# Patient Record
Sex: Female | Born: 1945
Health system: Southern US, Community
[De-identification: ages and names within clinical notes are randomized; demographics above are authoritative.]

## PROBLEM LIST (undated history)

## (undated) DIAGNOSIS — N3010 Interstitial cystitis (chronic) without hematuria: Secondary | ICD-10-CM

## (undated) DIAGNOSIS — I499 Cardiac arrhythmia, unspecified: Secondary | ICD-10-CM

## (undated) DIAGNOSIS — K5790 Diverticulosis of intestine, part unspecified, without perforation or abscess without bleeding: Secondary | ICD-10-CM

## (undated) DIAGNOSIS — I639 Cerebral infarction, unspecified: Secondary | ICD-10-CM

## (undated) DIAGNOSIS — T7840XA Allergy, unspecified, initial encounter: Secondary | ICD-10-CM

## (undated) DIAGNOSIS — R233 Spontaneous ecchymoses: Secondary | ICD-10-CM

## (undated) DIAGNOSIS — F909 Attention-deficit hyperactivity disorder, unspecified type: Secondary | ICD-10-CM

## (undated) DIAGNOSIS — IMO0001 Reserved for inherently not codable concepts without codable children: Secondary | ICD-10-CM

## (undated) DIAGNOSIS — J069 Acute upper respiratory infection, unspecified: Secondary | ICD-10-CM

## (undated) DIAGNOSIS — I313 Pericardial effusion (noninflammatory): Secondary | ICD-10-CM

## (undated) DIAGNOSIS — Z8719 Personal history of other diseases of the digestive system: Secondary | ICD-10-CM

## (undated) DIAGNOSIS — R112 Nausea with vomiting, unspecified: Secondary | ICD-10-CM

## (undated) DIAGNOSIS — Z9889 Other specified postprocedural states: Secondary | ICD-10-CM

## (undated) DIAGNOSIS — L253 Unspecified contact dermatitis due to other chemical products: Secondary | ICD-10-CM

## (undated) DIAGNOSIS — R0602 Shortness of breath: Secondary | ICD-10-CM

## (undated) DIAGNOSIS — G709 Myoneural disorder, unspecified: Secondary | ICD-10-CM

## (undated) DIAGNOSIS — T8859XA Other complications of anesthesia, initial encounter: Secondary | ICD-10-CM

## (undated) DIAGNOSIS — K222 Esophageal obstruction: Secondary | ICD-10-CM

## (undated) DIAGNOSIS — Z950 Presence of cardiac pacemaker: Secondary | ICD-10-CM

## (undated) DIAGNOSIS — M1990 Unspecified osteoarthritis, unspecified site: Secondary | ICD-10-CM

## (undated) DIAGNOSIS — T4145XA Adverse effect of unspecified anesthetic, initial encounter: Secondary | ICD-10-CM

## (undated) DIAGNOSIS — G909 Disorder of the autonomic nervous system, unspecified: Secondary | ICD-10-CM

## (undated) DIAGNOSIS — C50919 Malignant neoplasm of unspecified site of unspecified female breast: Secondary | ICD-10-CM

## (undated) DIAGNOSIS — R238 Other skin changes: Secondary | ICD-10-CM

## (undated) DIAGNOSIS — F329 Major depressive disorder, single episode, unspecified: Secondary | ICD-10-CM

## (undated) DIAGNOSIS — R079 Chest pain, unspecified: Secondary | ICD-10-CM

## (undated) DIAGNOSIS — K439 Ventral hernia without obstruction or gangrene: Secondary | ICD-10-CM

## (undated) DIAGNOSIS — Z5189 Encounter for other specified aftercare: Secondary | ICD-10-CM

## (undated) DIAGNOSIS — K219 Gastro-esophageal reflux disease without esophagitis: Secondary | ICD-10-CM

## (undated) DIAGNOSIS — F32A Depression, unspecified: Secondary | ICD-10-CM

## (undated) DIAGNOSIS — K2970 Gastritis, unspecified, without bleeding: Secondary | ICD-10-CM

## (undated) DIAGNOSIS — K226 Gastro-esophageal laceration-hemorrhage syndrome: Secondary | ICD-10-CM

## (undated) DIAGNOSIS — I3139 Other pericardial effusion (noninflammatory): Secondary | ICD-10-CM

## (undated) DIAGNOSIS — E039 Hypothyroidism, unspecified: Secondary | ICD-10-CM

## (undated) DIAGNOSIS — R001 Bradycardia, unspecified: Secondary | ICD-10-CM

## (undated) DIAGNOSIS — E079 Disorder of thyroid, unspecified: Secondary | ICD-10-CM

## (undated) HISTORY — PX: BLADDER SUSPENSION: SHX72

## (undated) HISTORY — DX: Malignant neoplasm of unspecified site of unspecified female breast: C50.919

## (undated) HISTORY — PX: BACK SURGERY: SHX140

## (undated) HISTORY — DX: Diverticulosis of intestine, part unspecified, without perforation or abscess without bleeding: K57.90

## (undated) HISTORY — DX: Depression, unspecified: F32.A

## (undated) HISTORY — DX: Reserved for inherently not codable concepts without codable children: IMO0001

## (undated) HISTORY — DX: Encounter for other specified aftercare: Z51.89

## (undated) HISTORY — DX: Allergy, unspecified, initial encounter: T78.40XA

## (undated) HISTORY — PX: PACEMAKER INSERTION: SHX728

## (undated) HISTORY — PX: NECK SURGERY: SHX720

## (undated) HISTORY — PX: TOTAL HIP ARTHROPLASTY: SHX124

## (undated) HISTORY — DX: Unspecified contact dermatitis due to other chemical products: L25.3

## (undated) HISTORY — DX: Gastritis, unspecified, without bleeding: K29.70

## (undated) HISTORY — DX: Disorder of thyroid, unspecified: E07.9

## (undated) HISTORY — DX: Gastro-esophageal reflux disease without esophagitis: K21.9

## (undated) HISTORY — DX: Interstitial cystitis (chronic) without hematuria: N30.10

## (undated) HISTORY — PX: ULNAR NERVE TRANSPOSITION: SHX2595

## (undated) HISTORY — PX: TONSILLECTOMY: SUR1361

## (undated) HISTORY — DX: Esophageal obstruction: K22.2

## (undated) HISTORY — PX: CYSTOSCOPY: SUR368

## (undated) HISTORY — DX: Unspecified osteoarthritis, unspecified site: M19.90

## (undated) HISTORY — PX: MASTECTOMY MODIFIED RADICAL: SUR848

## (undated) HISTORY — DX: Cerebral infarction, unspecified: I63.9

## (undated) HISTORY — PX: RECTOCELE REPAIR: SHX761

## (undated) HISTORY — DX: Gastro-esophageal laceration-hemorrhage syndrome: K22.6

## (undated) HISTORY — PX: EYE SURGERY: SHX253

## (undated) HISTORY — DX: Attention-deficit hyperactivity disorder, unspecified type: F90.9

## (undated) HISTORY — DX: Major depressive disorder, single episode, unspecified: F32.9

## (undated) HISTORY — DX: Ventral hernia without obstruction or gangrene: K43.9

---
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
## 1971-05-15 HISTORY — PX: LAPAROSCOPY: SHX197

## 1971-05-15 HISTORY — PX: TYMPANOPLASTY: SHX33

## 1972-05-14 HISTORY — PX: ABDOMINAL HYSTERECTOMY: SHX81

## 1980-05-14 HISTORY — PX: OOPHORECTOMY: SHX86

## 1999-07-06 ENCOUNTER — Other Ambulatory Visit: Admission: RE | Admit: 1999-07-06 | Discharge: 1999-07-06 | Payer: Self-pay | Admitting: *Deleted

## 2000-05-14 HISTORY — PX: BREAST BIOPSY: SHX20

## 2000-06-13 ENCOUNTER — Other Ambulatory Visit: Admission: RE | Admit: 2000-06-13 | Discharge: 2000-06-13 | Payer: Self-pay | Admitting: Internal Medicine

## 2000-06-13 ENCOUNTER — Encounter (INDEPENDENT_AMBULATORY_CARE_PROVIDER_SITE_OTHER): Payer: Self-pay | Admitting: Specialist

## 2000-07-10 ENCOUNTER — Other Ambulatory Visit: Admission: RE | Admit: 2000-07-10 | Discharge: 2000-07-10 | Payer: Self-pay | Admitting: *Deleted

## 2001-05-28 ENCOUNTER — Encounter: Payer: Self-pay | Admitting: Internal Medicine

## 2001-05-28 ENCOUNTER — Encounter: Admission: RE | Admit: 2001-05-28 | Discharge: 2001-05-28 | Payer: Self-pay | Admitting: Internal Medicine

## 2001-06-24 ENCOUNTER — Ambulatory Visit (HOSPITAL_COMMUNITY): Admission: RE | Admit: 2001-06-24 | Discharge: 2001-06-24 | Payer: Self-pay | Admitting: Internal Medicine

## 2001-08-18 ENCOUNTER — Other Ambulatory Visit: Admission: RE | Admit: 2001-08-18 | Discharge: 2001-08-18 | Payer: Self-pay | Admitting: *Deleted

## 2003-08-12 ENCOUNTER — Encounter: Admission: RE | Admit: 2003-08-12 | Discharge: 2003-11-10 | Payer: Self-pay | Admitting: Neurology

## 2003-11-11 ENCOUNTER — Encounter: Admission: RE | Admit: 2003-11-11 | Discharge: 2003-12-12 | Payer: Self-pay | Admitting: Neurology

## 2003-12-13 ENCOUNTER — Encounter: Admission: RE | Admit: 2003-12-13 | Discharge: 2004-03-12 | Payer: Self-pay | Admitting: Neurology

## 2005-02-12 ENCOUNTER — Ambulatory Visit: Payer: Self-pay | Admitting: Internal Medicine

## 2005-02-23 ENCOUNTER — Ambulatory Visit: Payer: Self-pay | Admitting: Internal Medicine

## 2005-05-28 ENCOUNTER — Ambulatory Visit (HOSPITAL_BASED_OUTPATIENT_CLINIC_OR_DEPARTMENT_OTHER): Admission: RE | Admit: 2005-05-28 | Discharge: 2005-05-28 | Payer: Self-pay | Admitting: Urology

## 2006-01-09 ENCOUNTER — Encounter: Admission: RE | Admit: 2006-01-09 | Discharge: 2006-01-09 | Payer: Self-pay | Admitting: Neurology

## 2006-02-11 ENCOUNTER — Encounter: Admission: RE | Admit: 2006-02-11 | Discharge: 2006-05-02 | Payer: Self-pay | Admitting: Neurology

## 2006-02-20 ENCOUNTER — Encounter: Admission: RE | Admit: 2006-02-20 | Discharge: 2006-02-20 | Payer: Self-pay | Admitting: Neurology

## 2006-03-29 ENCOUNTER — Encounter: Admission: RE | Admit: 2006-03-29 | Discharge: 2006-03-29 | Payer: Self-pay | Admitting: Neurology

## 2006-04-10 ENCOUNTER — Ambulatory Visit: Payer: Self-pay | Admitting: Internal Medicine

## 2006-05-17 ENCOUNTER — Encounter: Admission: RE | Admit: 2006-05-17 | Discharge: 2006-08-15 | Payer: Self-pay | Admitting: Neurology

## 2006-06-03 ENCOUNTER — Encounter: Admission: RE | Admit: 2006-06-03 | Discharge: 2006-06-03 | Payer: Self-pay | Admitting: Neurology

## 2006-07-18 ENCOUNTER — Inpatient Hospital Stay (HOSPITAL_COMMUNITY): Admission: RE | Admit: 2006-07-18 | Discharge: 2006-07-19 | Payer: Self-pay | Admitting: Neurological Surgery

## 2006-10-28 ENCOUNTER — Ambulatory Visit (HOSPITAL_COMMUNITY): Admission: RE | Admit: 2006-10-28 | Discharge: 2006-10-28 | Payer: Self-pay | Admitting: Neurological Surgery

## 2007-07-30 ENCOUNTER — Ambulatory Visit: Payer: Self-pay | Admitting: Internal Medicine

## 2007-08-19 ENCOUNTER — Ambulatory Visit: Payer: Self-pay | Admitting: Internal Medicine

## 2007-08-19 ENCOUNTER — Ambulatory Visit (HOSPITAL_COMMUNITY): Admission: RE | Admit: 2007-08-19 | Discharge: 2007-08-19 | Payer: Self-pay | Admitting: Internal Medicine

## 2007-08-19 ENCOUNTER — Encounter: Payer: Self-pay | Admitting: Internal Medicine

## 2007-08-21 ENCOUNTER — Ambulatory Visit: Payer: Self-pay | Admitting: Internal Medicine

## 2007-08-27 ENCOUNTER — Ambulatory Visit: Payer: Self-pay | Admitting: Cardiology

## 2007-12-05 ENCOUNTER — Encounter: Payer: Self-pay | Admitting: Internal Medicine

## 2009-02-09 ENCOUNTER — Emergency Department (HOSPITAL_COMMUNITY): Admission: EM | Admit: 2009-02-09 | Discharge: 2009-02-09 | Payer: Self-pay | Admitting: Emergency Medicine

## 2009-02-21 ENCOUNTER — Encounter: Payer: Self-pay | Admitting: Pulmonary Disease

## 2009-02-22 ENCOUNTER — Encounter: Payer: Self-pay | Admitting: Cardiology

## 2009-03-02 ENCOUNTER — Encounter: Payer: Self-pay | Admitting: Pulmonary Disease

## 2010-01-18 ENCOUNTER — Encounter (INDEPENDENT_AMBULATORY_CARE_PROVIDER_SITE_OTHER): Payer: Self-pay | Admitting: *Deleted

## 2010-03-03 ENCOUNTER — Encounter: Payer: Self-pay | Admitting: Pulmonary Disease

## 2010-04-25 ENCOUNTER — Encounter: Payer: Self-pay | Admitting: Pulmonary Disease

## 2010-05-05 DIAGNOSIS — M1990 Unspecified osteoarthritis, unspecified site: Secondary | ICD-10-CM | POA: Insufficient documentation

## 2010-05-05 DIAGNOSIS — M949 Disorder of cartilage, unspecified: Secondary | ICD-10-CM

## 2010-05-05 DIAGNOSIS — K449 Diaphragmatic hernia without obstruction or gangrene: Secondary | ICD-10-CM | POA: Insufficient documentation

## 2010-05-05 DIAGNOSIS — M899 Disorder of bone, unspecified: Secondary | ICD-10-CM | POA: Insufficient documentation

## 2010-05-05 DIAGNOSIS — C50919 Malignant neoplasm of unspecified site of unspecified female breast: Secondary | ICD-10-CM | POA: Insufficient documentation

## 2010-05-05 DIAGNOSIS — J45909 Unspecified asthma, uncomplicated: Secondary | ICD-10-CM | POA: Insufficient documentation

## 2010-05-09 ENCOUNTER — Ambulatory Visit: Admit: 2010-05-09 | Payer: Self-pay | Admitting: Pulmonary Disease

## 2010-05-09 ENCOUNTER — Encounter: Payer: Self-pay | Admitting: Pulmonary Disease

## 2010-05-09 ENCOUNTER — Ambulatory Visit: Payer: Self-pay | Admitting: Pulmonary Disease

## 2010-05-09 DIAGNOSIS — R0602 Shortness of breath: Secondary | ICD-10-CM | POA: Insufficient documentation

## 2010-05-10 ENCOUNTER — Ambulatory Visit: Payer: Self-pay | Admitting: Cardiology

## 2010-06-13 NOTE — Letter (Signed)
Summary: Colonoscopy Letter  [facility]  [address] [place], [place] [address]   Phone: [phone]  Fax: [phone]      January 18, 2010 MRN: [id number]   [facility] [address] [place], [place]  [address]   Dear Ms. Fisher,   According to your medical record, it is time for you to schedule a Colonoscopy. The American Cancer Society recommends this procedure as a method to detect early colon cancer. Patients with a family history of colon cancer, or a personal history of colon polyps or inflammatory bowel disease are at increased risk.  This letter has beeen generated based on the recommendations made at the time of your procedure. If you feel that in your particular situation this may no longer apply, please contact our office.  Please call our office at [phone] to schedule this appointment or to update your records at your earliest convenience.  Thank you for cooperating with [place] to provide you with the very best care possible.   Sincerely,  Hedwig Morton. Juanda Chance, M.D.  [facility] [phone]

## 2010-06-15 NOTE — Assessment & Plan Note (Signed)
Summary: asthma, sob/jd   Visit Type:  Initial Consult Copy to:  pcp Primary Provider/Referring Provider:  Dr. Geoffry Paradise  CC:  Pulmonary consult and SOB.  History of Present Illness: 65/F, never smoker for evaluation of dyspnea & asthma. She was working out with her trainer at [employer] when her  hr jumped to 160/m - nmlly low bp - seen by cards/ [place] - Evaluation incl nml holter, echo & stress nuclear study. She reached a HR of 135 - 80% max - but she feels that she was not exerted enough - she reports palpitations when she goes up a flight of stairs. She had an extensive WU for MS She received allergy shots  - kozlow x 5 y, nasonex, deviated septum, enlarged turbinates 'Asthma' was diagnosed as a young adult, appears mild 'intermittent ' nature- started as hives, never used epi pen, 'closing up' , relieved by maxair MDI , wheezing  on exposure to pine needles On  adderall 10 for ADHD. Spirometry did not show any airway obstruction. She did desaturate to 86% transiently on 3 rd lap around the office , Hr did not go up. I did not make her walk stairs due to her hip problem - ortho evaln ongoing.  Preventive Screening-Counseling & Management  Alcohol-Tobacco     Alcohol drinks/day: 2     Alcohol type: wine     Smoking Status: never  Current Medications (verified): 1)  Maxair Autohaler 200 Mcg/inh Aerb (Pirbuterol Acetate) .... As Directed As Needed 2)  Nasonex 50 Mcg/act Susp (Mometasone Furoate) .... 2 Sprays Each Nostril Once Daily 3)  Wellbutrin Xl 150 Mg Xr24h-Tab (Bupropion Hcl) .Marland Kitchen.. 1 Three Times A Day 4)  Maxzide-25 37.5-25 Mg Tabs (Triamterene-Hctz) .Marland Kitchen.. 1 Once Daily 5)  Adderall 20 Mg Tabs (Amphetamine-Dextroamphetamine) .... Take 1 Tablet By Mouth Once A Day 6)  Klonopin 1 Mg Tabs (Clonazepam) .... Take 1 Tablet By Mouth Three Times A Day 7)  Allegra Allergy 180 Mg Tabs (Fexofenadine Hcl) .... Take 1 Tablet By Mouth Once A Day 8)  Celexa 40 Mg Tabs (Citalopram  Hydrobromide) .... Take 1 Tablet By Mouth Once A Day 9)  Vivelle-Dot 0.025 Mg/24hr Pttw (Estradiol) .... Twice Weekly 10)  Estring 2 Mg Ring (Estradiol) .... Quarterly 11)  Nexium 40 Mg Cpdr (Esomeprazole Magnesium) .... Take 1 Capsule By Mouth Once A Day 12)  Synthroid 112 Mcg Tabs (Levothyroxine Sodium) .... Take 1 Tablet By Mouth Once A Day  Allergies (verified): 1)  ! Sulfa 2)  ! Cipro 3)  ! Macrodantin (Nitrofurantoin Macrocrystal) 4)  ! Codeine 5)  ! Demerol 6)  ! Pcn 7)  ! Morphine 8)  ! * Anesthesia  Past History:  Past Surgical History: Rectocele repair  Cystocele repair 1992 Hyterectomy  Oophorectomy 1982 Appendectomy Tubal ligation  Social History: Smoking Status:  never Alcohol drinks/day:  2  Review of Systems       The patient complains of shortness of breath with activity, acid heartburn, difficulty swallowing, nasal congestion/difficulty breathing through nose, and joint stiffness or pain.  The patient denies shortness of breath at rest, productive cough, non-productive cough, coughing up blood, chest pain, irregular heartbeats, indigestion, loss of appetite, weight change, abdominal pain, sore throat, tooth/dental problems, headaches, sneezing, itching, ear ache, anxiety, depression, hand/feet swelling, rash, change in color of mucus, and fever.    Vital Signs:  Patient profile:   65 year old female Height:      67 inches Weight:  140.8 pounds BMI:     22.13 O2 Sat:      100 % on Room air Temp:     97.7 degrees F oral Pulse rate:   74 / minute BP sitting:   98 / 52  (left arm) Cuff size:   regular  Vitals Entered By: Zackery Barefoot CMA (May 09, 2010 9:39 AM)  O2 Flow:  Room air  Serial Vital Signs/Assessments:  Comments: Ambulatory Pulse Oximetry  Resting; HR__75___    02 Sat__100%RA___  Lap1 (185 feet)   HR__92___   02 Sat__100%RA___ Lap2 (185 feet)   HR__94___   02 Sat__100%RA___    Lap3 (185 feet)   HR___95__   02  Sat__86%RA___  _x__Test Completed without Difficulty (Pt did desat to 86% RA at end of walk and recovered quickly to 100% RA once seated) Zackery Barefoot CMA  May 09, 2010 10:44 AM  ___Test Stopped due to:   By: Zackery Barefoot CMA   CC: Pulmonary consult, SOB Comments Medications reviewed with patient Verified contact number and pharmacy with patient Zackery Barefoot CMA  May 09, 2010 9:41 AM    Physical Exam  Additional Exam:  Gen. Pleasant, thin, in no distress, normal affect ENT - no lesions, no post nasal drip Neck: No JVD, no thyromegaly, no carotid bruits Lungs: no use of accessory muscles, no dullness to percussion, clear without rales or rhonchi  Cardiovascular: Rhythm regular, heart sounds  normal, no murmurs or gallops, no peripheral edema Abdomen: soft and non-tender, no hepatosplenomegaly, BS normal. Musculoskeletal: No deformities, no cyanosis or clubbing Neuro:  alert, non focal     Impression & Recommendations:  Problem # 1:  DYSPNEA (ICD-786.05) No obvious cause Cards evaln has been nml, spirometry nml Desaturation unexplained - very transient Due to her recent hip issues, will obtain CT angio to r/o PE although pre test probability is small, no other reason found. Orders: Consultation Level IV ([address]) Radiology Referral (Radiology)  Problem # 2:  ASTHMA (ICD-493.90) Mild intermittent No airway obstruction on spiroemtry today No reason to add inhaled steroids.  Medications Added to Medication List This Visit: 1)  Adderall 20 Mg Tabs (Amphetamine-dextroamphetamine) .... Take 1 tablet by mouth once a day 2)  Klonopin 1 Mg Tabs (Clonazepam) .... Take 1 tablet by mouth three times a day 3)  Allegra Allergy 180 Mg Tabs (Fexofenadine hcl) .... Take 1 tablet by mouth once a day 4)  Celexa 40 Mg Tabs (Citalopram hydrobromide) .... Take 1 tablet by mouth once a day 5)  Vivelle-dot 0.025 Mg/24hr Pttw (Estradiol) .... Twice weekly 6)  Estring 2 Mg  Ring (Estradiol) .... Quarterly 7)  Nexium 40 Mg Cpdr (Esomeprazole magnesium) .... Take 1 capsule by mouth once a day 8)  Synthroid 112 Mcg Tabs (Levothyroxine sodium) .... Take 1 tablet by mouth once a day  Other Orders: T-2 View CXR ([id number])  Patient Instructions: 1)  Copy sent to: dr aronson, [place] 2)  Please schedule a follow-up appointment as needed. 3)  Breathing test ok - no reason to increase asthma meds 4)  A Spiral CT of the Chest has been recommended.  Your imaging study may require preauthorization.    Immunization History:  Influenza Immunization History:    Influenza:  historical (03/20/2010)  Zostavax History:    Zostavax # 1:  zostavax (04/19/2008)     Appended Document: Orders Update     Clinical Lists Changes  Orders: Added new Service order of Spirometry w/Graph (94010) - Signed

## 2010-06-15 NOTE — Procedures (Signed)
Summary: CardioNet  CardioNet   Imported By: Sherian Rein 05/18/2010 13:57:21  _____________________________________________________________________  External Attachment:    Type:   Image     Comment:   External Document

## 2010-07-05 ENCOUNTER — Ambulatory Visit: Payer: Self-pay | Admitting: Pulmonary Disease

## 2010-07-27 ENCOUNTER — Ambulatory Visit (INDEPENDENT_AMBULATORY_CARE_PROVIDER_SITE_OTHER): Payer: Medicare Other | Admitting: Pulmonary Disease

## 2010-07-27 ENCOUNTER — Encounter: Payer: Self-pay | Admitting: Pulmonary Disease

## 2010-07-27 DIAGNOSIS — R0602 Shortness of breath: Secondary | ICD-10-CM

## 2010-07-27 DIAGNOSIS — J45909 Unspecified asthma, uncomplicated: Secondary | ICD-10-CM

## 2010-08-01 NOTE — Assessment & Plan Note (Signed)
Summary: 1 month f/u appt/mg   Visit Type:  Follow-up Copy to:  pcp Primary Provider/Referring Provider:  Dr. Geoffry Paradise  CC:  Pt is here for follow-up...still having SOB when exerting...she denies any cough or wheezing...c/o sinus drainage that is clear.  History of Present Illness: 65/F, never smoker for FU of dyspnea & asthma. She was working out with her trainer at [employer] when her  hr jumped to 160/m - nmlly low bp - seen by cards/ [place] - Evaluation incl nml holter, echo & stress nuclear study. She reached a HR of 135 - 80% max - but she feels that she was not exerted enough - she reports palpitations when she goes up a flight of stairs. She had an extensive WU for MS She received allergy shots  - kozlow x 5 y, nasonex, deviated septum, enlarged turbinates 'Asthma' was diagnosed as a young adult, appears mild 'intermittent ' nature- started as hives, never used epi pen, 'closing up' , relieved by maxair MDI , wheezing  on exposure to pine needles On  adderall 10 for ADHD. Spirometry did not show any airway obstruction. She did desaturate to 86% transiently on 3 rd lap around the office , Hr did not go up. I did not make her walk stairs due to her hip problem - ortho evaln ongoing.  July 27, 2010 4:03 PM  Hip is hurting again -time for cortisone shot Sister passed of pancreatic CA. Had episodes of dyspnea while climbing stairs. Not needed rescue inhaler too often. Hr did not change with walking around the office but went from 94 to 120 on lcimbing 1 flight of stairs & O2 satn did not change, she was symptomatic  Preventive Screening-Counseling & Management  Alcohol-Tobacco     Smoking Status: never  Current Medications (verified): 1)  Maxair Autohaler 200 Mcg/inh Aerb (Pirbuterol Acetate) .... As Directed As Needed 2)  Nasonex 50 Mcg/act Susp (Mometasone Furoate) .... 2 Sprays Each Nostril Once Daily 3)  Wellbutrin Xl 150 Mg Xr24h-Tab (Bupropion Hcl) .Marland Kitchen.. 1 Three Times A  Day 4)  Maxzide-25 37.5-25 Mg Tabs (Triamterene-Hctz) .Marland Kitchen.. 1 Once Daily 5)  Adderall 20 Mg Tabs (Amphetamine-Dextroamphetamine) .... Take 1 Tablet By Mouth Once A Day 6)  Klonopin 1 Mg Tabs (Clonazepam) .Marland Kitchen.. 1 By Mouth Daily 7)  Allegra Allergy 180 Mg Tabs (Fexofenadine Hcl) .... Take 1 Tablet By Mouth Once A Day 8)  Celexa 40 Mg Tabs (Citalopram Hydrobromide) .... Take 1 Tablet By Mouth Once A Day 9)  Vivelle-Dot 0.025 Mg/24hr Pttw (Estradiol) .... Twice Weekly 10)  Estring 2 Mg Ring (Estradiol) .... Quarterly 11)  Nexium 40 Mg Cpdr (Esomeprazole Magnesium) .... Take 1 Capsule By Mouth Once A Day 12)  Synthroid 112 Mcg Tabs (Levothyroxine Sodium) .... Take 1 Tablet By Mouth Once A Day  Allergies (verified): 1)  ! Sulfa 2)  ! Cipro 3)  ! Macrodantin (Nitrofurantoin Macrocrystal) 4)  ! Codeine 5)  ! Demerol 6)  ! Pcn 7)  ! Morphine 8)  ! 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 Michel Bickers CMA  July 27, 2010 3:42 PM   Physical Exam  Additional Exam:   Gen. Pleasant, thin, in no distress, normal affect ENT - no lesions, no post nasal drip Neck: No JVD, no thyromegaly, no carotid bruits Lungs: no use of accessory muscles, no dullness to percussion, clear without rales or rhonchi  Cardiovascular: Rhythm regular, heart sounds  normal, no murmurs or gallops, no peripheral edema Musculoskeletal: No deformities, no cyanosis or clubbing     CT of Chest  Procedure date:  05/10/2010  Findings:      IMPRESSION:  1. No evidence of pulmonary embolism. 2.  Probable atelectasis in the dependent right upper lobe. Minimal infection is felt less likely.  Correlate  with infectious symptoms.  Impression & Recommendations:  Problem # 1:  DYSPNEA (ICD-786.05)  Likely related to deconditioning with nml cardiopulmonary work -up Asthma does not appear to be worse, no evidence of PE Have encouraged to improve her conditioning  Orders: Est. Patient Level III (56213)  Problem # 2:  ASTHMA (ICD-493.90) rescue inhaler as needed only  Medications Added to Medication List This Visit: 1)  Klonopin 1 Mg Tabs (Clonazepam) .Marland Kitchen.. 1 by mouth daily  Patient Instructions: 1)  Copy sent to: Dr [name], Dr [place] 2)  Please schedule a follow-up appointment as needed. 3)  Your oxygen level did not drop while walking today & your heart rate went up while walking stairs - may indicate deconditioning 4)  No further tests or medications needed at thsi time  Appended Document: 1 month f/u appt/mg Ambulatory Pulse Oximetry  Resting; HR__76___    02 Sat__100%RA___  Lap1 (185 feet)   HR__92___   02 Sat__92%RA___ Lap2 (185 feet)   HR__97___   02 Sat__94%RA___    Lap3 (185 feet)   HR__94___   02 Sat__95%RA___  _x__Test Completed without Difficulty ___Test Stopped due to:  Resting O2-100%RA, P-86. Then walked pt up 2 flights of stairs, pt c/o extreme SOB and chest tightness, O2-99% P-120.

## 2010-08-18 LAB — CBC
Hemoglobin: 14.5 g/dL (ref 12.0–15.0)
MCHC: 34.7 g/dL (ref 30.0–36.0)
RBC: 4.45 MIL/uL (ref 3.87–5.11)
RDW: 13.3 % (ref 11.5–15.5)

## 2010-08-18 LAB — POCT CARDIAC MARKERS
CKMB, poc: 1.2 ng/mL (ref 1.0–8.0)
CKMB, poc: <1 ng/mL — ABNORMAL LOW (ref 1.0–8.0)
Myoglobin, poc: 175 ng/mL (ref 12–200)
Troponin i, poc: <0.05 ng/mL (ref 0.00–0.09)
Troponin i, poc: <0.05 ng/mL (ref 0.00–0.09)

## 2010-08-18 LAB — POCT I-STAT, CHEM 8
BUN: 15 mg/dL (ref 6–23)
Calcium, Ion: 1.05 mmol/L — ABNORMAL LOW (ref 1.12–1.32)
Chloride: 102 mEq/L (ref 96–112)
Glucose, Bld: 85 mg/dL (ref 70–99)
HCT: 45 % (ref 36.0–46.0)
Potassium: 3.7 mEq/L (ref 3.5–5.1)

## 2010-08-18 LAB — D-DIMER, QUANTITATIVE: D-Dimer, Quant: <0.22 ug/mL-FEU (ref 0.00–0.48)

## 2010-08-18 LAB — DIFFERENTIAL
Basophils Absolute: 0 10*3/uL (ref 0.0–0.1)
Basophils Relative: 0 % (ref 0–1)
Lymphocytes Relative: 29 % (ref 12–46)
Monocytes Absolute: 0.5 10*3/uL (ref 0.1–1.0)
Neutro Abs: 3.4 10*3/uL (ref 1.7–7.7)
Neutrophils Relative %: 62 % (ref 43–77)

## 2010-09-26 NOTE — Assessment & Plan Note (Signed)
[facility]                         GASTROENTEROLOGY OFFICE NOTE   NAME:STERNBERGJules, Vidovich                 MRN:          [id number]  DATE:07/30/2007                            DOB:          14-Dec-1945    Ms. Podgurski is a very nice 64 year old white female, whom we have  followed in the past for colorectal screening.  She has a positive  family history of colon cancer in her mother and has a personal history  of diverticulosis of the left colon.  Last colonoscopy was done in  October of 2006.  She has a history of breast cancer and  gastroesophageal reflux disease, as well.  Since last year, she has had  series of orthopedic problems, cervical laminectomy by Dr. Danielle Dess, and  most recently a spine injury, where she fractured the sacrum and was  diagnosed with degenerative joint disease of the lumbosacral spine.  She  has been in a lot of pain and has complained of increased pain in the  left upper quadrant and epigastrium from taking ibuprofen 800 mg twice a  day.  She denies any vomiting, but has had some solid-food dysphagia.  She has pain with eating and early satiety.  Last upper endoscopy was  done in October 2006, prior endoscopies in February 2001 and February  2003, which showed mild esophageal stricture.  She had to be dilated  with [facility] dilator to #48 [place].   MEDICATIONS:  1. Nexium 40 mg p.o. b.i.d.  2. Librax p.r.n.  This seemed to relieve some of her discomfort.  3. Restasis.  4. Allergy shots.  5. Vivelle patch.  6. Elmiron.  7. Fexofenadine 180 mg daily.  8. Levothyroxine 88 mcg daily.  9. Adderall XR 10 mg daily.  10.Celexa 40 mg daily.  11.Clonazepam 0.5 mg a half tablet t.i.d.  12.She is on Budeprion XL 150 mg one or two a day.  13.Triamterene 37.5/25 daily.  14.Gabapentin 300 mg q.h.s.  15.Estrin.  16.Vitamin D.   PHYSICAL EXAM:  Blood pressure 110/68, pulse 70 and weight 141 pounds.  Prior was 149 pounds.  She  is alert and oriented, in no distress.  LUNGS:  With normal breath sounds.  COR:  With quiet precordium, normal S1, normal S2.  ABDOMEN:  Soft, but very tender in epigastrium and to the left of the  epigastrium, along the left costal margin.  Bowel sounds were  normoactive.  There was no succussion splash.  Lower abdomen was normal.  She had tenderness in right sacral area.   IMPRESSION:  A 65 year old white female with exacerbation of  gastroesophageal reflux and abdominal pain, as a result of having series  of orthopedic injuries and surgeries, necessitating use of NSAIDs, as  well as patient's having to lie down and aggravating her reflux.  We  need to rule out possibility of gastric outlet obstruction, causing her  early satiety, or possibly NSAID-related ulceration.  Her dysphagia may  be due to progressive benign distal esophageal stricture.   PLAN:  1. Add Carafate slurry 10 mL q.i.d.  2. Cut back on the ibuprofen.  3. Upper endoscopy scheduled.  4.  Continue Nexium 40 mg p.o. b.i.d.  Samples given.   Prescriptions sent electronically to [facility].     Hedwig Morton. Juanda Chance, MD  Electronically Signed    DMB/MedQ  DD: 07/30/2007  DT: 07/30/2007  Job #: [id number]   cc:   Stefani Dama, M.D.  Geoffry Paradise, M.D.

## 2010-09-29 NOTE — Op Note (Signed)
NAMESECILY, WALTHOUR          ACCOUNT NO.:  [id number]   MEDICAL RECORD NO.:  [id number]          PATIENT TYPE:  INP   LOCATION:  3172                         FACILITY:  [facility]   PHYSICIAN:  Stefani Dama, M.D.  DATE OF BIRTH:  May 03, 1946   DATE OF PROCEDURE:  07/18/2006  DATE OF DISCHARGE:                               OPERATIVE REPORT   PREOPERATIVE DIAGNOSIS:  Cervical spondylosis C5-C6, with herniated  nucleus pulposus, spondylosis C6-C7 with left cervical radiculopathy, C6-  C7 distributions.   POSTOPERATIVE DIAGNOSIS:  Cervical spondylosis C5-C6, with herniated  nucleus pulposus, spondylosis C6-C7 with left cervical radiculopathy, C6-  C7 distributions.   PROCEDURES:  Anterior cervical decompression C5-6, C6-7; arthrodesis  with structural allograft; anterior plate fixation with Alphatec plate,  Z6-X0.   SURGEON:  Stefani Dama, M.D.   FIRST ASSISTANT:  Dr. Delma Officer.   ANESTHESIA:  General endotracheal.   INDICATIONS:  [name] a 65 year old individual who has had  significant neck and right shoulder and arm pain with weakness in the  biceps and triceps groups.  She also has weakness in her grip strength  on the right side.  She has profound spondylitic changes at C6-C7.  She  also has a spondylitic change with herniated nucleus pulposus noted at  C5-C6.  She had been advised regarding surgical decompression.   PROCEDURE:  The patient was brought to the operating room, placed on  table in supine position.  After smooth induction of general  endotracheal anesthesia, she was placed in 5 pounds of halter traction,  neck was prepped with DuraPrep and draped in sterile fashion.  A  transverse incision was made in the left side of neck and carried down  through the platysma.  The plane between the sternocleidomastoid and  strap muscles dissected bluntly until the prevertebral space was  reached.  The first identifiable disk space was noted to be C5-C6  on  localizing radiograph.  Then by stripping the longus coli muscle off the  ventral aspect the vertebral bodies across line of levels of C5-6 and C6-  7, the Caspar retractor was placed into the wound to maintain exposure.  The anterior longitudinal ligament at C5-6 was opened with a #15 blade  and a combination of curettes and rongeurs was used to evacuate a  significant quantity of degenerated disk material at both C5-6 and C6-7,  ventral osteophytes were came encountered at C5-6 and these were taken  down with a Behr rongeur and also with a Leksell rongeur.  A high-speed  drill was then used to evacuate the bony overgrowth and in the  interspace.  A self-retaining spreader was then placed in the wound to  allow better exposure of the posterior structures.  Significant  osteophyte from the inferior margin of body of C5 was encountered.  This  was drilled down with high-speed drill and 2.3-mm dissecting tool.  The  uncinate processes were hypertrophied particularly on the right side.  At C5-6 there was a combination of bony osteophytosis and disk  protrusion in the subligamentous space that was causing significant  foraminal stenosis.  This was decompressed to  allow easy visualization  and outflow of the C6 nerve root.  The left side was similarly  decompressed; however, here significant foraminotomy was not created.  The endplates were then drilled down smooth.  A similar procedure was  carried out at C6-C7, however osteophytosis was the main finding at the  right side.  Then bone grafts were prepared these were 8 mm TranZgrafts  that were shaved down to appropriate size and shape.  They were hollowed  out slightly and filled with demineralized bone matrix.  Allograft was  then placed into the interspace at C5-6, countersunk slightly. C6-7 had  a similar allograft placed.  This was also countersunk.  Then a 34 mm  standard size Alphatec plate was fitted to the ventral aspect of the   vertebral bodies with fixed angle locking screws measuring 4 x 14 mm in  C6, variable angle screws were put in C5 and C7 a final localizing  radiograph identified good fixation.  Ventral aspect of the wound was  irrigated copiously with antibiotic irrigating solution.  Hemostasis was  established well and then the platysma was closed 3-0 Vicryl interrupted  fashion, 2-0 Vicryl using subcutaneous, 3-0 Vicryl using subcuticular  tissues.  A dry sterile dressing was then placed on the wound.  The  patient tolerated procedure well.  Final localizing radiograph  identified good position of the surgical construct.  The patient was  then returned to recovery room in stable condition.      Stefani Dama, M.D.  Electronically Signed     HJE/MEDQ  D:  07/18/2006  T:  07/18/2006  Job:  [id number]

## 2010-09-29 NOTE — Op Note (Signed)
NAME:  [name], [name]          ACCOUNT NO.:  [id number]   MEDICAL RECORD NO.:  [id number]          PATIENT TYPE:  AMB   LOCATION:  [facility]                         FACILITY[facility]  [facility]   PHYSICIAN:  Sigmund I. Patsi Sears, M.D.DATE OF BIRTH:  1946-04-21   DATE OF PROCEDURE:  05/28/2005  DATE OF DISCHARGE:                                 OPERATIVE REPORT   PREOPERATIVE DIAGNOSIS:  Interstitial cystitis.   POSTOPERATIVE DIAGNOSIS:  Interstitial cystitis.   OPERATION:  Cystourethroscopy, hydrodistention of the bladder, insertion of  Clorpactin x3 minutes.  Insertion of Marcaine and Pyridium in the bladder,  injection of Marcaine and Kenalog in the subtrigonal space.   SURGEON:  Sigmund I. Patsi Sears, M.D.   ANESTHESIA:  LMA.   PREPARATION:  After the appropriate preanesthesia, the patient was brought  to the operating room and placed on the operating room table in the dorsal  supine position, where a general LMA anesthesia was introduced.  She was  then replaced in a dorsal lithotomy position, where her pubis was prepped  with Betadine solution and draped in the usual fashion.   REVIEW OF HISTORY:  This 65 year old female has a history of multiple  medical problems with the pelvic pain, history of IC, thought to have an IC  flare.  She has a negative GYN examination and an interstitial cystitis puff  score of 18.   PROCEDURE:  With the patient in the lithotomy position, the pubis was  prepped with Betadine solution and draped in the usual fashion.  Note the  patient is allergic to LATEX.   Cystourethroscopy was accomplished, and the bladder was noted to hold 775  cc.  It was hydrodistended to 825 cc.  The bladder wall appeared normal with  minimal ulcerations.   Clorpactin was placed in the bladder for three minutes, then irrigated free  from the bladder.  Following this, Pyridium and Marcaine solution was  inserted into the bladder, and Marcaine/Kenalog solution was then  injected  in the base of the bladder.  The patient was then awakened and taken to the  recovery room in good condition.      Sigmund I. Patsi Sears, M.D.  Electronically Signed     SIT/MEDQ  D:  05/28/2005  T:  05/28/2005  Job:  [id number]

## 2010-09-29 NOTE — Assessment & Plan Note (Signed)
[facility]                         GASTROENTEROLOGY OFFICE NOTE   NAME:Reynolds, Courtney ROSSI                 MRN:          [id number]  DATE:04/10/2006                            DOB:          04/08/1946    [name] is a delightful 65 year old female patient of Dr. [name],  whom he followed for gastroesophageal reflux disease, benign distal  esophageal stricture last time dilated in October 2006.  She also has  risk factors for colon cancer with a family history of colon cancer in  her mother and known diverticulosis of the left colon.  Her last  colonoscopy October 2006.  Next colonoscopy planned for October 2011.  She comes for refill of her medications, as well as for evaluation of  rectal bleeding.  She has seen bright red blood per rectum yesterday and  on several previous occasions without associated rectal pain.   MEDICATIONS:  1. Allergy shots.  2. Nexium 40 mg p.o. b.i.d.   1. Testosterone cream 2% daily.  2. Elmiron 100 mg p.o. t.i.d.  3. Fexofenadine 180 mg p.o. daily.  4. Levothyroxine 88 mcg daily.  5. Adderall XR 10 mg daily.  6. Celexa 40 mg daily.  7. Clonazepam 0.5 mg half tablet t.i.d.   PHYSICAL EXAM:  Blood pressure 118/60, pulse 64, weight 141 pounds.  She was alert, oriented, and in no acute distress.  LUNGS:  Clear to auscultation.  COR:  Normal S1, normal S2.  ABDOMEN:  Soft and nontender with normoactive bowel sounds.  No  distension.  No tenderness.  RECTAL AND ANOSCOPIC:  Normal perianal area, normal rectal tone.  First  grade internal hemorrhoids with visible bleeding of hemorrhoid within  the anal canal at 9 o'clock.  Stool itself was Hemoccult negative.   IMPRESSION:  1. Symptomatic hemorrhoids, first grade, exacerbation.  2. Gastroesophageal reflux disease, stable on Nexium 40 mg b.i.d.  No      evidence of recurrent esophageal stricture.   PLAN:  1. Continue Nexium 40 mg p.o. b.i.d.  2. Repeat  colonoscopy in October 2011.  3. Anusol HC suppositories at bedtime.  I will see her on a p.r.n.      basis.     [name]. Juanda Chance, MD  Electronically Signed    DMB/MedQ  DD: 04/10/2006  DT: 04/10/2006  Job #: [id number]   cc:   Geoffry Paradise, M.D.

## 2010-09-29 NOTE — Procedures (Signed)
[facility]  Patient:    [name], [name] Visit Number: [id number] MRN: [id number]          Service Type: END Location: ENDO Attending Physician:  Mervin Hack Dictated by:   Hedwig Morton. Juanda Chance, M.D. [facility] Proc. Date: 06/24/01 Admit Date:  06/24/2001   CC:         Richard A. [name], M.D.                           Procedure Report  PROCEDURE:  Upper endoscopy.  INDICATIONS FOR PROCEDURE:  This 65 year old white female has developed epigastric pain.  She was on a series of antibiotics for bronchitis, and also on Vioxx.  CT scan of the abdomen done one month ago was negative.  She does not smoke.  She has been under a great deal of stress.  She also has early satiety and pain after meals.  She also hurts at night.  Upper endoscopy on May 15, 2000, showed gastritis which was CLO negative.  Her ultrasound of the gallbladder was negative, although she does have a family history of gallbladder disease.  We have put her on Nexium b.i.d., as well as Librax q.a.m. with some improvement of her symptoms.  She is undergoing upper endoscopy to further evaluate her symptoms.  ENDOSCOPE:  Olympus single-channel video endoscope.  SEDATION: 1. Versed 10 mg IV. 2. Demerol 100 mg IV.  FINDINGS:  The Olympus single-channel video endoscope was passed into esophagus.  The patient was monitored by pulse oximeter, oxygen saturations were normal.  The patient had a ______ upper esophageal sphincter and could not be initially intubated.  A 42 French Maloney dilator passed easily through the upper esophageal sphincter and dilated it.  At that point, we were able to advance the endoscope through the upper esophageal sphincter into the esophagus which was essentially normal.  There was a fibrous ring 36 cm from the incisors which was suggestive of mild esophageal stricture.  The lumen was angulated, and extended into a hiatal hernia measuring 4 cm, extending  from 36 to 40 cm from the incisors.  STOMACH:  The stomach was insufflated with air, and showed normal appearing gastric mucosa, rugous folds, and gastric antrum with minimal erythema in the gastric antrum.  Pyloric outlet was normal.  Retroflexion of endoscope confirmed presence of hiatal hernia.  DUODENUM:  Duodenal bulb and descending duodenum were normal.  The endoscope was brought back into the stomach.  Biopsies were taken for CLO test. Following endoscopy, a Maloney dilator 50 French was passed through the entire esophagus through the esophageal stricture.  There was a small amount of blood on the dilator.  The patient tolerated the procedure well.  IMPRESSION: 1. Mild esophageal stricture with hiatal hernia, status post passage of 42    French Maloney dilator. 2. Status post CLO test.  PLAN: 1. The patient is to continue on Nexium 40 mg b.i.d. for another 2 weeks, then    q.d. 2. Increase Librax to one p.o. t.i.d. a.c.  This seems to be having a    beneficial symptomatic relief. Dictated by:   Hedwig Morton. Juanda Chance, M.D. [facility] Attending Physician:  Mervin Hack DD:  06/24/01 TD:  06/24/01 Job: [id number]

## 2010-11-13 ENCOUNTER — Other Ambulatory Visit: Payer: Self-pay | Admitting: Orthopaedic Surgery

## 2010-11-13 ENCOUNTER — Ambulatory Visit (HOSPITAL_COMMUNITY)
Admission: RE | Admit: 2010-11-13 | Discharge: 2010-11-13 | Disposition: A | Discharge status: Home / Self Care | Payer: Medicare Other | Source: Ambulatory Visit | Attending: Orthopaedic Surgery | Admitting: Orthopaedic Surgery

## 2010-11-13 ENCOUNTER — Encounter (HOSPITAL_COMMUNITY): Payer: Medicare Other

## 2010-11-13 ENCOUNTER — Other Ambulatory Visit (HOSPITAL_COMMUNITY): Payer: Self-pay | Admitting: Orthopaedic Surgery

## 2010-11-13 DIAGNOSIS — M169 Osteoarthritis of hip, unspecified: Secondary | ICD-10-CM | POA: Insufficient documentation

## 2010-11-13 DIAGNOSIS — Z01818 Encounter for other preprocedural examination: Secondary | ICD-10-CM

## 2010-11-13 DIAGNOSIS — Z01810 Encounter for preprocedural cardiovascular examination: Secondary | ICD-10-CM | POA: Insufficient documentation

## 2010-11-13 DIAGNOSIS — R9431 Abnormal electrocardiogram [ECG] [EKG]: Secondary | ICD-10-CM | POA: Insufficient documentation

## 2010-11-13 DIAGNOSIS — M1610 Unilateral primary osteoarthritis, unspecified hip: Secondary | ICD-10-CM | POA: Insufficient documentation

## 2010-11-13 DIAGNOSIS — Z01812 Encounter for preprocedural laboratory examination: Secondary | ICD-10-CM | POA: Insufficient documentation

## 2010-11-13 DIAGNOSIS — R0602 Shortness of breath: Secondary | ICD-10-CM | POA: Insufficient documentation

## 2010-11-13 LAB — CBC
MCH: 31.3 pg (ref 26.0–34.0)
MCHC: 33.5 g/dL (ref 30.0–36.0)
MCV: 93.3 fL (ref 78.0–100.0)
Platelets: 227 10*3/uL (ref 150–400)
RDW: 12.6 % (ref 11.5–15.5)

## 2010-11-13 LAB — BASIC METABOLIC PANEL
CO2: 29 mEq/L (ref 19–32)
Calcium: 10 mg/dL (ref 8.4–10.5)
Creatinine, Ser: 0.6 mg/dL (ref 0.50–1.10)
GFR calc Af Amer: >60 mL/min (ref >60)
GFR calc non Af Amer: >60 mL/min (ref >60)
Sodium: 136 mEq/L (ref 135–145)

## 2010-11-13 LAB — URINALYSIS, ROUTINE W REFLEX MICROSCOPIC
Bilirubin Urine: NEGATIVE
Hgb urine dipstick: NEGATIVE
Ketones, ur: NEGATIVE mg/dL
Nitrite: NEGATIVE
Protein, ur: NEGATIVE mg/dL
Urobilinogen, UA: 0.2 mg/dL (ref 0.0–1.0)

## 2010-11-13 LAB — PROTIME-INR: Prothrombin Time: 13 seconds (ref 11.6–15.2)

## 2010-11-13 LAB — SURGICAL PCR SCREEN: Staphylococcus aureus: NEGATIVE

## 2010-11-17 ENCOUNTER — Inpatient Hospital Stay (HOSPITAL_COMMUNITY): Payer: Medicare Other

## 2010-11-17 ENCOUNTER — Inpatient Hospital Stay (HOSPITAL_COMMUNITY)
Admission: RE | Admit: 2010-11-17 | Discharge: 2010-11-21 | DRG: 470 | Disposition: A | Discharge status: Home Health | Payer: Medicare Other | Source: Ambulatory Visit | Attending: Orthopaedic Surgery | Admitting: Orthopaedic Surgery

## 2010-11-17 DIAGNOSIS — M169 Osteoarthritis of hip, unspecified: Principal | ICD-10-CM | POA: Diagnosis present

## 2010-11-17 DIAGNOSIS — D62 Acute posthemorrhagic anemia: Secondary | ICD-10-CM | POA: Diagnosis not present

## 2010-11-17 DIAGNOSIS — Z01812 Encounter for preprocedural laboratory examination: Secondary | ICD-10-CM

## 2010-11-17 DIAGNOSIS — Z853 Personal history of malignant neoplasm of breast: Secondary | ICD-10-CM

## 2010-11-17 DIAGNOSIS — M1610 Unilateral primary osteoarthritis, unspecified hip: Principal | ICD-10-CM | POA: Diagnosis present

## 2010-11-17 DIAGNOSIS — E876 Hypokalemia: Secondary | ICD-10-CM | POA: Diagnosis not present

## 2010-11-17 LAB — TYPE AND SCREEN

## 2010-11-17 NOTE — H&P (Signed)
  [name], [name]          ACCOUNT NO.:  [id number]  MEDICAL RECORD NO.:  [id number]  LOCATION:  0005                         FACILITY:  [facility]  PHYSICIAN:  Vanita Panda. Magnus Ivan, M.D.DATE OF BIRTH:  03-04-1946  DATE OF ADMISSION:  11/17/2010 DATE OF DISCHARGE:                             HISTORY & PHYSICAL   PRIMARY CARE PHYSICIAN:  Richard A. [name], MD, of [facility]  CHIEF COMPLAINT:  Severe right hip pain.  HISTORY OF PRESENT ILLNESS:  [name] is a pleasant 65 year old female with complaints of pain with movement of the right hip.  This pain has been going on for over 6 months' now and has greatly affected her activities of daily living.  She has had previous steroid injections in the right hip which first helped her greatly but now it has not helped at all.  She had an MRI of this hip which showed moderate arthritis in the hip as well as a labral tear.  She saw a surgeon for consideration of arthroscopic right hip surgery but he felt she would benefit more from a hip replacement surgery.  She has multiple pain and drug medication allergies and she has had multiple surgeries in the past.  She understands the risks and benefits of total hip arthroplasty including the risk of acute blood loss anemia, bleeding, __________. She does wish to proceed with surgery.  PAST MEDICAL HISTORY: 1. Arthritis. 2. Asthma. 3. Multiple bladder surgeries. 4. Mastectomy secondary to breast cancer.  MEDICATIONS:  See medication reconciliation orders.  ALLERGIES:  See long list which includes CIPRO, PENICILLIN, CODEINE, MORPHINE, OXYCODONE, SULFA, DEMEROL, and SEVERAL ANTIBIOTICS.  REVIEW OF SYSTEMS:  Negative for chest pain, shortness of breath, fevers, chills, nausea or vomiting.  FAMILY HISTORY:  Heart disease and high blood pressure.  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  She drinks wine daily but does not  smoke.  PHYSICAL EXAMINATION:  VITAL SIGNS:  She is afebrile, stable vital signs. GENERAL:  She is alert and oriented x3, in no acute distress, has discomfort. HEENT:  Normocephalic, atraumatic.  Pupils equal, round, reactive to light.  Extraocular muscles intact. NECK:  Supple. LUNGS:  Clear to auscultation bilaterally. HEART:  Regular rate and rhythm. ABDOMEN:  Benign. EXTREMITIES:  Right hip:  She has severe pain with internal and external rotation.  Her leg movements are not equal.  X-rays confirmed moderate arthritis to the right hip.  ASSESSMENT:  This is a 65 year old female with painful arthritis of the right hip.  PLAN:  We will proceed with a right total hip arthroplasty today.  She understands, again, the risks and benefits of this as well.     Vanita Panda. Magnus Ivan, M.D.     CYB/MEDQ  D:  11/17/2010  T:  11/17/2010  Job:  [id number]  Electronically Signed by Doneen Poisson M.D. on 11/17/2010 04:54:09 PM

## 2010-11-18 LAB — BASIC METABOLIC PANEL
Chloride: 104 mEq/L (ref 96–112)
GFR calc Af Amer: >60 mL/min (ref >60)
GFR calc non Af Amer: >60 mL/min (ref >60)
Glucose, Bld: 140 mg/dL — ABNORMAL HIGH (ref 70–99)
Potassium: 4 mEq/L (ref 3.5–5.1)
Sodium: 136 mEq/L (ref 135–145)

## 2010-11-18 LAB — CBC
Hemoglobin: 10.1 g/dL — ABNORMAL LOW (ref 12.0–15.0)
RBC: 3.15 MIL/uL — ABNORMAL LOW (ref 3.87–5.11)

## 2010-11-19 LAB — CBC
HCT: 24.9 % — ABNORMAL LOW (ref 36.0–46.0)
MCV: 92.2 fL (ref 78.0–100.0)
RBC: 2.7 MIL/uL — ABNORMAL LOW (ref 3.87–5.11)
WBC: 8.2 10*3/uL (ref 4.0–10.5)

## 2010-11-19 LAB — BASIC METABOLIC PANEL
BUN: 13 mg/dL (ref 6–23)
CO2: 24 mEq/L (ref 19–32)
Chloride: 98 mEq/L (ref 96–112)
Creatinine, Ser: <0.47 mg/dL — ABNORMAL LOW (ref 0.50–1.10)
Glucose, Bld: 110 mg/dL — ABNORMAL HIGH (ref 70–99)

## 2010-11-20 LAB — CROSSMATCH: Unit division: 0

## 2010-11-20 LAB — BASIC METABOLIC PANEL
Calcium: 7.9 mg/dL — ABNORMAL LOW (ref 8.4–10.5)
Glucose, Bld: 91 mg/dL (ref 70–99)
Sodium: 127 mEq/L — ABNORMAL LOW (ref 135–145)

## 2010-11-20 LAB — CBC
Hemoglobin: 9.6 g/dL — ABNORMAL LOW (ref 12.0–15.0)
MCH: 31.1 pg (ref 26.0–34.0)
MCHC: 34.3 g/dL (ref 30.0–36.0)

## 2010-11-21 NOTE — Op Note (Signed)
[name], [name]          ACCOUNT NO.:  [id number]  MEDICAL RECORD NO.:  [id number]  LOCATION:  1620                         FACILITY:  [facility]  PHYSICIAN:  Vanita Panda. Magnus Ivan, M.D.DATE OF BIRTH:  03/03/1946  DATE OF PROCEDURE:  11/17/2010 DATE OF DISCHARGE:                              OPERATIVE REPORT   PREOPERATIVE DIAGNOSIS:  Moderate-to-severe osteoarthritis and pain, right hip.  POSTOPERATIVE DIAGNOSIS:  Moderate-to-severe osteoarthritis and pain, right hip.  PROCEDURE:  Right total hip arthroplasty through direct anterior approach.  IMPLANTS:  [employer] acetabular component with Gription size 50, neutral 32 +4 polyethylene liner, Corail HA-coated femoral component with collar size 12 with standard offset, and size 32 +1 ceramic hip ball.  SURGEON:  Vanita Panda. Magnus Ivan, M.D.  ANESTHESIA:  Spinal.  BLOOD LOSS:  400 cc.  ANTIBIOTICS:  Ancef 1 g IV.  COMPLICATIONS:  None.  INDICATIONS:  [name] is a 65 year old female with debilitating pain involving her right hip.  An MRI showed moderate-to-severe arthritis and a labral tear.  Injections were used to help, but this has gotten to where that was not helping and she was wishing for total hip arthroplasty due to the effects on her daily living and the pain she is having.  The risks and benefits of surgery explained to her in detail and she did wish to proceed with surgery.  PROCEDURE DESCRIPTION:  After informed consent was obtained, appropriate right hip was marked.  She was brought to the operating room and spinal anesthesia was then obtained.  She was then placed on the [facility] operating table with perineal post in place in both feet and in line skeletal traction with no traction applied and they were placed in boots. Fluoroscopic unit was brought in to the room to assess the hip.  I was able to examine the right hip under direct fluoroscopy and again the center of the pelvis to  view the lesser trochanter and the ischium, we could judge the leg length as well.  Her left hip was then prepped and draped with Hibiclens and sterile drapes were applied as well.  A time- out was called and she was identified as the correct patient and correct right hip.  I then made an incision 1 cm distal and 3 cm posterior to the anterior superior iliac spine and carried this obliquely down the leg.  I dissected down to the soft tissue to the tensor fascia lata. The tensor fascia lata was then divided obliquely and I proceeded with a direct anterior approach to the hip.  A Cobra retractor was placed on the lateral neck and medial neck and then I was able to divide the hip capsule and found significant effusion.  Under direct fluoroscopic, I assessed where my neck cut would be and this was also under direct visualization.  I then made my neck cut with an oscillating saw and completed this with an osteotome.  I placed a corkscrew in the femoral head and removed it in its entirety.  I found severe disease of the femoral head.  I then placed a Hohmann retractor anteriorly and one posterior.  I cleaned the acetabulum and began reaming from size 44 up to a 49  reamer. With the last few reamers were placed under direct fluoroscopy.  I then chose the [employer] acetabular component with 3 screw holes.  I placed this into the acetabulum and knocked this into place.  Under direct fluoroscopic guidance, again I was able to judge my inclination and version.  Once this was placed, it was secured and I did not need to fill any screw holes.  I placed a hole eliminator and then a real polyethylene liner 32 neutral +4.  Attention was then turned to the femur.  There was no traction applied to the leg. A temporary hook was placed underneath the greater trochanter and the proximal femur.  The femur was externally rotated to 90 degrees, extended, and adducted.  I then placed a Mueller retractor  medially and a Hohmann retractor under the tip of the greater trochanter.  I released the lateral capsule and like of the piriformis, although we gained a good exposure direction down to the femoral canal.  I then began broaching with a size 8 broach up to a size of 12.  The 12 was felt to be stable and tight.  I trialed a 32 +1 hip ball, I felt she was just slightly long, but only slight.  I then removed all trial instruments because she was stable with internal and external rotations and minimal shuck.  I placed a real HA-coated stem and a real 32 +1 hip ball.  We reduced this into acetabulum as well and again I recognized it was just slightly long, but stable.  I then removed all other instrumentation.  I copiously irrigated the hip socket and closed the joint capsule with interrupted #1 Ethibond suture followed by reapproximating the tensor fascia lata with a running #1 Vicryl, 2-0 Vicryl, and the subcutaneous tissue with 4-0 Monocryl and subcuticular stitch.  Steri-Strips were applied as well as well-padded sterile dressing.  The patient was taken to the recovery room in stable condition.  All final counts were correct and there were no complications noted.     Vanita Panda. Magnus Ivan, M.D.     CYB/MEDQ  D:  11/17/2010  T:  11/18/2010  Job:  [id number]  Electronically Signed by Doneen Poisson M.D. on 11/21/2010 12:27:10 PM

## 2010-11-26 NOTE — Discharge Summary (Signed)
  [name], [name]          ACCOUNT NO.:  [id number]  MEDICAL RECORD NO.:  [id number]  LOCATION:  1620                         FACILITY:  [facility]  PHYSICIAN:  Vanita Panda. Magnus Ivan, M.D.DATE OF BIRTH:  May 19, 1945  DATE OF ADMISSION:  11/17/2010 DATE OF DISCHARGE:  11/21/2010                              DISCHARGE SUMMARY   ADMITTING DIAGNOSIS:  Severe osteoarthritis and degenerative joint disease right hip.  DISCHARGE DIAGNOSIS:  Severe osteoarthritis and degenerative joint disease right hip.  PROCEDURE:  Right total hip arthroplasty through direct anterior approach on November 17, 2010.  HOSPITAL COURSE:  [name] is a 65 year old female with debilitating arthritis involving her right hip.  She was taken to the operating room on the day of admission for an elective total hip arthroplasty.  This was performed without complications.  She was admitted as an inpatient to the orthopedic floor, and she did have acute blood loss anemia during her hospital stay with low urine output and this did require transfusion.  Otherwise, she did well with an uneventful hospital stay.  It was felt she could be discharged safely home on the day of discharge.  DISPOSITION:  Discharged to home.  DISCHARGE INSTRUCTIONS:  While she is at home, she can get her incision wet in shower.  She will work on Investment banker, operational and balance training with physical therapy with no hip precautions.  A followup appointment will be established in the office in 2 weeks.  DISCHARGE MEDICATIONS: 1. Percocet. 2. Robaxin. 3. Xarelto. 4. Ambien. 5. Continue all same home medications as before on her discharge home     medication list.     Vanita Panda. Magnus Ivan, M.D.     CYB/MEDQ  D:  11/21/2010  T:  11/21/2010  Job:  [id number]  Electronically Signed by Doneen Poisson M.D. on 11/26/2010 08:27:58 PM

## 2011-04-06 ENCOUNTER — Encounter: Payer: Self-pay | Admitting: Internal Medicine

## 2011-04-11 ENCOUNTER — Encounter: Payer: Self-pay | Admitting: Internal Medicine

## 2011-04-11 ENCOUNTER — Ambulatory Visit (INDEPENDENT_AMBULATORY_CARE_PROVIDER_SITE_OTHER): Payer: Medicare Other | Admitting: Internal Medicine

## 2011-04-11 ENCOUNTER — Other Ambulatory Visit (INDEPENDENT_AMBULATORY_CARE_PROVIDER_SITE_OTHER): Payer: Medicare Other

## 2011-04-11 DIAGNOSIS — K5900 Constipation, unspecified: Secondary | ICD-10-CM

## 2011-04-11 DIAGNOSIS — K222 Esophageal obstruction: Secondary | ICD-10-CM

## 2011-04-11 DIAGNOSIS — K439 Ventral hernia without obstruction or gangrene: Secondary | ICD-10-CM

## 2011-04-11 LAB — CREATININE, SERUM: Creatinine, Ser: 0.8 mg/dL (ref 0.4–1.2)

## 2011-04-11 MED ORDER — ALIGN PO CAPS: 1.0000 | ORAL_CAPSULE | Freq: Every day | ORAL | Status: DC

## 2011-04-11 NOTE — Progress Notes (Signed)
[name] 03-May-1946 MRN [id number]        History of Present Illness:  This is a 65 year old white female with chronic constipation. Last appointment was in March 2009. She lost her sister to aggressive pancreatic cancer in the spring of 2012. She also had a very sick daughter with E.coli sepsis and most recently developed a right hip arthritis and underwent right hip arthroplasty in July 2012. She is  still in rehabilitation. She is complaining of difficulty to evacuate stool. She has interstitial cystitis treated by Dr. Patsi Reynolds. She has a personal history of breast cancer and family history of colon cancer in her mother. Last colonoscopy in October 2006 showed hemorrhoids and diverticulosis. There is also a history of esophageal stricture which was dilated in April 2009. She is having some recurrent symptoms of reflux and dysphagia. Her orthopedics specialist  Dr [name] suggests that she postponed her recall colonoscopy until January of 2013. She has noticed a bulge in the abdominal wall in the right middle quadrant of the abdomen. She has lost about 7 pounds since last year but she does not remember having the nodule and there before   Past Medical History  Diagnosis Date  . Diverticulosis   . GERD (gastroesophageal reflux disease)   . Breast cancer   . Esophageal stricture   . Hemorrhoids   . Mallory - Weiss tear   . Interstitial cystitis   . ADHD (attention deficit hyperactivity disorder)    Past Surgical History  Procedure Date  . Mastectomy modified radical     right; with immediate reconstruction  . Laparoscopy 1973  . Abdominal hysterectomy 1974  . Cystoscopy 1975, 2007  . Myringoplasty 1962  . Tympanoplasty 1973    right  . Oophorectomy 1982  . Bladder suspension   . Rectocele repair     with cystocele repair  . Breast biopsy 2002  . Total hip arthroplasty     reports that she has never smoked. She has never used smokeless tobacco. She reports that  she drinks alcohol. She reports that she does not use illicit drugs. family history includes Colon cancer in her mother; Depression in her mother; Diabetes in an unspecified family member; Heart disease in her father; Pancreatic cancer in her sister; and Uterine cancer in an unspecified family member. Allergies  Allergen Reactions  . Anesthetics, Amide     Patient unsure of names, however multiples cause swelling of airway  . Ciprofloxacin     REACTION: joint swelling  . Codeine   . Meperidine Hcl   . Morphine   . Nitrofurantoin     REACTION: neuropathy in legs  . Penicillins   . Sulfonamide Derivatives     REACTION: swelling, anaphylaxis        Review of Systems: Occasional dysphagia. Heartburn. Denies abdominal pain or rectal bleeding  The remainder of the 10 point ROS is negative except as outlined in H&P   Physical Exam: General appearance  Well developed, in no distress. Eyes- non icteric. HEENT nontraumatic, normocephalic. Mouth no lesions, tongue papillated, no cheilosis. Neck supple without adenopathy, thyroid not enlarged, no carotid bruits, no JVD. Lungs Clear to auscultation bilaterally. Cor normal S1, normal S2, regular rhythm, no murmur,  quiet precordium. Abdomen: In standing position is a palpable soft irregularity of the abdominal wall possibly related to hernia. It disappears when she lays down. Sitting up and laying down as well as straight leg raising is negative. The area is about 3 cm in diameter and  it's rather tender. There is no redness or fluctuation Rectal: Soft Hemoccult negative stool Extremities no pedal edema. Skin no lesions. Neurological alert and oriented x 3. Psychological normal mood and affect.  Assessment and Plan:  Functional constipation. Relieved with MiraLax. Maybe related to recent hip surgery and stress. She is on the probiotic which seemed to help.  Family history of colon cancer. She is due for recall colonoscopy. We will  schedule for January 2013  Nodule in the abdominal wall possibly related to ventral hernia. Schedule CT scan of the abdomen and pelvis to assess for abdominal wall Gastroesophageal reflux disease duefor repeat upper endoscopy and possible dilatation, this will be scheduled at the time of colonoscopy    04/11/2011 [name]

## 2011-04-11 NOTE — Patient Instructions (Addendum)
You have been scheduled for a CT scan of the abdomen and pelvis at [facility] ([address]---this is in the same building as Architectural technologist).   You are scheduled on 04/12/11 at 9:30 am. You should arrive 15 minutes prior to your appointment time for registration. Please follow the written instructions below on the day of your exam:  WARNING: IF YOU ARE ALLERGIC TO IODINE/X-RAY DYE, PLEASE NOTIFY RADIOLOGY IMMEDIATELY AT [phone]! YOU WILL BE GIVEN A 13 HOUR PREMEDICATION PREP.  1) Do not eat or drink anything after 5:30 am (4 hours prior to your test) 2) You have been given 2 bottles of oral contrast to drink. The solution may taste better if refrigerated, but do NOT add ice or any other liquid to this solution. Shake  well before drinking.    Drink 1 bottle of contrast @ 7:30 am (2 hours prior to your exam)  Drink 1 bottle of contrast @ 6:30 am (1 hour prior to your exam)  You may take any medications as prescribed with a small amount of water except for the following: Metformin, Glucophage, Glucovance, Avandamet, Riomet, Fortamet, Actoplus Met, Janumet, Glumetza or Metaglip. The above medications must be held the day of the exam AND 48 hours after the exam.  The purpose of you drinking the oral contrast is to aid in the visualization of your intestinal tract. The contrast solution may cause some diarrhea. Before your exam is started, you will be given a small amount of fluid to drink. Depending on your individual set of symptoms, you may also receive an intravenous injection of x-ray contrast/dye. Plan on being at [facility] for 30 minutes or long, depending on the type of exam you are having performed.  If you have any questions regarding your exam or if you need to reschedule, you may call the CT department at [phone] between the hours of 8:00 am and 5:00 pm, Monday-Friday.  ________________________________________________________________________  We  have given you samples of Align. This puts good bacteria back into your intestines. You should take 1 capsule by mouth once daily. If this works well for you, it can be purchased over the counter. Please call back to schedule your endoscopy/colonoscopy with dilation at a date that is convenient for you in January 2013. CC: Dr [name], Dr Patsi Sears

## 2011-04-12 ENCOUNTER — Ambulatory Visit (INDEPENDENT_AMBULATORY_CARE_PROVIDER_SITE_OTHER)
Admission: RE | Admit: 2011-04-12 | Discharge: 2011-04-12 | Disposition: A | Discharge status: Home / Self Care | Payer: Medicare Other | Source: Ambulatory Visit | Attending: Internal Medicine | Admitting: Internal Medicine

## 2011-04-12 DIAGNOSIS — K5900 Constipation, unspecified: Secondary | ICD-10-CM

## 2011-04-12 MED ORDER — IOHEXOL 300 MG/ML  SOLN
100.0000 mL | Freq: Once | INTRAMUSCULAR | Status: AC | PRN
  Administered 2011-04-12: 100 mL via INTRAVENOUS

## 2011-04-19 ENCOUNTER — Telehealth (INDEPENDENT_AMBULATORY_CARE_PROVIDER_SITE_OTHER): Payer: Self-pay | Admitting: General Surgery

## 2011-04-19 NOTE — Telephone Encounter (Signed)
Called patient, scheduled for new patient appt on 05/04/11. This time okay for patient.

## 2011-04-19 NOTE — Telephone Encounter (Signed)
Message copied by Liliana Cline on Thu Apr 19, 2011  9:13 AM ------      Message from: Currie Paris      Created: Wed Apr 18, 2011  6:28 PM       Please call her and set up an appointment, She has a symptomatic hernia

## 2011-04-20 DIAGNOSIS — K439 Ventral hernia without obstruction or gangrene: Secondary | ICD-10-CM | POA: Insufficient documentation

## 2011-05-04 ENCOUNTER — Encounter (INDEPENDENT_AMBULATORY_CARE_PROVIDER_SITE_OTHER): Payer: Self-pay | Admitting: Surgery

## 2011-05-04 ENCOUNTER — Ambulatory Visit (INDEPENDENT_AMBULATORY_CARE_PROVIDER_SITE_OTHER): Payer: Medicare Other | Admitting: Surgery

## 2011-05-04 VITALS — BP 116/70 | HR 72 | Temp 98.6°F | Resp 12 | Ht 67.0 in | Wt 139.0 lb

## 2011-05-04 DIAGNOSIS — K439 Ventral hernia without obstruction or gangrene: Secondary | ICD-10-CM

## 2011-05-04 NOTE — Progress Notes (Signed)
Patient ID: [name], female   DOB: 1945/08/01, 65 y.o.   MRN: [id number]  Chief Complaint  Patient presents with  . Other    new pt- eval ventral hernia    HPI [name] is a 65 y.o. female.  She has recently noticed a bulge that is in the right lower quadrant of her abdomen. She has been doing a lot of exercises as part of her rehabilitation both for her recent hip replacement as well as her back tissues. The area has become more tender. A CAT scan was done showing what appears to be a small hernia in the area. She comes in to discuss possible repair.  She's not really have any GI symptoms from this. It is painful she stands up or strains. It's been tender. It does seem to go away and become a flat when she lies down.  She is scheduled for a colonoscopy in about 10 days. HPI  Past Medical History  Diagnosis Date  . Diverticulosis   . GERD (gastroesophageal reflux disease)   . Esophageal stricture   . Hemorrhoids   . Mallory - Weiss tear   . Interstitial cystitis   . ADHD (attention deficit hyperactivity disorder)   . Breast cancer     right side  . Asthma   . Arthritis   . Blood transfusion   . Thyroid disease     Past Surgical History  Procedure Date  . Mastectomy modified radical     right; with immediate reconstruction  . Laparoscopy 1973  . Abdominal hysterectomy 1974  . Cystoscopy 1975, 2007  . Myringoplasty 1962  . Tympanoplasty 1973    right  . Oophorectomy 1982  . Bladder suspension   . Rectocele repair     with cystocele repair  . Breast biopsy 2002  . Total hip arthroplasty     Family History  Problem Relation Age of Onset  . Heart disease Father   . Diabetes      aunt  . Uterine cancer      grandmother  . Colon cancer Mother   . Depression Mother   . Cancer Mother     colon  . Pancreatic cancer Sister   . Cancer Sister     pancreatic  . Cancer Maternal Grandmother     ovarian    Social History History  Substance  Use Topics  . Smoking status: Never Smoker   . Smokeless tobacco: Never Used  . Alcohol Use: Yes     nightly    Allergies  Allergen Reactions  . Anesthetics, Amide     Patient unsure of names, however multiples cause swelling of airway  . Ciprofloxacin     REACTION: joint swelling  . Codeine   . Meperidine Hcl   . Morphine   . Nitrofurantoin     REACTION: neuropathy in legs  . Penicillins   . Sulfonamide Derivatives     REACTION: swelling, anaphylaxis  . Latex Rash    Current Outpatient Prescriptions  Medication Sig Dispense Refill  . amphetamine-dextroamphetamine (ADDERALL) 10 MG tablet Take 10 mg by mouth daily.        . bifidobacterium infantis (ALIGN) capsule Take 1 capsule by mouth daily.  1 capsule  0  . buPROPion (WELLBUTRIN SR) 150 MG 12 hr tablet Take 150 mg by mouth 3 (three) times daily.        . citalopram (CELEXA) 40 MG tablet Take 40 mg by mouth daily.        Marland Kitchen  clonazePAM (KLONOPIN) 1 MG tablet Take 1 mg by mouth 3 (three) times daily as needed.        Marland Kitchen esomeprazole (NEXIUM) 40 MG capsule Take 40 mg by mouth every other day.        . estradiol (ESTRING) 2 MG vaginal ring Place 2 mg vaginally every 3 (three) months. follow package directions       . estradiol (VIVELLE-DOT) 0.025 MG/24HR Place 1 patch onto the skin 2 (two) times a week.        . fexofenadine (ALLEGRA) 180 MG tablet Take 180 mg by mouth daily.        Marland Kitchen levothyroxine (SYNTHROID, LEVOTHROID) 112 MCG tablet Take 112 mcg by mouth daily.        . pirbuterol (MAXAIR) 200 MCG/INH inhaler Inhale 2 puffs into the lungs 4 (four) times daily as needed.        . triamterene-hydrochlorothiazide (MAXZIDE) 75-50 MG per tablet Take 1 tablet by mouth daily.          Review of Systems Review of Systems  Constitutional: Negative for fever, chills and unexpected weight change.  HENT: Positive for hearing loss and congestion. Negative for sore throat, trouble swallowing and voice change.   Eyes: Negative for visual  disturbance.  Respiratory: Negative for cough and wheezing.   Cardiovascular: Negative for chest pain, palpitations and leg swelling.  Gastrointestinal: Positive for abdominal pain and constipation. Negative for nausea, vomiting, diarrhea, blood in stool, abdominal distention and anal bleeding.  Genitourinary: Negative for hematuria, vaginal bleeding and difficulty urinating.  Musculoskeletal: Positive for arthralgias.  Skin: Negative for rash and wound.  Neurological: Negative for seizures, syncope and headaches.  Hematological: Negative for adenopathy. Bruises/bleeds easily.  Psychiatric/Behavioral: Negative for confusion.    Blood pressure 116/70, pulse 72, temperature 98.6 F (37 C), temperature source Temporal, resp. rate 12, height 5\' 7"  (1.702 m), weight 139 lb (63.05 kg).  Physical Exam Physical Exam  Vitals reviewed. Constitutional: She is oriented to person, place, and time. She appears well-developed and well-nourished. No distress.  HENT:  Head: Normocephalic and atraumatic.  Mouth/Throat: Oropharynx is clear and moist.  Eyes: Conjunctivae and EOM are normal. Pupils are equal, round, and reactive to light. No scleral icterus.  Neck: Normal range of motion. Neck supple. No tracheal deviation present. No thyromegaly present.  Cardiovascular: Normal rate, regular rhythm, normal heart sounds and intact distal pulses.  Exam reveals no gallop and no friction rub.   No murmur heard. Pulmonary/Chest: Effort normal and breath sounds normal. No respiratory distress. She has no wheezes. She has no rales.  Abdominal: Soft. Bowel sounds are normal. She exhibits mass. She exhibits no distension. There is no tenderness. There is no rebound and no guarding.       The is a bulge in the RLQ consistent with a Spigelian hernia. It is tender when protruding, but reduces as soon as she is supine  Musculoskeletal: Normal range of motion. She exhibits no edema and no tenderness.  Neurological: She  is alert and oriented to person, place, and time.  Skin: Skin is warm and dry. No rash noted. She is not diaphoretic. No erythema.  Psychiatric: She has a normal mood and affect. Her behavior is normal. Judgment and thought content normal.    Data Reviewed Reviewed her Ct and Epic chart  Assessment    Spigelian hernia RLQ    Plan    Repair as OP with mesh. Discussed and explained surgery risks etc with patient and  her husband and all questions answered.Will do sometime after she has her colonoscopy.        Jaimey Franchini J 05/04/2011, 10:06 AM

## 2011-05-04 NOTE — Patient Instructions (Signed)
We will schedule outpatient surgery to repair your hernia

## 2011-05-10 ENCOUNTER — Telehealth: Payer: Self-pay | Admitting: *Deleted

## 2011-05-10 NOTE — Telephone Encounter (Signed)
Spoke with husband and they had forgotten appt.  [facility] previsit for 05/11/11 at 10:30am.  [name]

## 2011-05-11 ENCOUNTER — Ambulatory Visit (AMBULATORY_SURGERY_CENTER): Payer: Medicare Other | Admitting: *Deleted

## 2011-05-11 VITALS — Ht 67.0 in | Wt 136.0 lb

## 2011-05-11 DIAGNOSIS — Z1211 Encounter for screening for malignant neoplasm of colon: Secondary | ICD-10-CM

## 2011-05-11 DIAGNOSIS — R1310 Dysphagia, unspecified: Secondary | ICD-10-CM

## 2011-05-11 DIAGNOSIS — K5900 Constipation, unspecified: Secondary | ICD-10-CM

## 2011-05-11 MED ORDER — PEG-KCL-NACL-NASULF-NA ASC-C 100 G PO SOLR: ORAL | Status: DC

## 2011-05-17 ENCOUNTER — Ambulatory Visit (AMBULATORY_SURGERY_CENTER): Payer: Medicare Other | Admitting: Internal Medicine

## 2011-05-17 ENCOUNTER — Encounter: Payer: Self-pay | Admitting: Internal Medicine

## 2011-05-17 VITALS — BP 131/66 | HR 66 | Temp 96.9°F | Resp 17 | Ht 67.0 in | Wt 136.0 lb

## 2011-05-17 DIAGNOSIS — K5900 Constipation, unspecified: Secondary | ICD-10-CM

## 2011-05-17 DIAGNOSIS — R1310 Dysphagia, unspecified: Secondary | ICD-10-CM | POA: Diagnosis not present

## 2011-05-17 DIAGNOSIS — R1319 Other dysphagia: Secondary | ICD-10-CM

## 2011-05-17 DIAGNOSIS — K219 Gastro-esophageal reflux disease without esophagitis: Secondary | ICD-10-CM | POA: Diagnosis not present

## 2011-05-17 DIAGNOSIS — Z800 Family history of malignant neoplasm of digestive organs: Secondary | ICD-10-CM | POA: Diagnosis not present

## 2011-05-17 DIAGNOSIS — Z1211 Encounter for screening for malignant neoplasm of colon: Secondary | ICD-10-CM | POA: Diagnosis not present

## 2011-05-17 MED ORDER — SODIUM CHLORIDE 0.9 % IV SOLN: 500.0000 mL | INTRAVENOUS | Status: DC

## 2011-05-17 NOTE — Op Note (Signed)
[facility] [address] [place], [place]  [address]  ENDOSCOPY PROCEDURE REPORT  PATIENT:  Courtney, Reynolds  MR#:  [id number] BIRTHDATE:  14-Aug-1945, 65 yrs. old  GENDER:  female  ENDOSCOPIST:  Hedwig Morton. Juanda Chance, MD Referred by:  Geoffry Paradise, M.D.  PROCEDURE DATE:  05/17/2011 PROCEDURE:  EGD, diagnostic 43235, Maloney Dilation of Esophagus ASA CLASS:  Class II INDICATIONS:  dysphagia, GERD hx of GERD,/stricture dilated in 2003 and 2006,  cervical laminectomy several years ago  MEDICATIONS:   These medications were titrated to patient response per physician's verbal order, Versed 10 mg, Fentanyl 100 mcg TOPICAL ANESTHETIC:  Cetacaine Spray  DESCRIPTION OF PROCEDURE:   After the risks benefits and alternatives of the procedure were thoroughly explained, informed consent was obtained.  The LB GIF-H180 [id number] endoscope was introduced through the mouth and advanced to the second portion of the duodenum, without limitations.  The instrument was slowly withdrawn as the mucosa was fully examined. <<PROCEDUREIMAGES>>  Esophageal spasm (see image1, image6, image5, and image7). spasm UES, no stricture, maloney dilator 27F dilator passed without difficulty  Otherwise the examination was normal (see image4, image3, and image2).    Retroflexed views revealed no abnormalities.    The scope was then withdrawn from the patient and the procedure completed.  COMPLICATIONS:  None  ENDOSCOPIC IMPRESSION: 1) Esophageal spasm 2) Otherwise normal examination s/p passage of Maloney dilator 50 F, no evidence of a stricture, suspect esophageal dismotility RECOMMENDATIONS: wait and see if the dilation helped like it did last time, if not, obtain Barium esophagram to assess the motility  REPEAT EXAM:  In 0 year(s) for.  ______________________________ Hedwig Morton. Juanda Chance, MD  CC:  n. eSIGNED:   Hedwig Morton. Lilian Fuhs at 05/17/2011 08:56 AM  [name], [phone]

## 2011-05-17 NOTE — Progress Notes (Signed)
Patient did not experience any of the following events: a burn prior to discharge; a fall within the facility; wrong site/side/patient/procedure/implant event; or a hospital transfer or hospital admission upon discharge from the facility. (G8907) Patient did not have preoperative order for IV antibiotic SSI prophylaxis. (G8918)  

## 2011-05-17 NOTE — Op Note (Signed)
[facility] [address] [place], [place]  [address]  COLONOSCOPY PROCEDURE REPORT  PATIENT:  Courtney, Reynolds  MR#:  [id number] BIRTHDATE:  07/31/1945, 65 yrs. old  GENDER:  female ENDOSCOPIST:  Hedwig Morton. Juanda Chance, MD REF. BY:  Geoffry Paradise, M.D. PROCEDURE DATE:  05/17/2011 PROCEDURE:  Colonoscopy 19147 ASA CLASS:  Class II INDICATIONS:  family history of colon cancer mother with colon cancer, prior colon 1987, 2001,2006, no polyps RLQ and LLQ abd. pain MEDICATIONS:   These medications were titrated to patient response per physician's verbal order, Versed 3 mg, Fentanyl 25 mcg  DESCRIPTION OF PROCEDURE:   After the risks and benefits and of the procedure were explained, informed consent was obtained. Digital rectal exam was performed and revealed no rectal masses. The LB [id number]-H180AL [id number] endoscope was introduced through the anus and advanced to the cecum, which was identified by both the appendix and ileocecal valve.  The quality of the prep was good, using MoviPrep.  The instrument was then slowly withdrawn as the colon was fully examined. <<PROCEDUREIMAGES>>  FINDINGS:  Mild diverticulosis was found in the sigmoid colon (see image4).  This was otherwise a normal examination of the colon (see image5, image3, image2, and image1).   Retroflexed views in the rectum revealed no abnormalities.    The scope was then withdrawn from the patient and the procedure completed.  COMPLICATIONS:  None ENDOSCOPIC IMPRESSION: 1) Mild diverticulosis in the sigmoid colon 2) Otherwise normal examination RECOMMENDATIONS: 1) High fiber diet. start stool softener or a mild laxative OTC prn  REPEAT EXAM:  In 5 year(s) for.  ______________________________ Hedwig Morton. Juanda Chance, MD  CC:  n. eSIGNED:   Hedwig Morton. Yanissa Michalsky at 05/17/2011 09:01 AM  [name], [phone]

## 2011-05-17 NOTE — Progress Notes (Signed)
PT STATES SHE HAS BEEN DX'D WITH SPAGALION HERNIA ON THE RT SIDE AND HAS SURGERY SCHEDULED FOR 05-30-11 BUT PT STATES SHE IS HAVING A BURNING/STINGING ON THE LEFT SIDE AND ? IF HAS SAME ON THE LEFT. INSTRUCTED TO DISCUSS WITH HER SURGEON AS WELL BY A WILLIS RN. PT STATES SHE WILL MENTION TO DR BRODIE AS WELL. EWM

## 2011-05-17 NOTE — Patient Instructions (Signed)
Discharge instructions given with verbal understanding. Handouts on diverticulosis, high fiber diet, and a dilatation diet given. Resume previous medications.

## 2011-05-18 ENCOUNTER — Telehealth: Payer: Self-pay | Admitting: *Deleted

## 2011-05-18 NOTE — Telephone Encounter (Signed)
No answer, identifier on machine, message left to call if concerns or questions [phone].

## 2011-05-29 ENCOUNTER — Encounter (HOSPITAL_BASED_OUTPATIENT_CLINIC_OR_DEPARTMENT_OTHER): Payer: Self-pay | Admitting: *Deleted

## 2011-05-29 ENCOUNTER — Other Ambulatory Visit (INDEPENDENT_AMBULATORY_CARE_PROVIDER_SITE_OTHER): Payer: Self-pay | Admitting: Surgery

## 2011-05-29 NOTE — Progress Notes (Signed)
Pt has had bp drop post op Has severe n/v post op-wants scop patch and iv meds Has had reactions to some anesthetic,will get records. Had recent colonoscopy-did well. Asthma controlled-will bring inhaler. Needs istat

## 2011-05-30 ENCOUNTER — Ambulatory Visit (HOSPITAL_BASED_OUTPATIENT_CLINIC_OR_DEPARTMENT_OTHER): Payer: Medicare Other | Admitting: *Deleted

## 2011-05-30 ENCOUNTER — Ambulatory Visit (HOSPITAL_BASED_OUTPATIENT_CLINIC_OR_DEPARTMENT_OTHER)
Admission: RE | Admit: 2011-05-30 | Discharge: 2011-05-30 | Disposition: A | Discharge status: Home / Self Care | Payer: Medicare Other | Source: Ambulatory Visit | Attending: Surgery | Admitting: Surgery

## 2011-05-30 ENCOUNTER — Encounter (HOSPITAL_BASED_OUTPATIENT_CLINIC_OR_DEPARTMENT_OTHER): Payer: Self-pay | Admitting: *Deleted

## 2011-05-30 ENCOUNTER — Encounter (HOSPITAL_BASED_OUTPATIENT_CLINIC_OR_DEPARTMENT_OTHER)
Admission: RE | Disposition: A | Discharge status: Home / Self Care | Payer: Self-pay | Source: Ambulatory Visit | Attending: Surgery

## 2011-05-30 DIAGNOSIS — K219 Gastro-esophageal reflux disease without esophagitis: Secondary | ICD-10-CM | POA: Insufficient documentation

## 2011-05-30 DIAGNOSIS — J45909 Unspecified asthma, uncomplicated: Secondary | ICD-10-CM | POA: Insufficient documentation

## 2011-05-30 DIAGNOSIS — K439 Ventral hernia without obstruction or gangrene: Secondary | ICD-10-CM

## 2011-05-30 HISTORY — DX: Nausea with vomiting, unspecified: Z98.890

## 2011-05-30 HISTORY — DX: Nausea with vomiting, unspecified: R11.2

## 2011-05-30 HISTORY — DX: Adverse effect of unspecified anesthetic, initial encounter: T41.45XA

## 2011-05-30 HISTORY — PX: VENTRAL HERNIA REPAIR: SHX424

## 2011-05-30 HISTORY — DX: Other complications of anesthesia, initial encounter: T88.59XA

## 2011-05-30 LAB — POCT I-STAT, CHEM 8
Chloride: 102 mEq/L (ref 96–112)
Glucose, Bld: 74 mg/dL (ref 70–99)
HCT: 43 % (ref 36.0–46.0)
Hemoglobin: 14.6 g/dL (ref 12.0–15.0)
Potassium: 3.7 mEq/L (ref 3.5–5.1)
Sodium: 138 mEq/L (ref 135–145)

## 2011-05-30 SURGERY — REPAIR, HERNIA, VENTRAL
Anesthesia: General | Site: Abdomen | Laterality: Right | Wound class: Clean

## 2011-05-30 MED ORDER — ACETAMINOPHEN 10 MG/ML IV SOLN
1000.0000 mg | Freq: Once | INTRAVENOUS | Status: AC
  Administered 2011-05-30: 1000 mg via INTRAVENOUS

## 2011-05-30 MED ORDER — CHLORHEXIDINE GLUCONATE 4 % EX LIQD: 1.0000 "application " | Freq: Once | CUTANEOUS | Status: DC

## 2011-05-30 MED ORDER — EPHEDRINE SULFATE 50 MG/ML IJ SOLN
INTRAMUSCULAR | Status: DC | PRN
  Administered 2011-05-30 (×2): 5 mg via INTRAVENOUS

## 2011-05-30 MED ORDER — FENTANYL CITRATE 0.05 MG/ML IJ SOLN
INTRAMUSCULAR | Status: DC | PRN
  Administered 2011-05-30: 100 ug via INTRAVENOUS

## 2011-05-30 MED ORDER — ONDANSETRON HCL 4 MG/2ML IJ SOLN
INTRAMUSCULAR | Status: DC | PRN
  Administered 2011-05-30: 4 mg via INTRAVENOUS

## 2011-05-30 MED ORDER — PROPOFOL 10 MG/ML IV EMUL
INTRAVENOUS | Status: DC | PRN
  Administered 2011-05-30: 150 mg via INTRAVENOUS

## 2011-05-30 MED ORDER — OXYCODONE HCL 5 MG PO TABS
5.0000 mg | ORAL_TABLET | ORAL | Status: AC | PRN
  Administered 2011-05-30: 5 mg via ORAL

## 2011-05-30 MED ORDER — VANCOMYCIN HCL IN DEXTROSE 1-5 GM/200ML-% IV SOLN
1000.0000 mg | INTRAVENOUS | Status: AC
  Administered 2011-05-30: 1000 mg via INTRAVENOUS

## 2011-05-30 MED ORDER — PROMETHAZINE HCL 25 MG/ML IJ SOLN: 6.2500 mg | INTRAMUSCULAR | Status: DC | PRN

## 2011-05-30 MED ORDER — BUPIVACAINE HCL (PF) 0.25 % IJ SOLN
INTRAMUSCULAR | Status: DC | PRN
  Administered 2011-05-30: 30 mL

## 2011-05-30 MED ORDER — MIDAZOLAM HCL 5 MG/5ML IJ SOLN
INTRAMUSCULAR | Status: DC | PRN
  Administered 2011-05-30: 0.5 mg via INTRAVENOUS
  Administered 2011-05-30: 1 mg via INTRAVENOUS

## 2011-05-30 MED ORDER — OXYCODONE HCL 5 MG PO TABS: 5.0000 mg | ORAL_TABLET | ORAL | Status: AC | PRN

## 2011-05-30 MED ORDER — FENTANYL CITRATE 0.05 MG/ML IJ SOLN
25.0000 ug | INTRAMUSCULAR | Status: DC | PRN
  Administered 2011-05-30: 50 ug via INTRAVENOUS

## 2011-05-30 MED ORDER — LACTATED RINGERS IV SOLN
INTRAVENOUS | Status: DC
  Administered 2011-05-30 (×2): via INTRAVENOUS

## 2011-05-30 SURGICAL SUPPLY — 59 items
BLADE HEX COATED 2.75 (ELECTRODE) ×2 IMPLANT
BLADE SURG 15 STRL LF DISP TIS (BLADE) ×1 IMPLANT
BLADE SURG 15 STRL SS (BLADE) ×1
BLADE SURG ROTATE 9660 (MISCELLANEOUS) IMPLANT
CANISTER SUCTION 1200CC (MISCELLANEOUS) IMPLANT
CHLORAPREP W/TINT 26ML (MISCELLANEOUS) ×2 IMPLANT
CLOTH BEACON ORANGE TIMEOUT ST (SAFETY) ×2 IMPLANT
COTTONBALL LRG STERILE PKG (GAUZE/BANDAGES/DRESSINGS) IMPLANT
COVER MAYO STAND STRL (DRAPES) ×2 IMPLANT
COVER TABLE BACK 60X90 (DRAPES) ×2 IMPLANT
DECANTER SPIKE VIAL GLASS SM (MISCELLANEOUS) ×2 IMPLANT
DERMABOND ADVANCED (GAUZE/BANDAGES/DRESSINGS) ×1
DERMABOND ADVANCED .7 DNX12 (GAUZE/BANDAGES/DRESSINGS) ×1 IMPLANT
DRAPE LAPAROTOMY TRNSV 102X78 (DRAPE) ×2 IMPLANT
DRAPE PED LAPAROTOMY (DRAPES) IMPLANT
DRAPE UTILITY XL STRL (DRAPES) ×2 IMPLANT
ELECT REM PT RETURN 9FT ADLT (ELECTROSURGICAL) ×2
ELECTRODE REM PT RTRN 9FT ADLT (ELECTROSURGICAL) ×1 IMPLANT
GAUZE SPONGE 4X4 12PLY STRL LF (GAUZE/BANDAGES/DRESSINGS) ×4 IMPLANT
GLOVE BIO SURGEON STRL SZ 6.5 (GLOVE) IMPLANT
GLOVE BIOGEL PI IND STRL 6.5 (GLOVE) IMPLANT
GLOVE BIOGEL PI INDICATOR 6.5 (GLOVE)
GLOVE EUDERMIC 7 POWDERFREE (GLOVE) ×2 IMPLANT
GLOVE SKINSENSE NS SZ6.5 (GLOVE) ×1
GLOVE SKINSENSE NS SZ7.0 (GLOVE) ×3
GLOVE SKINSENSE STRL SZ6.5 (GLOVE) ×1 IMPLANT
GLOVE SKINSENSE STRL SZ7.0 (GLOVE) ×3 IMPLANT
GLOVE SURG SS PI 6.5 STRL IVOR (GLOVE) ×2 IMPLANT
GOWN PREVENTION PLUS XLARGE (GOWN DISPOSABLE) ×2 IMPLANT
GOWN PREVENTION PLUS XXLARGE (GOWN DISPOSABLE) ×2 IMPLANT
MESH HERNIA 3X6 (Mesh General) ×2 IMPLANT
NEEDLE HYPO 25X1 1.5 SAFETY (NEEDLE) ×2 IMPLANT
NS IRRIG 1000ML POUR BTL (IV SOLUTION) ×2 IMPLANT
PACK BASIN DAY SURGERY FS (CUSTOM PROCEDURE TRAY) ×2 IMPLANT
PENCIL BUTTON HOLSTER BLD 10FT (ELECTRODE) ×2 IMPLANT
SLEEVE SCD COMPRESS KNEE MED (MISCELLANEOUS) ×2 IMPLANT
SPONGE INTESTINAL PEANUT (DISPOSABLE) ×2 IMPLANT
SPONGE LAP 4X18 X RAY DECT (DISPOSABLE) ×4 IMPLANT
SUT CHROMIC 2 0 SH (SUTURE) IMPLANT
SUT MNCRL AB 4-0 PS2 18 (SUTURE) ×2 IMPLANT
SUT PROLENE 0 CT 1 CR/8 (SUTURE) ×4 IMPLANT
SUT PROLENE 0 CT 2 (SUTURE) IMPLANT
SUT PROLENE 2 0 CT2 30 (SUTURE) IMPLANT
SUT SILK 2 0 TIES 17X18 (SUTURE)
SUT SILK 2-0 18XBRD TIE BLK (SUTURE) IMPLANT
SUT SILK 4 0 TIES 17X18 (SUTURE) IMPLANT
SUT VIC AB 2-0 CT1 27 (SUTURE)
SUT VIC AB 2-0 CT1 TAPERPNT 27 (SUTURE) IMPLANT
SUT VIC AB 3-0 54X BRD REEL (SUTURE) IMPLANT
SUT VIC AB 3-0 BRD 54 (SUTURE)
SUT VIC AB 3-0 CT1 27 (SUTURE) ×1
SUT VIC AB 3-0 CT1 27XBRD (SUTURE) ×1 IMPLANT
SUT VICRYL 3-0 CR8 SH (SUTURE) ×2 IMPLANT
SYR CONTROL 10ML LL (SYRINGE) ×2 IMPLANT
TOWEL OR 17X24 6PK STRL BLUE (TOWEL DISPOSABLE) ×2 IMPLANT
TOWEL OR NON WOVEN STRL DISP B (DISPOSABLE) ×2 IMPLANT
TUBE CONNECTING 20X1/4 (TUBING) IMPLANT
WATER STERILE IRR 1000ML POUR (IV SOLUTION) IMPLANT
YANKAUER SUCT BULB TIP NO VENT (SUCTIONS) IMPLANT

## 2011-05-30 NOTE — Interval H&P Note (Signed)
History and Physical Interval Note:  05/30/2011 9:28 AM  [name]  has presented today for surgery, with the diagnosis of spigelian hernia -right  The various methods of treatment have been discussed with the patient and family. After consideration of risks, benefits and other options for treatment, the patient has consented to  Procedure(s): HERNIA REPAIR VENTRAL ADULT as a surgical intervention .  The patients' history has been reviewed, patient examined, no change in status, stable for surgery.  I have reviewed the patients' chart and labs.  Questions were answered to the patient's satisfaction.  I examined the abdomen and marked the exact site of the bulge, which is just superior to a long transverse abdominal incision.   [name] J

## 2011-05-30 NOTE — Anesthesia Procedure Notes (Signed)
Procedure Name: LMA Insertion Date/Time: 05/30/2011 9:57 AM Performed by: Meyer Russel Pre-anesthesia Checklist: Patient identified, Emergency Drugs available, Suction available, Patient being monitored and Timeout performed Patient Re-evaluated:Patient Re-evaluated prior to inductionOxygen Delivery Method: Circle System Utilized Preoxygenation: Pre-oxygenation with 100% oxygen Intubation Type: IV induction Ventilation: Mask ventilation without difficulty LMA: LMA inserted LMA Size: 4.0 Number of attempts: 1 Airway Equipment and Method: bite block (gauze bite block placed, teeth unchanged) Placement Confirmation: positive ETCO2 and breath sounds checked- equal and bilateral Tube secured with: Tape Dental Injury: Teeth and Oropharynx as per pre-operative assessment

## 2011-05-30 NOTE — Op Note (Signed)
[name] 03/10/1946 [id number] 05/04/2011  Preoperative diagnosis: Right spigelian hernia  Postoperative diagnosis: Same  Procedure: Repair of spigelian hernia with mesh  Surgeon: Currie Paris, MD, FACS  Assistant: None  Anesthesia: General   Clinical History and Indications: This patient was evaluated for some right lower quadrant pain and was found on a CT scan to have a spigelian hernia with a small knuckle of colon that was protruding into it consistent with a Richter's type hernia. After discussion with the patient she elected to proceed to surgical repair.    Description of Procedure: The patient was seen in the preoperative area. She was reexamined and the exact spot of the hernia marked on the skin. The patient was then taken to the operating room. After satisfactory general anesthesia had been obtained the abdomen was prepped and draped. A timeout was done.  I made a transverse incision directly over the hernia site. This was a few centimeters above a long transverse incision from prior abdominal surgery.I divided the subcutaneous tissue with cautery as I was able to see the external oblique aponeurosis I could see in that area consistent with the hernia.The external oblique was scarred inferiorly from the prior abdominal incision. However I was able to free it up from subcutaneous tissue lateral and superiorly selected clearly see the external oblique. I then opened the external oblique here in line with its fibers. I was able to then open it over the hernia and extend that inferior and medial.There was fat protruding through the hernia and into the little pocket under the external oblique extending laterally. This was freed up from the internal oblique. Again there was scarring medially and I had to then free up the external oblique from the internal obliques Begin a clear definition of the medial end of the hernia defect. This was all done sharply.  Once I had the  pre-peritoneal fat freed up from the edges of the internal oblique and the edges all well identified and reduced I was able to repair the hernia. I closed the defect with interrupted 0 Prolene sutures. The defect measured about 3 cm long. The sutures closed easily and with no tension.  As part of my initial dissection I had elevated the external oblique off of the internal oblique and least 2-3 cm in all directions around the defect. At this point I injected 0.25% plain Marcaine into all of the internal oblique and surrounding tissues. I used 20 cc. I then put a piece of Marlex mesh cut 8 cm long 5 cm wide and sutured this in to the internal oblique covering the entire repair widely. This was all done with 0 Prolene. The Prolene sutures used for the primary repair were threaded through the mesh and tied down to anchor the mesh to the repair. I thought we had a good repair that had no tension and was well covered with mesh.  We irrigated made sure everything was dry. I put some more Marcaine in the superficial tissues. I closed the external oblique aponeurosis with 3-0 running Vicryl and Scarpa's and subcutaneous with 3-0 Vicryl. Skin was closed with 4-0 Monocryl subcuticular plus Dermabond.  The patient tolerated the procedure well. There were no complications and all counts were correct.  Currie Paris, MD, FACS 05/30/2011 11:02 AM

## 2011-05-30 NOTE — Anesthesia Postprocedure Evaluation (Signed)
  Anesthesia Post-op Note  Patient: [name]  Procedure(s) Performed:  HERNIA REPAIR VENTRAL ADULT - repair right spigelian hernia  Patient Location: PACU  Anesthesia Type: General  Level of Consciousness: awake, alert  and oriented  Airway and Oxygen Therapy: Patient Spontanous Breathing  Post-op Pain: none  Post-op Assessment: Post-op Vital signs reviewed, Patient's Cardiovascular Status Stable, Respiratory Function Stable, Patent Airway, No signs of Nausea or vomiting, Adequate PO intake and Pain level controlled  Post-op Vital Signs: Reviewed and stable  Complications: No apparent anesthesia complications

## 2011-05-30 NOTE — H&P (View-Only) (Signed)
Patient ID: [name], female   DOB: 01/25/1946, 66 y.o.   MRN: [id number]  Chief Complaint  Patient presents with  . Other    new pt- eval ventral hernia    HPI [name] is a 66 y.o. female.  She has recently noticed a bulge that is in the right lower quadrant of her abdomen. She has been doing a lot of exercises as part of her rehabilitation both for her recent hip replacement as well as her back tissues. The area has become more tender. A CAT scan was done showing what appears to be a small hernia in the area. She comes in to discuss possible repair.  She's not really have any GI symptoms from this. It is painful she stands up or strains. It's been tender. It does seem to go away and become a flat when she lies down.  She is scheduled for a colonoscopy in about 10 days. HPI  Past Medical History  Diagnosis Date  . Diverticulosis   . GERD (gastroesophageal reflux disease)   . Esophageal stricture   . Hemorrhoids   . Mallory - Weiss tear   . Interstitial cystitis   . ADHD (attention deficit hyperactivity disorder)   . Breast cancer     right side  . Asthma   . Arthritis   . Blood transfusion   . Thyroid disease     Past Surgical History  Procedure Date  . Mastectomy modified radical     right; with immediate reconstruction  . Laparoscopy 1973  . Abdominal hysterectomy 1974  . Cystoscopy 1975, 2007  . Myringoplasty 1962  . Tympanoplasty 1973    right  . Oophorectomy 1982  . Bladder suspension   . Rectocele repair     with cystocele repair  . Breast biopsy 2002  . Total hip arthroplasty     Family History  Problem Relation Age of Onset  . Heart disease Father   . Diabetes      aunt  . Uterine cancer      grandmother  . Colon cancer Mother   . Depression Mother   . Cancer Mother     colon  . Pancreatic cancer Sister   . Cancer Sister     pancreatic  . Cancer Maternal Grandmother     ovarian    Social History History  Substance  Use Topics  . Smoking status: Never Smoker   . Smokeless tobacco: Never Used  . Alcohol Use: Yes     nightly    Allergies  Allergen Reactions  . Anesthetics, Amide     Patient unsure of names, however multiples cause swelling of airway  . Ciprofloxacin     REACTION: joint swelling  . Codeine   . Meperidine Hcl   . Morphine   . Nitrofurantoin     REACTION: neuropathy in legs  . Penicillins   . Sulfonamide Derivatives     REACTION: swelling, anaphylaxis  . Latex Rash    Current Outpatient Prescriptions  Medication Sig Dispense Refill  . amphetamine-dextroamphetamine (ADDERALL) 10 MG tablet Take 10 mg by mouth daily.        . bifidobacterium infantis (ALIGN) capsule Take 1 capsule by mouth daily.  1 capsule  0  . buPROPion (WELLBUTRIN SR) 150 MG 12 hr tablet Take 150 mg by mouth 3 (three) times daily.        . citalopram (CELEXA) 40 MG tablet Take 40 mg by mouth daily.        .   clonazePAM (KLONOPIN) 1 MG tablet Take 1 mg by mouth 3 (three) times daily as needed.        . esomeprazole (NEXIUM) 40 MG capsule Take 40 mg by mouth every other day.        . estradiol (ESTRING) 2 MG vaginal ring Place 2 mg vaginally every 3 (three) months. follow package directions       . estradiol (VIVELLE-DOT) 0.025 MG/24HR Place 1 patch onto the skin 2 (two) times a week.        . fexofenadine (ALLEGRA) 180 MG tablet Take 180 mg by mouth daily.        . levothyroxine (SYNTHROID, LEVOTHROID) 112 MCG tablet Take 112 mcg by mouth daily.        . pirbuterol (MAXAIR) 200 MCG/INH inhaler Inhale 2 puffs into the lungs 4 (four) times daily as needed.        . triamterene-hydrochlorothiazide (MAXZIDE) 75-50 MG per tablet Take 1 tablet by mouth daily.          Review of Systems Review of Systems  Constitutional: Negative for fever, chills and unexpected weight change.  HENT: Positive for hearing loss and congestion. Negative for sore throat, trouble swallowing and voice change.   Eyes: Negative for visual  disturbance.  Respiratory: Negative for cough and wheezing.   Cardiovascular: Negative for chest pain, palpitations and leg swelling.  Gastrointestinal: Positive for abdominal pain and constipation. Negative for nausea, vomiting, diarrhea, blood in stool, abdominal distention and anal bleeding.  Genitourinary: Negative for hematuria, vaginal bleeding and difficulty urinating.  Musculoskeletal: Positive for arthralgias.  Skin: Negative for rash and wound.  Neurological: Negative for seizures, syncope and headaches.  Hematological: Negative for adenopathy. Bruises/bleeds easily.  Psychiatric/Behavioral: Negative for confusion.    Blood pressure 116/70, pulse 72, temperature 98.6 F (37 C), temperature source Temporal, resp. rate 12, height 5' 7" (1.702 m), weight 139 lb (63.05 kg).  Physical Exam Physical Exam  Vitals reviewed. Constitutional: She is oriented to person, place, and time. She appears well-developed and well-nourished. No distress.  HENT:  Head: Normocephalic and atraumatic.  Mouth/Throat: Oropharynx is clear and moist.  Eyes: Conjunctivae and EOM are normal. Pupils are equal, round, and reactive to light. No scleral icterus.  Neck: Normal range of motion. Neck supple. No tracheal deviation present. No thyromegaly present.  Cardiovascular: Normal rate, regular rhythm, normal heart sounds and intact distal pulses.  Exam reveals no gallop and no friction rub.   No murmur heard. Pulmonary/Chest: Effort normal and breath sounds normal. No respiratory distress. She has no wheezes. She has no rales.  Abdominal: Soft. Bowel sounds are normal. She exhibits mass. She exhibits no distension. There is no tenderness. There is no rebound and no guarding.       The is a bulge in the RLQ consistent with a Spigelian hernia. It is tender when protruding, but reduces as soon as she is supine  Musculoskeletal: Normal range of motion. She exhibits no edema and no tenderness.  Neurological: She  is alert and oriented to person, place, and time.  Skin: Skin is warm and dry. No rash noted. She is not diaphoretic. No erythema.  Psychiatric: She has a normal mood and affect. Her behavior is normal. Judgment and thought content normal.    Data Reviewed Reviewed her Ct and Epic chart  Assessment    Spigelian hernia RLQ    Plan    Repair as OP with mesh. Discussed and explained surgery risks etc with patient and   her husband and all questions answered.Will do sometime after she has her colonoscopy.        Brantlee Penn J 05/04/2011, 10:06 AM    

## 2011-05-30 NOTE — Anesthesia Preprocedure Evaluation (Addendum)
Anesthesia Evaluation  Patient identified by MRN, date of birth, ID band Patient awake    Reviewed: Allergy & Precautions, H&P , NPO status , Patient's Chart, lab work & pertinent test results  History of Anesthesia Complications (+) PONV  Airway Mallampati: I TM Distance: >3 FB Neck ROM: Full    Dental No notable dental hx. (+) Caps and Dental Advisory Given   Pulmonary asthma (daily inhaler use) ,  clear to auscultation        Cardiovascular neg cardio ROS Regular Normal '10 ECHO- normal LVF, normal valves '10 stress test- no ischemia, EF 81%   Neuro/Psych Anxiety Depression    GI/Hepatic Neg liver ROS, GERD-  Medicated and Controlled,  Endo/Other  Hyperthyroidism (treated)   Renal/GU negative Renal ROS     Musculoskeletal  (+) Fibromyalgia -  Abdominal   Peds  Hematology negative hematology ROS (+)   Anesthesia Other Findings Multiple drug allergies listed- patient has never required emergency services Patient has received Bupivacaine with good results and no adverse effects  Reproductive/Obstetrics                           Anesthesia Physical Anesthesia Plan  ASA: III  Anesthesia Plan: General   Post-op Pain Management:    Induction: Intravenous  Airway Management Planned: LMA  Additional Equipment:   Intra-op Plan:   Post-operative Plan:   Informed Consent: I have reviewed the patients History and Physical, chart, labs and discussed the procedure including the risks, benefits and alternatives for the proposed anesthesia with the patient or authorized representative who has indicated his/her understanding and acceptance.   Dental advisory given and Dental Advisory Given  Plan Discussed with: CRNA and Surgeon  Anesthesia Plan Comments: (Plan routine monitors, GA- LMA ok)       Anesthesia Quick Evaluation

## 2011-05-30 NOTE — Transfer of Care (Signed)
Immediate Anesthesia Transfer of Care Note  Patient: [name]  Procedure(s) Performed:  HERNIA REPAIR VENTRAL ADULT - repair right spigelian hernia  Patient Location: PACU  Anesthesia Type: General  Level of Consciousness: awake, oriented and patient cooperative  Airway & Oxygen Therapy: Patient Spontanous Breathing and Patient connected to face mask oxygen  Post-op Assessment: Report given to PACU RN, Post -op Vital signs reviewed and stable and Patient moving all extremities  Post vital signs: Reviewed and stable Filed Vitals:   05/30/11 0903  BP: 119/77  Pulse: 65  Temp: 35.7 C  Resp: 16    Complications: No apparent anesthesia complications

## 2011-05-31 ENCOUNTER — Encounter (HOSPITAL_BASED_OUTPATIENT_CLINIC_OR_DEPARTMENT_OTHER): Payer: Self-pay | Admitting: Surgery

## 2011-06-20 ENCOUNTER — Encounter (INDEPENDENT_AMBULATORY_CARE_PROVIDER_SITE_OTHER): Payer: Self-pay | Admitting: Surgery

## 2011-06-20 ENCOUNTER — Ambulatory Visit (INDEPENDENT_AMBULATORY_CARE_PROVIDER_SITE_OTHER): Payer: Medicare Other | Admitting: Surgery

## 2011-06-20 VITALS — BP 106/78 | HR 80 | Temp 96.9°F | Resp 12 | Ht 67.0 in | Wt 134.6 lb

## 2011-06-20 DIAGNOSIS — J40 Bronchitis, not specified as acute or chronic: Secondary | ICD-10-CM

## 2011-06-20 DIAGNOSIS — Z09 Encounter for follow-up examination after completed treatment for conditions other than malignant neoplasm: Secondary | ICD-10-CM

## 2011-06-20 MED ORDER — CEPHALEXIN 500 MG PO CAPS: 500.0000 mg | ORAL_CAPSULE | Freq: Four times a day (QID) | ORAL | Status: AC

## 2011-06-20 NOTE — Patient Instructions (Signed)
We will see you again on an as needed basis. Please call the office at [phone] if you have any questions or concerns. Thank you for allowing us to take care of you.  

## 2011-06-20 NOTE — Progress Notes (Signed)
NAME: [name]                                            DOB: 10-07-1945 DATE: 06/20/2011                                                  MRN: [id number]  CC: Post op   HPI: This patient comes in for post op follow-up.Sheunderwent repair of a Right Spigelian hernia on 05/30/11. She feels that she is doing well.She has developed a bronchitis and now has a cough productive of brownish sputum PE: General: The patient appears to be healthy, NAD Wound clean and healing  DATA REVIEWED: No new  IMPRESSION: The patient is doing well S/P hernia repair, developed bronchitis.    PLAN: RTC PRN. Gave her an RX for Keflex 500 q 6h until she can be seen by her primary physician or allergist

## 2011-07-18 DIAGNOSIS — Z79899 Other long term (current) drug therapy: Secondary | ICD-10-CM | POA: Diagnosis not present

## 2011-07-18 DIAGNOSIS — R000 Tachycardia, unspecified: Secondary | ICD-10-CM | POA: Diagnosis not present

## 2011-07-18 DIAGNOSIS — K219 Gastro-esophageal reflux disease without esophagitis: Secondary | ICD-10-CM | POA: Diagnosis not present

## 2011-07-19 DIAGNOSIS — R300 Dysuria: Secondary | ICD-10-CM | POA: Diagnosis not present

## 2011-07-19 DIAGNOSIS — N952 Postmenopausal atrophic vaginitis: Secondary | ICD-10-CM | POA: Diagnosis not present

## 2011-07-19 DIAGNOSIS — Z124 Encounter for screening for malignant neoplasm of cervix: Secondary | ICD-10-CM | POA: Diagnosis not present

## 2011-07-19 DIAGNOSIS — Z01419 Encounter for gynecological examination (general) (routine) without abnormal findings: Secondary | ICD-10-CM | POA: Diagnosis not present

## 2011-07-20 DIAGNOSIS — J45901 Unspecified asthma with (acute) exacerbation: Secondary | ICD-10-CM | POA: Diagnosis not present

## 2011-07-20 DIAGNOSIS — Z01419 Encounter for gynecological examination (general) (routine) without abnormal findings: Secondary | ICD-10-CM | POA: Diagnosis not present

## 2011-07-25 DIAGNOSIS — J45909 Unspecified asthma, uncomplicated: Secondary | ICD-10-CM | POA: Diagnosis not present

## 2011-07-25 DIAGNOSIS — C50919 Malignant neoplasm of unspecified site of unspecified female breast: Secondary | ICD-10-CM | POA: Diagnosis not present

## 2011-07-25 DIAGNOSIS — Z0000 Encounter for general adult medical examination without abnormal findings: Secondary | ICD-10-CM | POA: Diagnosis not present

## 2011-07-25 DIAGNOSIS — K219 Gastro-esophageal reflux disease without esophagitis: Secondary | ICD-10-CM | POA: Diagnosis not present

## 2011-08-02 DIAGNOSIS — J309 Allergic rhinitis, unspecified: Secondary | ICD-10-CM | POA: Diagnosis not present

## 2011-08-07 DIAGNOSIS — N3010 Interstitial cystitis (chronic) without hematuria: Secondary | ICD-10-CM | POA: Diagnosis not present

## 2011-08-07 DIAGNOSIS — J309 Allergic rhinitis, unspecified: Secondary | ICD-10-CM | POA: Diagnosis not present

## 2011-08-07 DIAGNOSIS — J45909 Unspecified asthma, uncomplicated: Secondary | ICD-10-CM | POA: Diagnosis not present

## 2011-08-08 DIAGNOSIS — N3010 Interstitial cystitis (chronic) without hematuria: Secondary | ICD-10-CM | POA: Diagnosis not present

## 2011-08-09 DIAGNOSIS — N3010 Interstitial cystitis (chronic) without hematuria: Secondary | ICD-10-CM | POA: Diagnosis not present

## 2011-08-14 DIAGNOSIS — N3010 Interstitial cystitis (chronic) without hematuria: Secondary | ICD-10-CM | POA: Diagnosis not present

## 2011-08-16 DIAGNOSIS — N3010 Interstitial cystitis (chronic) without hematuria: Secondary | ICD-10-CM | POA: Diagnosis not present

## 2011-08-20 DIAGNOSIS — N3010 Interstitial cystitis (chronic) without hematuria: Secondary | ICD-10-CM | POA: Diagnosis not present

## 2011-08-22 DIAGNOSIS — N3010 Interstitial cystitis (chronic) without hematuria: Secondary | ICD-10-CM | POA: Diagnosis not present

## 2011-08-24 DIAGNOSIS — J309 Allergic rhinitis, unspecified: Secondary | ICD-10-CM | POA: Diagnosis not present

## 2011-08-24 DIAGNOSIS — N3010 Interstitial cystitis (chronic) without hematuria: Secondary | ICD-10-CM | POA: Diagnosis not present

## 2011-08-27 DIAGNOSIS — N3010 Interstitial cystitis (chronic) without hematuria: Secondary | ICD-10-CM | POA: Diagnosis not present

## 2011-08-28 ENCOUNTER — Other Ambulatory Visit: Payer: Self-pay | Admitting: Urology

## 2011-08-28 MED ORDER — KETOROLAC TROMETHAMINE 30 MG/ML IJ SOLN: 30.0000 mg | INTRAMUSCULAR | Status: AC

## 2011-08-28 MED ORDER — PHENAZOPYRIDINE HCL 200 MG PO TABS: Freq: Once | ORAL | Status: AC

## 2011-08-28 MED ORDER — TRIAMCINOLONE ACETONIDE 40 MG/ML IJ SUSP: Freq: Once | INTRAMUSCULAR | Status: AC

## 2011-08-29 ENCOUNTER — Encounter (HOSPITAL_COMMUNITY): Payer: Self-pay | Admitting: Pharmacy Technician

## 2011-08-29 DIAGNOSIS — N3010 Interstitial cystitis (chronic) without hematuria: Secondary | ICD-10-CM | POA: Diagnosis not present

## 2011-08-31 ENCOUNTER — Encounter (HOSPITAL_COMMUNITY): Payer: Self-pay

## 2011-08-31 ENCOUNTER — Ambulatory Visit (HOSPITAL_COMMUNITY)
Admission: RE | Admit: 2011-08-31 | Discharge: 2011-08-31 | Disposition: A | Discharge status: Home / Self Care | Payer: Medicare Other | Source: Ambulatory Visit | Attending: Urology | Admitting: Urology

## 2011-08-31 ENCOUNTER — Encounter (HOSPITAL_COMMUNITY)
Admission: RE | Admit: 2011-08-31 | Discharge: 2011-08-31 | Disposition: A | Discharge status: Home / Self Care | Payer: Medicare Other | Source: Ambulatory Visit | Attending: Urology | Admitting: Urology

## 2011-08-31 DIAGNOSIS — Z01811 Encounter for preprocedural respiratory examination: Secondary | ICD-10-CM | POA: Diagnosis not present

## 2011-08-31 DIAGNOSIS — Z01818 Encounter for other preprocedural examination: Secondary | ICD-10-CM | POA: Diagnosis not present

## 2011-08-31 DIAGNOSIS — Z01812 Encounter for preprocedural laboratory examination: Secondary | ICD-10-CM | POA: Insufficient documentation

## 2011-08-31 DIAGNOSIS — N3010 Interstitial cystitis (chronic) without hematuria: Secondary | ICD-10-CM | POA: Diagnosis not present

## 2011-08-31 DIAGNOSIS — J984 Other disorders of lung: Secondary | ICD-10-CM | POA: Diagnosis not present

## 2011-08-31 HISTORY — DX: Acute upper respiratory infection, unspecified: J06.9

## 2011-08-31 HISTORY — DX: Personal history of other diseases of the digestive system: Z87.19

## 2011-08-31 HISTORY — DX: Myoneural disorder, unspecified: G70.9

## 2011-08-31 LAB — CBC
MCH: 31.8 pg (ref 26.0–34.0)
MCHC: 34 g/dL (ref 30.0–36.0)
Platelets: 219 10*3/uL (ref 150–400)
RBC: 4.24 MIL/uL (ref 3.87–5.11)

## 2011-08-31 LAB — BASIC METABOLIC PANEL
Calcium: 9.3 mg/dL (ref 8.4–10.5)
GFR calc Af Amer: >90 mL/min (ref >90)
GFR calc non Af Amer: 89 mL/min — ABNORMAL LOW (ref >90)
Sodium: 135 mEq/L (ref 135–145)

## 2011-08-31 LAB — SURGICAL PCR SCREEN
MRSA, PCR: NEGATIVE
Staphylococcus aureus: NEGATIVE

## 2011-08-31 NOTE — Patient Instructions (Signed)
20 [name]  08/31/2011   Your procedure is scheduled on:  09/07/11   Surgery [id number]  Report to [facility] at  0630     AM.  Call this number if you have problems the morning of surgery: [phone]     Or PST   [id number]  [facility]   Remember:   Do not eat food     Or drinks fluids  After midnight Thursday NIGHT:After Midnight.     BRING INHALERS WITH YOU TO HOSPITAL   Clear liquids include soda, tea, black coffee, apple or grape juice, broth.  Take these medicines the morning of surgery with A SIP OF WATER:Celexa,Nexium,Allegra, Wellbutrin, Synthroid   Asmanex             Ventolin if needed   Do not wear jewelry, make-up or nail polish.  Do not wear lotions, powders, or perfumes. You may wear deodorant.  Do not shave 48 hours prior to surgery.  Do not bring valuables to the hospital.  Contacts, dentures or bridgework may not be worn into surgery.  Leave suitcase in the car. After surgery it may be brought to your room.  For patients admitted to the hospital, checkout time is 11:00 AM the day of discharge.   Patients discharged the day of surgery will not be allowed to drive home.  Name and phone number of your driver: driver                                                                     Special Instructions: CHG Shower Use Special Wash: 1/2 bottle night before surgery and 1/2 bottle morning of surgery. REGULAR SOAP FACE AND PRIVATES              LADIES- NO SHAVING 48 HOURS BEFORE USING BETASEPT SOAP.                  Please read over the following fact sheets that you were given: MRSA Information

## 2011-08-31 NOTE — Pre-Procedure Instructions (Signed)
Per patient- will see Dr [name] for clearance and guidelines regarding ? Depo Medrol injection- states he has told patient he desires epidural. Copies of anesthesia record from 7/12 on chart.  Eccho report  10/12 on chart with holter monitor report,

## 2011-09-03 DIAGNOSIS — H903 Sensorineural hearing loss, bilateral: Secondary | ICD-10-CM | POA: Diagnosis not present

## 2011-09-03 DIAGNOSIS — H905 Unspecified sensorineural hearing loss: Secondary | ICD-10-CM | POA: Diagnosis not present

## 2011-09-04 DIAGNOSIS — N3010 Interstitial cystitis (chronic) without hematuria: Secondary | ICD-10-CM | POA: Diagnosis not present

## 2011-09-04 DIAGNOSIS — K219 Gastro-esophageal reflux disease without esophagitis: Secondary | ICD-10-CM | POA: Diagnosis not present

## 2011-09-04 DIAGNOSIS — J45909 Unspecified asthma, uncomplicated: Secondary | ICD-10-CM | POA: Diagnosis not present

## 2011-09-04 DIAGNOSIS — F909 Attention-deficit hyperactivity disorder, unspecified type: Secondary | ICD-10-CM | POA: Diagnosis not present

## 2011-09-05 NOTE — Pre-Procedure Instructions (Signed)
Spoke with Chastity at [facility] 09/03/11 informing her of patients allergies regarding Ancef pre op.   Received order today for Gentamycin 120mg  IV x 1. Spoke with Orinda Kenner regarding allergy warning. She stated to get clarification from MD about dermatitis allergy. Spoke with Chastity who will clarify with MD.  Requested office note Dr [name] from this week from his office

## 2011-09-05 NOTE — Pre-Procedure Instructions (Signed)
PER CHASTITY at [facility]-  MD STATED GENTAMYCIN PRE OP  "OK"

## 2011-09-06 ENCOUNTER — Encounter (HOSPITAL_COMMUNITY): Payer: Self-pay

## 2011-09-07 ENCOUNTER — Encounter (HOSPITAL_COMMUNITY)
Admission: RE | Disposition: A | Discharge status: Home / Self Care | Payer: Self-pay | Source: Ambulatory Visit | Attending: Urology

## 2011-09-07 ENCOUNTER — Encounter (HOSPITAL_COMMUNITY): Payer: Self-pay | Admitting: Anesthesiology

## 2011-09-07 ENCOUNTER — Ambulatory Visit (HOSPITAL_COMMUNITY)
Admission: RE | Admit: 2011-09-07 | Discharge: 2011-09-07 | Disposition: A | Discharge status: Home / Self Care | Payer: Medicare Other | Source: Ambulatory Visit | Attending: Urology | Admitting: Urology

## 2011-09-07 ENCOUNTER — Ambulatory Visit (HOSPITAL_COMMUNITY): Payer: Medicare Other | Admitting: Anesthesiology

## 2011-09-07 ENCOUNTER — Encounter (HOSPITAL_COMMUNITY): Payer: Self-pay | Admitting: *Deleted

## 2011-09-07 DIAGNOSIS — M899 Disorder of bone, unspecified: Secondary | ICD-10-CM

## 2011-09-07 DIAGNOSIS — M949 Disorder of cartilage, unspecified: Secondary | ICD-10-CM

## 2011-09-07 DIAGNOSIS — R300 Dysuria: Secondary | ICD-10-CM | POA: Insufficient documentation

## 2011-09-07 DIAGNOSIS — R350 Frequency of micturition: Secondary | ICD-10-CM | POA: Diagnosis not present

## 2011-09-07 DIAGNOSIS — M1990 Unspecified osteoarthritis, unspecified site: Secondary | ICD-10-CM

## 2011-09-07 DIAGNOSIS — J45909 Unspecified asthma, uncomplicated: Secondary | ICD-10-CM | POA: Diagnosis not present

## 2011-09-07 DIAGNOSIS — E039 Hypothyroidism, unspecified: Secondary | ICD-10-CM | POA: Diagnosis not present

## 2011-09-07 DIAGNOSIS — K439 Ventral hernia without obstruction or gangrene: Secondary | ICD-10-CM

## 2011-09-07 DIAGNOSIS — N3010 Interstitial cystitis (chronic) without hematuria: Secondary | ICD-10-CM | POA: Diagnosis not present

## 2011-09-07 DIAGNOSIS — K449 Diaphragmatic hernia without obstruction or gangrene: Secondary | ICD-10-CM

## 2011-09-07 DIAGNOSIS — Z79899 Other long term (current) drug therapy: Secondary | ICD-10-CM | POA: Diagnosis not present

## 2011-09-07 DIAGNOSIS — R0602 Shortness of breath: Secondary | ICD-10-CM

## 2011-09-07 DIAGNOSIS — K2100 Gastro-esophageal reflux disease with esophagitis, without bleeding: Secondary | ICD-10-CM | POA: Diagnosis not present

## 2011-09-07 DIAGNOSIS — C50919 Malignant neoplasm of unspecified site of unspecified female breast: Secondary | ICD-10-CM

## 2011-09-07 HISTORY — PX: CYSTO WITH HYDRODISTENSION: SHX5453

## 2011-09-07 SURGERY — CYSTOSCOPY, WITH BLADDER HYDRODISTENSION
Anesthesia: General | Site: Bladder | Wound class: Clean Contaminated

## 2011-09-07 MED ORDER — PHENAZOPYRIDINE HCL 200 MG PO TABS: 200.0000 mg | ORAL_TABLET | Freq: Three times a day (TID) | ORAL | Status: AC | PRN

## 2011-09-07 MED ORDER — ONDANSETRON HCL 4 MG/2ML IJ SOLN
INTRAMUSCULAR | Status: DC | PRN
  Administered 2011-09-07: 4 mg via INTRAVENOUS

## 2011-09-07 MED ORDER — CHLOROPROCAINE HCL 1 % IJ SOLN
INTRAMUSCULAR | Status: DC | PRN
  Administered 2011-09-07: 10 mL

## 2011-09-07 MED ORDER — ACETAMINOPHEN 10 MG/ML IV SOLN
INTRAVENOUS | Status: AC
  Filled 2011-09-07: qty 100

## 2011-09-07 MED ORDER — KETOROLAC TROMETHAMINE 30 MG/ML IJ SOLN
INTRAMUSCULAR | Status: DC | PRN
  Administered 2011-09-07: 30 mg via INTRAVENOUS

## 2011-09-07 MED ORDER — STERILE WATER FOR IRRIGATION IR SOLN
Status: DC | PRN
  Administered 2011-09-07: 3000 mL

## 2011-09-07 MED ORDER — NONFORMULARY OR COMPOUNDED ITEM
Freq: Once | Status: DC
  Filled 2011-09-07: qty 1

## 2011-09-07 MED ORDER — DEXTROSE 5 % IV SOLN
120.0000 mg | Freq: Once | INTRAVENOUS | Status: AC
  Administered 2011-09-07: 120 mg via INTRAVENOUS
  Filled 2011-09-07: qty 3

## 2011-09-07 MED ORDER — FENTANYL CITRATE 0.05 MG/ML IJ SOLN: 25.0000 ug | INTRAMUSCULAR | Status: DC | PRN

## 2011-09-07 MED ORDER — ACETAMINOPHEN 10 MG/ML IV SOLN: 1000.0000 mg | Freq: Four times a day (QID) | INTRAVENOUS | Status: DC

## 2011-09-07 MED ORDER — LACTATED RINGERS IV SOLN
INTRAVENOUS | Status: DC
  Administered 2011-09-07: 09:00:00 via INTRAVENOUS

## 2011-09-07 MED ORDER — EPHEDRINE SULFATE 50 MG/ML IJ SOLN
INTRAMUSCULAR | Status: DC | PRN
  Administered 2011-09-07: 5 mg via INTRAVENOUS

## 2011-09-07 MED ORDER — HYDROCODONE-ACETAMINOPHEN 7.5-650 MG PO TABS: 1.0000 | ORAL_TABLET | Freq: Four times a day (QID) | ORAL | Status: AC | PRN

## 2011-09-07 MED ORDER — SCOPOLAMINE 1 MG/3DAYS TD PT72
MEDICATED_PATCH | TRANSDERMAL | Status: AC
  Filled 2011-09-07: qty 1

## 2011-09-07 MED ORDER — PROPOFOL 10 MG/ML IV BOLUS
INTRAVENOUS | Status: DC | PRN
  Administered 2011-09-07: 140 mg via INTRAVENOUS
  Administered 2011-09-07: 40 mg via INTRAVENOUS

## 2011-09-07 MED ORDER — CHLOROPROCAINE HCL 1 % IJ SOLN
INTRAMUSCULAR | Status: DC | PRN
  Administered 2011-09-07: 20 mL

## 2011-09-07 MED ORDER — PHENAZOPYRIDINE HCL 200 MG PO TABS: ORAL_TABLET | Freq: Once | ORAL | Status: DC

## 2011-09-07 MED ORDER — SCOPOLAMINE 1 MG/3DAYS TD PT72
1.0000 | MEDICATED_PATCH | TRANSDERMAL | Status: DC
  Administered 2011-09-07: 1.5 mg via TRANSDERMAL

## 2011-09-07 MED ORDER — MIDAZOLAM HCL 5 MG/5ML IJ SOLN
INTRAMUSCULAR | Status: DC | PRN
  Administered 2011-09-07 (×2): 1 mg via INTRAVENOUS

## 2011-09-07 MED ORDER — TRIAMCINOLONE ACETONIDE 40 MG/ML IJ SUSP: 40.0000 mg | Freq: Once | INTRAMUSCULAR | Status: DC

## 2011-09-07 MED ORDER — ACETAMINOPHEN 10 MG/ML IV SOLN
INTRAVENOUS | Status: DC | PRN
  Administered 2011-09-07: 1000 mg via INTRAVENOUS

## 2011-09-07 MED ORDER — FENTANYL CITRATE 0.05 MG/ML IJ SOLN
INTRAMUSCULAR | Status: DC | PRN
  Administered 2011-09-07 (×2): 25 ug via INTRAVENOUS

## 2011-09-07 MED ORDER — LACTATED RINGERS IV SOLN
INTRAVENOUS | Status: DC
  Administered 2011-09-07: 1000 mL via INTRAVENOUS

## 2011-09-07 SURGICAL SUPPLY — 18 items
BAG URO CATCHER STRL LF (DRAPE) ×2 IMPLANT
CATH ROBINSON RED A/P 16FR (CATHETERS) ×2 IMPLANT
CATH ROBINSON RED A/P 18FR (CATHETERS) IMPLANT
CATH SILICONE 5CC 18FR (INSTRUMENTS) ×2 IMPLANT
CLOTH BEACON ORANGE TIMEOUT ST (SAFETY) ×2 IMPLANT
DRAPE CAMERA CLOSED 9X96 (DRAPES) ×2 IMPLANT
GLOVE BIOGEL M STRL SZ7.5 (GLOVE) ×2 IMPLANT
GOWN STRL NON-REIN LRG LVL3 (GOWN DISPOSABLE) ×2 IMPLANT
GOWN STRL REIN XL XLG (GOWN DISPOSABLE) ×2 IMPLANT
MANIFOLD NEPTUNE II (INSTRUMENTS) ×2 IMPLANT
NDL SAFETY ECLIPSE 18X1.5 (NEEDLE) IMPLANT
NEEDLE HYPO 18GX1.5 SHARP (NEEDLE)
NEEDLE HYPO 22GX1.5 SAFETY (NEEDLE) IMPLANT
PACK CYSTO (CUSTOM PROCEDURE TRAY) ×2 IMPLANT
SCRUB PCMX 4 OZ (MISCELLANEOUS) ×2 IMPLANT
SYR BULB IRRIGATION 50ML (SYRINGE) IMPLANT
TUBING CONNECTING 10 (TUBING) IMPLANT
WATER STERILE IRR 1500ML POUR (IV SOLUTION) IMPLANT

## 2011-09-07 NOTE — Anesthesia Postprocedure Evaluation (Signed)
  Anesthesia Post-op Note  Patient: [name]  Procedure(s) Performed: Procedure(s) (LRB): CYSTOSCOPY/HYDRODISTENSION (N/A)  Patient Location: PACU  Anesthesia Type: General  Level of Consciousness: awake and alert   Airway and Oxygen Therapy: Patient Spontanous Breathing  Post-op Pain: mild  Post-op Assessment: Post-op Vital signs reviewed, Patient's Cardiovascular Status Stable, Respiratory Function Stable, Patent Airway and No signs of Nausea or vomiting  Post-op Vital Signs: stable  Complications: No apparent anesthesia complications

## 2011-09-07 NOTE — Anesthesia Preprocedure Evaluation (Addendum)
Anesthesia Evaluation  Patient identified by MRN, date of birth, ID band Patient awake    Reviewed: Allergy & Precautions, H&P , NPO status , Patient's Chart, lab work & pertinent test results, reviewed documented beta blocker date and time   History of Anesthesia Complications (+) PONV  Airway Mallampati: II TM Distance: >3 FB Neck ROM: full    Dental No notable dental hx. (+) Caps and Dental Advisory Given All teeth capped:   Pulmonary neg pulmonary ROS, shortness of breath and with exertion, asthma , pneumonia , Recent URI ,  Recurrent bronchitis 1/13 to present.  Has clearance Dr. [name] 09/06/11 breath sounds clear to auscultation  Pulmonary exam normal       Cardiovascular Exercise Tolerance: Good negative cardio ROS  Rhythm:regular Rate:Normal     Neuro/Psych Depression An MS type disorder.  Neuromuscular disease negative neurological ROS  negative psych ROS   GI/Hepatic negative GI ROS, Neg liver ROS, hiatal hernia, GERD-  Medicated and Controlled,Mallory weiss tear.  esoph stricture   Endo/Other  negative endocrine ROS  Renal/GU negative Renal ROS  negative genitourinary   Musculoskeletal   Abdominal   Peds  Hematology negative hematology ROS (+)   Anesthesia Other Findings   Reproductive/Obstetrics negative OB ROS                          Anesthesia Physical Anesthesia Plan  ASA: III  Anesthesia Plan: General   Post-op Pain Management:    Induction: Intravenous  Airway Management Planned: LMA  Additional Equipment:   Intra-op Plan:   Post-operative Plan:   Informed Consent: I have reviewed the patients History and Physical, chart, labs and discussed the procedure including the risks, benefits and alternatives for the proposed anesthesia with the patient or authorized representative who has indicated his/her understanding and acceptance.   Dental Advisory  Given  Plan Discussed with: CRNA and Surgeon  Anesthesia Plan Comments:        Anesthesia Quick Evaluation

## 2011-09-07 NOTE — Transfer of Care (Signed)
Immediate Anesthesia Transfer of Care Note  Patient: [name]  Procedure(s) Performed: Procedure(s) (LRB): CYSTOSCOPY/HYDRODISTENSION (N/A)  Patient Location: PACU  Anesthesia Type: General  Level of Consciousness: sedated, patient cooperative and responds to stimulaton  Airway & Oxygen Therapy: Patient Spontanous Breathing and Patient connected to face mask oxgen  Post-op Assessment: Report given to PACU RN and Post -op Vital signs reviewed and stable  Post vital signs: Reviewed and stable  Complications: No apparent anesthesia complications

## 2011-09-07 NOTE — Op Note (Signed)
Pre-operative diagnosis : Interstitial Cystitis  Postoperative diagnosis: Same  Operation: Cystoscopy, Hydrodistention,( 600-650cc)  Instillation of Pyridium/Nesicaine; Injection of Marcaine/Nesacaine)  Surgeon: Imelda Pillow, MD  Anesthesia:Gen LMA  Preparation: After appropriate pre-anesthesia, the patient is brought to the operating room and placed upon the operating table in the supine position, where general LMA anesthesia is introduced. She is replaced in the dorsal lithotomy position, and the pubis is prepped with betadine solution and draped in usual fashion. Armband is double checked.   Review history: Mrs. Blassingame is a 66 yo female with a long hx of IC, failing level 1 and 2 therapies, now for cysto, HOD-which has helped her in the past. Because of her multiple allergies ( LATEX and amide anesthetics), pharmacy was consulted, and Nesacaine was substituted for Marcaine.   Statement of  Likelihood of Success: Excellent. TIME-OUT observed.:  Procedure: The bladder is cystoscopically hydrodistended from 600 to 650cc.  Microulcers are photodocumented. Nesacaine/pyridium solution is instilled into the bladder and Nesicaine/kenalog is injected into the subtrigonal space.   The patient received IV Tylenol, and IV Toradol.  She was awakened and taken to the recovery room in good condition.

## 2011-09-07 NOTE — Interval H&P Note (Signed)
History and Physical Interval Note:  09/07/2011 8:41 AM  [name]  has presented today for surgery, with the diagnosis of Interstitial Cystitis  The various methods of treatment have been discussed with the patient and family. After consideration of risks, benefits and other options for treatment, the patient has consented to  Procedure(s) (LRB): CYSTOSCOPY/HYDRODISTENSION (N/A) as a surgical intervention .  The patients' history has been reviewed, patient examined, no change in status, stable for surgery.  I have reviewed the patients' chart and labs.  Questions were answered to the patient's satisfaction.     Jethro Bolus I

## 2011-09-07 NOTE — H&P (Signed)
Chief Complaint  cc: Dr. Marius Ditch   Reason For Visit  Abdominal pain   Active Problems Problems  1. Chronic Interstitial Cystitis 595.1 2. Chronic Reflux Esophagitis 530.11 3. Dysuria 788.1 4. Feelings Of Urinary Urgency 788.63 5. Muscle Spasm 728.85 6. Muscle Weakness 728.87 7. Poor Coordination 781.3 8. Postmenopausal Atrophic Vaginitis 627.3 9. Primary Hypothyroidism 244.9 10. Urinary Frequency 788.41  History of Present Illness      66 yo female, last seen 09/21/08, with a hx of IC, returns today because she started having severe LLQ abdominal pain & suprapubic pain last night.  She is s/p Rt hip replacement on 11/17/10, but has had severe issues with constipation since her surgery.  She is no longer taking medications for her IC.   Past Medical History Problems  1. History of  Asthma 493.90 2. History of  Chronic Interstitial Cystitis 595.1 3. History of  Esophageal Reflux 530.81 4. 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 History of  Hiatal Hernia 553.3 6. History of  Hypothyroidism 244.9 7. 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 History of  Mitral Valve Disorder 424.0 9. History of  Osteopenia 733.90  Surgical History Problems  1. History of  Breast Surgery Mastectomy Right V45.71 2. History of  Breast Surgery Reconstruction With TRAM 3. History of  Hysterectomy V45.77 4. History of  Neck Surgery 5. History of  Oophorectomy Bilateral V45.77 6. History of  Total Hip Replacement Right  Current Meds 1. Allegra TABS; Therapy: (Recorded:04Nov2008) to 2. CeleXA TABS; Therapy: (Recorded:04Nov2008) to 3. Estring 2 MG Vaginal Ring; Therapy: (Recorded:04Nov2008) to 4. NexIUM 40 MG Oral Capsule Delayed Release; Therapy: (Recorded:04Nov2008) to 5. Premarin TABS; Therapy: (Recorded:04Nov2008) to 6. Synthroid 100 MCG Oral Tablet; Therapy: (Recorded:21Oct2009) to 7. Wellbutrin TABS; Therapy: (Recorded:11May2010) to  Allergies Medication  1.  Phenazopyridine-Butabarb-Hyosc TABS 2. Sanctura TABS 3. Cipro TABS 4. Cleocin CAPS 5. Codeine Derivatives 6. Demerol TABS 7. Erythromycin Base TABS 8. Macrobid CAPS 9. Morphine Derivatives 10. Penicillins 11. Sulfa Drugs  Family History Problems  1. Paternal history of  Acute Myocardial Infarction V17.3 2. Maternal history of  Colon Cancer V16.0  Social History Problems  1. Activities Of Daily Living 2. Being A Social Drinker 3. Exercise Habits very active and regularly does Yoga. 4. Living Independently With Spouse 5. Marital History - Currently Married 6. Self-reliant In Usual Daily Activities Denied  7. Tobacco Use  Vitals Vital Signs [Data Includes: Last 1 Day]  24Oct2012 03:17PM  BMI Calculated: 20.4 BSA Calculated: 1.68 Height: 5 ft 7 in Weight: 130 lb  Blood Pressure: 117 / 76 Heart Rate: 99  Results/Data  07 Mar 2011 2:56 PM   UA With REFLEX       COLOR YELLOW       APPEARANCE CLEAR       SPECIFIC GRAVITY 1.015       pH 7.5       GLUCOSE NEG       BILIRUBIN NEG       KETONE TRACE       BLOOD NEG       PROTEIN NEG       UROBILINOGEN 0.2       NITRITE NEG       LEUKOCYTE ESTERASE NEG      Assessment Assessed  1. Chronic Interstitial Cystitis 595.1 2. Feelings Of Urinary Urgency 788.63 3. Urinary Frequency 788.41      66 yo female with possible IC flair, with coronic constipation. Will try Gelnique  for urinary frequency and urgency.   Plan Chronic Interstitial Cystitis (595.1)  1. Elmiron 100 MG Oral Capsule; take 1 capsule by mouth three times a day; Therapy: 20Jan2010  to 24Oct2012; Last Rx:20Jan2010; Status: DISCONTINUED Chronic Interstitial Cystitis (595.1), Feelings Of Urinary Urgency (788.63)  2. Gelnique 10 % Transdermal Gel; 3% gel pump bottle. Use 3 pumps and rub on chest and  shoulders; Therapy: 24Oct2012 to (Last Rx:24Oct2012) Unlinked  3. Diazepam 2 MG Oral Tablet; Take 1/2 tab in am and 1 whole pill @ hs; Status:  DISCONTINUED      Bladder irrigation today. She may need Elmiron bladder instillations.

## 2011-09-10 ENCOUNTER — Encounter (HOSPITAL_COMMUNITY): Payer: Self-pay | Admitting: Urology

## 2011-09-28 DIAGNOSIS — N3010 Interstitial cystitis (chronic) without hematuria: Secondary | ICD-10-CM | POA: Diagnosis not present

## 2011-10-11 DIAGNOSIS — J309 Allergic rhinitis, unspecified: Secondary | ICD-10-CM | POA: Diagnosis not present

## 2011-10-29 DIAGNOSIS — M1710 Unilateral primary osteoarthritis, unspecified knee: Secondary | ICD-10-CM | POA: Diagnosis not present

## 2011-10-30 DIAGNOSIS — J309 Allergic rhinitis, unspecified: Secondary | ICD-10-CM | POA: Diagnosis not present

## 2011-10-30 DIAGNOSIS — J45909 Unspecified asthma, uncomplicated: Secondary | ICD-10-CM | POA: Diagnosis not present

## 2011-12-10 DIAGNOSIS — J309 Allergic rhinitis, unspecified: Secondary | ICD-10-CM | POA: Diagnosis not present

## 2011-12-11 DIAGNOSIS — J45901 Unspecified asthma with (acute) exacerbation: Secondary | ICD-10-CM | POA: Diagnosis not present

## 2012-01-08 DIAGNOSIS — J309 Allergic rhinitis, unspecified: Secondary | ICD-10-CM | POA: Diagnosis not present

## 2012-01-10 DIAGNOSIS — J309 Allergic rhinitis, unspecified: Secondary | ICD-10-CM | POA: Diagnosis not present

## 2012-01-15 ENCOUNTER — Ambulatory Visit
Admission: RE | Admit: 2012-01-15 | Discharge: 2012-01-15 | Disposition: A | Discharge status: Home / Self Care | Payer: Medicare Other | Source: Ambulatory Visit | Attending: Allergy and Immunology | Admitting: Allergy and Immunology

## 2012-01-15 ENCOUNTER — Ambulatory Visit: Admission: RE | Admit: 2012-01-15 | Payer: Medicare Other | Source: Ambulatory Visit

## 2012-01-15 ENCOUNTER — Other Ambulatory Visit: Payer: Self-pay | Admitting: Allergy and Immunology

## 2012-01-15 DIAGNOSIS — J329 Chronic sinusitis, unspecified: Secondary | ICD-10-CM

## 2012-01-15 DIAGNOSIS — J309 Allergic rhinitis, unspecified: Secondary | ICD-10-CM | POA: Diagnosis not present

## 2012-01-15 DIAGNOSIS — J342 Deviated nasal septum: Secondary | ICD-10-CM | POA: Diagnosis not present

## 2012-01-15 DIAGNOSIS — J45909 Unspecified asthma, uncomplicated: Secondary | ICD-10-CM | POA: Diagnosis not present

## 2012-01-22 DIAGNOSIS — J309 Allergic rhinitis, unspecified: Secondary | ICD-10-CM | POA: Diagnosis not present

## 2012-01-22 DIAGNOSIS — J45909 Unspecified asthma, uncomplicated: Secondary | ICD-10-CM | POA: Diagnosis not present

## 2012-01-24 DIAGNOSIS — J301 Allergic rhinitis due to pollen: Secondary | ICD-10-CM | POA: Diagnosis not present

## 2012-01-24 DIAGNOSIS — J342 Deviated nasal septum: Secondary | ICD-10-CM | POA: Diagnosis not present

## 2012-01-24 DIAGNOSIS — K219 Gastro-esophageal reflux disease without esophagitis: Secondary | ICD-10-CM | POA: Diagnosis not present

## 2012-02-01 DIAGNOSIS — J309 Allergic rhinitis, unspecified: Secondary | ICD-10-CM | POA: Diagnosis not present

## 2012-02-04 DIAGNOSIS — J309 Allergic rhinitis, unspecified: Secondary | ICD-10-CM | POA: Diagnosis not present

## 2012-02-15 DIAGNOSIS — J309 Allergic rhinitis, unspecified: Secondary | ICD-10-CM | POA: Diagnosis not present

## 2012-03-21 DIAGNOSIS — J309 Allergic rhinitis, unspecified: Secondary | ICD-10-CM | POA: Diagnosis not present

## 2012-03-25 DIAGNOSIS — J309 Allergic rhinitis, unspecified: Secondary | ICD-10-CM | POA: Diagnosis not present

## 2012-04-01 DIAGNOSIS — J309 Allergic rhinitis, unspecified: Secondary | ICD-10-CM | POA: Diagnosis not present

## 2012-04-07 DIAGNOSIS — J309 Allergic rhinitis, unspecified: Secondary | ICD-10-CM | POA: Diagnosis not present

## 2012-04-13 DIAGNOSIS — Z23 Encounter for immunization: Secondary | ICD-10-CM | POA: Diagnosis not present

## 2012-04-16 DIAGNOSIS — J309 Allergic rhinitis, unspecified: Secondary | ICD-10-CM | POA: Diagnosis not present

## 2012-04-16 DIAGNOSIS — Z853 Personal history of malignant neoplasm of breast: Secondary | ICD-10-CM | POA: Diagnosis not present

## 2012-04-23 DIAGNOSIS — J309 Allergic rhinitis, unspecified: Secondary | ICD-10-CM | POA: Diagnosis not present

## 2012-04-29 DIAGNOSIS — J309 Allergic rhinitis, unspecified: Secondary | ICD-10-CM | POA: Diagnosis not present

## 2012-05-02 DIAGNOSIS — J309 Allergic rhinitis, unspecified: Secondary | ICD-10-CM | POA: Diagnosis not present

## 2012-05-22 DIAGNOSIS — J309 Allergic rhinitis, unspecified: Secondary | ICD-10-CM | POA: Diagnosis not present

## 2012-05-28 DIAGNOSIS — J309 Allergic rhinitis, unspecified: Secondary | ICD-10-CM | POA: Diagnosis not present

## 2012-06-04 DIAGNOSIS — J309 Allergic rhinitis, unspecified: Secondary | ICD-10-CM | POA: Diagnosis not present

## 2012-06-06 DIAGNOSIS — J309 Allergic rhinitis, unspecified: Secondary | ICD-10-CM | POA: Diagnosis not present

## 2012-06-10 DIAGNOSIS — J309 Allergic rhinitis, unspecified: Secondary | ICD-10-CM | POA: Diagnosis not present

## 2012-06-20 DIAGNOSIS — J309 Allergic rhinitis, unspecified: Secondary | ICD-10-CM | POA: Diagnosis not present

## 2012-06-23 DIAGNOSIS — M720 Palmar fascial fibromatosis [Dupuytren]: Secondary | ICD-10-CM | POA: Diagnosis not present

## 2012-06-23 DIAGNOSIS — M19039 Primary osteoarthritis, unspecified wrist: Secondary | ICD-10-CM | POA: Diagnosis not present

## 2012-06-23 DIAGNOSIS — G5600 Carpal tunnel syndrome, unspecified upper limb: Secondary | ICD-10-CM | POA: Diagnosis not present

## 2012-07-04 DIAGNOSIS — G5760 Lesion of plantar nerve, unspecified lower limb: Secondary | ICD-10-CM | POA: Diagnosis not present

## 2012-07-07 DIAGNOSIS — G5760 Lesion of plantar nerve, unspecified lower limb: Secondary | ICD-10-CM | POA: Diagnosis not present

## 2012-07-15 DIAGNOSIS — G5620 Lesion of ulnar nerve, unspecified upper limb: Secondary | ICD-10-CM | POA: Diagnosis not present

## 2012-07-15 DIAGNOSIS — G5600 Carpal tunnel syndrome, unspecified upper limb: Secondary | ICD-10-CM | POA: Diagnosis not present

## 2012-07-15 DIAGNOSIS — M720 Palmar fascial fibromatosis [Dupuytren]: Secondary | ICD-10-CM | POA: Diagnosis not present

## 2012-07-15 DIAGNOSIS — J309 Allergic rhinitis, unspecified: Secondary | ICD-10-CM | POA: Diagnosis not present

## 2012-07-15 DIAGNOSIS — M19049 Primary osteoarthritis, unspecified hand: Secondary | ICD-10-CM | POA: Diagnosis not present

## 2012-07-22 DIAGNOSIS — R000 Tachycardia, unspecified: Secondary | ICD-10-CM | POA: Diagnosis not present

## 2012-07-22 DIAGNOSIS — J45909 Unspecified asthma, uncomplicated: Secondary | ICD-10-CM | POA: Diagnosis not present

## 2012-07-22 DIAGNOSIS — Z79899 Other long term (current) drug therapy: Secondary | ICD-10-CM | POA: Diagnosis not present

## 2012-07-22 DIAGNOSIS — C50919 Malignant neoplasm of unspecified site of unspecified female breast: Secondary | ICD-10-CM | POA: Diagnosis not present

## 2012-07-22 DIAGNOSIS — K219 Gastro-esophageal reflux disease without esophagitis: Secondary | ICD-10-CM | POA: Diagnosis not present

## 2012-07-22 DIAGNOSIS — R195 Other fecal abnormalities: Secondary | ICD-10-CM | POA: Diagnosis not present

## 2012-07-22 DIAGNOSIS — J309 Allergic rhinitis, unspecified: Secondary | ICD-10-CM | POA: Diagnosis not present

## 2012-07-23 DIAGNOSIS — G5620 Lesion of ulnar nerve, unspecified upper limb: Secondary | ICD-10-CM | POA: Diagnosis not present

## 2012-08-04 DIAGNOSIS — F909 Attention-deficit hyperactivity disorder, unspecified type: Secondary | ICD-10-CM | POA: Diagnosis not present

## 2012-08-04 DIAGNOSIS — Z0000 Encounter for general adult medical examination without abnormal findings: Secondary | ICD-10-CM | POA: Diagnosis not present

## 2012-08-04 DIAGNOSIS — J45909 Unspecified asthma, uncomplicated: Secondary | ICD-10-CM | POA: Diagnosis not present

## 2012-08-04 DIAGNOSIS — K219 Gastro-esophageal reflux disease without esophagitis: Secondary | ICD-10-CM | POA: Diagnosis not present

## 2012-08-05 DIAGNOSIS — J309 Allergic rhinitis, unspecified: Secondary | ICD-10-CM | POA: Diagnosis not present

## 2012-08-06 DIAGNOSIS — Z1212 Encounter for screening for malignant neoplasm of rectum: Secondary | ICD-10-CM | POA: Diagnosis not present

## 2012-08-15 DIAGNOSIS — J45909 Unspecified asthma, uncomplicated: Secondary | ICD-10-CM | POA: Diagnosis not present

## 2012-08-15 DIAGNOSIS — J309 Allergic rhinitis, unspecified: Secondary | ICD-10-CM | POA: Diagnosis not present

## 2012-08-18 DIAGNOSIS — G5760 Lesion of plantar nerve, unspecified lower limb: Secondary | ICD-10-CM | POA: Diagnosis not present

## 2012-09-02 DIAGNOSIS — J309 Allergic rhinitis, unspecified: Secondary | ICD-10-CM | POA: Diagnosis not present

## 2012-09-02 DIAGNOSIS — G5620 Lesion of ulnar nerve, unspecified upper limb: Secondary | ICD-10-CM | POA: Diagnosis not present

## 2012-09-02 DIAGNOSIS — Z4789 Encounter for other orthopedic aftercare: Secondary | ICD-10-CM | POA: Diagnosis not present

## 2012-09-05 DIAGNOSIS — J309 Allergic rhinitis, unspecified: Secondary | ICD-10-CM | POA: Diagnosis not present

## 2012-09-11 DIAGNOSIS — Z124 Encounter for screening for malignant neoplasm of cervix: Secondary | ICD-10-CM | POA: Diagnosis not present

## 2012-09-11 DIAGNOSIS — J309 Allergic rhinitis, unspecified: Secondary | ICD-10-CM | POA: Diagnosis not present

## 2012-09-11 DIAGNOSIS — Z01419 Encounter for gynecological examination (general) (routine) without abnormal findings: Secondary | ICD-10-CM | POA: Diagnosis not present

## 2012-09-11 DIAGNOSIS — N951 Menopausal and female climacteric states: Secondary | ICD-10-CM | POA: Diagnosis not present

## 2012-09-22 DIAGNOSIS — M76899 Other specified enthesopathies of unspecified lower limb, excluding foot: Secondary | ICD-10-CM | POA: Diagnosis not present

## 2012-09-22 DIAGNOSIS — M1710 Unilateral primary osteoarthritis, unspecified knee: Secondary | ICD-10-CM | POA: Diagnosis not present

## 2012-09-23 DIAGNOSIS — J309 Allergic rhinitis, unspecified: Secondary | ICD-10-CM | POA: Diagnosis not present

## 2012-10-02 DIAGNOSIS — M25559 Pain in unspecified hip: Secondary | ICD-10-CM | POA: Diagnosis not present

## 2012-10-20 DIAGNOSIS — J309 Allergic rhinitis, unspecified: Secondary | ICD-10-CM | POA: Diagnosis not present

## 2012-10-20 DIAGNOSIS — G5620 Lesion of ulnar nerve, unspecified upper limb: Secondary | ICD-10-CM | POA: Diagnosis not present

## 2012-10-20 DIAGNOSIS — M79609 Pain in unspecified limb: Secondary | ICD-10-CM | POA: Diagnosis not present

## 2012-10-23 DIAGNOSIS — J309 Allergic rhinitis, unspecified: Secondary | ICD-10-CM | POA: Diagnosis not present

## 2012-10-23 DIAGNOSIS — M25559 Pain in unspecified hip: Secondary | ICD-10-CM | POA: Diagnosis not present

## 2012-10-23 DIAGNOSIS — M1710 Unilateral primary osteoarthritis, unspecified knee: Secondary | ICD-10-CM | POA: Diagnosis not present

## 2012-10-29 DIAGNOSIS — J309 Allergic rhinitis, unspecified: Secondary | ICD-10-CM | POA: Diagnosis not present

## 2012-11-04 DIAGNOSIS — J309 Allergic rhinitis, unspecified: Secondary | ICD-10-CM | POA: Diagnosis not present

## 2012-11-07 DIAGNOSIS — J309 Allergic rhinitis, unspecified: Secondary | ICD-10-CM | POA: Diagnosis not present

## 2012-11-10 DIAGNOSIS — M1610 Unilateral primary osteoarthritis, unspecified hip: Secondary | ICD-10-CM | POA: Diagnosis not present

## 2012-11-10 DIAGNOSIS — J309 Allergic rhinitis, unspecified: Secondary | ICD-10-CM | POA: Diagnosis not present

## 2012-11-13 DIAGNOSIS — M25519 Pain in unspecified shoulder: Secondary | ICD-10-CM | POA: Diagnosis not present

## 2012-11-13 DIAGNOSIS — M719 Bursopathy, unspecified: Secondary | ICD-10-CM | POA: Diagnosis not present

## 2012-11-13 DIAGNOSIS — M67919 Unspecified disorder of synovium and tendon, unspecified shoulder: Secondary | ICD-10-CM | POA: Diagnosis not present

## 2012-11-13 DIAGNOSIS — M76899 Other specified enthesopathies of unspecified lower limb, excluding foot: Secondary | ICD-10-CM | POA: Diagnosis not present

## 2012-11-13 DIAGNOSIS — M25559 Pain in unspecified hip: Secondary | ICD-10-CM | POA: Diagnosis not present

## 2012-11-17 DIAGNOSIS — G5620 Lesion of ulnar nerve, unspecified upper limb: Secondary | ICD-10-CM | POA: Diagnosis not present

## 2012-11-17 DIAGNOSIS — J309 Allergic rhinitis, unspecified: Secondary | ICD-10-CM | POA: Diagnosis not present

## 2012-11-17 DIAGNOSIS — M79609 Pain in unspecified limb: Secondary | ICD-10-CM | POA: Diagnosis not present

## 2012-11-20 DIAGNOSIS — H02129 Mechanical ectropion of unspecified eye, unspecified eyelid: Secondary | ICD-10-CM | POA: Diagnosis not present

## 2012-11-20 DIAGNOSIS — H0019 Chalazion unspecified eye, unspecified eyelid: Secondary | ICD-10-CM | POA: Diagnosis not present

## 2012-11-20 DIAGNOSIS — H02839 Dermatochalasis of unspecified eye, unspecified eyelid: Secondary | ICD-10-CM | POA: Diagnosis not present

## 2012-11-20 DIAGNOSIS — H11439 Conjunctival hyperemia, unspecified eye: Secondary | ICD-10-CM | POA: Diagnosis not present

## 2012-11-21 DIAGNOSIS — L570 Actinic keratosis: Secondary | ICD-10-CM | POA: Diagnosis not present

## 2012-11-27 DIAGNOSIS — J309 Allergic rhinitis, unspecified: Secondary | ICD-10-CM | POA: Diagnosis not present

## 2012-12-03 DIAGNOSIS — M545 Low back pain, unspecified: Secondary | ICD-10-CM | POA: Diagnosis not present

## 2012-12-03 DIAGNOSIS — M25569 Pain in unspecified knee: Secondary | ICD-10-CM | POA: Diagnosis not present

## 2012-12-08 DIAGNOSIS — M545 Low back pain, unspecified: Secondary | ICD-10-CM | POA: Diagnosis not present

## 2012-12-08 DIAGNOSIS — M25569 Pain in unspecified knee: Secondary | ICD-10-CM | POA: Diagnosis not present

## 2012-12-10 DIAGNOSIS — M545 Low back pain, unspecified: Secondary | ICD-10-CM | POA: Diagnosis not present

## 2012-12-10 DIAGNOSIS — M25569 Pain in unspecified knee: Secondary | ICD-10-CM | POA: Diagnosis not present

## 2012-12-10 DIAGNOSIS — J309 Allergic rhinitis, unspecified: Secondary | ICD-10-CM | POA: Diagnosis not present

## 2012-12-11 DIAGNOSIS — H2510 Age-related nuclear cataract, unspecified eye: Secondary | ICD-10-CM | POA: Diagnosis not present

## 2012-12-15 DIAGNOSIS — M545 Low back pain, unspecified: Secondary | ICD-10-CM | POA: Diagnosis not present

## 2012-12-15 DIAGNOSIS — M25569 Pain in unspecified knee: Secondary | ICD-10-CM | POA: Diagnosis not present

## 2012-12-15 DIAGNOSIS — J309 Allergic rhinitis, unspecified: Secondary | ICD-10-CM | POA: Diagnosis not present

## 2012-12-17 DIAGNOSIS — M25559 Pain in unspecified hip: Secondary | ICD-10-CM | POA: Diagnosis not present

## 2012-12-17 DIAGNOSIS — M67919 Unspecified disorder of synovium and tendon, unspecified shoulder: Secondary | ICD-10-CM | POA: Diagnosis not present

## 2012-12-17 DIAGNOSIS — M25519 Pain in unspecified shoulder: Secondary | ICD-10-CM | POA: Diagnosis not present

## 2012-12-17 DIAGNOSIS — M545 Low back pain, unspecified: Secondary | ICD-10-CM | POA: Diagnosis not present

## 2012-12-17 DIAGNOSIS — M76899 Other specified enthesopathies of unspecified lower limb, excluding foot: Secondary | ICD-10-CM | POA: Diagnosis not present

## 2012-12-17 DIAGNOSIS — M719 Bursopathy, unspecified: Secondary | ICD-10-CM | POA: Diagnosis not present

## 2012-12-22 DIAGNOSIS — M545 Low back pain, unspecified: Secondary | ICD-10-CM | POA: Diagnosis not present

## 2012-12-24 DIAGNOSIS — M25569 Pain in unspecified knee: Secondary | ICD-10-CM | POA: Diagnosis not present

## 2012-12-24 DIAGNOSIS — M545 Low back pain, unspecified: Secondary | ICD-10-CM | POA: Diagnosis not present

## 2012-12-24 DIAGNOSIS — J309 Allergic rhinitis, unspecified: Secondary | ICD-10-CM | POA: Diagnosis not present

## 2012-12-26 ENCOUNTER — Inpatient Hospital Stay (HOSPITAL_COMMUNITY)
Admission: EM | Admit: 2012-12-26 | Discharge: 2013-01-03 | DRG: 242 | Disposition: A | Discharge status: Home / Self Care | Payer: Medicare Other | Attending: Internal Medicine | Admitting: Internal Medicine

## 2012-12-26 ENCOUNTER — Encounter (HOSPITAL_COMMUNITY): Payer: Self-pay | Admitting: Emergency Medicine

## 2012-12-26 ENCOUNTER — Other Ambulatory Visit: Payer: Self-pay

## 2012-12-26 ENCOUNTER — Emergency Department (HOSPITAL_COMMUNITY): Payer: Medicare Other

## 2012-12-26 DIAGNOSIS — F329 Major depressive disorder, single episode, unspecified: Secondary | ICD-10-CM | POA: Diagnosis present

## 2012-12-26 DIAGNOSIS — T82190A Other mechanical complication of cardiac electrode, initial encounter: Secondary | ICD-10-CM | POA: Diagnosis not present

## 2012-12-26 DIAGNOSIS — I441 Atrioventricular block, second degree: Principal | ICD-10-CM

## 2012-12-26 DIAGNOSIS — I5031 Acute diastolic (congestive) heart failure: Secondary | ICD-10-CM | POA: Diagnosis present

## 2012-12-26 DIAGNOSIS — E876 Hypokalemia: Secondary | ICD-10-CM

## 2012-12-26 DIAGNOSIS — K219 Gastro-esophageal reflux disease without esophagitis: Secondary | ICD-10-CM | POA: Diagnosis present

## 2012-12-26 DIAGNOSIS — E039 Hypothyroidism, unspecified: Secondary | ICD-10-CM | POA: Diagnosis not present

## 2012-12-26 DIAGNOSIS — N3010 Interstitial cystitis (chronic) without hematuria: Secondary | ICD-10-CM | POA: Diagnosis present

## 2012-12-26 DIAGNOSIS — J40 Bronchitis, not specified as acute or chronic: Secondary | ICD-10-CM | POA: Diagnosis not present

## 2012-12-26 DIAGNOSIS — Z853 Personal history of malignant neoplasm of breast: Secondary | ICD-10-CM | POA: Diagnosis not present

## 2012-12-26 DIAGNOSIS — I309 Acute pericarditis, unspecified: Secondary | ICD-10-CM

## 2012-12-26 DIAGNOSIS — R001 Bradycardia, unspecified: Secondary | ICD-10-CM

## 2012-12-26 DIAGNOSIS — IMO0002 Reserved for concepts with insufficient information to code with codable children: Secondary | ICD-10-CM | POA: Diagnosis not present

## 2012-12-26 DIAGNOSIS — J438 Other emphysema: Secondary | ICD-10-CM | POA: Diagnosis not present

## 2012-12-26 DIAGNOSIS — M129 Arthropathy, unspecified: Secondary | ICD-10-CM | POA: Diagnosis present

## 2012-12-26 DIAGNOSIS — R0602 Shortness of breath: Secondary | ICD-10-CM

## 2012-12-26 DIAGNOSIS — R079 Chest pain, unspecified: Secondary | ICD-10-CM | POA: Diagnosis not present

## 2012-12-26 DIAGNOSIS — E871 Hypo-osmolality and hyponatremia: Secondary | ICD-10-CM

## 2012-12-26 DIAGNOSIS — R0609 Other forms of dyspnea: Secondary | ICD-10-CM | POA: Diagnosis not present

## 2012-12-26 DIAGNOSIS — Z95818 Presence of other cardiac implants and grafts: Secondary | ICD-10-CM | POA: Diagnosis not present

## 2012-12-26 DIAGNOSIS — R55 Syncope and collapse: Secondary | ICD-10-CM

## 2012-12-26 DIAGNOSIS — Z950 Presence of cardiac pacemaker: Secondary | ICD-10-CM | POA: Diagnosis not present

## 2012-12-26 DIAGNOSIS — I319 Disease of pericardium, unspecified: Secondary | ICD-10-CM | POA: Diagnosis not present

## 2012-12-26 DIAGNOSIS — K222 Esophageal obstruction: Secondary | ICD-10-CM | POA: Diagnosis present

## 2012-12-26 DIAGNOSIS — F909 Attention-deficit hyperactivity disorder, unspecified type: Secondary | ICD-10-CM | POA: Diagnosis present

## 2012-12-26 DIAGNOSIS — I509 Heart failure, unspecified: Secondary | ICD-10-CM | POA: Diagnosis present

## 2012-12-26 DIAGNOSIS — F3289 Other specified depressive episodes: Secondary | ICD-10-CM | POA: Diagnosis present

## 2012-12-26 DIAGNOSIS — I452 Bifascicular block: Secondary | ICD-10-CM | POA: Diagnosis present

## 2012-12-26 DIAGNOSIS — Z96649 Presence of unspecified artificial hip joint: Secondary | ICD-10-CM

## 2012-12-26 DIAGNOSIS — J45909 Unspecified asthma, uncomplicated: Secondary | ICD-10-CM | POA: Diagnosis present

## 2012-12-26 DIAGNOSIS — R0989 Other specified symptoms and signs involving the circulatory and respiratory systems: Secondary | ICD-10-CM | POA: Diagnosis not present

## 2012-12-26 DIAGNOSIS — R000 Tachycardia, unspecified: Secondary | ICD-10-CM

## 2012-12-26 HISTORY — DX: Chest pain, unspecified: R07.9

## 2012-12-26 HISTORY — DX: Other pericardial effusion (noninflammatory): I31.39

## 2012-12-26 HISTORY — DX: Bradycardia, unspecified: R00.1

## 2012-12-26 HISTORY — DX: Pericardial effusion (noninflammatory): I31.3

## 2012-12-26 LAB — COMPREHENSIVE METABOLIC PANEL
Albumin: 4.1 g/dL (ref 3.5–5.2)
Alkaline Phosphatase: 58 U/L (ref 39–117)
BUN: 14 mg/dL (ref 6–23)
Chloride: 93 mEq/L — ABNORMAL LOW (ref 96–112)
Creatinine, Ser: 0.7 mg/dL (ref 0.50–1.10)
GFR calc Af Amer: >90 mL/min (ref >90)
GFR calc non Af Amer: 87 mL/min — ABNORMAL LOW (ref >90)
Glucose, Bld: 88 mg/dL (ref 70–99)
Potassium: 3.1 mEq/L — ABNORMAL LOW (ref 3.5–5.1)
Total Bilirubin: 0.3 mg/dL (ref 0.3–1.2)

## 2012-12-26 LAB — CBC WITH DIFFERENTIAL/PLATELET
Eosinophils Absolute: 0.1 10*3/uL (ref 0.0–0.7)
Eosinophils Relative: 1 % (ref 0–5)
HCT: 40.3 % (ref 36.0–46.0)
Lymphocytes Relative: 39 % (ref 12–46)
Lymphs Abs: 2.3 10*3/uL (ref 0.7–4.0)
MCH: 32 pg (ref 26.0–34.0)
MCV: 89.6 fL (ref 78.0–100.0)
Monocytes Absolute: 0.5 10*3/uL (ref 0.1–1.0)
Platelets: 239 10*3/uL (ref 150–400)
RBC: 4.5 MIL/uL (ref 3.87–5.11)
RDW: 12.6 % (ref 11.5–15.5)

## 2012-12-26 LAB — TROPONIN I: Troponin I: <0.3 ng/mL (ref <0.30)

## 2012-12-26 LAB — MAGNESIUM: Magnesium: 2 mg/dL (ref 1.5–2.5)

## 2012-12-26 MED ORDER — POTASSIUM CHLORIDE CRYS ER 20 MEQ PO TBCR
40.0000 meq | EXTENDED_RELEASE_TABLET | Freq: Once | ORAL | Status: AC
  Administered 2012-12-27: 40 meq via ORAL
  Filled 2012-12-26: qty 2

## 2012-12-26 MED ORDER — SODIUM CHLORIDE 0.9 % IV SOLN
INTRAVENOUS | Status: DC
  Administered 2012-12-26: 21:00:00 via INTRAVENOUS

## 2012-12-26 MED ORDER — POTASSIUM CHLORIDE CRYS ER 20 MEQ PO TBCR
40.0000 meq | EXTENDED_RELEASE_TABLET | ORAL | Status: AC
  Administered 2012-12-27: 40 meq via ORAL
  Filled 2012-12-26: qty 2

## 2012-12-26 NOTE — ED Provider Notes (Signed)
CSN: [id number]     Arrival date & time 12/26/12  1950 History     First MD Initiated Contact with Patient 12/26/12 2023     Chief Complaint  Patient presents with  . Shortness of Breath  . Near Syncope   (Consider location/radiation/quality/duration/timing/severity/associated sxs/prior Treatment) Patient is a 67 y.o. female presenting with shortness of breath. The history is provided by the patient.  Shortness of Breath Associated symptoms: no abdominal pain, no chest pain, no cough, no fever, no headaches, no neck pain, no rash and no vomiting   pt c/o feeling generally weak, mild doe, occasionally lightheaded w exertion/standing, all in past 1 week. Symptoms moderate.  Symptoms constant, but worse w exertion. Denies prior hx same symptoms. No current or recent cp or discomfort. No orthopnea or pnd. No leg pain or swelling. Denies hx chf or any cardiac disease or dysrhythmia. No hx dvt or pe. No recent immobility, trauma or surgery. No recent change in meds or new meds, compliant w meds. No fever or chills. No cough or uri c/o.      Past Medical History  Diagnosis Date  . Diverticulosis   . GERD (gastroesophageal reflux disease)   . Esophageal stricture   . Hemorrhoids   . Mallory - Weiss tear   . Interstitial cystitis   . ADHD (attention deficit hyperactivity disorder)   . Breast cancer     right side  . Arthritis   . Blood transfusion   . Thyroid disease   . Allergy     trees/pollen, mold, fungus, dust mites. Takes allergy shots  . Latex allergy, contact dermatitis   . Cataract   . Depression   . Hernia of abdominal wall     spigelian hernia RLQ  . H/O hiatal hernia   . Neuromuscular disorder     central nervous system neuropathy- seen per Dr Sandria Manly  . Asthma     allergist Dr Illene Labrador- monthly allergy injections  . Complication of anesthesia     reaction to some anesthetics/ 7/12 anesth record on chart- states prefers epidural  . PONV (postoperative nausea and vomiting)      pt needs scop patch  . Recurrent upper respiratory infection (URI) 1/13- to present    bronchitis following surgery- states improved but still with cough. OV with Clearance Dr [name] 09/06/11 on chart   Past Surgical History  Procedure Laterality Date  . Mastectomy modified radical      right; with immediate reconstruction  . Laparoscopy  1973  . Abdominal hysterectomy  1974  . Cystoscopy  1975, 2007  . Myringoplasty  1962  . Tympanoplasty  1973    right  . Oophorectomy  1982  . Bladder suspension    . Rectocele repair      with cystocele repair  . Total hip arthroplasty    . Ventral hernia repair  05/30/2011    Procedure: HERNIA REPAIR VENTRAL ADULT;  Surgeon: Currie Paris, MD;  Location: [facility];  Service: General;  Laterality: Right;  repair right spigelian hernia  . Breast biopsy  2002    NO BLOOD PRESSURES ON RIGHT SIDE/   s/p  axillary node dissection  . Tonsillectomy    . Back surgery      cervical fusion 4-5 with plate  . Brain surgery      bilateral lasik  . Cysto with hydrodistension  09/07/2011    Procedure: CYSTOSCOPY/HYDRODISTENSION;  Surgeon: Kathi Ludwig, MD;  Location: [facility];  Service: Urology;  Laterality: N/A;  INSTILLATION OF MARCAINE/PYRIDIUM INSTILLATION OF MARCAINE/KENALOG   Family History  Problem Relation Age of Onset  . Heart disease Father   . Diabetes      aunt  . Uterine cancer      grandmother  . Colon cancer Mother   . Depression Mother   . Cancer Mother     colon  . Pancreatic cancer Sister   . Cancer Sister     pancreatic  . Cancer Maternal Grandmother     ovarian   History  Substance Use Topics  . Smoking status: Never Smoker   . Smokeless tobacco: Never Used  . Alcohol Use: 4.2 oz/week    7 Glasses of wine per week     Comment: nightly   OB History   Grav Para Term Preterm Abortions TAB SAB Ect Mult Living                 Review of Systems  Constitutional: Negative for fever and  chills.  HENT: Negative for neck pain.   Eyes: Negative for visual disturbance.  Respiratory: Positive for shortness of breath. Negative for cough.   Cardiovascular: Negative for chest pain, palpitations and leg swelling.  Gastrointestinal: Negative for vomiting, abdominal pain, diarrhea and blood in stool.  Genitourinary: Negative for flank pain.  Musculoskeletal: Negative for back pain.  Skin: Negative for rash.  Neurological: Negative for headaches.  Hematological: Does not bruise/bleed easily.  Psychiatric/Behavioral: Negative for confusion.    Allergies  Anesthetics, amide; Bactroban; Ciprofloxacin; Codeine; Meperidine hcl; Morphine; Neosporin; Nitrofurantoin; Penicillins; Sulfonamide derivatives; and Latex  Home Medications   Current Outpatient Rx  Name  Route  Sig  Dispense  Refill  . albuterol (PROVENTIL HFA;VENTOLIN HFA) 108 (90 BASE) MCG/ACT inhaler   Inhalation   Inhale 2 puffs into the lungs every 6 (six) hours as needed. For shortness of breath         . amphetamine-dextroamphetamine (ADDERALL) 10 MG tablet   Oral   Take 10 mg by mouth daily.          . Ascorbic Acid (VITAMIN C) 100 MG tablet   Oral   Take 100 mg by mouth daily.          Marland Kitchen b complex vitamins capsule   Oral   Take 1 capsule by mouth daily.          Marland Kitchen buPROPion (WELLBUTRIN SR) 150 MG 12 hr tablet   Oral   Take 450 mg by mouth daily.          . calcium carbonate (TUMS - DOSED IN MG ELEMENTAL CALCIUM) 500 MG chewable tablet   Oral   Chew 1 tablet by mouth as needed.          . Calcium Carbonate-Vitamin D (CALTRATE 600+D) 600-400 MG-UNIT per chew tablet   Oral   Chew 1 tablet by mouth daily.          . cholecalciferol (VITAMIN D) 1000 UNITS tablet   Oral   Take 1,000 Units by mouth daily.         . citalopram (CELEXA) 40 MG tablet   Oral   Take 40 mg by mouth daily.          . clonazePAM (KLONOPIN) 1 MG tablet   Oral   Take 1 mg by mouth 3 (three) times daily as  needed.          . docusate sodium (COLACE) 100 MG capsule  Oral   Take 200 mg by mouth every evening.          Marland Kitchen esomeprazole (NEXIUM) 40 MG capsule   Oral   Take 40 mg by mouth daily before breakfast.          . estradiol (ESTRING) 2 MG vaginal ring   Vaginal   Place 2 mg vaginally every 3 (three) months. follow package directions         . estradiol (VIVELLE-DOT) 0.025 MG/24HR   Transdermal   Place 1 patch onto the skin 2 (two) times a week.          . fexofenadine (ALLEGRA) 180 MG tablet   Oral   Take 180 mg by mouth daily.          . fish oil-omega-3 fatty acids 1000 MG capsule   Oral   Take 2 g by mouth daily.         Marland Kitchen GRAPE SEED CR PO   Oral   Take 1 tablet by mouth daily.          . Homeopathic Products (ZICAM ALLERGY RELIEF NA)   Nasal   Place 1 spray into the nose 2 (two) times daily as needed. For allergy relief         . levothyroxine (SYNTHROID, LEVOTHROID) 112 MCG tablet   Oral   Take 112 mcg by mouth every morning.          . magnesium 30 MG tablet   Oral   Take 30 mg by mouth 2 (two) times daily.          . mometasone (ASMANEX) 220 MCG/INH inhaler   Inhalation   Inhale 2 puffs into the lungs daily.         . mometasone (NASONEX) 50 MCG/ACT nasal spray   Nasal   Place 2 sprays into the nose daily.         . montelukast (SINGULAIR) 10 MG tablet   Oral   Take 10 mg by mouth daily.         Gracelyn Nurse Leaf 250 MG CAPS   Oral   Take 1 capsule by mouth.         . pentosan polysulfate (ELMIRON) 100 MG capsule   Oral   Take 100 mg by mouth 3 (three) times daily.         . pirbuterol (MAXAIR) 200 MCG/INH inhaler   Inhalation   Inhale 2 puffs into the lungs 4 (four) times daily as needed.          . polyethylene glycol (MIRALAX / GLYCOLAX) packet   Oral   Take 17 g by mouth as needed.          . Potassium 75 MG TABS   Oral   Take by mouth.          . ranitidine (ZANTAC) 300 MG tablet   Oral   Take  300 mg by mouth at bedtime.         . triamterene-hydrochlorothiazide (MAXZIDE) 75-50 MG per tablet   Oral   Take 1 tablet by mouth every morning.          . vitamin B-12 (CYANOCOBALAMIN) 100 MCG tablet   Oral   Take 50 mcg by mouth daily.           BP 138/56  Pulse 38  Temp(Src) 97.8 F (36.6 C) (Oral)  Resp 18  SpO2 100% Physical Exam  Nursing note and vitals reviewed. Constitutional:  She is oriented to person, place, and time. She appears well-developed and well-nourished.  Markedly bradycardic  HENT:  Mouth/Throat: Oropharynx is clear and moist.  Eyes: Conjunctivae are normal. No scleral icterus.  Neck: Neck supple. No tracheal deviation present. No thyromegaly present.  Cardiovascular: Regular rhythm, normal heart sounds and intact distal pulses.  Exam reveals no gallop and no friction rub.   No murmur heard. Pulmonary/Chest: Effort normal and breath sounds normal. No respiratory distress.  Abdominal: Soft. Normal appearance. She exhibits no distension. There is no tenderness.  Genitourinary:  No cva tenderness  Musculoskeletal: She exhibits no edema and no tenderness.  Neurological: She is alert and oriented to person, place, and time.  Skin: Skin is warm and dry. No rash noted. She is not diaphoretic.  Psychiatric: She has a normal mood and affect.    ED Course   Procedures (including critical care time)  Results for orders placed during the hospital encounter of 12/26/12  MRSA PCR SCREENING      Result Value Range   MRSA by PCR NEGATIVE  NEGATIVE  CBC WITH DIFFERENTIAL      Result Value Range   WBC 5.9  4.0 - 10.5 K/uL   RBC 4.50  3.87 - 5.11 MIL/uL   Hemoglobin 14.4  12.0 - 15.0 g/dL   HCT 16.1  09.6 - 04.5 %   MCV 89.6  78.0 - 100.0 fL   MCH 32.0  26.0 - 34.0 pg   MCHC 35.7  30.0 - 36.0 g/dL   RDW 40.9  81.1 - 91.4 %   Platelets 239  150 - 400 K/uL   Neutrophils Relative % 51  43 - 77 %   Neutro Abs 3.0  1.7 - 7.7 K/uL   Lymphocytes Relative  39  12 - 46 %   Lymphs Abs 2.3  0.7 - 4.0 K/uL   Monocytes Relative 9  3 - 12 %   Monocytes Absolute 0.5  0.1 - 1.0 K/uL   Eosinophils Relative 1  0 - 5 %   Eosinophils Absolute 0.1  0.0 - 0.7 K/uL   Basophils Relative 0  0 - 1 %   Basophils Absolute 0.0  0.0 - 0.1 K/uL  TROPONIN I      Result Value Range   Troponin I <0.30  <0.30 ng/mL  PRO B NATRIURETIC PEPTIDE      Result Value Range   Pro B Natriuretic peptide (BNP) 763.0 (*) 0 - 125 pg/mL  COMPREHENSIVE METABOLIC PANEL      Result Value Range   Sodium 130 (*) 135 - 145 mEq/L   Potassium 3.1 (*) 3.5 - 5.1 mEq/L   Chloride 93 (*) 96 - 112 mEq/L   CO2 24  19 - 32 mEq/L   Glucose, Bld 88  70 - 99 mg/dL   BUN 14  6 - 23 mg/dL   Creatinine, Ser 7.82  0.50 - 1.10 mg/dL   Calcium 9.6  8.4 - 95.6 mg/dL   Total Protein 6.9  6.0 - 8.3 g/dL   Albumin 4.1  3.5 - 5.2 g/dL   AST 23  0 - 37 U/L   ALT 24  0 - 35 U/L   Alkaline Phosphatase 58  39 - 117 U/L   Total Bilirubin 0.3  0.3 - 1.2 mg/dL   GFR calc non Af Amer 87 (*) >90 mL/min   GFR calc Af Amer >90  >90 mL/min  MAGNESIUM      Result Value Range  Magnesium 2.0  1.5 - 2.5 mg/dL  TSH      Result Value Range   TSH 7.028 (*) 0.350 - 4.500 uIU/mL  GLUCOSE, CAPILLARY      Result Value Range   Glucose-Capillary 80  70 - 99 mg/dL  CBC      Result Value Range   WBC 5.4  4.0 - 10.5 K/uL   RBC 4.12  3.87 - 5.11 MIL/uL   Hemoglobin 13.4  12.0 - 15.0 g/dL   HCT 04.5  40.9 - 81.1 %   MCV 88.6  78.0 - 100.0 fL   MCH 32.5  26.0 - 34.0 pg   MCHC 36.7 (*) 30.0 - 36.0 g/dL   RDW 91.4  78.2 - 95.6 %   Platelets 190  150 - 400 K/uL  TROPONIN I      Result Value Range   Troponin I <0.30  <0.30 ng/mL  TROPONIN I      Result Value Range   Troponin I <0.30  <0.30 ng/mL  APTT      Result Value Range   aPTT 28  24 - 37 seconds  PROTIME-INR      Result Value Range   Prothrombin Time 13.0  11.6 - 15.2 seconds   INR 1.00  0.00 - 1.49  COMPREHENSIVE METABOLIC PANEL      Result Value  Range   Sodium 135  135 - 145 mEq/L   Potassium 3.9  3.5 - 5.1 mEq/L   Chloride 104  96 - 112 mEq/L   CO2 22  19 - 32 mEq/L   Glucose, Bld 90  70 - 99 mg/dL   BUN 8  6 - 23 mg/dL   Creatinine, Ser 2.13  0.50 - 1.10 mg/dL   Calcium 9.2  8.4 - 08.6 mg/dL   Total Protein 5.6 (*) 6.0 - 8.3 g/dL   Albumin 3.5  3.5 - 5.2 g/dL   AST 19  0 - 37 U/L   ALT 20  0 - 35 U/L   Alkaline Phosphatase 41  39 - 117 U/L   Total Bilirubin 0.5  0.3 - 1.2 mg/dL   GFR calc non Af Amer >90  >90 mL/min   GFR calc Af Amer >90  >90 mL/min  MAGNESIUM      Result Value Range   Magnesium 2.2  1.5 - 2.5 mg/dL   Dg Chest 2 View  5/78/4696   *RADIOLOGY REPORT*  Clinical Data: 67 year old female with pain.  CHEST - 2 VIEW  Comparison: 08/31/2011 and earlier.  Findings: Upright AP and lateral views of the chest.  Postoperative changes to the right chest wall and axilla re-identified.  Slightly lower lung volumes.  Stable and negative cardiac mediastinal contours.  No pneumothorax, pulmonary edema, pleural effusion or confluent pulmonary opacity. No acute osseous abnormality identified.  Cervical ACDF hardware partially visible.  IMPRESSION: No acute cardiopulmonary abnormality.   Original Report Authenticated By: Erskine Speed, M.D.      MDM  Iv ns, monitor. Labs. Cxr.  Reviewed nursing notes and prior charts for additional history.    Date: 12/26/2012  Rate: 40  Rhythm: sr w 2nd degree heart block  QRS Axis: left  Intervals: 2nd deg heart block  ST/T Wave abnormalities: nonspecific ST/T changes  Conduction Disutrbances:second-degree A-V block, ( Mobitz II )  Narrative Interpretation:   Old EKG Reviewed: changes noted    k low, kcl given.  Discussed pt with cardiology on call (pt states saw a cardiologist one  time many yrs ago but cant recall name) - Dr Mayford Knife will see in ED and admit.  Recheck, pt remains conscious and alert, feels improved on o2 Luis Lopez, bp normal.  Dr Mayford Knife requests transfer to CCU at  [facility], arrangements made, pt appears stable on recheck prior to transfer.     Suzi Roots, MD 12/27/12 [phone]

## 2012-12-26 NOTE — ED Notes (Signed)
Pt has had progressively worsening SOB past week and a half. Today around 1430 pt was walking up a few steps, became extremely SOB, and started to black out. Pt has had 6 episodes total since 1430. Pt now has pain behind both eyes and temples.

## 2012-12-26 NOTE — H&P (Addendum)
Admit date: 12/26/2012 Referring Physician Dr. Denton Lank Primary Cardiologist NONE Chief complaint/reason for admission: second degree heart block  HPI: This is a 67yo WF who presented to the ER with progressive SOB over the past few weeks.  Today around 2:30pm while she was walking up a few steps she became extremely SOB and got very dizzy and diaphoretic and had a near syncopal episode.  Since then she has had a total of 6 more episodes and the last one was the worst.  She did not lose consciousness but lost her vision.  She could not catch her breath during the episode.  She came to the ER and was found to be in second degree HB with HR in the 30's.  She denied any chest pain, LE edema or palpitations.    PMH:    Past Medical History  Diagnosis Date  . Diverticulosis   . GERD (gastroesophageal reflux disease)   . Esophageal stricture   . Hemorrhoids   . Mallory - Weiss tear   . Interstitial cystitis   . ADHD (attention deficit hyperactivity disorder)   . Breast cancer     right side  . Arthritis   . Blood transfusion   . Thyroid disease   . Allergy     trees/pollen, mold, fungus, dust mites. Takes allergy shots  . Latex allergy, contact dermatitis   . Cataract   . Depression   . Hernia of abdominal wall     spigelian hernia RLQ  . H/O hiatal hernia   . Neuromuscular disorder     central nervous system neuropathy- seen per Dr Sandria Manly  . Asthma     allergist Dr Illene Labrador- monthly allergy injections  . Complication of anesthesia     reaction to some anesthetics/ 7/12 anesth record on chart- states prefers epidural  . PONV (postoperative nausea and vomiting)     pt needs scop patch  . Recurrent upper respiratory infection (URI) 1/13- to present    bronchitis following surgery- states improved but still with cough. OV with Clearance Dr [name] 09/06/11 on chart    PSH:    Past Surgical History  Procedure Laterality Date  . Mastectomy modified radical      right; with immediate  reconstruction  . Laparoscopy  1973  . Abdominal hysterectomy  1974  . Cystoscopy  1975, 2007  . Myringoplasty  1962  . Tympanoplasty  1973    right  . Oophorectomy  1982  . Bladder suspension    . Rectocele repair      with cystocele repair  . Total hip arthroplasty    . Ventral hernia repair  05/30/2011    Procedure: HERNIA REPAIR VENTRAL ADULT;  Surgeon: Currie Paris, MD;  Location: [facility];  Service: General;  Laterality: Right;  repair right spigelian hernia  . Breast biopsy  2002    NO BLOOD PRESSURES ON RIGHT SIDE/   s/p  axillary node dissection  . Tonsillectomy    . Back surgery      cervical fusion 4-5 with plate  . Brain surgery      bilateral lasik  . Cysto with hydrodistension  09/07/2011    Procedure: CYSTOSCOPY/HYDRODISTENSION;  Surgeon: Kathi Ludwig, MD;  Location: [facility];  Service: Urology;  Laterality: N/A;  INSTILLATION OF MARCAINE/PYRIDIUM INSTILLATION OF MARCAINE/KENALOG    ALLERGIES:   Anesthetics, amide; Bactroban; Ciprofloxacin; Codeine; Meperidine hcl; Morphine; Neosporin; Nitrofurantoin; Penicillins; Sulfonamide derivatives; and Latex  Prior to Admit Meds:   (Not  in a hospital admission) Family HX:    Family History  Problem Relation Age of Onset  . Heart disease Father   . Diabetes      aunt  . Uterine cancer      grandmother  . Colon cancer Mother   . Depression Mother   . Cancer Mother     colon  . Pancreatic cancer Sister   . Cancer Sister     pancreatic  . Cancer Maternal Grandmother     ovarian   Social HX:    History   Social History  . Marital Status: Married    Spouse Name: N/A    Number of Children: 2  . Years of Education: N/A   Occupational History  . vp [employer]   .     Social History Main Topics  . Smoking status: Never Smoker   . Smokeless tobacco: Never Used  . Alcohol Use: 4.2 oz/week    7 Glasses of wine per week     Comment: nightly  . Drug Use: No  .  Sexual Activity: Not on file   Other Topics Concern  . Not on file   Social History Narrative  . No narrative on file     ROS:  All 11 ROS were addressed and are negative except what is stated in the HPI  PHYSICAL EXAM Filed Vitals:   12/26/12 2030  BP:   Pulse: 40  Temp:   Resp: 14   General: Well developed, well nourished, in no acute distress Head: Eyes PERRLA, No xanthomas.   Normal cephalic and atramatic  Lungs:   Clear bilaterally to auscultation and percussion. Heart:   HRRR S1 S2 Pulses are 2+ & equal. bradycardic            No carotid bruit. No JVD.  No abdominal bruits. No femoral bruits. Abdomen: Bowel sounds are positive, abdomen soft and non-tender without masses Extremities:   No clubbing, cyanosis or edema.  DP +1 Neuro: Alert and oriented X 3. Psych:  Good affect, responds appropriately   Labs:   Lab Results  Component Value Date   WBC 5.9 12/26/2012   HGB 14.4 12/26/2012   HCT 40.3 12/26/2012   MCV 89.6 12/26/2012   PLT 239 12/26/2012    Recent Labs Lab 12/26/12 2025  NA 130*  K 3.1*  CL 93*  CO2 24  BUN 14  CREATININE 0.70  CALCIUM 9.6  PROT 6.9  BILITOT 0.3  ALKPHOS 58  ALT 24  AST 23  GLUCOSE 88   Lab Results  Component Value Date   TROPONINI <0.30 12/26/2012   No results found for this basename: PTT   Lab Results  Component Value Date   INR 0.96 11/13/2010         Radiology:  *RADIOLOGY REPORT*  Clinical Data: 67 year old female with pain.  CHEST - 2 VIEW  Comparison: 08/31/2011 and earlier.  Findings: Upright AP and lateral views of the chest. Postoperative  changes to the right chest wall and axilla re-identified. Slightly  lower lung volumes. Stable and negative cardiac mediastinal  contours. No pneumothorax, pulmonary edema, pleural effusion or  confluent pulmonary opacity. No acute osseous abnormality  identified. Cervical ACDF hardware partially visible.  IMPRESSION:  No acute cardiopulmonary abnormality.  Original  Report Authenticated By: Erskine Speed, M.D.    EKG:  NSR with second degree AV block type II and RBBB  ASSESSMENT:  1.  New onset second degree AV  block with RBBB 2.  SOB secondary to #1 3.  Dizziness with presyncope secondary to #1 4.  Asthma 5.  Thyroid disease 6.  Hypokalemia 7.  Acute diastolic CHF secondary to #1- her chest xray is clear and no rales on exam so will hold on diuretics at this time  PLAN:   1.  Transfer to [facility] CCU - tele bed 2.  Cycle cardiac enzymes 3.  Zoll pads 4.  Check TSH 5.  Check 2D echo to evaluate LVF 6.  EP consult for PPM 7.  Replete potassium 8.  No temporary pacer indicated at this time since she is hemodynamically stable  Quintella Reichert, MD  12/26/2012  11:25 PM

## 2012-12-26 NOTE — ED Notes (Signed)
Bed: WA01 Expected date:  Expected time:  Means of arrival:  Comments: Triage 2-need bariatric bed

## 2012-12-27 DIAGNOSIS — E039 Hypothyroidism, unspecified: Secondary | ICD-10-CM

## 2012-12-27 DIAGNOSIS — I441 Atrioventricular block, second degree: Secondary | ICD-10-CM | POA: Diagnosis not present

## 2012-12-27 DIAGNOSIS — E876 Hypokalemia: Secondary | ICD-10-CM

## 2012-12-27 DIAGNOSIS — E871 Hypo-osmolality and hyponatremia: Secondary | ICD-10-CM | POA: Diagnosis not present

## 2012-12-27 DIAGNOSIS — R0609 Other forms of dyspnea: Secondary | ICD-10-CM | POA: Diagnosis not present

## 2012-12-27 LAB — COMPREHENSIVE METABOLIC PANEL
AST: 19 U/L (ref 0–37)
Albumin: 3.5 g/dL (ref 3.5–5.2)
Alkaline Phosphatase: 41 U/L (ref 39–117)
Chloride: 104 mEq/L (ref 96–112)
Creatinine, Ser: 0.58 mg/dL (ref 0.50–1.10)
Potassium: 3.9 mEq/L (ref 3.5–5.1)
Total Bilirubin: 0.5 mg/dL (ref 0.3–1.2)
Total Protein: 5.6 g/dL — ABNORMAL LOW (ref 6.0–8.3)

## 2012-12-27 LAB — CBC
MCH: 32.5 pg (ref 26.0–34.0)
MCV: 88.6 fL (ref 78.0–100.0)
Platelets: 190 10*3/uL (ref 150–400)
RDW: 12.9 % (ref 11.5–15.5)

## 2012-12-27 LAB — TSH
TSH: 7.028 u[IU]/mL — ABNORMAL HIGH (ref 0.350–4.500)
TSH: 6.36 u[IU]/mL — ABNORMAL HIGH (ref 0.350–4.500)

## 2012-12-27 LAB — T4, FREE: Free T4: 1.12 ng/dL (ref 0.80–1.80)

## 2012-12-27 MED ORDER — PANTOPRAZOLE SODIUM 40 MG PO TBEC
80.0000 mg | DELAYED_RELEASE_TABLET | Freq: Every day | ORAL | Status: DC
  Administered 2012-12-27 – 2013-01-03 (×7): 80 mg via ORAL
  Filled 2012-12-27: qty 1
  Filled 2012-12-27: qty 2
  Filled 2012-12-27: qty 1
  Filled 2012-12-27 (×3): qty 2
  Filled 2012-12-27 (×2): qty 1
  Filled 2012-12-27: qty 2

## 2012-12-27 MED ORDER — DULOXETINE HCL 20 MG PO CPEP
20.0000 mg | ORAL_CAPSULE | Freq: Every day | ORAL | Status: DC
  Administered 2012-12-27 – 2013-01-03 (×7): 20 mg via ORAL
  Filled 2012-12-27 (×9): qty 1

## 2012-12-27 MED ORDER — LORATADINE 10 MG PO TABS
10.0000 mg | ORAL_TABLET | Freq: Every day | ORAL | Status: DC
  Administered 2012-12-27 – 2013-01-03 (×7): 10 mg via ORAL
  Filled 2012-12-27 (×9): qty 1

## 2012-12-27 MED ORDER — CLONAZEPAM 1 MG PO TABS
1.0000 mg | ORAL_TABLET | Freq: Three times a day (TID) | ORAL | Status: DC | PRN
  Administered 2012-12-27 – 2013-01-02 (×8): 1 mg via ORAL
  Filled 2012-12-27: qty 1
  Filled 2012-12-27: qty 2
  Filled 2012-12-27 (×3): qty 1
  Filled 2012-12-27: qty 2
  Filled 2012-12-27 (×2): qty 1

## 2012-12-27 MED ORDER — LEVOTHYROXINE SODIUM 112 MCG PO TABS
112.0000 ug | ORAL_TABLET | Freq: Every day | ORAL | Status: DC
  Administered 2012-12-27 – 2013-01-03 (×8): 112 ug via ORAL
  Filled 2012-12-27 (×12): qty 1

## 2012-12-27 MED ORDER — FLUTICASONE PROPIONATE 50 MCG/ACT NA SUSP
1.0000 | Freq: Every day | NASAL | Status: DC
  Administered 2012-12-27 – 2012-12-28 (×2): 1 via NASAL
  Filled 2012-12-27 (×2): qty 16

## 2012-12-27 MED ORDER — ACETAMINOPHEN 325 MG PO TABS: 650.0000 mg | ORAL_TABLET | ORAL | Status: DC | PRN

## 2012-12-27 MED ORDER — BUPROPION HCL ER (SR) 150 MG PO TB12
150.0000 mg | ORAL_TABLET | Freq: Every day | ORAL | Status: DC
  Administered 2012-12-27 – 2013-01-03 (×7): 150 mg via ORAL
  Filled 2012-12-27 (×9): qty 1

## 2012-12-27 MED ORDER — SODIUM CHLORIDE 0.9 % IV SOLN: 250.0000 mL | INTRAVENOUS | Status: DC | PRN

## 2012-12-27 MED ORDER — FLUTICASONE PROPIONATE HFA 44 MCG/ACT IN AERO: 2.0000 | INHALATION_SPRAY | Freq: Two times a day (BID) | RESPIRATORY_TRACT | Status: DC

## 2012-12-27 MED ORDER — MOMETASONE FURO-FORMOTEROL FUM 100-5 MCG/ACT IN AERO
2.0000 | INHALATION_SPRAY | Freq: Every day | RESPIRATORY_TRACT | Status: DC
  Administered 2012-12-27 – 2013-01-03 (×7): 2 via RESPIRATORY_TRACT
  Filled 2012-12-27 (×4): qty 8.8

## 2012-12-27 MED ORDER — FAMOTIDINE 40 MG PO TABS
40.0000 mg | ORAL_TABLET | Freq: Every day | ORAL | Status: DC
  Administered 2012-12-27 – 2013-01-02 (×6): 40 mg via ORAL
  Filled 2012-12-27 (×9): qty 1

## 2012-12-27 MED ORDER — TRIAMTERENE-HCTZ 75-50 MG PO TABS
1.0000 | ORAL_TABLET | Freq: Every morning | ORAL | Status: DC
  Administered 2012-12-27 – 2013-01-03 (×6): 1 via ORAL
  Filled 2012-12-27 (×9): qty 1

## 2012-12-27 MED ORDER — DOCUSATE SODIUM 100 MG PO CAPS
200.0000 mg | ORAL_CAPSULE | Freq: Every evening | ORAL | Status: DC
  Administered 2012-12-29 – 2013-01-02 (×4): 200 mg via ORAL
  Filled 2012-12-27 (×10): qty 2

## 2012-12-27 MED ORDER — ONDANSETRON HCL 4 MG/2ML IJ SOLN: 4.0000 mg | Freq: Four times a day (QID) | INTRAMUSCULAR | Status: DC | PRN

## 2012-12-27 MED ORDER — AMPHETAMINE-DEXTROAMPHETAMINE 10 MG PO TABS
10.0000 mg | ORAL_TABLET | Freq: Every day | ORAL | Status: DC
  Administered 2012-12-31 – 2013-01-02 (×3): 10 mg via ORAL
  Filled 2012-12-27 (×5): qty 1

## 2012-12-27 MED ORDER — HEPARIN SODIUM (PORCINE) 5000 UNIT/ML IJ SOLN
5000.0000 [IU] | Freq: Three times a day (TID) | INTRAMUSCULAR | Status: DC
  Filled 2012-12-27 (×4): qty 1

## 2012-12-27 MED ORDER — NITROGLYCERIN 0.4 MG SL SUBL: 0.4000 mg | SUBLINGUAL_TABLET | SUBLINGUAL | Status: DC | PRN

## 2012-12-27 MED ORDER — VITAMIN B-12 100 MCG PO TABS
50.0000 ug | ORAL_TABLET | Freq: Every day | ORAL | Status: DC
  Administered 2012-12-27 – 2012-12-28 (×2): 50 ug via ORAL
  Administered 2012-12-29: 12:00:00 via ORAL
  Administered 2012-12-31 – 2013-01-03 (×4): 50 ug via ORAL
  Filled 2012-12-27 (×9): qty 1

## 2012-12-27 MED ORDER — SODIUM CHLORIDE 0.9 % IJ SOLN
3.0000 mL | INTRAMUSCULAR | Status: DC | PRN
  Administered 2012-12-28: 3 mL via INTRAVENOUS

## 2012-12-27 MED ORDER — MONTELUKAST SODIUM 10 MG PO TABS
10.0000 mg | ORAL_TABLET | Freq: Every day | ORAL | Status: DC
  Administered 2012-12-27 – 2013-01-03 (×7): 10 mg via ORAL
  Filled 2012-12-27 (×9): qty 1

## 2012-12-27 MED ORDER — SODIUM CHLORIDE 0.9 % IJ SOLN
3.0000 mL | Freq: Two times a day (BID) | INTRAMUSCULAR | Status: DC
  Administered 2012-12-27: 22:00:00 via INTRAVENOUS
  Administered 2012-12-27 – 2012-12-29 (×4): 3 mL via INTRAVENOUS

## 2012-12-27 MED ORDER — ALBUTEROL SULFATE HFA 108 (90 BASE) MCG/ACT IN AERS: 2.0000 | INHALATION_SPRAY | Freq: Four times a day (QID) | RESPIRATORY_TRACT | Status: DC | PRN

## 2012-12-27 NOTE — Progress Notes (Signed)
  Echocardiogram 2D Echocardiogram has been performed.  [name] 12/27/2012, 9:31 AM

## 2012-12-27 NOTE — Progress Notes (Signed)
Primary cardiologist: Dr. Armanda Magic  Subjective:   No chest pain or breathlessness at rest.   Objective:   Temp:  [97.5 F (36.4 C)-97.8 F (36.6 C)] 97.5 F (36.4 C) (08/16 0216) Pulse Rate:  [38-40] 40 (08/15 2030) Resp:  [11-24] 15 (08/16 0700) BP: (91-138)/(42-90) 113/46 mmHg (08/16 0700) SpO2:  [97 %-100 %] 98 % (08/16 0700) Weight:  [136 lb 14.5 oz (62.1 kg)] 136 lb 14.5 oz (62.1 kg) (08/16 0215) Last BM Date: 12/27/12  Filed Weights   12/27/12 0215  Weight: 136 lb 14.5 oz (62.1 kg)    Intake/Output Summary (Last 24 hours) at 12/27/12 0734 Last data filed at 12/27/12 0515  Gross per 24 hour  Intake    390 ml  Output    700 ml  Net   -310 ml   Telemetry: Sinus rhythmw with 2:1 heart block.  Exam:  General: Appears comfortable.  Lungs: Clear, nonlabored.  Cardiac: RRR, no gallop.  Abdomen: NABS.  Extremities: No edema.  Lab Results:  Basic Metabolic Panel:  Recent Labs Lab 12/26/12 2025  NA 130*  K 3.1*  CL 93*  CO2 24  GLUCOSE 88  BUN 14  CREATININE 0.70  CALCIUM 9.6  MG 2.0    Liver Function Tests:  Recent Labs Lab 12/26/12 2025  AST 23  ALT 24  ALKPHOS 58  BILITOT 0.3  PROT 6.9  ALBUMIN 4.1    CBC:  Recent Labs Lab 12/26/12 2025  WBC 5.9  HGB 14.4  HCT 40.3  MCV 89.6  PLT 239    Cardiac Enzymes:  Recent Labs Lab 12/26/12 2025  TROPONINI <0.30    BNP:  Recent Labs  12/26/12 2025  PROBNP 763.0*    ECG: Sinus rhythm with 2:1 heart block, right bundle branch block, left anterior fascicular block.   Medications:   Scheduled Medications: . amphetamine-dextroamphetamine  10 mg Oral Daily  . buPROPion  150 mg Oral Daily  . docusate sodium  200 mg Oral QPM  . DULoxetine  20 mg Oral Daily  . famotidine  40 mg Oral QHS  . fluticasone  1 spray Each Nare Daily  . levothyroxine  112 mcg Oral QAC breakfast  . loratadine  10 mg Oral Daily  . mometasone-formoterol  2 puff Inhalation Daily  . montelukast   10 mg Oral Daily  . pantoprazole  80 mg Oral Q1200  . potassium chloride  40 mEq Oral Q4H  . sodium chloride  3 mL Intravenous Q12H  . triamterene-hydrochlorothiazide  1 tablet Oral q morning - 10a  . vitamin B-12  50 mcg Oral Daily     Infusions: . sodium chloride Stopped (12/27/12 0400)     PRN Medications:  sodium chloride, acetaminophen, albuterol, clonazePAM, nitroGLYCERIN, ondansetron (ZOFRAN) IV, sodium chloride   Assessment:   1. Second degree, Mobitz 2 heart block, presumably recent onset with symptomatic shortness of breath although patient does indicate intermittent symptoms over time. ECG with right bundle branch block and left anterior fascicular block. No obvious offending agents. No ACS by enzymes. Hemodynamically stable.   2. Hypothyroidism, TSH 7.0, on Synthroid. Doubt related to above.  3. Hypokalemia and hyponatremia, on diuretic as an outpatient.  3. History of breast cancer, no radiation treatments to the chest.    Plan/Discussion:    Discussed with patient this morning. Continue to replete electrolytes, observe rhythm. No clear indication for temporary pacing wire given hemodynamic stability. Followup cardiac enzymes and echocardiogram pending. Dr. Graciela Husbands is on call  this weekend for EP if the situation changes. Would anticipate pacemaker implantation next week.   Jonelle Sidle, M.D., F.A.C.C.

## 2012-12-28 DIAGNOSIS — I309 Acute pericarditis, unspecified: Secondary | ICD-10-CM | POA: Diagnosis not present

## 2012-12-28 DIAGNOSIS — I441 Atrioventricular block, second degree: Principal | ICD-10-CM

## 2012-12-28 DIAGNOSIS — I5031 Acute diastolic (congestive) heart failure: Secondary | ICD-10-CM | POA: Diagnosis not present

## 2012-12-28 DIAGNOSIS — K222 Esophageal obstruction: Secondary | ICD-10-CM | POA: Diagnosis not present

## 2012-12-28 LAB — BASIC METABOLIC PANEL
CO2: 22 mEq/L (ref 19–32)
Calcium: 9.2 mg/dL (ref 8.4–10.5)
Chloride: 103 mEq/L (ref 96–112)
Glucose, Bld: 105 mg/dL — ABNORMAL HIGH (ref 70–99)
Sodium: 135 mEq/L (ref 135–145)

## 2012-12-28 MED ORDER — VANCOMYCIN HCL IN DEXTROSE 1-5 GM/200ML-% IV SOLN
1000.0000 mg | INTRAVENOUS | Status: DC
  Filled 2012-12-28: qty 200

## 2012-12-28 MED ORDER — CHLORHEXIDINE GLUCONATE 4 % EX LIQD
CUTANEOUS | Status: AC
  Filled 2012-12-28: qty 60

## 2012-12-28 MED ORDER — SODIUM CHLORIDE 0.9 % IV SOLN
INTRAVENOUS | Status: DC
  Administered 2012-12-29: 06:00:00 via INTRAVENOUS

## 2012-12-28 MED ORDER — CHLORHEXIDINE GLUCONATE 4 % EX LIQD
60.0000 mL | Freq: Once | CUTANEOUS | Status: AC
  Administered 2012-12-29: 4 via TOPICAL

## 2012-12-28 NOTE — Progress Notes (Signed)
Patient and husband watching pacemaker education video; pamplets also given.

## 2012-12-28 NOTE — Progress Notes (Signed)
   Primary cardiologist: Dr. Armanda Magic  Subjective:   Had a good nights sleep. No chest pain or breathlessness at rest. Appetite stable.   Objective:   Temp:  [98 F (36.7 C)-98.4 F (36.9 C)] 98.3 F (36.8 C) (08/17 0700) Resp:  [10-22] 17 (08/17 0700) BP: (89-127)/(32-109) 113/44 mmHg (08/17 0700) SpO2:  [96 %-100 %] 100 % (08/17 0724) Last BM Date: 12/27/12  Filed Weights   12/27/12 0215  Weight: 136 lb 14.5 oz (62.1 kg)    Intake/Output Summary (Last 24 hours) at 12/28/12 0728 Last data filed at 12/28/12 0600  Gross per 24 hour  Intake   1080 ml  Output   2253 ml  Net  -1173 ml   Telemetry: Sinus rhythmw with 2:1 heart block.  Exam:  General: Appears comfortable.  Lungs: Clear, nonlabored.  Cardiac: RRR, no gallop.  Abdomen: NABS.  Extremities: No edema.  Lab Results:  Basic Metabolic Panel:  Recent Labs Lab 12/26/12 2025 12/27/12 0839 12/28/12 0543  NA 130* 135 135  K 3.1* 3.9 3.8  CL 93* 104 103  CO2 24 22 22   GLUCOSE 88 90 105*  BUN 14 8 8   CREATININE 0.70 0.58 0.68  CALCIUM 9.6 9.2 9.2  MG 2.0 2.2 2.2    Liver Function Tests:  Recent Labs Lab 12/26/12 2025 12/27/12 0839  AST 23 19  ALT 24 20  ALKPHOS 58 41  BILITOT 0.3 0.5  PROT 6.9 5.6*  ALBUMIN 4.1 3.5    CBC:  Recent Labs Lab 12/26/12 2025 12/27/12 0839  WBC 5.9 5.4  HGB 14.4 13.4  HCT 40.3 36.5  MCV 89.6 88.6  PLT 239 190    Cardiac Enzymes:  Recent Labs Lab 12/27/12 0841 12/27/12 1453 12/27/12 1825  TROPONINI <0.30 <0.30 <0.30     Medications:   Scheduled Medications: . amphetamine-dextroamphetamine  10 mg Oral Daily  . buPROPion  150 mg Oral Daily  . docusate sodium  200 mg Oral QPM  . DULoxetine  20 mg Oral Daily  . famotidine  40 mg Oral QHS  . fluticasone  1 spray Each Nare Daily  . levothyroxine  112 mcg Oral QAC breakfast  . loratadine  10 mg Oral Daily  . mometasone-formoterol  2 puff Inhalation Daily  . montelukast  10 mg Oral Daily   . pantoprazole  80 mg Oral Q1200  . sodium chloride  3 mL Intravenous Q12H  . triamterene-hydrochlorothiazide  1 tablet Oral q morning - 10a  . vitamin B-12  50 mcg Oral Daily    PRN Medications: sodium chloride, acetaminophen, albuterol, clonazePAM, nitroGLYCERIN, ondansetron (ZOFRAN) IV, sodium chloride   Assessment:   1. Second degree, Mobitz 2 heart block, presumably recent onset with symptomatic shortness of breath although patient does indicate intermittent symptoms over time. ECG with right bundle branch block and left anterior fascicular block. No obvious offending agents. No ACS by enzymes. Hemodynamically stable. Echocardiogram is pending.  2. Hypothyroidism, TSH 7.0, on Synthroid. Doubt related to above.  3. Hypokalemia and hyponatremia, on diuretic as an outpatient. Corrected.  3. History of breast cancer, no radiation treatments to the chest.    Plan/Discussion:    Followup echocardiogram when available. Will keep n.p.o. after midnight for EP evaluation tomorrow to discuss pacemaker options. Dr. Graciela Husbands is on call this weekend for EP if the situation changes sooner.   Jonelle Sidle, M.D., F.A.C.C.

## 2012-12-29 ENCOUNTER — Inpatient Hospital Stay (HOSPITAL_COMMUNITY): Payer: Medicare Other

## 2012-12-29 ENCOUNTER — Encounter (HOSPITAL_COMMUNITY)
Admission: EM | Disposition: A | Discharge status: Home / Self Care | Payer: Self-pay | Source: Home / Self Care | Attending: Internal Medicine

## 2012-12-29 DIAGNOSIS — R0989 Other specified symptoms and signs involving the circulatory and respiratory systems: Secondary | ICD-10-CM

## 2012-12-29 DIAGNOSIS — I441 Atrioventricular block, second degree: Secondary | ICD-10-CM

## 2012-12-29 DIAGNOSIS — R0609 Other forms of dyspnea: Secondary | ICD-10-CM

## 2012-12-29 DIAGNOSIS — R079 Chest pain, unspecified: Secondary | ICD-10-CM | POA: Diagnosis not present

## 2012-12-29 HISTORY — PX: PERMANENT PACEMAKER INSERTION: SHX5480

## 2012-12-29 HISTORY — PX: LEFT HEART CATHETERIZATION WITH CORONARY ANGIOGRAM: SHX5451

## 2012-12-29 LAB — CBC
MCH: 31.7 pg (ref 26.0–34.0)
MCHC: 35 g/dL (ref 30.0–36.0)
MCV: 90.7 fL (ref 78.0–100.0)
Platelets: 195 10*3/uL (ref 150–400)
RBC: 4.6 MIL/uL (ref 3.87–5.11)
RDW: 13.1 % (ref 11.5–15.5)

## 2012-12-29 LAB — SURGICAL PCR SCREEN: MRSA, PCR: NEGATIVE

## 2012-12-29 LAB — BASIC METABOLIC PANEL
CO2: 22 mEq/L (ref 19–32)
Calcium: 9.2 mg/dL (ref 8.4–10.5)
Chloride: 98 mEq/L (ref 96–112)
Glucose, Bld: 98 mg/dL (ref 70–99)
Sodium: 131 mEq/L — ABNORMAL LOW (ref 135–145)

## 2012-12-29 SURGERY — PERMANENT PACEMAKER INSERTION
Anesthesia: LOCAL

## 2012-12-29 SURGERY — LEFT HEART CATHETERIZATION WITH CORONARY ANGIOGRAM
Anesthesia: LOCAL

## 2012-12-29 MED ORDER — VANCOMYCIN HCL IN DEXTROSE 1-5 GM/200ML-% IV SOLN
1000.0000 mg | INTRAVENOUS | Status: AC
  Filled 2012-12-29: qty 200

## 2012-12-29 MED ORDER — ACETAMINOPHEN 325 MG PO TABS: 325.0000 mg | ORAL_TABLET | ORAL | Status: DC | PRN

## 2012-12-29 MED ORDER — HEPARIN (PORCINE) IN NACL 2-0.9 UNIT/ML-% IJ SOLN
INTRAMUSCULAR | Status: AC
  Filled 2012-12-29: qty 500

## 2012-12-29 MED ORDER — SODIUM CHLORIDE 0.9 % IJ SOLN: 3.0000 mL | Freq: Two times a day (BID) | INTRAMUSCULAR | Status: DC

## 2012-12-29 MED ORDER — VANCOMYCIN HCL IN DEXTROSE 1-5 GM/200ML-% IV SOLN
1000.0000 mg | Freq: Two times a day (BID) | INTRAVENOUS | Status: AC
  Administered 2012-12-29: 1000 mg via INTRAVENOUS
  Filled 2012-12-29: qty 200

## 2012-12-29 MED ORDER — MIDAZOLAM HCL 2 MG/2ML IJ SOLN
INTRAMUSCULAR | Status: AC
  Filled 2012-12-29: qty 2

## 2012-12-29 MED ORDER — SODIUM CHLORIDE 0.9 % IV SOLN: 250.0000 mL | INTRAVENOUS | Status: DC | PRN

## 2012-12-29 MED ORDER — ALUM & MAG HYDROXIDE-SIMETH 200-200-20 MG/5ML PO SUSP
30.0000 mL | Freq: Four times a day (QID) | ORAL | Status: DC | PRN
Start: 2012-12-29 — End: 2013-01-03
  Administered 2012-12-29 – 2012-12-30 (×2): 30 mL via ORAL
  Filled 2012-12-29 (×2): qty 30

## 2012-12-29 MED ORDER — SODIUM CHLORIDE 0.9 % IV SOLN: INTRAVENOUS | Status: DC

## 2012-12-29 MED ORDER — SODIUM CHLORIDE 0.9 % IV SOLN: INTRAVENOUS | Status: AC

## 2012-12-29 MED ORDER — HEPARIN (PORCINE) IN NACL 2-0.9 UNIT/ML-% IJ SOLN
INTRAMUSCULAR | Status: AC
  Filled 2012-12-29: qty 1000

## 2012-12-29 MED ORDER — LIDOCAINE HCL (PF) 1 % IJ SOLN
INTRAMUSCULAR | Status: AC
  Filled 2012-12-29: qty 30

## 2012-12-29 MED ORDER — NITROGLYCERIN 0.2 MG/ML ON CALL CATH LAB
INTRAVENOUS | Status: AC
  Filled 2012-12-29: qty 1

## 2012-12-29 MED ORDER — FENTANYL CITRATE 0.05 MG/ML IJ SOLN
25.0000 ug | Freq: Once | INTRAMUSCULAR | Status: AC
  Administered 2012-12-29: 25 ug via INTRAVENOUS
  Filled 2012-12-29: qty 0.5

## 2012-12-29 MED ORDER — FENTANYL CITRATE 0.05 MG/ML IJ SOLN
INTRAMUSCULAR | Status: AC
  Filled 2012-12-29: qty 2

## 2012-12-29 MED ORDER — SODIUM CHLORIDE 0.9 % IR SOLN
Status: AC
  Filled 2012-12-29 (×2): qty 2

## 2012-12-29 MED ORDER — SODIUM CHLORIDE 0.9 % IJ SOLN: 3.0000 mL | INTRAMUSCULAR | Status: DC | PRN

## 2012-12-29 MED ORDER — SODIUM CHLORIDE 0.9 % IV SOLN
1.0000 mL/kg/h | INTRAVENOUS | Status: AC
  Administered 2012-12-29: 1 mL/kg/h via INTRAVENOUS

## 2012-12-29 MED ORDER — ONDANSETRON HCL 4 MG/2ML IJ SOLN
4.0000 mg | Freq: Four times a day (QID) | INTRAMUSCULAR | Status: DC | PRN
  Administered 2012-12-30: 4 mg via INTRAVENOUS
  Filled 2012-12-29: qty 2

## 2012-12-29 MED ORDER — VERAPAMIL HCL 2.5 MG/ML IV SOLN
INTRAVENOUS | Status: AC
  Filled 2012-12-29: qty 2

## 2012-12-29 MED ORDER — ASPIRIN 81 MG PO CHEW
CHEWABLE_TABLET | ORAL | Status: AC
  Filled 2012-12-29: qty 4

## 2012-12-29 MED ORDER — MIDAZOLAM HCL 5 MG/5ML IJ SOLN
INTRAMUSCULAR | Status: AC
  Filled 2012-12-29: qty 5

## 2012-12-29 MED ORDER — ASPIRIN 81 MG PO CHEW
324.0000 mg | CHEWABLE_TABLET | ORAL | Status: AC
  Administered 2012-12-29: 324 mg via ORAL

## 2012-12-29 MED ORDER — OXYCODONE HCL 5 MG PO TABS
2.5000 mg | ORAL_TABLET | Freq: Once | ORAL | Status: AC
  Administered 2012-12-29: 2.5 mg via ORAL

## 2012-12-29 MED ORDER — OXYCODONE HCL 5 MG PO TABS
2.5000 mg | ORAL_TABLET | Freq: Once | ORAL | Status: AC
  Administered 2012-12-29: 21:00:00 2.5 mg via ORAL
  Filled 2012-12-29: qty 1

## 2012-12-29 MED ORDER — LIDOCAINE HCL (PF) 1 % IJ SOLN
INTRAMUSCULAR | Status: AC
  Filled 2012-12-29: qty 60

## 2012-12-29 MED ORDER — HEPARIN SODIUM (PORCINE) 1000 UNIT/ML IJ SOLN
INTRAMUSCULAR | Status: AC
  Filled 2012-12-29: qty 1

## 2012-12-29 NOTE — Care Management Note (Signed)
    Page 1 of 1   12/29/2012     11:57:47 AM   CARE MANAGEMENT NOTE 12/29/2012  Patient:  [name], [name]   Account Number:  [id number]  Date Initiated:  12/29/2012  Documentation initiated by:  Junius Creamer  Subjective/Objective Assessment:   adm w sec deg heart block     Action/Plan:   lives w husband, pcp dr [name]   Anticipated DC Date:     Anticipated DC Plan:        DC Planning Services  CM consult      Choice offered to / List presented to:             Status of service:   Medicare Important Message given?   (If response is "NO", the following Medicare IM given date fields will be blank) Date Medicare IM given:   Date Additional Medicare IM given:    Discharge Disposition:    Per UR Regulation:  Reviewed for med. necessity/level of care/duration of stay  If discussed at Long Length of Stay Meetings, dates discussed:    Comments:

## 2012-12-29 NOTE — Progress Notes (Signed)
Orthopedic Tech Progress Note Patient Details:  [name] 03-Oct-1945 [id number] Sling did not come up with so it was ordered from ortho dept Patient ID: [name], female   DOB: [birth year], 67 y.o.   MRN: [id number]   [name] 12/29/2012, 5:04 PM

## 2012-12-29 NOTE — CV Procedure (Signed)
Preop DX::intermittent 2:1AV Post op DX:: same  Procedure  dual pacemaker implantation  After routine prep and drape, lidocaine was infiltrated in the prepectoral subclavicular region on the left side an incision was made and carried down to later the prepectoral fascia using electrocautery and sharp dissection a pocket was formed similarly. Hemostasis was obtained.  After this, we turned our attention to gaining accessm to the extrathoracic,left subclavian vein. This was accomplished without difficulty and without the aspiration of air or puncture of the artery. 2 separate venipunctures were accomplished; guidewires were placed and retained and sequentially 7 French sheath through which were  passed a Medtronic ventricular lead serial [id number] and an Medtronic  atrial lead serial number [id number] .  The ventricular lead was manipulated to the right ventricular apex with a bipolar R wave was 6.8, the pacing impedance was 1216, the threshold was 0.9 @ 0.5 msec  Current at threshold was   0.8  Ma and the current of injury was  brisk.  The right atrial lead was manipulated to the right atrial appendage  with a bipolar P-wave  2.0, the pacing impedance was 632, the threshold 1.4@ 0.5 msec   Current at threshold was 2.4  Ma and the current of injury was brisk.  The ventricular lead was marked with a tie prior to the insertion of the atrial lead. The leads were affixed to the prepectoral fascia and attached to a  Medtronic  pulse generator serial number [id number].  Hemostasis was obtained. The pocket was copiously irrigated with antibiotic containing saline solution. The leads and the pulse generator were placed in the pocket and affixed to the prepectoral fascia. The wound was then closed in 2 layers in the normal fashion.  The wound was washed dried . And a dermabond adhesive was applied   Needle  Count, sponge counts and instrument counts were correct at the end of the procedure .   The  patient tolerated the procedure without apparent complication.  [name].D.

## 2012-12-29 NOTE — Consult Note (Signed)
 ELECTROPHYSIOLOGY CONSULT NOTE  Patient ID: [name], MRN: [id number], DOB/AGE: 07/17/1945 67 y.o. Admit date: 12/26/2012 Date of Consult: 12/29/2012  Primary Physician: ARONSON,RICHARD A, MD Primary Cardiologist:  new  Chief Complaint:  Syncope, near syncope   HPI [name] is a 67 y.o. female seen for pacemaker implantation.  She has a complex past medical history without significant heart disease. She has a history of dyspnea on exertion about 3 years ago for which no specific cause was ever elucidated and which have been resolved. She was seen by pulmonary at that time.  3-6 months ago she again began to notice dyspnea on exertion now accompanied by chest tightness. This would resolve in about 1 minute following rest. It would recur with recurrent exertion.  2 days prior to admission she had exertional lightheadedness. She was climbing a flight of stairs and became presyncopal. This is accompanied by worsening shortness of breath. She had had recurring episodes of lightheadedness and presyncope while climbing stairs. She came into the emergency room was found to be in 2-1 heart block with a heart rate of 30; she is feeling much better now that her heart rate is 121.  Cardiac evaluation has included negative cardiac enzymes; echo demonstrated normal left ventricular function. Her TSH was mildly elevated but her T4 was normal. She is on no medications to cause AV nodal blockade.      Past Medical History  Diagnosis Date  . Diverticulosis   . GERD (gastroesophageal reflux disease)   . Esophageal stricture   . Hemorrhoids   . Mallory - Weiss tear   . Interstitial cystitis   . ADHD (attention deficit hyperactivity disorder)   . Breast cancer     right side  . Arthritis   . Blood transfusion   . Thyroid disease   . Allergy     trees/pollen, mold, fungus, dust mites. Takes allergy shots  . Latex allergy, contact dermatitis   . Cataract   . Depression     . Hernia of abdominal wall     spigelian hernia RLQ  . H/O hiatal hernia   . Neuromuscular disorder     central nervous system neuropathy- seen per Dr Love  . Asthma     allergist Dr Kozlo- monthly allergy injections  . Complication of anesthesia     reaction to some anesthetics/ 7/12 anesth record on chart- states prefers epidural  . PONV (postoperative nausea and vomiting)     pt needs scop patch  . Recurrent upper respiratory infection (URI) 1/13- to present    bronchitis following surgery- states improved but still with cough. OV with Clear[facility] Dr Aronson 09/06/11 on chart      Surgical History:  Past Surgical History  Procedure Laterality Date  . Mastectomy modified radical      right; with immediate reconstruction  . Laparoscopy  1973  . Abdominal hysterectomy  1974  . Cystoscopy  1975, 2007  . Myringoplasty  1962  . Tympanoplasty  1973    right  . Oophorectomy  1982  . Bladder suspension    . Rectocele repair      with cystocele repair  . Total hip arthroplasty    . Ventral hernia repair  05/30/2011    Procedure: HERNIA REPAIR VENTRAL ADULT;  Surgeon: Christian J Streck, MD;  Location: [facility];  Service: General;  Laterality: Right;  repair right spigelian hernia  . Breast biopsy  2002    NO BLOOD PRESSURES ON   RIGHT SIDE/   s/p  axillary node dissection  . Tonsillectomy    . Back surgery      cervical fusion 4-5 with plate  . Brain surgery      bilateral lasik  . Cysto with hydrodistension  09/07/2011    Procedure: CYSTOSCOPY/HYDRODISTENSION;  Surgeon: Sigmund I Tannenbaum, MD;  Location: [facility];  Service: Urology;  Laterality: N/A;  INSTILLATION OF MARCAINE/PYRIDIUM INSTILLATION OF MARCAINE/KENALOG     Home Meds: Prior to Admission medications   Medication Sig Start Date End Date Taking? Authorizing Provider  albuterol (PROVENTIL HFA;VENTOLIN HFA) 108 (90 BASE) MCG/ACT inhaler Inhale 2 puffs into the lungs every 6 (six) hours as needed. For  shortness of breath   Yes Historical Provider, MD  amphetamine-dextroamphetamine (ADDERALL) 10 MG tablet Take 10 mg by mouth daily.    Yes Historical Provider, MD  Ascorbic Acid (VITAMIN C) 100 MG tablet Take 100 mg by mouth daily.    Yes Historical Provider, MD  b complex vitamins capsule Take 1 capsule by mouth daily.    Yes Historical Provider, MD  buPROPion (WELLBUTRIN SR) 150 MG 12 hr tablet Take 150 mg by mouth daily.    Yes Historical Provider, MD  calcium carbonate (TUMS - DOSED IN MG ELEMENTAL CALCIUM) 500 MG chewable tablet Chew 1 tablet by mouth as needed.    Yes Historical Provider, MD  Calcium Carbonate-Vitamin D (CALTRATE 600+D) 600-400 MG-UNIT per chew tablet Chew 1 tablet by mouth daily.    Yes Historical Provider, MD  cholecalciferol (VITAMIN D) 1000 UNITS tablet Take 1,000 Units by mouth daily.   Yes Historical Provider, MD  clonazePAM (KLONOPIN) 1 MG tablet Take 1 mg by mouth 3 (three) times daily as needed.    Yes Historical Provider, MD  docusate sodium (COLACE) 100 MG capsule Take 200 mg by mouth every evening.    Yes Historical Provider, MD  DULoxetine (CYMBALTA) 20 MG capsule Take 20 mg by mouth daily.   Yes Historical Provider, MD  esomeprazole (NEXIUM) 40 MG capsule Take 40 mg by mouth daily before breakfast.    Yes Historical Provider, MD  estradiol (ESTRING) 2 MG vaginal ring Place 2 mg vaginally every 3 (three) months. follow package directions   Yes Historical Provider, MD  estradiol (VIVELLE-DOT) 0.025 MG/24HR Place 1 patch onto the skin 2 (two) times a week.    Yes Historical Provider, MD  fexofenadine (ALLEGRA) 180 MG tablet Take 180 mg by mouth daily.    Yes Historical Provider, MD  fish oil-omega-3 fatty acids 1000 MG capsule Take 2 g by mouth daily.   Yes Historical Provider, MD  Homeopathic Products (ZICAM ALLERGY RELIEF NA) Place 1 spray into the nose 2 (two) times daily as needed. For allergy relief   Yes Historical Provider, MD  levothyroxine (SYNTHROID,  LEVOTHROID) 112 MCG tablet Take 112 mcg by mouth every morning.    Yes Historical Provider, MD  magnesium 30 MG tablet Take 30 mg by mouth daily.    Yes Historical Provider, MD  mometasone (ASMANEX) 220 MCG/INH inhaler Inhale 2 puffs into the lungs daily.   Yes Historical Provider, MD  mometasone (NASONEX) 50 MCG/ACT nasal spray Place 2 sprays into the nose daily.   Yes Historical Provider, MD  mometasone-formoterol (DULERA) 100-5 MCG/ACT AERO Inhale 2 puffs into the lungs.   Yes Historical Provider, MD  montelukast (SINGULAIR) 10 MG tablet Take 10 mg by mouth daily.   Yes Historical Provider, MD  Olive Leaf 250 MG CAPS Take 1   capsule by mouth.   Yes Historical Provider, MD  pentosan polysulfate (ELMIRON) 100 MG capsule Take 100 mg by mouth 3 (three) times daily.   Yes Historical Provider, MD  pirbuterol (MAXAIR) 200 MCG/INH inhaler Inhale 2 puffs into the lungs 4 (four) times daily as needed.    Yes Historical Provider, MD  polyethylene glycol (MIRALAX / GLYCOLAX) packet Take 17 g by mouth as needed.    Yes Historical Provider, MD  Potassium 75 MG TABS Take by mouth.    Yes Historical Provider, MD  Probiotic Product (ALIGN) 4 MG CAPS Take 4 mg by mouth daily.   Yes Historical Provider, MD  ranitidine (ZANTAC) 300 MG tablet Take 300 mg by mouth at bedtime.   Yes Historical Provider, MD  triamterene-hydrochlorothiazide (MAXZIDE) 75-50 MG per tablet Take 1 tablet by mouth every morning.    Yes Historical Provider, MD  vitamin B-12 (CYANOCOBALAMIN) 100 MCG tablet Take 50 mcg by mouth daily.    Yes Historical Provider, MD    Inpatient Medications:  . amphetamine-dextroamphetamine  10 mg Oral Daily  . buPROPion  150 mg Oral Daily  . docusate sodium  200 mg Oral QPM  . DULoxetine  20 mg Oral Daily  . famotidine  40 mg Oral QHS  . fluticasone  1 spray Each Nare Daily  . levothyroxine  112 mcg Oral QAC breakfast  . loratadine  10 mg Oral Daily  . mometasone-formoterol  2 puff Inhalation Daily  .  montelukast  10 mg Oral Daily  . pantoprazole  80 mg Oral Q1200  . sodium chloride  3 mL Intravenous Q12H  . triamterene-hydrochlorothiazide  1 tablet Oral q morning - 10a  . vancomycin  1,000 mg Intravenous On Call  . vitamin B-12  50 mcg Oral Daily     Allergies:  Allergies  Allergen Reactions  . Anesthetics, Amide     Patient unsure of names, however multiples cause swelling of airway  . Bactroban Other (See Comments)    Causes sores in nose  . Ciprofloxacin     REACTION: joint swelling  . Codeine Swelling  . Meperidine Hcl Nausea Only    Hallucinations   . Morphine Nausea Only    Hallucinations   . Neosporin [Neomycin-Polymyxin-Gramicidin] Dermatitis    All topical "orin's ointment"  . Nitrofurantoin     REACTION: neuropathy in legs  . Penicillins Other (See Comments)    Swelling in joints  . Sulfonamide Derivatives     REACTION: swelling, anaphylaxis  . Latex Rash    History   Social History  . Marital Status: Married    Spouse Name: N/A    Number of Children: 2  . Years of Education: N/A   Occupational History  . vp [employer]   .     Social History Main Topics  . Smoking status: Never Smoker   . Smokeless tobacco: Never Used  . Alcohol Use: 4.2 oz/week    7 Glasses of wine per week     Comment: nightly  . Drug Use: No  . Sexual Activity: Not on file   Other Topics Concern  . Not on file   Social History Narrative  . No narrative on file     Family History  Problem Relation Age of Onset  . Heart disease Father   . Diabetes      aunt  . Uterine cancer      grandmother  . Colon cancer Mother   . Depression Mother   .   Cancer Mother     colon  . Pancreatic cancer Sister   . Cancer Sister     pancreatic  . Cancer Maternal Grandmother     ovarian     ROS:  Please see the history of present illness.     All other systems reviewed and negative.    Physical Exam: Blood pressure 117/54, pulse 40, temperature 97.4  F (36.3 C), temperature source Oral, resp. rate 16, height 5' 6.5" (1.689 m), weight 139 lb 1.8 oz (63.1 kg), SpO2 100.00%. General: Well developed, well nourished female in no acute distress. Head: Normocephalic, atraumatic, sclera non-icteric, no xanthomas, nares are without discharge. EENT: normal Lymph Nodes:  none Back: without scoliosis/kyphosis, no CVA tendersness Neck: Negative for carotid bruits. JVD not elevated. Lungs: Clear bilaterally to auscultation without wheezes, rales, or rhonchi. Breathing is unlabored. Heart: RRR with S1 S2. No murmur , rubs, or gallops appreciated. Abdomen: Soft, non-tender, non-distended with normoactive bowel sounds. No hepatomegaly. No rebound/guarding. No obvious abdominal masses. Msk:  Strength and tone appear normal for age. Extremities: No clubbing or cyanosis. No  edema.  Distal pedal pulses are 2+ and equal bilaterally. Skin: Warm and Dry Neuro: Alert and oriented X 3. CN III-XII intact Grossly normal sensory and motor function . Psych:  Responds to questions appropriately with a normal affect.      Labs: Cardiac Enzymes  Recent Labs  12/26/12 2025 12/27/12 0841 12/27/12 1453 12/27/12 1825  TROPONINI <0.30 <0.30 <0.30 <0.30   CBC Lab Results  Component Value Date   WBC 6.6 12/29/2012   HGB 14.6 12/29/2012   HCT 41.7 12/29/2012   MCV 90.7 12/29/2012   PLT 195 12/29/2012   PROTIME:  Recent Labs  12/27/12 0839  LABPROT 13.0  INR 1.00   Chemistry  Recent Labs Lab 12/27/12 0839  12/29/12 0440  NA 135  < > 131*  K 3.9  < > 3.6  CL 104  < > 98  CO2 22  < > 22  BUN 8  < > 10  CREATININE 0.58  < > 0.65  CALCIUM 9.2  < > 9.2  PROT 5.6*  --   --   BILITOT 0.5  --   --   ALKPHOS 41  --   --   ALT 20  --   --   AST 19  --   --   GLUCOSE 90  < > 98  < > = values in this interval not displayed. Lipids No results found for this basename: CHOL, HDL, LDLCALC, TRIG   BNP Pro B Natriuretic peptide (BNP)  Date/Time Value  Range Status  12/26/2012  8:25 PM 763.0* 0 - 125 pg/mL Final   Miscellaneous Lab Results  Component Value Date   DDIMER  Value: <0.22        AT THE INHOUSE ESTABLISHED CUTOFF VALUE OF 0.48 ug/mL FEU, THIS ASSAY HAS BEEN DOCUMENTED IN THE LITERATURE TO HAVE A SENSITIVITY AND NEGATIVE PREDICTIVE VALUE OF AT LEAST 98 TO 99%.  THE TEST RESULT SHOULD BE CORRELATED WITH AN ASSESSMENT OF THE CLINICAL PROBABILITY OF DVT / VTE. 02/09/2009    Radiology/Studies:  Dg Chest 2 View  12/26/2012   *RADIOLOGY REPORT*  Clinical Data: 67-year-old female with pain.  CHEST - 2 VIEW  Comparison: 08/31/2011 and earlier.  Findings: Upright AP and lateral views of the chest.  Postoperative changes to the right chest wall and axilla re-identified.  Slightly lower lung volumes.  Stable and negative cardiac mediastinal   contours.  No pneumothorax, pulmonary edema, pleural effusion or confluent pulmonary opacity. No acute osseous abnormality identified.  Cervical ACDF hardware partially visible.  IMPRESSION: No acute cardiopulmonary abnormality.   Original Report Authenticated By: H. Hall III, M.D.    EKG: NSR with RBBB and LAD   Assessment and Plan:     Active Problems:   Second degree heart block   Hypokalemia   Hypothyroidism   Hyponatremia   The patient has underlying bifascicular block and intermittent high-grade symptomatic heart block without a reversible cause. This is an appropriate class I indication for pacing.  We have discussed the potential benefits and risks including but not limited to perforation infection lead dislodgment. She has need for MRI. We have discussed MRI conditionality We'll further discuss that in [place] MRI condition her pacemaker has been late to the Medtronic 5076-lead as opposed to 5086-lead, a  lead which has a higher rate perforation in the 5076-lead. They're presently trying to decide which lead they would prefer.  In addition we have to explore the cause of her heart block  although it occurs in the context of chronic bifascicular block. Still the exclusion of systemic disease such as Lyme disease and sarcoid are appropriate in his relatively young lady. Furthermore, we have dyspnea on exertion accompanied by chest pain. We'll need to exclude coronary disease    Steven Klein   

## 2012-12-29 NOTE — Progress Notes (Signed)
Cardiology Night Cross Cover Called regarding chest and left arm pain post pacer implant today. Given Tylenol without relief, oxycodone 2.5 mg x 2 added prn. Pain with pleuritic component. Vitals stable. Continued pain despite additional meds --> pcxray demonstrated no PTX. Given maalox. Saw pt after above meds given and she reports some improvement in the pain. No rub on exam, able to reproduce the pain with minimal epigastric palpation. Pacer site c/d/i. Breathsounds equal. Does this pacer have Autocapture-like (it is Medtronic) feature as occasionally two ventricular spikes seen? (or is this artifact added by telemetry)? Otherwise, no evidence of failed capture. By exam and improvement with Maalox, GI etiology seems likely; however, will monitor closely. Low threshold to get PA and lateral cxray soon rather than later.

## 2012-12-29 NOTE — Interval H&P Note (Signed)
History and Physical Interval Note:  12/29/2012 10:35 AM  [name]  has presented today for surgery, with the diagnosis of cp  The various methods of treatment have been discussed with the patient and family. After consideration of risks, benefits and other options for treatment, the patient has consented to  Procedure(s): LEFT HEART CATHETERIZATION WITH CORONARY ANGIOGRAM (N/A) as a surgical intervention .  The patient's history has been reviewed, patient examined, no change in status, stable for surgery.  I have reviewed the patient's chart and labs.  Questions were answered to the patient's satisfaction.    Cath Lab Visit (complete for each Cath Lab visit)  Clinical Evaluation Leading to the Procedure:   ACS: no  Non-ACS:    Anginal Classification: CCS II  Anti-ischemic medical therapy: No Therapy  Non-Invasive Test Results: No non-invasive testing performed  Prior CABG: No previous CABG       Theron [facility] 12/29/2012 10:36 AM

## 2012-12-29 NOTE — H&P (View-Only) (Signed)
ELECTROPHYSIOLOGY CONSULT NOTE  Patient ID: [name], MRN: [id number], DOB/AGE: 11-18-1945 67 y.o. Admit date: 12/26/2012 Date of Consult: 12/29/2012  Primary Physician: Minda Meo, MD Primary Cardiologist:  new  Chief Complaint:  Syncope, near syncope   HPI [name] is a 67 y.o. female seen for pacemaker implantation.  She has a complex past medical history without significant heart disease. She has a history of dyspnea on exertion about 3 years ago for which no specific cause was ever elucidated and which have been resolved. She was seen by pulmonary at that time.  3-6 months ago she again began to notice dyspnea on exertion now accompanied by chest tightness. This would resolve in about 1 minute following rest. It would recur with recurrent exertion.  2 days prior to admission she had exertional lightheadedness. She was climbing a flight of stairs and became presyncopal. This is accompanied by worsening shortness of breath. She had had recurring episodes of lightheadedness and presyncope while climbing stairs. She came into the emergency room was found to be in 2-1 heart block with a heart rate of 30; she is feeling much better now that her heart rate is 121.  Cardiac evaluation has included negative cardiac enzymes; echo demonstrated normal left ventricular function. Her TSH was mildly elevated but her T4 was normal. She is on no medications to cause AV nodal blockade.      Past Medical History  Diagnosis Date  . Diverticulosis   . GERD (gastroesophageal reflux disease)   . Esophageal stricture   . Hemorrhoids   . Mallory - Weiss tear   . Interstitial cystitis   . ADHD (attention deficit hyperactivity disorder)   . Breast cancer     right side  . Arthritis   . Blood transfusion   . Thyroid disease   . Allergy     trees/pollen, mold, fungus, dust mites. Takes allergy shots  . Latex allergy, contact dermatitis   . Cataract   . Depression     . Hernia of abdominal wall     spigelian hernia RLQ  . H/O hiatal hernia   . Neuromuscular disorder     central nervous system neuropathy- seen per Dr Sandria Manly  . Asthma     allergist Dr Illene Labrador- monthly allergy injections  . Complication of anesthesia     reaction to some anesthetics/ 7/12 anesth record on chart- states prefers epidural  . PONV (postoperative nausea and vomiting)     pt needs scop patch  . Recurrent upper respiratory infection (URI) 1/13- to present    bronchitis following surgery- states improved but still with cough. OV with Clearance Dr [name] 09/06/11 on chart      Surgical History:  Past Surgical History  Procedure Laterality Date  . Mastectomy modified radical      right; with immediate reconstruction  . Laparoscopy  1973  . Abdominal hysterectomy  1974  . Cystoscopy  1975, 2007  . Myringoplasty  1962  . Tympanoplasty  1973    right  . Oophorectomy  1982  . Bladder suspension    . Rectocele repair      with cystocele repair  . Total hip arthroplasty    . Ventral hernia repair  05/30/2011    Procedure: HERNIA REPAIR VENTRAL ADULT;  Surgeon: Currie Paris, MD;  Location: [facility];  Service: General;  Laterality: Right;  repair right spigelian hernia  . Breast biopsy  2002    NO BLOOD PRESSURES ON  RIGHT SIDE/   s/p  axillary node dissection  . Tonsillectomy    . Back surgery      cervical fusion 4-5 with plate  . Brain surgery      bilateral lasik  . Cysto with hydrodistension  09/07/2011    Procedure: CYSTOSCOPY/HYDRODISTENSION;  Surgeon: Kathi Ludwig, MD;  Location: [facility];  Service: Urology;  Laterality: N/A;  INSTILLATION OF MARCAINE/PYRIDIUM INSTILLATION OF MARCAINE/KENALOG     Home Meds: Prior to Admission medications   Medication Sig Start Date End Date Taking? Authorizing Provider  albuterol (PROVENTIL HFA;VENTOLIN HFA) 108 (90 BASE) MCG/ACT inhaler Inhale 2 puffs into the lungs every 6 (six) hours as needed. For  shortness of breath   Yes Historical Provider, MD  amphetamine-dextroamphetamine (ADDERALL) 10 MG tablet Take 10 mg by mouth daily.    Yes Historical Provider, MD  Ascorbic Acid (VITAMIN C) 100 MG tablet Take 100 mg by mouth daily.    Yes Historical Provider, MD  b complex vitamins capsule Take 1 capsule by mouth daily.    Yes Historical Provider, MD  buPROPion (WELLBUTRIN SR) 150 MG 12 hr tablet Take 150 mg by mouth daily.    Yes Historical Provider, MD  calcium carbonate (TUMS - DOSED IN MG ELEMENTAL CALCIUM) 500 MG chewable tablet Chew 1 tablet by mouth as needed.    Yes Historical Provider, MD  Calcium Carbonate-Vitamin D (CALTRATE 600+D) 600-400 MG-UNIT per chew tablet Chew 1 tablet by mouth daily.    Yes Historical Provider, MD  cholecalciferol (VITAMIN D) 1000 UNITS tablet Take 1,000 Units by mouth daily.   Yes Historical Provider, MD  clonazePAM (KLONOPIN) 1 MG tablet Take 1 mg by mouth 3 (three) times daily as needed.    Yes Historical Provider, MD  docusate sodium (COLACE) 100 MG capsule Take 200 mg by mouth every evening.    Yes Historical Provider, MD  DULoxetine (CYMBALTA) 20 MG capsule Take 20 mg by mouth daily.   Yes Historical Provider, MD  esomeprazole (NEXIUM) 40 MG capsule Take 40 mg by mouth daily before breakfast.    Yes Historical Provider, MD  estradiol (ESTRING) 2 MG vaginal ring Place 2 mg vaginally every 3 (three) months. follow package directions   Yes Historical Provider, MD  estradiol (VIVELLE-DOT) 0.025 MG/24HR Place 1 patch onto the skin 2 (two) times a week.    Yes Historical Provider, MD  fexofenadine (ALLEGRA) 180 MG tablet Take 180 mg by mouth daily.    Yes Historical Provider, MD  fish oil-omega-3 fatty acids 1000 MG capsule Take 2 g by mouth daily.   Yes Historical Provider, MD  Homeopathic Products [facility] ALLERGY RELIEF NA) Place 1 spray into the nose 2 (two) times daily as needed. For allergy relief   Yes Historical Provider, MD  levothyroxine (SYNTHROID,  LEVOTHROID) 112 MCG tablet Take 112 mcg by mouth every morning.    Yes Historical Provider, MD  magnesium 30 MG tablet Take 30 mg by mouth daily.    Yes Historical Provider, MD  mometasone [facility]) 220 MCG/INH inhaler Inhale 2 puffs into the lungs daily.   Yes Historical Provider, MD  mometasone (NASONEX) 50 MCG/ACT nasal spray Place 2 sprays into the nose daily.   Yes Historical Provider, MD  mometasone-formoterol (DULERA) 100-5 MCG/ACT AERO Inhale 2 puffs into the lungs.   Yes Historical Provider, MD  montelukast (SINGULAIR) 10 MG tablet Take 10 mg by mouth daily.   Yes Historical Provider, MD  Olive Leaf 250 MG CAPS Take 1  capsule by mouth.   Yes Historical Provider, MD  pentosan polysulfate (ELMIRON) 100 MG capsule Take 100 mg by mouth 3 (three) times daily.   Yes Historical Provider, MD  pirbuterol (MAXAIR) 200 MCG/INH inhaler Inhale 2 puffs into the lungs 4 (four) times daily as needed.    Yes Historical Provider, MD  polyethylene glycol (MIRALAX / GLYCOLAX) packet Take 17 g by mouth as needed.    Yes Historical Provider, MD  Potassium 75 MG TABS Take by mouth.    Yes Historical Provider, MD  Probiotic Product (ALIGN) 4 MG CAPS Take 4 mg by mouth daily.   Yes Historical Provider, MD  ranitidine (ZANTAC) 300 MG tablet Take 300 mg by mouth at bedtime.   Yes Historical Provider, MD  triamterene-hydrochlorothiazide (MAXZIDE) 75-50 MG per tablet Take 1 tablet by mouth every morning.    Yes Historical Provider, MD  vitamin B-12 (CYANOCOBALAMIN) 100 MCG tablet Take 50 mcg by mouth daily.    Yes Historical Provider, MD    Inpatient Medications:  . amphetamine-dextroamphetamine  10 mg Oral Daily  . buPROPion  150 mg Oral Daily  . docusate sodium  200 mg Oral QPM  . DULoxetine  20 mg Oral Daily  . famotidine  40 mg Oral QHS  . fluticasone  1 spray Each Nare Daily  . levothyroxine  112 mcg Oral QAC breakfast  . loratadine  10 mg Oral Daily  . mometasone-formoterol  2 puff Inhalation Daily  .  montelukast  10 mg Oral Daily  . pantoprazole  80 mg Oral Q1200  . sodium chloride  3 mL Intravenous Q12H  . triamterene-hydrochlorothiazide  1 tablet Oral q morning - 10a  . vancomycin  1,000 mg Intravenous On Call  . vitamin B-12  50 mcg Oral Daily     Allergies:  Allergies  Allergen Reactions  . Anesthetics, Amide     Patient unsure of names, however multiples cause swelling of airway  . Bactroban Other (See Comments)    Causes sores in nose  . Ciprofloxacin     REACTION: joint swelling  . Codeine Swelling  . Meperidine Hcl Nausea Only    Hallucinations   . Morphine Nausea Only    Hallucinations   . Neosporin [Neomycin-Polymyxin-Gramicidin] Dermatitis    All topical "orin's ointment"  . Nitrofurantoin     REACTION: neuropathy in legs  . Penicillins Other (See Comments)    Swelling in joints  . Sulfonamide Derivatives     REACTION: swelling, anaphylaxis  . Latex Rash    History   Social History  . Marital Status: Married    Spouse Name: N/A    Number of Children: 2  . Years of Education: N/A   Occupational History  . vp [employer]   .     Social History Main Topics  . Smoking status: Never Smoker   . Smokeless tobacco: Never Used  . Alcohol Use: 4.2 oz/week    7 Glasses of wine per week     Comment: nightly  . Drug Use: No  . Sexual Activity: Not on file   Other Topics Concern  . Not on file   Social History Narrative  . No narrative on file     Family History  Problem Relation Age of Onset  . Heart disease Father   . Diabetes      aunt  . Uterine cancer      grandmother  . Colon cancer Mother   . Depression Mother   .  Cancer Mother     colon  . Pancreatic cancer Sister   . Cancer Sister     pancreatic  . Cancer Maternal Grandmother     ovarian     ROS:  Please see the history of present illness.     All other systems reviewed and negative.    Physical Exam: Blood pressure 117/54, pulse 40, temperature 97.4  F (36.3 C), temperature source Oral, resp. rate 16, height 5' 6.5" (1.689 m), weight 139 lb 1.8 oz (63.1 kg), SpO2 100.00%. General: Well developed, well nourished female in no acute distress. Head: Normocephalic, atraumatic, sclera non-icteric, no xanthomas, nares are without discharge. EENT: normal Lymph Nodes:  none Back: without scoliosis/kyphosis, no CVA tendersness Neck: Negative for carotid bruits. JVD not elevated. Lungs: Clear bilaterally to auscultation without wheezes, rales, or rhonchi. Breathing is unlabored. Heart: RRR with S1 S2. No murmur , rubs, or gallops appreciated. Abdomen: Soft, non-tender, non-distended with normoactive bowel sounds. No hepatomegaly. No rebound/guarding. No obvious abdominal masses. Msk:  Strength and tone appear normal for age. Extremities: No clubbing or cyanosis. No  edema.  Distal pedal pulses are 2+ and equal bilaterally. Skin: Warm and Dry Neuro: Alert and oriented X 3. CN III-XII intact Grossly normal sensory and motor function . Psych:  Responds to questions appropriately with a normal affect.      Labs: Cardiac Enzymes  Recent Labs  12/26/12 2025 12/27/12 0841 12/27/12 1453 12/27/12 1825  TROPONINI <0.30 <0.30 <0.30 <0.30   CBC Lab Results  Component Value Date   WBC 6.6 12/29/2012   HGB 14.6 12/29/2012   HCT 41.7 12/29/2012   MCV 90.7 12/29/2012   PLT 195 12/29/2012   PROTIME:  Recent Labs  12/27/12 0839  LABPROT 13.0  INR 1.00   Chemistry  Recent Labs Lab 12/27/12 0839  12/29/12 0440  NA 135  < > 131*  K 3.9  < > 3.6  CL 104  < > 98  CO2 22  < > 22  BUN 8  < > 10  CREATININE 0.58  < > 0.65  CALCIUM 9.2  < > 9.2  PROT 5.6*  --   --   BILITOT 0.5  --   --   ALKPHOS 41  --   --   ALT 20  --   --   AST 19  --   --   GLUCOSE 90  < > 98  < > = values in this interval not displayed. Lipids No results found for this basename: CHOL, HDL, LDLCALC, TRIG   BNP Pro B Natriuretic peptide (BNP)  Date/Time Value  Range Status  12/26/2012  8:25 PM 763.0* 0 - 125 pg/mL Final   Miscellaneous Lab Results  Component Value Date   DDIMER  Value: <0.22        AT THE INHOUSE ESTABLISHED CUTOFF VALUE OF 0.48 ug/mL FEU, THIS ASSAY HAS BEEN DOCUMENTED IN THE LITERATURE TO HAVE A SENSITIVITY AND NEGATIVE PREDICTIVE VALUE OF AT LEAST 98 TO 99%.  THE TEST RESULT SHOULD BE CORRELATED WITH AN ASSESSMENT OF THE CLINICAL PROBABILITY OF DVT / VTE. 02/09/2009    Radiology/Studies:  Dg Chest 2 View  12/26/2012   *RADIOLOGY REPORT*  Clinical Data: 67 year old female with pain.  CHEST - 2 VIEW  Comparison: 08/31/2011 and earlier.  Findings: Upright AP and lateral views of the chest.  Postoperative changes to the right chest wall and axilla re-identified.  Slightly lower lung volumes.  Stable and negative cardiac mediastinal  contours.  No pneumothorax, pulmonary edema, pleural effusion or confluent pulmonary opacity. No acute osseous abnormality identified.  Cervical ACDF hardware partially visible.  IMPRESSION: No acute cardiopulmonary abnormality.   Original Report Authenticated By: Erskine Speed, M.D.    EKG: NSR with RBBB and LAD   Assessment and Plan:     Active Problems:   Second degree heart block   Hypokalemia   Hypothyroidism   Hyponatremia   The patient has underlying bifascicular block and intermittent high-grade symptomatic heart block without a reversible cause. This is an appropriate class I indication for pacing.  We have discussed the potential benefits and risks including but not limited to perforation infection lead dislodgment. She has need for MRI. We have discussed MRI conditionality We'll further discuss that in [place] MRI condition her pacemaker has been late to the Medtronic 5076-lead as opposed to 5086-lead, a  lead which has a higher rate perforation in the 5076-lead. They're presently trying to decide which lead they would prefer.  In addition we have to explore the cause of her heart block  although it occurs in the context of chronic bifascicular block. Still the exclusion of systemic disease such as Lyme disease and sarcoid are appropriate in his relatively young lady. Furthermore, we have dyspnea on exertion accompanied by chest pain. We'll need to exclude coronary disease    Sherryl Manges

## 2012-12-29 NOTE — Progress Notes (Signed)
Orthopedic Tech Progress Note Patient Details:  [name] November 02, 1945 [id number]  Ortho Devices Type of Ortho Device: Arm sling Ortho Device/Splint Location: LUE   Jennye Moccasin 12/29/2012, 5:04 PM

## 2012-12-29 NOTE — CV Procedure (Signed)
   Cardiac Catheterization Procedure Note  Name: [name] MRN: [id number] DOB: [birth year]  Procedure: Left Heart Cath, Selective Coronary Angiography, LV angiography  Indication: 67 yo WF with symptomatic AV block also complains of exertional dyspnea and dizzyness.   Procedural Details: The right wrist was prepped, draped, and anesthetized with 1% lidocaine. Using the modified Seldinger technique, a 5 French sheath was introduced into the right radial artery. 3 mg of verapamil was administered through the sheath, weight-based unfractionated heparin was administered intravenously. Standard Judkins catheters were used for selective coronary angiography and left ventriculography. Catheter exchanges were performed over an exchange length guidewire. There were no immediate procedural complications. A TR band was used for radial hemostasis at the completion of the procedure.  The patient was transferred to the post catheterization recovery area for further monitoring.  Procedural Findings: Hemodynamics: AO 124/45 mean 73 mm Hg LV 122/14 mm Hg  Coronary angiography: Coronary dominance: right  Left mainstem: Normal  Left anterior descending (LAD): Normal  Left circumflex (LCx): Normal  Right coronary artery (RCA): Normal  Left ventriculography: Left ventricular systolic function is normal, LVEF is estimated at 55-65%, there is no significant mitral regurgitation   Final Conclusions:   1. Normal coronary angiography 2. Normal LV function.  Recommendations: proceed with pacemaker placement.  Theron Arista [facility] 12/29/2012, 11:04 AM

## 2012-12-30 ENCOUNTER — Inpatient Hospital Stay (HOSPITAL_COMMUNITY): Payer: Medicare Other

## 2012-12-30 ENCOUNTER — Encounter (HOSPITAL_COMMUNITY)
Admission: EM | Disposition: A | Discharge status: Home / Self Care | Payer: Self-pay | Source: Home / Self Care | Attending: Internal Medicine

## 2012-12-30 DIAGNOSIS — R079 Chest pain, unspecified: Secondary | ICD-10-CM | POA: Diagnosis not present

## 2012-12-30 DIAGNOSIS — I5031 Acute diastolic (congestive) heart failure: Secondary | ICD-10-CM | POA: Diagnosis not present

## 2012-12-30 DIAGNOSIS — K222 Esophageal obstruction: Secondary | ICD-10-CM | POA: Diagnosis not present

## 2012-12-30 DIAGNOSIS — I441 Atrioventricular block, second degree: Secondary | ICD-10-CM | POA: Diagnosis not present

## 2012-12-30 DIAGNOSIS — I309 Acute pericarditis, unspecified: Secondary | ICD-10-CM | POA: Diagnosis not present

## 2012-12-30 DIAGNOSIS — T82190A Other mechanical complication of cardiac electrode, initial encounter: Secondary | ICD-10-CM

## 2012-12-30 DIAGNOSIS — I319 Disease of pericardium, unspecified: Secondary | ICD-10-CM

## 2012-12-30 HISTORY — PX: LEAD REVISION: SHX5945

## 2012-12-30 LAB — GLUCOSE, CAPILLARY

## 2012-12-30 SURGERY — LEAD REVISION
Anesthesia: LOCAL

## 2012-12-30 MED ORDER — MIDAZOLAM HCL 5 MG/5ML IJ SOLN
INTRAMUSCULAR | Status: AC
  Filled 2012-12-30: qty 5

## 2012-12-30 MED ORDER — VANCOMYCIN HCL IN DEXTROSE 1-5 GM/200ML-% IV SOLN
1000.0000 mg | INTRAVENOUS | Status: DC
  Filled 2012-12-30: qty 200

## 2012-12-30 MED ORDER — SODIUM CHLORIDE 0.9 % IR SOLN: 80.0000 mg | Status: DC

## 2012-12-30 MED ORDER — CHLORHEXIDINE GLUCONATE 4 % EX LIQD: 60.0000 mL | Freq: Once | CUTANEOUS | Status: DC

## 2012-12-30 MED ORDER — FENTANYL CITRATE 0.05 MG/ML IJ SOLN
INTRAMUSCULAR | Status: AC
  Filled 2012-12-30: qty 2

## 2012-12-30 MED ORDER — ONDANSETRON HCL 4 MG/2ML IJ SOLN
4.0000 mg | Freq: Four times a day (QID) | INTRAMUSCULAR | Status: DC | PRN
  Administered 2012-12-30 – 2012-12-31 (×2): 4 mg via INTRAVENOUS
  Filled 2012-12-30 (×2): qty 2

## 2012-12-30 MED ORDER — HEPARIN (PORCINE) IN NACL 2-0.9 UNIT/ML-% IJ SOLN
INTRAMUSCULAR | Status: AC
  Filled 2012-12-30: qty 1000

## 2012-12-30 MED ORDER — SODIUM CHLORIDE 0.9 % IV SOLN: INTRAVENOUS | Status: DC

## 2012-12-30 MED ORDER — LIDOCAINE HCL (PF) 1 % IJ SOLN
INTRAMUSCULAR | Status: AC
  Filled 2012-12-30: qty 60

## 2012-12-30 MED ORDER — ONDANSETRON HCL 4 MG/2ML IJ SOLN: 4.0000 mg | Freq: Four times a day (QID) | INTRAMUSCULAR | Status: DC | PRN

## 2012-12-30 MED ORDER — ACETAMINOPHEN 325 MG PO TABS: 325.0000 mg | ORAL_TABLET | ORAL | Status: DC | PRN

## 2012-12-30 MED ORDER — FENTANYL CITRATE 0.05 MG/ML IJ SOLN
25.0000 ug | INTRAMUSCULAR | Status: DC | PRN
  Administered 2012-12-30: 25 ug via INTRAVENOUS
  Filled 2012-12-30: qty 0.5

## 2012-12-30 MED ORDER — FENTANYL CITRATE 0.05 MG/ML IJ SOLN
25.0000 ug | INTRAMUSCULAR | Status: DC | PRN
  Administered 2012-12-30 (×3): 25 ug via INTRAVENOUS
  Filled 2012-12-30 (×3): qty 2

## 2012-12-30 MED ORDER — CHLORHEXIDINE GLUCONATE 4 % EX LIQD
60.0000 mL | Freq: Once | CUTANEOUS | Status: DC
Start: 2012-12-30 — End: 2012-12-30

## 2012-12-30 MED ORDER — VANCOMYCIN HCL IN DEXTROSE 1-5 GM/200ML-% IV SOLN
1000.0000 mg | Freq: Two times a day (BID) | INTRAVENOUS | Status: AC
  Administered 2012-12-30: 21:00:00 1000 mg via INTRAVENOUS
  Filled 2012-12-30: qty 200

## 2012-12-30 NOTE — Progress Notes (Signed)
Patient continues to have pain to chest with inspiratory  breathing upper mid sternal that radiates bilateral to neck and shoulders. This was followed with nausea and need to try for BM. Prn medications used with slight relief. Vitals stable and V paced on monitor, site WNL. Dr. Adolm Joseph aware and has been in to see patient twice tonight and continuing to follow closely.  Patient went for xray waiting results. Call bell within reach I will continue to monitor.

## 2012-12-30 NOTE — Progress Notes (Signed)
Reviewed am meds with patient and patient refused two meds charted as refused. She wanted to take her other morning meds after surgery and not on an empty stomach. Will give after surgery.

## 2012-12-30 NOTE — CV Procedure (Signed)
PPM system revision carried without immediate complication. [id number].

## 2012-12-30 NOTE — Progress Notes (Signed)
  Echocardiogram 2D Echocardiogram (limited) has been performed.  [name] 12/30/2012, 11:36 AM

## 2012-12-30 NOTE — Progress Notes (Signed)
Given synthroid to patient at 1552, patient complained of pain and given fentanyl at 1555. Refused meds at this time, stating she is not hungry and would like to wait.

## 2012-12-30 NOTE — Progress Notes (Signed)
Patient transferred from cath lab in bed with cath lab staff. Monitor on, vital signs taken, assessed resting quietly.

## 2012-12-30 NOTE — Progress Notes (Signed)
Patient refused meds at this time, states she feels nauseous. Given zofran and maalox/mylanta for nausea per patient request. Resting quietly.

## 2012-12-30 NOTE — Progress Notes (Signed)
Patient transported to cath lab with cath lab staff in bed.

## 2012-12-30 NOTE — Progress Notes (Signed)
Patient Name: [name]      SUBJECTIVE: Chest pain pleuritic through the night with some shortness of breath; woprse while sitting up  Past Medical History  Diagnosis Date  . Diverticulosis   . GERD (gastroesophageal reflux disease)   . Esophageal stricture   . Hemorrhoids   . Mallory - Weiss tear   . Interstitial cystitis   . ADHD (attention deficit hyperactivity disorder)   . Breast cancer     right side  . Arthritis   . Blood transfusion   . Thyroid disease   . Allergy     trees/pollen, mold, fungus, dust mites. Takes allergy shots  . Latex allergy, contact dermatitis   . Cataract   . Depression   . Hernia of abdominal wall     spigelian hernia RLQ  . H/O hiatal hernia   . Neuromuscular disorder     central nervous system neuropathy- seen per Dr Sandria Manly  . Asthma     allergist Dr Illene Labrador- monthly allergy injections  . Complication of anesthesia     reaction to some anesthetics/ 7/12 anesth record on chart- states prefers epidural  . PONV (postoperative nausea and vomiting)     pt needs scop patch  . Recurrent upper respiratory infection (URI) 1/13- to present    bronchitis following surgery- states improved but still with cough. OV with Clearance Dr [name] 09/06/11 on chart    Scheduled Meds:  Scheduled Meds: . amphetamine-dextroamphetamine  10 mg Oral Daily  . buPROPion  150 mg Oral Daily  . docusate sodium  200 mg Oral QPM  . DULoxetine  20 mg Oral Daily  . famotidine  40 mg Oral QHS  . fluticasone  1 spray Each Nare Daily  . gentamicin irrigation   Irrigation To Cath  . levothyroxine  112 mcg Oral QAC breakfast  . loratadine  10 mg Oral Daily  . mometasone-formoterol  2 puff Inhalation Daily  . montelukast  10 mg Oral Daily  . pantoprazole  80 mg Oral Q1200  . triamterene-hydrochlorothiazide  1 tablet Oral q morning - 10a  . vancomycin  1,000 mg Intravenous To Cath  . vitamin B-12  50 mcg Oral Daily   Continuous Infusions:   PHYSICAL  EXAM Filed Vitals:   12/29/12 1700 12/29/12 1800 12/30/12 0028 12/30/12 0330  BP: 134/54 152/61 121/60 107/81  Pulse: 61 76 70 73  Temp:  97.8 F (36.6 C) 97.8 F (36.6 C) 98.3 F (36.8 C)  TempSrc:  Oral Oral Oral  Resp:  18 17 20   Height:      Weight:    137 lb 2 oz (62.2 kg)  SpO2: 100% 99% 94% 96%    Well developed and nourished in no acute distress HENT normal Neck supple with JVP-flat Clear Picket without bleeding or hematoma Regular rate and rhythm, no murmurs or rubs Abd-soft with active BS No Clubbing cyanosis edema Skin-warm and dry A & Oriented  Grossly normal sensory and motor function  TELEMETRY: Reviewed telemetry pt i P-synchronous/ AV  pacing    Intake/Output Summary (Last 24 hours) at 12/30/12 0806 Last data filed at 12/30/12 0500  Gross per 24 hour  Intake    795 ml  Output    950 ml  Net   -155 ml    LABS: Basic Metabolic Panel:  Recent Labs Lab 12/26/12 2025 12/27/12 0839 12/28/12 0543 12/29/12 0440  NA 130* 135 135 131*  K 3.1* 3.9 3.8 3.6  CL 93* 104 103  98  CO2 24 22 22 22   GLUCOSE 88 90 105* 98  BUN 14 8 8 10   CREATININE 0.70 0.58 0.68 0.65  CALCIUM 9.6 9.2 9.2 9.2  MG 2.0 2.2 2.2  --    Cardiac Enzymes:  Recent Labs  12/27/12 0841 12/27/12 1453 12/27/12 1825  TROPONINI <0.30 <0.30 <0.30   CBC:  Recent Labs Lab 12/26/12 2025 12/27/12 0839 12/29/12 0440  WBC 5.9 5.4 6.6  NEUTROABS 3.0  --   --   HGB 14.4 13.4 14.6  HCT 40.3 36.5 41.7  MCV 89.6 88.6 90.7  PLT 239 190 195   PROTIME:  Recent Labs  12/27/12 0839  LABPROT 13.0  INR 1.00   Liver Function Tests:  Recent Labs  12/27/12 0839  AST 19  ALT 20  ALKPHOS 41  BILITOT 0.5  PROT 5.6*  ALBUMIN 3.5   No results found for this basename: LIPASE, AMYLASE,  in the last 72 hours BNP: BNP (last 3 results)  Recent Labs  12/26/12 2025  PROBNP 763.0*   Thyroid Function Tests:  Recent Labs  12/27/12 0839  TSH 6.360*   Anemia Panel: No  results found for this basename: VITAMINB12, FOLATE, FERRITIN, TIBC, IRON, RETICCTPCT,  in the last 72 hours   Device Interrogation: normal device function CXR - normal lead placement with a little retraction oof the atrial lead   ASSESSMENT AND PLAN:  Active Problems:   Second degree heart block   Hypokalemia   Hypothyroidism   Hyponatremia  Sus[ect microperforation given pleuritic chest pain, and prob RV lead Have reviewed with family that this is the most likely cause, and that strategry is preemptive, as it were, lead repositioning,  w Will write orders And discuss with dr Leonia Reeves  Signed, Sherryl Manges MD  12/30/2012

## 2012-12-30 NOTE — Progress Notes (Signed)
Reported to Sander Radon RN that am meds not given but should be given if patient able to take them this evening. Patient resting quietly at this time.

## 2012-12-31 ENCOUNTER — Inpatient Hospital Stay (HOSPITAL_COMMUNITY): Payer: Medicare Other

## 2012-12-31 DIAGNOSIS — I5031 Acute diastolic (congestive) heart failure: Secondary | ICD-10-CM | POA: Diagnosis not present

## 2012-12-31 DIAGNOSIS — I319 Disease of pericardium, unspecified: Secondary | ICD-10-CM

## 2012-12-31 DIAGNOSIS — Z95818 Presence of other cardiac implants and grafts: Secondary | ICD-10-CM | POA: Diagnosis not present

## 2012-12-31 DIAGNOSIS — K222 Esophageal obstruction: Secondary | ICD-10-CM | POA: Diagnosis not present

## 2012-12-31 DIAGNOSIS — R0989 Other specified symptoms and signs involving the circulatory and respiratory systems: Secondary | ICD-10-CM | POA: Diagnosis not present

## 2012-12-31 DIAGNOSIS — I309 Acute pericarditis, unspecified: Secondary | ICD-10-CM

## 2012-12-31 DIAGNOSIS — I441 Atrioventricular block, second degree: Secondary | ICD-10-CM | POA: Diagnosis not present

## 2012-12-31 LAB — BASIC METABOLIC PANEL
Calcium: 8.9 mg/dL (ref 8.4–10.5)
GFR calc Af Amer: >90 mL/min (ref >90)
GFR calc non Af Amer: >90 mL/min (ref >90)
Potassium: 3.9 mEq/L (ref 3.5–5.1)
Sodium: 132 mEq/L — ABNORMAL LOW (ref 135–145)

## 2012-12-31 LAB — MAGNESIUM: Magnesium: 2.2 mg/dL (ref 1.5–2.5)

## 2012-12-31 MED ORDER — POLYETHYLENE GLYCOL 3350 17 G PO PACK
17.0000 g | PACK | Freq: Every day | ORAL | Status: DC | PRN
  Administered 2012-12-31: 17 g via ORAL
  Filled 2012-12-31 (×2): qty 1

## 2012-12-31 MED ORDER — KETOROLAC TROMETHAMINE 15 MG/ML IJ SOLN
15.0000 mg | Freq: Once | INTRAMUSCULAR | Status: AC
  Administered 2012-12-31: 15 mg via INTRAVENOUS
  Filled 2012-12-31: qty 1

## 2012-12-31 MED ORDER — IBUPROFEN 400 MG PO TABS
400.0000 mg | ORAL_TABLET | Freq: Three times a day (TID) | ORAL | Status: DC
  Administered 2012-12-31: 400 mg via ORAL
  Filled 2012-12-31 (×3): qty 1

## 2012-12-31 MED ORDER — CHLORHEXIDINE GLUCONATE 4 % EX LIQD
CUTANEOUS | Status: AC
  Filled 2012-12-31: qty 15

## 2012-12-31 MED ORDER — SODIUM CHLORIDE 0.9 % IV SOLN
INTRAVENOUS | Status: DC
  Administered 2012-12-31 – 2013-01-01 (×3): via INTRAVENOUS

## 2012-12-31 MED ORDER — FENTANYL CITRATE 0.05 MG/ML IJ SOLN
25.0000 ug | INTRAMUSCULAR | Status: DC | PRN
  Administered 2012-12-31: 25 ug via INTRAVENOUS
  Filled 2012-12-31: qty 0.5

## 2012-12-31 MED ORDER — ACETAMINOPHEN 325 MG PO TABS
650.0000 mg | ORAL_TABLET | Freq: Two times a day (BID) | ORAL | Status: DC
  Administered 2012-12-31 – 2013-01-02 (×5): 650 mg via ORAL
  Filled 2012-12-31 (×5): qty 2

## 2012-12-31 MED ORDER — ONDANSETRON HCL 4 MG PO TABS: 4.0000 mg | ORAL_TABLET | Freq: Three times a day (TID) | ORAL | Status: DC | PRN

## 2012-12-31 MED ORDER — ZOLPIDEM TARTRATE 5 MG PO TABS
5.0000 mg | ORAL_TABLET | Freq: Every evening | ORAL | Status: DC | PRN
  Administered 2013-01-01 – 2013-01-02 (×3): 5 mg via ORAL
  Filled 2012-12-31 (×3): qty 1

## 2012-12-31 NOTE — Progress Notes (Addendum)
Have reviewed with Dr Karl Pock  He thinks fluid is also possibly less than yesterday as did DM, and does not think effusion needs yet to be drained  He will be by to formally consult We will transfer to ICU COntinue IV fluids D/c NSAIDs  The other issue raised by nursing is could this be related to adderrall withdrawal  Total time 1h 25 minutes

## 2012-12-31 NOTE — Progress Notes (Signed)
Have reviewed echo with Dr DM  ydays echo showed some degree of RV collapse and the echo today is more difficult in part because of the tachycardia.  The effusion is about the same; there is evidence of IVC dilitation.  transmitral inflows are uninterpretable w tachycardia; there is some evidence of RV collapse  i have reviewed with Pt and will discuss with TCTS re window  Will continue with hydration and plan transfer to the ICU

## 2012-12-31 NOTE — Progress Notes (Signed)
  Echocardiogram 2D Echocardiogram has been performed.  Georgian Co 12/31/2012, 11:57 AM

## 2012-12-31 NOTE — Progress Notes (Signed)
Pt with ongoing complaints of chest pain Echo >>mod effusion, not yet compared to yday IVC distension PE BP into 70-90 this afternoon; Pulsus 10-40mm No rub  Device interrogation>>sinus tach, not PMT  Am concerned re impending tamponade  Will begin IV fluids at 100dc hrr And to review echo with Dr DM

## 2012-12-31 NOTE — Op Note (Signed)
[name]          ACCOUNT NO.:  [id number]  MEDICAL RECORD NO.:  [id number]  LOCATION:  6C07C                        FACILITY:  [facility]  PHYSICIAN:  Doylene Canning. Courtney Ridgel, MD    DATE OF BIRTH:  27-Aug-1945  DATE OF PROCEDURE:  12/30/2012 DATE OF DISCHARGE:                              OPERATIVE REPORT   PROCEDURE PERFORMED:  Revision of an atrial and ventricular leads.  INDICATION:  Chest pain and pericardial effusion following additional insertion of a dual-chamber pacing system.  INTRODUCTION:  The patient is a 67 year old woman with a history of symptomatic bradycardia who underwent permanent pacemaker insertion. She was subsequently found to have severe chest pain postprocedure and 2D echo demonstrated a pericardial effusion.  She has a perforation most likely of her ventricular lead and she is now referred for repositioning of her previously implanted leads.  DESCRIPTION OF PROCEDURE:  After informed consent was obtained, the patient was taken to the diagnostic EP lab in a fasting state.  After usual preparation and draping, intravenous fentanyl and midazolam were given for sedation.  A 30 mL of lidocaine was infiltrated into the left infraclavicular region.  A 5-cm incision was carried out over this region.  Electrocautery was utilized to dissect down to the pacemaker pocket.  The generator was removed with gentle traction and the leads were freed up from their silk suture.  The ventricular lead and atrial lead were both retracted.  The helix was retracted back into the body of the leads.  At this point, mapping was re-carried out in the right ventricular lead.  At the final site, the R-waves were 7 to 8, pacing threshold was less than a V at 0.5 milliseconds and the pacing impedance was 1200 ohms.  There was a large current of injury with active fixation of the lead and 10 V pacing did not stimulate the diaphragm.  With the ventricular lead in satisfactory  position, attention was then turned to the placement of the atrial lead which was placed in the anterolateral portion of the right atrium where the P-waves measured 3 mV and the pacing impedance was 580 ohms.  Threshold was 0.5 V at 0.5 milliseconds and again 10 V pacing did not stimulate the diaphragm.  Again, there was a large injury current with active fixation of the lead.  With these satisfactory locations, the leads were secured to the subpectoral fascia with a figure-of-eight silk suture.  Sewing sleeve was secured with silk suture.  Electrocautery was utilized to make subcutaneous pocket. Antibiotic irrigation was utilized to irrigate the pocket. Electrocautery was utilized to assure hemostasis.  The Medtronic dual- chamber pacemaker was reconnected to the atrial and ventricular leads and placed back into the subcutaneous pocket.  The pocket was irrigated with antibiotic irrigation.  The incision was closed with 2-0 and 3-0 Vicryl.  Benzoin and Steri-Strips were painted on the skin, a pressure dressing was applied, and the patient was returned to her room in satisfactory condition.  COMPLICATIONS:  There were no immediate procedure complications.  RESULTS:  Demonstrate successful revision of a previous implanted atrial and ventricular pacing leads with the initial procedure complicated by chest pain and pericardial effusion, indicative of perforation of most likely  the right ventricular lead.     Doylene Canning. Courtney Ridgel, MD     GWT/MEDQ  D:  12/30/2012  T:  12/31/2012  Job:  [id number]

## 2012-12-31 NOTE — Progress Notes (Signed)
Patient ID: [name], female   DOB: November 04, 1945, 67 y.o.   MRN: [id number]      [address]       [place] [address]             [phone]        JIM PHILEMON [facility] Medical Record #[id number] Date of Birth: 04/07/1946  Referring: Dr Graciela Husbands and Myrtis Ser  Primary Care: Minda Meo, MD  Chief Complaint:    Chief Complaint  Patient presents with  . Shortness of Breath  . Near Syncope    History of Present Illness:     Asked to see patient two days after pacer placement. Now with chest pain. She is able lay flat with out sob. Serial echos have been done and reviewed with Dr Graciela Husbands    Current Activity/ Functional Status: Patient is independent with mobility/ambulation, transfers, ADL's, IADL's.   Zubrod Score: At the time of surgery this patient's most appropriate activity status/level should be described as: []  Normal activity, no symptoms [x]  Symptoms, fully ambulatory []  Symptoms, in bed less than or equal to 50% of the time []  Symptoms, in bed greater than 50% of the time but less than 100% []  Bedridden []  Moribund  Past Medical History  Diagnosis Date  . Diverticulosis   . GERD (gastroesophageal reflux disease)   . Esophageal stricture   . Hemorrhoids   . Mallory - Weiss tear   . Interstitial cystitis   . ADHD (attention deficit hyperactivity disorder)   . Breast cancer     right side  . Arthritis   . Blood transfusion   . Thyroid disease   . Allergy     trees/pollen, mold, fungus, dust mites. Takes allergy shots  . Latex allergy, contact dermatitis   . Cataract   . Depression   . Hernia of abdominal wall     spigelian hernia RLQ  . H/O hiatal hernia   . Neuromuscular disorder     central nervous system neuropathy- seen per Dr Sandria Manly  . Asthma     allergist Dr Illene Labrador- monthly allergy injections  . Complication of anesthesia     reaction to some anesthetics/ 7/12 anesth record on chart- states prefers epidural  .  PONV (postoperative nausea and vomiting)     pt needs scop patch  . Recurrent upper respiratory infection (URI) 1/13- to present    bronchitis following surgery- states improved but still with cough. OV with [facility] Dr [name] 09/06/11 on chart    Past Surgical History  Procedure Laterality Date  . Mastectomy modified radical      right; with immediate reconstruction  . Laparoscopy  1973  . Abdominal hysterectomy  1974  . Cystoscopy  1975, 2007  . Myringoplasty  1962  . Tympanoplasty  1973    right  . Oophorectomy  1982  . Bladder suspension    . Rectocele repair      with cystocele repair  . Total hip arthroplasty    . Ventral hernia repair  05/30/2011    Procedure: HERNIA REPAIR VENTRAL ADULT;  Surgeon: Currie Paris, MD;  Location: [facility];  Service: General;  Laterality: Right;  repair right spigelian hernia  . Breast biopsy  2002    NO BLOOD PRESSURES ON RIGHT SIDE/   s/p  axillary node dissection  . Tonsillectomy    . Back surgery      cervical fusion 4-5 with plate  . Brain surgery  bilateral lasik  . Cysto with hydrodistension  09/07/2011    Procedure: CYSTOSCOPY/HYDRODISTENSION;  Surgeon: Kathi Ludwig, MD;  Location: [facility];  Service: Urology;  Laterality: N/A;  INSTILLATION OF MARCAINE/PYRIDIUM INSTILLATION OF MARCAINE/KENALOG    History  Smoking status  . Never Smoker   Smokeless tobacco  . Never Used    History  Alcohol Use  . 4.2 oz/week  . 7 Glasses of wine per week    Comment: nightly    History   Social History  . Marital Status: Married    Spouse Name: N/A    Number of Children: 2  . Years of Education: N/A   Occupational History  . vp [employer]   .     Social History Main Topics  . Smoking status: Never Smoker   . Smokeless tobacco: Never Used  . Alcohol Use: 4.2 oz/week    7 Glasses of wine per week     Comment: nightly  . Drug Use: No  . Sexual Activity: Not on file      Allergies  Allergen Reactions  . Anesthetics, Amide     Patient unsure of names, however multiples cause swelling of airway  . Bactroban Other (See Comments)    Causes sores in nose  . Ciprofloxacin     REACTION: joint swelling  . Codeine Swelling  . Meperidine Hcl Nausea Only    Hallucinations   . Morphine Nausea Only    Hallucinations   . Neosporin [Neomycin-Polymyxin-Gramicidin] Dermatitis    All topical "orin's ointment"  . Nitrofurantoin     REACTION: neuropathy in legs  . Penicillins Other (See Comments)    Swelling in joints  . Sulfonamide Derivatives     REACTION: swelling, anaphylaxis  . Latex Rash    Current Facility-Administered Medications  Medication Dose Route Frequency Provider Last Rate Last Dose  . 0.9 %  sodium chloride infusion   Intravenous Continuous Duke Salvia, MD 100 mL/hr at 12/31/12 1800    . albuterol (PROVENTIL HFA;VENTOLIN HFA) 108 (90 BASE) MCG/ACT inhaler 2 puff  2 puff Inhalation Q6H PRN Quintella Reichert, MD      . alum & mag hydroxide-simeth (MAALOX/MYLANTA) 200-200-20 MG/5ML suspension 30 mL  30 mL Oral Q6H PRN Ardis Rowan, MD   30 mL at 12/30/12 1739  . amphetamine-dextroamphetamine (ADDERALL) tablet 10 mg  10 mg Oral Daily Quintella Reichert, MD   10 mg at 12/31/12 1159  . buPROPion (WELLBUTRIN SR) 12 hr tablet 150 mg  150 mg Oral Daily Quintella Reichert, MD   150 mg at 12/31/12 0941  . clonazePAM (KLONOPIN) tablet 1 mg  1 mg Oral TID PRN Quintella Reichert, MD   1 mg at 12/30/12 0308  . docusate sodium (COLACE) capsule 200 mg  200 mg Oral QPM Quintella Reichert, MD   200 mg at 12/31/12 0943  . DULoxetine (CYMBALTA) DR capsule 20 mg  20 mg Oral Daily Quintella Reichert, MD   20 mg at 12/31/12 0941  . famotidine (PEPCID) tablet 40 mg  40 mg Oral QHS Quintella Reichert, MD   40 mg at 12/30/12 2136  . fentaNYL (SUBLIMAZE) injection 25 mcg  25 mcg Intravenous Q1H PRN Duke Salvia, MD   25 mcg at 12/31/12 0136  . fluticasone (FLONASE) 50 MCG/ACT nasal  spray 1 spray  1 spray Each Nare Daily Quintella Reichert, MD   1 spray at 12/28/12 0930  . levothyroxine (SYNTHROID, LEVOTHROID)  tablet 112 mcg  112 mcg Oral QAC breakfast Quintella Reichert, MD   112 mcg at 12/31/12 0940  . loratadine (CLARITIN) tablet 10 mg  10 mg Oral Daily Quintella Reichert, MD   10 mg at 12/31/12 0941  . mometasone-formoterol (DULERA) 100-5 MCG/ACT inhaler 2 puff  2 puff Inhalation Daily Quintella Reichert, MD   2 puff at 12/31/12 [phone]  . montelukast (SINGULAIR) tablet 10 mg  10 mg Oral Daily Quintella Reichert, MD   10 mg at 12/31/12 0941  . nitroGLYCERIN (NITROSTAT) SL tablet 0.4 mg  0.4 mg Sublingual Q5 Min x 3 PRN Quintella Reichert, MD      . ondansetron (ZOFRAN) injection 4 mg  4 mg Intravenous Q6H PRN Dayna N Dunn, PA-C   4 mg at 12/31/12 0947  . ondansetron (ZOFRAN) tablet 4 mg  4 mg Oral Q8H PRN Ok Anis, NP      . pantoprazole (PROTONIX) EC tablet 80 mg  80 mg Oral Q1200 Quintella Reichert, MD   80 mg at 12/31/12 1200  . polyethylene glycol (MIRALAX / GLYCOLAX) packet 17 g  17 g Oral Daily PRN Dayna N Dunn, PA-C   17 g at 12/31/12 0943  . triamterene-hydrochlorothiazide (MAXZIDE) 75-50 MG per tablet 1 tablet  1 tablet Oral q morning - 10a Quintella Reichert, MD   1 tablet at 12/31/12 0942  . vitamin B-12 (CYANOCOBALAMIN) tablet 50 mcg  50 mcg Oral Daily Quintella Reichert, MD   50 mcg at 12/31/12 5409   Facility-Administered Medications Ordered in Other Encounters  Medication Dose Route Frequency Provider Last Rate Last Dose  . bupivacaine (MARCAINE) 0.5 % 10 mL, triamcinolone acetonide (KENALOG-40) 40 mg injection   Subcutaneous Once Kathi Ludwig, MD      . bupivacaine (MARCAINE) 0.5 % 15 mL, phenazopyridine (PYRIDIUM) 400 mg bladder mixture   Bladder Instillation Once Kathi Ludwig, MD        Prescriptions prior to admission  Medication Sig Dispense Refill  . albuterol (PROVENTIL HFA;VENTOLIN HFA) 108 (90 BASE) MCG/ACT inhaler Inhale 2 puffs into the lungs every 6 (six)  hours as needed. For shortness of breath      . amphetamine-dextroamphetamine (ADDERALL) 10 MG tablet Take 10 mg by mouth daily.       . Ascorbic Acid (VITAMIN C) 100 MG tablet Take 100 mg by mouth daily.       Marland Kitchen b complex vitamins capsule Take 1 capsule by mouth daily.       Marland Kitchen buPROPion (WELLBUTRIN SR) 150 MG 12 hr tablet Take 150 mg by mouth daily.       . calcium carbonate (TUMS - DOSED IN MG ELEMENTAL CALCIUM) 500 MG chewable tablet Chew 1 tablet by mouth as needed.       . Calcium Carbonate-Vitamin D (CALTRATE 600+D) 600-400 MG-UNIT per chew tablet Chew 1 tablet by mouth daily.       . cholecalciferol (VITAMIN D) 1000 UNITS tablet Take 1,000 Units by mouth daily.      . clonazePAM (KLONOPIN) 1 MG tablet Take 1 mg by mouth 3 (three) times daily as needed.       . docusate sodium (COLACE) 100 MG capsule Take 200 mg by mouth every evening.       . DULoxetine (CYMBALTA) 20 MG capsule Take 20 mg by mouth daily.      Marland Kitchen esomeprazole (NEXIUM) 40 MG capsule Take 40 mg by mouth daily before breakfast.       .  estradiol (ESTRING) 2 MG vaginal ring Place 2 mg vaginally every 3 (three) months. follow package directions      . estradiol (VIVELLE-DOT) 0.025 MG/24HR Place 1 patch onto the skin 2 (two) times a week.       . fexofenadine (ALLEGRA) 180 MG tablet Take 180 mg by mouth daily.       . fish oil-omega-3 fatty acids 1000 MG capsule Take 2 g by mouth daily.      . Homeopathic Products (ZICAM ALLERGY RELIEF NA) Place 1 spray into the nose 2 (two) times daily as needed. For allergy relief      . levothyroxine (SYNTHROID, LEVOTHROID) 112 MCG tablet Take 112 mcg by mouth every morning.       . magnesium 30 MG tablet Take 30 mg by mouth daily.       . mometasone (ASMANEX) 220 MCG/INH inhaler Inhale 2 puffs into the lungs daily.      . mometasone (NASONEX) 50 MCG/ACT nasal spray Place 2 sprays into the nose daily.      . mometasone-formoterol (DULERA) 100-5 MCG/ACT AERO Inhale 2 puffs into the lungs.       . montelukast (SINGULAIR) 10 MG tablet Take 10 mg by mouth daily.      Gracelyn Nurse Leaf 250 MG CAPS Take 1 capsule by mouth.      . pentosan polysulfate (ELMIRON) 100 MG capsule Take 100 mg by mouth 3 (three) times daily.      . pirbuterol (MAXAIR) 200 MCG/INH inhaler Inhale 2 puffs into the lungs 4 (four) times daily as needed.       . polyethylene glycol (MIRALAX / GLYCOLAX) packet Take 17 g by mouth as needed.       . Potassium 75 MG TABS Take by mouth.       . Probiotic Product (ALIGN) 4 MG CAPS Take 4 mg by mouth daily.      . ranitidine (ZANTAC) 300 MG tablet Take 300 mg by mouth at bedtime.      . triamterene-hydrochlorothiazide (MAXZIDE) 75-50 MG per tablet Take 1 tablet by mouth every morning.       . vitamin B-12 (CYANOCOBALAMIN) 100 MCG tablet Take 50 mcg by mouth daily.         Family History  Problem Relation Age of Onset  . Heart disease Father   . Diabetes      aunt  . Uterine cancer      grandmother  . Colon cancer Mother   . Depression Mother   . Cancer Mother     colon  . Pancreatic cancer Sister   . Cancer Sister     pancreatic  . Cancer Maternal Grandmother     ovarian     Review of Systems:     Cardiac Review of Systems: Y or N  Chest Pain [ y   ]  Resting SOB [n   ] Exertional SOB  [ n ]  Orthopnea [n  ]   Pedal Edema [ n  ]    Palpitations [ n ] Syncope  [n  ]   Presyncope [  n ]  General Review of Systems: [Y] = yes [  ]=no Constitional: recent weight change [  ]; anorexia [  ]; fatigue [  ]; nausea [  ]; night sweats [  ]; fever [  ]; or chills [  ]  Dental: poor dentition[  y]; Last Dentist visit:   Eye : blurred vision [  ]; diplopia [   ]; vision changes [  ];  Amaurosis fugax[  ]; Resp: cough [  ];  wheezing[  ];  hemoptysis[  ]; shortness of breath[ y ]; paroxysmal nocturnal dyspnea[ n ]; dyspnea on exertion[y  ]; or orthopnea[  ];  GI:  gallstones[  ], vomiting[  ];  dysphagia[  ];  melena[  ];  hematochezia [  ]; heartburn[  ];   Hx of  Colonoscopy[  ]; GU: kidney stones [  ]; hematuria[  ];   dysuria [  ];  nocturia[  ];  history of     obstruction [  ]; urinary frequency [  ]             Skin: rash, swelling[  ];, hair loss[  ];  peripheral edema[  ];  or itching[  ]; Musculosketetal: myalgias[  ];  joint swelling[  ];  joint erythema[  ];  joint pain[  ];  back pain[  ];  Heme/Lymph: bruising[  ];  bleeding[  ];  anemia[  ];  Neuro: TIA[  ];  headaches[ n ];  stroke[ n ];  vertigo[  ];  seizures[ n ];   paresthesias[  ];  difficulty walking[  ];  Psych:depression[ y ]; anxiety[ y ];  Endocrine: diabetes[  ];  thyroid dysfunction[  ];  Immunizations: Flu [  ]; Pneumococcal[  ];  Other:  Physical Exam: BP 116/58  Pulse 86  Temp(Src) 98.1 F (36.7 C) (Oral)  Resp 17  Ht 5' 6.5" (1.689 m)  Wt 138 lb 3.7 oz (62.7 kg)  BMI 21.98 kg/m2  SpO2 98%  General appearance: alert and cooperative Neurologic: intact Heart: regular rate and rhythm, S1, S2 normal, no murmur, click, rub or gallop Lungs: clear to auscultation bilaterally and normal percussion bilaterally Abdomen: soft, non-tender; bowel sounds normal; no masses,  no organomegaly Extremities: extremities normal, atraumatic, no cyanosis or edema and Homans sign is negative, no sign of DVT Wound: dressing on left subclavian vein, brusing rt arm from cath Patient  Flat in bed with out distress,   Diagnostic Studies & Laboratory data:     Recent Radiology Findings:   Dg Chest 2 View  12/31/2012   *RADIOLOGY REPORT*  Clinical Data: Status post pacemaker lead revision.  CHEST - 2 VIEW  Comparison: 12/30/2012.  Findings: Normal heart size with clear lung fields.  No bony abnormality.  Dual lead permanent transvenous pacer from left subclavian approach appears satisfactory. No pneumothorax.  Right axillary node dissection. Right breast implant.  No osseous findings.   Prior cervical fusion.  IMPRESSION: No active  cardiopulmonary disease. Stable appearance from priors.   Original Report Authenticated By: Davonna Belling, M.D.   Dg Chest 2 View  12/30/2012   *RADIOLOGY REPORT*  Clinical Data: Continued pleuritic pain after implant.  CHEST - 2 VIEW  Comparison: 12/29/2012  Findings: Stable appearance of cardiac pacemaker.  Postoperative changes in the cervical spine and right axilla.  Normal heart size and pulmonary vascularity.  No focal consolidation or airspace disease.  No pleural thickening.  No pneumothorax.  Mild degenerative changes in the spine.  No significant change since previous study.  IMPRESSION: No evidence of active pulmonary disease.   Original Report Authenticated By: Burman Nieves, M.D.   Dg Chest Port 1 View  12/29/2012   *RADIOLOGY REPORT*  Clinical Data: Pain when breathing post pacer.  PORTABLE CHEST - 1 VIEW  Comparison: 12/26/2012  Findings: Interval placement of cardiac pacemaker with lead tips over the RA and RV regions.  No pneumothorax.  Normal heart size and pulmonary vascularity.  No focal consolidation or airspace disease in the lungs.  No blunting of costophrenic angles.  No pneumothorax.  Mediastinal contours appear intact.  Postoperative changes in the right axilla. Postoperative changes in the cervical spine.  IMPRESSION: Interval placement cardiac pacemaker.  No evidence of active pulmonary disease.  No pneumothorax.   Original Report Authenticated By: Burman Nieves, M.D.      Recent Lab Findings: Lab Results  Component Value Date   WBC 6.6 12/29/2012   HGB 14.6 12/29/2012   HCT 41.7 12/29/2012   PLT 195 12/29/2012   GLUCOSE 121* 12/31/2012   ALT 20 12/27/2012   AST 19 12/27/2012   NA 132* 12/31/2012   K 3.9 12/31/2012   CL 98 12/31/2012   CREATININE 0.56 12/31/2012   BUN 13 12/31/2012   CO2 23 12/31/2012   TSH 6.360* 12/27/2012   INR 1.00 12/27/2012      Assessment / Plan:   Pericarditis related to pacer - review of echo - small effusion present likely less then yesterday  without clinical tamponade currently Avoid anticoagulation  Hypothyroidism with elevated TSH  Do not recommend pericardial window or sternotomy at this point Npo p mn and follow up echo in am       Delight Ovens MD      [address] [place] [place] [address] Office [phone]   Beeper [phone]  12/31/2012 8:33 PM

## 2012-12-31 NOTE — Progress Notes (Signed)
Patient had aflutter on the monitor at 11:03 am vitals stable and patient continues to feels bad. Marvis Repress PA notified and strip sent to him he is following up with Dr. Graciela Husbands. Patient also has suddenly stopped adderall on this admission after taking for years today is day 3 I discussed with patient that the recommendations is not to stop "cold [place]" since she has been on it for years and to be weaned down because this could have side effects she willing took dose for today and will talk with her MD about stopping. Overall she continues to feel bad with multiple problems fatigue,loss of appetite, nausea, sweating, and pain. Husband at bedside for support call bell within reach. I will continue to monitor patient.

## 2012-12-31 NOTE — Progress Notes (Signed)
Patient Name: [name]      SUBJECTIVE:she continues to have problems with pain which is worse with sitting up. It  is somewhat improved compared to yesterday. It radiates into her ears. Seems to be worse with sitting up. She also has complaints of nausea sweating. She has been given narcotics for this.  Past Medical History  Diagnosis Date  . Diverticulosis   . GERD (gastroesophageal reflux disease)   . Esophageal stricture   . Hemorrhoids   . Mallory - Weiss tear   . Interstitial cystitis   . ADHD (attention deficit hyperactivity disorder)   . Breast cancer     right side  . Arthritis   . Blood transfusion   . Thyroid disease   . Allergy     trees/pollen, mold, fungus, dust mites. Takes allergy shots  . Latex allergy, contact dermatitis   . Cataract   . Depression   . Hernia of abdominal wall     spigelian hernia RLQ  . H/O hiatal hernia   . Neuromuscular disorder     central nervous system neuropathy- seen per Dr Sandria Manly  . Asthma     allergist Dr Illene Labrador- monthly allergy injections  . Complication of anesthesia     reaction to some anesthetics/ 7/12 anesth record on chart- states prefers epidural  . PONV (postoperative nausea and vomiting)     pt needs scop patch  . Recurrent upper respiratory infection (URI) 1/13- to present    bronchitis following surgery- states improved but still with cough. OV with Clearance Dr [name] 09/06/11 on chart    Scheduled Meds:  Scheduled Meds: . amphetamine-dextroamphetamine  10 mg Oral Daily  . buPROPion  150 mg Oral Daily  . docusate sodium  200 mg Oral QPM  . DULoxetine  20 mg Oral Daily  . famotidine  40 mg Oral QHS  . fluticasone  1 spray Each Nare Daily  . levothyroxine  112 mcg Oral QAC breakfast  . loratadine  10 mg Oral Daily  . mometasone-formoterol  2 puff Inhalation Daily  . montelukast  10 mg Oral Daily  . pantoprazole  80 mg Oral Q1200  . triamterene-hydrochlorothiazide  1 tablet Oral q morning - 10a  .  vitamin B-12  50 mcg Oral Daily   Continuous Infusions:   PHYSICAL EXAM Filed Vitals:   12/30/12 1930 12/30/12 2000 12/31/12 0032 12/31/12 0638  BP: 119/98 107/53 100/77 131/65  Pulse:  93 93 91  Temp:  97.2 F (36.2 C) 98.2 F (36.8 C) 97.5 F (36.4 C)  TempSrc:  Oral Oral Oral  Resp:  16 16 18   Height:      Weight:   138 lb 3.7 oz (62.7 kg)   SpO2: 100% 100% 100% 99%   Well developed and nourished in no acute distress HENT normal Neck supple with JVP-flat Clear Regular rate and rhythm, no murmurs or gallops no rub Abd-soft with active BS No Clubbing cyanosis edema Skin-warm and dry A & Oriented  Grossly normal sensory and motor function   TELEMETRY: Reviewed telemetry pt in *sinus tach w P-synchronous/ AV  pacing     Intake/Output Summary (Last 24 hours) at 12/31/12 0850 Last data filed at 12/31/12 0700  Gross per 24 hour  Intake    120 ml  Output   1350 ml  Net  -1230 ml    LABS: Basic Metabolic Panel:  Recent Labs Lab 12/26/12 2025 12/27/12 0839 12/28/12 0543 12/29/12 0440  NA 130* 135 135  131*  K 3.1* 3.9 3.8 3.6  CL 93* 104 103 98  CO2 24 22 22 22   GLUCOSE 88 90 105* 98  BUN 14 8 8 10   CREATININE 0.70 0.58 0.68 0.65  CALCIUM 9.6 9.2 9.2 9.2  MG 2.0 2.2 2.2  --    Cardiac Enzymes: No results found for this basename: CKTOTAL, CKMB, CKMBINDEX, TROPONINI,  in the last 72 hours CBC:  Recent Labs Lab 12/26/12 2025 12/27/12 0839 12/29/12 0440  WBC 5.9 5.4 6.6  NEUTROABS 3.0  --   --   HGB 14.4 13.4 14.6  HCT 40.3 36.5 41.7  MCV 89.6 88.6 90.7  PLT 239 190 195   PROTIME: No results found for this basename: LABPROT, INR,  in the last 72 hours Liver Function Tests: No results found for this basename: AST, ALT, ALKPHOS, BILITOT, PROT, ALBUMIN,  in the last 72 hours No results found for this basename: LIPASE, AMYLASE,  in the last 72 hours BNP: BNP (last 3 results)  Recent Labs  12/26/12 2025  PROBNP 763.0*    Device Interrogation  normal device fnction  ASSESSMENT AND PLAN:  Active Problems:   Second degree heart block   Hypokalemia   Hypothyroidism   Hyponatremia  Some residual pain I am not sure whether this is related to be perforation of pericardial irritation from the procedure. We'll repeat the echo today.  I will use low-dose NSAIDs. Instead of narcotics which may be contributing to the second problem i.e. Nausea vomiting.  Bowel sounds are present. I don't think the issue of obstruction  CXR not suggesting air  Will use zofran and hopefully she can eat  Sinus tach presumed 2/2 pain,  She says volume intake good; Bun/Cr ok    Signed, Sherryl Manges MD  12/31/2012

## 2013-01-01 ENCOUNTER — Inpatient Hospital Stay (HOSPITAL_COMMUNITY): Payer: Medicare Other

## 2013-01-01 DIAGNOSIS — I309 Acute pericarditis, unspecified: Secondary | ICD-10-CM

## 2013-01-01 DIAGNOSIS — I319 Disease of pericardium, unspecified: Secondary | ICD-10-CM

## 2013-01-01 DIAGNOSIS — R000 Tachycardia, unspecified: Secondary | ICD-10-CM

## 2013-01-01 DIAGNOSIS — Z950 Presence of cardiac pacemaker: Secondary | ICD-10-CM | POA: Diagnosis not present

## 2013-01-01 NOTE — Progress Notes (Signed)
Feeling better Hemodynamics now better with hypertension?? Cause Plan will be discharge in am Husband to come by about noon from work Will need outpt eval for heart block which I can arrange Have her followup with me in about 6 weeks  For chest CT>>hilar adenopathy, thallium scan>>infiltrative process

## 2013-01-01 NOTE — Progress Notes (Signed)
*  PRELIMINARY RESULTS* Echocardiogram 2D Echocardiogram has been performed.  [name] 01/01/2013, 10:22 AM

## 2013-01-01 NOTE — Progress Notes (Signed)
Patient ID: [name], female   DOB: Aug 22, 1945, 67 y.o.   MRN: [id number] [facility] DAILY ICU PROGRESS NOTE                   [address]            [name] [id number]          [phone]   2 Days Post-Op Procedure(s) (LRB): LEAD REVISION (N/A)  Total Length of Stay:  LOS: 6 days   Subjective: Feels better today, less chest pain not SOB  Objective: Vital signs in last 24 hours: Temp:  [97.4 F (36.3 C)-98.4 F (36.9 C)] 97.8 F (36.6 C) (08/21 1118) Pulse Rate:  [62-88] 81 (08/21 1800) Cardiac Rhythm:  [-] Ventricular paced (08/21 0800) Resp:  [10-26] 18 (08/21 1800) BP: (91-176)/(38-102) 176/61 mmHg (08/21 1800) SpO2:  [93 %-100 %] 100 % (08/21 1800)  Filed Weights   12/29/12 0600 12/30/12 0330 12/31/12 0032  Weight: 139 lb 1.8 oz (63.1 kg) 137 lb 2 oz (62.2 kg) 138 lb 3.7 oz (62.7 kg)    Weight change:    Hemodynamic parameters for last 24 hours:    Intake/Output from previous day: 08/20 0701 - 08/21 0700 In: 1876.7 [P.O.:360; I.V.:1516.7] Out: 1475 [Urine:1475]  Intake/Output this shift: Total I/O In: 850 [P.O.:250; I.V.:600] Out: 850 [Urine:850]  Current Meds: Scheduled Meds: . acetaminophen  650 mg Oral BID  . amphetamine-dextroamphetamine  10 mg Oral Daily  . buPROPion  150 mg Oral Daily  . docusate sodium  200 mg Oral QPM  . DULoxetine  20 mg Oral Daily  . famotidine  40 mg Oral QHS  . fluticasone  1 spray Each Nare Daily  . levothyroxine  112 mcg Oral QAC breakfast  . loratadine  10 mg Oral Daily  . mometasone-formoterol  2 puff Inhalation Daily  . montelukast  10 mg Oral Daily  . pantoprazole  80 mg Oral Q1200  . triamterene-hydrochlorothiazide  1 tablet Oral q morning - 10a  . vitamin B-12  50 mcg Oral Daily   Continuous Infusions: . sodium chloride 100 mL/hr at 01/01/13 1213   PRN Meds:.albuterol, alum & mag hydroxide-simeth, clonazePAM, fentaNYL, nitroGLYCERIN, ondansetron (ZOFRAN) IV, ondansetron, polyethylene  glycol, zolpidem  General appearance: alert and cooperative Neurologic: intact Heart: regular rate and rhythm, S1, S2 normal, no murmur, click, rub or gallop Lungs: diminished breath sounds bibasilar Abdomen: soft, non-tender; bowel sounds normal; no masses,  no organomegaly Extremities: extremities normal, atraumatic, no cyanosis or edema and Homans sign is negative, no sign of DVT  Lab Results: CBC:No results found for this basename: WBC, HGB, HCT, PLT,  in the last 72 hours BMET:  Recent Labs  12/31/12 1204  NA 132*  K 3.9  CL 98  CO2 23  GLUCOSE 121*  BUN 13  CREATININE 0.56  CALCIUM 8.9    PT/INR: No results found for this basename: LABPROT, INR,  in the last 72 hours Radiology: Dg Chest 2 View  12/31/2012   *RADIOLOGY REPORT*  Clinical Data: Status post pacemaker lead revision.  CHEST - 2 VIEW  Comparison: 12/30/2012.  Findings: Normal heart size with clear lung fields.  No bony abnormality.  Dual lead permanent transvenous pacer from left subclavian approach appears satisfactory. No pneumothorax.  Right axillary node dissection. Right breast implant.  No osseous findings.   Prior cervical fusion.  IMPRESSION: No active cardiopulmonary disease. Stable appearance from priors.   Original Report Authenticated By: [name], M.D.  Dg Chest Port 1 View  01/01/2013   *RADIOLOGY REPORT*  Clinical Data: Atrial lead dislodgement  PORTABLE CHEST - 1 VIEW  Comparison: 12/31/2012  Findings: Heart size and vascular pattern are normal.  Lungs are clear.  Two lead cardiac pacer again identified with generator over the left thorax.  Position of leads unchanged when compared to prior study.  IMPRESSION: No acute abnormalities.   Original Report Authenticated By: [name], M.D.     Assessment/Plan: S/P Procedure(s) (LRB): LEAD REVISION (N/A) Stable hemodynamics. No progression on ECHO No need for surgical intervention at this point     [name] B 01/01/2013 6:51  PM

## 2013-01-01 NOTE — Progress Notes (Signed)
CXR stable lead position ECho no RV collapse; IVC collapses with inspiration

## 2013-01-01 NOTE — Progress Notes (Signed)
Patient Name: [name]      SUBJECTIVE: much better, less nausea diminished pain   Past Medical History  Diagnosis Date  . Diverticulosis   . GERD (gastroesophageal reflux disease)   . Esophageal stricture   . Hemorrhoids   . Mallory - Weiss tear   . Interstitial cystitis   . ADHD (attention deficit hyperactivity disorder)   . Breast cancer     right side  . Arthritis   . Blood transfusion   . Thyroid disease   . Allergy     trees/pollen, mold, fungus, dust mites. Takes allergy shots  . Latex allergy, contact dermatitis   . Cataract   . Depression   . Hernia of abdominal wall     spigelian hernia RLQ  . H/O hiatal hernia   . Neuromuscular disorder     central nervous system neuropathy- seen per Dr Sandria Manly  . Asthma     allergist Dr Illene Labrador- monthly allergy injections  . Complication of anesthesia     reaction to some anesthetics/ 7/12 anesth record on chart- states prefers epidural  . PONV (postoperative nausea and vomiting)     pt needs scop patch  . Recurrent upper respiratory infection (URI) 1/13- to present    bronchitis following surgery- states improved but still with cough. OV with Clearance Dr [name] 09/06/11 on chart    Scheduled Meds:  Scheduled Meds: . acetaminophen  650 mg Oral BID  . amphetamine-dextroamphetamine  10 mg Oral Daily  . buPROPion  150 mg Oral Daily  . docusate sodium  200 mg Oral QPM  . DULoxetine  20 mg Oral Daily  . famotidine  40 mg Oral QHS  . fluticasone  1 spray Each Nare Daily  . levothyroxine  112 mcg Oral QAC breakfast  . loratadine  10 mg Oral Daily  . mometasone-formoterol  2 puff Inhalation Daily  . montelukast  10 mg Oral Daily  . pantoprazole  80 mg Oral Q1200  . triamterene-hydrochlorothiazide  1 tablet Oral q morning - 10a  . vitamin B-12  50 mcg Oral Daily   Continuous Infusions: . sodium chloride 100 mL/hr at 01/01/13 0148    PHYSICAL EXAM Filed Vitals:   01/01/13 0400 01/01/13 0500 01/01/13 0600  01/01/13 0700  BP: 97/38 91/47 114/102 121/46  Pulse: 67 67 76 63  Temp: 98.4 F (36.9 C)     TempSrc: Oral     Resp: 14 16 15 26   Height:      Weight:      SpO2: 94% 95% 95% 96%    Well developed and nourished in no acute distress--looking much better HENT normal Neck supple with JVP-8-10 Clear Regular rate and rhythm, no murmurs or gallops Abd-soft with active BS No Clubbing cyanosis edema Skin-warm and dry A & Oriented  Grossly normal sensory and motor function   TELEMETRY: Reviewed telemetry pt in nsr with atrial undersensing    Intake/Output Summary (Last 24 hours) at 01/01/13 0814 Last data filed at 01/01/13 0600  Gross per 24 hour  Intake 1876.67 ml  Output   1475 ml  Net 401.67 ml    LABS: Basic Metabolic Panel:  Recent Labs Lab 12/26/12 2025 12/27/12 0839 12/28/12 0543 12/29/12 0440 12/31/12 1204  NA 130* 135 135 131* 132*  K 3.1* 3.9 3.8 3.6 3.9  CL 93* 104 103 98 98  CO2 24 22 22 22 23   GLUCOSE 88 90 105* 98 121*  BUN 14 8 8 10  13  CREATININE 0.70 0.58 0.68 0.65 0.56  CALCIUM 9.6 9.2 9.2 9.2 8.9  MG 2.0 2.2 2.2  --  2.2   Cardiac Enzymes: No results found for this basename: CKTOTAL, CKMB, CKMBINDEX, TROPONINI,  in the last 72 hours CBC:  Recent Labs Lab 12/26/12 2025 12/27/12 0839 12/29/12 0440  WBC 5.9 5.4 6.6  NEUTROABS 3.0  --   --   HGB 14.4 13.4 14.6  HCT 40.3 36.5 41.7  MCV 89.6 88.6 90.7  PLT 239 190 195   PROTIME: No results found for this basename: LABPROT, INR,  in the last 72 hours Liver Function Tests: No results found for this basename: AST, ALT, ALKPHOS, BILITOT, PROT, ALBUMIN,  in the last 72 hours No results found for this basename: LIPASE, AMYLASE,  in the last 72 hours BNP: BNP (last 3 results)  Recent Labs  12/26/12 2025  PROBNP 763.0*    :    Device Interrogation:  Pwave 0.8    ASSESSMENT AND PLAN:  Active Problems:   Second degree heart block   Hypothyroidism   Pericardial effusion, acute    Sinus tachycardia  Hemodynamics much improved with volume, HR now in 60-80s, also with adderrall resumption  Repeat echo pending  Atrial undersensing, P wave  2.3>>0.8  Device reprogrammed  Check CXR  Signed, Sherryl Manges MD  01/01/2013

## 2013-01-02 ENCOUNTER — Inpatient Hospital Stay (HOSPITAL_COMMUNITY): Payer: Medicare Other

## 2013-01-02 ENCOUNTER — Encounter (HOSPITAL_COMMUNITY): Payer: Self-pay | Admitting: Nurse Practitioner

## 2013-01-02 DIAGNOSIS — R55 Syncope and collapse: Secondary | ICD-10-CM

## 2013-01-02 DIAGNOSIS — R001 Bradycardia, unspecified: Secondary | ICD-10-CM

## 2013-01-02 DIAGNOSIS — J438 Other emphysema: Secondary | ICD-10-CM | POA: Diagnosis not present

## 2013-01-02 MED ORDER — HYDROCORTISONE 1 % EX CREA
TOPICAL_CREAM | CUTANEOUS | Status: DC | PRN
  Filled 2013-01-02 (×2): qty 28

## 2013-01-02 NOTE — Progress Notes (Signed)
Patient ID: [name], female   DOB: 11/13/1945, 67 y.o.   MRN: [id number] Subjective:  " I feel better. My chest still hurts a little bit"  Objective:  Vital Signs in the last 24 hours: Temp:  [97.8 F (36.6 C)-98.8 F (37.1 C)] 98.8 F (37.1 C) (08/22 0800) Pulse Rate:  [65-81] 66 (08/22 0700) Resp:  [13-27] 20 (08/22 1000) BP: (99-176)/(46-78) 110/46 mmHg (08/22 1000) SpO2:  [91 %-100 %] 98 % (08/22 1000)  Intake/Output from previous day: 08/21 0701 - 08/22 0700 In: 850 [P.O.:250; I.V.:600] Out: 850 [Urine:850] Intake/Output from this shift: Total I/O In: 420 [P.O.:420] Out: 400 [Urine:400]  Physical Exam: Well appearing NAD HEENT: Unremarkable Neck:  7 cm JVD, no thyromegally Back:  No CVA tenderness Lungs:  Clear with no wheezes HEART:  Regular rate rhythm, no murmurs, no rubs, no clicks Abd:  Flat, positive bowel sounds, no organomegally, no rebound, no guarding Ext:  2 plus pulses, no edema, no cyanosis, no clubbing Skin:  No rashes no nodules Neuro:  CN II through XII intact, motor grossly intact  Lab Results: No results found for this basename: WBC, HGB, PLT,  in the last 72 hours  Recent Labs  12/31/12 1204  NA 132*  K 3.9  CL 98  CO2 23  GLUCOSE 121*  BUN 13  CREATININE 0.56   No results found for this basename: TROPONINI, CK, MB,  in the last 72 hours Hepatic Function Panel No results found for this basename: PROT, ALBUMIN, AST, ALT, ALKPHOS, BILITOT, BILIDIR, IBILI,  in the last 72 hours No results found for this basename: CHOL,  in the last 72 hours No results found for this basename: PROTIME,  in the last 72 hours  Imaging: Dg Chest Port 1 View  01/01/2013   *RADIOLOGY REPORT*  Clinical Data: Atrial lead dislodgement  PORTABLE CHEST - 1 VIEW  Comparison: 12/31/2012  Findings: Heart size and vascular pattern are normal.  Lungs are clear.  Two lead cardiac pacer again identified with generator over the left thorax.  Position of leads  unchanged when compared to prior study.  IMPRESSION: No acute abnormalities.   Original Report Authenticated By: Esperanza Heir, M.D.    Cardiac Studies: Tele - nsr Assessment/Plan:  1. Symptomatic bradycardia 2. S/p PPM complicated by PPM perforation,s /p revision 3. Pericardial effusion due to #2. 4. Chest pain Rec: she is improved and hemodynamically stable. Will allow her to be discharged home with NSAIDs. Usual followup with Dr. Crissie Sickles  LOS: 7 days    Sharlot Gowda Alexianna Nachreiner,M.D. 01/02/2013, 10:46 AM

## 2013-01-03 DIAGNOSIS — I309 Acute pericarditis, unspecified: Secondary | ICD-10-CM

## 2013-01-03 MED ORDER — AZITHROMYCIN 250 MG PO TABS: ORAL_TABLET | ORAL | Status: DC

## 2013-01-03 NOTE — Evaluation (Signed)
Physical Therapy Evaluation Patient Details Name: [name] MRN: [id number] DOB: 07/08/1945 Today's Date: 01/03/2013 Time: 4098-1191 PT Time Calculation (min): 17 min  PT Assessment / Plan / Recommendation History of Present Illness  This is a 67yo WF who presented to the ER with progressive SOB over the past few weeks.  Today around 2:30pm while she was walking up a few steps she became extremely SOB and got very dizzy and diaphoretic and had a near syncopal episode.  Since then she has had a total of 6 more episodes and the last one was the worst.  She did not lose consciousness but lost her vision.  She could not catch her breath during the episode.  She came to the ER and was found to be in second degree HB with HR in the 30's.  She denied any chest pain, LE edema or palpitations.  S/p pacemaker insertion.  Clinical Impression  Pt independent with mobility and ambulation.  Overall pt moving well and has no Acute PT services.  Pt was receiving OPPT for left hip prior to admission.  Therefore recommend OPPT when cleared by MD. PT will sign off.     PT Assessment  All further PT needs can be met in the next venue of care    Follow Up Recommendations  Outpatient PT (continue OPPT when cleared by MD)    Equipment Recommendations  None recommended by PT    Precautions / Restrictions Precautions Precautions: ICD/Pacemaker Restrictions Weight Bearing Restrictions: No   Pertinent Vitals/Pain No c/o pain      Mobility  Bed Mobility Bed Mobility: Supine to Sit;Sit to Supine Supine to Sit: 6: Modified independent (Device/Increase time) Sit to Supine: 6: Modified independent (Device/Increase time) Details for Bed Mobility Assistance: needs extra time to complete Transfers Transfers: Sit to Stand;Stand to Sit Sit to Stand: 7: Independent;From bed Stand to Sit: 7: Independent;To bed Ambulation/Gait Ambulation/Gait Assistance: 7: Independent Ambulation Distance (Feet): 300  Feet Assistive device: None Gait Pattern: Within Functional Limits Stairs: No    Exercises     PT Diagnosis:    PT Problem List:   PT Treatment Interventions:       PT Goals(Current goals can be found in the care plan section) Acute Rehab PT Goals Patient Stated Goal: To return home and return to OPPT for left hip when cleared by MD PT Goal Formulation: No goals set, d/c therapy  Visit Information  Last PT Received On: 01/03/13 Assistance Needed: +1 History of Present Illness: This is a 67yo WF who presented to the ER with progressive SOB over the past few weeks.  Today around 2:30pm while she was walking up a few steps she became extremely SOB and got very dizzy and diaphoretic and had a near syncopal episode.  Since then she has had a total of 6 more episodes and the last one was the worst.  She did not lose consciousness but lost her vision.  She could not catch her breath during the episode.  She came to the ER and was found to be in second degree HB with HR in the 30's.  She denied any chest pain, LE edema or palpitations.  S/p pacemaker insertion.       Prior Functioning  Home Living Family/patient expects to be discharged to:: Private residence Living Arrangements: Spouse/significant other Available Help at Discharge: Family Type of Home: House Home Access: Elevator Home Layout: Multi-level Home Equipment: None Prior Function Level of Independence: Independent    Cognition  Cognition Arousal/Alertness: Awake/alert Behavior During Therapy: WFL for tasks assessed/performed Overall Cognitive Status: Within Functional Limits for tasks assessed    Extremity/Trunk Assessment Upper Extremity Assessment Upper Extremity Assessment: LUE deficits/detail LUE Deficits / Details: Limited shoulder flexion due to pacemaker insertion   Balance    End of Session PT - End of Session Equipment Utilized During Treatment: Gait belt Activity Tolerance: Patient tolerated treatment  well Patient left: in bed Nurse Communication: Mobility status  GP     Willys Salvino 01/03/2013, 4:33 PM  Jake Shark, PT DPT [phone]

## 2013-01-03 NOTE — Discharge Summary (Addendum)
Physician Discharge Summary  Patient ID: [name] MRN: [id number] DOB/AGE: 1945-11-22 67 y.o.  Admit date: 12/26/2012 Discharge date: 01/04/2013  Primary Cardiologist: Sherryl Manges, MD   Primary Discharge Diagnosis: 1 Symptomatic bradycardia  - Bifascicular block and intermittent high-grade symptomatic heart block  - Syncope/near syncope  - S/p PPM complicated by PPM perforation, s/p revision 2 Pericardial effusion  - S/p PPM complication 2 Electrolyte abnormalities  - Hypokalemia/hyponatremia 3 Acute DHF 4 Bronchitis 5 Hypothyroidism  Secondary Discharge Diagnoses: Past Medical History  Diagnosis Date  . Diverticulosis   . GERD (gastroesophageal reflux disease)   . Esophageal stricture   . Hemorrhoids   . Mallory - Weiss tear   . Interstitial cystitis   . ADHD (attention deficit hyperactivity disorder)   . Breast cancer     right side  . Arthritis   . Blood transfusion   . Thyroid disease   . Allergy     trees/pollen, mold, fungus, dust mites. Takes allergy shots  . Latex allergy, contact dermatitis   . Cataract   . Depression   . Hernia of abdominal wall     spigelian hernia RLQ  . H/O hiatal hernia   . Neuromuscular disorder     central nervous system neuropathy- seen per Dr Sandria Manly  . Asthma     allergist Dr Illene Labrador- monthly allergy injections  . Complication of anesthesia     reaction to some anesthetics/ 7/12 anesth record on chart- states prefers epidural  . PONV (postoperative nausea and vomiting)     pt needs scop patch  . Recurrent upper respiratory infection (URI) 1/13- to present    bronchitis following surgery- states improved but still with cough. OV with [facility] Dr [name] 09/06/11 on chart  . Symptomatic bradycardia     a. 12/2012 s/p MDT dual chamber PPM, ser # [id number]; b. 12/2012 post-op course complicated by pericardial effusinon req lead revision.  . Pericardial effusion     a. 12/2012 following ppm placement;  b. 01/01/2013 Echo: EF  55-60%, small pericardial effusion w/o RV collapse-->No need for tap/window.  . Chest pain     a. 12/2012 Cath: nl cors, EF 55-65%.    Reason for Admission: 67yo WF who presented to the ER with progressive SOB, and was found to be in second degree HB with HR in the 30's.     Procedures: 2D Echocardiogram, 01/01/2013: Study Conclusions  - Left ventricle: Systolic function was normal. The estimated ejection fraction was in the range of 55% to 60%. - Pericardium, extracardiac: A small pericardial effusion was identified. Impressions:  - Limited study to RO pericardial effusion; normal LV function; small pericardial effusion with no RV collapse; mildly dilated IVC but collapses with inspiration.  Cardiac Catheterization, 12/29/2012: Hemodynamics:  AO 124/45 mean 73 mm Hg  LV 122/14 mm Hg  Coronary angiography:  Coronary dominance: right  Left mainstem: Normal  Left anterior descending (LAD): Normal  Left circumflex (LCx): Normal  Right coronary artery (RCA): Normal  Left ventriculography: Left ventricular systolic function is normal, LVEF is estimated at 55-65%, there is no significant mitral regurgitation  Final Conclusions:  1. Normal coronary angiography  2. Normal LV function.  Dual pacemaker implantation, 12/29/2012: Medtronic pulse generator serial number [id number] Medtronic ventricular lead serial [id number]; Medtronic atrial lead serial number [id number] .   Hospital Course: Patient was deemed hemodynamically stable upon presentation, and, therefore, temporary pacer placement was not indicated. She was referred to Dr. Graciela Husbands for consideration of permanent PPM  implantation. He noted that initial cardiac evaluation included negative cardiac enzymes, and echo demonstrated NL LVF. TSH was mildly elevated, but T4 was normal. She was on no medications to cause AV nodal blockade. EKG: NSR with RBBB and LAD. Dr. Graciela Husbands concluded that this was an appropriate Class I  indication for pacing.  Following results of cardiac catheterization, she was cleared to proceed with PPM implantation, performed by Dr. Graciela Husbands.  Post op course complicated by development of pleuritic CP and SOB. Dr. Graciela Husbands suspected microperforation, given pleuritic chest pain, and prob RV lead. He recommended lead revision, which was performed by Dr. Ladona Ridgel the following day, without immediate complication.  Repeat echo suggested moderate effusion. Device interrogation>>sinus tach, not PMT. Echo was reviewed with Dr DM, and showed some degree of RV collapse. The effusion was about the same; there was no evidence of IVC dilitation; transmitral inflows were uninterpretable w/ tachycardia.  Patient was then referred to Dr. Tyrone Sage for consultation. His review of echo - small effusion present, likely less than yesterday, without clinical tamponade. He did not recommend pericardial window or sternotomy, at this point.  Patient continued to improve clinically; CP treated with NSAIDs. Followup echo revealed no RV collapse; IVC collapses with inspiration.  Final recommendations per Dr. Jens Som, at time of discharge, were to allow her to be discharged home with NSAIDs, and a Zpack for treatment of early bronchitis. He also noted that she did not want to take Adderall. He recommended stopping this, and having her followup with her primary MD for this issue, as well as her Hypothyroidism.   Discharge Vitals: Blood pressure 125/66, pulse 66, temperature 98.4 F (36.9 C), temperature source Oral, resp. rate 20, height 5' 6.5" (1.689 m), weight 138 lb 3.7 oz (62.7 kg), SpO2 98.00%.  Labs: Lab Results  Component Value Date   WBC 6.6 12/29/2012   HGB 14.6 12/29/2012   HCT 41.7 12/29/2012   MCV 90.7 12/29/2012   PLT 195 12/29/2012      Recent Labs Lab 12/31/12 1204  NA 132*  K 3.9  CL 98  CO2 23  BUN 13  CREATININE 0.56  CALCIUM 8.9  GLUCOSE 121*    No results found for this basename: CHOL,   HDL,  LDLCALC,  TRIG    Lab Results  Component Value Date   DDIMER  Value: <0.22        AT THE INHOUSE ESTABLISHED CUTOFF VALUE OF 0.48 ug/mL FEU, THIS ASSAY HAS BEEN DOCUMENTED IN THE LITERATURE TO HAVE A SENSITIVITY AND NEGATIVE PREDICTIVE VALUE OF AT LEAST 98 TO 99%.  THE TEST RESULT SHOULD BE CORRELATED WITH AN ASSESSMENT OF THE CLINICAL PROBABILITY OF DVT / VTE. 02/09/2009    Lab Results  Component Value Date   TSH 6.360* 12/27/2012    No results found for this basename: CKTOTAL, CKMB, TROPONINI,  in the last 72 hours  Diagnostic Studies: Dg Chest 2 View  01/02/2013   *RADIOLOGY REPORT*  Clinical Data: Cough, history asthma, question pneumonia  CHEST - 2 VIEW  Comparison: 01/01/2013  Findings: Left subclavian transvenous pacemaker leads project at right atrium and right ventricle. Normal heart size, mediastinal contours, and pulmonary vascularity. Emphysematous changes with biapical scarring asymmetrically greater on the right, unchanged. Minimal blunting of the posterior costophrenic angles question tiny effusions. No acute infiltrate or pneumothorax. Prior cervical spine fusion. Post right mastectomy and axillary lymph node dissection.  IMPRESSION: Emphysematous changes with biapical scarring greater on the right. Tiny bilateral pleural effusions.   Original  Report Authenticated By: Ulyses Southward, M.D.   Dg Chest 2 View  12/31/2012   *RADIOLOGY REPORT*  Clinical Data: Status post pacemaker lead revision.  CHEST - 2 VIEW  Comparison: 12/30/2012.  Findings: Normal heart size with clear lung fields.  No bony abnormality.  Dual lead permanent transvenous pacer from left subclavian approach appears satisfactory. No pneumothorax.  Right axillary node dissection. Right breast implant.  No osseous findings.   Prior cervical fusion.  IMPRESSION: No active cardiopulmonary disease. Stable appearance from priors.   Original Report Authenticated By: Davonna Belling, M.D.   Dg Chest 2 View  12/30/2012    *RADIOLOGY REPORT*  Clinical Data: Continued pleuritic pain after implant.  CHEST - 2 VIEW  Comparison: 12/29/2012  Findings: Stable appearance of cardiac pacemaker.  Postoperative changes in the cervical spine and right axilla.  Normal heart size and pulmonary vascularity.  No focal consolidation or airspace disease.  No pleural thickening.  No pneumothorax.  Mild degenerative changes in the spine.  No significant change since previous study.  IMPRESSION: No evidence of active pulmonary disease.   Original Report Authenticated By: Burman Nieves, M.D.   Dg Chest 2 View  12/26/2012   *RADIOLOGY REPORT*  Clinical Data: 67 year old female with pain.  CHEST - 2 VIEW  Comparison: 08/31/2011 and earlier.  Findings: Upright AP and lateral views of the chest.  Postoperative changes to the right chest wall and axilla re-identified.  Slightly lower lung volumes.  Stable and negative cardiac mediastinal contours.  No pneumothorax, pulmonary edema, pleural effusion or confluent pulmonary opacity. No acute osseous abnormality identified.  Cervical ACDF hardware partially visible.  IMPRESSION: No acute cardiopulmonary abnormality.   Original Report Authenticated By: Erskine Speed, M.D.   Dg Chest Port 1 View  01/01/2013   *RADIOLOGY REPORT*  Clinical Data: Atrial lead dislodgement  PORTABLE CHEST - 1 VIEW  Comparison: 12/31/2012  Findings: Heart size and vascular pattern are normal.  Lungs are clear.  Two lead cardiac pacer again identified with generator over the left thorax.  Position of leads unchanged when compared to prior study.  IMPRESSION: No acute abnormalities.   Original Report Authenticated By: Esperanza Heir, M.D.   Dg Chest Port 1 View  12/29/2012   *RADIOLOGY REPORT*  Clinical Data: Pain when breathing post pacer.  PORTABLE CHEST - 1 VIEW  Comparison: 12/26/2012  Findings: Interval placement of cardiac pacemaker with lead tips over the RA and RV regions.  No pneumothorax.  Normal heart size and pulmonary  vascularity.  No focal consolidation or airspace disease in the lungs.  No blunting of costophrenic angles.  No pneumothorax.  Mediastinal contours appear intact.  Postoperative changes in the right axilla. Postoperative changes in the cervical spine.  IMPRESSION: Interval placement cardiac pacemaker.  No evidence of active pulmonary disease.  No pneumothorax.   Original Report Authenticated By: Burman Nieves, M.D.     DISPOSITION: Stable condition  FOLLOW UP PLANS AND APPOINTMENTS: Discharge Orders   Future Appointments Provider Department Dept Phone   01/14/2013 3:30 PM [facility] [address] Main Office Center) [phone]   02/13/2013 9:45 AM Duke Salvia, MD [facility] [place]) [phone]   Future Orders Complete By Expires   Diet - low sodium heart healthy  As directed    Diet - low sodium heart healthy  As directed    Increase activity slowly  As directed    Increase activity slowly  As directed  Follow-up Information   Follow up with [facility] On 01/14/2013. (3:30 PM)    Contact information:   [address] [place] [phone]      Follow up with Sherryl Manges, MD On 02/13/2013. (9:45 AM)    Specialty:  Cardiology   Contact information:   [address] [place] [address] [phone]       DISCHARGE MEDICATIONS:   Medication List    STOP taking these medications       amphetamine-dextroamphetamine 10 MG tablet  Commonly known as:  ADDERALL      TAKE these medications       albuterol 108 (90 BASE) MCG/ACT inhaler  Commonly known as:  PROVENTIL HFA;VENTOLIN HFA  Inhale 2 puffs into the lungs every 6 (six) hours as needed. For shortness of breath     ALIGN 4 MG Caps  Take 4 mg by mouth daily.     azithromycin 250 MG tablet  Commonly known as:  ZITHROMAX Z-PAK  Take as directed     b complex vitamins capsule  Take 1 capsule by mouth daily.     buPROPion 150 MG 12 hr  tablet  Commonly known as:  WELLBUTRIN SR  Take 150 mg by mouth daily.     calcium carbonate 500 MG chewable tablet  Commonly known as:  TUMS - dosed in mg elemental calcium  Chew 1 tablet by mouth as needed.     CALTRATE 600+D 600-400 MG-UNIT per chew tablet  Generic drug:  Calcium Carbonate-Vitamin D  Chew 1 tablet by mouth daily.     cholecalciferol 1000 UNITS tablet  Commonly known as:  VITAMIN D  Take 1,000 Units by mouth daily.     clonazePAM 1 MG tablet  Commonly known as:  KLONOPIN  Take 1 mg by mouth 3 (three) times daily as needed.     docusate sodium 100 MG capsule  Commonly known as:  COLACE  Take 200 mg by mouth every evening.     DULoxetine 20 MG capsule  Commonly known as:  CYMBALTA  Take 20 mg by mouth daily.     esomeprazole 40 MG capsule  Commonly known as:  NEXIUM  Take 40 mg by mouth daily before breakfast.     estradiol 0.025 MG/24HR  Commonly known as:  VIVELLE-DOT  Place 1 patch onto the skin 2 (two) times a week.     estradiol 2 MG vaginal ring  Commonly known as:  ESTRING  Place 2 mg vaginally every 3 (three) months. follow package directions     fexofenadine 180 MG tablet  Commonly known as:  ALLEGRA  Take 180 mg by mouth daily.     fish oil-omega-3 fatty acids 1000 MG capsule  Take 2 g by mouth daily.     levothyroxine 112 MCG tablet  Commonly known as:  SYNTHROID, LEVOTHROID  Take 112 mcg by mouth every morning.     magnesium 30 MG tablet  Take 30 mg by mouth daily.     mometasone 220 MCG/INH inhaler  Commonly known as:  ASMANEX  Inhale 2 puffs into the lungs daily.     mometasone 50 MCG/ACT nasal spray  Commonly known as:  NASONEX  Place 2 sprays into the nose daily.     mometasone-formoterol 100-5 MCG/ACT Aero  Commonly known as:  DULERA  Inhale 2 puffs into the lungs.     montelukast 10 MG tablet  Commonly known as:  SINGULAIR  Take 10 mg by mouth daily.     Olive Leaf 250 MG Caps  Take 1 capsule by mouth.      pentosan polysulfate 100 MG capsule  Commonly known as:  ELMIRON  Take 100 mg by mouth 3 (three) times daily.     pirbuterol 200 MCG/INH inhaler  Commonly known as:  MAXAIR  Inhale 2 puffs into the lungs 4 (four) times daily as needed.     polyethylene glycol packet  Commonly known as:  MIRALAX / GLYCOLAX  Take 17 g by mouth as needed.     Potassium 75 MG Tabs  Take by mouth.     ranitidine 300 MG tablet  Commonly known as:  ZANTAC  Take 300 mg by mouth at bedtime.     triamterene-hydrochlorothiazide 75-50 MG per tablet  Commonly known as:  MAXZIDE  Take 1 tablet by mouth every morning.     vitamin B-12 100 MCG tablet  Commonly known as:  CYANOCOBALAMIN  Take 50 mcg by mouth daily.     vitamin C 100 MG tablet  Take 100 mg by mouth daily.     ZICAM ALLERGY RELIEF NA  Place 1 spray into the nose 2 (two) times daily as needed. For allergy relief        BRING ALL MEDICATIONS WITH YOU TO FOLLOW UP APPOINTMENTS  Time spent with patient to include physician time: Greater than 30 minutes, including physician time.  SignedPrescott Parma 01/04/2013, 12:50 PM Co-Sign MD

## 2013-01-03 NOTE — Progress Notes (Addendum)
Patient ID: [name], female   DOB: 12-09-1945, 67 y.o.   MRN: [id number] Subjective:  Some pleuritic CP unchanged; no SOB; complains of productive cough.  Objective:  Vital Signs in the last 24 hours: Temp:  [97.3 F (36.3 C)-98.8 F (37.1 C)] 97.8 F (36.6 C) (08/23 0400) Resp:  [19-20] 20 (08/22 1000) BP: (106-135)/(46-88) 114/62 mmHg (08/23 0400) SpO2:  [98 %-100 %] 98 % (08/23 0400)  Intake/Output from previous day: 08/22 0701 - 08/23 0700 In: 660 [P.O.:660] Out: 2200 [Urine:2200] Intake/Output from this shift:    Physical Exam: Well appearing NAD HEENT: Unremarkable Neck:  supple Lungs:  Clear  HEART:  Regular rate rhythm Abd:  NT/ND, no masses Ext:  no edema Skin:  Pacemaker site with no hematoma Neuro:  grossly intact   Recent Labs  12/31/12 1204  NA 132*  K 3.9  CL 98  CO2 23  GLUCOSE 121*  BUN 13  CREATININE 0.56   Imaging: Dg Chest 2 View  01/02/2013   *RADIOLOGY REPORT*  Clinical Data: Cough, history asthma, question pneumonia  CHEST - 2 VIEW  Comparison: 01/01/2013  Findings: Left subclavian transvenous pacemaker leads project at right atrium and right ventricle. Normal heart size, mediastinal contours, and pulmonary vascularity. Emphysematous changes with biapical scarring asymmetrically greater on the right, unchanged. Minimal blunting of the posterior costophrenic angles question tiny effusions. No acute infiltrate or pneumothorax. Prior cervical spine fusion. Post right mastectomy and axillary lymph node dissection.  IMPRESSION: Emphysematous changes with biapical scarring greater on the right. Tiny bilateral pleural effusions.   Original Report Authenticated By: Ulyses Southward, M.D.   Dg Chest Port 1 View  01/01/2013   *RADIOLOGY REPORT*  Clinical Data: Atrial lead dislodgement  PORTABLE CHEST - 1 VIEW  Comparison: 12/31/2012  Findings: Heart size and vascular pattern are normal.  Lungs are clear.  Two lead cardiac pacer again identified with  generator over the left thorax.  Position of leads unchanged when compared to prior study.  IMPRESSION: No acute abnormalities.   Original Report Authenticated By: Esperanza Heir, M.D.    Cardiac Studies: Tele - nsr with intermittent pacing Assessment/Plan:  1. Symptomatic bradycardia 2. S/p PPM complicated by PPM perforation,s /p revision 3. Pericardial effusion due to #2. 4. Chest pain improving Rec: Will allow her to be discharged home with NSAIDs. Usual followup with Dr. Crissie Sickles. She complains of early bronchitis. Will DC with zpack Patient states she does not want to take adderall. Will DC and have her fu with her primary care for this issue and hypothyroid. > 30 min PA and physician time D2  LOS: 8 days    Brian Crenshaw,M.D. 01/03/2013, 7:53 AM

## 2013-01-03 NOTE — Discharge Summary (Signed)
See progress notes [name]  

## 2013-01-04 ENCOUNTER — Telehealth: Payer: Self-pay | Admitting: Physician Assistant

## 2013-01-04 NOTE — Discharge Summary (Signed)
See progress notes [name]  

## 2013-01-05 NOTE — Telephone Encounter (Addendum)
Patient was reminded to remain off Adderall, as recently advised during her hospitalization. She informed me that she was, indeed, no longer taking this medication.

## 2013-01-06 DIAGNOSIS — J309 Allergic rhinitis, unspecified: Secondary | ICD-10-CM | POA: Diagnosis not present

## 2013-01-06 DIAGNOSIS — J45909 Unspecified asthma, uncomplicated: Secondary | ICD-10-CM | POA: Diagnosis not present

## 2013-01-13 ENCOUNTER — Ambulatory Visit (INDEPENDENT_AMBULATORY_CARE_PROVIDER_SITE_OTHER): Payer: Medicare Other | Admitting: *Deleted

## 2013-01-13 DIAGNOSIS — I441 Atrioventricular block, second degree: Secondary | ICD-10-CM | POA: Diagnosis not present

## 2013-01-13 DIAGNOSIS — Z950 Presence of cardiac pacemaker: Secondary | ICD-10-CM

## 2013-01-13 NOTE — Progress Notes (Signed)
Pt seen in device clinic for follow up of recently implanted pacemaker.  Wound well healed.  No redness, swelling, or edema.  Steri-strips removed today.   Device interrogated and found to be functioning normally.  Changed impedance limits in both leads from 3000 ohms to 2000 ohms. No other changes made today. See PaceArt for full details.  Pt still c/o of dyspnea when taking stairs. She says this was also present prior to receiving a device (takes 2 inhalers). Rate response is off yet only uses her device 1.8% of time.   Pt to follow up with Dr. Graciela Husbands 04/03/13 @ 4:15.   Shreyas Piatkowski, Leonette Most 01/13/2013 5:28 PM

## 2013-01-14 ENCOUNTER — Ambulatory Visit: Payer: TRICARE For Life (TFL)

## 2013-01-15 DIAGNOSIS — J45909 Unspecified asthma, uncomplicated: Secondary | ICD-10-CM | POA: Diagnosis not present

## 2013-01-15 LAB — PACEMAKER DEVICE OBSERVATION
AL AMPLITUDE: 2.75 mv
AL IMPEDENCE PM: 399 Ohm
AL THRESHOLD: 0.625 V
ATRIAL PACING PM: 0.68
RV LEAD AMPLITUDE: 7.5 mv
RV LEAD IMPEDENCE PM: 741 Ohm

## 2013-01-16 ENCOUNTER — Telehealth: Payer: Self-pay | Admitting: Internal Medicine

## 2013-01-16 DIAGNOSIS — J45909 Unspecified asthma, uncomplicated: Secondary | ICD-10-CM | POA: Diagnosis not present

## 2013-01-16 NOTE — Telephone Encounter (Signed)
New problem   [name]/[facility] need fax for pt to resume Physical Therapy. Please fax to [phone].

## 2013-01-20 ENCOUNTER — Emergency Department (HOSPITAL_COMMUNITY)
Admission: EM | Admit: 2013-01-20 | Discharge: 2013-01-21 | Disposition: A | Discharge status: Home / Self Care | Payer: Medicare Other | Attending: Emergency Medicine | Admitting: Emergency Medicine

## 2013-01-20 ENCOUNTER — Emergency Department (HOSPITAL_COMMUNITY): Payer: Medicare Other

## 2013-01-20 ENCOUNTER — Encounter (HOSPITAL_COMMUNITY): Payer: Self-pay | Admitting: *Deleted

## 2013-01-20 ENCOUNTER — Encounter: Payer: Self-pay | Admitting: *Deleted

## 2013-01-20 ENCOUNTER — Telehealth: Payer: Self-pay | Admitting: *Deleted

## 2013-01-20 DIAGNOSIS — Z882 Allergy status to sulfonamides status: Secondary | ICD-10-CM | POA: Diagnosis not present

## 2013-01-20 DIAGNOSIS — F329 Major depressive disorder, single episode, unspecified: Secondary | ICD-10-CM | POA: Diagnosis not present

## 2013-01-20 DIAGNOSIS — Z885 Allergy status to narcotic agent status: Secondary | ICD-10-CM | POA: Insufficient documentation

## 2013-01-20 DIAGNOSIS — Z950 Presence of cardiac pacemaker: Secondary | ICD-10-CM | POA: Insufficient documentation

## 2013-01-20 DIAGNOSIS — Z5189 Encounter for other specified aftercare: Secondary | ICD-10-CM | POA: Insufficient documentation

## 2013-01-20 DIAGNOSIS — Z79899 Other long term (current) drug therapy: Secondary | ICD-10-CM | POA: Insufficient documentation

## 2013-01-20 DIAGNOSIS — I319 Disease of pericardium, unspecified: Secondary | ICD-10-CM | POA: Diagnosis not present

## 2013-01-20 DIAGNOSIS — Z853 Personal history of malignant neoplasm of breast: Secondary | ICD-10-CM | POA: Insufficient documentation

## 2013-01-20 DIAGNOSIS — F3289 Other specified depressive episodes: Secondary | ICD-10-CM | POA: Insufficient documentation

## 2013-01-20 DIAGNOSIS — K219 Gastro-esophageal reflux disease without esophagitis: Secondary | ICD-10-CM | POA: Insufficient documentation

## 2013-01-20 DIAGNOSIS — Z91040 Latex allergy status: Secondary | ICD-10-CM | POA: Insufficient documentation

## 2013-01-20 DIAGNOSIS — R0609 Other forms of dyspnea: Secondary | ICD-10-CM | POA: Diagnosis not present

## 2013-01-20 DIAGNOSIS — G709 Myoneural disorder, unspecified: Secondary | ICD-10-CM | POA: Insufficient documentation

## 2013-01-20 DIAGNOSIS — J45909 Unspecified asthma, uncomplicated: Secondary | ICD-10-CM | POA: Insufficient documentation

## 2013-01-20 DIAGNOSIS — E079 Disorder of thyroid, unspecified: Secondary | ICD-10-CM | POA: Diagnosis not present

## 2013-01-20 DIAGNOSIS — Z9109 Other allergy status, other than to drugs and biological substances: Secondary | ICD-10-CM | POA: Diagnosis not present

## 2013-01-20 DIAGNOSIS — M129 Arthropathy, unspecified: Secondary | ICD-10-CM | POA: Diagnosis not present

## 2013-01-20 DIAGNOSIS — Z8719 Personal history of other diseases of the digestive system: Secondary | ICD-10-CM | POA: Insufficient documentation

## 2013-01-20 DIAGNOSIS — R0789 Other chest pain: Secondary | ICD-10-CM | POA: Diagnosis not present

## 2013-01-20 DIAGNOSIS — R079 Chest pain, unspecified: Secondary | ICD-10-CM

## 2013-01-20 DIAGNOSIS — Z880 Allergy status to penicillin: Secondary | ICD-10-CM | POA: Insufficient documentation

## 2013-01-20 DIAGNOSIS — H269 Unspecified cataract: Secondary | ICD-10-CM | POA: Insufficient documentation

## 2013-01-20 DIAGNOSIS — R0989 Other specified symptoms and signs involving the circulatory and respiratory systems: Secondary | ICD-10-CM | POA: Insufficient documentation

## 2013-01-20 DIAGNOSIS — Z881 Allergy status to other antibiotic agents status: Secondary | ICD-10-CM | POA: Insufficient documentation

## 2013-01-20 DIAGNOSIS — I498 Other specified cardiac arrhythmias: Secondary | ICD-10-CM | POA: Diagnosis not present

## 2013-01-20 DIAGNOSIS — C50919 Malignant neoplasm of unspecified site of unspecified female breast: Secondary | ICD-10-CM | POA: Diagnosis not present

## 2013-01-20 DIAGNOSIS — F909 Attention-deficit hyperactivity disorder, unspecified type: Secondary | ICD-10-CM | POA: Diagnosis not present

## 2013-01-20 LAB — POCT I-STAT, CHEM 8
Calcium, Ion: 1.25 mmol/L (ref 1.13–1.30)
Chloride: 104 mEq/L (ref 96–112)
Creatinine, Ser: 1 mg/dL (ref 0.50–1.10)
Glucose, Bld: 80 mg/dL (ref 70–99)
HCT: 43 % (ref 36.0–46.0)

## 2013-01-20 NOTE — Telephone Encounter (Signed)
Called patient and advised her to go to the ED per Dr. Graciela Husbands. Pt c/o sharp pains in chest, states that every time she breaths it hurts. She also states her HR is tachycardic. Asked her to let us know how she is, and I told her i would follow up on Thursday to see if she had to go to hospital.

## 2013-01-20 NOTE — ED Provider Notes (Addendum)
CSN: [id number]     Arrival date & time 01/20/13  1938 History   First MD Initiated Contact with Patient 01/20/13 2118     Chief Complaint  Patient presents with  . Shortness of Breath  . Chest Pain   (Consider location/radiation/quality/duration/timing/severity/associated sxs/prior Treatment) HPI Patient presents with chest pain.  She states that this episode of chest pain began approximately 3 days ago. Since onset symptoms have been persistent.  There is associated dyspnea.  Symptoms are described as heaviness across the anterior chest.  Symptoms are worse with spine flexion, better when supine. Patient has notable Hx of pacemaker placement 12/31/12 w subsequent development of a pericardial effusion.  No effusion did not require surgery, but did take some time to resolve.  She states that these symptoms are similar to those   Past Medical History  Diagnosis Date  . Diverticulosis   . GERD (gastroesophageal reflux disease)   . Esophageal stricture   . Hemorrhoids   . Mallory - Weiss tear   . Interstitial cystitis   . ADHD (attention deficit hyperactivity disorder)   . Breast cancer     right side  . Arthritis   . Blood transfusion   . Thyroid disease   . Allergy     trees/pollen, mold, fungus, dust mites. Takes allergy shots  . Latex allergy, contact dermatitis   . Cataract   . Depression   . Hernia of abdominal wall     spigelian hernia RLQ  . H/O hiatal hernia   . Neuromuscular disorder     central nervous system neuropathy- seen per Dr Sandria Manly  . Asthma     allergist Dr Illene Labrador- monthly allergy injections  . Complication of anesthesia     reaction to some anesthetics/ 7/12 anesth record on chart- states prefers epidural  . PONV (postoperative nausea and vomiting)     pt needs scop patch  . Recurrent upper respiratory infection (URI) 1/13- to present    bronchitis following surgery- states improved but still with cough. OV with Clearance Dr [name] 09/06/11 on chart  .  Symptomatic bradycardia     a. 12/2012 s/p MDT dual chamber PPM, ser # [id number]; b. 12/2012 post-op course complicated by pericardial effusinon req lead revision.  . Pericardial effusion     a. 12/2012 following ppm placement;  b. 01/01/2013 Echo: EF 55-60%, small pericardial effusion w/o RV collapse-->No need for tap/window.  . Chest pain     a. 12/2012 Cath: nl cors, EF 55-65%.   Past Surgical History  Procedure Laterality Date  . Mastectomy modified radical      right; with immediate reconstruction  . Laparoscopy  1973  . Abdominal hysterectomy  1974  . Cystoscopy  1975, 2007  . Myringoplasty  1962  . Tympanoplasty  1973    right  . Oophorectomy  1982  . Bladder suspension    . Rectocele repair      with cystocele repair  . Total hip arthroplasty    . Ventral hernia repair  05/30/2011    Procedure: HERNIA REPAIR VENTRAL ADULT;  Surgeon: Currie Paris, MD;  Location:  [facility];  Service: General;  Laterality: Right;  repair right spigelian hernia  . Breast biopsy  2002    NO BLOOD PRESSURES ON RIGHT SIDE/   s/p  axillary node dissection  . Tonsillectomy    . Back surgery      cervical fusion 4-5 with plate  . Brain surgery  bilateral lasik  . Cysto with hydrodistension  09/07/2011    Procedure: CYSTOSCOPY/HYDRODISTENSION;  Surgeon: Kathi Ludwig, MD;  Location: [facility];  Service: Urology;  Laterality: N/A;  INSTILLATION OF MARCAINE/PYRIDIUM INSTILLATION OF MARCAINE/KENALOG  . Pacemaker insertion     Family History  Problem Relation Age of Onset  . Heart disease Father   . Diabetes      aunt  . Uterine cancer      grandmother  . Colon cancer Mother   . Depression Mother   . Cancer Mother     colon  . Pancreatic cancer Sister   . Cancer Sister     pancreatic  . Cancer Maternal Grandmother     ovarian   History  Substance Use Topics  . Smoking status: Never Smoker   . Smokeless tobacco: Never Used  . Alcohol Use: 4.2 oz/week    7  Glasses of wine per week     Comment: nightly   OB History   Grav Para Term Preterm Abortions TAB SAB Ect Mult Living                 Review of Systems  Allergies  Anesthetics, amide; Sulfonamide derivatives; Azithromycin; Bactroban; Ciprofloxacin; Codeine; Meperidine hcl; Morphine; Neosporin; Nitrofurantoin; Penicillins; and Latex  Home Medications   Current Outpatient Rx  Name  Route  Sig  Dispense  Refill  . albuterol (PROVENTIL HFA;VENTOLIN HFA) 108 (90 BASE) MCG/ACT inhaler   Inhalation   Inhale 2 puffs into the lungs every 6 (six) hours as needed. For shortness of breath         . Ascorbic Acid (VITAMIN C) 100 MG tablet   Oral   Take 100 mg by mouth every morning.          Marland Kitchen b complex vitamins capsule   Oral   Take 1 capsule by mouth daily.          Marland Kitchen buPROPion (WELLBUTRIN SR) 150 MG 12 hr tablet   Oral   Take 150 mg by mouth daily.          . Calcium Carbonate-Vitamin D (CALTRATE 600+D) 600-400 MG-UNIT per chew tablet   Oral   Chew 1 tablet by mouth daily.          . cholecalciferol (VITAMIN D) 1000 UNITS tablet   Oral   Take 1,000 Units by mouth daily.         . ciclesonide (OMNARIS) 50 MCG/ACT nasal spray   Each Nare   Place 2 sprays into both nostrils 2 (two) times daily.         . clonazePAM (KLONOPIN) 1 MG tablet   Oral   Take 1 mg by mouth at bedtime as needed. For insomnia         . docusate sodium (COLACE) 100 MG capsule   Oral   Take 200 mg by mouth every evening.          . DULoxetine (CYMBALTA) 20 MG capsule   Oral   Take 20 mg by mouth daily.         Marland Kitchen esomeprazole (NEXIUM) 40 MG capsule   Oral   Take 40 mg by mouth daily before breakfast.          . estradiol (ESTRING) 2 MG vaginal ring   Vaginal   Place 2 mg vaginally every 3 (three) months. follow package directions         . estradiol (VIVELLE-DOT) 0.025 MG/24HR   Transdermal  Place 1 patch onto the skin 2 (two) times a week. Applies new patch on Sunday  & Thursday         . famotidine (PEPCID AC) 10 MG chewable tablet   Oral   Chew 10 mg by mouth at bedtime.         . fexofenadine (ALLEGRA) 180 MG tablet   Oral   Take 180 mg by mouth daily.          . fish oil-omega-3 fatty acids 1000 MG capsule   Oral   Take 1 g by mouth daily.          Marland Kitchen levothyroxine (SYNTHROID, LEVOTHROID) 112 MCG tablet   Oral   Take 112 mcg by mouth every morning.          . magnesium 30 MG tablet   Oral   Take 30 mg by mouth daily with lunch.          . mometasone-formoterol (DULERA) 100-5 MCG/ACT AERO   Inhalation   Inhale 2 puffs into the lungs every morning.          . montelukast (SINGULAIR) 10 MG tablet   Oral   Take 10 mg by mouth every evening.          Marland Kitchen POTASSIUM PO   Oral   Take 1 tablet by mouth every morning. OTC Potassium         . Probiotic Product (ALIGN) 4 MG CAPS   Oral   Take 4 mg by mouth at bedtime.          . triamterene-hydrochlorothiazide (MAXZIDE) 75-50 MG per tablet   Oral   Take 1 tablet by mouth every morning.          . vitamin B-12 (CYANOCOBALAMIN) 100 MCG tablet   Oral   Take 50 mcg by mouth every evening.          . pentosan polysulfate (ELMIRON) 100 MG capsule   Oral   Take 100 mg by mouth as needed.           BP 112/63  Pulse 68  Temp(Src) 97.6 F (36.4 C) (Oral)  Resp 16  Ht 5\' 6"  (1.676 m)  Wt 129 lb (58.514 kg)  BMI 20.83 kg/m2  SpO2 100% Physical Exam  Nursing note and vitals reviewed. Constitutional: She is oriented to person, place, and time. She appears well-developed and well-nourished. No distress.  HENT:  Head: Normocephalic and atraumatic.  Eyes: Conjunctivae and EOM are normal.  Cardiovascular: Normal rate and regular rhythm.   Pulmonary/Chest: Effort normal and breath sounds normal. No stridor. No respiratory distress.  Abdominal: She exhibits no distension.  Musculoskeletal: She exhibits no edema.  Neurological: She is alert and oriented to person,  place, and time. No cranial nerve deficit.  Skin: Skin is warm and dry.  Psychiatric: She has a normal mood and affect.    ED Course  Procedures (including critical care time) Labs Review Labs Reviewed  BASIC METABOLIC PANEL  POCT I-STAT, CHEM 8  POCT I-STAT TROPONIN I   Imaging Review Dg Chest 2 View  01/20/2013   CLINICAL DATA:  Shortness of breath. Chest pain. Asthma. Breast carcinoma.  EXAM: CHEST  2 VIEW  COMPARISON:  01/02/2013  FINDINGS: The heart size and mediastinal contours are within normal limits. Both lungs are clear. Dual lead transvenous pacemaker remains in appropriate position. Surgical clips again seen in the right axilla.  IMPRESSION: Stable exam. No active cardiopulmonary disease.   Electronically  Signed   By: Myles Rosenthal   On: 01/20/2013 20:57   I reviewed the patient's chart, including hospitalization last month with placement of pacemaker, complicated by pericardial effusion. Cardiac monitor here, 85, sinus rhythm, normal EKG has sinus rhythm, right bundle branch block, rate 76, unremarkable Pulse oximetry is 99% room air normal  Update: After the initial evaluation, on repeat evaluation the patient appears in no distress.  With a notable recent history discuss her case with our cardiologists. MDM  No diagnosis found. Patient presents after recent hospitalization for pericardial effusion complicating pacemaker placement.  On my exam she is awake alert, afebrile.  However, with the patient's description of chest pressure, some concern for recurrent effusion, labs, x-ray were performed.  These were largely reassuring.  With her notable history I discussed her case with cardiology who will assist with evaluation and disposition of the patient.    Gerhard Munch, MD 01/20/13 2349  12:10 AM Per cardiology, the patient is OK for d/c with next day f/u.   Gerhard Munch, MD 01/21/13 0010

## 2013-01-20 NOTE — Telephone Encounter (Signed)
Faxed out - ok to resume PT.

## 2013-01-20 NOTE — Telephone Encounter (Signed)
New Problem  Pt's husband states that discharge paper instructs pt to come back w/ in a week// appt is scheduled for 10/3.Marland Kitchen Husbands states that pt should get an earlier appt due to the severity of her current situation.. SOB very easily/ heart rate gets into the 120-130 beats per min... Resting heart rate is 80 or more (no documented measurements)// Request a call back to discuss appt resch.

## 2013-01-20 NOTE — ED Notes (Signed)
Pt reports she was admitted at the end of August for pacemaker placement and had a post op pericardial effusion. Reports increasing shortness of breath and chest pain since discharge worsening over the past 2-3 days.

## 2013-01-21 ENCOUNTER — Ambulatory Visit (HOSPITAL_COMMUNITY)
Admission: RE | Admit: 2013-01-21 | Discharge: 2013-01-21 | Disposition: A | Discharge status: Home / Self Care | Payer: Medicare Other | Source: Ambulatory Visit | Attending: Nurse Practitioner | Admitting: Nurse Practitioner

## 2013-01-21 ENCOUNTER — Encounter (HOSPITAL_COMMUNITY): Payer: Self-pay

## 2013-01-21 ENCOUNTER — Ambulatory Visit (HOSPITAL_BASED_OUTPATIENT_CLINIC_OR_DEPARTMENT_OTHER): Payer: Medicare Other | Admitting: Radiology

## 2013-01-21 ENCOUNTER — Encounter: Payer: Self-pay | Admitting: Nurse Practitioner

## 2013-01-21 ENCOUNTER — Ambulatory Visit (INDEPENDENT_AMBULATORY_CARE_PROVIDER_SITE_OTHER): Payer: Medicare Other | Admitting: *Deleted

## 2013-01-21 ENCOUNTER — Telehealth: Payer: Self-pay | Admitting: *Deleted

## 2013-01-21 ENCOUNTER — Emergency Department (HOSPITAL_COMMUNITY)
Admission: EM | Admit: 2013-01-21 | Discharge: 2013-01-21 | Discharge status: Against Medical Advice | Payer: Medicare Other

## 2013-01-21 ENCOUNTER — Other Ambulatory Visit (HOSPITAL_COMMUNITY): Payer: Medicare Other

## 2013-01-21 ENCOUNTER — Other Ambulatory Visit: Payer: Self-pay | Admitting: Nurse Practitioner

## 2013-01-21 ENCOUNTER — Ambulatory Visit (INDEPENDENT_AMBULATORY_CARE_PROVIDER_SITE_OTHER): Payer: Medicare Other | Admitting: Nurse Practitioner

## 2013-01-21 VITALS — BP 90/70 | HR 72 | Ht 72.2 in | Wt 133.1 lb

## 2013-01-21 DIAGNOSIS — I313 Pericardial effusion (noninflammatory): Secondary | ICD-10-CM

## 2013-01-21 DIAGNOSIS — R000 Tachycardia, unspecified: Secondary | ICD-10-CM

## 2013-01-21 DIAGNOSIS — R7989 Other specified abnormal findings of blood chemistry: Secondary | ICD-10-CM

## 2013-01-21 DIAGNOSIS — J309 Allergic rhinitis, unspecified: Secondary | ICD-10-CM | POA: Diagnosis not present

## 2013-01-21 DIAGNOSIS — R0600 Dyspnea, unspecified: Secondary | ICD-10-CM

## 2013-01-21 DIAGNOSIS — R0602 Shortness of breath: Secondary | ICD-10-CM

## 2013-01-21 DIAGNOSIS — I319 Disease of pericardium, unspecified: Secondary | ICD-10-CM

## 2013-01-21 DIAGNOSIS — R079 Chest pain, unspecified: Secondary | ICD-10-CM

## 2013-01-21 DIAGNOSIS — R0609 Other forms of dyspnea: Secondary | ICD-10-CM

## 2013-01-21 DIAGNOSIS — R0989 Other specified symptoms and signs involving the circulatory and respiratory systems: Secondary | ICD-10-CM | POA: Diagnosis not present

## 2013-01-21 DIAGNOSIS — Z950 Presence of cardiac pacemaker: Secondary | ICD-10-CM | POA: Insufficient documentation

## 2013-01-21 DIAGNOSIS — J90 Pleural effusion, not elsewhere classified: Secondary | ICD-10-CM | POA: Diagnosis not present

## 2013-01-21 DIAGNOSIS — I498 Other specified cardiac arrhythmias: Secondary | ICD-10-CM

## 2013-01-21 LAB — D-DIMER, QUANTITATIVE: D-Dimer, Quant: 0.67 ug/mL-FEU — ABNORMAL HIGH (ref 0.00–0.48)

## 2013-01-21 LAB — PACEMAKER DEVICE OBSERVATION: BAMS-0001: 150 {beats}/min

## 2013-01-21 LAB — BASIC METABOLIC PANEL
BUN: 16 mg/dL (ref 6–23)
CO2: 20 mEq/L (ref 19–32)
GFR calc non Af Amer: >90 mL/min (ref >90)
Glucose, Bld: 80 mg/dL (ref 70–99)
Potassium: 3.1 mEq/L — ABNORMAL LOW (ref 3.5–5.1)

## 2013-01-21 MED ORDER — METOPROLOL SUCCINATE ER 25 MG PO TB24: 25.0000 mg | ORAL_TABLET | Freq: Every day | ORAL | Status: DC

## 2013-01-21 MED ORDER — IOHEXOL 350 MG/ML SOLN
100.0000 mL | Freq: Once | INTRAVENOUS | Status: AC | PRN
  Administered 2013-01-21: 100 mL via INTRAVENOUS

## 2013-01-21 MED ORDER — POTASSIUM CHLORIDE CRYS ER 20 MEQ PO TBCR
40.0000 meq | EXTENDED_RELEASE_TABLET | Freq: Once | ORAL | Status: AC
  Administered 2013-01-21: 40 meq via ORAL
  Filled 2013-01-21: qty 2

## 2013-01-21 NOTE — Telephone Encounter (Signed)
S/w pt is going to the ER per Dr. Graciela Husbands for elevated d dimer for a ct of the chest with contrast and PE protocol s/w Trish to let her know pt is on her way. Dr. Graciela Husbands stated would see pt Tuesday if test was ok. Pt leaving to go to [place] thursday

## 2013-01-21 NOTE — Progress Notes (Signed)
[name] Date of Birth: 1946/02/06 Medical Record #[id number]  History of Present Illness: [name] is seen back here today following an ER visit last night. Seen for Dr. Rosezella [place]. She has a history of GERD, ADHD, breast cancer, thyroid disease, depression, and symptomatic bradycardia with PPM implant in August - complicated by RV microperforation and small pericardial effusion requiring lead revision - she did NOT require pericardiocentesis. Has had past cardiac cath last month showing normal coronaries.  Presented to the ER last night with sporadic chest discomfort, DOE and orthopnea for several weeks but more noticeable over the prior 3 days - stated her HR was increasing every time she got up and moved around - this was not seen while in the ER. She had a bedside limited portable US last night showing trace pericardial effusion and normal LV/RV systolic function - CXR with no CM. CXR showed her PPM leads in appropriate orientation. Her pacemaker was not checked last night due to recent office check on 01/13/13.  Comes in today. Here with her husband. Quite frustrated. Continues to have dyspnea and tachycardia. Has been quite sedentary since her pacemaker events and sounds like she was prior to as well. No swelling. No cough. No hemoptysis. No leg pain. Uses her Maxzide for lymphedema from breast cancer. Feels her heart racing. VERY FRUSTRATED.   Current Outpatient Prescriptions  Medication Sig Dispense Refill  . albuterol (PROVENTIL HFA;VENTOLIN HFA) 108 (90 BASE) MCG/ACT inhaler Inhale 2 puffs into the lungs every 6 (six) hours as needed. For shortness of breath      . Ascorbic Acid (VITAMIN C) 100 MG tablet Take 100 mg by mouth every morning.       Marland Kitchen b complex vitamins capsule Take 1 capsule by mouth daily.       Marland Kitchen buPROPion (WELLBUTRIN SR) 150 MG 12 hr tablet Take 150 mg by mouth daily.       . Calcium Carbonate-Vitamin D (CALTRATE 600+D) 600-400 MG-UNIT per chew tablet  Chew 1 tablet by mouth daily.       . cholecalciferol (VITAMIN D) 1000 UNITS tablet Take 1,000 Units by mouth daily.      . ciclesonide (OMNARIS) 50 MCG/ACT nasal spray Place 2 sprays into both nostrils 2 (two) times daily.      . clonazePAM (KLONOPIN) 1 MG tablet Take 1 mg by mouth at bedtime as needed. For insomnia      . docusate sodium (COLACE) 100 MG capsule Take 200 mg by mouth every evening.       . DULoxetine (CYMBALTA) 20 MG capsule Take 20 mg by mouth daily.      Marland Kitchen esomeprazole (NEXIUM) 40 MG capsule Take 40 mg by mouth daily before breakfast.       . estradiol (ESTRING) 2 MG vaginal ring Place 2 mg vaginally every 3 (three) months. follow package directions      . estradiol (VIVELLE-DOT) 0.025 MG/24HR Place 1 patch onto the skin 2 (two) times a week. Applies new patch on Sunday & Thursday      . famotidine (PEPCID AC) 10 MG chewable tablet Chew 10 mg by mouth at bedtime.      . fexofenadine (ALLEGRA) 180 MG tablet Take 180 mg by mouth daily.       . fish oil-omega-3 fatty acids 1000 MG capsule Take 1 g by mouth daily.       Marland Kitchen levothyroxine (SYNTHROID, LEVOTHROID) 112 MCG tablet Take 112 mcg by mouth every morning.       Marland Kitchen  magnesium 30 MG tablet Take 30 mg by mouth daily with lunch.       . mometasone-formoterol (DULERA) 100-5 MCG/ACT AERO Inhale 2 puffs into the lungs every morning.       . montelukast (SINGULAIR) 10 MG tablet Take 10 mg by mouth every evening.       . pentosan polysulfate (ELMIRON) 100 MG capsule Take 100 mg by mouth as needed.       Marland Kitchen POTASSIUM PO Take 1 tablet by mouth every morning. OTC Potassium      . Probiotic Product (ALIGN) 4 MG CAPS Take 4 mg by mouth at bedtime.       . triamterene-hydrochlorothiazide (MAXZIDE) 75-50 MG per tablet Take 1 tablet by mouth every morning.       . vitamin B-12 (CYANOCOBALAMIN) 100 MCG tablet Take 50 mcg by mouth every evening.        No current facility-administered medications for this visit.   Facility-Administered  Medications Ordered in Other Visits  Medication Dose Route Frequency Provider Last Rate Last Dose  . bupivacaine (MARCAINE) 0.5 % 10 mL, triamcinolone acetonide (KENALOG-40) 40 mg injection   Subcutaneous Once Kathi Ludwig, MD      . bupivacaine (MARCAINE) 0.5 % 15 mL, phenazopyridine (PYRIDIUM) 400 mg bladder mixture   Bladder Instillation Once Kathi Ludwig, MD        Allergies  Allergen Reactions  . Anesthetics, Amide Other (See Comments)    Patient unsure of names, however multiples cause swelling of airway & nausea  . Sulfonamide Derivatives Anaphylaxis and Swelling  . Azithromycin Other (See Comments)    Severe gastritis   . Bactroban Other (See Comments)    Causes sores in nose  . Ciprofloxacin     REACTION: joint swelling  . Codeine Swelling  . Meperidine Hcl Nausea Only    Hallucinations   . Morphine Nausea Only    Hallucinations   . Neosporin [Neomycin-Polymyxin-Gramicidin] Hives and Dermatitis    All topical "orin's ointment"  . Nitrofurantoin     REACTION: neuropathy in legs  . Penicillins Other (See Comments)    Swelling in joints  . Latex Rash    Past Medical History  Diagnosis Date  . Diverticulosis   . GERD (gastroesophageal reflux disease)   . Esophageal stricture   . Hemorrhoids   . Mallory - Weiss tear   . Interstitial cystitis   . ADHD (attention deficit hyperactivity disorder)   . Breast cancer     right side  . Arthritis   . Blood transfusion   . Thyroid disease   . Allergy     trees/pollen, mold, fungus, dust mites. Takes allergy shots  . Latex allergy, contact dermatitis   . Cataract   . Depression   . Hernia of abdominal wall     spigelian hernia RLQ  . H/O hiatal hernia   . Neuromuscular disorder     central nervous system neuropathy- seen per Dr Sandria Manly  . Asthma     allergist Dr Illene Labrador- monthly allergy injections  . Complication of anesthesia     reaction to some anesthetics/ 7/12 anesth record on chart- states prefers  epidural  . PONV (postoperative nausea and vomiting)     pt needs scop patch  . Recurrent upper respiratory infection (URI) 1/13- to present    bronchitis following surgery- states improved but still with cough. OV with Clearance Dr [name] 09/06/11 on chart  . Symptomatic bradycardia     a. 12/2012 s/p  MDT dual chamber PPM, ser # [id number]; b. 12/2012 post-op course complicated by pericardial effusinon req lead revision.  . Pericardial effusion     a. 12/2012 following ppm placement;  b. 01/01/2013 Echo: EF 55-60%, small pericardial effusion w/o RV collapse-->No need for tap/window.  . Chest pain     a. 12/2012 Cath: nl cors, EF 55-65%.    Past Surgical History  Procedure Laterality Date  . Mastectomy modified radical      right; with immediate reconstruction  . Laparoscopy  1973  . Abdominal hysterectomy  1974  . Cystoscopy  1975, 2007  . Myringoplasty  1962  . Tympanoplasty  1973    right  . Oophorectomy  1982  . Bladder suspension    . Rectocele repair      with cystocele repair  . Total hip arthroplasty    . Ventral hernia repair  05/30/2011    Procedure: HERNIA REPAIR VENTRAL ADULT;  Surgeon: Currie Paris, MD;  Location: [facility];  Service: General;  Laterality: Right;  repair right spigelian hernia  . Breast biopsy  2002    NO BLOOD PRESSURES ON RIGHT SIDE/   s/p  axillary node dissection  . Tonsillectomy    . Back surgery      cervical fusion 4-5 with plate  . Brain surgery      bilateral lasik  . Cysto with hydrodistension  09/07/2011    Procedure: CYSTOSCOPY/HYDRODISTENSION;  Surgeon: Kathi Ludwig, MD;  Location: [facility];  Service: Urology;  Laterality: N/A;  INSTILLATION OF MARCAINE/PYRIDIUM INSTILLATION OF MARCAINE/KENALOG  . Pacemaker insertion      History  Smoking status  . Never Smoker   Smokeless tobacco  . Never Used    History  Alcohol Use  . 4.2 oz/week  . 7 Glasses of wine per week    Comment: nightly    Family  History  Problem Relation Age of Onset  . Heart disease Father   . Diabetes      aunt  . Uterine cancer      grandmother  . Colon cancer Mother   . Depression Mother   . Cancer Mother     colon  . Pancreatic cancer Sister   . Cancer Sister     pancreatic  . Cancer Maternal Grandmother     ovarian    Review of Systems: The review of systems is per the HPI.  All other systems were reviewed and are negative.  Physical Exam: Ht 6' 0.2" (1.834 m)  Wt 133 lb 1.9 oz (60.383 kg)  BMI 17.95 kg/m2 Patient is alert and in no acute distress. She is frustrated over her situation. Quite tall and thin. Skin is warm and dry. Color is normal.  HEENT is unremarkable. Normocephalic/atraumatic. PERRL. Sclera are nonicteric. Neck is supple. No masses. No JVD. Lungs are clear. Cardiac exam shows a regular rate and rhythm. Pacemaker looks ok - stitch removed by Baxter Hire. Abdomen is soft. Extremities are without edema. Gait and ROM are intact. No gross neurologic deficits noted.  LABORATORY DATA:  PENDING  Lab Results  Component Value Date   WBC 6.6 12/29/2012   HGB 14.6 01/20/2013   HCT 43.0 01/20/2013   PLT 195 12/29/2012   GLUCOSE 80 01/20/2013   ALT 20 12/27/2012   AST 19 12/27/2012   NA 138 01/20/2013   K 3.1* 01/20/2013   CL 103 01/20/2013   CREATININE 0.63 01/20/2013   BUN 16 01/20/2013   CO2 20 01/20/2013  TSH 6.360* 12/27/2012   INR 1.00 12/27/2012    Dg Chest 2 View  01/20/2013   IMPRESSION: Stable exam. No active cardiopulmonary disease.   Electronically Signed   By: Myles Rosenthal   On: 01/20/2013 20:57   Dg Chest 2 View  01/02/2013    IMPRESSION: Emphysematous changes with biapical scarring greater on the right. Tiny bilateral pleural effusions.   Original Report Authenticated By: Ulyses Southward, M.D.   Dg Chest 2 View  12/31/2012   IMPRESSION: No active cardiopulmonary disease. Stable appearance from priors.   Original Report Authenticated By: Davonna Belling, M.D.   Dg Chest 2 View  12/30/2012     IMPRESSION: No evidence of active pulmonary disease.   Original Report Authenticated By: Burman Nieves, M.D.   Lab Results  Component Value Date   TROPONINI <0.30 12/27/2012     Assessment / Plan  . S/P recent PPM complicated by RV microdislodgement/pericardial effusion - having more tachycardia and dyspnea. Her device has been checked. She has had 15 episodes of AF longest spell was just 4 minutes. I have talked with Dr. Johney Frame (DOD) here today - will stop her Maxzide. Needs to up her salt intake. Check D dimer. Start low dose beta blocker. Will need to get her back to see Dr. Graciela Husbands soon. Needs a full echo per Dr. Johney Frame. I have given her the ok to resume her PT - she does appear deconditioned to me.     Patient is agreeable to this plan and will call if any problems develop in the interim.   Rosalio Macadamia, RN, ANP-C [facility] [address] [place], [place]  [address]

## 2013-01-21 NOTE — Progress Notes (Signed)
Pt being seen by Lawson Fiscal for followup to ER visit. Normal pacer check with no changes made. Stitch removed from wound. Site healed with no redness or swelling. Follow up as planned.

## 2013-01-21 NOTE — ED Notes (Signed)
Pt reports she had a echocardiogram and some blood work completed today and Dr. Marikay Alar called her today informing her to come up to the ED to have a CT angio to rule out a possible PE after receiving the results of the blood test.

## 2013-01-21 NOTE — Consult Note (Signed)
Cardiology Consult Note   Patient ID: [name] MRN: [id number], DOB/AGE: 1946-01-17   Admit date: 01/20/2013 Date of Consult: 01/21/2013  Primary Physician: Minda Meo, MD Primary Cardiologist: Drs. Klein/Taylor [facility]  Reason for consult:  Chest pain, SOB  HPI: [name] is a 67 y.o. female with c/o ongoing sporadic atypical chest discomfort, exertional dyspnea, orthopnea x weeks but somewhat more noticeable over the last 3 days who is s/p dual chamber PPM implantation in August 2014 complicated by RV microperforation and small pericardial effusion for which PPM revision was performed but pericardiocentesis was not indicated/required.  Patient known to have normal coronary arteries per cardiac catheterization of August 2014 and preserved LVEF.  She states that her HR increases every time she gets up and walks around at home and some of the rates have reportedly been in the 120-130 bpm range although she has not had any such tachycardia in the ED.  Patient describes her dyspnea as having to "take deep breaths to catch my breath" and her chest pain is stabbing in nature and sporadic.  She currently has no chest pain in the ED.  Initial troponin was negative.  CXR shows no cardiomegaly, pulmonary edema, pneumothorax, infiltrates or mediastinal widening.  Vitals are within normal ranges in the ED.  Patient has no conversational dyspnea and describes her symptoms with frustration.  Her husband is at the bedside and reinforced the patient's concerns.  Of note, patient denies fevers/chills/rigors/new or worsening cough/dysuria/pain at pacemaker site/bleeding.  She reports no changes in her po intake or sleep.  Problem List: Past Medical History  Diagnosis Date  . Diverticulosis   . GERD (gastroesophageal reflux disease)   . Esophageal stricture   . Hemorrhoids   . Mallory - Weiss tear   . Interstitial cystitis   . ADHD (attention deficit hyperactivity disorder)     . Breast cancer     right side  . Arthritis   . Blood transfusion   . Thyroid disease   . Allergy     trees/pollen, mold, fungus, dust mites. Takes allergy shots  . Latex allergy, contact dermatitis   . Cataract   . Depression   . Hernia of abdominal wall     spigelian hernia RLQ  . H/O hiatal hernia   . Neuromuscular disorder     central nervous system neuropathy- seen per Dr Sandria Manly  . Asthma     allergist Dr Illene Labrador- monthly allergy injections  . Complication of anesthesia     reaction to some anesthetics/ 7/12 anesth record on chart- states prefers epidural  . PONV (postoperative nausea and vomiting)     pt needs scop patch  . Recurrent upper respiratory infection (URI) 1/13- to present    bronchitis following surgery- states improved but still with cough. OV with [facility] Dr [name] 09/06/11 on chart  . Symptomatic bradycardia     a. 12/2012 s/p MDT dual chamber PPM, ser # [id number]; b. 12/2012 post-op course complicated by pericardial effusinon req lead revision.  . Pericardial effusion     a. 12/2012 following ppm placement;  b. 01/01/2013 Echo: EF 55-60%, small pericardial effusion w/o RV collapse-->No need for tap/window.  . Chest pain     a. 12/2012 Cath: nl cors, EF 55-65%.    Past Surgical History  Procedure Laterality Date  . Mastectomy modified radical      right; with immediate reconstruction  . Laparoscopy  1973  . Abdominal hysterectomy  1974  . Cystoscopy  1975, 2007  . Myringoplasty  1962  . Tympanoplasty  1973    right  . Oophorectomy  1982  . Bladder suspension    . Rectocele repair      with cystocele repair  . Total hip arthroplasty    . Ventral hernia repair  05/30/2011    Procedure: HERNIA REPAIR VENTRAL ADULT;  Surgeon: Currie Paris, MD;  Location: [facility];  Service: General;  Laterality: Right;  repair right spigelian hernia  . Breast biopsy  2002    NO BLOOD PRESSURES ON RIGHT SIDE/   s/p  axillary node dissection  .  Tonsillectomy    . Back surgery      cervical fusion 4-5 with plate  . Brain surgery      bilateral lasik  . Cysto with hydrodistension  09/07/2011    Procedure: CYSTOSCOPY/HYDRODISTENSION;  Surgeon: Kathi Ludwig, MD;  Location: [facility];  Service: Urology;  Laterality: N/A;  INSTILLATION OF MARCAINE/PYRIDIUM INSTILLATION OF MARCAINE/KENALOG  . Pacemaker insertion       Allergies:  Allergies  Allergen Reactions  . Anesthetics, Amide Other (See Comments)    Patient unsure of names, however multiples cause swelling of airway & nausea  . Sulfonamide Derivatives Anaphylaxis and Swelling  . Azithromycin Other (See Comments)    Severe gastritis   . Bactroban Other (See Comments)    Causes sores in nose  . Ciprofloxacin     REACTION: joint swelling  . Codeine Swelling  . Meperidine Hcl Nausea Only    Hallucinations   . Morphine Nausea Only    Hallucinations   . Neosporin [Neomycin-Polymyxin-Gramicidin] Hives and Dermatitis    All topical "orin's ointment"  . Nitrofurantoin     REACTION: neuropathy in legs  . Penicillins Other (See Comments)    Swelling in joints  . Latex Rash    Home Medications: Prior to Admission medications   Medication Sig Start Date End Date Taking? Authorizing Provider  albuterol (PROVENTIL HFA;VENTOLIN HFA) 108 (90 BASE) MCG/ACT inhaler Inhale 2 puffs into the lungs every 6 (six) hours as needed. For shortness of breath   Yes Historical Provider, MD  Ascorbic Acid (VITAMIN C) 100 MG tablet Take 100 mg by mouth every morning.    Yes Historical Provider, MD  b complex vitamins capsule Take 1 capsule by mouth daily.    Yes Historical Provider, MD  buPROPion (WELLBUTRIN SR) 150 MG 12 hr tablet Take 150 mg by mouth daily.    Yes Historical Provider, MD  Calcium Carbonate-Vitamin D (CALTRATE 600+D) 600-400 MG-UNIT per chew tablet Chew 1 tablet by mouth daily.    Yes Historical Provider, MD  cholecalciferol (VITAMIN D) 1000 UNITS tablet Take 1,000 Units by  mouth daily.   Yes Historical Provider, MD  ciclesonide (OMNARIS) 50 MCG/ACT nasal spray Place 2 sprays into both nostrils 2 (two) times daily.   Yes Historical Provider, MD  clonazePAM (KLONOPIN) 1 MG tablet Take 1 mg by mouth at bedtime as needed. For insomnia   Yes Historical Provider, MD  docusate sodium (COLACE) 100 MG capsule Take 200 mg by mouth every evening.    Yes Historical Provider, MD  DULoxetine (CYMBALTA) 20 MG capsule Take 20 mg by mouth daily.   Yes Historical Provider, MD  esomeprazole (NEXIUM) 40 MG capsule Take 40 mg by mouth daily before breakfast.    Yes Historical Provider, MD  estradiol (ESTRING) 2 MG vaginal ring Place 2 mg vaginally every 3 (three) months. follow package directions  Yes Historical Provider, MD  estradiol (VIVELLE-DOT) 0.025 MG/24HR Place 1 patch onto the skin 2 (two) times a week. Applies new patch on Sunday & Thursday   Yes Historical Provider, MD  famotidine (PEPCID AC) 10 MG chewable tablet Chew 10 mg by mouth at bedtime.   Yes Historical Provider, MD  fexofenadine (ALLEGRA) 180 MG tablet Take 180 mg by mouth daily.    Yes Historical Provider, MD  fish oil-omega-3 fatty acids 1000 MG capsule Take 1 g by mouth daily.    Yes Historical Provider, MD  levothyroxine (SYNTHROID, LEVOTHROID) 112 MCG tablet Take 112 mcg by mouth every morning.    Yes Historical Provider, MD  magnesium 30 MG tablet Take 30 mg by mouth daily with lunch.    Yes Historical Provider, MD  mometasone-formoterol (DULERA) 100-5 MCG/ACT AERO Inhale 2 puffs into the lungs every morning.    Yes Historical Provider, MD  montelukast (SINGULAIR) 10 MG tablet Take 10 mg by mouth every evening.    Yes Historical Provider, MD  POTASSIUM PO Take 1 tablet by mouth every morning. OTC Potassium   Yes Historical Provider, MD  Probiotic Product (ALIGN) 4 MG CAPS Take 4 mg by mouth at bedtime.    Yes Historical Provider, MD  triamterene-hydrochlorothiazide (MAXZIDE) 75-50 MG per tablet Take 1 tablet by  mouth every morning.    Yes Historical Provider, MD  vitamin B-12 (CYANOCOBALAMIN) 100 MCG tablet Take 50 mcg by mouth every evening.    Yes Historical Provider, MD  pentosan polysulfate (ELMIRON) 100 MG capsule Take 100 mg by mouth as needed.     Historical Provider, MD    Inpatient Medications:    (Not in a hospital admission)  Family History  Problem Relation Age of Onset  . Heart disease Father   . Diabetes      aunt  . Uterine cancer      grandmother  . Colon cancer Mother   . Depression Mother   . Cancer Mother     colon  . Pancreatic cancer Sister   . Cancer Sister     pancreatic  . Cancer Maternal Grandmother     ovarian     History   Social History  . Marital Status: Married    Spouse Name: N/A    Number of Children: 2  . Years of Education: N/A   Occupational History  . vp [employer]   .     Social History Main Topics  . Smoking status: Never Smoker   . Smokeless tobacco: Never Used  . Alcohol Use: 4.2 oz/week    7 Glasses of wine per week     Comment: nightly  . Drug Use: No  . Sexual Activity: Not on file   Other Topics Concern  . Not on file   Social History Narrative  . No narrative on file     Review of Systems: All other systems reviewed and are otherwise negative except as noted above.  Physical Exam: Blood pressure 125/74, pulse 68, temperature 97.6 F (36.4 C), temperature source Oral, resp. rate 18, height 5\' 6"  (1.676 m), weight 58.514 kg (129 lb), SpO2 98.00%.   General: Well developed, well nourished, in no acute distress. Head: Normocephalic, atraumatic, sclera non-icteric, no xanthomas, nares are without discharge.  Neck: Negative for carotid bruits. JVD not elevated. Chest/Lungs: Clear bilaterally to auscultation without wheezes, rales, or rhonchi. Breathing is unlabored.  PPM site nontender, no erythema, no fluctuance. Heart: RRR with S1 S2.  No murmurs, rubs, or gallops appreciated. Abdomen: Soft,  non-tender, non-distended with normoactive bowel sounds. No hepatomegaly. No rebound/guarding. No obvious abdominal masses. Msk:   Strength and tone appears normal for age. Extremities: No clubbing, cyanosis or edema.  Distal pedal pulses are 2+ and equal bilaterally. Neuro: Alert and oriented X 3. Moves all extremities spontaneously. Psych:  Responds to questions appropriately with a normal affect.  Labs: Recent Labs     01/20/13  2028  HGB  14.6  HCT  43.0   No results found for this basename: VITAMINB12, FOLATE, FERRITIN, TIBC, IRON, RETICCTPCT,  in the last 72 hours No results found for this basename: DDIMER,  in the last 72 hours  Recent Labs Lab 01/20/13 2028  NA 143  K 3.8  CL 104  BUN 17  CREATININE 1.00  GLUCOSE 80   No results found for this basename: HGBA1C,  in the last 72 hours No results found for this basename: CKTOTAL, CKMB, CKMBINDEX, TROPONINI,  in the last 72 hours No components found with this basename: POCBNP,  No results found for this basename: CHOL, HDL, LDLCALC, TRIG, CHOLHDL, LDLDIRECT,  in the last 72 hours No results found for this basename: TSH, T4TOTAL, FREET3, T3FREE, THYROIDAB,  in the last 72 hours  Radiology/Studies: Dg Chest 2 View  01/20/2013   CLINICAL DATA:  Shortness of breath. Chest pain. Asthma. Breast carcinoma.  EXAM: CHEST  2 VIEW  COMPARISON:  01/02/2013  FINDINGS: The heart size and mediastinal contours are within normal limits. Both lungs are clear. Dual lead transvenous pacemaker remains in appropriate position. Surgical clips again seen in the right axilla.  IMPRESSION: Stable exam. No active cardiopulmonary disease.   Electronically Signed   By: Myles Rosenthal   On: 01/20/2013 20:57   Dg Chest 2 View  01/02/2013   *RADIOLOGY REPORT*  Clinical Data: Cough, history asthma, question pneumonia  CHEST - 2 VIEW  Comparison: 01/01/2013  Findings: Left subclavian transvenous pacemaker leads project at right atrium and right ventricle. Normal  heart size, mediastinal contours, and pulmonary vascularity. Emphysematous changes with biapical scarring asymmetrically greater on the right, unchanged. Minimal blunting of the posterior costophrenic angles question tiny effusions. No acute infiltrate or pneumothorax. Prior cervical spine fusion. Post right mastectomy and axillary lymph node dissection.  IMPRESSION: Emphysematous changes with biapical scarring greater on the right. Tiny bilateral pleural effusions.   Original Report Authenticated By: Ulyses Southward, M.D.   Dg Chest 2 View  12/31/2012   *RADIOLOGY REPORT*  Clinical Data: Status post pacemaker lead revision.  CHEST - 2 VIEW  Comparison: 12/30/2012.  Findings: Normal heart size with clear lung fields.  No bony abnormality.  Dual lead permanent transvenous pacer from left subclavian approach appears satisfactory. No pneumothorax.  Right axillary node dissection. Right breast implant.  No osseous findings.   Prior cervical fusion.  IMPRESSION: No active cardiopulmonary disease. Stable appearance from priors.   Original Report Authenticated By: Davonna Belling, M.D.   Dg Chest 2 View  12/30/2012   *RADIOLOGY REPORT*  Clinical Data: Continued pleuritic pain after implant.  CHEST - 2 VIEW  Comparison: 12/29/2012  Findings: Stable appearance of cardiac pacemaker.  Postoperative changes in the cervical spine and right axilla.  Normal heart size and pulmonary vascularity.  No focal consolidation or airspace disease.  No pleural thickening.  No pneumothorax.  Mild degenerative changes in the spine.  No significant change since previous study.  IMPRESSION: No evidence of active pulmonary disease.   Original Report  Authenticated By: Burman Nieves, M.D.   Dg Chest 2 View  12/26/2012   *RADIOLOGY REPORT*  Clinical Data: 67 year old female with pain.  CHEST - 2 VIEW  Comparison: 08/31/2011 and earlier.  Findings: Upright AP and lateral views of the chest.  Postoperative changes to the right chest wall and axilla  re-identified.  Slightly lower lung volumes.  Stable and negative cardiac mediastinal contours.  No pneumothorax, pulmonary edema, pleural effusion or confluent pulmonary opacity. No acute osseous abnormality identified.  Cervical ACDF hardware partially visible.  IMPRESSION: No acute cardiopulmonary abnormality.   Original Report Authenticated By: Erskine Speed, M.D.   Dg Chest Port 1 View  01/01/2013   *RADIOLOGY REPORT*  Clinical Data: Atrial lead dislodgement  PORTABLE CHEST - 1 VIEW  Comparison: 12/31/2012  Findings: Heart size and vascular pattern are normal.  Lungs are clear.  Two lead cardiac pacer again identified with generator over the left thorax.  Position of leads unchanged when compared to prior study.  IMPRESSION: No acute abnormalities.   Original Report Authenticated By: Esperanza Heir, M.D.   Dg Chest Port 1 View  12/29/2012   *RADIOLOGY REPORT*  Clinical Data: Pain when breathing post pacer.  PORTABLE CHEST - 1 VIEW  Comparison: 12/26/2012  Findings: Interval placement of cardiac pacemaker with lead tips over the RA and RV regions.  No pneumothorax.  Normal heart size and pulmonary vascularity.  No focal consolidation or airspace disease in the lungs.  No blunting of costophrenic angles.  No pneumothorax.  Mediastinal contours appear intact.  Postoperative changes in the right axilla. Postoperative changes in the cervical spine.  IMPRESSION: Interval placement cardiac pacemaker.  No evidence of active pulmonary disease.  No pneumothorax.   Original Report Authenticated By: Burman Nieves, M.D.    EKG:  SR with RBBB pattern previously noted.  ASSESSMENT:  67 yo WF with c/o ongoing sporadic atypical chest discomfort, exertional dyspnea, orthopnea who is s/p dual chamber PPM implantation in August 2014 complicated by RV microperforation and small pericardial effusion for which PPM revision was performed but pericardiocentesis was not indicated/required.  Patient known to have normal  coronary arteries per cardiac catheterization of August 2014 and preserved LVEF.  Now with no objective findings of pericardial tamponade or significant pericardial effusion, no classical ECG findings of pericarditis and no evidence of ACS or decompensated CHF.  She is hemodynamically in NSR and normotensive with normal oxygenation.  IMPRESSIONS/RECOMMENDATIONS:  1-Bedside limited portable U/S shows trace pericardial effusion and normal LV/RV systolic function thus no evidence of significant pericardial effusion.  CXR with no cardiomegaly which further reinforces absence of any significant effusion.  CXR also shows PPM leads in appropriate orientation. 2-Lab evaluation and ECG without acute findings.  BMP re-check ordered as Creatinine is 1.0 and was 0.5-0.6 in August hospitalization but this does not meet criteria for AKI.  Electrolytes are stable. 3-No hemodynamic aberrations on vital signs. 4-Recent PPM interrogation in the office without remarkable notes/alarms. 5-Possibility of pericarditis certainly exists and patient may take Ibuprofen BID for pain (provided that GFR is still within normal range on repeat BMP pending in ED) to see if this helps to relieve symptoms.   6-Offered discharge from ED with outpatient follow-up in [facility] tomorrow versus inpatient observation stay for overnight monitoring and patient elected to be discharged home.  Will convey need for office evaluation and check-in tomorrow with either Drs. Klein/Taylor and/or their physician-extenders (PA/NP's).  Signed, Christie Nottingham, MD Cardiology [facility]  01/21/2013,  12:26 AM

## 2013-01-21 NOTE — ED Notes (Signed)
Pt was suppose to have outpatient CT

## 2013-01-21 NOTE — Patient Instructions (Addendum)
I am stopping your Maxzide  Increase your salt intake  Ok to go back to PT  I am putting you on Toprol XL 25 mg a day  We need to check lab today - based on this lab - may need CT chest   See Dr. Graciela Husbands next week - before next Thursday  Call the [facility] office at [phone] if you have any questions, problems or concerns.

## 2013-01-21 NOTE — Progress Notes (Signed)
Limited Echocardiogram performed for Pericardial Effusion.  

## 2013-01-22 ENCOUNTER — Encounter: Payer: Self-pay | Admitting: Internal Medicine

## 2013-01-22 ENCOUNTER — Telehealth: Payer: Self-pay | Admitting: Internal Medicine

## 2013-01-22 NOTE — Telephone Encounter (Signed)
Spoke with Pt we discussed her concerns about the word shock which had been used both by Dr. Nydia Bouton in by her psychiatrist. I discussed this is related to pericardial effusion. We discussed further in the impact that we have seen with a resumption of her Adderall, not withstanding the comment that her psychiatrist that it  didn't relate. She expressed some frustration at which she was seen mostly by PAs in the 2 days after a left.she reports having been told that her heart rate changed rapidly with small changes in activity both in the hospital and in the emergency room the other night. Review of records vital signs does not record his perturbations. Blood pressure vital signs following reinitiation after all for the last 48 hours of her hospitalization all demonstrated normal blood pressure and normal heart rate  She describes chest pain shortness of breath, and and not insignificantly postprandial epigastric discomfort. There has been dyspnea on exertion and tachycardia palpitations and lightheadedness with exertion. She was noted in hospital yesterday to have a potassium of 3.1. We reviewed the imaging studies related to pulmonary embolism and pericardial effusion. I looked at the scout film and did not see evidence of a hiatal hernia and there is no comment about such on her x-ray reports.  We will plan to replete her potassium. We'll see her next week with orthostatic assessments.

## 2013-01-22 NOTE — Telephone Encounter (Signed)
Need to know the results of the scan that done last night.  Also has question about meds.

## 2013-01-23 ENCOUNTER — Telehealth: Payer: Self-pay | Admitting: *Deleted

## 2013-01-23 ENCOUNTER — Other Ambulatory Visit: Payer: Self-pay | Admitting: *Deleted

## 2013-01-23 MED ORDER — POTASSIUM CHLORIDE ER 10 MEQ PO TBCR: 10.0000 meq | EXTENDED_RELEASE_TABLET | Freq: Every day | ORAL | Status: DC

## 2013-01-23 NOTE — Telephone Encounter (Signed)
Let patient husband know that I sent in prescription for Potassium 10mg  daily and confirmed appointment with Dr. Graciela Husbands for next week

## 2013-01-27 ENCOUNTER — Other Ambulatory Visit (INDEPENDENT_AMBULATORY_CARE_PROVIDER_SITE_OTHER): Payer: Medicare Other

## 2013-01-27 ENCOUNTER — Ambulatory Visit (INDEPENDENT_AMBULATORY_CARE_PROVIDER_SITE_OTHER): Payer: Medicare Other | Admitting: Internal Medicine

## 2013-01-27 ENCOUNTER — Encounter: Payer: Self-pay | Admitting: Internal Medicine

## 2013-01-27 VITALS — BP 96/64 | HR 65

## 2013-01-27 DIAGNOSIS — I309 Acute pericarditis, unspecified: Secondary | ICD-10-CM | POA: Diagnosis not present

## 2013-01-27 DIAGNOSIS — I951 Orthostatic hypotension: Secondary | ICD-10-CM | POA: Diagnosis not present

## 2013-01-27 DIAGNOSIS — R001 Bradycardia, unspecified: Secondary | ICD-10-CM

## 2013-01-27 DIAGNOSIS — R000 Tachycardia, unspecified: Secondary | ICD-10-CM

## 2013-01-27 DIAGNOSIS — I498 Other specified cardiac arrhythmias: Secondary | ICD-10-CM

## 2013-01-27 DIAGNOSIS — J309 Allergic rhinitis, unspecified: Secondary | ICD-10-CM | POA: Diagnosis not present

## 2013-01-27 DIAGNOSIS — T82190A Other mechanical complication of cardiac electrode, initial encounter: Secondary | ICD-10-CM | POA: Insufficient documentation

## 2013-01-27 DIAGNOSIS — I509 Heart failure, unspecified: Secondary | ICD-10-CM | POA: Diagnosis not present

## 2013-01-27 DIAGNOSIS — I441 Atrioventricular block, second degree: Secondary | ICD-10-CM

## 2013-01-27 LAB — PACEMAKER DEVICE OBSERVATION
AL AMPLITUDE: 2.25 mv
AL IMPEDENCE PM: 418 Ohm
BAMS-0001: 150 {beats}/min
BATTERY VOLTAGE: 3.0671 V
RV LEAD AMPLITUDE: 5.625 mv
RV LEAD IMPEDENCE PM: 627 Ohm

## 2013-01-27 LAB — BASIC METABOLIC PANEL
BUN: 15 mg/dL (ref 6–23)
Calcium: 9 mg/dL (ref 8.4–10.5)
Creatinine, Ser: 0.6 mg/dL (ref 0.4–1.2)
GFR: 103.51 mL/min (ref >60.00)

## 2013-01-27 NOTE — Patient Instructions (Addendum)
Your physician recommends that you continue on your current medications as directed. Please refer to the Current Medication list given to you today.  Your physician recommends that keep your scheduled follow-up appointment on 02/13/13  Your physician recommends that you have lab work today: [employer]

## 2013-01-27 NOTE — Progress Notes (Signed)
Patient Care Team: Minda Meo, MD as PCP - General (Internal Medicine)   HPI  [name] is a 67 y.o. female Seen following pacemaker implantation for second degree heart block and a higher degree heart block symptomatic. Her postoperative course was complicated chest pain presumed secondary to the perforation. She underwent lead repositioning of the next a continue to have symptoms of chest pain  tachycardia hypotension and modest pericardial effusion was evident. Symptoms seemed  to   largely have abated with the reintroduction of her Adderall. When she left the hospital she chose not to take any further.  This had problems since going home with tachypalpitations dyspnea and chest pain. It undergone extensive evaluation including a CT scan that was negative for PE, echo that demonstrated only minimal effusion; only function was normal  She comes in today with complaints of weakness and orthostatic tachycardia;  She notes that the history of orthostatic hypotension   Past Medical History  Diagnosis Date  . Diverticulosis   . GERD (gastroesophageal reflux disease)   . Esophageal stricture   . Hemorrhoids   . Mallory - Weiss tear   . Interstitial cystitis   . ADHD (attention deficit hyperactivity disorder)   . Breast cancer     right side  . Arthritis   . Blood transfusion   . Thyroid disease   . Allergy     trees/pollen, mold, fungus, dust mites. Takes allergy shots  . Latex allergy, contact dermatitis   . Cataract   . Depression   . Hernia of abdominal wall     spigelian hernia RLQ  . H/O hiatal hernia   . Neuromuscular disorder     central nervous system neuropathy- seen per Dr Sandria Manly  . Asthma     allergist Dr Illene Labrador- monthly allergy injections  . Complication of anesthesia     reaction to some anesthetics/ 7/12 anesth record on chart- states prefers epidural  . PONV (postoperative nausea and vomiting)     pt needs scop patch  . Recurrent upper respiratory  infection (URI) 1/13- to present    bronchitis following surgery- states improved but still with cough. OV with [facility] Dr [name] 09/06/11 on chart  . Symptomatic bradycardia     a. 12/2012 s/p MDT dual chamber PPM, ser # [id number]; b. 12/2012 post-op course complicated by pericardial effusinon req lead revision.  . Pericardial effusion     a. 12/2012 following ppm placement;  b. 01/01/2013 Echo: EF 55-60%, small pericardial effusion w/o RV collapse-->No need for tap/window.  . Chest pain     a. 12/2012 Cath: nl cors, EF 55-65%.    Past Surgical History  Procedure Laterality Date  . Mastectomy modified radical      right; with immediate reconstruction  . Laparoscopy  1973  . Abdominal hysterectomy  1974  . Cystoscopy  1975, 2007  . Myringoplasty  1962  . Tympanoplasty  1973    right  . Oophorectomy  1982  . Bladder suspension    . Rectocele repair      with cystocele repair  . Total hip arthroplasty    . Ventral hernia repair  05/30/2011    Procedure: HERNIA REPAIR VENTRAL ADULT;  Surgeon: Currie Paris, MD;  Location: [facility];  Service: General;  Laterality: Right;  repair right spigelian hernia  . Breast biopsy  2002    NO BLOOD PRESSURES ON RIGHT SIDE/   s/p  axillary node dissection  . Tonsillectomy    .  Back surgery      cervical fusion 4-5 with plate  . Brain surgery      bilateral lasik  . Cysto with hydrodistension  09/07/2011    Procedure: CYSTOSCOPY/HYDRODISTENSION;  Surgeon: Kathi Ludwig, MD;  Location: [facility];  Service: Urology;  Laterality: N/A;  INSTILLATION OF MARCAINE/PYRIDIUM INSTILLATION OF MARCAINE/KENALOG  . Pacemaker insertion      Current Outpatient Prescriptions  Medication Sig Dispense Refill  . albuterol (PROVENTIL HFA;VENTOLIN HFA) 108 (90 BASE) MCG/ACT inhaler Inhale 2 puffs into the lungs every 6 (six) hours as needed. For shortness of breath      . Ascorbic Acid (VITAMIN C) 100 MG tablet Take 100 mg by mouth every  morning.       Marland Kitchen b complex vitamins capsule Take 1 capsule by mouth daily.       Marland Kitchen buPROPion (WELLBUTRIN SR) 150 MG 12 hr tablet Take 150 mg by mouth daily.       . Calcium Carbonate-Vitamin D (CALTRATE 600+D) 600-400 MG-UNIT per chew tablet Chew 1 tablet by mouth daily.       . cholecalciferol (VITAMIN D) 1000 UNITS tablet Take 1,000 Units by mouth daily.      . ciclesonide (OMNARIS) 50 MCG/ACT nasal spray Place 2 sprays into both nostrils 2 (two) times daily.      . clonazePAM (KLONOPIN) 1 MG tablet Take 1 mg by mouth at bedtime as needed. For insomnia      . docusate sodium (COLACE) 100 MG capsule Take 200 mg by mouth every evening.       . DULoxetine (CYMBALTA) 20 MG capsule Take 20 mg by mouth daily.      Marland Kitchen esomeprazole (NEXIUM) 40 MG capsule Take 40 mg by mouth daily before breakfast.       . estradiol (ESTRING) 2 MG vaginal ring Place 2 mg vaginally every 3 (three) months. follow package directions      . estradiol (VIVELLE-DOT) 0.025 MG/24HR Place 1 patch onto the skin 2 (two) times a week. Applies new patch on Sunday & Thursday      . famotidine (PEPCID AC) 10 MG chewable tablet Chew 10 mg by mouth at bedtime.      . fexofenadine (ALLEGRA) 180 MG tablet Take 180 mg by mouth daily.       . fish oil-omega-3 fatty acids 1000 MG capsule Take 1 g by mouth daily.       Marland Kitchen levothyroxine (SYNTHROID, LEVOTHROID) 112 MCG tablet Take 112 mcg by mouth every morning.       . magnesium 30 MG tablet Take 30 mg by mouth daily with lunch.       . metoprolol succinate (TOPROL XL) 25 MG 24 hr tablet Take 1 tablet (25 mg total) by mouth daily.  30 tablet  6  . mometasone-formoterol (DULERA) 100-5 MCG/ACT AERO Inhale 2 puffs into the lungs every morning.       . montelukast (SINGULAIR) 10 MG tablet Take 10 mg by mouth every evening.       . pentosan polysulfate (ELMIRON) 100 MG capsule Take 100 mg by mouth as needed.       . potassium chloride (K-DUR) 10 MEQ tablet Take 1 tablet (10 mEq total) by mouth  daily.  30 tablet  3  . POTASSIUM PO Take 1 tablet by mouth every morning. OTC Potassium      . Probiotic Product (ALIGN) 4 MG CAPS Take 4 mg by mouth at bedtime.  No current facility-administered medications for this visit.   Facility-Administered Medications Ordered in Other Visits  Medication Dose Route Frequency Provider Last Rate Last Dose  . bupivacaine (MARCAINE) 0.5 % 10 mL, triamcinolone acetonide (KENALOG-40) 40 mg injection   Subcutaneous Once Kathi Ludwig, MD      . bupivacaine (MARCAINE) 0.5 % 15 mL, phenazopyridine (PYRIDIUM) 400 mg bladder mixture   Bladder Instillation Once Kathi Ludwig, MD        Allergies  Allergen Reactions  . Anesthetics, Amide Other (See Comments)    Patient unsure of names, however multiples cause swelling of airway & nausea  . Sulfonamide Derivatives Anaphylaxis and Swelling  . Azithromycin Other (See Comments)    Severe gastritis   . Bactroban Other (See Comments)    Causes sores in nose  . Ciprofloxacin     REACTION: joint swelling  . Codeine Swelling  . Meperidine Hcl Nausea Only    Hallucinations   . Morphine Nausea Only    Hallucinations   . Neosporin [Neomycin-Polymyxin-Gramicidin] Hives and Dermatitis    All topical "orin's ointment"  . Nitrofurantoin     REACTION: neuropathy in legs  . Penicillins Other (See Comments)    Swelling in joints  . Latex Rash    Review of Systems negative except from HPI and PMH  Physical Exam BP 96/64  Pulse 65 Well developed and nourished in no acute distress HENT normal Neck supple with JVP-flat Clear Regular rate and rhythm, no murmurs or gallops Abd-soft with active BS No Clubbing cyanosis edema Skin-warm and dry A & Oriented  Grossly normal sensory and motor function    Assessment and  Plan

## 2013-01-27 NOTE — Assessment & Plan Note (Signed)
Crosstalk was noted with resetting of her device. Atrial sensitivity was decreased from 0.3-0.6 mV with a P wave measured 2.3.

## 2013-01-27 NOTE — Assessment & Plan Note (Signed)
resolve

## 2013-01-27 NOTE — Assessment & Plan Note (Addendum)
The patient is complaining of symptomatic sinus tachycardia with exertion. This is much improved with low-dose beta-blockade. It is now apparent that she has a long-standing history of orthostatic hypotension and I presume orthostatic tachycardia the latter which is mitigated by the beta blocker. Her low blood pressure is not seemingly initially issue  We'll continue the beta blocker for now.

## 2013-01-27 NOTE — Assessment & Plan Note (Addendum)
Device reprogrammed A.--D. To avoid ventricular paced

## 2013-01-27 NOTE — Assessment & Plan Note (Signed)
As above She is on numerous psychotropic medications which I suspect are contributing  She will review these with her psychiatrist.  For now, her urine is described as yellow; we will push on volume repletion. Once we have that issue resolves we will undertake sodium urinary excretion assessment

## 2013-01-28 DIAGNOSIS — M169 Osteoarthritis of hip, unspecified: Secondary | ICD-10-CM | POA: Diagnosis not present

## 2013-01-29 ENCOUNTER — Other Ambulatory Visit: Payer: Self-pay | Admitting: Orthopaedic Surgery

## 2013-01-29 DIAGNOSIS — M25552 Pain in left hip: Secondary | ICD-10-CM

## 2013-02-02 ENCOUNTER — Ambulatory Visit
Admission: RE | Admit: 2013-02-02 | Discharge: 2013-02-02 | Disposition: A | Discharge status: Home / Self Care | Payer: Medicare Other | Source: Ambulatory Visit | Attending: Orthopaedic Surgery | Admitting: Orthopaedic Surgery

## 2013-02-02 DIAGNOSIS — M169 Osteoarthritis of hip, unspecified: Secondary | ICD-10-CM | POA: Diagnosis not present

## 2013-02-02 DIAGNOSIS — M25552 Pain in left hip: Secondary | ICD-10-CM

## 2013-02-02 MED ORDER — IOHEXOL 180 MG/ML  SOLN
1.0000 mL | Freq: Once | INTRAMUSCULAR | Status: AC | PRN
  Administered 2013-02-02: 1 mL via INTRA_ARTICULAR

## 2013-02-02 MED ORDER — METHYLPREDNISOLONE ACETATE 40 MG/ML INJ SUSP (RADIOLOG
120.0000 mg | Freq: Once | INTRAMUSCULAR | Status: AC
  Administered 2013-02-02: 120 mg via INTRA_ARTICULAR

## 2013-02-03 ENCOUNTER — Encounter: Payer: Self-pay | Admitting: Internal Medicine

## 2013-02-13 ENCOUNTER — Ambulatory Visit (INDEPENDENT_AMBULATORY_CARE_PROVIDER_SITE_OTHER): Payer: Medicare Other | Admitting: Internal Medicine

## 2013-02-13 ENCOUNTER — Encounter: Payer: Self-pay | Admitting: Internal Medicine

## 2013-02-13 VITALS — BP 98/66 | HR 72 | Ht 66.5 in | Wt 138.6 lb

## 2013-02-13 DIAGNOSIS — I441 Atrioventricular block, second degree: Secondary | ICD-10-CM

## 2013-02-13 DIAGNOSIS — R0602 Shortness of breath: Secondary | ICD-10-CM

## 2013-02-13 DIAGNOSIS — R000 Tachycardia, unspecified: Secondary | ICD-10-CM

## 2013-02-13 DIAGNOSIS — Z950 Presence of cardiac pacemaker: Secondary | ICD-10-CM | POA: Diagnosis not present

## 2013-02-13 DIAGNOSIS — I498 Other specified cardiac arrhythmias: Secondary | ICD-10-CM

## 2013-02-13 DIAGNOSIS — I951 Orthostatic hypotension: Secondary | ICD-10-CM | POA: Diagnosis not present

## 2013-02-13 DIAGNOSIS — J309 Allergic rhinitis, unspecified: Secondary | ICD-10-CM | POA: Diagnosis not present

## 2013-02-13 DIAGNOSIS — R001 Bradycardia, unspecified: Secondary | ICD-10-CM

## 2013-02-13 LAB — PACEMAKER DEVICE OBSERVATION
AL THRESHOLD: 1 V
ATRIAL PACING PM: 1.84
BAMS-0001: 150 {beats}/min
BATTERY VOLTAGE: 3.0465 V
RV LEAD AMPLITUDE: 7.125 mv
RV LEAD IMPEDENCE PM: 722 Ohm
VENTRICULAR PACING PM: 54.45

## 2013-02-13 NOTE — Assessment & Plan Note (Signed)
This is chronic.  ?

## 2013-02-13 NOTE — Patient Instructions (Addendum)
Your physician recommends that you schedule a follow-up appointment in: 8 weeks with Dr. Graciela Husbands.   Your physician would like you to perform a 24 hour urine sodium excretion.

## 2013-02-13 NOTE — Assessment & Plan Note (Signed)
Much improved a low dose beta blocker. Because of the fatigue, however, we will discontinue it and try her on atenolol/nadolol/metoprolol tartrate and see if we can find one that she tolerates better

## 2013-02-13 NOTE — Progress Notes (Signed)
Patient Care Team: Minda Meo, MD as PCP - General (Internal Medicine)   HPI  [name] is a 67 y.o. female Seen following pacemaker implantation for second degree heart block and a higher degree heart block symptomatic. Her postoperative course was complicated chest pain presumed secondary to the perforation. She underwent lead repositioning of the next a continue to have symptoms of chest pain  tachycardia hypotension and modest pericardial effusion was evident. Symptoms seemed  to   largely have abated with the reintroduction of her Adderall. When she left the hospital she chose not to take any further.  This had problems since going home with tachypalpitations dyspnea and chest pain. It undergone extensive evaluation including a CT scan that was negative for PE, echo that demonstrated only minimal effusion; only function was normal  we did start her on low-dose beta blockers as she had a antecedent history of orthostatic intolerance and my impression is that there may have also been some POTS. At her last visit to beta blockers have helped.  She now complains of fatigue which she thinks is worse with her metoprolol.  He continues to have problems with exercise intolerance particularly on the stairs. She has stopped her Adderall.  She was to review her medications with her psychiatrist  Past Medical History  Diagnosis Date  . Diverticulosis   . GERD (gastroesophageal reflux disease)   . Esophageal stricture   . Hemorrhoids   . Mallory - Weiss tear   . Interstitial cystitis   . ADHD (attention deficit hyperactivity disorder)   . Breast cancer     right side  . Arthritis   . Blood transfusion   . Thyroid disease   . Allergy     trees/pollen, mold, fungus, dust mites. Takes allergy shots  . Latex allergy, contact dermatitis   . Cataract   . Depression   . Hernia of abdominal wall     spigelian hernia RLQ  . H/O hiatal hernia   . Neuromuscular disorder    central nervous system neuropathy- seen per Dr Sandria Manly  . Asthma     allergist Dr Illene Labrador- monthly allergy injections  . Complication of anesthesia     reaction to some anesthetics/ 7/12 anesth record on chart- states prefers epidural  . PONV (postoperative nausea and vomiting)     pt needs scop patch  . Recurrent upper respiratory infection (URI) 1/13- to present    bronchitis following surgery- states improved but still with cough. OV with [facility] Dr [name] 09/06/11 on chart  . Symptomatic bradycardia     a. 12/2012 s/p MDT dual chamber PPM, ser # [id number]; b. 12/2012 post-op course complicated by pericardial effusinon req lead revision.  . Pericardial effusion     a. 12/2012 following ppm placement;  b. 01/01/2013 Echo: EF 55-60%, small pericardial effusion w/o RV collapse-->No need for tap/window.  . Chest pain     a. 12/2012 Cath: nl cors, EF 55-65%.    Past Surgical History  Procedure Laterality Date  . Mastectomy modified radical      right; with immediate reconstruction  . Laparoscopy  1973  . Abdominal hysterectomy  1974  . Cystoscopy  1975, 2007  . Myringoplasty  1962  . Tympanoplasty  1973    right  . Oophorectomy  1982  . Bladder suspension    . Rectocele repair      with cystocele repair  . Total hip arthroplasty    . Ventral hernia repair  05/30/2011  Procedure: HERNIA REPAIR VENTRAL ADULT;  Surgeon: Currie Paris, MD;  Location: [facility];  Service: General;  Laterality: Right;  repair right spigelian hernia  . Breast biopsy  2002    NO BLOOD PRESSURES ON RIGHT SIDE/   s/p  axillary node dissection  . Tonsillectomy    . Back surgery      cervical fusion 4-5 with plate  . Brain surgery      bilateral lasik  . Cysto with hydrodistension  09/07/2011    Procedure: CYSTOSCOPY/HYDRODISTENSION;  Surgeon: Kathi Ludwig, MD;  Location: [facility];  Service: Urology;  Laterality: N/A;  INSTILLATION OF MARCAINE/PYRIDIUM INSTILLATION OF MARCAINE/KENALOG   . Pacemaker insertion      Current Outpatient Prescriptions  Medication Sig Dispense Refill  . albuterol (PROVENTIL HFA;VENTOLIN HFA) 108 (90 BASE) MCG/ACT inhaler Inhale 2 puffs into the lungs every 6 (six) hours as needed. For shortness of breath      . Ascorbic Acid (VITAMIN C) 100 MG tablet Take 100 mg by mouth every morning.       Marland Kitchen b complex vitamins capsule Take 1 capsule by mouth daily.       Marland Kitchen buPROPion (WELLBUTRIN SR) 150 MG 12 hr tablet Take 150 mg by mouth daily.       . Calcium Carbonate-Vitamin D (CALTRATE 600+D) 600-400 MG-UNIT per chew tablet Chew 1 tablet by mouth daily.       . cholecalciferol (VITAMIN D) 1000 UNITS tablet Take 1,000 Units by mouth daily.      . ciclesonide (OMNARIS) 50 MCG/ACT nasal spray Place 2 sprays into both nostrils 2 (two) times daily.      . clonazePAM (KLONOPIN) 1 MG tablet Take 1 mg by mouth at bedtime as needed. For insomnia      . docusate sodium (COLACE) 100 MG capsule Take 200 mg by mouth every evening.       . DULoxetine (CYMBALTA) 20 MG capsule Take 20 mg by mouth daily.      Marland Kitchen esomeprazole (NEXIUM) 40 MG capsule Take 40 mg by mouth daily before breakfast.       . estradiol (ESTRING) 2 MG vaginal ring Place 2 mg vaginally every 3 (three) months. follow package directions      . estradiol (VIVELLE-DOT) 0.025 MG/24HR Place 1 patch onto the skin 2 (two) times a week. Applies new patch on Sunday & Thursday      . famotidine (PEPCID AC) 10 MG chewable tablet Chew 10 mg by mouth at bedtime.      . fexofenadine (ALLEGRA) 180 MG tablet Take 180 mg by mouth daily.       . fish oil-omega-3 fatty acids 1000 MG capsule Take 1 g by mouth daily.       Marland Kitchen levothyroxine (SYNTHROID, LEVOTHROID) 112 MCG tablet Take 112 mcg by mouth every morning.       . magnesium 30 MG tablet Take 30 mg by mouth daily with lunch.       . metoprolol succinate (TOPROL XL) 25 MG 24 hr tablet Take 1 tablet (25 mg total) by mouth daily.  30 tablet  6  . mometasone-formoterol  (DULERA) 100-5 MCG/ACT AERO Inhale 2 puffs into the lungs every morning.       . montelukast (SINGULAIR) 10 MG tablet Take 10 mg by mouth every evening.       . pentosan polysulfate (ELMIRON) 100 MG capsule Take 100 mg by mouth as needed.       Marland Kitchen  potassium chloride (K-DUR) 10 MEQ tablet Take 1 tablet (10 mEq total) by mouth daily.  30 tablet  3  . POTASSIUM PO Take 1 tablet by mouth every morning. OTC Potassium      . Probiotic Product (ALIGN) 4 MG CAPS Take 4 mg by mouth at bedtime.        No current facility-administered medications for this visit.   Facility-Administered Medications Ordered in Other Visits  Medication Dose Route Frequency Provider Last Rate Last Dose  . bupivacaine (MARCAINE) 0.5 % 10 mL, triamcinolone acetonide (KENALOG-40) 40 mg injection   Subcutaneous Once Kathi Ludwig, MD      . bupivacaine (MARCAINE) 0.5 % 15 mL, phenazopyridine (PYRIDIUM) 400 mg bladder mixture   Bladder Instillation Once Kathi Ludwig, MD        Allergies  Allergen Reactions  . Anesthetics, Amide Other (See Comments)    Patient unsure of names, however multiples cause swelling of airway & nausea  . Sulfonamide Derivatives Anaphylaxis and Swelling  . Azithromycin Other (See Comments)    Severe gastritis   . Bactroban Other (See Comments)    Causes sores in nose  . Ciprofloxacin     REACTION: joint swelling  . Codeine Swelling  . Meperidine Hcl Nausea Only    Hallucinations   . Morphine Nausea Only    Hallucinations   . Neosporin [Neomycin-Polymyxin-Gramicidin] Hives and Dermatitis    All topical "orin's ointment"  . Nitrofurantoin     REACTION: neuropathy in legs  . Penicillins Other (See Comments)    Swelling in joints  . Latex Rash    Review of Systems negative except from HPI and PMH  Physical Exam BP 98/66  Pulse 72  Ht 5' 6.5" (1.689 m)  Wt 138 lb 9.6 oz (62.869 kg)  BMI 22.04 kg/m2 Well developed and nourished in no acute distress HENT normal Neck  supple with JVP-flat Clear Regular rate and rhythm, no murmurs or gallops Abd-soft with active BS No Clubbing cyanosis edema Skin-warm and dry A & Oriented  Grossly normal sensory and motor function    Assessment and  Plan

## 2013-02-13 NOTE — Assessment & Plan Note (Addendum)
Somewhat improved. We'll undertake a 24-hour urine excretion assessment she thinks that she is salt repletion. However, she is never able to make her urine clear. We'll also anticipate measuring urine volume.  We also discussed the use of vasoactive agents. In the event that she is sodium deplete and volume depleted we will initiate Florinef; if she is replete on the other hand we will consider ProAmatine   Counseled on  this for more than half of our 45 minute visit   She may benefit from treadmill testing

## 2013-02-16 ENCOUNTER — Other Ambulatory Visit: Payer: Medicare Other

## 2013-02-16 ENCOUNTER — Other Ambulatory Visit: Payer: Self-pay | Admitting: *Deleted

## 2013-02-16 DIAGNOSIS — R000 Tachycardia, unspecified: Secondary | ICD-10-CM | POA: Diagnosis not present

## 2013-02-16 DIAGNOSIS — IMO0002 Reserved for concepts with insufficient information to code with codable children: Secondary | ICD-10-CM | POA: Diagnosis not present

## 2013-02-16 DIAGNOSIS — J45909 Unspecified asthma, uncomplicated: Secondary | ICD-10-CM | POA: Diagnosis not present

## 2013-02-16 DIAGNOSIS — J309 Allergic rhinitis, unspecified: Secondary | ICD-10-CM | POA: Diagnosis not present

## 2013-02-16 DIAGNOSIS — R002 Palpitations: Secondary | ICD-10-CM | POA: Diagnosis not present

## 2013-02-16 DIAGNOSIS — R0602 Shortness of breath: Secondary | ICD-10-CM | POA: Diagnosis not present

## 2013-02-16 DIAGNOSIS — R82998 Other abnormal findings in urine: Secondary | ICD-10-CM

## 2013-02-16 DIAGNOSIS — R42 Dizziness and giddiness: Secondary | ICD-10-CM | POA: Diagnosis not present

## 2013-02-17 ENCOUNTER — Encounter: Payer: Self-pay | Admitting: Internal Medicine

## 2013-02-18 ENCOUNTER — Encounter: Payer: Self-pay | Admitting: Internal Medicine

## 2013-02-18 LAB — SODIUM, URINE, 24 HOUR: Sodium, Ur: 89 mmol/L

## 2013-02-19 ENCOUNTER — Encounter: Payer: Self-pay | Admitting: Internal Medicine

## 2013-02-23 ENCOUNTER — Encounter: Payer: Self-pay | Admitting: Internal Medicine

## 2013-02-23 DIAGNOSIS — I959 Hypotension, unspecified: Secondary | ICD-10-CM | POA: Diagnosis not present

## 2013-02-27 DIAGNOSIS — J309 Allergic rhinitis, unspecified: Secondary | ICD-10-CM | POA: Diagnosis not present

## 2013-03-03 DIAGNOSIS — J309 Allergic rhinitis, unspecified: Secondary | ICD-10-CM | POA: Diagnosis not present

## 2013-03-04 ENCOUNTER — Other Ambulatory Visit: Payer: Self-pay

## 2013-03-04 MED ORDER — POTASSIUM CHLORIDE ER 10 MEQ PO TBCR: 10.0000 meq | EXTENDED_RELEASE_TABLET | Freq: Every day | ORAL | Status: DC

## 2013-03-06 DIAGNOSIS — F329 Major depressive disorder, single episode, unspecified: Secondary | ICD-10-CM | POA: Diagnosis not present

## 2013-03-06 DIAGNOSIS — C50919 Malignant neoplasm of unspecified site of unspecified female breast: Secondary | ICD-10-CM | POA: Diagnosis not present

## 2013-03-06 DIAGNOSIS — Z950 Presence of cardiac pacemaker: Secondary | ICD-10-CM | POA: Diagnosis not present

## 2013-03-06 DIAGNOSIS — Z23 Encounter for immunization: Secondary | ICD-10-CM | POA: Diagnosis not present

## 2013-03-06 DIAGNOSIS — I4891 Unspecified atrial fibrillation: Secondary | ICD-10-CM | POA: Diagnosis not present

## 2013-03-06 DIAGNOSIS — J45909 Unspecified asthma, uncomplicated: Secondary | ICD-10-CM | POA: Diagnosis not present

## 2013-03-06 DIAGNOSIS — IMO0002 Reserved for concepts with insufficient information to code with codable children: Secondary | ICD-10-CM | POA: Diagnosis not present

## 2013-03-06 DIAGNOSIS — Z1331 Encounter for screening for depression: Secondary | ICD-10-CM | POA: Diagnosis not present

## 2013-03-06 DIAGNOSIS — K219 Gastro-esophageal reflux disease without esophagitis: Secondary | ICD-10-CM | POA: Diagnosis not present

## 2013-03-09 ENCOUNTER — Other Ambulatory Visit: Payer: Self-pay | Admitting: *Deleted

## 2013-03-09 MED ORDER — ATENOLOL 25 MG PO TABS: 25.0000 mg | ORAL_TABLET | Freq: Every day | ORAL | Status: DC

## 2013-03-19 ENCOUNTER — Other Ambulatory Visit: Payer: Self-pay

## 2013-03-20 DIAGNOSIS — J309 Allergic rhinitis, unspecified: Secondary | ICD-10-CM | POA: Diagnosis not present

## 2013-04-01 ENCOUNTER — Other Ambulatory Visit: Payer: Self-pay

## 2013-04-01 DIAGNOSIS — J309 Allergic rhinitis, unspecified: Secondary | ICD-10-CM | POA: Diagnosis not present

## 2013-04-01 MED ORDER — ATENOLOL 25 MG PO TABS: 25.0000 mg | ORAL_TABLET | Freq: Every day | ORAL | Status: DC

## 2013-04-06 ENCOUNTER — Encounter: Payer: Self-pay | Admitting: Internal Medicine

## 2013-04-06 ENCOUNTER — Encounter: Payer: Medicare Other | Admitting: Internal Medicine

## 2013-04-06 ENCOUNTER — Ambulatory Visit (INDEPENDENT_AMBULATORY_CARE_PROVIDER_SITE_OTHER): Payer: Medicare Other | Admitting: Internal Medicine

## 2013-04-06 VITALS — BP 110/70 | HR 69 | Ht 66.0 in | Wt 139.4 lb

## 2013-04-06 DIAGNOSIS — I498 Other specified cardiac arrhythmias: Secondary | ICD-10-CM

## 2013-04-06 DIAGNOSIS — R0602 Shortness of breath: Secondary | ICD-10-CM

## 2013-04-06 DIAGNOSIS — I951 Orthostatic hypotension: Secondary | ICD-10-CM | POA: Diagnosis not present

## 2013-04-06 DIAGNOSIS — Z950 Presence of cardiac pacemaker: Secondary | ICD-10-CM

## 2013-04-06 DIAGNOSIS — R001 Bradycardia, unspecified: Secondary | ICD-10-CM

## 2013-04-06 DIAGNOSIS — I4891 Unspecified atrial fibrillation: Secondary | ICD-10-CM

## 2013-04-06 DIAGNOSIS — I441 Atrioventricular block, second degree: Secondary | ICD-10-CM

## 2013-04-06 LAB — MDC_IDC_ENUM_SESS_TYPE_INCLINIC
Battery Remaining Longevity: 111 mo
Battery Voltage: 3.03 V
Brady Statistic AP VP Percent: 6.83 %
Brady Statistic AP VS Percent: 0 %
Brady Statistic AS VP Percent: 92.55 %
Brady Statistic RV Percent Paced: 99.39 %
Lead Channel Impedance Value: 494 Ohm
Lead Channel Impedance Value: 589 Ohm
Lead Channel Impedance Value: 646 Ohm
Lead Channel Pacing Threshold Amplitude: 0.5 V
Lead Channel Pacing Threshold Amplitude: 1.125 V
Lead Channel Pacing Threshold Pulse Width: 0.4 ms
Lead Channel Sensing Intrinsic Amplitude: 15.875 mV
Lead Channel Setting Pacing Amplitude: 2 V
Lead Channel Setting Sensing Sensitivity: 0.9 mV
Zone Setting Detection Interval: 400 ms
Zone Setting Detection Interval: 400 ms

## 2013-04-06 MED ORDER — ATENOLOL 25 MG PO TABS: ORAL_TABLET | ORAL | Status: DC

## 2013-04-06 NOTE — Progress Notes (Signed)
kf      Patient Care Team: Minda Meo, MD as PCP - General (Internal Medicine)   HPI  [name] is a 67 y.o. female Seen in followup for high grade heart block for which she underwent pacing compllicated by microperforation modest effusion and then adderall withdrawal  She continues to complain of exercise tolerance assoc with climbing stairs particiularly and is not improved with betablockers; in fact she says she is worse over the last weeks.  Her husband has noted her HR to be 110 or so on climbing stairs.    She has hx of orthostatic intolerance teh cause of which has never been clarified  Past Medical History  Diagnosis Date  . Diverticulosis   . GERD (gastroesophageal reflux disease)   . Esophageal stricture   . Hemorrhoids   . Mallory - Weiss tear   . Interstitial cystitis   . ADHD (attention deficit hyperactivity disorder)   . Breast cancer     right side  . Arthritis   . Blood transfusion   . Thyroid disease   . Allergy     trees/pollen, mold, fungus, dust mites. Takes allergy shots  . Latex allergy, contact dermatitis   . Cataract   . Depression   . Hernia of abdominal wall     spigelian hernia RLQ  . H/O hiatal hernia   . Neuromuscular disorder     central nervous system neuropathy- seen per Dr Sandria Manly  . Asthma     allergist Dr Illene Labrador- monthly allergy injections  . Complication of anesthesia     reaction to some anesthetics/ 7/12 anesth record on chart- states prefers epidural  . PONV (postoperative nausea and vomiting)     pt needs scop patch  . Recurrent upper respiratory infection (URI) 1/13- to present    bronchitis following surgery- states improved but still with cough. OV with Clearance Dr [name] 09/06/11 on chart  . Symptomatic bradycardia     a. 12/2012 s/p MDT dual chamber PPM, ser # [id number]; b. 12/2012 post-op course complicated by pericardial effusinon req lead revision.  . Pericardial effusion     a. 12/2012 following ppm  placement;  b. 01/01/2013 Echo: EF 55-60%, small pericardial effusion w/o RV collapse-->No need for tap/window.  . Chest pain     a. 12/2012 Cath: nl cors, EF 55-65%.    Past Surgical History  Procedure Laterality Date  . Mastectomy modified radical      right; with immediate reconstruction  . Laparoscopy  1973  . Abdominal hysterectomy  1974  . Cystoscopy  1975, 2007  . Myringoplasty  1962  . Tympanoplasty  1973    right  . Oophorectomy  1982  . Bladder suspension    . Rectocele repair      with cystocele repair  . Total hip arthroplasty    . Ventral hernia repair  05/30/2011    Procedure: HERNIA REPAIR VENTRAL ADULT;  Surgeon: Currie Paris, MD;  Location: [facility];  Service: General;  Laterality: Right;  repair right spigelian hernia  . Breast biopsy  2002    NO BLOOD PRESSURES ON RIGHT SIDE/   s/p  axillary node dissection  . Tonsillectomy    . Back surgery      cervical fusion 4-5 with plate  . Brain surgery      bilateral lasik  . Cysto with hydrodistension  09/07/2011    Procedure: CYSTOSCOPY/HYDRODISTENSION;  Surgeon: Kathi Ludwig, MD;  Location: [facility];  Service: Urology;  Laterality: N/A;  INSTILLATION OF MARCAINE/PYRIDIUM INSTILLATION OF MARCAINE/KENALOG  . Pacemaker insertion      Current Outpatient Prescriptions  Medication Sig Dispense Refill  . albuterol (PROVENTIL HFA;VENTOLIN HFA) 108 (90 BASE) MCG/ACT inhaler Inhale 2 puffs into the lungs every 6 (six) hours as needed. For shortness of breath      . Ascorbic Acid (VITAMIN C) 100 MG tablet Take 100 mg by mouth every morning.       Marland Kitchen atenolol (TENORMIN) 25 MG tablet Take one tablet (25 mg total) by mouth every other day  15 tablet  6  . b complex vitamins capsule Take 1 capsule by mouth daily.       Marland Kitchen buPROPion (WELLBUTRIN SR) 150 MG 12 hr tablet Take 150 mg by mouth daily.       . Calcium Carbonate-Vitamin D (CALTRATE 600+D) 600-400 MG-UNIT per chew tablet Chew 1 tablet by mouth  daily.       . cholecalciferol (VITAMIN D) 1000 UNITS tablet Take 1,000 Units by mouth daily.      . ciclesonide (OMNARIS) 50 MCG/ACT nasal spray Place 2 sprays into both nostrils 2 (two) times daily.      . clonazePAM (KLONOPIN) 1 MG tablet Take 1 mg by mouth at bedtime as needed. For insomnia      . docusate sodium (COLACE) 100 MG capsule Take 200 mg by mouth every evening.       . DULoxetine (CYMBALTA) 20 MG capsule Take 20 mg by mouth daily.      Marland Kitchen esomeprazole (NEXIUM) 40 MG capsule Take 40 mg by mouth 2 (two) times daily before a meal.       . estradiol (ESTRING) 2 MG vaginal ring Place 2 mg vaginally every 3 (three) months. follow package directions      . estradiol (VIVELLE-DOT) 0.025 MG/24HR Place 1 patch onto the skin 2 (two) times a week. Applies new patch on Sunday & Thursday      . fexofenadine (ALLEGRA) 180 MG tablet Take 180 mg by mouth daily.       . fish oil-omega-3 fatty acids 1000 MG capsule Take 1 g by mouth daily.       Marland Kitchen levothyroxine (SYNTHROID, LEVOTHROID) 112 MCG tablet Take 112 mcg by mouth every morning.       . magnesium 30 MG tablet Take 30 mg by mouth daily.       . mometasone-formoterol (DULERA) 100-5 MCG/ACT AERO Inhale 2 puffs into the lungs every morning.       . montelukast (SINGULAIR) 10 MG tablet Take 10 mg by mouth every evening.       . pentosan polysulfate (ELMIRON) 100 MG capsule Take 100 mg by mouth as needed.       . potassium chloride (K-DUR) 10 MEQ tablet Take 1 tablet (10 mEq total) by mouth daily.  90 tablet  3  . Probiotic Product (ALIGN) 4 MG CAPS Take 4 mg by mouth at bedtime.        No current facility-administered medications for this visit.   Facility-Administered Medications Ordered in Other Visits  Medication Dose Route Frequency Provider Last Rate Last Dose  . bupivacaine (MARCAINE) 0.5 % 10 mL, triamcinolone acetonide (KENALOG-40) 40 mg injection   Subcutaneous Once Kathi Ludwig, MD      . bupivacaine (MARCAINE) 0.5 % 15 mL,  phenazopyridine (PYRIDIUM) 400 mg bladder mixture   Bladder Instillation Once Kathi Ludwig, MD  Allergies  Allergen Reactions  . Anesthetics, Amide Other (See Comments)    Patient unsure of names, however multiples cause swelling of airway & nausea  . Sulfonamide Derivatives Anaphylaxis and Swelling  . Azithromycin Other (See Comments)    Severe gastritis   . Bactroban Other (See Comments)    Causes sores in nose  . Ciprofloxacin     REACTION: joint swelling  . Codeine Swelling  . Meperidine Hcl Nausea Only    Hallucinations   . Morphine Nausea Only    Hallucinations   . Neosporin [Neomycin-Polymyxin-Gramicidin] Hives and Dermatitis    All topical "orin's ointment"  . Nitrofurantoin     REACTION: neuropathy in legs  . Penicillins Other (See Comments)    Swelling in joints  . Latex Rash    Review of Systems negative except from HPI and PMH  Physical Exam BP 110/70  Pulse 69  Ht 5\' 6"  (1.676 m)  Wt 139 lb 6.4 oz (63.231 kg)  BMI 22.51 kg/m2 Well developed and nourished in no acute distress HENT normal Neck supple with JVP-flat Clear Regular rate and rhythm, no murmurs or gallops Abd-soft with active BS No Clubbing cyanosis edema Skin-warm and dry A & Oriented  Grossly normal sensory and motor function     Assessment and  Plan

## 2013-04-06 NOTE — Assessment & Plan Note (Signed)
1 the patient has exertional dyspnea particularly upon climbing. Extensive cardiovascular evaluation including cardiac function, cardiac perfusion has been unrevealing. She is an antecedent history to suggest dysautonomia and it was my thought that she might have POTS related dyspnea. It was not ameliorated by beta blockers.  Another possibility could be abnormal mitral valve closure associated with exercise following pacemaker implantation premature septal activation. We'll plan to undertake a stress echo looking for exercise associated MR. We'll also look at her heart rate response to mild exercise as I suspect that she may well have an inappropriate rise in heart rate.

## 2013-04-06 NOTE — Assessment & Plan Note (Signed)
Improved blood pressure today; she is undertaking vigorous fluid repletion. We have not tried her on vasoactive medications  We will stop her beta blocker as there is no apparent benefit

## 2013-04-06 NOTE — Assessment & Plan Note (Signed)
Device dependent. 

## 2013-04-06 NOTE — Patient Instructions (Addendum)
Your physician has recommended you make the following change in your medication:  1) Change taking Atenolol to every other day  Your physician has requested that you have a stress echocardiogram. For further information please visit [email or web address] Please follow instruction sheet as given. Please schedule this on a day Dr. Graciela Husbands is in the office.    Remote monitoring is used to monitor your Pacemaker of ICD from home. This monitoring reduces the number of office visits required to check your device to one time per year. It allows [place] to keep an eye on the functioning of your device to ensure it is working properly. You are scheduled for a device check from home on 07/08/2013. You may send your transmission at any time that day. If you have a wireless device, the transmission will be sent automatically. After your physician reviews your transmission, you will receive a postcard with your next transmission date.  Your physician wants you to follow-up in: August with Dr. Graciela Husbands. You will receive a reminder letter in the mail two months in advance. If you don't receive a letter, please call our office to schedule the follow-up appointment.

## 2013-04-08 DIAGNOSIS — J309 Allergic rhinitis, unspecified: Secondary | ICD-10-CM | POA: Diagnosis not present

## 2013-04-15 ENCOUNTER — Ambulatory Visit (INDEPENDENT_AMBULATORY_CARE_PROVIDER_SITE_OTHER): Payer: Medicare Other | Admitting: Pulmonary Disease

## 2013-04-15 ENCOUNTER — Encounter: Payer: Self-pay | Admitting: Pulmonary Disease

## 2013-04-15 VITALS — BP 110/60 | HR 65 | Temp 97.7°F | Ht 66.5 in | Wt 143.6 lb

## 2013-04-15 DIAGNOSIS — R0602 Shortness of breath: Secondary | ICD-10-CM

## 2013-04-15 NOTE — Progress Notes (Signed)
Subjective:    Patient ID: [name], female    DOB: Dec 31, 1945, 67 y.o.   MRN: [id number]  HPI  67/F, never smoker for FU of dyspnea .  She was evaluated in 2012 for exertional tachycardia noted after left hip replacement surgery- seen by cards/ [place] - Evaluation incl nml holter, echo & stress nuclear study.  She had an extensive WU for MS by neurology She received allergy shots - kozlow x 5 y, nasonex, deviated septum, enlarged turbinates  'Asthma' was diagnosed as a young adult, appears mild 'intermittent ' nature- started as hives, never used epi pen, 'closing up' , relieved by maxair MDI , wheezing on exposure to pine needles  Was on Adderall  Spirometry did not show any airway obstruction.     04/15/2013  Last OV 07/2010  Chief Complaint  Patient presents with  . Follow-up    Last seen 2012. Pt reports 3 months ago she had a huge episode of bradycardia. She has pacemaker placed. Pt reports she get very short winded with just a flight of stairs.    She developed high grade heart block requiring permanent pacemaker complicated by microperforation and  adderall withdrawal  She continues to complain of exercise intolerance and tachycardia assoc with climbing stairs particularly. Orthostatic hypotension has been demonstrated-atenolol was stopped. She takes Nexium for reflux. Stress echo is planned to investigate mitral regurgitation as a cause of her symptoms. Autonomic dysfunction is being considered. Her asthma medications have been increased to include Dulera after an episode of bronchitis last year. Her allergy medications have also been maximized (Kozlow)  She is here to investigate pulmonary cause of her dyspnea and tachycardia. She is obviously frustrated due to frequent doctor's visit and is tearful during the interview. She desaturated to 94% on walking and her heart rate only increased to 98 which he was severely short of breath after walking 2 laps. No  bronchospasm was auscultated  CT angiogram on 01/21/13 did not show pulmonary embolism, showed small pericardial effusion.   Past Medical History  Diagnosis Date  . Diverticulosis   . GERD (gastroesophageal reflux disease)   . Esophageal stricture   . Hemorrhoids   . Mallory - Weiss tear   . Interstitial cystitis   . ADHD (attention deficit hyperactivity disorder)   . Breast cancer     right side  . Arthritis   . Blood transfusion   . Thyroid disease   . Allergy     trees/pollen, mold, fungus, dust mites. Takes allergy shots  . Latex allergy, contact dermatitis   . Cataract   . Depression   . Hernia of abdominal wall     spigelian hernia RLQ  . H/O hiatal hernia   . Neuromuscular disorder     central nervous system neuropathy- seen per Dr Sandria Manly  . Asthma     allergist Dr Illene Labrador- monthly allergy injections  . Complication of anesthesia     reaction to some anesthetics/ 7/12 anesth record on chart- states prefers epidural  . PONV (postoperative nausea and vomiting)     pt needs scop patch  . Recurrent upper respiratory infection (URI) 1/13- to present    bronchitis following surgery- states improved but still with cough. OV with Clearance Dr [name] 09/06/11 on chart  . Symptomatic bradycardia     a. 12/2012 s/p MDT dual chamber PPM, ser # [id number] H; b. 12/2012 post-op course complicated by pericardial effusinon req lead revision.  . Pericardial effusion  a. 12/2012 following ppm placement;  b. 01/01/2013 Echo: EF 55-60%, small pericardial effusion w/o RV collapse-->No need for tap/window.  . Chest pain     a. 12/2012 Cath: nl cors, EF 55-65%.     Review of Systems neg for any significant sore throat, dysphagia, itching, sneezing, nasal congestion or excess/ purulent secretions, fever, chills, sweats, unintended wt loss, pleuritic or exertional cp, hempoptysis, orthopnea pnd or change in chronic leg swelling. Also denies presyncope, palpitations, heartburn, abdominal pain,  nausea, vomiting, diarrhea or change in bowel or urinary habits, dysuria,hematuria, rash, arthralgias, visual complaints, headache, numbness weakness or ataxia.     Objective:   Physical Exam  Gen. Pleasant, well-nourished, in no distress, anxious affect ENT - no lesions, no post nasal drip Neck: No JVD, no thyromegaly, no carotid bruits Lungs: no use of accessory muscles, no dullness to percussion, clear without rales or rhonchi  Cardiovascular: Rhythm regular, heart sounds  normal, no murmurs or gallops, no peripheral edema Abdomen: soft and non-tender, no hepatosplenomegaly, BS normal. Musculoskeletal: No deformities, no cyanosis or clubbing Neuro:  alert, non focal       Assessment & Plan:

## 2013-04-15 NOTE — Patient Instructions (Signed)
I will review breathing tests done by Dr Lucie Leather Since asthma is well controlled, suggest stopping dulera

## 2013-04-15 NOTE — Assessment & Plan Note (Addendum)
The cause of her symptoms is indeed puzzling and no doubt frustrating to the patient. I do not feel that asthma is an issue here, as such she is being overtreated with steroid / LABA combination . In fact, laba would only make tachycardia worse. As such I feel comfortable stopping dulera.  Medication interactions or autonomic dysfunction are valid possibilities as cause of her orthostatic hypotension and persistent tachycardia. I note that aderall has been stopped. I am not sure of the interactions between Cymbalta and antihistaminics. I would defer this to her primary care physician. Stress echo is being planned. I understand that a 24-hour urine was collected but I do not see the results. I'm not sure if checking 24-hour urine for metanephrines for pheochromocytoma would be of any benefit. I do feel that she is somewhat being over treated for allergies but would defer that to Dr. Steffanie Rainwater who seems to know her better. Lastly, I do note that she has undergone workup for multiple sclerosis -if this doesn't have been autonomic dysfunction, then perhaps would consider another neurologic evaluation.

## 2013-04-21 DIAGNOSIS — J309 Allergic rhinitis, unspecified: Secondary | ICD-10-CM | POA: Diagnosis not present

## 2013-04-29 ENCOUNTER — Ambulatory Visit (HOSPITAL_COMMUNITY): Payer: Medicare Other | Attending: Internal Medicine | Admitting: Radiology

## 2013-04-29 ENCOUNTER — Ambulatory Visit (HOSPITAL_BASED_OUTPATIENT_CLINIC_OR_DEPARTMENT_OTHER): Payer: Medicare Other

## 2013-04-29 ENCOUNTER — Encounter: Payer: Self-pay | Admitting: Cardiology

## 2013-04-29 ENCOUNTER — Encounter: Payer: Self-pay | Admitting: *Deleted

## 2013-04-29 ENCOUNTER — Encounter: Payer: Self-pay | Admitting: Internal Medicine

## 2013-04-29 DIAGNOSIS — I951 Orthostatic hypotension: Secondary | ICD-10-CM

## 2013-04-29 DIAGNOSIS — I059 Rheumatic mitral valve disease, unspecified: Secondary | ICD-10-CM | POA: Diagnosis not present

## 2013-04-29 DIAGNOSIS — R0602 Shortness of breath: Secondary | ICD-10-CM | POA: Insufficient documentation

## 2013-04-29 DIAGNOSIS — R0609 Other forms of dyspnea: Secondary | ICD-10-CM | POA: Insufficient documentation

## 2013-04-29 DIAGNOSIS — R079 Chest pain, unspecified: Secondary | ICD-10-CM | POA: Insufficient documentation

## 2013-04-29 DIAGNOSIS — R0989 Other specified symptoms and signs involving the circulatory and respiratory systems: Secondary | ICD-10-CM

## 2013-04-29 DIAGNOSIS — I4891 Unspecified atrial fibrillation: Secondary | ICD-10-CM

## 2013-04-29 LAB — MDC_IDC_ENUM_SESS_TYPE_INCLINIC
Battery Remaining Longevity: 106 mo
Battery Voltage: 3.02 V
Brady Statistic AP VS Percent: 0.04 %
Brady Statistic AS VS Percent: 0.17 %
Brady Statistic RV Percent Paced: 99.79 %
Lead Channel Impedance Value: 323 Ohm
Lead Channel Impedance Value: 456 Ohm
Lead Channel Pacing Threshold Pulse Width: 0.4 ms
Lead Channel Pacing Threshold Pulse Width: 0.4 ms
Lead Channel Sensing Intrinsic Amplitude: 13.25 mV
Lead Channel Sensing Intrinsic Amplitude: 13.25 mV
Lead Channel Sensing Intrinsic Amplitude: 2.5 mV
Lead Channel Setting Pacing Amplitude: 2 V
Lead Channel Setting Pacing Amplitude: 2.5 V
Lead Channel Setting Pacing Pulse Width: 0.4 ms
Lead Channel Setting Sensing Sensitivity: 0.9 mV

## 2013-04-29 NOTE — Progress Notes (Signed)
Stress Echocardiogram performed for Mitral Regurgitation assessment.

## 2013-04-29 NOTE — Progress Notes (Unsigned)
Patient was subjected to exercise testing. He had very limited exercise performance which correlated with 2-1 conduction at the upper rate behavior at a rate of 120 beats per minute occurring as a consequence of a long AV delay in the context of her A.-D. pacing algorithm this was reprogrammed. She did better but still not for a well.

## 2013-04-30 ENCOUNTER — Other Ambulatory Visit: Payer: Self-pay | Admitting: Internal Medicine

## 2013-05-01 ENCOUNTER — Encounter: Payer: Self-pay | Admitting: *Deleted

## 2013-05-01 DIAGNOSIS — J309 Allergic rhinitis, unspecified: Secondary | ICD-10-CM | POA: Diagnosis not present

## 2013-05-12 ENCOUNTER — Encounter: Payer: Self-pay | Admitting: Internal Medicine

## 2013-05-19 DIAGNOSIS — J309 Allergic rhinitis, unspecified: Secondary | ICD-10-CM | POA: Diagnosis not present

## 2013-05-25 ENCOUNTER — Ambulatory Visit (INDEPENDENT_AMBULATORY_CARE_PROVIDER_SITE_OTHER): Payer: Medicare Other | Admitting: Internal Medicine

## 2013-05-25 ENCOUNTER — Encounter: Payer: Self-pay | Admitting: Internal Medicine

## 2013-05-25 VITALS — BP 114/73 | HR 63 | Ht 66.5 in | Wt 145.4 lb

## 2013-05-25 DIAGNOSIS — I951 Orthostatic hypotension: Secondary | ICD-10-CM | POA: Diagnosis not present

## 2013-05-25 DIAGNOSIS — R0602 Shortness of breath: Secondary | ICD-10-CM | POA: Diagnosis not present

## 2013-05-25 DIAGNOSIS — R55 Syncope and collapse: Secondary | ICD-10-CM

## 2013-05-25 DIAGNOSIS — I4891 Unspecified atrial fibrillation: Secondary | ICD-10-CM

## 2013-05-25 DIAGNOSIS — Z950 Presence of cardiac pacemaker: Secondary | ICD-10-CM

## 2013-05-25 DIAGNOSIS — K5900 Constipation, unspecified: Secondary | ICD-10-CM | POA: Insufficient documentation

## 2013-05-25 NOTE — Progress Notes (Signed)
Patient Care Team: Geoffery Lyons, MD as PCP - General (Internal Medicine)   HPI  [name] is a 68 y.o. female Seen in followup for high grade heart block for which she underwent pacing compllicated by microperforation modest effusion and then adderall withdrawal   She is far  better following reprogramming of the device after we noted upper rate behavior limiting heart rate excursion she still has complaints sometimes and exercise intolerance on stairs. She is also struggling significantly with constipation which intravenous to her dysautonomia. She reminded that she has a diagnosis of central polyneuropathy.  She has ongoing problems with significant pain. She also has problems with prolonged standing. This is complicated by nausea and lightheadedness.    Past Medical History  Diagnosis Date  . Diverticulosis   . GERD (gastroesophageal reflux disease)   . Esophageal stricture   . Hemorrhoids   . Mallory - Weiss tear   . Interstitial cystitis   . ADHD (attention deficit hyperactivity disorder)   . Breast cancer     right side  . Arthritis   . Blood transfusion   . Thyroid disease   . Allergy     trees/pollen, mold, fungus, dust mites. Takes allergy shots  . Latex allergy, contact dermatitis   . Cataract   . Depression   . Hernia of abdominal wall     spigelian hernia RLQ  . H/O hiatal hernia   . Neuromuscular disorder     central nervous system neuropathy- seen per Dr Erling Cruz  . Asthma     allergist Dr Olena Heckle- monthly allergy injections  . Complication of anesthesia     reaction to some anesthetics/ 7/12 anesth record on chart- states prefers epidural  . PONV (postoperative nausea and vomiting)     pt needs scop patch  . Recurrent upper respiratory infection (URI) 1/13- to present    bronchitis following surgery- states improved but still with cough. OV with Clearance Dr [name] 09/06/11 on chart  . Symptomatic bradycardia     a. 12/2012 s/p MDT dual  chamber PPM, ser # [id number] H; b. 12/2012 post-op course complicated by pericardial effusinon req lead revision.  . Pericardial effusion     a. 12/2012 following ppm placement;  b. 01/01/2013 Echo: EF 55-60%, small pericardial effusion w/o RV collapse-->No need for tap/window.  . Chest pain     a. 12/2012 Cath: nl cors, EF 55-65%.    Past Surgical History  Procedure Laterality Date  . Mastectomy modified radical      right; with immediate reconstruction  . Laparoscopy  1973  . Abdominal hysterectomy  1974  . Cystoscopy  1975, 2007  . Myringoplasty  1962  . Tympanoplasty  1973    right  . Oophorectomy  1982  . Bladder suspension    . Rectocele repair      with cystocele repair  . Total hip arthroplasty    . Ventral hernia repair  05/30/2011    Procedure: HERNIA REPAIR VENTRAL ADULT;  Surgeon: Haywood Lasso, MD;  Location: [place];  Service: General;  Laterality: Right;  repair right spigelian hernia  . Breast biopsy  2002    NO BLOOD PRESSURES ON RIGHT SIDE/   s/p  axillary node dissection  . Tonsillectomy    . Back surgery      cervical fusion 4-5 with plate  . Brain surgery      bilateral lasik  . Cysto with hydrodistension  09/07/2011  Procedure: CYSTOSCOPY/HYDRODISTENSION;  Surgeon: Ailene Rud, MD;  Location: [facility];  Service: Urology;  Laterality: N/A;  INSTILLATION OF MARCAINE/PYRIDIUM INSTILLATION OF MARCAINE/KENALOG  . Pacemaker insertion      Current Outpatient Prescriptions  Medication Sig Dispense Refill  . albuterol (PROVENTIL HFA;VENTOLIN HFA) 108 (90 BASE) MCG/ACT inhaler Inhale 2 puffs into the lungs every 6 (six) hours as needed. For shortness of breath      . Ascorbic Acid (VITAMIN C) 100 MG tablet Take 100 mg by mouth every morning.       Marland Kitchen b complex vitamins capsule Take 1 capsule by mouth daily.       Marland Kitchen buPROPion (WELLBUTRIN SR) 150 MG 12 hr tablet Take 150 mg by mouth daily.       . cholecalciferol (VITAMIN D) 1000 UNITS  tablet Take 1,000 Units by mouth daily.      . ciclesonide (OMNARIS) 50 MCG/ACT nasal spray Place 2 sprays into both nostrils 2 (two) times daily.      . clonazePAM (KLONOPIN) 1 MG tablet Take 1 mg by mouth at bedtime as needed. For insomnia      . cyanocobalamin 2000 MCG tablet Take 2,000 mcg by mouth daily.      Marland Kitchen docusate sodium (COLACE) 100 MG capsule Take 200 mg by mouth every evening.       . DULoxetine (CYMBALTA) 20 MG capsule Take 20 mg by mouth daily.      Marland Kitchen esomeprazole (NEXIUM) 40 MG capsule Take 40 mg by mouth 2 (two) times daily before a meal.       . estradiol (ESTRING) 2 MG vaginal ring Place 2 mg vaginally every 3 (three) months. follow package directions      . estradiol (VIVELLE-DOT) 0.025 MG/24HR Place 1 patch onto the skin 2 (two) times a week. Applies new patch on Sunday & Thursday      . fexofenadine (ALLEGRA) 180 MG tablet Take 180 mg by mouth daily.       . fish oil-omega-3 fatty acids 1000 MG capsule Take 1 g by mouth daily.       Marland Kitchen levothyroxine (SYNTHROID, LEVOTHROID) 112 MCG tablet Take 112 mcg by mouth every morning.       . magnesium 30 MG tablet Take 30 mg by mouth daily.       . mometasone-formoterol (DULERA) 100-5 MCG/ACT AERO Inhale 2 puffs into the lungs as needed.       . montelukast (SINGULAIR) 10 MG tablet Take 10 mg by mouth every evening.       Marland Kitchen OVER THE COUNTER MEDICATION Take 1 tablet by mouth 2 (two) times daily. Alphalopioc Acid 200 mg twice daily.      . pentosan polysulfate (ELMIRON) 100 MG capsule Take 100 mg by mouth as needed.       . potassium chloride (K-DUR) 10 MEQ tablet Take 1 tablet (10 mEq total) by mouth daily.  90 tablet  3  . Probiotic Product (ALIGN) 4 MG CAPS Take 4 mg by mouth at bedtime.        No current facility-administered medications for this visit.   Facility-Administered Medications Ordered in Other Visits  Medication Dose Route Frequency Provider Last Rate Last Dose  . bupivacaine (MARCAINE) 0.5 % 10 mL, triamcinolone  acetonide (KENALOG-40) 40 mg injection   Subcutaneous Once Ailene Rud, MD      . bupivacaine (MARCAINE) 0.5 % 15 mL, phenazopyridine (PYRIDIUM) 400 mg bladder mixture   Bladder Instillation Once Ailene Rud,  MD        Allergies  Allergen Reactions  . Anesthetics, Amide Other (See Comments)    Patient unsure of names, however multiples cause swelling of airway & nausea  . Sulfonamide Derivatives Anaphylaxis and Swelling  . Azithromycin Other (See Comments)    Severe gastritis   . Bactroban Other (See Comments)    Causes sores in nose  . Ciprofloxacin     REACTION: joint swelling  . Codeine Swelling  . Meperidine Hcl Nausea Only    Hallucinations   . Morphine Nausea Only    Hallucinations   . Neosporin [Neomycin-Polymyxin-Gramicidin] Hives and Dermatitis    All topical "orin's ointment"  . Nitrofurantoin     REACTION: neuropathy in legs  . Penicillins Other (See Comments)    Swelling in joints  . Latex Rash    Review of Systems negative except from HPI and PMH  Physical Exam BP 114/73  Pulse 63  Ht 5' 6.5" (1.689 m)  Wt 145 lb 6.4 oz (65.953 kg)  BMI 23.12 kg/m2 Well developed and well nourished in no acute distress HENT normal E scleral and icterus clear Neck Supple JVP flat; carotids brisk and full Clear to ausculation  Regular rate and rhythm, no murmurs gallops or rub Soft with active bowel sounds No clubbing cyanosis no Edema Alert and oriented, grossly normal motor and sensory function Skin Warm and Dry    Assessment and  Plan

## 2013-05-25 NOTE — Assessment & Plan Note (Signed)
i have given her two names of GI MDs who have some experience with autonomic dysfunction. One is a Dr. Rexene Edison. in [place] in the other is Dr. Bayard Hugger at [facility]

## 2013-05-25 NOTE — Assessment & Plan Note (Signed)
improved

## 2013-05-25 NOTE — Patient Instructions (Signed)
Your physician recommends that you continue on your current medications as directed. Please refer to the Current Medication list given to you today.  Follow up in August with Dr. Caryl Comes.

## 2013-05-25 NOTE — Assessment & Plan Note (Signed)
Much improved following reprogramming  Her symptoms of exercise intolerance may be related to dysautonomia, and/or ongoing issues with upper rate behavior. She was like to defer further evaluation to following her hip surgery

## 2013-05-28 DIAGNOSIS — M169 Osteoarthritis of hip, unspecified: Secondary | ICD-10-CM | POA: Diagnosis not present

## 2013-05-28 DIAGNOSIS — M1610 Unilateral primary osteoarthritis, unspecified hip: Secondary | ICD-10-CM | POA: Diagnosis not present

## 2013-05-29 ENCOUNTER — Other Ambulatory Visit (HOSPITAL_COMMUNITY): Payer: Self-pay | Admitting: Orthopaedic Surgery

## 2013-05-29 NOTE — Progress Notes (Signed)
Need orders in EPIC.  Surgery scheduled for 06/12/13.  Thank You.

## 2013-06-04 ENCOUNTER — Encounter (HOSPITAL_COMMUNITY): Payer: Self-pay | Admitting: Pharmacy Technician

## 2013-06-08 ENCOUNTER — Encounter (HOSPITAL_COMMUNITY)
Admission: RE | Admit: 2013-06-08 | Discharge: 2013-06-08 | Disposition: A | Discharge status: Home / Self Care | Payer: Medicare Other | Source: Ambulatory Visit | Attending: Orthopaedic Surgery | Admitting: Orthopaedic Surgery

## 2013-06-08 ENCOUNTER — Encounter (HOSPITAL_COMMUNITY): Payer: Self-pay

## 2013-06-08 ENCOUNTER — Ambulatory Visit (HOSPITAL_COMMUNITY)
Admission: RE | Admit: 2013-06-08 | Discharge: 2013-06-08 | Disposition: A | Discharge status: Home / Self Care | Payer: Medicare Other | Source: Ambulatory Visit | Attending: Orthopaedic Surgery | Admitting: Orthopaedic Surgery

## 2013-06-08 DIAGNOSIS — Z01812 Encounter for preprocedural laboratory examination: Secondary | ICD-10-CM | POA: Diagnosis not present

## 2013-06-08 DIAGNOSIS — Z0183 Encounter for blood typing: Secondary | ICD-10-CM | POA: Diagnosis not present

## 2013-06-08 DIAGNOSIS — Z01810 Encounter for preprocedural cardiovascular examination: Secondary | ICD-10-CM | POA: Insufficient documentation

## 2013-06-08 DIAGNOSIS — J45909 Unspecified asthma, uncomplicated: Secondary | ICD-10-CM | POA: Diagnosis not present

## 2013-06-08 DIAGNOSIS — M1610 Unilateral primary osteoarthritis, unspecified hip: Secondary | ICD-10-CM | POA: Diagnosis not present

## 2013-06-08 DIAGNOSIS — J984 Other disorders of lung: Secondary | ICD-10-CM | POA: Insufficient documentation

## 2013-06-08 DIAGNOSIS — M169 Osteoarthritis of hip, unspecified: Secondary | ICD-10-CM | POA: Insufficient documentation

## 2013-06-08 DIAGNOSIS — Z01818 Encounter for other preprocedural examination: Secondary | ICD-10-CM | POA: Diagnosis not present

## 2013-06-08 HISTORY — DX: Cardiac arrhythmia, unspecified: I49.9

## 2013-06-08 HISTORY — DX: Hypothyroidism, unspecified: E03.9

## 2013-06-08 HISTORY — DX: Disorder of the autonomic nervous system, unspecified: G90.9

## 2013-06-08 HISTORY — DX: Presence of cardiac pacemaker: Z95.0

## 2013-06-08 HISTORY — DX: Shortness of breath: R06.02

## 2013-06-08 HISTORY — DX: Other skin changes: R23.8

## 2013-06-08 HISTORY — DX: Spontaneous ecchymoses: R23.3

## 2013-06-08 LAB — BASIC METABOLIC PANEL
BUN: 17 mg/dL (ref 6–23)
CALCIUM: 9.1 mg/dL (ref 8.4–10.5)
CO2: 27 mEq/L (ref 19–32)
Chloride: 104 mEq/L (ref 96–112)
Creatinine, Ser: 0.7 mg/dL (ref 0.50–1.10)
GFR, EST NON AFRICAN AMERICAN: 87 mL/min — AB (ref >90)
Glucose, Bld: 86 mg/dL (ref 70–99)
Potassium: 4.4 mEq/L (ref 3.7–5.3)
SODIUM: 141 meq/L (ref 137–147)

## 2013-06-08 LAB — CBC
HCT: 40.7 % (ref 36.0–46.0)
Hemoglobin: 13.8 g/dL (ref 12.0–15.0)
MCH: 31.9 pg (ref 26.0–34.0)
MCHC: 33.9 g/dL (ref 30.0–36.0)
MCV: 94.2 fL (ref 78.0–100.0)
PLATELETS: 203 10*3/uL (ref 150–400)
RBC: 4.32 MIL/uL (ref 3.87–5.11)
RDW: 13.2 % (ref 11.5–15.5)
WBC: 5.6 10*3/uL (ref 4.0–10.5)

## 2013-06-08 LAB — PROTIME-INR
INR: 0.96 (ref 0.00–1.49)
Prothrombin Time: 12.6 seconds (ref 11.6–15.2)

## 2013-06-08 LAB — URINALYSIS, ROUTINE W REFLEX MICROSCOPIC
Bilirubin Urine: NEGATIVE
Glucose, UA: NEGATIVE mg/dL
Hgb urine dipstick: NEGATIVE
KETONES UR: NEGATIVE mg/dL
LEUKOCYTES UA: NEGATIVE
Nitrite: NEGATIVE
PH: 7 (ref 5.0–8.0)
Protein, ur: NEGATIVE mg/dL
SPECIFIC GRAVITY, URINE: 1.022 (ref 1.005–1.030)
Urobilinogen, UA: 0.2 mg/dL (ref 0.0–1.0)

## 2013-06-08 LAB — APTT: APTT: 26 s (ref 24–37)

## 2013-06-08 LAB — SURGICAL PCR SCREEN
MRSA, PCR: NEGATIVE
Staphylococcus aureus: NEGATIVE

## 2013-06-08 NOTE — Pre-Procedure Instructions (Addendum)
EKG AND CXR WERE DONE TODAY - PREOP AT [facility] ON CHART STATING NO DEVICE REPROGRAMMING OR MAGNET PLACEMENT NEEDED. LAST CARDIOLOGY OFFICE VISIT WITH DR. Caryl Comes IN [facility] 05/25/13.

## 2013-06-08 NOTE — Patient Instructions (Addendum)
YOUR SURGERY IS SCHEDULED AT [facility]  ON:  Friday  1/30     REPORT TO  SHORT STAY CENTER AT:  5:30 AM      PHONE # FOR SHORT STAY IS [phone]  DO NOT EAT OR DRINK ANYTHING AFTER MIDNIGHT THE NIGHT BEFORE YOUR SURGERY.  YOU MAY BRUSH YOUR TEETH, RINSE OUT YOUR MOUTH--BUT NO WATER, NO FOOD, NO CHEWING GUM, NO MINTS, NO CANDIES, NO CHEWING TOBACCO.  PLEASE TAKE THE FOLLOWING MEDICATIONS THE AM OF YOUR SURGERY WITH A FEW SIPS OF WATER:  ATENOLOL, CYMBALTA, NEXIUM, ALLEGRA, LEVOTHYROXINE.  USE YOUR OMNARIS NASAL SPRAY.  USE YOUR ALBUTEROL INHALER.   [place].     DO NOT BRING VALUABLES, MONEY, CREDIT CARDS.  DO NOT WEAR JEWELRY, MAKE-UP, NAIL POLISH AND NO METAL PINS OR CLIPS IN YOUR HAIR. CONTACT LENS, DENTURES / PARTIALS, GLASSES SHOULD NOT BE WORN TO SURGERY AND IN MOST CASES-HEARING AIDS WILL NEED TO BE REMOVED.  BRING YOUR GLASSES CASE, ANY EQUIPMENT NEEDED FOR YOUR CONTACT LENS. FOR PATIENTS ADMITTED TO THE HOSPITAL--CHECK OUT TIME THE DAY OF DISCHARGE IS 11:00 AM.  ALL INPATIENT ROOMS ARE PRIVATE - WITH BATHROOM, TELEPHONE, TELEVISION AND WIFI INTERNET.                                                    PLEASE READ OVER ANY  FACT SHEETS THAT YOU WERE GIVEN: MRSA INFORMATION, BLOOD TRANSFUSION INFORMATION  FAILURE TO FOLLOW THESE INSTRUCTIONS MAY RESULT IN THE CANCELLATION OF YOUR SURGERY. PLEASE BE AWARE THAT YOU MAY NEED ADDITIONAL BLOOD DRAWN DAY OF YOUR SURGERY  PATIENT SIGNATURE_________________________________

## 2013-06-09 DIAGNOSIS — J309 Allergic rhinitis, unspecified: Secondary | ICD-10-CM | POA: Diagnosis not present

## 2013-06-10 DIAGNOSIS — J309 Allergic rhinitis, unspecified: Secondary | ICD-10-CM | POA: Diagnosis not present

## 2013-06-11 NOTE — Anesthesia Preprocedure Evaluation (Signed)
Anesthesia Evaluation  Patient identified by MRN, date of birth, ID band Patient awake    Reviewed: Allergy & Precautions, H&P , NPO status , Patient's Chart, lab work & pertinent test results, reviewed documented beta blocker date and time   History of Anesthesia Complications (+) PONV and history of anesthetic complications  Airway Mallampati: II TM Distance: >3 FB Neck ROM: full    Dental no notable dental hx. (+) Caps and Dental Advisory Given All teeth capped:   Pulmonary shortness of breath and with exertion, asthma , pneumonia -, Recent URI ,  Recurrent bronchitis 1/13 to present.  Has clearance Dr. [name] 09/06/11 breath sounds clear to auscultation  Pulmonary exam normal       Cardiovascular Exercise Tolerance: Good hypertension, Pt. on home beta blockers negative cardio ROS  + dysrhythmias + pacemaker Rhythm:regular Rate:Normal     Neuro/Psych Depression An MS type disorder.  Neuromuscular disease negative neurological ROS  negative psych ROS   GI/Hepatic negative GI ROS, Neg liver ROS, hiatal hernia, GERD-  Medicated and Controlled,Mallory weiss tear.  esoph stricture   Endo/Other  negative endocrine ROSHypothyroidism   Renal/GU negative Renal ROS  negative genitourinary   Musculoskeletal   Abdominal   Peds  Hematology negative hematology ROS (+)   Anesthesia Other Findings   Reproductive/Obstetrics negative OB ROS                           Anesthesia Physical  Anesthesia Plan  ASA: III  Anesthesia Plan: Spinal   Post-op Pain Management:    Induction: Intravenous  Airway Management Planned:   Additional Equipment:   Intra-op Plan:   Post-operative Plan:   Informed Consent: I have reviewed the patients History and Physical, chart, labs and discussed the procedure including the risks, benefits and alternatives for the proposed anesthesia with the patient or  authorized representative who has indicated his/her understanding and acceptance.   Dental advisory given  Plan Discussed with: CRNA  Anesthesia Plan Comments:         Anesthesia Quick Evaluation

## 2013-06-12 ENCOUNTER — Inpatient Hospital Stay (HOSPITAL_COMMUNITY): Payer: Medicare Other | Admitting: Anesthesiology

## 2013-06-12 ENCOUNTER — Inpatient Hospital Stay (HOSPITAL_COMMUNITY): Payer: Medicare Other

## 2013-06-12 ENCOUNTER — Encounter (HOSPITAL_COMMUNITY)
Admission: RE | Disposition: A | Discharge status: Home Health | Payer: Self-pay | Source: Ambulatory Visit | Attending: Orthopaedic Surgery

## 2013-06-12 ENCOUNTER — Encounter (HOSPITAL_COMMUNITY): Payer: Self-pay | Admitting: *Deleted

## 2013-06-12 ENCOUNTER — Inpatient Hospital Stay (HOSPITAL_COMMUNITY)
Admission: RE | Admit: 2013-06-12 | Discharge: 2013-06-15 | DRG: 470 | Disposition: A | Discharge status: Home Health | Payer: Medicare Other | Source: Ambulatory Visit | Attending: Orthopaedic Surgery | Admitting: Orthopaedic Surgery

## 2013-06-12 ENCOUNTER — Encounter (HOSPITAL_COMMUNITY): Payer: Medicare Other | Admitting: Anesthesiology

## 2013-06-12 DIAGNOSIS — G4700 Insomnia, unspecified: Secondary | ICD-10-CM | POA: Diagnosis present

## 2013-06-12 DIAGNOSIS — Z880 Allergy status to penicillin: Secondary | ICD-10-CM

## 2013-06-12 DIAGNOSIS — F909 Attention-deficit hyperactivity disorder, unspecified type: Secondary | ICD-10-CM | POA: Diagnosis present

## 2013-06-12 DIAGNOSIS — Z888 Allergy status to other drugs, medicaments and biological substances status: Secondary | ICD-10-CM | POA: Diagnosis not present

## 2013-06-12 DIAGNOSIS — M25559 Pain in unspecified hip: Secondary | ICD-10-CM | POA: Diagnosis not present

## 2013-06-12 DIAGNOSIS — E039 Hypothyroidism, unspecified: Secondary | ICD-10-CM | POA: Diagnosis present

## 2013-06-12 DIAGNOSIS — Z79899 Other long term (current) drug therapy: Secondary | ICD-10-CM | POA: Diagnosis not present

## 2013-06-12 DIAGNOSIS — Z96649 Presence of unspecified artificial hip joint: Secondary | ICD-10-CM

## 2013-06-12 DIAGNOSIS — M169 Osteoarthritis of hip, unspecified: Secondary | ICD-10-CM | POA: Diagnosis present

## 2013-06-12 DIAGNOSIS — J45909 Unspecified asthma, uncomplicated: Secondary | ICD-10-CM | POA: Diagnosis present

## 2013-06-12 DIAGNOSIS — Z853 Personal history of malignant neoplasm of breast: Secondary | ICD-10-CM

## 2013-06-12 DIAGNOSIS — M2540 Effusion, unspecified joint: Secondary | ICD-10-CM | POA: Diagnosis not present

## 2013-06-12 DIAGNOSIS — Z885 Allergy status to narcotic agent status: Secondary | ICD-10-CM

## 2013-06-12 DIAGNOSIS — N3010 Interstitial cystitis (chronic) without hematuria: Secondary | ICD-10-CM | POA: Diagnosis present

## 2013-06-12 DIAGNOSIS — Z91040 Latex allergy status: Secondary | ICD-10-CM

## 2013-06-12 DIAGNOSIS — Z950 Presence of cardiac pacemaker: Secondary | ICD-10-CM

## 2013-06-12 DIAGNOSIS — M1612 Unilateral primary osteoarthritis, left hip: Secondary | ICD-10-CM

## 2013-06-12 DIAGNOSIS — D62 Acute posthemorrhagic anemia: Secondary | ICD-10-CM | POA: Diagnosis not present

## 2013-06-12 DIAGNOSIS — K219 Gastro-esophageal reflux disease without esophagitis: Secondary | ICD-10-CM | POA: Diagnosis present

## 2013-06-12 DIAGNOSIS — M1610 Unilateral primary osteoarthritis, unspecified hip: Principal | ICD-10-CM | POA: Diagnosis present

## 2013-06-12 DIAGNOSIS — Z471 Aftercare following joint replacement surgery: Secondary | ICD-10-CM | POA: Diagnosis not present

## 2013-06-12 DIAGNOSIS — K5730 Diverticulosis of large intestine without perforation or abscess without bleeding: Secondary | ICD-10-CM | POA: Diagnosis not present

## 2013-06-12 HISTORY — PX: TOTAL HIP ARTHROPLASTY: SHX124

## 2013-06-12 LAB — TYPE AND SCREEN
ABO/RH(D): O POS
ANTIBODY SCREEN: NEGATIVE

## 2013-06-12 SURGERY — ARTHROPLASTY, HIP, TOTAL, ANTERIOR APPROACH
Anesthesia: Spinal | Site: Hip | Laterality: Left

## 2013-06-12 MED ORDER — SODIUM CHLORIDE 0.9 % IJ SOLN
INTRAMUSCULAR | Status: DC | PRN
  Administered 2013-06-12: 09:00:00

## 2013-06-12 MED ORDER — ONDANSETRON HCL 4 MG PO TABS: 4.0000 mg | ORAL_TABLET | Freq: Four times a day (QID) | ORAL | Status: DC | PRN

## 2013-06-12 MED ORDER — MENTHOL 3 MG MT LOZG: 1.0000 | LOZENGE | OROMUCOSAL | Status: DC | PRN

## 2013-06-12 MED ORDER — TRAMADOL HCL 50 MG PO TABS
100.0000 mg | ORAL_TABLET | Freq: Four times a day (QID) | ORAL | Status: DC | PRN
  Administered 2013-06-13 – 2013-06-15 (×6): 100 mg via ORAL
  Administered 2013-06-15: 50 mg via ORAL
  Filled 2013-06-12 (×8): qty 2

## 2013-06-12 MED ORDER — ONDANSETRON HCL 4 MG/2ML IJ SOLN: 4.0000 mg | Freq: Four times a day (QID) | INTRAMUSCULAR | Status: DC | PRN

## 2013-06-12 MED ORDER — PHENOL 1.4 % MT LIQD: 1.0000 | OROMUCOSAL | Status: DC | PRN

## 2013-06-12 MED ORDER — DULOXETINE HCL 60 MG PO CPEP
60.0000 mg | ORAL_CAPSULE | Freq: Every morning | ORAL | Status: DC
  Administered 2013-06-13 – 2013-06-15 (×3): 60 mg via ORAL
  Filled 2013-06-12 (×3): qty 1

## 2013-06-12 MED ORDER — FENTANYL CITRATE 0.05 MG/ML IJ SOLN
INTRAMUSCULAR | Status: AC
  Filled 2013-06-12: qty 2

## 2013-06-12 MED ORDER — ATENOLOL 25 MG PO TABS
25.0000 mg | ORAL_TABLET | Freq: Every morning | ORAL | Status: DC
  Administered 2013-06-13 – 2013-06-15 (×2): 25 mg via ORAL
  Filled 2013-06-12 (×3): qty 1

## 2013-06-12 MED ORDER — MAGNESIUM OXIDE 400 (241.3 MG) MG PO TABS
400.0000 mg | ORAL_TABLET | Freq: Every day | ORAL | Status: DC
  Administered 2013-06-12 – 2013-06-15 (×4): 400 mg via ORAL
  Filled 2013-06-12 (×4): qty 1

## 2013-06-12 MED ORDER — ACETAMINOPHEN 10 MG/ML IV SOLN
1000.0000 mg | Freq: Once | INTRAVENOUS | Status: AC
  Administered 2013-06-12: 1000 mg via INTRAVENOUS
  Filled 2013-06-12 (×2): qty 100

## 2013-06-12 MED ORDER — PROPOFOL 10 MG/ML IV BOLUS
INTRAVENOUS | Status: AC
  Filled 2013-06-12: qty 20

## 2013-06-12 MED ORDER — METOCLOPRAMIDE HCL 5 MG/ML IJ SOLN: 5.0000 mg | Freq: Three times a day (TID) | INTRAMUSCULAR | Status: DC | PRN

## 2013-06-12 MED ORDER — ASPIRIN EC 325 MG PO TBEC
325.0000 mg | DELAYED_RELEASE_TABLET | Freq: Two times a day (BID) | ORAL | Status: DC
  Administered 2013-06-12 – 2013-06-15 (×6): 325 mg via ORAL
  Filled 2013-06-12 (×9): qty 1

## 2013-06-12 MED ORDER — KETOROLAC TROMETHAMINE 15 MG/ML IJ SOLN
7.5000 mg | Freq: Four times a day (QID) | INTRAMUSCULAR | Status: AC
  Administered 2013-06-12 – 2013-06-13 (×4): 7.5 mg via INTRAVENOUS
  Filled 2013-06-12 (×4): qty 1

## 2013-06-12 MED ORDER — FENTANYL CITRATE 0.05 MG/ML IJ SOLN
INTRAMUSCULAR | Status: DC | PRN
  Administered 2013-06-12: 100 ug via INTRAVENOUS

## 2013-06-12 MED ORDER — DEXAMETHASONE SODIUM PHOSPHATE 10 MG/ML IJ SOLN
10.0000 mg | Freq: Three times a day (TID) | INTRAMUSCULAR | Status: AC
  Filled 2013-06-12 (×3): qty 1

## 2013-06-12 MED ORDER — ACETAMINOPHEN 325 MG PO TABS: 650.0000 mg | ORAL_TABLET | Freq: Four times a day (QID) | ORAL | Status: DC | PRN

## 2013-06-12 MED ORDER — METOCLOPRAMIDE HCL 10 MG PO TABS: 5.0000 mg | ORAL_TABLET | Freq: Three times a day (TID) | ORAL | Status: DC | PRN

## 2013-06-12 MED ORDER — CICLESONIDE 50 MCG/ACT NA SUSP: 2.0000 | Freq: Two times a day (BID) | NASAL | Status: DC

## 2013-06-12 MED ORDER — METHOCARBAMOL 500 MG PO TABS
500.0000 mg | ORAL_TABLET | Freq: Four times a day (QID) | ORAL | Status: DC | PRN
  Administered 2013-06-12 – 2013-06-15 (×6): 500 mg via ORAL
  Filled 2013-06-12 (×6): qty 1

## 2013-06-12 MED ORDER — CHLORHEXIDINE GLUCONATE 4 % EX LIQD: 60.0000 mL | Freq: Once | CUTANEOUS | Status: DC

## 2013-06-12 MED ORDER — BUPROPION HCL ER (XL) 150 MG PO TB24: 150.0000 mg | ORAL_TABLET | Freq: Every morning | ORAL | Status: DC

## 2013-06-12 MED ORDER — VITAMIN D3 25 MCG (1000 UNIT) PO TABS
1000.0000 [IU] | ORAL_TABLET | Freq: Every day | ORAL | Status: DC
  Administered 2013-06-12 – 2013-06-15 (×4): 1000 [IU] via ORAL
  Filled 2013-06-12 (×4): qty 1

## 2013-06-12 MED ORDER — LACTINEX PO CHEW
1.0000 | CHEWABLE_TABLET | Freq: Every evening | ORAL | Status: DC
  Administered 2013-06-12 – 2013-06-14 (×3): 1 via ORAL
  Filled 2013-06-12 (×4): qty 1

## 2013-06-12 MED ORDER — SODIUM CHLORIDE 0.9 % IV BOLUS (SEPSIS)
500.0000 mL | Freq: Once | INTRAVENOUS | Status: AC
  Administered 2013-06-12: 500 mL via INTRAVENOUS

## 2013-06-12 MED ORDER — ACETAMINOPHEN 650 MG RE SUPP: 650.0000 mg | Freq: Four times a day (QID) | RECTAL | Status: DC | PRN

## 2013-06-12 MED ORDER — LORATADINE 10 MG PO TABS
10.0000 mg | ORAL_TABLET | Freq: Every day | ORAL | Status: DC
Start: 2013-06-13 — End: 2013-06-15
  Administered 2013-06-13 – 2013-06-14 (×2): 10 mg via ORAL
  Filled 2013-06-12 (×3): qty 1

## 2013-06-12 MED ORDER — ALBUTEROL SULFATE (2.5 MG/3ML) 0.083% IN NEBU: 2.5000 mg | INHALATION_SOLUTION | Freq: Four times a day (QID) | RESPIRATORY_TRACT | Status: DC | PRN

## 2013-06-12 MED ORDER — PROPOFOL INFUSION 10 MG/ML OPTIME
INTRAVENOUS | Status: DC | PRN
  Administered 2013-06-12: 75 ug/kg/min via INTRAVENOUS

## 2013-06-12 MED ORDER — VITAMIN C 500 MG PO TABS
500.0000 mg | ORAL_TABLET | Freq: Every day | ORAL | Status: DC
  Administered 2013-06-12 – 2013-06-15 (×4): 500 mg via ORAL
  Filled 2013-06-12 (×4): qty 1

## 2013-06-12 MED ORDER — ZOLPIDEM TARTRATE 5 MG PO TABS
5.0000 mg | ORAL_TABLET | Freq: Every evening | ORAL | Status: DC | PRN
  Administered 2013-06-12 – 2013-06-13 (×2): 5 mg via ORAL
  Filled 2013-06-12 (×2): qty 1

## 2013-06-12 MED ORDER — BUPIVACAINE LIPOSOME 1.3 % IJ SUSP
20.0000 mL | Freq: Once | INTRAMUSCULAR | Status: DC
  Filled 2013-06-12: qty 20

## 2013-06-12 MED ORDER — SODIUM CHLORIDE 0.9 % IV SOLN
INTRAVENOUS | Status: DC | PRN
  Administered 2013-06-12: 1000 mL via INTRAMUSCULAR

## 2013-06-12 MED ORDER — BUPIVACAINE HCL (PF) 0.5 % IJ SOLN
INTRAMUSCULAR | Status: DC | PRN
  Administered 2013-06-12: 3 mL

## 2013-06-12 MED ORDER — HYDROMORPHONE HCL PF 1 MG/ML IJ SOLN
1.0000 mg | INTRAMUSCULAR | Status: DC | PRN
  Administered 2013-06-13 – 2013-06-14 (×2): 1 mg via INTRAVENOUS
  Filled 2013-06-12: qty 1

## 2013-06-12 MED ORDER — FLUTICASONE PROPIONATE 50 MCG/ACT NA SUSP
2.0000 | Freq: Every day | NASAL | Status: DC
  Administered 2013-06-13: 2 via NASAL
  Filled 2013-06-12: qty 16

## 2013-06-12 MED ORDER — SODIUM CHLORIDE 0.9 % IJ SOLN: Freq: Once | INTRAMUSCULAR | Status: DC

## 2013-06-12 MED ORDER — CLINDAMYCIN PHOSPHATE 900 MG/50ML IV SOLN
INTRAVENOUS | Status: AC
  Filled 2013-06-12: qty 50

## 2013-06-12 MED ORDER — MONTELUKAST SODIUM 10 MG PO TABS
10.0000 mg | ORAL_TABLET | Freq: Every evening | ORAL | Status: DC
  Administered 2013-06-12 – 2013-06-14 (×2): 10 mg via ORAL
  Filled 2013-06-12 (×4): qty 1

## 2013-06-12 MED ORDER — DOCUSATE SODIUM 100 MG PO CAPS
100.0000 mg | ORAL_CAPSULE | Freq: Two times a day (BID) | ORAL | Status: DC
  Administered 2013-06-12 – 2013-06-15 (×7): 100 mg via ORAL

## 2013-06-12 MED ORDER — PROMETHAZINE HCL 25 MG/ML IJ SOLN: 6.2500 mg | INTRAMUSCULAR | Status: DC | PRN

## 2013-06-12 MED ORDER — VITAMIN B-12 1000 MCG PO TABS
2000.0000 ug | ORAL_TABLET | Freq: Every day | ORAL | Status: DC
  Administered 2013-06-12 – 2013-06-15 (×4): 2000 ug via ORAL
  Filled 2013-06-12 (×4): qty 2

## 2013-06-12 MED ORDER — SODIUM CHLORIDE 0.9 % IJ SOLN
INTRAMUSCULAR | Status: AC
  Filled 2013-06-12: qty 50

## 2013-06-12 MED ORDER — SODIUM CHLORIDE 0.9 % IV SOLN
INTRAVENOUS | Status: DC
  Administered 2013-06-13 – 2013-06-15 (×3): via INTRAVENOUS

## 2013-06-12 MED ORDER — CLINDAMYCIN PHOSPHATE 600 MG/50ML IV SOLN
600.0000 mg | Freq: Four times a day (QID) | INTRAVENOUS | Status: AC
  Administered 2013-06-12 (×2): 600 mg via INTRAVENOUS
  Filled 2013-06-12 (×2): qty 50

## 2013-06-12 MED ORDER — POTASSIUM CHLORIDE ER 10 MEQ PO TBCR
10.0000 meq | EXTENDED_RELEASE_TABLET | Freq: Every day | ORAL | Status: DC
  Administered 2013-06-12 – 2013-06-15 (×4): 10 meq via ORAL
  Filled 2013-06-12 (×4): qty 1

## 2013-06-12 MED ORDER — HYDROMORPHONE HCL PF 1 MG/ML IJ SOLN
0.2500 mg | INTRAMUSCULAR | Status: DC | PRN
Start: 2013-06-12 — End: 2013-06-12

## 2013-06-12 MED ORDER — KETOROLAC TROMETHAMINE 30 MG/ML IJ SOLN
INTRAMUSCULAR | Status: DC | PRN
  Administered 2013-06-12: 30 mg via INTRAVENOUS

## 2013-06-12 MED ORDER — 0.9 % SODIUM CHLORIDE (POUR BTL) OPTIME
TOPICAL | Status: DC | PRN
  Administered 2013-06-12: 1000 mL

## 2013-06-12 MED ORDER — PANTOPRAZOLE SODIUM 40 MG PO TBEC
80.0000 mg | DELAYED_RELEASE_TABLET | Freq: Every day | ORAL | Status: DC
  Administered 2013-06-13 – 2013-06-15 (×3): 80 mg via ORAL
  Filled 2013-06-12 (×6): qty 2

## 2013-06-12 MED ORDER — MIDAZOLAM HCL 5 MG/5ML IJ SOLN
INTRAMUSCULAR | Status: DC | PRN
  Administered 2013-06-12: 2 mg via INTRAVENOUS

## 2013-06-12 MED ORDER — LACTATED RINGERS IV SOLN
INTRAVENOUS | Status: DC | PRN
  Administered 2013-06-12 (×2): via INTRAVENOUS

## 2013-06-12 MED ORDER — DEXAMETHASONE 4 MG PO TABS
10.0000 mg | ORAL_TABLET | Freq: Three times a day (TID) | ORAL | Status: AC
  Administered 2013-06-12 – 2013-06-13 (×3): 10 mg via ORAL
  Filled 2013-06-12 (×4): qty 1

## 2013-06-12 MED ORDER — DEXTROSE 5 % IV SOLN
500.0000 mg | Freq: Four times a day (QID) | INTRAVENOUS | Status: DC | PRN
  Administered 2013-06-12: 500 mg via INTRAVENOUS
  Filled 2013-06-12: qty 5

## 2013-06-12 MED ORDER — MIDAZOLAM HCL 2 MG/2ML IJ SOLN
INTRAMUSCULAR | Status: AC
Start: 2013-06-12 — End: 2013-06-12
  Filled 2013-06-12: qty 2

## 2013-06-12 MED ORDER — POLYETHYLENE GLYCOL 3350 17 G PO PACK
17.0000 g | PACK | Freq: Every day | ORAL | Status: DC | PRN
Start: 2013-06-12 — End: 2013-06-15

## 2013-06-12 MED ORDER — ONDANSETRON HCL 4 MG/2ML IJ SOLN
INTRAMUSCULAR | Status: DC | PRN
  Administered 2013-06-12: 4 mg via INTRAVENOUS

## 2013-06-12 MED ORDER — OXYCODONE HCL 5 MG PO TABS
5.0000 mg | ORAL_TABLET | ORAL | Status: DC | PRN
  Administered 2013-06-13: 5 mg via ORAL
  Filled 2013-06-12: qty 2
  Filled 2013-06-12: qty 1

## 2013-06-12 MED ORDER — LEVOTHYROXINE SODIUM 112 MCG PO TABS
112.0000 ug | ORAL_TABLET | Freq: Every day | ORAL | Status: DC
  Administered 2013-06-13 – 2013-06-15 (×3): 112 ug via ORAL
  Filled 2013-06-12 (×6): qty 1

## 2013-06-12 MED ORDER — PROPOFOL 10 MG/ML IV BOLUS
INTRAVENOUS | Status: DC | PRN
  Administered 2013-06-12: 30 mg via INTRAVENOUS

## 2013-06-12 MED ORDER — CLINDAMYCIN PHOSPHATE 900 MG/50ML IV SOLN
900.0000 mg | INTRAVENOUS | Status: AC
  Administered 2013-06-12: 900 mg via INTRAVENOUS

## 2013-06-12 SURGICAL SUPPLY — 38 items
BAG ZIPLOCK 12X15 (MISCELLANEOUS) IMPLANT
BENZOIN TINCTURE PRP APPL 2/3 (GAUZE/BANDAGES/DRESSINGS) ×2 IMPLANT
BLADE SAW SGTL 18X1.27X75 (BLADE) ×2 IMPLANT
CAPT HIP PF COP ×2 IMPLANT
CELLS DAT CNTRL 66122 CELL SVR (MISCELLANEOUS) ×1 IMPLANT
DRAPE C-ARM 42X120 X-RAY (DRAPES) ×2 IMPLANT
DRAPE STERI IOBAN 125X83 (DRAPES) ×2 IMPLANT
DRAPE U-SHAPE 47X51 STRL (DRAPES) ×6 IMPLANT
DRSG AQUACEL AG ADV 3.5X10 (GAUZE/BANDAGES/DRESSINGS) ×2 IMPLANT
DURAPREP 26ML APPLICATOR (WOUND CARE) ×2 IMPLANT
ELECT BLADE TIP CTD 4 INCH (ELECTRODE) ×2 IMPLANT
ELECT REM PT RETURN 9FT ADLT (ELECTROSURGICAL) ×2
ELECTRODE REM PT RTRN 9FT ADLT (ELECTROSURGICAL) ×1 IMPLANT
FACESHIELD LNG OPTICON STERILE (SAFETY) ×8 IMPLANT
GAUZE XEROFORM 5X9 LF (GAUZE/BANDAGES/DRESSINGS) IMPLANT
GLOVE BIO SURGEON STRL SZ7.5 (GLOVE) ×2 IMPLANT
GLOVE BIOGEL PI IND STRL 8 (GLOVE) ×2 IMPLANT
GLOVE BIOGEL PI INDICATOR 8 (GLOVE) ×2
GLOVE ECLIPSE 8.0 STRL XLNG CF (GLOVE) ×2 IMPLANT
GOWN STRL REUS W/TWL XL LVL3 (GOWN DISPOSABLE) ×4 IMPLANT
HANDPIECE INTERPULSE COAX TIP (DISPOSABLE) ×1
KIT BASIN OR (CUSTOM PROCEDURE TRAY) ×2 IMPLANT
PACK TOTAL JOINT (CUSTOM PROCEDURE TRAY) ×2 IMPLANT
PADDING CAST COTTON 6X4 STRL (CAST SUPPLIES) IMPLANT
RTRCTR WOUND ALEXIS 18CM MED (MISCELLANEOUS) ×2
SET HNDPC FAN SPRY TIP SCT (DISPOSABLE) ×1 IMPLANT
STAPLER VISISTAT 35W (STAPLE) IMPLANT
STRIP CLOSURE SKIN 1/2X4 (GAUZE/BANDAGES/DRESSINGS) ×2 IMPLANT
SUT ETHIBOND NAB CT1 #1 30IN (SUTURE) ×2 IMPLANT
SUT ETHILON 3 0 PS 1 (SUTURE) IMPLANT
SUT MNCRL AB 4-0 PS2 18 (SUTURE) ×2 IMPLANT
SUT VIC AB 0 CT1 36 (SUTURE) ×2 IMPLANT
SUT VIC AB 1 CT1 36 (SUTURE) ×2 IMPLANT
SUT VIC AB 2-0 CT1 27 (SUTURE) ×1
SUT VIC AB 2-0 CT1 TAPERPNT 27 (SUTURE) ×1 IMPLANT
TOWEL OR 17X26 10 PK STRL BLUE (TOWEL DISPOSABLE) ×2 IMPLANT
TOWEL OR NON WOVEN STRL DISP B (DISPOSABLE) ×2 IMPLANT
TRAY FOLEY CATH 14FRSI W/METER (CATHETERS) IMPLANT

## 2013-06-12 NOTE — Transfer of Care (Signed)
Immediate Anesthesia Transfer of Care Note  Patient: [name]  Procedure(s) Performed: Procedure(s): LEFT TOTAL HIP ARTHROPLASTY ANTERIOR APPROACH (Left)  Patient Location: PACU  Anesthesia Type:MAC and Spinal  Level of Consciousness: awake and sedated  Airway & Oxygen Therapy: Patient Spontanous Breathing and Patient connected to face mask oxygen  Post-op Assessment: Report given to PACU RN and Post -op Vital signs reviewed and stable  Post vital signs: Reviewed and stable  Complications: No apparent anesthesia complications

## 2013-06-12 NOTE — Anesthesia Postprocedure Evaluation (Signed)
Anesthesia Post Note  Patient: [name]  Procedure(s) Performed: Procedure(s) (LRB): LEFT TOTAL HIP ARTHROPLASTY ANTERIOR APPROACH (Left)  Anesthesia type: Spinal  Patient location: PACU  Post pain: Pain level controlled  Post assessment: Post-op Vital signs reviewed  Last Vitals: BP 145/100  Pulse 59  Temp(Src) 36.4 C (Oral)  Resp 26  SpO2 100%  Post vital signs: Reviewed  Level of consciousness: sedated  Complications: No apparent anesthesia complications

## 2013-06-12 NOTE — H&P (Signed)
TOTAL HIP ADMISSION H&P  Patient is admitted for left total hip arthroplasty.  Subjective:  Chief Complaint: left hip pain  HPI: [name], 68 y.o. female, has a history of pain and functional disability in the left hip(s) due to arthritis and patient has failed non-surgical conservative treatments for greater than 12 weeks to include NSAID's and/or analgesics, corticosteriod injections, use of assistive devices and activity modification.  Onset of symptoms was abrupt starting 1 years ago with rapidlly worsening course since that time.The patient noted no past surgery on the left hip(s).  Patient currently rates pain in the left hip at 10 out of 10 with activity. Patient has night pain, worsening of pain with activity and weight bearing, trendelenberg gait, pain that interfers with activities of daily living and pain with passive range of motion. Patient has evidence of subchondral sclerosis, periarticular osteophytes and joint space narrowing by imaging studies. This condition presents safety issues increasing the risk of falls.  There is no current active infection.  Patient Active Problem List   Diagnosis Date Noted  . Arthritis of left hip 06/12/2013  . Constipation 05/25/2013  . Orthostatic hypotension 01/27/2013  . crosstalk-atrial lead 01/27/2013  . Pre-syncope 01/02/2013  . Sinus tachycardia 01/01/2013  . Second degree heart block 12/27/2012  . Hypothyroidism 12/27/2012  . Spigelian hernia 04/20/2011  . DYSPNEA 05/09/2010  . BREAST CANCER 05/05/2010  . ASTHMA 05/05/2010  . HIATAL HERNIA 05/05/2010  . DEGENERATIVE Disk DISEASE 05/05/2010  . OSTEOPENIA 05/05/2010   Past Medical History  Diagnosis Date  . Diverticulosis   . GERD (gastroesophageal reflux disease)   . Esophageal stricture   . Hemorrhoids   . Mallory - Weiss tear     HEALED   . Interstitial cystitis   . ADHD (attention deficit hyperactivity disorder)   . Breast cancer     right side  . Blood  transfusion   . Thyroid disease   . Allergy     trees/pollen, mold, fungus, dust mites. Takes allergy shots  . Latex allergy, contact dermatitis   . Cataract   . Depression   . Hernia of abdominal wall     spigelian hernia RLQ - SURGERY TO REPAIR  . H/O hiatal hernia   . Neuromuscular disorder     central nervous system neuropathy- seen per Dr Erling Cruz  . Asthma     allergist Dr Olena Heckle- monthly allergy injections  . Complication of anesthesia     reaction to some anesthetics/ 7/12 anesth record on chart- states prefers epidural  . PONV (postoperative nausea and vomiting)     pt needs scop patch  . Recurrent upper respiratory infection (URI) 1/13- to present    bronchitis following surgery- states improved but still with cough. OV with Clearance Dr [name] 09/06/11 on chart  . Symptomatic bradycardia     a. 12/2012 s/p MDT dual chamber PPM, ser # [id number]; b. 12/2012 post-op course complicated by pericardial effusinon req lead revision.  . Pericardial effusion     a. 12/2012 following ppm placement;  b. 01/01/2013 Echo: EF 55-60%, small pericardial effusion w/o RV collapse-->No need for tap/window.  . Chest pain     a. 12/2012 Cath: nl cors, EF 55-65%.  . Autonomic dysfunction     CENTRAL NERVOUS SYSTEM NEUROPATHY - DX BY DR. Erling Cruz MORE THAN 10 YRS AGO - AND IT IS FELT TO CONTRIBUTE TO THE AUTOMIC  DYSFUNCTION-- PT HAS NUMBNESS LEGS AND FEET AND SOMETIIMES TIPS OF FINGER, SEVERE CONSTIPATION( NO SENSATION  TO HAVE BM ), DOUBLE VISION, ORTHOSTATIC HYPOTENSION  . Hypothyroidism   . Bruises easily   . Pacemaker   . Dysrhythmia     HX OF HIGH GRADE HEART BLOCK - REQUIRED PACEMAKER INSERTION  . Shortness of breath     AT TIMES - BUT MUCH IMPROVED AFTER PACEMAKER WAS REPROGRAMED.  . Arthritis     PAIN AND OA LEFT HIP    Past Surgical History  Procedure Laterality Date  . Mastectomy modified radical      right; with immediate reconstruction  . Laparoscopy  1973  . Abdominal hysterectomy  1974   . Cystoscopy  1975, 2007  . Myringoplasty  1962  . Tympanoplasty  1973    right  . Oophorectomy  1982  . Bladder suspension    . Rectocele repair      with cystocele repair  . Total hip arthroplasty Right   . Ventral hernia repair  05/30/2011    Procedure: HERNIA REPAIR VENTRAL ADULT;  Surgeon: Haywood Lasso, MD;  Location: [facility];  Service: General;  Laterality: Right;  repair right spigelian hernia  . Breast biopsy  2002    NO BLOOD PRESSURES ON RIGHT SIDE/   s/p  axillary node dissection  . Tonsillectomy    . Back surgery      cervical fusion 4-5 with plate  . Cysto with hydrodistension  09/07/2011    Procedure: CYSTOSCOPY/HYDRODISTENSION;  Surgeon: Ailene Rud, MD;  Location: [facility];  Service: Urology;  Laterality: N/A;  INSTILLATION OF MARCAINE/PYRIDIUM INSTILLATION OF MARCAINE/KENALOG  . Pacemaker insertion    . Eye surgery      LASIK EYE SURGERY BILATERAL  . Ulnar nerve transposition Right     Prescriptions prior to admission  Medication Sig Dispense Refill  . ALPHA LIPOIC ACID PO Take 200 mg by mouth daily.      Marland Kitchen atenolol (TENORMIN) 25 MG tablet Take 25 mg by mouth every morning.      Marland Kitchen b complex vitamins capsule Take 1 capsule by mouth daily.       Marland Kitchen buPROPion (WELLBUTRIN XL) 150 MG 24 hr tablet Take 150 mg by mouth every morning.      . cholecalciferol (VITAMIN D) 1000 UNITS tablet Take 1,000 Units by mouth daily.      . ciclesonide (OMNARIS) 50 MCG/ACT nasal spray Place 2 sprays into both nostrils 2 (two) times daily.      . clonazePAM (KLONOPIN) 1 MG tablet Take 1 mg by mouth at bedtime as needed. For insomnia      . cyanocobalamin 2000 MCG tablet Take 2,000 mcg by mouth daily.      Marland Kitchen docusate sodium (COLACE) 100 MG capsule Take 200 mg by mouth every evening.       . DULoxetine (CYMBALTA) 60 MG capsule Take 60 mg by mouth every morning.      Marland Kitchen esomeprazole (NEXIUM) 40 MG capsule Take 40 mg by mouth daily.       Marland Kitchen estradiol  (VIVELLE-DOT) 0.025 MG/24HR Place 1 patch onto the skin 2 (two) times a week. Applies new patch on Sunday & Thursday      . fexofenadine (ALLEGRA) 180 MG tablet Take 180 mg by mouth daily.       Marland Kitchen FIBER SELECT GUMMIES PO Take 2 tablets by mouth 2 (two) times daily.      . fish oil-omega-3 fatty acids 1000 MG capsule Take 1 g by mouth daily.       Marland Kitchen  lactobacillus acidophilus & bulgar (LACTINEX) chewable tablet Chew 1 tablet by mouth every evening.      Marland Kitchen levothyroxine (SYNTHROID, LEVOTHROID) 112 MCG tablet Take 112 mcg by mouth every morning.       . magnesium oxide (MAG-OX) 400 MG tablet Take 400 mg by mouth daily.      . montelukast (SINGULAIR) 10 MG tablet Take 10 mg by mouth every evening.       . Multiple Vitamins-Minerals (ICAPS PO) Take 1 tablet by mouth daily.      . potassium chloride (K-DUR) 10 MEQ tablet Take 1 tablet (10 mEq total) by mouth daily.  90 tablet  3  . Probiotic Product (ALIGN) 4 MG CAPS Take 4 mg by mouth at bedtime.       . vitamin C (ASCORBIC ACID) 500 MG tablet Take 500 mg by mouth daily.      Marland Kitchen albuterol (PROVENTIL HFA;VENTOLIN HFA) 108 (90 BASE) MCG/ACT inhaler Inhale 2 puffs into the lungs every 6 (six) hours as needed. For shortness of breath      . estradiol (ESTRING) 2 MG vaginal ring Place 2 mg vaginally every 3 (three) months. follow package directions      . PRESCRIPTION MEDICATION ALLERGY SHOT 2 TIMES A WEEK BY DR. Neldon Mc       Allergies  Allergen Reactions  . Anesthetics, Amide Other (See Comments)    Patient unsure of names, however multiples cause swelling of airway & nausea  . Sulfonamide Derivatives Anaphylaxis and Swelling  . Azithromycin Other (See Comments)    Severe gastritis   . Bactroban Other (See Comments)    Causes sores in nose  . Ciprofloxacin     REACTION: joint swelling  . Codeine Swelling  . Meperidine Hcl Nausea Only    Hallucinations   . Morphine Nausea Only    Hallucinations   . Neosporin [Neomycin-Polymyxin-Gramicidin] Hives  and Dermatitis    All topical "orin's ointment"  . Nitrofurantoin     REACTION: neuropathy in legs  . Other     ALLERGIC TO WARM WATER SHELLFISH  PT STATES PLEASE NOTE SHE CAN TAKE OXYCODONE AND TYLENOL FOR PAIN -NOT ALLERGIC  . Penicillins Other (See Comments)    Swelling in joints  . Latex Rash    History  Substance Use Topics  . Smoking status: Never Smoker   . Smokeless tobacco: Never Used  . Alcohol Use: 4.2 oz/week    7 Glasses of wine per week     Comment: nightly    Family History  Problem Relation Age of Onset  . Heart disease Father   . Diabetes      aunt  . Uterine cancer      grandmother  . Colon cancer Mother   . Depression Mother   . Cancer Mother     colon  . Pancreatic cancer Sister   . Cancer Sister     pancreatic  . Cancer Maternal Grandmother     ovarian     Review of Systems  All other systems reviewed and are negative.    Objective:  Physical Exam  Constitutional: She is oriented to person, place, and time. She appears well-developed and well-nourished.  HENT:  Head: Normocephalic and atraumatic.  Eyes: EOM are normal. Pupils are equal, round, and reactive to light.  Neck: Normal range of motion. Neck supple.  Cardiovascular: Normal rate and regular rhythm.   Respiratory: Effort normal and breath sounds normal.  GI: Soft. Bowel sounds are normal.  Musculoskeletal:  Left hip: She exhibits decreased range of motion, decreased strength and bony tenderness.  Neurological: She is alert and oriented to person, place, and time.  Skin: Skin is warm and dry.  Psychiatric: She has a normal mood and affect.    Vital signs in last 24 hours: Temp:  [97.5 F (36.4 C)] 97.5 F (36.4 C) (01/30 0549) Pulse Rate:  [66] 66 (01/30 0549) Resp:  [18] 18 (01/30 0549) BP: (107)/(63) 107/63 mmHg (01/30 0549) SpO2:  [100 %] 100 % (01/30 0549)  Labs:   Estimated body mass index is 22.77 kg/(m^2) as calculated from the following:   Height as of  06/08/13: 5\' 7"  (1.702 m).   Weight as of 05/25/13: 65.953 kg (145 lb 6.4 oz).   Imaging Review Plain radiographs demonstrate severe degenerative joint disease of the left hip(s). The bone quality appears to be good for age and reported activity level.  Assessment/Plan:  End stage arthritis, left hip(s)  The patient history, physical examination, clinical judgement of the provider and imaging studies are consistent with end stage degenerative joint disease of the left hip(s) and total hip arthroplasty is deemed medically necessary. The treatment options including medical management, injection therapy, arthroscopy and arthroplasty were discussed at length. The risks and benefits of total hip arthroplasty were presented and reviewed. The risks due to aseptic loosening, infection, stiffness, dislocation/subluxation,  thromboembolic complications and other imponderables were discussed.  The patient acknowledged the explanation, agreed to proceed with the plan and consent was signed. Patient is being admitted for inpatient treatment for surgery, pain control, PT, OT, prophylactic antibiotics, VTE prophylaxis, progressive ambulation and ADL's and discharge planning.The patient is planning to be discharged to skilled nursing facility

## 2013-06-12 NOTE — Anesthesia Procedure Notes (Signed)
Spinal  Patient location during procedure: OR Start time: 06/12/2013 7:37 AM End time: 06/12/2013 7:47 AM Staffing Anesthesiologist: Nolon Nations R Performed by: anesthesiologist  Preanesthetic Checklist Completed: patient identified, site marked, surgical consent, pre-op evaluation, timeout performed, IV checked, risks and benefits discussed and monitors and equipment checked Spinal Block Patient position: sitting Prep: ChloraPrep Patient monitoring: heart rate, continuous pulse ox and blood pressure Approach: right paramedian Location: L2-3 Injection technique: single-shot Needle Needle type: Sprotte  Needle gauge: 24 G Needle length: 9 cm Assessment Sensory level: T8 Additional Notes Expiration date of kit checked and confirmed. Patient tolerated procedure well, without complications.

## 2013-06-12 NOTE — Progress Notes (Signed)
UR completed 

## 2013-06-12 NOTE — Brief Op Note (Signed)
06/12/2013  8:54 AM  PATIENT:  [name]  68 y.o. female  PRE-OPERATIVE DIAGNOSIS:  OSTEOARTHRITIS LEFT HIP  POST-OPERATIVE DIAGNOSIS:  OSTEOARTHRITIS LEFT HIP  PROCEDURE:  Procedure(s): LEFT TOTAL HIP ARTHROPLASTY ANTERIOR APPROACH (Left)  SURGEON:  Surgeon(s) and Role:    * Mcarthur Rossetti, MD - Primary  PHYSICIAN ASSISTANT: Benita Stabile, PA-C  ANESTHESIA:   spinal  EBL:  Total I/O In: 1000 [I.V.:1000] Out: 300 [Blood:300]  BLOOD ADMINISTERED:none  DRAINS: none   LOCAL MEDICATIONS USED:  OTHER Experil  SPECIMEN:  No Specimen  DISPOSITION OF SPECIMEN:  N/A  COUNTS:  YES  TOURNIQUET:  * No tourniquets in log *  DICTATION: .Other Dictation: Dictation Number [id number]  PLAN OF CARE: Admit to inpatient   PATIENT DISPOSITION:  PACU - hemodynamically stable.   Delay start of Pharmacological VTE agent (>24hrs) due to surgical blood loss or risk of bleeding: no

## 2013-06-13 LAB — BASIC METABOLIC PANEL
BUN: 13 mg/dL (ref 6–23)
CHLORIDE: 105 meq/L (ref 96–112)
CO2: 24 meq/L (ref 19–32)
Calcium: 8.1 mg/dL — ABNORMAL LOW (ref 8.4–10.5)
Creatinine, Ser: 0.6 mg/dL (ref 0.50–1.10)
GFR calc Af Amer: >90 mL/min (ref >90)
GFR calc non Af Amer: >90 mL/min (ref >90)
Glucose, Bld: 153 mg/dL — ABNORMAL HIGH (ref 70–99)
POTASSIUM: 4.3 meq/L (ref 3.7–5.3)
SODIUM: 138 meq/L (ref 137–147)

## 2013-06-13 LAB — CBC
HCT: 31.9 % — ABNORMAL LOW (ref 36.0–46.0)
Hemoglobin: 10.6 g/dL — ABNORMAL LOW (ref 12.0–15.0)
MCH: 30.9 pg (ref 26.0–34.0)
MCHC: 33.2 g/dL (ref 30.0–36.0)
MCV: 93 fL (ref 78.0–100.0)
Platelets: 149 10*3/uL — ABNORMAL LOW (ref 150–400)
RBC: 3.43 MIL/uL — AB (ref 3.87–5.11)
RDW: 13.3 % (ref 11.5–15.5)
WBC: 9.1 10*3/uL (ref 4.0–10.5)

## 2013-06-13 MED ORDER — GLYCERIN (LAXATIVE) 2.1 G RE SUPP
1.0000 | Freq: Every day | RECTAL | Status: DC | PRN
  Administered 2013-06-13: 1 via RECTAL
  Filled 2013-06-13 (×2): qty 1

## 2013-06-13 NOTE — Op Note (Signed)
NAMEDEMIYAH, FISCHBACH          ACCOUNT NO.:  [id number]  MEDICAL RECORD NO.:  [id number]  LOCATION:  54                         FACILITY:  [facility]  PHYSICIAN:  Lind Guest. Ninfa Linden, M.D.DATE OF BIRTH:  Jan 11, 1946  DATE OF PROCEDURE:  06/12/2013 DATE OF DISCHARGE:                              OPERATIVE REPORT   PREOPERATIVE DIAGNOSIS:  Severe degenerative joint disease and end-stage arthritis, left hip.  POSTOPERATIVE DIAGNOSIS:  Severe degenerative joint disease and end- stage arthritis, left hip.  PROCEDURE:  Left total hip arthroplasty through direct anterior approach.  IMPLANTS:  DePuy Sector Gription acetabular component size 50, apex hole eliminator guide, size 32+ 4 neutral polyethylene liner, size 12 Corail femoral component with standard offset, size 32+ 1 ceramic hip ball.  SURGEON:  Lind Guest. Ninfa Linden, M.D.  ASSISTANT:  Erskine Emery, PA-C.  ANESTHESIA:  Spinal.  ESTIMATED BLOOD LOSS:  300 to 350 mL.  ANTIBIOTICS:  900 mg of IV clindamycin.  COMPLICATIONS:  None.  INDICATIONS:  Ms. Zipp is a 68 year old female with worsening arthritis of her left hip.  She has had a previous right total hip arthroplasty in 2012 and now wishes to proceed with it on her left side due to worsening pain and x-ray showing loss of joint space, periarticular osteophytes, and subchondral sclerosis.  She has failed conservative treatment, and at this point given the success of her right total hip arthroplasty wishes to proceed with this on the left.  The risks and benefits of surgery have been explained to her in detail, and she does wish to proceed.  PROCEDURE DESCRIPTION:  After informed consent was obtained, appropriate left hip was marked.  She was brought to the operating room and while she was on her stretcher, spinal anesthesia was obtained, a Foley catheter was placed, and both feet had traction boots applied to them. Next, she was placed supine on the [facility]  fracture table with the perineal post in place and both legs in inline skeletal traction devices, but no traction applied.  Her left operative hip was then prepped and draped with DuraPrep and sterile drapes.  A time-out was called and she was identified as correct patient, correct left hip.  We then made an incision just inferior and posterior to the anterior-superior iliac spine and carried this obliquely down the leg.  We dissected down to the tensor fascia lata muscle and tensor fascia was divided longitudinally. We then proceeded with a direct anterior approach to the hip.  We cauterized the lateral femoral circumflex vessels and placed Cobra retractors around the medial and lateral neck of the femur.  We then opened up the hip capsule in L-type format and found a large joint effusion.  We placed the Cobra retractors within the hip capsule and then made our femoral neck cut with an oscillating saw just proximal to the lesser trochanter and completed this with an osteotome.  We placed a corkscrew guide in the femoral head and removed the femoral head in its entirety.  It was devoid of cartilage.  We then cleaned the acetabulum debris and remnants of acetabular labrum.  We placed a bent Hohmann medially and a Cobra retractor laterally.  I then began reaming from a size  42 in 2 mm increments up to a size 50 with all reamers under direct visualization and the last reamer also under direct fluoroscopy so we could obtain our depth of reaming, inclination, and anteversion.  With this, we were able to place the real DePuy Sector Gription acetabular component size 50 to the apex hole eliminator guide and 32+ 4 neutral polyethylene liner for size 50 acetabular component.  Attention was then turned to the femur with the leg externally rotated to 100 degrees extended and adducted.  We were able to place a Mueller retractor medially and a Hohmann retractor behind the greater trochanter.   I released the lateral capsule and then used a box cutting osteotome and entered the femoral canal and a rongeur to lateralize.  Then began broaching from a size 8 broach using Corail broaching system up to a size 12, which actually mashed to other side. We used a calcar planer off this and trailed a standard neck and a 32+ 1 trial hip ball.  We brought the leg back over and up with traction and internal rotation, reduced it in the pelvis, and leg lengths were measured equal under direct fluoroscopy as well as her offsets and her range of motion was stable and even to 100 degrees external rotation, I could not dislocate it.  I then dislocated the hip and removed the trial components and placed the real size 12 Corail femoral component with standard offset, the real 32+ 1 ceramic hip ball.  We reduced this back in the acetabulum and again it was stable.  We copiously irrigated the soft tissues with normal saline solution and closed the joint capsule with interrupted #1 Ethibond suture.  We then infiltrated Exparel all around the joint capsule and soft tissues to help with her pain control.  We then closed the tensor fascia with running #1 Vicryl suture followed by 0-Vicryl in the deep tissue, 2-0 Vicryl in the subcutaneous tissue, 4-0 Monocryl subcuticular stitch, and Steri-Strips on the skin.  Aquacel dressing was applied.  She was taken off the [facility] table and to the recovery room in stable condition.  All final counts were correct, and there were no complications noted.  Of note, Erskine Emery, PA-C was present during the entire case and his presence was crucial for patient positioning, retracting, and closure of the wound.     Lind Guest. Ninfa Linden, M.D.     CYB/MEDQ  D:  06/12/2013  T:  06/13/2013  Job:  [id number]

## 2013-06-13 NOTE — Progress Notes (Signed)
Physical Therapy Treatment Patient Details Name: [name] MRN: [id number] DOB: July 06, 1945 Today's Date: 06/13/2013 Time: 1017-5102 PT Time Calculation (min): 22 min  PT Assessment / Plan / Recommendation  History of Present Illness 68 yo female s/p LT THR direct anterior   PT Comments   Progressing extremely well with min pain.  Follow Up Recommendations  Home health PT     Does the patient have the potential to tolerate intense rehabilitation     Barriers to Discharge        Equipment Recommendations  None recommended by PT    Recommendations for Other Services OT consult  Frequency 7X/week   Progress towards PT Goals Progress towards PT goals: Progressing toward goals  Plan Current plan remains appropriate    Precautions / Restrictions Precautions Precautions: None Restrictions Weight Bearing Restrictions: No Other Position/Activity Restrictions: WBAT   Pertinent Vitals/Pain 2/10    Mobility  Bed Mobility Overal bed mobility: Needs Assistance Bed Mobility: Supine to Sit;Sit to Supine Supine to sit: Min assist General bed mobility comments: min assist with L LE into bed Transfers Overall transfer level: Modified independent Equipment used: Rolling walker (2 wheeled) Transfers: Sit to/from Stand Sit to Stand: Modified independent (Device/Increase time) Ambulation/Gait Ambulation/Gait assistance: Min guard Ambulation Distance (Feet): 400 Feet Assistive device: Rolling walker (2 wheeled) Gait Pattern/deviations: Step-through pattern;Antalgic General Gait Details: min cues for posture and position from RW    Exercises     PT Diagnosis:    PT Problem List:   PT Treatment Interventions:     PT Goals (current goals can now be found in the care plan section) Acute Rehab PT Goals Patient Stated Goal: Back to yoga class PT Goal Formulation: With patient Time For Goal Achievement: 06/20/13 Potential to Achieve Goals: Good  Visit Information  Last  PT Received On: 06/13/13 Assistance Needed: +1 History of Present Illness: 68 yo female s/p LT THR direct anterior    Subjective Data  Subjective: This is so much better than the first one 2 yrs ago Patient Stated Goal: Back to yoga class   Cognition  Cognition Arousal/Alertness: Awake/alert Behavior During Therapy: WFL for tasks assessed/performed Overall Cognitive Status: Within Functional Limits for tasks assessed    Balance     End of Session PT - End of Session Equipment Utilized During Treatment: Gait belt Activity Tolerance: Patient tolerated treatment well Patient left: in bed;with call bell/phone within reach Nurse Communication: Mobility status   GP     [name] 06/13/2013, 3:21 PM

## 2013-06-13 NOTE — Evaluation (Signed)
Physical Therapy Evaluation Patient Details Name: [name] MRN: [id number] DOB: 08/05/1945 Today's Date: 06/13/2013 Time: 3546-5681 PT Time Calculation (min): 30 min  PT Assessment / Plan / Recommendation History of Present Illness  68 yo female s/p LT THR direct anterior  Clinical Impression  Pt s/p L THR presents with decreased L LE strength/ROM and post op discomfort limiting functional mobility.  Pt should progress well to d/c home with family assist and HHPT follow up.   PT Assessment  Patient needs continued PT services    Follow Up Recommendations  Home health PT    Does the patient have the potential to tolerate intense rehabilitation      Barriers to Discharge        Equipment Recommendations  None recommended by PT    Recommendations for Other Services OT consult   Frequency 7X/week    Precautions / Restrictions Precautions Precautions: None Restrictions Weight Bearing Restrictions: No Other Position/Activity Restrictions: WBAT   Pertinent Vitals/Pain 2/10; premed; ice pack provided      Mobility  Bed Mobility Overal bed mobility: Needs Assistance Bed Mobility: Supine to Sit Supine to sit: Min guard General bed mobility comments: min cues for use of R LE to self assist Transfers Overall transfer level: Modified independent Equipment used: Rolling walker (2 wheeled) Transfers: Sit to/from Stand Sit to Stand: Min guard General transfer comment: cues for LE management and use of UEs to self assist Ambulation/Gait Ambulation/Gait assistance: Min guard Ambulation Distance (Feet): 222 Feet Assistive device: Rolling walker (2 wheeled) Gait Pattern/deviations: Step-to pattern;Step-through pattern;Antalgic General Gait Details: min cues for posture and position from RW    Exercises Total Joint Exercises Ankle Circles/Pumps: AROM;15 reps;Supine;Both Quad Sets: AROM;Both;10 reps;Supine Heel Slides: AAROM;15 reps;Supine;Left Hip  ABduction/ADduction: AAROM;Left;15 reps;Supine   PT Diagnosis: Difficulty walking  PT Problem List: Decreased strength;Decreased range of motion;Decreased activity tolerance;Decreased mobility;Decreased knowledge of use of DME;Pain PT Treatment Interventions: DME instruction;Gait training;Stair training;Functional mobility training;Therapeutic activities;Therapeutic exercise;Patient/family education     PT Goals(Current goals can be found in the care plan section) Acute Rehab PT Goals Patient Stated Goal: Back to yoga class PT Goal Formulation: With patient Time For Goal Achievement: 06/20/13 Potential to Achieve Goals: Good  Visit Information  Last PT Received On: 06/13/13 Assistance Needed: +1 History of Present Illness: 68 yo female s/p LT THR direct anterior       Prior [place] expects to be discharged to:: Private residence Living Arrangements: Spouse/significant other Available Help at Discharge: Family Type of Home: House Home Access: Scientist, research (physical sciences) of Steps: 3 Entrance Stairs-Rails: None Home Layout: Multi-level Alternate Level Stairs-Number of Steps: elevator Home Equipment: Walker - 4 wheels;Walker - 2 wheels Prior Function Level of Independence: Independent Communication Communication: No difficulties Dominant Hand: Right    Cognition  Cognition Arousal/Alertness: Awake/alert Behavior During Therapy: WFL for tasks assessed/performed Overall Cognitive Status: Within Functional Limits for tasks assessed    Extremity/Trunk Assessment Upper Extremity Assessment Upper Extremity Assessment: Overall WFL for tasks assessed Lower Extremity Assessment Lower Extremity Assessment: Defer to PT evaluation LLE Deficits / Details: 3-/5 hip strength; AAROM at hip to 105 flex and 35 abd Cervical / Trunk Assessment Cervical / Trunk Assessment: Normal   Balance    End of Session PT - End of Session Equipment Utilized During  Treatment: Gait belt Activity Tolerance: Patient tolerated treatment well Patient left: in chair;with call bell/phone within reach;with family/visitor present Nurse Communication: Mobility status  GP  Chesky Heyer 06/13/2013, 11:06 AM

## 2013-06-13 NOTE — Evaluation (Signed)
Occupational Therapy Evaluation Patient Details Name: [name] MRN: [id number] DOB: Sep 27, 1945 Today's Date: 06/13/2013 Time: 1011-1030 OT Time Calculation (min): 19 min  OT Assessment / Plan / Recommendation History of present illness 68 yo female s/p LT THR direct anterior   Clinical Impression                                                                                  All education is complete and patient indicates understanding using teach back. Pt is at adequate level for dc to  home without follow up required.. Pt demonstrates modified independent  overall with ADLS. OT sign off acutely      OT Assessment  Patient does not need any further OT services    Follow Up Recommendations  No OT follow up    Barriers to Discharge      Equipment Recommendations  None recommended by OT    Recommendations for Other Services    Frequency       Precautions / Restrictions Precautions Precautions: None   Pertinent Vitals/Pain Requesting foley d/c RN notified     ADL  Eating/Feeding: Independent Where Assessed - Eating/Feeding: Chair Grooming: Wash/dry hands;Wash/dry face;Teeth care;Modified independent Where Assessed - Grooming: Unsupported standing Lower Body Dressing: Modified independent Where Assessed - Lower Body Dressing: Unsupported sit to stand Toilet Transfer: Modified independent Toilet Transfer Method: Sit to Loss adjuster, chartered: Regular height toilet Tub/Shower Transfer: Modified independent Tub/Shower Transfer Method: Therapist, art: Walk in Engineer, site Used: Rolling walker Transfers/Ambulation Related to ADLs: MOD I during OT session ADL Comments: Pt is able to perform all adls at MOD I level and will have (A) of husband if needed. Ot to sign off acutely. pt demonstrated don doff sock on LT LE this session    OT Diagnosis:    OT Problem List:   OT Treatment Interventions:     OT Goals(Current  goals can be found in the care plan section)    Visit Information  Last OT Received On: 06/13/13 Assistance Needed: +1 History of Present Illness: 68 yo female s/p LT THR direct anterior       Prior [place] expects to be discharged to:: Private residence Living Arrangements: Spouse/significant other Available Help at Discharge: Family Type of Home: House Home Access: [place]: Environmental consultant - 4 wheels;Walker - 2 wheels Prior Function Level of Independence: Independent Communication Communication: No difficulties Dominant Hand: Right         Vision/Perception Vision - History Baseline Vision: Wears glasses only for reading Patient Visual Report: No change from baseline   Cognition  Cognition Arousal/Alertness: Awake/alert Behavior During Therapy: WFL for tasks assessed/performed Overall Cognitive Status: Within Functional Limits for tasks assessed    Extremity/Trunk Assessment Upper Extremity Assessment Upper Extremity Assessment: Overall WFL for tasks assessed Lower Extremity Assessment Lower Extremity Assessment: Defer to PT evaluation Cervical / Trunk Assessment Cervical / Trunk Assessment: Normal     Mobility Transfers Overall transfer level: Modified independent Equipment used: Rolling walker (2 wheeled)     Exercise     Balance     End of Session  OT - End of Session Activity Tolerance: Patient tolerated treatment well Patient left: in chair;with call bell/phone within reach Nurse Communication: Mobility status  GO     Peri Maris 06/13/2013, 10:33 AM Pager: [phone]

## 2013-06-13 NOTE — Progress Notes (Signed)
   Subjective:  Patient reports pain as mild.  Feels like she has a rectal tear.  Objective:   VITALS:   Filed Vitals:   06/12/13 2148 06/13/13 0200 06/13/13 0548 06/13/13 0800  BP: 106/67 95/58 109/51   Pulse: 78 73 66   Temp: 98.2 F (36.8 C) 98.5 F (36.9 C) 98.3 F (36.8 C)   TempSrc: Oral Oral Oral   Resp: 16 16 18 18   Height:      Weight:      SpO2: 97% 91% 98% 98%    Neurologically intact Neurovascular intact Sensation intact distally Intact pulses distally Dorsiflexion/Plantar flexion intact Incision: dressing C/D/I and no drainage No cellulitis present Compartment soft   Lab Results  Component Value Date   WBC 9.1 06/13/2013   HGB 10.6* 06/13/2013   HCT 31.9* 06/13/2013   MCV 93.0 06/13/2013   PLT 149* 06/13/2013     Assessment/Plan: 1 Day Post-Op   Problem List Items Addressed This Visit   None      Advance diet Up with PT/OT DVT ppx - SCDs, ambulation, asa WBAT left lower extremity Foley out this am Patient already has follow up for rectal tear issues and declined any services while in house Suppository ordered Hgb stable Pain control Discharge planning   Marianna Payment 06/13/2013, 9:03 AM [phone]

## 2013-06-13 NOTE — Plan of Care (Signed)
Problem: Consults Goal: Diagnosis- Total Joint Replacement Outcome: Completed/Met Date Met:  06/13/13 Primary Total Hip LEFT, Anterior

## 2013-06-14 LAB — CBC
HEMATOCRIT: 28.2 % — AB (ref 36.0–46.0)
HEMOGLOBIN: 9.7 g/dL — AB (ref 12.0–15.0)
MCH: 32.1 pg (ref 26.0–34.0)
MCHC: 34.4 g/dL (ref 30.0–36.0)
MCV: 93.4 fL (ref 78.0–100.0)
PLATELETS: 135 10*3/uL — AB (ref 150–400)
RBC: 3.02 MIL/uL — ABNORMAL LOW (ref 3.87–5.11)
RDW: 13.7 % (ref 11.5–15.5)
WBC: 6.8 10*3/uL (ref 4.0–10.5)

## 2013-06-14 MED ORDER — OXYCODONE HCL 5 MG PO TABS: 5.0000 mg | ORAL_TABLET | ORAL | Status: DC | PRN

## 2013-06-14 MED ORDER — ASPIRIN 325 MG PO TBEC: 325.0000 mg | DELAYED_RELEASE_TABLET | Freq: Two times a day (BID) | ORAL | Status: DC

## 2013-06-14 MED ORDER — ZOLPIDEM TARTRATE 10 MG PO TABS
10.0000 mg | ORAL_TABLET | Freq: Every evening | ORAL | Status: DC | PRN
  Administered 2013-06-14: 10 mg via ORAL
  Filled 2013-06-14: qty 1

## 2013-06-14 NOTE — Progress Notes (Signed)
PT Cancellation Note  Patient Details Name: [name] MRN: [id number] DOB: Sep 25, 1945   Cancelled Treatment:     Pt was sleeping well in bed this afternoon when I checked on her for exercises. She was finally getting comfortable and resting well, so politely declined session and feel this is fine due to pt is progressing nicely.     [name] 06/14/2013, 4:46 PM

## 2013-06-14 NOTE — Progress Notes (Signed)
Physical Therapy Treatment Patient Details Name: [name] MRN: [id number] DOB: 1945-11-02 Today's Date: 06/14/2013 Time: 1200-1215 PT Time Calculation (min): 15 min  PT Assessment / Plan / Recommendation  History of Present Illness 68 yo female s/p LT THR direct anterior   PT Comments   Pt was getting up with husband by her side. Doing very well with mobility and a little more sore today, however ambulated step over step with RW , tolerated well. Did just want to rest after session due to long night without rest last night.  Will check on pt this afternoon for exercises.   Follow Up Recommendations        Does the patient have the potential to tolerate intense rehabilitation     Barriers to Discharge        Equipment Recommendations       Recommendations for Other Services    Frequency     Progress towards PT Goals Progress towards PT goals: Progressing toward goals  Plan      Precautions / Restrictions Precautions Precautions: None Restrictions Other Position/Activity Restrictions: WBAT   Pertinent Vitals/Pain Very little pain in hip.     Mobility  Bed Mobility Sit to supine: Min assist (for LEs) Transfers Overall transfer level: Modified independent Equipment used: Rolling walker (2 wheeled) Transfers: Sit to/from Stand Sit to Stand: Modified independent (Device/Increase time) Ambulation/Gait Ambulation/Gait assistance: Supervision Ambulation Distance (Feet): 300 Feet Assistive device: Rolling walker (2 wheeled) Gait Pattern/deviations: WFL(Within Functional Limits) Gait velocity: good pace    Exercises     PT Diagnosis:    PT Problem List:   PT Treatment Interventions:     PT Goals (current goals can now be found in the care plan section)    Visit Information  Last PT Received On: 06/14/13 Assistance Needed: +1 History of Present Illness: 68 yo female s/p LT THR direct anterior    Subjective Data      Cognition   Cognition Arousal/Alertness: Awake/alert Behavior During Therapy: WFL for tasks assessed/performed    Balance     End of Session PT - End of Session Activity Tolerance: Patient tolerated treatment well Patient left: in bed;with call bell/phone within reach   GP     Olamide Lahaie, [facility] 06/14/2013, 4:44 PM [name], PT Pager: [phone] 06/14/2013

## 2013-06-14 NOTE — Progress Notes (Addendum)
Subjective:  Patient has had insomnia.  Wants higher dose of ambien.  Objective:   VITALS:   Filed Vitals:   06/13/13 2000 06/13/13 2123 06/14/13 0000 06/14/13 0400  BP:  117/66    Pulse:  70    Temp:  98 F (36.7 C)    TempSrc:  Oral    Resp: 20 18 20 20   Height:      Weight:      SpO2: 94% 99% 93% 94%    Neurologically intact Neurovascular intact Sensation intact distally Intact pulses distally Dorsiflexion/Plantar flexion intact Incision: dressing C/D/I and no drainage No cellulitis present Compartment soft   Lab Results  Component Value Date   WBC 9.1 06/13/2013   HGB 10.6* 06/13/2013   HCT 31.9* 06/13/2013   MCV 93.0 06/13/2013   PLT 149* 06/13/2013     Assessment/Plan: 2 Days Post-Op   Problem List Items Addressed This Visit     Other   *Arthritis of left hip - Primary   Relevant Orders      Weight bearing as tolerated      Up with PT/OT DVT ppx - SCDs, ambulation, asa WBAT left lower extremity Suppository ordered Expected postop acute blood loss anemia - will monitor for symptoms Pain control Discharge planning Rx in chart   [name] 06/14/2013, 6:35 AM [phone]

## 2013-06-15 LAB — CBC
HCT: 27 % — ABNORMAL LOW (ref 36.0–46.0)
HEMOGLOBIN: 9.3 g/dL — AB (ref 12.0–15.0)
MCH: 32.2 pg (ref 26.0–34.0)
MCHC: 34.4 g/dL (ref 30.0–36.0)
MCV: 93.4 fL (ref 78.0–100.0)
Platelets: 137 10*3/uL — ABNORMAL LOW (ref 150–400)
RBC: 2.89 MIL/uL — ABNORMAL LOW (ref 3.87–5.11)
RDW: 13.3 % (ref 11.5–15.5)
WBC: 6.4 10*3/uL (ref 4.0–10.5)

## 2013-06-15 MED ORDER — METHOCARBAMOL 500 MG PO TABS: 500.0000 mg | ORAL_TABLET | Freq: Four times a day (QID) | ORAL | Status: DC | PRN

## 2013-06-15 MED ORDER — TRAMADOL HCL 50 MG PO TABS: 100.0000 mg | ORAL_TABLET | Freq: Four times a day (QID) | ORAL | Status: DC | PRN

## 2013-06-15 MED ORDER — ZOLPIDEM TARTRATE 10 MG PO TABS: 10.0000 mg | ORAL_TABLET | Freq: Every evening | ORAL | Status: DC | PRN

## 2013-06-15 NOTE — Discharge Summary (Signed)
Patient ID: [name] MRN: [id number] DOB/AGE: 1945-08-18 68 y.o.  Admit date: 06/12/2013 Discharge date: 06/15/2013  Admission Diagnoses:  Principal Problem:   Arthritis of left hip Active Problems:   Status post THR (total hip replacement)   Discharge Diagnoses:  Same  Past Medical History  Diagnosis Date  . Diverticulosis   . GERD (gastroesophageal reflux disease)   . Esophageal stricture   . Hemorrhoids   . Mallory - Weiss tear     HEALED   . Interstitial cystitis   . ADHD (attention deficit hyperactivity disorder)   . Breast cancer     right side  . Blood transfusion   . Thyroid disease   . Allergy     trees/pollen, mold, fungus, dust mites. Takes allergy shots  . Latex allergy, contact dermatitis   . Cataract   . Depression   . Hernia of abdominal wall     spigelian hernia RLQ - SURGERY TO REPAIR  . H/O hiatal hernia   . Neuromuscular disorder     central nervous system neuropathy- seen per Dr Erling Cruz  . Asthma     allergist Dr Olena Heckle- monthly allergy injections  . Complication of anesthesia     reaction to some anesthetics/ 7/12 anesth record on chart- states prefers epidural  . PONV (postoperative nausea and vomiting)     pt needs scop patch  . Recurrent upper respiratory infection (URI) 1/13- to present    bronchitis following surgery- states improved but still with cough. OV with [facility] Dr [name] 09/06/11 on chart  . Symptomatic bradycardia     a. 12/2012 s/p MDT dual chamber PPM, ser # [id number]; b. 12/2012 post-op course complicated by pericardial effusinon req lead revision.  . Pericardial effusion     a. 12/2012 following ppm placement;  b. 01/01/2013 Echo: EF 55-60%, small pericardial effusion w/o RV collapse-->No need for tap/window.  . Chest pain     a. 12/2012 Cath: nl cors, EF 55-65%.  . Autonomic dysfunction     CENTRAL NERVOUS SYSTEM NEUROPATHY - DX BY DR. Erling Cruz MORE THAN 10 YRS AGO - AND IT IS FELT TO CONTRIBUTE TO THE AUTOMIC   DYSFUNCTION-- PT HAS NUMBNESS LEGS AND FEET AND SOMETIIMES TIPS OF FINGER, SEVERE CONSTIPATION( NO SENSATION TO HAVE BM ), DOUBLE VISION, ORTHOSTATIC HYPOTENSION  . Hypothyroidism   . Bruises easily   . Pacemaker   . Dysrhythmia     HX OF HIGH GRADE HEART BLOCK - REQUIRED PACEMAKER INSERTION  . Shortness of breath     AT TIMES - BUT MUCH IMPROVED AFTER PACEMAKER WAS REPROGRAMED.  . Arthritis     PAIN AND OA LEFT HIP    Surgeries: Procedure(s): LEFT TOTAL HIP ARTHROPLASTY ANTERIOR APPROACH on 06/12/2013   Consultants:    Discharged Condition: Improved  Hospital Course: [name] is an 68 y.o. female who was admitted 06/12/2013 for operative treatment ofArthritis of left hip. Patient has severe unremitting pain that affects sleep, daily activities, and work/hobbies. After pre-op clearance the patient was taken to the operating room on 06/12/2013 and underwent  Procedure(s): LEFT TOTAL HIP ARTHROPLASTY ANTERIOR APPROACH.    Patient was given perioperative antibiotics: Anti-infectives   Start     Dose/Rate Route Frequency Ordered Stop   06/12/13 1400  clindamycin (CLEOCIN) IVPB 600 mg     600 mg 100 mL/hr over 30 Minutes Intravenous Every 6 hours 06/12/13 1122 06/12/13 2022   06/12/13 0612  clindamycin (CLEOCIN) IVPB 900 mg  900 mg 100 mL/hr over 30 Minutes Intravenous On call to O.R. 06/12/13 [id number] 06/12/13 0753       Patient was given sequential compression devices, early ambulation, and chemoprophylaxis to prevent DVT.  Patient benefited maximally from hospital stay and there were no complications.    Recent vital signs: Patient Vitals for the past 24 hrs:  BP Temp Temp src Pulse Resp SpO2  06/15/13 0500 96/59 mmHg 99.3 F (37.4 C) Oral 80 20 96 %  06/15/13 0400 - - - - 20 95 %  06/15/13 0000 - - - - 16 97 %  06/14/13 2105 117/66 mmHg 98.1 F (36.7 C) Oral 86 20 98 %  06/14/13 2000 - - - - 20 100 %  06/14/13 1958 - - - - 20 98 %  06/14/13 1400 92/50 mmHg 98.1  F (36.7 C) Oral 69 18 100 %  06/14/13 1202 - - - 70 - -  06/14/13 1130 90/42 mmHg - - - - -     Recent laboratory studies:  Recent Labs  06/13/13 0457 06/14/13 0753 06/15/13 0445  WBC 9.1 6.8 6.4  HGB 10.6* 9.7* 9.3*  HCT 31.9* 28.2* 27.0*  PLT 149* 135* 137*  NA 138  --   --   K 4.3  --   --   CL 105  --   --   CO2 24  --   --   BUN 13  --   --   CREATININE 0.60  --   --   GLUCOSE 153*  --   --   CALCIUM 8.1*  --   --      Discharge Medications:     Medication List         albuterol 108 (90 BASE) MCG/ACT inhaler  Commonly known as:  PROVENTIL HFA;VENTOLIN HFA  Inhale 2 puffs into the lungs every 6 (six) hours as needed. For shortness of breath     ALIGN 4 MG Caps  Take 4 mg by mouth at bedtime.     ALPHA LIPOIC ACID PO  Take 200 mg by mouth daily.     aspirin 325 MG EC tablet  Take 1 tablet (325 mg total) by mouth 2 (two) times daily after a meal.     atenolol 25 MG tablet  Commonly known as:  TENORMIN  Take 25 mg by mouth every morning.     b complex vitamins capsule  Take 1 capsule by mouth daily.     buPROPion 150 MG 24 hr tablet  Commonly known as:  WELLBUTRIN XL  Take 150 mg by mouth every morning.     cholecalciferol 1000 UNITS tablet  Commonly known as:  VITAMIN D  Take 1,000 Units by mouth daily.     ciclesonide 50 MCG/ACT nasal spray  Commonly known as:  OMNARIS  Place 2 sprays into both nostrils 2 (two) times daily.     clonazePAM 1 MG tablet  Commonly known as:  KLONOPIN  Take 1 mg by mouth at bedtime as needed. For insomnia     cyanocobalamin 2000 MCG tablet  Take 2,000 mcg by mouth daily.     docusate sodium 100 MG capsule  Commonly known as:  COLACE  Take 200 mg by mouth every evening.     DULoxetine 60 MG capsule  Commonly known as:  CYMBALTA  Take 60 mg by mouth every morning.     esomeprazole 40 MG capsule  Commonly known as:  NEXIUM  Take 40 mg  by mouth daily.     estradiol 0.025 MG/24HR  Commonly known as:   VIVELLE-DOT  Place 1 patch onto the skin 2 (two) times a week. Applies new patch on Sunday & Thursday     estradiol 2 MG vaginal ring  Commonly known as:  ESTRING  Place 2 mg vaginally every 3 (three) months. follow package directions     fexofenadine 180 MG tablet  Commonly known as:  ALLEGRA  Take 180 mg by mouth daily.     FIBER SELECT GUMMIES PO  Take 2 tablets by mouth 2 (two) times daily.     fish oil-omega-3 fatty acids 1000 MG capsule  Take 1 g by mouth daily.     ICAPS PO  Take 1 tablet by mouth daily.     lactobacillus acidophilus & bulgar chewable tablet  Chew 1 tablet by mouth every evening.     levothyroxine 112 MCG tablet  Commonly known as:  SYNTHROID, LEVOTHROID  Take 112 mcg by mouth every morning.     magnesium oxide 400 MG tablet  Commonly known as:  MAG-OX  Take 400 mg by mouth daily.     methocarbamol 500 MG tablet  Commonly known as:  ROBAXIN  Take 1 tablet (500 mg total) by mouth every 6 (six) hours as needed for muscle spasms.     montelukast 10 MG tablet  Commonly known as:  SINGULAIR  Take 10 mg by mouth every evening.     oxyCODONE 5 MG immediate release tablet  Commonly known as:  Oxy IR/ROXICODONE  Take 1-2 tablets (5-10 mg total) by mouth every 3 (three) hours as needed for breakthrough pain.     potassium chloride 10 MEQ tablet  Commonly known as:  K-DUR  Take 1 tablet (10 mEq total) by mouth daily.     PRESCRIPTION MEDICATION  ALLERGY SHOT 2 TIMES A WEEK BY DR. Neldon Mc     traMADol 50 MG tablet  Commonly known as:  ULTRAM  Take 2 tablets (100 mg total) by mouth every 6 (six) hours as needed for moderate pain.     vitamin C 500 MG tablet  Commonly known as:  ASCORBIC ACID  Take 500 mg by mouth daily.     zolpidem 10 MG tablet  Commonly known as:  AMBIEN  Take 1 tablet (10 mg total) by mouth at bedtime as needed for sleep.        Diagnostic Studies: Dg Chest 2 View  06/08/2013   CLINICAL DATA:  Preoperative evaluation for  hip replacement, history of asthma  EXAM: CHEST  2 VIEW  COMPARISON:  01/20/2013  FINDINGS: Postsurgical changes are noted in the right breast consistent with the given clinical history. A pacing device is again noted. The cardiac shadow is stable. The lungs show no focal infiltrate. Mild apical scarring is again seen. No bony abnormality is noted.  IMPRESSION: No acute abnormality seen.   Electronically Signed   By: Inez Catalina M.D.   On: 06/08/2013 16:23   Dg Pelvis Portable  06/12/2013   CLINICAL DATA:  Left hip replacement.  EXAM: PORTABLE PELVIS 1-2 VIEWS  COMPARISON:  11/17/2010  FINDINGS: Changes of interval left hip replacement. No hardware or bony complicating feature. Normal AP alignment. Remote changes of right hip replacement. No acute bony findings.  IMPRESSION: Left hip replacement without complicating feature.   Electronically Signed   By: Rolm Baptise M.D.   On: 06/12/2013 13:12   Dg Hip Portable 1 View Left  06/12/2013   CLINICAL DATA:  Postop left hip replacement.  EXAM: PORTABLE LEFT HIP - 1 VIEW  COMPARISON:  None.  FINDINGS: Changes of left hip replacement. Normal alignment. No hardware or bony complicating features.  IMPRESSION: Left hip replacement.  No complicating feature.   Electronically Signed   By: Rolm Baptise M.D.   On: 06/12/2013 13:12   Dg C-arm 1-60 Min-no Report  06/12/2013   CLINICAL DATA: intraop ant hip   C-ARM 1-60 MINUTES  Fluoroscopy was utilized by the requesting physician.  No radiographic  interpretation.     Disposition:   to home      Discharge Orders   Future Appointments Provider Department Dept Phone   07/08/2013 8:15 AM [facility] [place] [facility] [phone]   Future Orders Complete By Expires   Call MD / Call 911  As directed    Comments:     If you experience chest pain or shortness of breath, CALL 911 and be transported to the hospital emergency room.  If you develope a fever above 101 F, pus (white drainage)  or increased drainage or redness at the wound, or calf pain, call your surgeon's office.   Constipation Prevention  As directed    Comments:     Drink plenty of fluids.  Prune juice may be helpful.  You may use a stool softener, such as Colace (over the counter) 100 mg twice a day.  Use MiraLax (over the counter) for constipation as needed.   Diet - low sodium heart healthy  As directed    Discharge instructions  As directed    Comments:     Increase activities as comfort allows. Full weight left hip. Ice or heat as needed. You can get your current dressing wet in the shower. You can remove your dressing this coming Friday and start getting your actual incision wet; leave the steri strips in place if possible   Discharge patient  As directed    Increase activity slowly as tolerated  As directed    Weight bearing as tolerated  As directed    Questions:     Laterality:     Extremity:        Follow-up Information   Follow up with Mcarthur Rossetti, MD In 2 weeks.   Specialty:  Orthopedic Surgery   Contact information:   [place] [address] [phone]        Signed: Mcarthur Rossetti 06/15/2013, 7:41 AM

## 2013-06-15 NOTE — Care Management Note (Signed)
    Page 1 of 1   06/15/2013     12:06:16 PM   CARE MANAGEMENT NOTE 06/15/2013  Patient:  [name], [name]   Account Number:  [id number]  Date Initiated:  06/15/2013  Documentation initiated by:  [facility]  Subjective/Objective Assessment:   Status post THR (total hip replacement)     Action/Plan:   Anticipated DC Date:  06/15/2013   Anticipated DC Plan:  [place]  CM consult      [facility] Choice  HOME HEALTH   Choice offered to / List presented to:  C-1 Patient        [place] arranged  HH-2 PT      [place]   Status of service:  In process, will continue to follow Medicare Important Message given?   (If response is "NO", the following Medicare IM given date fields will be blank) Date Medicare IM given:   Date Additional Medicare IM given:    Discharge Disposition:  [place]  Per UR Regulation:    If discussed at Long Length of Stay Meetings, dates discussed:    Comments:  06/15/2013 Sherrin Daisy BSN RN CCM [phone] CM spoke with patient & spouse. Plans are for patient to return to her home in [place] where spouse will be caregiver. She already has equipment because had joint replacemnt several years ago. She palns to use [facility] for [facility] services. Plans are for discharge today. Has contact for [facility] agency.  06/15/2013 1002 Pt preoperatively set up with [facility] for [facility]. Waiting final recommedations for home. Jonnie Finner RN CCM Case Mgmt phone [phone]

## 2013-06-15 NOTE — Progress Notes (Signed)
Patient ID: [name], female   DOB: 06-Feb-1946, 68 y.o.   MRN: [id number] Looks good overall.  A little hypotensive with acute blood loss anemia, but tolerating well.  Can discharge to home today.

## 2013-06-15 NOTE — Progress Notes (Signed)
Physical Therapy Treatment and D/C from Acute PT Patient Details Name: [name] MRN: [id number] DOB: 06/01/1945 Today's Date: 06/15/2013 Time: 0837-0857 PT Time Calculation (min): 20 min  PT Assessment / Plan / Recommendation  History of Present Illness 67 yo female s/p LT THR direct anterior   PT Comments   Pt moving very well and reports she is very familiar with all exercises.  Pt has met all goals and to be d/c home today.  Pt had no further questions/concerns.   Follow Up Recommendations  Home health PT     Does the patient have the potential to tolerate intense rehabilitation     Barriers to Discharge        Equipment Recommendations  None recommended by PT    Recommendations for Other Services    Frequency     Progress towards PT Goals Progress towards PT goals: Goals met/education completed, patient discharged from PT  Plan Current plan remains appropriate    Precautions / Restrictions Precautions Precautions: None Restrictions Other Position/Activity Restrictions: WBAT   Pertinent Vitals/Pain C/o soreness, trying not to take any pain meds    Mobility  Bed Mobility Overal bed mobility: Modified Independent Transfers Overall transfer level: Modified independent Equipment used: Rolling walker (2 wheeled) Transfers: Sit to/from Stand Sit to Stand: Modified independent (Device/Increase time) Ambulation/Gait Ambulation/Gait assistance: Modified independent (Device/Increase time) Ambulation Distance (Feet): 400 Feet Assistive device: Rolling walker (2 wheeled) Gait Pattern/deviations: Antalgic;Step-through pattern Gait velocity: good pace Stairs: Yes Stairs assistance: Supervision Stair Management: One rail Right;Step to pattern;Forwards Number of Stairs: 4 General stair comments: pt reports spouse will assist her into home, used rail on R and provided HHA however pt supervision level and able to perform correctly and safely    Exercises Total  Joint Exercises Ankle Circles/Pumps: AROM;15 reps;Supine;Both Quad Sets: AROM;Both;10 reps;Supine Gluteal Sets: Both;10 reps;Supine Towel Squeeze: AROM;Both;10 reps;Supine Heel Slides: 15 reps;Supine;Left;AROM Hip ABduction/ADduction: Left;15 reps;Supine;AROM Straight Leg Raises: AROM;Left;10 reps;Supine Long Arc Quad: 10 reps;AROM;Left;Seated Marching in Standing: AROM;Seated;Left;10 reps   PT Diagnosis:    PT Problem List:   PT Treatment Interventions:     PT Goals (current goals can now be found in the care plan section)    Visit Information  Last PT Received On: 06/15/13 Assistance Needed: +1 History of Present Illness: 67 yo female s/p LT THR direct anterior    Subjective Data      Cognition  Cognition Arousal/Alertness: Awake/alert Behavior During Therapy: WFL for tasks assessed/performed Overall Cognitive Status: Within Functional Limits for tasks assessed    Balance     End of Session PT - End of Session Activity Tolerance: Patient tolerated treatment well Patient left: in bed;with call bell/phone within reach;with nursing/sitter in room   GP     LEMYRE,KATHrine E 06/15/2013, 10:04 AM Kati Lemyre, PT, DPT 06/15/2013 Pager: [phone]   

## 2013-06-16 DIAGNOSIS — R269 Unspecified abnormalities of gait and mobility: Secondary | ICD-10-CM | POA: Diagnosis not present

## 2013-06-16 DIAGNOSIS — Z471 Aftercare following joint replacement surgery: Secondary | ICD-10-CM | POA: Diagnosis not present

## 2013-06-16 DIAGNOSIS — IMO0001 Reserved for inherently not codable concepts without codable children: Secondary | ICD-10-CM | POA: Diagnosis not present

## 2013-06-16 DIAGNOSIS — H532 Diplopia: Secondary | ICD-10-CM | POA: Diagnosis not present

## 2013-06-16 DIAGNOSIS — I951 Orthostatic hypotension: Secondary | ICD-10-CM | POA: Diagnosis not present

## 2013-06-16 DIAGNOSIS — Z96649 Presence of unspecified artificial hip joint: Secondary | ICD-10-CM | POA: Diagnosis not present

## 2013-06-19 DIAGNOSIS — Z471 Aftercare following joint replacement surgery: Secondary | ICD-10-CM | POA: Diagnosis not present

## 2013-06-19 DIAGNOSIS — I951 Orthostatic hypotension: Secondary | ICD-10-CM | POA: Diagnosis not present

## 2013-06-19 DIAGNOSIS — R269 Unspecified abnormalities of gait and mobility: Secondary | ICD-10-CM | POA: Diagnosis not present

## 2013-06-19 DIAGNOSIS — IMO0001 Reserved for inherently not codable concepts without codable children: Secondary | ICD-10-CM | POA: Diagnosis not present

## 2013-06-19 DIAGNOSIS — Z96649 Presence of unspecified artificial hip joint: Secondary | ICD-10-CM | POA: Diagnosis not present

## 2013-06-19 DIAGNOSIS — H532 Diplopia: Secondary | ICD-10-CM | POA: Diagnosis not present

## 2013-06-22 DIAGNOSIS — IMO0001 Reserved for inherently not codable concepts without codable children: Secondary | ICD-10-CM | POA: Diagnosis not present

## 2013-06-22 DIAGNOSIS — R269 Unspecified abnormalities of gait and mobility: Secondary | ICD-10-CM | POA: Diagnosis not present

## 2013-06-22 DIAGNOSIS — Z96649 Presence of unspecified artificial hip joint: Secondary | ICD-10-CM | POA: Diagnosis not present

## 2013-06-22 DIAGNOSIS — Z471 Aftercare following joint replacement surgery: Secondary | ICD-10-CM | POA: Diagnosis not present

## 2013-06-22 DIAGNOSIS — H532 Diplopia: Secondary | ICD-10-CM | POA: Diagnosis not present

## 2013-06-22 DIAGNOSIS — I951 Orthostatic hypotension: Secondary | ICD-10-CM | POA: Diagnosis not present

## 2013-06-24 DIAGNOSIS — Z471 Aftercare following joint replacement surgery: Secondary | ICD-10-CM | POA: Diagnosis not present

## 2013-06-24 DIAGNOSIS — IMO0001 Reserved for inherently not codable concepts without codable children: Secondary | ICD-10-CM | POA: Diagnosis not present

## 2013-06-24 DIAGNOSIS — I951 Orthostatic hypotension: Secondary | ICD-10-CM | POA: Diagnosis not present

## 2013-06-24 DIAGNOSIS — Z96649 Presence of unspecified artificial hip joint: Secondary | ICD-10-CM | POA: Diagnosis not present

## 2013-06-24 DIAGNOSIS — H532 Diplopia: Secondary | ICD-10-CM | POA: Diagnosis not present

## 2013-06-24 DIAGNOSIS — R269 Unspecified abnormalities of gait and mobility: Secondary | ICD-10-CM | POA: Diagnosis not present

## 2013-06-26 DIAGNOSIS — IMO0001 Reserved for inherently not codable concepts without codable children: Secondary | ICD-10-CM | POA: Diagnosis not present

## 2013-06-26 DIAGNOSIS — Z96649 Presence of unspecified artificial hip joint: Secondary | ICD-10-CM | POA: Diagnosis not present

## 2013-06-26 DIAGNOSIS — Z471 Aftercare following joint replacement surgery: Secondary | ICD-10-CM | POA: Diagnosis not present

## 2013-06-26 DIAGNOSIS — H532 Diplopia: Secondary | ICD-10-CM | POA: Diagnosis not present

## 2013-06-26 DIAGNOSIS — R269 Unspecified abnormalities of gait and mobility: Secondary | ICD-10-CM | POA: Diagnosis not present

## 2013-06-26 DIAGNOSIS — I951 Orthostatic hypotension: Secondary | ICD-10-CM | POA: Diagnosis not present

## 2013-07-02 DIAGNOSIS — M25569 Pain in unspecified knee: Secondary | ICD-10-CM | POA: Diagnosis not present

## 2013-07-08 ENCOUNTER — Ambulatory Visit (INDEPENDENT_AMBULATORY_CARE_PROVIDER_SITE_OTHER): Payer: Medicare Other | Admitting: *Deleted

## 2013-07-08 DIAGNOSIS — M545 Low back pain, unspecified: Secondary | ICD-10-CM | POA: Diagnosis not present

## 2013-07-08 DIAGNOSIS — R000 Tachycardia, unspecified: Secondary | ICD-10-CM

## 2013-07-08 DIAGNOSIS — I441 Atrioventricular block, second degree: Secondary | ICD-10-CM

## 2013-07-08 DIAGNOSIS — I498 Other specified cardiac arrhythmias: Secondary | ICD-10-CM

## 2013-07-08 DIAGNOSIS — M25569 Pain in unspecified knee: Secondary | ICD-10-CM | POA: Diagnosis not present

## 2013-07-15 DIAGNOSIS — M25569 Pain in unspecified knee: Secondary | ICD-10-CM | POA: Diagnosis not present

## 2013-07-17 DIAGNOSIS — R198 Other specified symptoms and signs involving the digestive system and abdomen: Secondary | ICD-10-CM | POA: Diagnosis not present

## 2013-07-17 DIAGNOSIS — IMO0002 Reserved for concepts with insufficient information to code with codable children: Secondary | ICD-10-CM | POA: Diagnosis not present

## 2013-07-17 DIAGNOSIS — Z96649 Presence of unspecified artificial hip joint: Secondary | ICD-10-CM | POA: Diagnosis not present

## 2013-07-17 DIAGNOSIS — K5900 Constipation, unspecified: Secondary | ICD-10-CM | POA: Diagnosis not present

## 2013-07-17 DIAGNOSIS — R0602 Shortness of breath: Secondary | ICD-10-CM | POA: Diagnosis not present

## 2013-07-17 DIAGNOSIS — M25569 Pain in unspecified knee: Secondary | ICD-10-CM | POA: Diagnosis not present

## 2013-07-17 LAB — MDC_IDC_ENUM_SESS_TYPE_REMOTE
Battery Remaining Longevity: 114 mo
Battery Voltage: 3.03 V
Brady Statistic AP VS Percent: 0.03 %
Brady Statistic AS VS Percent: 0.04 %
Brady Statistic RA Percent Paced: 9.14 %
Brady Statistic RV Percent Paced: 99.93 %
Lead Channel Impedance Value: 323 Ohm
Lead Channel Impedance Value: 475 Ohm
Lead Channel Pacing Threshold Amplitude: 0.5 V
Lead Channel Pacing Threshold Amplitude: 1.25 V
Lead Channel Pacing Threshold Pulse Width: 0.4 ms
Lead Channel Pacing Threshold Pulse Width: 0.4 ms
Lead Channel Sensing Intrinsic Amplitude: 14.875 mV
Lead Channel Sensing Intrinsic Amplitude: 14.875 mV
Lead Channel Sensing Intrinsic Amplitude: 2.125 mV
Lead Channel Setting Pacing Amplitude: 2 V
Lead Channel Setting Pacing Amplitude: 2.5 V
Lead Channel Setting Pacing Pulse Width: 0.4 ms
Lead Channel Setting Sensing Sensitivity: 0.9 mV
MDC IDC MSMT LEADCHNL RA SENSING INTR AMPL: 2.125 mV
MDC IDC MSMT LEADCHNL RV IMPEDANCE VALUE: 608 Ohm
MDC IDC MSMT LEADCHNL RV IMPEDANCE VALUE: 665 Ohm
MDC IDC SESS DTM: 20150228194629
MDC IDC SET ZONE DETECTION INTERVAL: 400 ms
MDC IDC STAT BRADY AP VP PERCENT: 9.11 %
MDC IDC STAT BRADY AS VP PERCENT: 90.82 %
Zone Setting Detection Interval: 400 ms

## 2013-07-21 DIAGNOSIS — M25569 Pain in unspecified knee: Secondary | ICD-10-CM | POA: Diagnosis not present

## 2013-07-23 DIAGNOSIS — M545 Low back pain, unspecified: Secondary | ICD-10-CM | POA: Diagnosis not present

## 2013-07-23 DIAGNOSIS — M25569 Pain in unspecified knee: Secondary | ICD-10-CM | POA: Diagnosis not present

## 2013-07-27 DIAGNOSIS — J309 Allergic rhinitis, unspecified: Secondary | ICD-10-CM | POA: Diagnosis not present

## 2013-07-28 DIAGNOSIS — M25569 Pain in unspecified knee: Secondary | ICD-10-CM | POA: Diagnosis not present

## 2013-07-29 DIAGNOSIS — Z79899 Other long term (current) drug therapy: Secondary | ICD-10-CM | POA: Diagnosis not present

## 2013-07-29 DIAGNOSIS — I4891 Unspecified atrial fibrillation: Secondary | ICD-10-CM | POA: Diagnosis not present

## 2013-07-29 DIAGNOSIS — Z0000 Encounter for general adult medical examination without abnormal findings: Secondary | ICD-10-CM | POA: Diagnosis not present

## 2013-07-30 DIAGNOSIS — M25569 Pain in unspecified knee: Secondary | ICD-10-CM | POA: Diagnosis not present

## 2013-08-03 ENCOUNTER — Encounter: Payer: Self-pay | Admitting: *Deleted

## 2013-08-03 DIAGNOSIS — M25569 Pain in unspecified knee: Secondary | ICD-10-CM | POA: Diagnosis not present

## 2013-08-05 DIAGNOSIS — Z0000 Encounter for general adult medical examination without abnormal findings: Secondary | ICD-10-CM | POA: Diagnosis not present

## 2013-08-05 DIAGNOSIS — Z950 Presence of cardiac pacemaker: Secondary | ICD-10-CM | POA: Diagnosis not present

## 2013-08-05 DIAGNOSIS — K219 Gastro-esophageal reflux disease without esophagitis: Secondary | ICD-10-CM | POA: Diagnosis not present

## 2013-08-05 DIAGNOSIS — IMO0002 Reserved for concepts with insufficient information to code with codable children: Secondary | ICD-10-CM | POA: Diagnosis not present

## 2013-08-05 DIAGNOSIS — F329 Major depressive disorder, single episode, unspecified: Secondary | ICD-10-CM | POA: Diagnosis not present

## 2013-08-05 DIAGNOSIS — J45909 Unspecified asthma, uncomplicated: Secondary | ICD-10-CM | POA: Diagnosis not present

## 2013-08-05 DIAGNOSIS — I4891 Unspecified atrial fibrillation: Secondary | ICD-10-CM | POA: Diagnosis not present

## 2013-08-06 DIAGNOSIS — Z1212 Encounter for screening for malignant neoplasm of rectum: Secondary | ICD-10-CM | POA: Diagnosis not present

## 2013-08-06 DIAGNOSIS — M25569 Pain in unspecified knee: Secondary | ICD-10-CM | POA: Diagnosis not present

## 2013-08-11 ENCOUNTER — Encounter: Payer: Self-pay | Admitting: Internal Medicine

## 2013-08-11 ENCOUNTER — Other Ambulatory Visit: Payer: Self-pay | Admitting: Internal Medicine

## 2013-08-13 DIAGNOSIS — M25569 Pain in unspecified knee: Secondary | ICD-10-CM | POA: Diagnosis not present

## 2013-08-18 DIAGNOSIS — M25569 Pain in unspecified knee: Secondary | ICD-10-CM | POA: Diagnosis not present

## 2013-08-19 DIAGNOSIS — M949 Disorder of cartilage, unspecified: Secondary | ICD-10-CM | POA: Diagnosis not present

## 2013-08-19 DIAGNOSIS — M899 Disorder of bone, unspecified: Secondary | ICD-10-CM | POA: Diagnosis not present

## 2013-08-19 DIAGNOSIS — Z1231 Encounter for screening mammogram for malignant neoplasm of breast: Secondary | ICD-10-CM | POA: Diagnosis not present

## 2013-08-19 DIAGNOSIS — J309 Allergic rhinitis, unspecified: Secondary | ICD-10-CM | POA: Diagnosis not present

## 2013-08-19 DIAGNOSIS — Z853 Personal history of malignant neoplasm of breast: Secondary | ICD-10-CM | POA: Diagnosis not present

## 2013-08-20 DIAGNOSIS — M25569 Pain in unspecified knee: Secondary | ICD-10-CM | POA: Diagnosis not present

## 2013-08-25 DIAGNOSIS — M76899 Other specified enthesopathies of unspecified lower limb, excluding foot: Secondary | ICD-10-CM | POA: Diagnosis not present

## 2013-08-25 DIAGNOSIS — M169 Osteoarthritis of hip, unspecified: Secondary | ICD-10-CM | POA: Diagnosis not present

## 2013-08-25 DIAGNOSIS — M1610 Unilateral primary osteoarthritis, unspecified hip: Secondary | ICD-10-CM | POA: Diagnosis not present

## 2013-08-25 DIAGNOSIS — M25569 Pain in unspecified knee: Secondary | ICD-10-CM | POA: Diagnosis not present

## 2013-08-27 DIAGNOSIS — J309 Allergic rhinitis, unspecified: Secondary | ICD-10-CM | POA: Diagnosis not present

## 2013-08-27 DIAGNOSIS — M25569 Pain in unspecified knee: Secondary | ICD-10-CM | POA: Diagnosis not present

## 2013-09-01 DIAGNOSIS — M25569 Pain in unspecified knee: Secondary | ICD-10-CM | POA: Diagnosis not present

## 2013-09-03 DIAGNOSIS — J309 Allergic rhinitis, unspecified: Secondary | ICD-10-CM | POA: Diagnosis not present

## 2013-09-03 DIAGNOSIS — M25569 Pain in unspecified knee: Secondary | ICD-10-CM | POA: Diagnosis not present

## 2013-09-08 DIAGNOSIS — M25569 Pain in unspecified knee: Secondary | ICD-10-CM | POA: Diagnosis not present

## 2013-09-11 DIAGNOSIS — K219 Gastro-esophageal reflux disease without esophagitis: Secondary | ICD-10-CM | POA: Diagnosis not present

## 2013-09-11 DIAGNOSIS — K5900 Constipation, unspecified: Secondary | ICD-10-CM | POA: Diagnosis not present

## 2013-09-21 DIAGNOSIS — M25569 Pain in unspecified knee: Secondary | ICD-10-CM | POA: Diagnosis not present

## 2013-09-21 DIAGNOSIS — M76899 Other specified enthesopathies of unspecified lower limb, excluding foot: Secondary | ICD-10-CM | POA: Diagnosis not present

## 2013-09-21 DIAGNOSIS — M169 Osteoarthritis of hip, unspecified: Secondary | ICD-10-CM | POA: Diagnosis not present

## 2013-09-21 DIAGNOSIS — J309 Allergic rhinitis, unspecified: Secondary | ICD-10-CM | POA: Diagnosis not present

## 2013-09-21 DIAGNOSIS — M1610 Unilateral primary osteoarthritis, unspecified hip: Secondary | ICD-10-CM | POA: Diagnosis not present

## 2013-09-24 DIAGNOSIS — M25569 Pain in unspecified knee: Secondary | ICD-10-CM | POA: Diagnosis not present

## 2013-09-29 DIAGNOSIS — M25569 Pain in unspecified knee: Secondary | ICD-10-CM | POA: Diagnosis not present

## 2013-10-01 DIAGNOSIS — M545 Low back pain, unspecified: Secondary | ICD-10-CM | POA: Diagnosis not present

## 2013-10-01 DIAGNOSIS — M25569 Pain in unspecified knee: Secondary | ICD-10-CM | POA: Diagnosis not present

## 2013-10-07 DIAGNOSIS — J309 Allergic rhinitis, unspecified: Secondary | ICD-10-CM | POA: Diagnosis not present

## 2013-10-07 DIAGNOSIS — M25569 Pain in unspecified knee: Secondary | ICD-10-CM | POA: Diagnosis not present

## 2013-10-09 DIAGNOSIS — M25569 Pain in unspecified knee: Secondary | ICD-10-CM | POA: Diagnosis not present

## 2013-10-12 ENCOUNTER — Ambulatory Visit (INDEPENDENT_AMBULATORY_CARE_PROVIDER_SITE_OTHER): Payer: Medicare Other | Admitting: *Deleted

## 2013-10-12 DIAGNOSIS — I498 Other specified cardiac arrhythmias: Secondary | ICD-10-CM

## 2013-10-12 DIAGNOSIS — R000 Tachycardia, unspecified: Secondary | ICD-10-CM

## 2013-10-12 LAB — MDC_IDC_ENUM_SESS_TYPE_REMOTE
Battery Remaining Longevity: 106 mo
Battery Voltage: 3.02 V
Brady Statistic AP VP Percent: 5.34 %
Brady Statistic RA Percent Paced: 5.34 %
Brady Statistic RV Percent Paced: 99.98 %
Date Time Interrogation Session: 20150601140142
Lead Channel Impedance Value: 646 Ohm
Lead Channel Impedance Value: 722 Ohm
Lead Channel Pacing Threshold Amplitude: 1.375 V
Lead Channel Pacing Threshold Pulse Width: 0.4 ms
Lead Channel Pacing Threshold Pulse Width: 0.4 ms
Lead Channel Sensing Intrinsic Amplitude: 16.25 mV
Lead Channel Sensing Intrinsic Amplitude: 2.625 mV
Lead Channel Sensing Intrinsic Amplitude: 2.625 mV
Lead Channel Setting Pacing Amplitude: 2 V
MDC IDC MSMT LEADCHNL RA IMPEDANCE VALUE: 323 Ohm
MDC IDC MSMT LEADCHNL RA IMPEDANCE VALUE: 475 Ohm
MDC IDC MSMT LEADCHNL RV PACING THRESHOLD AMPLITUDE: 0.625 V
MDC IDC MSMT LEADCHNL RV SENSING INTR AMPL: 16.25 mV
MDC IDC SET LEADCHNL RA PACING AMPLITUDE: 2.75 V
MDC IDC SET LEADCHNL RV PACING PULSEWIDTH: 0.4 ms
MDC IDC SET LEADCHNL RV SENSING SENSITIVITY: 0.9 mV
MDC IDC SET ZONE DETECTION INTERVAL: 400 ms
MDC IDC STAT BRADY AP VS PERCENT: 0 %
MDC IDC STAT BRADY AS VP PERCENT: 94.64 %
MDC IDC STAT BRADY AS VS PERCENT: 0.02 %
Zone Setting Detection Interval: 400 ms

## 2013-10-12 NOTE — Progress Notes (Signed)
Remote pacemaker transmission.   

## 2013-10-13 DIAGNOSIS — M25569 Pain in unspecified knee: Secondary | ICD-10-CM | POA: Diagnosis not present

## 2013-10-15 DIAGNOSIS — M25569 Pain in unspecified knee: Secondary | ICD-10-CM | POA: Diagnosis not present

## 2013-10-27 DIAGNOSIS — M25569 Pain in unspecified knee: Secondary | ICD-10-CM | POA: Diagnosis not present

## 2013-10-29 DIAGNOSIS — M25569 Pain in unspecified knee: Secondary | ICD-10-CM | POA: Diagnosis not present

## 2013-10-29 DIAGNOSIS — J309 Allergic rhinitis, unspecified: Secondary | ICD-10-CM | POA: Diagnosis not present

## 2013-11-03 DIAGNOSIS — M25569 Pain in unspecified knee: Secondary | ICD-10-CM | POA: Diagnosis not present

## 2013-11-03 DIAGNOSIS — J309 Allergic rhinitis, unspecified: Secondary | ICD-10-CM | POA: Diagnosis not present

## 2013-11-05 DIAGNOSIS — M25569 Pain in unspecified knee: Secondary | ICD-10-CM | POA: Diagnosis not present

## 2013-11-09 DIAGNOSIS — M25569 Pain in unspecified knee: Secondary | ICD-10-CM | POA: Diagnosis not present

## 2013-11-10 ENCOUNTER — Encounter: Payer: Self-pay | Admitting: Cardiology

## 2013-11-10 DIAGNOSIS — M25569 Pain in unspecified knee: Secondary | ICD-10-CM | POA: Diagnosis not present

## 2013-11-11 ENCOUNTER — Encounter: Payer: Self-pay | Admitting: Cardiology

## 2013-11-17 DIAGNOSIS — M25569 Pain in unspecified knee: Secondary | ICD-10-CM | POA: Diagnosis not present

## 2013-11-19 DIAGNOSIS — M25569 Pain in unspecified knee: Secondary | ICD-10-CM | POA: Diagnosis not present

## 2013-11-24 DIAGNOSIS — M25569 Pain in unspecified knee: Secondary | ICD-10-CM | POA: Diagnosis not present

## 2013-11-24 DIAGNOSIS — J309 Allergic rhinitis, unspecified: Secondary | ICD-10-CM | POA: Diagnosis not present

## 2013-11-26 DIAGNOSIS — M25569 Pain in unspecified knee: Secondary | ICD-10-CM | POA: Diagnosis not present

## 2013-12-01 DIAGNOSIS — M25569 Pain in unspecified knee: Secondary | ICD-10-CM | POA: Diagnosis not present

## 2013-12-02 ENCOUNTER — Encounter: Payer: Self-pay | Admitting: Internal Medicine

## 2013-12-03 DIAGNOSIS — M25569 Pain in unspecified knee: Secondary | ICD-10-CM | POA: Diagnosis not present

## 2013-12-08 DIAGNOSIS — M25569 Pain in unspecified knee: Secondary | ICD-10-CM | POA: Diagnosis not present

## 2013-12-10 DIAGNOSIS — M25569 Pain in unspecified knee: Secondary | ICD-10-CM | POA: Diagnosis not present

## 2013-12-18 DIAGNOSIS — J309 Allergic rhinitis, unspecified: Secondary | ICD-10-CM | POA: Diagnosis not present

## 2013-12-24 ENCOUNTER — Other Ambulatory Visit: Payer: Self-pay | Admitting: Internal Medicine

## 2014-01-04 ENCOUNTER — Other Ambulatory Visit: Payer: Self-pay | Admitting: Internal Medicine

## 2014-01-07 ENCOUNTER — Ambulatory Visit (INDEPENDENT_AMBULATORY_CARE_PROVIDER_SITE_OTHER): Payer: Medicare Other | Admitting: Internal Medicine

## 2014-01-07 ENCOUNTER — Encounter: Payer: Self-pay | Admitting: Internal Medicine

## 2014-01-07 VITALS — BP 114/79 | HR 79 | Ht 67.0 in | Wt 149.6 lb

## 2014-01-07 DIAGNOSIS — I441 Atrioventricular block, second degree: Secondary | ICD-10-CM

## 2014-01-07 DIAGNOSIS — I951 Orthostatic hypotension: Secondary | ICD-10-CM | POA: Diagnosis not present

## 2014-01-07 LAB — MDC_IDC_ENUM_SESS_TYPE_INCLINIC
Battery Remaining Longevity: 102 mo
Battery Voltage: 3.01 V
Brady Statistic AP VP Percent: 5.65 %
Brady Statistic AP VS Percent: 0.01 %
Brady Statistic AS VP Percent: 94.31 %
Brady Statistic RV Percent Paced: 99.96 %
Lead Channel Impedance Value: 323 Ohm
Lead Channel Impedance Value: 475 Ohm
Lead Channel Impedance Value: 684 Ohm
Lead Channel Impedance Value: 760 Ohm
Lead Channel Pacing Threshold Amplitude: 1 V
Lead Channel Pacing Threshold Pulse Width: 0.4 ms
Lead Channel Pacing Threshold Pulse Width: 0.4 ms
Lead Channel Sensing Intrinsic Amplitude: 16.25 mV
Lead Channel Sensing Intrinsic Amplitude: 7 mV
Lead Channel Setting Pacing Amplitude: 2 V
Lead Channel Setting Pacing Amplitude: 2.5 V
Lead Channel Setting Pacing Pulse Width: 0.4 ms
Lead Channel Setting Sensing Sensitivity: 0.9 mV
MDC IDC MSMT LEADCHNL RA PACING THRESHOLD AMPLITUDE: 1.5 V
MDC IDC MSMT LEADCHNL RA PACING THRESHOLD PULSEWIDTH: 0.6 ms
MDC IDC MSMT LEADCHNL RA SENSING INTR AMPL: 1.5 mV
MDC IDC MSMT LEADCHNL RA SENSING INTR AMPL: 2.75 mV
MDC IDC MSMT LEADCHNL RV PACING THRESHOLD AMPLITUDE: 0.625 V
MDC IDC SESS DTM: 20150827164210
MDC IDC SET ZONE DETECTION INTERVAL: 400 ms
MDC IDC STAT BRADY AS VS PERCENT: 0.03 %
MDC IDC STAT BRADY RA PERCENT PACED: 5.66 %
Zone Setting Detection Interval: 400 ms

## 2014-01-07 NOTE — Progress Notes (Signed)
Patient Care Team: Geoffery Lyons, MD as PCP - General (Internal Medicine)   HPI  [name] is a 68 y.o. female Seen in followup for high grade heart block for which she underwent pacing compllicated by microperforation modest effusion and then adderall withdrawal   She is far  better following reprogramming of the device after we noted upper rate behavior limiting heart rate excursion she still has complaints sometimes and exercise intolerance on stairs  Was continued to be an issue. sHe underwent hip replacement surgery and and physical therapy has been slow and modest because of problems with an elevated heart rate. sHe has a blood pressure machine at home which he brings in today and demonstrates multiple blood pressures in the 85-95 range with heart rates in the 90s. She says this occurs at the top of the stairs and is associated with a pounding sensation.  She reminded that she has a diagnosis of central polyneuropathy.  In 12/14 she underwent stress testing and was found to have upper rate behaviour at 120 which we reprogrammed around and this was incompletely helpful     Past Medical History  Diagnosis Date  . Diverticulosis   . GERD (gastroesophageal reflux disease)   . Esophageal stricture   . Hemorrhoids   . Mallory - Weiss tear     HEALED   . Interstitial cystitis   . ADHD (attention deficit hyperactivity disorder)   . Breast cancer     right side  . Blood transfusion   . Thyroid disease   . Allergy     trees/pollen, mold, fungus, dust mites. Takes allergy shots  . Latex allergy, contact dermatitis   . Cataract   . Depression   . Hernia of abdominal wall     spigelian hernia RLQ - SURGERY TO REPAIR  . H/O hiatal hernia   . Neuromuscular disorder     central nervous system neuropathy- seen per Dr Erling Cruz  . Asthma     allergist Dr Olena Heckle- monthly allergy injections  . Complication of anesthesia     reaction to some anesthetics/ 7/12 anesth  record on chart- states prefers epidural  . PONV (postoperative nausea and vomiting)     pt needs scop patch  . Recurrent upper respiratory infection (URI) 1/13- to present    bronchitis following surgery- states improved but still with cough. OV with [facility] Dr [name] 09/06/11 on chart  . Symptomatic bradycardia     a. 12/2012 s/p MDT dual chamber PPM, ser # [id number]; b. 12/2012 post-op course complicated by pericardial effusinon req lead revision.  . Pericardial effusion     a. 12/2012 following ppm placement;  b. 01/01/2013 Echo: EF 55-60%, small pericardial effusion w/o RV collapse-->No need for tap/window.  . Chest pain     a. 12/2012 Cath: nl cors, EF 55-65%.  . Autonomic dysfunction     CENTRAL NERVOUS SYSTEM NEUROPATHY - DX BY DR. Erling Cruz MORE THAN 10 YRS AGO - AND IT IS FELT TO CONTRIBUTE TO THE AUTOMIC  DYSFUNCTION-- PT HAS NUMBNESS LEGS AND FEET AND SOMETIIMES TIPS OF FINGER, SEVERE CONSTIPATION( NO SENSATION TO HAVE BM ), DOUBLE VISION, ORTHOSTATIC HYPOTENSION  . Hypothyroidism   . Bruises easily   . Pacemaker   . Dysrhythmia     HX OF HIGH GRADE HEART BLOCK - REQUIRED PACEMAKER INSERTION  . Shortness of breath     AT TIMES - BUT MUCH IMPROVED AFTER PACEMAKER WAS REPROGRAMED.  . Arthritis  PAIN AND OA LEFT HIP    Past Surgical History  Procedure Laterality Date  . Mastectomy modified radical      right; with immediate reconstruction  . Laparoscopy  1973  . Abdominal hysterectomy  1974  . Cystoscopy  1975, 2007  . Myringoplasty  1962  . Tympanoplasty  1973    right  . Oophorectomy  1982  . Bladder suspension    . Rectocele repair      with cystocele repair  . Total hip arthroplasty Right   . Ventral hernia repair  05/30/2011    Procedure: HERNIA REPAIR VENTRAL ADULT;  Surgeon: Haywood Lasso, MD;  Location: [facility];  Service: General;  Laterality: Right;  repair right spigelian hernia  . Breast biopsy  2002    NO BLOOD PRESSURES ON RIGHT SIDE/    s/p  axillary node dissection  . Tonsillectomy    . Back surgery      cervical fusion 4-5 with plate  . Cysto with hydrodistension  09/07/2011    Procedure: CYSTOSCOPY/HYDRODISTENSION;  Surgeon: Ailene Rud, MD;  Location: [facility];  Service: Urology;  Laterality: N/A;  INSTILLATION OF MARCAINE/PYRIDIUM INSTILLATION OF MARCAINE/KENALOG  . Pacemaker insertion    . Eye surgery      LASIK EYE SURGERY BILATERAL  . Ulnar nerve transposition Right   . Total hip arthroplasty Left 06/12/2013    Procedure: LEFT TOTAL HIP ARTHROPLASTY ANTERIOR APPROACH;  Surgeon: Mcarthur Rossetti, MD;  Location: [facility];  Service: Orthopedics;  Laterality: Left;    Current Outpatient Prescriptions  Medication Sig Dispense Refill  . albuterol (PROVENTIL HFA;VENTOLIN HFA) 108 (90 BASE) MCG/ACT inhaler Inhale 2 puffs into the lungs every 6 (six) hours as needed. For shortness of breath      . ALPHA LIPOIC ACID PO Take 200 mg by mouth daily.      Marland Kitchen atenolol (TENORMIN) 25 MG tablet TAKE 1 TABLET EVERY OTHER DAY  45 tablet  0  . b complex vitamins capsule Take 1 capsule by mouth daily.       Marland Kitchen buPROPion (WELLBUTRIN XL) 150 MG 24 hr tablet Take 150 mg by mouth every morning.      . cholecalciferol (VITAMIN D) 1000 UNITS tablet Take 1,000 Units by mouth daily.      . ciclesonide (OMNARIS) 50 MCG/ACT nasal spray Place 2 sprays into both nostrils as needed.       . clonazePAM (KLONOPIN) 1 MG tablet Take 1 mg by mouth at bedtime as needed. For insomnia      . cyanocobalamin 2000 MCG tablet Take 2,000 mcg by mouth daily.      Marland Kitchen docusate sodium (COLACE) 100 MG capsule Take 200 mg by mouth every evening.       . DULoxetine (CYMBALTA) 60 MG capsule Take 60 mg by mouth every morning.      Marland Kitchen esomeprazole (NEXIUM) 40 MG capsule Take 40 mg by mouth daily.       Marland Kitchen estradiol (ESTRING) 2 MG vaginal ring Place 2 mg vaginally every 3 (three) months. follow package directions      . estradiol (VIVELLE-DOT) 0.025 MG/24HR Place  1 patch onto the skin 2 (two) times a week. Applies new patch on Sunday & Thursday      . fexofenadine (ALLEGRA) 180 MG tablet Take 180 mg by mouth daily.       . fish oil-omega-3 fatty acids 1000 MG capsule Take 1 g by mouth daily.       Marland Kitchen  KLOR-CON M10 10 MEQ tablet TAKE 1 TABLET DAILY  30 tablet  0  . levothyroxine (SYNTHROID, LEVOTHROID) 112 MCG tablet Take 112 mcg by mouth every morning.       . magnesium oxide (MAG-OX) 400 MG tablet Take 400 mg by mouth daily.      . montelukast (SINGULAIR) 10 MG tablet Take 10 mg by mouth as needed.       Marland Kitchen PRESCRIPTION MEDICATION ALLERGY SHOT 2 TIMES A WEEK BY DR. Neldon Mc      . Probiotic Product (ALIGN) 4 MG CAPS Take 4 mg by mouth at bedtime.        No current facility-administered medications for this visit.   Facility-Administered Medications Ordered in Other Visits  Medication Dose Route Frequency Provider Last Rate Last Dose  . bupivacaine (MARCAINE) 0.5 % 10 mL, triamcinolone acetonide (KENALOG-40) 40 mg injection   Subcutaneous Once Ailene Rud, MD      . bupivacaine (MARCAINE) 0.5 % 15 mL, phenazopyridine (PYRIDIUM) 400 mg bladder mixture   Bladder Instillation Once Ailene Rud, MD        Allergies  Allergen Reactions  . Anesthetics, Amide Other (See Comments)    Patient unsure of names, however multiples cause swelling of airway & nausea  . Sulfonamide Derivatives Anaphylaxis and Swelling  . Azithromycin Other (See Comments)    Severe gastritis   . Bactroban Other (See Comments)    Causes sores in nose  . Ciprofloxacin     REACTION: joint swelling  . Codeine Swelling  . Meperidine Hcl Nausea Only    Hallucinations   . Morphine Nausea Only    Hallucinations   . Neosporin [Neomycin-Polymyxin-Gramicidin] Hives and Dermatitis    All topical "orin's ointment"  . Nitrofurantoin     REACTION: neuropathy in legs  . Other     ALLERGIC TO WARM WATER SHELLFISH  PT STATES PLEASE NOTE SHE CAN TAKE OXYCODONE AND TYLENOL  FOR PAIN -NOT ALLERGIC  . Penicillins Other (See Comments)    Swelling in joints  . Tramadol     Makes jerk  . Latex Rash    Review of Systems negative except from HPI and PMH  Physical Exam BP 114/79  Pulse 79  Ht 5\' 7"  (1.702 m)  Wt 149 lb 9.6 oz (67.858 kg)  BMI 23.43 kg/m2 Well developed and well nourished in no acute distress HENT normal E scleral and icterus clear Neck Supple JVP flat; carotids brisk and full Clear to ausculation  Regular rate and rhythm, no murmurs gallops or rub Soft with active bowel sounds No clubbing cyanosis no Edema Alert and oriented, grossly normal motor and sensory function Skin Warm and Dry    Assessment and  Plan  High-grade heart block  Pacemaker-[employer]  Exercise associated hypotension  ? Dysautonomia  Central polyneuropathy  Her objective data suggests hypotension as a potential contributor to her fatigue. Is not clear whether it is provoked by  Exercise or simply associated with it. She will undertake trials at home she measures her blood pressure at the bottom of the stairs and at the top of the stairs. We will then submit her for treadmill testing looking to see heart rate and blood pressure response  i am not sure what role polyneuropathy is playing although some associations are mentioned with autonomic dysfunction  Neurological input may be available

## 2014-01-07 NOTE — Patient Instructions (Addendum)
Your physician recommends that you continue on your current medications as directed. Please refer to the Current Medication list given to you today.  Your physician has requested that you have an exercise tolerance test. For further information please visit [email or web address]. Please also follow instruction sheet, as given.  Remote monitoring is used to monitor your pacemaker from home. This monitoring reduces the number of office visits required to check your device to one time per year. It allows [place] to keep an eye on the functioning of your device to ensure it is working properly. You are scheduled for a device check from home on 04-12-2014. You may send your transmission at any time that day. If you have a wireless device, the transmission will be sent automatically. After your physician reviews your transmission, you will receive a postcard with your next transmission date.  Your physician recommends that you schedule a follow-up appointment in: 12 months with Dr.Klein

## 2014-01-14 DIAGNOSIS — J309 Allergic rhinitis, unspecified: Secondary | ICD-10-CM | POA: Diagnosis not present

## 2014-01-20 ENCOUNTER — Encounter: Payer: Self-pay | Admitting: Internal Medicine

## 2014-01-28 DIAGNOSIS — J309 Allergic rhinitis, unspecified: Secondary | ICD-10-CM | POA: Diagnosis not present

## 2014-02-09 ENCOUNTER — Ambulatory Visit (INDEPENDENT_AMBULATORY_CARE_PROVIDER_SITE_OTHER): Payer: Medicare Other | Admitting: Internal Medicine

## 2014-02-09 DIAGNOSIS — I951 Orthostatic hypotension: Secondary | ICD-10-CM | POA: Diagnosis not present

## 2014-02-09 DIAGNOSIS — I441 Atrioventricular block, second degree: Secondary | ICD-10-CM

## 2014-02-09 MED ORDER — MIDODRINE HCL 2.5 MG PO TABS: 2.5000 mg | ORAL_TABLET | Freq: Three times a day (TID) | ORAL | Status: DC

## 2014-02-09 NOTE — Patient Instructions (Signed)
Your physician has recommended you make the following change in your medication:  1) START Midodrine 2.5 mg three times a day.

## 2014-02-09 NOTE — Progress Notes (Signed)
Exercise Treadmill Test  Pre-Exercise Testing Evaluation Rhythm: normal sinus  Rate: 77 bpm     Test  Exercise Tolerance Test Ordering MD: Virl Axe, MD  Interpreting MD: Virl Axe, MD  Unique Test No: 1  Treadmill:  1  Indication for ETT: orthostatic hypotension  Contraindication to ETT: No   Stress Modality: exercise - treadmill  Cardiac Imaging Performed: non   Protocol: standard Bruce - maximal  Max BP:  120/80  Max MPHR (bpm):  151 85% MPR (bpm):  128  MPHR obtained (bpm):  110 % MPHR obtained:    Reached 85% MPHR (min:sec):   Total Exercise Time (min-sec):    Workload in METS:   Borg Scale:   Reason ETT Terminated:      ST Segment Analysis At Rest:  With Exercise:   Other Information Arrhythmia:   Angina during ETT:   Quality of ETT:    ETT Interpretation:    Comments:    Recommendations: The patient has resting hypotension some degree of exercise associated hypotension clearly she is markedly functionally limited unable to complete stage I.  We had a 30-45 minute discussion regarding the physiology of her hypotension the possibility of whether her vasomotor instability and autonomic hypotension issues are related to her "central neuropathy".  We discussed nonpharmacological and pharmacologic maneuvers for managing her hypotension including the use of ProAmatine and abdominal binder. I suggested that she followup with Dr. [name] who might be able to help coordinate neurological evaluation.

## 2014-02-16 DIAGNOSIS — J309 Allergic rhinitis, unspecified: Secondary | ICD-10-CM | POA: Diagnosis not present

## 2014-02-16 DIAGNOSIS — J4540 Moderate persistent asthma, uncomplicated: Secondary | ICD-10-CM | POA: Diagnosis not present

## 2014-02-17 DIAGNOSIS — D2261 Melanocytic nevi of right upper limb, including shoulder: Secondary | ICD-10-CM | POA: Diagnosis not present

## 2014-02-17 DIAGNOSIS — L738 Other specified follicular disorders: Secondary | ICD-10-CM | POA: Diagnosis not present

## 2014-02-17 DIAGNOSIS — L814 Other melanin hyperpigmentation: Secondary | ICD-10-CM | POA: Diagnosis not present

## 2014-02-17 DIAGNOSIS — L570 Actinic keratosis: Secondary | ICD-10-CM | POA: Diagnosis not present

## 2014-02-17 DIAGNOSIS — L812 Freckles: Secondary | ICD-10-CM | POA: Diagnosis not present

## 2014-03-05 DIAGNOSIS — J309 Allergic rhinitis, unspecified: Secondary | ICD-10-CM | POA: Diagnosis not present

## 2014-03-12 DIAGNOSIS — J309 Allergic rhinitis, unspecified: Secondary | ICD-10-CM | POA: Diagnosis not present

## 2014-03-12 DIAGNOSIS — R197 Diarrhea, unspecified: Secondary | ICD-10-CM | POA: Diagnosis not present

## 2014-03-12 DIAGNOSIS — Z79899 Other long term (current) drug therapy: Secondary | ICD-10-CM | POA: Diagnosis not present

## 2014-03-12 DIAGNOSIS — G8929 Other chronic pain: Secondary | ICD-10-CM | POA: Diagnosis not present

## 2014-03-12 DIAGNOSIS — K529 Noninfective gastroenteritis and colitis, unspecified: Secondary | ICD-10-CM | POA: Diagnosis not present

## 2014-03-12 DIAGNOSIS — K5900 Constipation, unspecified: Secondary | ICD-10-CM | POA: Diagnosis not present

## 2014-03-12 DIAGNOSIS — R1013 Epigastric pain: Secondary | ICD-10-CM | POA: Diagnosis not present

## 2014-03-16 DIAGNOSIS — Z23 Encounter for immunization: Secondary | ICD-10-CM | POA: Diagnosis not present

## 2014-03-16 DIAGNOSIS — Z124 Encounter for screening for malignant neoplasm of cervix: Secondary | ICD-10-CM | POA: Diagnosis not present

## 2014-03-16 DIAGNOSIS — Z01419 Encounter for gynecological examination (general) (routine) without abnormal findings: Secondary | ICD-10-CM | POA: Diagnosis not present

## 2014-03-18 DIAGNOSIS — J309 Allergic rhinitis, unspecified: Secondary | ICD-10-CM | POA: Diagnosis not present

## 2014-03-19 ENCOUNTER — Other Ambulatory Visit: Payer: Self-pay

## 2014-03-19 MED ORDER — ATENOLOL 25 MG PO TABS: ORAL_TABLET | ORAL | Status: DC

## 2014-03-22 DIAGNOSIS — H25813 Combined forms of age-related cataract, bilateral: Secondary | ICD-10-CM | POA: Diagnosis not present

## 2014-03-24 DIAGNOSIS — Z96642 Presence of left artificial hip joint: Secondary | ICD-10-CM | POA: Diagnosis not present

## 2014-03-24 DIAGNOSIS — J309 Allergic rhinitis, unspecified: Secondary | ICD-10-CM | POA: Diagnosis not present

## 2014-04-01 DIAGNOSIS — J309 Allergic rhinitis, unspecified: Secondary | ICD-10-CM | POA: Diagnosis not present

## 2014-04-12 ENCOUNTER — Ambulatory Visit (INDEPENDENT_AMBULATORY_CARE_PROVIDER_SITE_OTHER): Payer: Medicare Other | Admitting: *Deleted

## 2014-04-12 DIAGNOSIS — I471 Supraventricular tachycardia: Secondary | ICD-10-CM

## 2014-04-12 DIAGNOSIS — R000 Tachycardia, unspecified: Secondary | ICD-10-CM

## 2014-04-12 NOTE — Progress Notes (Signed)
Remote pacemaker transmission.   

## 2014-04-14 DIAGNOSIS — J309 Allergic rhinitis, unspecified: Secondary | ICD-10-CM | POA: Diagnosis not present

## 2014-04-15 LAB — MDC_IDC_ENUM_SESS_TYPE_REMOTE
Battery Voltage: 3.01 V
Brady Statistic AP VP Percent: 1.1 %
Brady Statistic AP VS Percent: 0 %
Brady Statistic AS VP Percent: 98.88 %
Brady Statistic RA Percent Paced: 1.1 %
Brady Statistic RV Percent Paced: 99.98 %
Lead Channel Impedance Value: 456 Ohm
Lead Channel Impedance Value: 646 Ohm
Lead Channel Pacing Threshold Amplitude: 0.625 V
Lead Channel Pacing Threshold Pulse Width: 0.4 ms
Lead Channel Pacing Threshold Pulse Width: 0.4 ms
Lead Channel Sensing Intrinsic Amplitude: 1.875 mV
Lead Channel Sensing Intrinsic Amplitude: 7 mV
Lead Channel Setting Pacing Amplitude: 2.75 V
Lead Channel Setting Sensing Sensitivity: 0.9 mV
MDC IDC MSMT BATTERY REMAINING LONGEVITY: 96 mo
MDC IDC MSMT LEADCHNL RA IMPEDANCE VALUE: 323 Ohm
MDC IDC MSMT LEADCHNL RA PACING THRESHOLD AMPLITUDE: 1.125 V
MDC IDC MSMT LEADCHNL RA SENSING INTR AMPL: 1.875 mV
MDC IDC MSMT LEADCHNL RV IMPEDANCE VALUE: 684 Ohm
MDC IDC MSMT LEADCHNL RV SENSING INTR AMPL: 16.25 mV
MDC IDC SESS DTM: 20151130214458
MDC IDC SET LEADCHNL RV PACING AMPLITUDE: 2.5 V
MDC IDC SET LEADCHNL RV PACING PULSEWIDTH: 0.4 ms
MDC IDC STAT BRADY AS VS PERCENT: 0.02 %
Zone Setting Detection Interval: 400 ms
Zone Setting Detection Interval: 400 ms

## 2014-04-22 ENCOUNTER — Encounter (HOSPITAL_COMMUNITY): Payer: Self-pay | Admitting: Internal Medicine

## 2014-04-22 DIAGNOSIS — J309 Allergic rhinitis, unspecified: Secondary | ICD-10-CM | POA: Diagnosis not present

## 2014-04-27 DIAGNOSIS — J309 Allergic rhinitis, unspecified: Secondary | ICD-10-CM | POA: Diagnosis not present

## 2014-04-28 DIAGNOSIS — R1032 Left lower quadrant pain: Secondary | ICD-10-CM | POA: Diagnosis not present

## 2014-04-28 DIAGNOSIS — Z6823 Body mass index (BMI) 23.0-23.9, adult: Secondary | ICD-10-CM | POA: Diagnosis not present

## 2014-04-30 ENCOUNTER — Telehealth: Payer: Self-pay | Admitting: Internal Medicine

## 2014-04-30 NOTE — Telephone Encounter (Signed)
Received request from Nurse fax box, documents faxed for surgical clearance. To: Burnell Blanks. [place] Fax number: [phone] Attention: 12.18.15/KM

## 2014-05-24 ENCOUNTER — Other Ambulatory Visit: Payer: Self-pay | Admitting: Internal Medicine

## 2014-05-24 DIAGNOSIS — J309 Allergic rhinitis, unspecified: Secondary | ICD-10-CM | POA: Diagnosis not present

## 2014-05-28 ENCOUNTER — Encounter: Payer: Self-pay | Admitting: Cardiology

## 2014-06-02 ENCOUNTER — Encounter: Payer: Self-pay | Admitting: Internal Medicine

## 2014-06-17 DIAGNOSIS — J309 Allergic rhinitis, unspecified: Secondary | ICD-10-CM | POA: Diagnosis not present

## 2014-06-18 DIAGNOSIS — J309 Allergic rhinitis, unspecified: Secondary | ICD-10-CM | POA: Diagnosis not present

## 2014-06-22 DIAGNOSIS — G909 Disorder of the autonomic nervous system, unspecified: Secondary | ICD-10-CM | POA: Diagnosis not present

## 2014-06-22 DIAGNOSIS — Z950 Presence of cardiac pacemaker: Secondary | ICD-10-CM | POA: Diagnosis not present

## 2014-07-13 ENCOUNTER — Ambulatory Visit (INDEPENDENT_AMBULATORY_CARE_PROVIDER_SITE_OTHER): Payer: Medicare Other | Admitting: *Deleted

## 2014-07-13 DIAGNOSIS — R001 Bradycardia, unspecified: Secondary | ICD-10-CM

## 2014-07-13 DIAGNOSIS — J309 Allergic rhinitis, unspecified: Secondary | ICD-10-CM | POA: Diagnosis not present

## 2014-07-13 NOTE — Progress Notes (Signed)
Remote pacemaker transmission.   

## 2014-07-15 DIAGNOSIS — K5730 Diverticulosis of large intestine without perforation or abscess without bleeding: Secondary | ICD-10-CM | POA: Diagnosis not present

## 2014-07-15 DIAGNOSIS — R197 Diarrhea, unspecified: Secondary | ICD-10-CM | POA: Diagnosis not present

## 2014-07-15 DIAGNOSIS — Z8371 Family history of colonic polyps: Secondary | ICD-10-CM | POA: Diagnosis not present

## 2014-07-15 DIAGNOSIS — K5289 Other specified noninfective gastroenteritis and colitis: Secondary | ICD-10-CM | POA: Diagnosis not present

## 2014-07-15 DIAGNOSIS — K319 Disease of stomach and duodenum, unspecified: Secondary | ICD-10-CM | POA: Diagnosis not present

## 2014-07-15 DIAGNOSIS — R1013 Epigastric pain: Secondary | ICD-10-CM | POA: Diagnosis not present

## 2014-07-15 DIAGNOSIS — D131 Benign neoplasm of stomach: Secondary | ICD-10-CM | POA: Diagnosis not present

## 2014-07-15 DIAGNOSIS — K317 Polyp of stomach and duodenum: Secondary | ICD-10-CM | POA: Diagnosis not present

## 2014-07-15 DIAGNOSIS — K2950 Unspecified chronic gastritis without bleeding: Secondary | ICD-10-CM | POA: Diagnosis not present

## 2014-07-21 LAB — MDC_IDC_ENUM_SESS_TYPE_REMOTE
Battery Voltage: 3.01 V
Brady Statistic AP VP Percent: 3.39 %
Brady Statistic AP VS Percent: 0 %
Brady Statistic AS VP Percent: 96.59 %
Brady Statistic AS VS Percent: 0.02 %
Brady Statistic RA Percent Paced: 3.39 %
Date Time Interrogation Session: 20160301153731
Lead Channel Impedance Value: 646 Ohm
Lead Channel Impedance Value: 684 Ohm
Lead Channel Pacing Threshold Amplitude: 1.25 V
Lead Channel Sensing Intrinsic Amplitude: 1.625 mV
Lead Channel Setting Pacing Amplitude: 2.5 V
Lead Channel Setting Pacing Amplitude: 2.5 V
MDC IDC MSMT BATTERY REMAINING LONGEVITY: 95 mo
MDC IDC MSMT LEADCHNL RA IMPEDANCE VALUE: 323 Ohm
MDC IDC MSMT LEADCHNL RA IMPEDANCE VALUE: 437 Ohm
MDC IDC MSMT LEADCHNL RA PACING THRESHOLD PULSEWIDTH: 0.4 ms
MDC IDC MSMT LEADCHNL RV PACING THRESHOLD AMPLITUDE: 0.625 V
MDC IDC MSMT LEADCHNL RV PACING THRESHOLD PULSEWIDTH: 0.4 ms
MDC IDC MSMT LEADCHNL RV SENSING INTR AMPL: 6.875 mV
MDC IDC SET LEADCHNL RV PACING PULSEWIDTH: 0.4 ms
MDC IDC SET LEADCHNL RV SENSING SENSITIVITY: 0.9 mV
MDC IDC STAT BRADY RV PERCENT PACED: 99.98 %
Zone Setting Detection Interval: 400 ms
Zone Setting Detection Interval: 400 ms

## 2014-07-23 ENCOUNTER — Encounter: Payer: Self-pay | Admitting: Internal Medicine

## 2014-07-27 DIAGNOSIS — J309 Allergic rhinitis, unspecified: Secondary | ICD-10-CM | POA: Diagnosis not present

## 2014-07-30 DIAGNOSIS — J45909 Unspecified asthma, uncomplicated: Secondary | ICD-10-CM | POA: Diagnosis not present

## 2014-07-30 DIAGNOSIS — G909 Disorder of the autonomic nervous system, unspecified: Secondary | ICD-10-CM | POA: Diagnosis not present

## 2014-07-30 DIAGNOSIS — Z950 Presence of cardiac pacemaker: Secondary | ICD-10-CM | POA: Diagnosis not present

## 2014-07-30 DIAGNOSIS — F909 Attention-deficit hyperactivity disorder, unspecified type: Secondary | ICD-10-CM | POA: Diagnosis not present

## 2014-07-30 DIAGNOSIS — Z6823 Body mass index (BMI) 23.0-23.9, adult: Secondary | ICD-10-CM | POA: Diagnosis not present

## 2014-07-30 DIAGNOSIS — F329 Major depressive disorder, single episode, unspecified: Secondary | ICD-10-CM | POA: Diagnosis not present

## 2014-07-30 DIAGNOSIS — K219 Gastro-esophageal reflux disease without esophagitis: Secondary | ICD-10-CM | POA: Diagnosis not present

## 2014-07-30 DIAGNOSIS — I480 Paroxysmal atrial fibrillation: Secondary | ICD-10-CM | POA: Diagnosis not present

## 2014-08-05 DIAGNOSIS — J309 Allergic rhinitis, unspecified: Secondary | ICD-10-CM | POA: Diagnosis not present

## 2014-08-10 ENCOUNTER — Other Ambulatory Visit: Payer: Self-pay | Admitting: Internal Medicine

## 2014-08-16 ENCOUNTER — Encounter: Payer: Self-pay | Admitting: Cardiology

## 2014-08-20 DIAGNOSIS — J309 Allergic rhinitis, unspecified: Secondary | ICD-10-CM | POA: Diagnosis not present

## 2014-08-23 DIAGNOSIS — J309 Allergic rhinitis, unspecified: Secondary | ICD-10-CM | POA: Diagnosis not present

## 2014-08-24 ENCOUNTER — Other Ambulatory Visit: Payer: Self-pay | Admitting: Dermatology

## 2014-08-24 DIAGNOSIS — L570 Actinic keratosis: Secondary | ICD-10-CM | POA: Diagnosis not present

## 2014-08-24 DIAGNOSIS — D220 Melanocytic nevi of lip: Secondary | ICD-10-CM | POA: Diagnosis not present

## 2014-08-24 DIAGNOSIS — L738 Other specified follicular disorders: Secondary | ICD-10-CM | POA: Diagnosis not present

## 2014-08-24 DIAGNOSIS — L821 Other seborrheic keratosis: Secondary | ICD-10-CM | POA: Diagnosis not present

## 2014-08-24 DIAGNOSIS — L814 Other melanin hyperpigmentation: Secondary | ICD-10-CM | POA: Diagnosis not present

## 2014-08-24 DIAGNOSIS — D1801 Hemangioma of skin and subcutaneous tissue: Secondary | ICD-10-CM | POA: Diagnosis not present

## 2014-08-24 DIAGNOSIS — D485 Neoplasm of uncertain behavior of skin: Secondary | ICD-10-CM | POA: Diagnosis not present

## 2014-08-24 DIAGNOSIS — L720 Epidermal cyst: Secondary | ICD-10-CM | POA: Diagnosis not present

## 2014-08-30 ENCOUNTER — Encounter: Payer: Self-pay | Admitting: Internal Medicine

## 2014-08-31 DIAGNOSIS — J309 Allergic rhinitis, unspecified: Secondary | ICD-10-CM | POA: Diagnosis not present

## 2014-09-17 DIAGNOSIS — K529 Noninfective gastroenteritis and colitis, unspecified: Secondary | ICD-10-CM | POA: Diagnosis not present

## 2014-09-17 DIAGNOSIS — G8929 Other chronic pain: Secondary | ICD-10-CM | POA: Diagnosis not present

## 2014-09-17 DIAGNOSIS — K219 Gastro-esophageal reflux disease without esophagitis: Secondary | ICD-10-CM | POA: Diagnosis not present

## 2014-09-17 DIAGNOSIS — R1013 Epigastric pain: Secondary | ICD-10-CM | POA: Diagnosis not present

## 2014-10-05 DIAGNOSIS — R51 Headache: Secondary | ICD-10-CM | POA: Diagnosis not present

## 2014-10-06 ENCOUNTER — Other Ambulatory Visit (HOSPITAL_COMMUNITY): Payer: Self-pay | Admitting: *Deleted

## 2014-10-06 ENCOUNTER — Ambulatory Visit (HOSPITAL_COMMUNITY)
Admission: RE | Admit: 2014-10-06 | Discharge: 2014-10-06 | Disposition: A | Discharge status: Home / Self Care | Payer: TRICARE For Life (TFL) | Source: Ambulatory Visit | Attending: Internal Medicine | Admitting: Internal Medicine

## 2014-10-06 ENCOUNTER — Encounter (HOSPITAL_COMMUNITY): Payer: Self-pay | Admitting: Emergency Medicine

## 2014-10-06 ENCOUNTER — Other Ambulatory Visit (HOSPITAL_COMMUNITY): Payer: Self-pay | Admitting: Internal Medicine

## 2014-10-06 ENCOUNTER — Inpatient Hospital Stay (HOSPITAL_COMMUNITY)
Admission: EM | Admit: 2014-10-06 | Discharge: 2014-10-08 | DRG: 103 | Disposition: A | Discharge status: Home / Self Care | Payer: Medicare Other | Attending: Internal Medicine | Admitting: Internal Medicine

## 2014-10-06 ENCOUNTER — Emergency Department (HOSPITAL_COMMUNITY): Payer: Medicare Other

## 2014-10-06 DIAGNOSIS — J45909 Unspecified asthma, uncomplicated: Secondary | ICD-10-CM | POA: Diagnosis present

## 2014-10-06 DIAGNOSIS — Z881 Allergy status to other antibiotic agents status: Secondary | ICD-10-CM

## 2014-10-06 DIAGNOSIS — Z9011 Acquired absence of right breast and nipple: Secondary | ICD-10-CM | POA: Diagnosis present

## 2014-10-06 DIAGNOSIS — H578 Other specified disorders of eye and adnexa: Secondary | ICD-10-CM | POA: Diagnosis present

## 2014-10-06 DIAGNOSIS — K529 Noninfective gastroenteritis and colitis, unspecified: Secondary | ICD-10-CM | POA: Diagnosis present

## 2014-10-06 DIAGNOSIS — R4781 Slurred speech: Secondary | ICD-10-CM | POA: Diagnosis not present

## 2014-10-06 DIAGNOSIS — Z885 Allergy status to narcotic agent status: Secondary | ICD-10-CM

## 2014-10-06 DIAGNOSIS — G441 Vascular headache, not elsewhere classified: Secondary | ICD-10-CM

## 2014-10-06 DIAGNOSIS — M5481 Occipital neuralgia: Secondary | ICD-10-CM | POA: Insufficient documentation

## 2014-10-06 DIAGNOSIS — Z853 Personal history of malignant neoplasm of breast: Secondary | ICD-10-CM

## 2014-10-06 DIAGNOSIS — Z886 Allergy status to analgesic agent status: Secondary | ICD-10-CM

## 2014-10-06 DIAGNOSIS — H532 Diplopia: Secondary | ICD-10-CM | POA: Diagnosis not present

## 2014-10-06 DIAGNOSIS — H538 Other visual disturbances: Secondary | ICD-10-CM | POA: Diagnosis not present

## 2014-10-06 DIAGNOSIS — R51 Headache: Secondary | ICD-10-CM | POA: Diagnosis not present

## 2014-10-06 DIAGNOSIS — Z880 Allergy status to penicillin: Secondary | ICD-10-CM

## 2014-10-06 DIAGNOSIS — R29810 Facial weakness: Secondary | ICD-10-CM | POA: Diagnosis not present

## 2014-10-06 DIAGNOSIS — G629 Polyneuropathy, unspecified: Secondary | ICD-10-CM | POA: Diagnosis not present

## 2014-10-06 DIAGNOSIS — E039 Hypothyroidism, unspecified: Secondary | ICD-10-CM | POA: Diagnosis present

## 2014-10-06 DIAGNOSIS — Z90710 Acquired absence of both cervix and uterus: Secondary | ICD-10-CM

## 2014-10-06 DIAGNOSIS — Z6822 Body mass index (BMI) 22.0-22.9, adult: Secondary | ICD-10-CM | POA: Diagnosis not present

## 2014-10-06 DIAGNOSIS — G8190 Hemiplegia, unspecified affecting unspecified side: Secondary | ICD-10-CM | POA: Diagnosis not present

## 2014-10-06 DIAGNOSIS — R519 Headache, unspecified: Secondary | ICD-10-CM | POA: Diagnosis present

## 2014-10-06 DIAGNOSIS — F909 Attention-deficit hyperactivity disorder, unspecified type: Secondary | ICD-10-CM | POA: Diagnosis present

## 2014-10-06 DIAGNOSIS — I639 Cerebral infarction, unspecified: Secondary | ICD-10-CM | POA: Diagnosis not present

## 2014-10-06 DIAGNOSIS — Z91040 Latex allergy status: Secondary | ICD-10-CM

## 2014-10-06 DIAGNOSIS — G513 Clonic hemifacial spasm: Secondary | ICD-10-CM | POA: Diagnosis present

## 2014-10-06 DIAGNOSIS — E785 Hyperlipidemia, unspecified: Secondary | ICD-10-CM | POA: Insufficient documentation

## 2014-10-06 DIAGNOSIS — Z884 Allergy status to anesthetic agent status: Secondary | ICD-10-CM

## 2014-10-06 DIAGNOSIS — Z7982 Long term (current) use of aspirin: Secondary | ICD-10-CM

## 2014-10-06 DIAGNOSIS — K219 Gastro-esophageal reflux disease without esophagitis: Secondary | ICD-10-CM | POA: Diagnosis present

## 2014-10-06 DIAGNOSIS — Z950 Presence of cardiac pacemaker: Secondary | ICD-10-CM | POA: Diagnosis present

## 2014-10-06 DIAGNOSIS — I6782 Cerebral ischemia: Secondary | ICD-10-CM | POA: Diagnosis not present

## 2014-10-06 DIAGNOSIS — G43109 Migraine with aura, not intractable, without status migrainosus: Secondary | ICD-10-CM | POA: Insufficient documentation

## 2014-10-06 DIAGNOSIS — Z882 Allergy status to sulfonamides status: Secondary | ICD-10-CM

## 2014-10-06 DIAGNOSIS — I951 Orthostatic hypotension: Secondary | ICD-10-CM | POA: Diagnosis present

## 2014-10-06 DIAGNOSIS — I10 Essential (primary) hypertension: Secondary | ICD-10-CM | POA: Diagnosis present

## 2014-10-06 DIAGNOSIS — Z96643 Presence of artificial hip joint, bilateral: Secondary | ICD-10-CM | POA: Diagnosis present

## 2014-10-06 LAB — I-STAT CHEM 8, ED
BUN: 19 mg/dL (ref 6–20)
CREATININE: 0.8 mg/dL (ref 0.44–1.00)
Calcium, Ion: 1.16 mmol/L (ref 1.13–1.30)
Chloride: 101 mmol/L (ref 101–111)
Glucose, Bld: 84 mg/dL (ref 65–99)
HEMATOCRIT: 48 % — AB (ref 36.0–46.0)
Hemoglobin: 16.3 g/dL — ABNORMAL HIGH (ref 12.0–15.0)
Potassium: 3.9 mmol/L (ref 3.5–5.1)
SODIUM: 138 mmol/L (ref 135–145)
TCO2: 22 mmol/L (ref 0–100)

## 2014-10-06 LAB — COMPREHENSIVE METABOLIC PANEL
ALBUMIN: 4.5 g/dL (ref 3.5–5.0)
ALT: 21 U/L (ref 14–54)
AST: 26 U/L (ref 15–41)
Alkaline Phosphatase: 50 U/L (ref 38–126)
Anion gap: 12 (ref 5–15)
BILIRUBIN TOTAL: 0.4 mg/dL (ref 0.3–1.2)
BUN: 15 mg/dL (ref 6–20)
CHLORIDE: 103 mmol/L (ref 101–111)
CO2: 21 mmol/L — ABNORMAL LOW (ref 22–32)
Calcium: 9.4 mg/dL (ref 8.9–10.3)
Creatinine, Ser: 0.89 mg/dL (ref 0.44–1.00)
GFR calc non Af Amer: >60 mL/min (ref >60)
GLUCOSE: 88 mg/dL (ref 65–99)
POTASSIUM: 3.8 mmol/L (ref 3.5–5.1)
SODIUM: 136 mmol/L (ref 135–145)
TOTAL PROTEIN: 7.4 g/dL (ref 6.5–8.1)

## 2014-10-06 LAB — URINALYSIS, ROUTINE W REFLEX MICROSCOPIC
BILIRUBIN URINE: NEGATIVE
Glucose, UA: NEGATIVE mg/dL
HGB URINE DIPSTICK: NEGATIVE
Ketones, ur: 40 mg/dL — AB
Leukocytes, UA: NEGATIVE
NITRITE: NEGATIVE
Protein, ur: NEGATIVE mg/dL
Specific Gravity, Urine: 1.019 (ref 1.005–1.030)
Urobilinogen, UA: 0.2 mg/dL (ref 0.0–1.0)
pH: 6.5 (ref 5.0–8.0)

## 2014-10-06 LAB — CBC
HCT: 44.3 % (ref 36.0–46.0)
Hemoglobin: 15.1 g/dL — ABNORMAL HIGH (ref 12.0–15.0)
MCH: 31.5 pg (ref 26.0–34.0)
MCHC: 34.1 g/dL (ref 30.0–36.0)
MCV: 92.3 fL (ref 78.0–100.0)
Platelets: 234 10*3/uL (ref 150–400)
RBC: 4.8 MIL/uL (ref 3.87–5.11)
RDW: 13.1 % (ref 11.5–15.5)
WBC: 6.8 10*3/uL (ref 4.0–10.5)

## 2014-10-06 LAB — DIFFERENTIAL
BASOS PCT: 0 % (ref 0–1)
Basophils Absolute: 0 10*3/uL (ref 0.0–0.1)
EOS PCT: 1 % (ref 0–5)
Eosinophils Absolute: 0.1 10*3/uL (ref 0.0–0.7)
LYMPHS ABS: 2.5 10*3/uL (ref 0.7–4.0)
Lymphocytes Relative: 36 % (ref 12–46)
MONOS PCT: 9 % (ref 3–12)
Monocytes Absolute: 0.6 10*3/uL (ref 0.1–1.0)
NEUTROS ABS: 3.7 10*3/uL (ref 1.7–7.7)
Neutrophils Relative %: 54 % (ref 43–77)

## 2014-10-06 LAB — APTT: APTT: 28 s (ref 24–37)

## 2014-10-06 LAB — PROTIME-INR
INR: 1.06 (ref 0.00–1.49)
PROTHROMBIN TIME: 14 s (ref 11.6–15.2)

## 2014-10-06 LAB — I-STAT TROPONIN, ED: TROPONIN I, POC: 0 ng/mL (ref 0.00–0.08)

## 2014-10-06 MED ORDER — SODIUM CHLORIDE 0.9 % IV BOLUS (SEPSIS)
1000.0000 mL | INTRAVENOUS | Status: AC
  Administered 2014-10-06: 1000 mL via INTRAVENOUS

## 2014-10-06 MED ORDER — METOCLOPRAMIDE HCL 5 MG/ML IJ SOLN
5.0000 mg | Freq: Once | INTRAMUSCULAR | Status: AC
  Administered 2014-10-06: 5 mg via INTRAVENOUS
  Filled 2014-10-06: qty 2

## 2014-10-06 MED ORDER — DIPHENHYDRAMINE HCL 50 MG/ML IJ SOLN
12.5000 mg | Freq: Once | INTRAMUSCULAR | Status: AC
  Administered 2014-10-06: 12.5 mg via INTRAVENOUS
  Filled 2014-10-06: qty 1

## 2014-10-06 NOTE — ED Provider Notes (Signed)
CSN: [id number]     Arrival date & time 10/06/14  1723 History   First MD Initiated Contact with Patient 10/06/14 1910     Chief Complaint  Patient presents with  . Stroke Symptoms     (Consider location/radiation/quality/duration/timing/severity/associated sxs/prior Treatment) Patient is a 69 y.o. female presenting with neurologic complaint. The history is provided by the patient.  Neurologic Problem This is a new problem. The current episode started more than 2 days ago. The problem occurs daily. The problem has been gradually worsening. Pertinent negatives include no chest pain, no abdominal pain, no headaches and no shortness of breath. Nothing aggravates the symptoms. Nothing relieves the symptoms. She has tried nothing for the symptoms. The treatment provided no relief.    Past Medical History  Diagnosis Date  . Diverticulosis   . GERD (gastroesophageal reflux disease)   . Esophageal stricture   . Hemorrhoids   . Mallory - Weiss tear     HEALED   . Interstitial cystitis   . ADHD (attention deficit hyperactivity disorder)   . Breast cancer     right side  . Blood transfusion   . Thyroid disease   . Allergy     trees/pollen, mold, fungus, dust mites. Takes allergy shots  . Latex allergy, contact dermatitis   . Cataract   . Depression   . Hernia of abdominal wall     spigelian hernia RLQ - SURGERY TO REPAIR  . H/O hiatal hernia   . Neuromuscular disorder     central nervous system neuropathy- seen per Dr Erling Cruz  . Asthma     allergist Dr Olena Heckle- monthly allergy injections  . Complication of anesthesia     reaction to some anesthetics/ 7/12 anesth record on chart- states prefers epidural  . PONV (postoperative nausea and vomiting)     pt needs scop patch  . Recurrent upper respiratory infection (URI) 1/13- to present    bronchitis following surgery- states improved but still with cough. OV with [facility] Dr [name] 09/06/11 on chart  . Symptomatic bradycardia     a.  12/2012 s/p MDT dual chamber PPM, ser # [id number]; b. 12/2012 post-op course complicated by pericardial effusinon req lead revision.  . Pericardial effusion     a. 12/2012 following ppm placement;  b. 01/01/2013 Echo: EF 55-60%, small pericardial effusion w/o RV collapse-->No need for tap/window.  . Chest pain     a. 12/2012 Cath: nl cors, EF 55-65%.  . Autonomic dysfunction     CENTRAL NERVOUS SYSTEM NEUROPATHY - DX BY DR. Erling Cruz MORE THAN 10 YRS AGO - AND IT IS FELT TO CONTRIBUTE TO THE AUTOMIC  DYSFUNCTION-- PT HAS NUMBNESS LEGS AND FEET AND SOMETIIMES TIPS OF FINGER, SEVERE CONSTIPATION( NO SENSATION TO HAVE BM ), DOUBLE VISION, ORTHOSTATIC HYPOTENSION  . Hypothyroidism   . Bruises easily   . Pacemaker   . Dysrhythmia     HX OF HIGH GRADE HEART BLOCK - REQUIRED PACEMAKER INSERTION  . Shortness of breath     AT TIMES - BUT MUCH IMPROVED AFTER PACEMAKER WAS REPROGRAMED.  . Arthritis     PAIN AND OA LEFT HIP   Past Surgical History  Procedure Laterality Date  . Mastectomy modified radical      right; with immediate reconstruction  . Laparoscopy  1973  . Abdominal hysterectomy  1974  . Cystoscopy  1975, 2007  . Myringoplasty  1962  . Tympanoplasty  1973    right  . Oophorectomy  1982  .  Bladder suspension    . Rectocele repair      with cystocele repair  . Total hip arthroplasty Right   . Ventral hernia repair  05/30/2011    Procedure: HERNIA REPAIR VENTRAL ADULT;  Surgeon: Haywood Lasso, MD;  Location: [facility];  Service: General;  Laterality: Right;  repair right spigelian hernia  . Breast biopsy  2002    NO BLOOD PRESSURES ON RIGHT SIDE/   s/p  axillary node dissection  . Tonsillectomy    . Back surgery      cervical fusion 4-5 with plate  . Cysto with hydrodistension  09/07/2011    Procedure: CYSTOSCOPY/HYDRODISTENSION;  Surgeon: Ailene Rud, MD;  Location: [facility];  Service: Urology;  Laterality: N/A;  INSTILLATION OF MARCAINE/PYRIDIUM INSTILLATION  OF MARCAINE/KENALOG  . Pacemaker insertion    . Eye surgery      LASIK EYE SURGERY BILATERAL  . Ulnar nerve transposition Right   . Total hip arthroplasty Left 06/12/2013    Procedure: LEFT TOTAL HIP ARTHROPLASTY ANTERIOR APPROACH;  Surgeon: Mcarthur Rossetti, MD;  Location: [facility];  Service: Orthopedics;  Laterality: Left;  . Permanent pacemaker insertion N/A 12/29/2012    Procedure: PERMANENT PACEMAKER INSERTION;  Surgeon: Deboraha Sprang, MD;  Location: [facility] CATH LAB;  Service: Cardiovascular;  Laterality: N/A;  . Left heart catheterization with coronary angiogram N/A 12/29/2012    Procedure: LEFT HEART CATHETERIZATION WITH CORONARY ANGIOGRAM;  Surgeon: Peter M Martinique, MD;  Location: [facility] CATH LAB;  Service: Cardiovascular;  Laterality: N/A;  . Lead revision N/A 12/30/2012    Procedure: LEAD REVISION;  Surgeon: Evans Lance, MD;  Location: [facility] CATH LAB;  Service: Cardiovascular;  Laterality: N/A;   Family History  Problem Relation Age of Onset  . Heart disease Father   . Diabetes      aunt  . Uterine cancer      grandmother  . Colon cancer Mother   . Depression Mother   . Cancer Mother     colon  . Pancreatic cancer Sister   . Cancer Sister     pancreatic  . Cancer Maternal Grandmother     ovarian   History  Substance Use Topics  . Smoking status: Never Smoker   . Smokeless tobacco: Never Used  . Alcohol Use: 4.2 oz/week    7 Glasses of wine per week     Comment: nightly   OB History    No data available     Review of Systems  Constitutional: Negative for fever and fatigue.  HENT: Negative for congestion and drooling.   Eyes: Positive for photophobia. Negative for pain.  Respiratory: Negative for cough and shortness of breath.   Cardiovascular: Negative for chest pain.  Gastrointestinal: Negative for nausea, vomiting, abdominal pain and diarrhea.  Genitourinary: Negative for dysuria and hematuria.  Musculoskeletal: Negative for back pain, gait problem and neck pain.   Skin: Negative for color change.  Neurological: Negative for dizziness and headaches.  Hematological: Negative for adenopathy.  Psychiatric/Behavioral: Negative for behavioral problems.  All other systems reviewed and are negative.     Allergies  Anesthetics, amide; Sulfonamide derivatives; Azithromycin; Bactroban; Ciprofloxacin; Codeine; Meperidine hcl; Morphine; Neosporin; Nitrofurantoin; Other; Penicillins; Tramadol; and Latex  Home Medications   Prior to Admission medications   Medication Sig Start Date End Date Taking? Authorizing Provider  albuterol (PROVENTIL HFA;VENTOLIN HFA) 108 (90 BASE) MCG/ACT inhaler Inhale 2 puffs into the lungs every 6 (six) hours as needed. For shortness  of breath   Yes Historical Provider, MD  ALPHA LIPOIC ACID PO Take 200 mg by mouth daily.   Yes Historical Provider, MD  b complex vitamins capsule Take 1 capsule by mouth daily.    Yes Historical Provider, MD  cholecalciferol (VITAMIN D) 1000 UNITS tablet Take 1,000 Units by mouth daily.   Yes Historical Provider, MD  ciclesonide (OMNARIS) 50 MCG/ACT nasal spray Place 2 sprays into both nostrils as needed.    Yes Historical Provider, MD  cyanocobalamin 2000 MCG tablet Take 2,000 mcg by mouth daily.   Yes Historical Provider, MD  docusate sodium (COLACE) 100 MG capsule Take 200 mg by mouth every evening.    Yes Historical Provider, MD  DULoxetine (CYMBALTA) 60 MG capsule Take 60 mg by mouth every morning.   Yes Historical Provider, MD  esomeprazole (NEXIUM) 40 MG capsule Take 20 mg by mouth daily.    Yes Historical Provider, MD  estradiol (ESTRING) 2 MG vaginal ring Place 2 mg vaginally every 3 (three) months. follow package directions   Yes Historical Provider, MD  estradiol (VIVELLE-DOT) 0.025 MG/24HR Place 1 patch onto the skin 2 (two) times a week. Applies new patch on Sunday & Thursday   Yes Historical Provider, MD  fexofenadine (ALLEGRA) 180 MG tablet Take 180 mg by mouth daily.    Yes Historical  Provider, MD  fish oil-omega-3 fatty acids 1000 MG capsule Take 1 g by mouth daily.    Yes Historical Provider, MD  levothyroxine (SYNTHROID, LEVOTHROID) 112 MCG tablet Take 112 mcg by mouth every morning.    Yes Historical Provider, MD  magnesium oxide (MAG-OX) 400 MG tablet Take 400 mg by mouth daily.   Yes Historical Provider, MD  midodrine (PROAMATINE) 2.5 MG tablet TAKE 1 TABLET THREE TIMES A DAY WITH MEALS 05/25/14  Yes Deboraha Sprang, MD  montelukast (SINGULAIR) 10 MG tablet Take 10 mg by mouth as needed (allergies).    Yes Historical Provider, MD  potassium chloride (KLOR-CON M10) 10 MEQ tablet Take 1 tablet (10 mEq total) by mouth daily. 08/11/14  Yes Deboraha Sprang, MD  PRESCRIPTION MEDICATION ALLERGY SHOT 2 TIMES A WEEK BY DR. Neldon Mc   Yes Historical Provider, MD  Probiotic Product (ALIGN) 4 MG CAPS Take 4 mg by mouth at bedtime.    Yes Historical Provider, MD  atenolol (TENORMIN) 25 MG tablet TAKE 1 TABLET EVERY OTHER DAY Patient not taking: Reported on 10/06/2014 03/19/14   Deboraha Sprang, MD   BP 174/93 mmHg  Pulse 72  Temp(Src) 97.4 F (36.3 C) (Oral)  Resp 18  SpO2 99% Physical Exam  Constitutional: She is oriented to person, place, and time. She appears well-developed and well-nourished.  HENT:  Head: Normocephalic and atraumatic.  Mouth/Throat: Oropharynx is clear and moist. No oropharyngeal exudate.  Eyes: Conjunctivae and EOM are normal. Pupils are equal, round, and reactive to light.  Neck: Normal range of motion. Neck supple.  Cardiovascular: Normal rate, regular rhythm, normal heart sounds and intact distal pulses.  Exam reveals no gallop and no friction rub.   No murmur heard. Pulmonary/Chest: Effort normal and breath sounds normal. No respiratory distress. She has no wheezes.  Abdominal: Soft. Bowel sounds are normal. There is no tenderness. There is no rebound and no guarding.  Musculoskeletal: Normal range of motion. She exhibits no edema or tenderness.   Neurological: She is alert and oriented to person, place, and time.  alert, oriented x3 speech: possible slight slurring memory: intact grossly cranial nerves  II-XII: intact motor strength: 3/5 in LLE otherwise full proximally and distally no involuntary movements or tremors sensation: Altered sensation to light touch on the right side of the face otherwise intact to light touch diffusely  cerebellar: finger-to-nose and heel-to-shin intact gait: normal forwards and backwards w/ moderate assistance  Skin: Skin is warm and dry.  Psychiatric: She has a normal mood and affect. Her behavior is normal.  Nursing note and vitals reviewed.   ED Course  Procedures (including critical care time) Labs Review Labs Reviewed  CBC - Abnormal; Notable for the following:    Hemoglobin 15.1 (*)    All other components within normal limits  COMPREHENSIVE METABOLIC PANEL - Abnormal; Notable for the following:    CO2 21 (*)    All other components within normal limits  URINALYSIS, ROUTINE W REFLEX MICROSCOPIC (NOT AT [facility]) - Abnormal; Notable for the following:    APPearance HAZY (*)    Ketones, ur 40 (*)    All other components within normal limits  I-STAT CHEM 8, ED - Abnormal; Notable for the following:    Hemoglobin 16.3 (*)    HCT 48.0 (*)    All other components within normal limits  PROTIME-INR  APTT  DIFFERENTIAL  I-STAT TROPOININ, ED    Imaging Review Ct Head (brain) Wo Contrast  10/06/2014   CLINICAL DATA:  Three day history of generalized headache. Slurred speech and facial droop which began yesterday. Patient unable to have MRI due to an indwelling pacemaker.  EXAM: CT HEAD WITHOUT CONTRAST  TECHNIQUE: Contiguous axial images were obtained from the base of the skull through the vertex without intravenous contrast.  COMPARISON:  None.  FINDINGS: Ventricular system normal in size and appearance for age. Mild cortical atrophy consistent with age. Moderate changes of small vessel disease  of the white matter diffusely with more focal low attenuation involving the anterior limb of the right internal capsule. No mass lesion. No midline shift. No acute hemorrhage or hematoma. No extra-axial fluid collections.  No skull fracture or other focal osseous abnormality involving the skull. Visualized paranasal sinuses, bilateral mastoid air cells and bilateral middle ear cavities well-aerated.  IMPRESSION: 1. Age indeterminate but likely subacute or old nonhemorrhagic lacunar stroke involving the anterior limb of the right internal capsule. 2. Mild cortical atrophy and moderate chronic microvascular ischemic changes of the white matter.   Electronically Signed   By: Evangeline Dakin M.D.   On: 10/06/2014 19:32     EKG Interpretation   Date/Time:  Wednesday Oct 06 2014 17:42:50 EDT Ventricular Rate:  88 PR Interval:  202 QRS Duration: 154 QT Interval:  442 QTC Calculation: 534 R Axis:   -86 Text Interpretation:  Atrial-sensed ventricular-paced rhythm Abnormal ECG  Confirmed by Darran Gabay  MD, Grantham Hippert ([id number]) on 10/06/2014 7:12:27 PM      MDM   Final diagnoses:  Stroke  Headache, unspecified headache type    8:36 PM 69 y.o. female w hx of autonomic dysfunction who presents with headache and neurologic complaints. She states that she started developing some blurry vision her right eye about 4 days ago. 3 days ago she sneezed and developed a sudden onset left-sided headache which has been waxing and waning since then. She is also developed some mild weakness in her left lower extremity. Today while being evaluated in her PCPs office around 3:30 pm she began developing some altered sensation to the right side of her face. She has ongoing headache for about a 10. Vital signs  are unremarkable here. CT scan concerning for subacute stroke. We'll treat headache with migraine cocktail and consult neurology and admitted to the hospitalist.    Pamella Pert, MD 10/07/14 [id number]

## 2014-10-06 NOTE — ED Notes (Signed)
Neurologist Dr. Doy Mince at bedside

## 2014-10-06 NOTE — ED Notes (Signed)
Pt from MRI was unable to have MRI due to pacemaker presence; pt with slurred speech and facial droop starting yesterday; pt sts left sided HA starting on Sunday; pt sts some dizziness

## 2014-10-06 NOTE — ED Notes (Signed)
Pt reports left side HA with photophobia starting Sunday morning. Pt was seen by PCP today, PCP was concerned about pt's right side facial droop and left eye swelling. Family at bedside reports left eye droop starting yesterday and right mouth droop starting around 1500, slurred speech starting around 1600.

## 2014-10-06 NOTE — Consult Note (Addendum)
Referring Physician: Hal Hope    Chief Complaint: Left sided headache  HPI: [name] is an 69 y.o. female with multiple medical problems who reports that she awakened at baseline on 10/03/14. Not long after awakening she sneezed and had the onset of sharp pain form her left temple. He headache continued throughout the day but improved enough that she was able to go to sleep that evening. When she awakened on the 23rd her headache persisted. She felt some swelling on the left side of her face as well. She made an appointment to see her eye doctor on the 24th. When seeing her eye doctor he reported that her eyes were normal. He noted that her left eye was ptotic and that she was experiencing monocular right eye diplopia. Today she saw Dr. [name] who was concerned about her facial asymmetry. A MRI was ordered but when unable to be performed it was suggested that she be seen in the ED. In the ED the patient began to experience left sided weakness. She had not experienced that prior to being evaluated in the ED.   Date last known well: Date: 10/03/2014 Time last known well: Time: 09:00 tPA Given: No: Outside time window  Past Medical History  Diagnosis Date  . Diverticulosis   . GERD (gastroesophageal reflux disease)   . Esophageal stricture   . Hemorrhoids   . Mallory - Weiss tear     HEALED   . Interstitial cystitis   . ADHD (attention deficit hyperactivity disorder)   . Breast cancer     right side  . Blood transfusion   . Thyroid disease   . Allergy     trees/pollen, mold, fungus, dust mites. Takes allergy shots  . Latex allergy, contact dermatitis   . Cataract   . Depression   . Hernia of abdominal wall     spigelian hernia RLQ - SURGERY TO REPAIR  . H/O hiatal hernia   . Neuromuscular disorder     central nervous system neuropathy- seen per Dr Erling Cruz  . Asthma     allergist Dr Olena Heckle- monthly allergy injections  . Complication of anesthesia     reaction to  some anesthetics/ 7/12 anesth record on chart- states prefers epidural  . PONV (postoperative nausea and vomiting)     pt needs scop patch  . Recurrent upper respiratory infection (URI) 1/13- to present    bronchitis following surgery- states improved but still with cough. OV with [facility] Dr [name] 09/06/11 on chart  . Symptomatic bradycardia     a. 12/2012 s/p MDT dual chamber PPM, ser # [id number]; b. 12/2012 post-op course complicated by pericardial effusinon req lead revision.  . Pericardial effusion     a. 12/2012 following ppm placement;  b. 01/01/2013 Echo: EF 55-60%, small pericardial effusion w/o RV collapse-->No need for tap/window.  . Chest pain     a. 12/2012 Cath: nl cors, EF 55-65%.  . Autonomic dysfunction     CENTRAL NERVOUS SYSTEM NEUROPATHY - DX BY DR. Erling Cruz MORE THAN 10 YRS AGO - AND IT IS FELT TO CONTRIBUTE TO THE AUTOMIC  DYSFUNCTION-- PT HAS NUMBNESS LEGS AND FEET AND SOMETIIMES TIPS OF FINGER, SEVERE CONSTIPATION( NO SENSATION TO HAVE BM ), DOUBLE VISION, ORTHOSTATIC HYPOTENSION  . Hypothyroidism   . Bruises easily   . Pacemaker   . Dysrhythmia     HX OF HIGH GRADE HEART BLOCK - REQUIRED PACEMAKER INSERTION  . Shortness of breath     AT TIMES -  BUT MUCH IMPROVED AFTER PACEMAKER WAS REPROGRAMED.  . Arthritis     PAIN AND OA LEFT HIP    Past Surgical History  Procedure Laterality Date  . Mastectomy modified radical      right; with immediate reconstruction  . Laparoscopy  1973  . Abdominal hysterectomy  1974  . Cystoscopy  1975, 2007  . Myringoplasty  1962  . Tympanoplasty  1973    right  . Oophorectomy  1982  . Bladder suspension    . Rectocele repair      with cystocele repair  . Total hip arthroplasty Right   . Ventral hernia repair  05/30/2011    Procedure: HERNIA REPAIR VENTRAL ADULT;  Surgeon: Haywood Lasso, MD;  Location: [facility];  Service: General;  Laterality: Right;  repair right spigelian hernia  . Breast biopsy  2002    NO  BLOOD PRESSURES ON RIGHT SIDE/   s/p  axillary node dissection  . Tonsillectomy    . Back surgery      cervical fusion 4-5 with plate  . Cysto with hydrodistension  09/07/2011    Procedure: CYSTOSCOPY/HYDRODISTENSION;  Surgeon: Ailene Rud, MD;  Location: [facility];  Service: Urology;  Laterality: N/A;  INSTILLATION OF MARCAINE/PYRIDIUM INSTILLATION OF MARCAINE/KENALOG  . Pacemaker insertion    . Eye surgery      LASIK EYE SURGERY BILATERAL  . Ulnar nerve transposition Right   . Total hip arthroplasty Left 06/12/2013    Procedure: LEFT TOTAL HIP ARTHROPLASTY ANTERIOR APPROACH;  Surgeon: Mcarthur Rossetti, MD;  Location: [facility];  Service: Orthopedics;  Laterality: Left;  . Permanent pacemaker insertion N/A 12/29/2012    Procedure: PERMANENT PACEMAKER INSERTION;  Surgeon: Deboraha Sprang, MD;  Location: [facility] C[facility] LAB;  Service: Cardiovascular;  Laterality: N/A;  . Left heart catheterization with coronary angiogram N/A 12/29/2012    Procedure: LEFT HEART CATHETERIZATION WITH CORONARY ANGIOGRAM;  Surgeon: Peter M Martinique, MD;  Location: [facility] CATH LAB;  Service: Cardiovascular;  Laterality: N/A;  . Lead revision N/A 12/30/2012    Procedure: LEAD REVISION;  Surgeon: Evans Lance, MD;  Location: [facility] CATH LAB;  Service: Cardiovascular;  Laterality: N/A;    Family History  Problem Relation Age of Onset  . Heart disease Father   . Diabetes      aunt  . Uterine cancer      grandmother  . Colon cancer Mother   . Depression Mother   . Cancer Mother     colon  . Pancreatic cancer Sister   . Cancer Sister     pancreatic  . Cancer Maternal Grandmother     ovarian   Social History:  reports that she has never smoked. She has never used smokeless tobacco. She reports that she drinks about 4.2 oz of alcohol per week. She reports that she does not use illicit drugs.  Allergies:  Allergies  Allergen Reactions  . Anesthetics, Amide Other (See Comments)    Patient unsure of names, however  multiples cause swelling of airway & nausea  . Sulfonamide Derivatives Anaphylaxis and Swelling  . Azithromycin Other (See Comments)    Severe gastritis   . Bactroban Other (See Comments)    Causes sores in nose  . Ciprofloxacin     REACTION: joint swelling  . Codeine Swelling  . Meperidine Hcl Nausea Only    Hallucinations   . Morphine Nausea Only    Hallucinations   . Neosporin [Neomycin-Polymyxin-Gramicidin] Hives and Dermatitis  All topical "orin's ointment"  . Nitrofurantoin     REACTION: neuropathy in legs  . Other     ALLERGIC TO WARM WATER SHELLFISH  PT STATES PLEASE NOTE SHE CAN TAKE OXYCODONE AND TYLENOL FOR PAIN -NOT ALLERGIC  . Penicillins Other (See Comments)    Swelling in joints  . Tramadol     Makes jerk  . Latex Rash    Medications:  Current outpatient prescriptions:  . albuterol (PROVENTIL HFA;VENTOLIN HFA) 108 (90 BASE) MCG/ACT inhaler, Inhale 2 puffs into the lungs every 6 (six) hours as needed. For shortness of breath, Disp: , Rfl:  . ALPHA LIPOIC ACID PO, Take 200 mg by mouth daily., Disp: , Rfl:  . b complex vitamins capsule, Take 1 capsule by mouth daily. , Disp: , Rfl:  . cholecalciferol (VITAMIN D) 1000 UNITS tablet, Take 1,000 Units by mouth daily., Disp: , Rfl:  . ciclesonide (OMNARIS) 50 MCG/ACT nasal spray, Place 2 sprays into both nostrils as needed. , Disp: , Rfl:  . cyanocobalamin 2000 MCG tablet, Take 2,000 mcg by mouth daily., Disp: , Rfl:  . docusate sodium (COLACE) 100 MG capsule, Take 200 mg by mouth every evening. , Disp: , Rfl:  . DULoxetine (CYMBALTA) 60 MG capsule, Take 60 mg by mouth every morning., Disp: , Rfl:  . esomeprazole (NEXIUM) 40 MG capsule, Take 20 mg by mouth daily. , Disp: , Rfl:  . estradiol (ESTRING) 2 MG vaginal ring, Place 2 mg vaginally every 3 (three) months. follow package directions, Disp: , Rfl:  . estradiol (VIVELLE-DOT) 0.025 MG/24HR, Place 1 patch onto the skin 2 (two) times a week.  Applies new patch on Sunday & Thursday, Disp: , Rfl:  . fexofenadine (ALLEGRA) 180 MG tablet, Take 180 mg by mouth daily. , Disp: , Rfl:  . fish oil-omega-3 fatty acids 1000 MG capsule, Take 1 g by mouth daily. , Disp: , Rfl:  . levothyroxine (SYNTHROID, LEVOTHROID) 112 MCG tablet, Take 112 mcg by mouth every morning. , Disp: , Rfl:  . magnesium oxide (MAG-OX) 400 MG tablet, Take 400 mg by mouth daily., Disp: , Rfl:  . midodrine (PROAMATINE) 2.5 MG tablet, TAKE 1 TABLET THREE TIMES A DAY WITH MEALS, Disp: 90 tablet, Rfl: 6 . montelukast (SINGULAIR) 10 MG tablet, Take 10 mg by mouth as needed (allergies). , Disp: , Rfl:  . potassium chloride (KLOR-CON M10) 10 MEQ tablet, Take 1 tablet (10 mEq total) by mouth daily., Disp: 30 tablet, Rfl: 6 . PRESCRIPTION MEDICATION, ALLERGY SHOT 2 TIMES A WEEK BY DR. Neldon Mc, Disp: , Rfl:  . Probiotic Product (ALIGN) 4 MG CAPS, Take 4 mg by mouth at bedtime. , Disp: , Rfl:  . atenolol (TENORMIN) 25 MG tablet, TAKE 1 TABLET EVERY OTHER DAY (Patient not taking: Reported on 10/06/2014), Disp: 45 tablet, Rfl: 8  ROS: History obtained from the patient  General ROS: negative for - chills, fatigue, fever, night sweats, weight gain or weight loss Psychological ROS: negative for - behavioral disorder, hallucinations, memory difficulties, mood swings or suicidal ideation Ophthalmic ROS: as noted in HPI ENT ROS: negative for - epistaxis, nasal discharge, oral lesions, sore throat, tinnitus or vertigo Allergy and Immunology ROS: negative for - hives or itchy/watery eyes Hematological and Lymphatic ROS: negative for - bleeding problems, bruising or swollen lymph nodes Endocrine ROS: negative for - galactorrhea, hair pattern changes, polydipsia/polyuria or temperature intolerance Respiratory ROS: negative for - cough, hemoptysis, shortness of breath or wheezing Cardiovascular ROS: negative for - chest pain, dyspnea  on exertion, edema or irregular  heartbeat Gastrointestinal ROS: negative for - abdominal pain, diarrhea, hematemesis, nausea/vomiting or stool incontinence Genito-Urinary ROS: negative for - dysuria, hematuria, incontinence or urinary frequency/urgency Musculoskeletal ROS: negative for - joint swelling or muscular weakness Neurological ROS: as noted in HPI Dermatological ROS: negative for rash and skin lesion changes  Physical Examination: Blood pressure 146/72, pulse 74, temperature 98 F (36.7 C), temperature source Oral, resp. rate 20, SpO2 100 %.  HEENT- Normocephalic, no lesions, without obvious abnormality. Normal external eye and conjunctiva. Normal TM's bilaterally. Normal auditory canals and external ears. Normal external nose, mucus membranes and septum. Normal pharynx. Cardiovascular- S1, S2 normal, pulses palpable throughout  Lungs- chest clear, no wheezing, rales, normal symmetric air entry Abdomen- soft, non-tender; bowel sounds normal; no masses, no organomegaly Extremities- no edema Lymph-no adenopathy palpable Musculoskeletal-no joint tenderness, deformity or swelling Skin-warm and dry, no hyperpigmentation, vitiligo, or suspicious lesions  Neurological Examination Mental Status: Alert, oriented, thought content appropriate. Speech fluent without evidence of aphasia. Able to follow 3 step commands without difficulty. Cranial Nerves: II: Discs flat bilaterally; Visual fields grossly normal, pupils equal, round, reactive to light and accommodation III,IV, VI: ptosis not present, extra-ocular motions intact with coarse nystagmus on left lateral gaze as if unable to hold gaze. Reports right monocular diplopia that worsens with both eyes open. V,VII: decreased right NLF with left hemifacial spasm noted, facial light touch sensation decreased on the left VIII: hearing normal bilaterally IX,X: gag reflex present XI: bilateral shoulder shrug XII: midline tongue extension Motor: Right :Upper  extremity 5/5Left: Upper extremity 5-/5 with drift but no pronation.  Lower extremity 5/5Lower extremity 5-/5 Tone and bulk:normal tone throughout; no atrophy noted Sensory: Pinprick and light touch intact throughout, bilaterally Deep Tendon Reflexes: 2+ and symmetric throughout Plantars: Right: downgoingLeft: downgoing Cerebellar: normal finger-to-nose and normal heel-to-shin testing bilaterally Gait: not tested due to safety concerns   Laboratory Studies:  Basic Metabolic Panel:  Recent Labs Lab 10/06/14 1742 10/06/14 1821  NA 136 138  K 3.8 3.9  CL 103 101  CO2 21*  --   GLUCOSE 88 84  BUN 15 19  CREATININE 0.89 0.80  CALCIUM 9.4  --     Liver Function Tests:  Recent Labs Lab 10/06/14 1742  AST 26  ALT 21  ALKPHOS 50  BILITOT 0.4  PROT 7.4  ALBUMIN 4.5   No results for input(s): LIPASE, AMYLASE in the last 168 hours. No results for input(s): AMMONIA in the last 168 hours.  CBC:  Recent Labs Lab 10/06/14 1742 10/06/14 1821  WBC 6.8  --   NEUTROABS 3.7  --   HGB 15.1* 16.3*  HCT 44.3 48.0*  MCV 92.3  --   PLT 234  --     Cardiac Enzymes: No results for input(s): CKTOTAL, CKMB, CKMBINDEX, TROPONINI in the last 168 hours.  BNP: Invalid input(s): POCBNP  CBG: No results for input(s): GLUCAP in the last 168 hours.  Microbiology: Results for orders placed or performed during the hospital encounter of 06/08/13  Surgical pcr screen     Status: None   Collection Time: 06/08/13  2:16 PM  Result Value Ref Range Status   MRSA, PCR NEGATIVE NEGATIVE Final   Staphylococcus aureus NEGATIVE NEGATIVE Final    Comment:        The Xpert SA Assay (FDA approved for NASAL specimens in patients over 57 years of age), is one component of a comprehensive surveillance program.  Test performance has been  validated by  [facility] for patients greater than or equal to 64 year old. It is not intended to diagnose infection nor to guide or monitor treatment.    Coagulation Studies:  Recent Labs  10/06/14 1742  LABPROT 14.0  INR 1.06    Urinalysis:  Recent Labs Lab 10/06/14 1930  COLORURINE YELLOW  LABSPEC 1.019  PHURINE 6.5  GLUCOSEU NEGATIVE  HGBUR NEGATIVE  BILIRUBINUR NEGATIVE  KETONESUR 40*  PROTEINUR NEGATIVE  UROBILINOGEN 0.2  NITRITE NEGATIVE  LEUKOCYTESUR NEGATIVE    Lipid Panel: No results found for: CHOL, TRIG, HDL, CHOLHDL, VLDL, LDLCALC  HgbA1C: No results found for: HGBA1C  Urine Drug Screen:  No results found for: LABOPIA, COCAINSCRNUR, LABBENZ, AMPHETMU, THCU, LABBARB  Alcohol Level: No results for input(s): ETH in the last 168 hours.  Other results: EKG: atrial sensed ventricular paced rhythm, 88bpm.  Imaging: Ct Head (brain) Wo Contrast  10/06/2014   CLINICAL DATA:  Three day history of generalized headache. Slurred speech and facial droop which began yesterday. Patient unable to have MRI due to an indwelling pacemaker.  EXAM: CT HEAD WITHOUT CONTRAST  TECHNIQUE: Contiguous axial images were obtained from the base of the skull through the vertex without intravenous contrast.  COMPARISON:  None.  FINDINGS: Ventricular system normal in size and appearance for age. Mild cortical atrophy consistent with age. Moderate changes of small vessel disease of the white matter diffusely with more focal low attenuation involving the anterior limb of the right internal capsule. No mass lesion. No midline shift. No acute hemorrhage or hematoma. No extra-axial fluid collections.  No skull fracture or other focal osseous abnormality involving the skull. Visualized paranasal sinuses, bilateral mastoid air cells and bilateral middle ear cavities well-aerated.  IMPRESSION: 1. Age indeterminate but likely subacute or old nonhemorrhagic lacunar stroke involving the anterior limb of the right  internal capsule. 2. Mild cortical atrophy and moderate chronic microvascular ischemic changes of the white matter.   Electronically Signed   By: Evangeline Dakin M.D.   On: 10/06/2014 19:32    Assessment: 69 y.o. female presenting with incremental increase in symptoms starting with a left sided headache on 5/22. Current neurological exam currently with left sided deficits. Head CT reviewed and shows a right internal capsular area of hypodensity possibly representing a subacute infarct. Further work up recommended. Patient on no antiplatelet therapy  Stroke Risk Factors - none  Plan: 1. HgbA1c, fasting lipid panel, ESR, CRP 2. Tegretol 250m BID 3. PT consult, OT consult, Speech consult 4. Echocardiogram 5. Carotid dopplers 6. Prophylactic therapy-Antiplatelet med: Aspirin - dose 3226mdaily 7. NPO until RN stroke swallow screen 8. Telemetry monitoring 9. Frequent neuro checks   LeAlexis GoodellMD [facility] [phone]/25/2016, 10:21 PM

## 2014-10-06 NOTE — ED Notes (Signed)
Patient transported to CT 

## 2014-10-06 NOTE — ED Notes (Signed)
Dr. Kakrakandy at bedside. 

## 2014-10-07 ENCOUNTER — Inpatient Hospital Stay (HOSPITAL_COMMUNITY): Payer: Medicare Other

## 2014-10-07 ENCOUNTER — Other Ambulatory Visit (HOSPITAL_COMMUNITY): Payer: TRICARE For Life (TFL)

## 2014-10-07 ENCOUNTER — Encounter (HOSPITAL_COMMUNITY): Payer: Self-pay | Admitting: Internal Medicine

## 2014-10-07 DIAGNOSIS — I6782 Cerebral ischemia: Secondary | ICD-10-CM | POA: Diagnosis not present

## 2014-10-07 DIAGNOSIS — E785 Hyperlipidemia, unspecified: Secondary | ICD-10-CM | POA: Diagnosis not present

## 2014-10-07 DIAGNOSIS — Z91040 Latex allergy status: Secondary | ICD-10-CM | POA: Diagnosis not present

## 2014-10-07 DIAGNOSIS — G513 Clonic hemifacial spasm: Secondary | ICD-10-CM | POA: Diagnosis present

## 2014-10-07 DIAGNOSIS — H532 Diplopia: Secondary | ICD-10-CM | POA: Diagnosis not present

## 2014-10-07 DIAGNOSIS — J45909 Unspecified asthma, uncomplicated: Secondary | ICD-10-CM | POA: Diagnosis present

## 2014-10-07 DIAGNOSIS — G43109 Migraine with aura, not intractable, without status migrainosus: Secondary | ICD-10-CM | POA: Diagnosis present

## 2014-10-07 DIAGNOSIS — I639 Cerebral infarction, unspecified: Secondary | ICD-10-CM

## 2014-10-07 DIAGNOSIS — R51 Headache: Secondary | ICD-10-CM

## 2014-10-07 DIAGNOSIS — M5481 Occipital neuralgia: Secondary | ICD-10-CM | POA: Diagnosis not present

## 2014-10-07 DIAGNOSIS — Z7982 Long term (current) use of aspirin: Secondary | ICD-10-CM | POA: Diagnosis not present

## 2014-10-07 DIAGNOSIS — Z950 Presence of cardiac pacemaker: Secondary | ICD-10-CM | POA: Diagnosis not present

## 2014-10-07 DIAGNOSIS — K529 Noninfective gastroenteritis and colitis, unspecified: Secondary | ICD-10-CM | POA: Diagnosis present

## 2014-10-07 DIAGNOSIS — I6789 Other cerebrovascular disease: Secondary | ICD-10-CM

## 2014-10-07 DIAGNOSIS — J984 Other disorders of lung: Secondary | ICD-10-CM | POA: Diagnosis not present

## 2014-10-07 DIAGNOSIS — Z881 Allergy status to other antibiotic agents status: Secondary | ICD-10-CM | POA: Diagnosis not present

## 2014-10-07 DIAGNOSIS — Z9011 Acquired absence of right breast and nipple: Secondary | ICD-10-CM | POA: Diagnosis present

## 2014-10-07 DIAGNOSIS — G629 Polyneuropathy, unspecified: Secondary | ICD-10-CM | POA: Diagnosis present

## 2014-10-07 DIAGNOSIS — Z853 Personal history of malignant neoplasm of breast: Secondary | ICD-10-CM | POA: Diagnosis not present

## 2014-10-07 DIAGNOSIS — E039 Hypothyroidism, unspecified: Secondary | ICD-10-CM

## 2014-10-07 DIAGNOSIS — I10 Essential (primary) hypertension: Secondary | ICD-10-CM | POA: Diagnosis present

## 2014-10-07 DIAGNOSIS — F909 Attention-deficit hyperactivity disorder, unspecified type: Secondary | ICD-10-CM | POA: Diagnosis present

## 2014-10-07 DIAGNOSIS — Z96643 Presence of artificial hip joint, bilateral: Secondary | ICD-10-CM | POA: Diagnosis present

## 2014-10-07 DIAGNOSIS — R29810 Facial weakness: Secondary | ICD-10-CM | POA: Diagnosis present

## 2014-10-07 DIAGNOSIS — Z882 Allergy status to sulfonamides status: Secondary | ICD-10-CM | POA: Diagnosis not present

## 2014-10-07 DIAGNOSIS — G319 Degenerative disease of nervous system, unspecified: Secondary | ICD-10-CM | POA: Diagnosis not present

## 2014-10-07 DIAGNOSIS — Z880 Allergy status to penicillin: Secondary | ICD-10-CM | POA: Diagnosis not present

## 2014-10-07 DIAGNOSIS — R519 Headache, unspecified: Secondary | ICD-10-CM | POA: Insufficient documentation

## 2014-10-07 DIAGNOSIS — C50911 Malignant neoplasm of unspecified site of right female breast: Secondary | ICD-10-CM | POA: Diagnosis not present

## 2014-10-07 DIAGNOSIS — H578 Other specified disorders of eye and adnexa: Secondary | ICD-10-CM | POA: Diagnosis present

## 2014-10-07 DIAGNOSIS — Z885 Allergy status to narcotic agent status: Secondary | ICD-10-CM | POA: Diagnosis not present

## 2014-10-07 DIAGNOSIS — G43909 Migraine, unspecified, not intractable, without status migrainosus: Secondary | ICD-10-CM | POA: Diagnosis not present

## 2014-10-07 DIAGNOSIS — I951 Orthostatic hypotension: Secondary | ICD-10-CM | POA: Diagnosis present

## 2014-10-07 DIAGNOSIS — Z884 Allergy status to anesthetic agent status: Secondary | ICD-10-CM | POA: Diagnosis not present

## 2014-10-07 DIAGNOSIS — Z886 Allergy status to analgesic agent status: Secondary | ICD-10-CM | POA: Diagnosis not present

## 2014-10-07 DIAGNOSIS — Z90710 Acquired absence of both cervix and uterus: Secondary | ICD-10-CM | POA: Diagnosis not present

## 2014-10-07 DIAGNOSIS — K219 Gastro-esophageal reflux disease without esophagitis: Secondary | ICD-10-CM | POA: Diagnosis present

## 2014-10-07 LAB — CBC WITH DIFFERENTIAL/PLATELET
Basophils Absolute: 0 10*3/uL (ref 0.0–0.1)
Basophils Relative: 0 % (ref 0–1)
Eosinophils Absolute: 0.1 10*3/uL (ref 0.0–0.7)
Eosinophils Relative: 2 % (ref 0–5)
HCT: 37.9 % (ref 36.0–46.0)
HEMOGLOBIN: 12.9 g/dL (ref 12.0–15.0)
LYMPHS ABS: 2.3 10*3/uL (ref 0.7–4.0)
Lymphocytes Relative: 52 % — ABNORMAL HIGH (ref 12–46)
MCH: 31 pg (ref 26.0–34.0)
MCHC: 34 g/dL (ref 30.0–36.0)
MCV: 91.1 fL (ref 78.0–100.0)
MONOS PCT: 9 % (ref 3–12)
Monocytes Absolute: 0.4 10*3/uL (ref 0.1–1.0)
NEUTROS ABS: 1.7 10*3/uL (ref 1.7–7.7)
Neutrophils Relative %: 37 % — ABNORMAL LOW (ref 43–77)
PLATELETS: 191 10*3/uL (ref 150–400)
RBC: 4.16 MIL/uL (ref 3.87–5.11)
RDW: 13.1 % (ref 11.5–15.5)
WBC: 4.5 10*3/uL (ref 4.0–10.5)

## 2014-10-07 LAB — COMPREHENSIVE METABOLIC PANEL
ALBUMIN: 3.5 g/dL (ref 3.5–5.0)
ALK PHOS: 35 U/L — AB (ref 38–126)
ALT: 17 U/L (ref 14–54)
ANION GAP: 6 (ref 5–15)
AST: 21 U/L (ref 15–41)
BUN: 11 mg/dL (ref 6–20)
CHLORIDE: 108 mmol/L (ref 101–111)
CO2: 22 mmol/L (ref 22–32)
Calcium: 8.5 mg/dL — ABNORMAL LOW (ref 8.9–10.3)
Creatinine, Ser: 0.67 mg/dL (ref 0.44–1.00)
GFR calc non Af Amer: >60 mL/min (ref >60)
Glucose, Bld: 84 mg/dL (ref 65–99)
Potassium: 3.6 mmol/L (ref 3.5–5.1)
Sodium: 136 mmol/L (ref 135–145)
TOTAL PROTEIN: 6 g/dL — AB (ref 6.5–8.1)
Total Bilirubin: 0.3 mg/dL (ref 0.3–1.2)

## 2014-10-07 LAB — LIPID PANEL
Cholesterol: 169 mg/dL (ref 0–200)
HDL: 45 mg/dL (ref >40)
LDL CALC: 114 mg/dL — AB (ref 0–99)
Total CHOL/HDL Ratio: 3.8 RATIO
Triglycerides: 50 mg/dL (ref <150)
VLDL: 10 mg/dL (ref 0–40)

## 2014-10-07 LAB — SEDIMENTATION RATE: Sed Rate: 2 mm/hr (ref 0–22)

## 2014-10-07 LAB — C-REACTIVE PROTEIN: CRP: <0.5 mg/dL (ref <1.0)

## 2014-10-07 MED ORDER — OMEGA-3 FATTY ACIDS 1000 MG PO CAPS: 1.0000 g | ORAL_CAPSULE | Freq: Every day | ORAL | Status: DC

## 2014-10-07 MED ORDER — MAGNESIUM OXIDE 400 (241.3 MG) MG PO TABS
400.0000 mg | ORAL_TABLET | Freq: Every day | ORAL | Status: DC
  Administered 2014-10-07 – 2014-10-08 (×2): 400 mg via ORAL
  Filled 2014-10-07 (×4): qty 1

## 2014-10-07 MED ORDER — LEVOTHYROXINE SODIUM 112 MCG PO TABS
112.0000 ug | ORAL_TABLET | Freq: Every day | ORAL | Status: DC
  Administered 2014-10-07 – 2014-10-08 (×2): 112 ug via ORAL
  Filled 2014-10-07 (×2): qty 1

## 2014-10-07 MED ORDER — SODIUM CHLORIDE 0.9 % IV SOLN
INTRAVENOUS | Status: DC
  Administered 2014-10-07: 01:00:00 via INTRAVENOUS

## 2014-10-07 MED ORDER — ALPHA LIPOIC ACID 200 MG PO CAPS: 200.0000 mg | ORAL_CAPSULE | Freq: Every day | ORAL | Status: DC

## 2014-10-07 MED ORDER — STROKE: EARLY STAGES OF RECOVERY BOOK
Freq: Once | Status: AC
  Administered 2014-10-07: 01:00:00

## 2014-10-07 MED ORDER — VITAMIN D 1000 UNITS PO TABS
1000.0000 [IU] | ORAL_TABLET | Freq: Every day | ORAL | Status: DC
  Administered 2014-10-07 – 2014-10-08 (×2): 1000 [IU] via ORAL
  Filled 2014-10-07 (×2): qty 1

## 2014-10-07 MED ORDER — MIDODRINE HCL 5 MG PO TABS
2.5000 mg | ORAL_TABLET | Freq: Three times a day (TID) | ORAL | Status: DC
  Administered 2014-10-07: 2.5 mg via ORAL
  Filled 2014-10-07: qty 1

## 2014-10-07 MED ORDER — MONTELUKAST SODIUM 10 MG PO TABS: 10.0000 mg | ORAL_TABLET | ORAL | Status: DC | PRN

## 2014-10-07 MED ORDER — ACETAMINOPHEN 325 MG PO TABS
650.0000 mg | ORAL_TABLET | Freq: Four times a day (QID) | ORAL | Status: DC | PRN
  Administered 2014-10-07 – 2014-10-08 (×2): 650 mg via ORAL
  Filled 2014-10-07 (×2): qty 2

## 2014-10-07 MED ORDER — BUDESONIDE 3 MG PO CPEP
3.0000 mg | ORAL_CAPSULE | Freq: Every day | ORAL | Status: DC
  Administered 2014-10-07 – 2014-10-08 (×2): 3 mg via ORAL
  Filled 2014-10-07 (×2): qty 1

## 2014-10-07 MED ORDER — ENOXAPARIN SODIUM 40 MG/0.4ML ~~LOC~~ SOLN
40.0000 mg | SUBCUTANEOUS | Status: DC
  Administered 2014-10-07 – 2014-10-08 (×2): 40 mg via SUBCUTANEOUS
  Filled 2014-10-07 (×2): qty 0.4

## 2014-10-07 MED ORDER — LORATADINE 10 MG PO TABS
10.0000 mg | ORAL_TABLET | Freq: Every day | ORAL | Status: DC
  Administered 2014-10-07 – 2014-10-08 (×2): 10 mg via ORAL
  Filled 2014-10-07 (×2): qty 1

## 2014-10-07 MED ORDER — IOHEXOL 350 MG/ML SOLN
50.0000 mL | Freq: Once | INTRAVENOUS | Status: AC | PRN
  Administered 2014-10-07: 50 mL via INTRAVENOUS

## 2014-10-07 MED ORDER — FLUTICASONE PROPIONATE 50 MCG/ACT NA SUSP
2.0000 | Freq: Every day | NASAL | Status: DC
  Filled 2014-10-07: qty 16

## 2014-10-07 MED ORDER — OMEGA-3-ACID ETHYL ESTERS 1 G PO CAPS
1.0000 g | ORAL_CAPSULE | Freq: Every day | ORAL | Status: DC
  Administered 2014-10-07 – 2014-10-08 (×2): 1 g via ORAL
  Filled 2014-10-07 (×2): qty 1

## 2014-10-07 MED ORDER — VITAMIN B-12 1000 MCG PO TABS
2000.0000 ug | ORAL_TABLET | Freq: Every day | ORAL | Status: DC
  Administered 2014-10-07 – 2014-10-08 (×2): 2000 ug via ORAL
  Filled 2014-10-07 (×2): qty 2

## 2014-10-07 MED ORDER — POTASSIUM CHLORIDE CRYS ER 10 MEQ PO TBCR
10.0000 meq | EXTENDED_RELEASE_TABLET | Freq: Every day | ORAL | Status: DC
  Administered 2014-10-07 – 2014-10-08 (×2): 10 meq via ORAL
  Filled 2014-10-07 (×2): qty 1

## 2014-10-07 MED ORDER — CARBAMAZEPINE 200 MG PO TABS
200.0000 mg | ORAL_TABLET | Freq: Two times a day (BID) | ORAL | Status: DC
  Administered 2014-10-07 (×3): 200 mg via ORAL
  Filled 2014-10-07 (×4): qty 1

## 2014-10-07 MED ORDER — ALBUTEROL SULFATE (2.5 MG/3ML) 0.083% IN NEBU: 2.5000 mg | INHALATION_SOLUTION | Freq: Four times a day (QID) | RESPIRATORY_TRACT | Status: DC | PRN

## 2014-10-07 MED ORDER — ASPIRIN 300 MG RE SUPP: 300.0000 mg | Freq: Every day | RECTAL | Status: DC

## 2014-10-07 MED ORDER — PANTOPRAZOLE SODIUM 40 MG PO TBEC
40.0000 mg | DELAYED_RELEASE_TABLET | Freq: Every day | ORAL | Status: DC
  Administered 2014-10-07 – 2014-10-08 (×2): 40 mg via ORAL
  Filled 2014-10-07 (×2): qty 1

## 2014-10-07 MED ORDER — DULOXETINE HCL 60 MG PO CPEP
60.0000 mg | ORAL_CAPSULE | Freq: Every morning | ORAL | Status: DC
  Administered 2014-10-07 – 2014-10-08 (×2): 60 mg via ORAL
  Filled 2014-10-07 (×2): qty 1

## 2014-10-07 MED ORDER — SENNOSIDES-DOCUSATE SODIUM 8.6-50 MG PO TABS: 1.0000 | ORAL_TABLET | Freq: Every evening | ORAL | Status: DC | PRN

## 2014-10-07 MED ORDER — ASPIRIN 325 MG PO TABS
325.0000 mg | ORAL_TABLET | Freq: Every day | ORAL | Status: DC
  Administered 2014-10-07 – 2014-10-08 (×2): 325 mg via ORAL
  Filled 2014-10-07 (×2): qty 1

## 2014-10-07 MED ORDER — DOCUSATE SODIUM 100 MG PO CAPS
200.0000 mg | ORAL_CAPSULE | Freq: Every evening | ORAL | Status: DC
  Administered 2014-10-07: 200 mg via ORAL
  Filled 2014-10-07 (×2): qty 2

## 2014-10-07 MED ORDER — SODIUM CHLORIDE 0.9 % IV SOLN
INTRAVENOUS | Status: DC
  Administered 2014-10-07: 12:00:00 via INTRAVENOUS

## 2014-10-07 MED ORDER — MIDODRINE HCL 5 MG PO TABS
10.0000 mg | ORAL_TABLET | Freq: Three times a day (TID) | ORAL | Status: DC
  Administered 2014-10-07 – 2014-10-08 (×4): 10 mg via ORAL
  Filled 2014-10-07 (×4): qty 2

## 2014-10-07 NOTE — Progress Notes (Signed)
[facility] PROGRESS NOTE  [name] LFY:[id number] DOB: 1945-11-15 DOA: 10/06/2014 PCP: Geoffery Lyons, MD  Assessment/Plan: 1. Facial weakness, headaches,  Work up for stroke in process.  Will order MRI, MRI department will review if patient can have MRI done.  CTA head and neck ordered by neuro.  ECHO pending.  LDL 114, might need statins.  Hb-A1c pending.  ESR 2 and CRP 0.5  Started on Carbamazepine  By neuro for headaches, spasm.   2-Orthostatic hypotension:  Per patient midodrine dose is 10 mg TID. I have increase dose.   3-Microscopy colitis: Continue with Budesonide.   4-Hypothyroidism:  Continue with synthroid.     Code Status: Full Code Family Communication: care discussed with husband who was at bedside.  Disposition Plan: home in 24 hours.    Consultants:  neurology  Procedures:  ECHO; pending.   Carotid doppler: cancelled   Antibiotics:  none  HPI/Subjective: Relates headaches is better, her speech is better.   Objective: Filed Vitals:   10/07/14 0948  BP: 116/65  Pulse: 77  Temp: 98.6 F (37 C)  Resp: 18    Intake/Output Summary (Last 24 hours) at 10/07/14 1126 Last data filed at 10/06/14 2224  Gross per 24 hour  Intake   1000 ml  Output      0 ml  Net   1000 ml   Filed Weights   10/06/14 2255  Weight: 66.225 kg (146 lb)    Exam:   General:  NAD  Cardiovascular: S 1, S 2 RRR  Respiratory: CTA  Abdomen: BS present. Soft nt  Musculoskeletal: no edema  Neuro: left eye with ptopsis, speech better per patient, motor strength 5/5/right facial droop/  Data Reviewed: Basic Metabolic Panel:  Recent Labs Lab 10/06/14 1742 10/06/14 1821 10/07/14 0544  NA 136 138 136  K 3.8 3.9 3.6  CL 103 101 108  CO2 21*  --  22  GLUCOSE 88 84 84  BUN '15 19 11  ' CREATININE 0.89 0.80 0.67  CALCIUM 9.4  --  8.5*   Liver Function Tests:  Recent Labs Lab 10/06/14 1742 10/07/14 0544  AST 26 21  ALT 21 17   ALKPHOS 50 35*  BILITOT 0.4 0.3  PROT 7.4 6.0*  ALBUMIN 4.5 3.5   No results for input(s): LIPASE, AMYLASE in the last 168 hours. No results for input(s): AMMONIA in the last 168 hours. CBC:  Recent Labs Lab 10/06/14 1742 10/06/14 1821 10/07/14 0544  WBC 6.8  --  4.5  NEUTROABS 3.7  --  1.7  HGB 15.1* 16.3* 12.9  HCT 44.3 48.0* 37.9  MCV 92.3  --  91.1  PLT 234  --  191   Cardiac Enzymes: No results for input(s): CKTOTAL, CKMB, CKMBINDEX, TROPONINI in the last 168 hours. BNP (last 3 results) No results for input(s): BNP in the last 8760 hours.  ProBNP (last 3 results) No results for input(s): PROBNP in the last 8760 hours.  CBG: No results for input(s): GLUCAP in the last 168 hours.  No results found for this or any previous visit (from the past 240 hour(s)).   Studies: Ct Head (brain) Wo Contrast  10/06/2014   CLINICAL DATA:  Three day history of generalized headache. Slurred speech and facial droop which began yesterday. Patient unable to have MRI due to an indwelling pacemaker.  EXAM: CT HEAD WITHOUT CONTRAST  TECHNIQUE: Contiguous axial images were obtained from the base of the skull through the vertex without intravenous contrast.  COMPARISON:  None.  FINDINGS: Ventricular system normal in size and appearance for age. Mild cortical atrophy consistent with age. Moderate changes of small vessel disease of the white matter diffusely with more focal low attenuation involving the anterior limb of the right internal capsule. No mass lesion. No midline shift. No acute hemorrhage or hematoma. No extra-axial fluid collections.  No skull fracture or other focal osseous abnormality involving the skull. Visualized paranasal sinuses, bilateral mastoid air cells and bilateral middle ear cavities well-aerated.  IMPRESSION: 1. Age indeterminate but likely subacute or old nonhemorrhagic lacunar stroke involving the anterior limb of the right internal capsule. 2. Mild cortical atrophy and  moderate chronic microvascular ischemic changes of the white matter.   Electronically Signed   By: Evangeline Dakin M.D.   On: 10/06/2014 19:32    Scheduled Meds: . aspirin  300 mg Rectal Daily   Or  . aspirin  325 mg Oral Daily  . budesonide  3 mg Oral Daily  . carbamazepine  200 mg Oral BID  . cholecalciferol  1,000 Units Oral Daily  . docusate sodium  200 mg Oral QPM  . DULoxetine  60 mg Oral q morning - 10a  . enoxaparin (LOVENOX) injection  40 mg Subcutaneous Q24H  . fluticasone  2 spray Each Nare Daily  . levothyroxine  112 mcg Oral QAC breakfast  . loratadine  10 mg Oral Daily  . magnesium oxide  400 mg Oral Daily  . midodrine  10 mg Oral TID WC  . omega-3 acid ethyl esters  1 g Oral Daily  . pantoprazole  40 mg Oral Daily  . potassium chloride  10 mEq Oral Daily  . cyanocobalamin  2,000 mcg Oral Daily   Continuous Infusions: . sodium chloride      Principal Problem:   Stroke Active Problems:   Hypothyroidism   Headache   History of permanent cardiac pacemaker placement   Cephalalgia    Time spent: 35 minutes.     Niel Hummer A  [facility] Pager [phone]. If 7PM-7AM, please contact night-coverage at [email or web address], password [facility] 10/07/2014, 11:26 AM  LOS: 0 days

## 2014-10-07 NOTE — Progress Notes (Signed)
  Echocardiogram 2D Echocardiogram has been performed.  [name] 10/07/2014, 5:46 PM

## 2014-10-07 NOTE — Progress Notes (Signed)
VASCULAR LAB PRELIMINARY  PRELIMINARY  PRELIMINARY  PRELIMINARY  Bilateral lower extremity venous duplex completed.    Preliminary report:  *Bilateral:  No evidence of DVT, superficial thrombosis, or Baker's Cyst.  Frederik Standley, RVS 10/07/2014, 5:19 PM

## 2014-10-07 NOTE — Progress Notes (Signed)
STROKE TEAM PROGRESS NOTE   HISTORY [name] is an 69 y.o. female with multiple medical problems who reports that she awakened at baseline on 10/03/14. Not long after awakening she sneezed and had the onset of sharp pain form her left temple (LKW 0900 10/03/2014). Her headache continued throughout the day but improved enough that she was able to go to sleep that evening. When she awakened on the 23rd her headache persisted. She felt some swelling on the left side of her face as well. She made an appointment to see her eye doctor on the 24th. When seeing her eye doctor he reported that her eyes were normal. He noted that her left eye was ptotic and that she was experiencing monocular right eye diplopia. Today 10/06/2014 she saw Dr. [name] who was concerned about her facial asymmetry. A MRI was ordered but when unable to be performed it was suggested that she be seen in the ED. In the ED the patient began to experience left sided weakness. She had not experienced that prior to being evaluated in the ED. Patient was not administered TPA secondary to outside time window. She was admitted for further evaluation and treatment.   SUBJECTIVE (INTERVAL HISTORY) Her husband is at the bedside.  Overall she feels her condition is stable. She stated that she had bad HA after sneeze then started to have right monocular diplopia, left leg weakness and left facial numbness. Exam showed right lower facial mild weakness. Will do CTA head and neck. MRI brain as her pacer is MRI compatible.    OBJECTIVE Temp:  [97.9 F (36.6 C)-98.8 F (37.1 C)] 98.4 F (36.9 C) (05/26 2123) Pulse Rate:  [63-77] 65 (05/26 2123) Cardiac Rhythm:  [-] Normal sinus rhythm;Ventricular paced (05/26 0100) Resp:  [18] 18 (05/26 2123) BP: (108-144)/(58-79) 126/63 mmHg (05/26 2123) SpO2:  [96 %-100 %] 99 % (05/26 2123) Weight:  [146 lb (66.225 kg)] 146 lb (66.225 kg) (05/25 2255)  No results for input(s): GLUCAP in the  last 168 hours.  Recent Labs Lab 10/06/14 1742 10/06/14 1821 10/07/14 0544  NA 136 138 136  K 3.8 3.9 3.6  CL 103 101 108  CO2 21*  --  22  GLUCOSE 88 84 84  BUN 15 19 11   CREATININE 0.89 0.80 0.67  CALCIUM 9.4  --  8.5*    Recent Labs Lab 10/06/14 1742 10/07/14 0544  AST 26 21  ALT 21 17  ALKPHOS 50 35*  BILITOT 0.4 0.3  PROT 7.4 6.0*  ALBUMIN 4.5 3.5    Recent Labs Lab 10/06/14 1742 10/06/14 1821 10/07/14 0544  WBC 6.8  --  4.5  NEUTROABS 3.7  --  1.7  HGB 15.1* 16.3* 12.9  HCT 44.3 48.0* 37.9  MCV 92.3  --  91.1  PLT 234  --  191   No results for input(s): CKTOTAL, CKMB, CKMBINDEX, TROPONINI in the last 168 hours.  Recent Labs  10/06/14 1742  LABPROT 14.0  INR 1.06    Recent Labs  10/06/14 1930  COLORURINE YELLOW  LABSPEC 1.019  PHURINE 6.5  GLUCOSEU NEGATIVE  HGBUR NEGATIVE  BILIRUBINUR NEGATIVE  KETONESUR 40*  PROTEINUR NEGATIVE  UROBILINOGEN 0.2  NITRITE NEGATIVE  LEUKOCYTESUR NEGATIVE       Component Value Date/Time   CHOL 169 10/07/2014 0544   TRIG 50 10/07/2014 0544   HDL 45 10/07/2014 0544   CHOLHDL 3.8 10/07/2014 0544   VLDL 10 10/07/2014 0544   LDLCALC 114* 10/07/2014 0544  No results found for: HGBA1C No results found for: LABOPIA, COCAINSCRNUR, LABBENZ, AMPHETMU, THCU, LABBARB  No results for input(s): ETH in the last 168 hours.   Ct Head (brain) Wo Contrast  10/06/2014    IMPRESSION: 1. Age indeterminate but likely subacute or old nonhemorrhagic lacunar stroke involving the anterior limb of the right internal capsule. 2. Mild cortical atrophy and moderate chronic microvascular ischemic changes of the white matter.    Ct Angio head and Neck W/cm &/or Wo/cm  10/07/2014    IMPRESSION: Normal CTA of the head and neck. No significant atherosclerotic disease.  No acute intracranial abnormality.   Mr Brain Wo Contrast  10/07/2014  IMPRESSION: No acute infarct.  Mild to slightly moderate small vessel disease type changes.   Global mild atrophy.    LE venous doppler - no DVT  2D echo - pending  PHYSICAL EXAM  Temp:  [97.9 F (36.6 C)-98.8 F (37.1 C)] 98.4 F (36.9 C) (05/26 2123) Pulse Rate:  [63-77] 65 (05/26 2123) Resp:  [18] 18 (05/26 2123) BP: (108-144)/(58-79) 126/63 mmHg (05/26 2123) SpO2:  [96 %-100 %] 99 % (05/26 2123) Weight:  [146 lb (66.225 kg)] 146 lb (66.225 kg) (05/25 2255)  General - Well nourished, well developed, in no apparent distress.  Ophthalmologic - Fundi not visualized due to photophobia.  Cardiovascular - Regular rate and rhythm with no murmur.  Mental Status -  Level of arousal and orientation to time, place, and person were intact. Language including expression, naming, repetition, comprehension was assessed and found intact. Fund of Knowledge was assessed and was intact.  Cranial Nerves II - XII - II - Visual field intact OU. III, IV, VI - Extraocular movements intact. V - Facial sensation exam showed mild left V1 and V2 decreased light touch, however, pt also felt different with frontal bone vibration test indicating psychogenic component. VII - Facial movement intact bilaterally, however at rest, mild right lower facial weakness. VIII - Hearing & vestibular intact bilaterally. X - Palate elevates symmetrically. XI - Chin turning & shoulder shrug intact bilaterally. XII - Tongue protrusion intact.  Motor Strength - The patient's strength was normal in BUE, and RLE. However, on LLE 4/5 with give away weakness and hoover's sign positive on the left. Bulk was normal and fasciculations were absent.   Motor Tone - Muscle tone was assessed at the neck and appendages and was normal.  Reflexes - The patient's reflexes were 1+ in all extremities and she had no pathological reflexes.  Sensory - Light touch, temperature/pinprick testing were assessed and were showed subjectively decreased on the left.    Coordination - The patient had normal movements in the hands and feet  with no ataxia or dysmetria.  Tremor was absent.  Gait and Station - not tested due to safety concerns.   ASSESSMENT/PLAN [name] is a 69 y.o. female with history of high-grade heart block status post pacemaker placement and dysautonomia presenting with right facial, R eye diplopia and left facial/head pain. She did not receive IV t-PA due to delay in arrival. Exam showed functional component but positive right lower facial weakness. MRI and CTA head and neck negative.    Complicated migraine vs. DWI - negative stroke - pt has severe HA with neuro deficit either functional component or bilateral distribution did not fit to vascular territory, consistent with complicated migraine. DWI-negative stroke can not be ruled out at this time but less likely.  Resultant  R mild facial droop  MRI  No acute infarct   CTA head and neck - negative  2D Echo  pending   LE Venous dopplers - negative for DVT  LDL 114  HgbA1c pending  Lovenox 40 mg sq daily for VTE prophylaxis Diet Heart Room service appropriate?: Yes; Fluid consistency:: Thin  no antithrombotic prior to admission, now on aspirin 325 mg orally every day.   Patient counseled to be compliant with her antithrombotic medications. Continue antiplatelet on discharge.  Ongoing aggressive stroke risk factor management  Therapy recommendations:  pending   Disposition:  Pending. anticipate return home  ?? Left hemifacial spasm  Did not appreciate during rounds today  She was put on tegretol 200mg  bid  Will try to taper off  Hypertension  Home meds:   TENORMIN  Stable  Orthostatic Hypotension  On midordrine  Hyperlipidemia  Home meds:  FISH OIL, resumed in hospital  LDL 114, goal < 70  Add pravastatin 20 mg daily (husband concerned given multiple other medications)  Continue statin at discharge  Other Stroke Risk Factors  Advanced age  ETOH use  Other Active Problems  Microscopy  colitis  Hypothyroidism   Other Pertinent History  Has seen Dr. Erling Cruz in the past.   Autonomic neuropathy, followed at [facility] day # 0  Rosalin Hawking, MD PhD Stroke Neurology 10/07/2014 10:54 PM    To contact Stroke Continuity provider, please refer to [email or web address] After hours, contact General Neurology

## 2014-10-07 NOTE — Progress Notes (Signed)
Patient arrived to 4N13 alert & oriented x4 husband at bedside at 2300.  No complaints of pain at this time.  Will continue to monitor.

## 2014-10-07 NOTE — H&P (Signed)
[facility] History and Physical  [name] HUT:[id number] DOB: [birth year] DOA: 10/06/2014  Referring physician: Dr. Aline Brochure. ER physician. PCP: Geoffery Lyons, MD  Specialists: Dr. Caryl Comes. Cardiologist.  Chief Complaint: Headache with left eye ptosis.  HPI: [name] is a 69 y.o. female with history of high-grade heart block status post pacemaker placement and dysautonomia was referred to the ER. Patient has been experiencing some left-sided temporal headache 4 days ago after patient sneezed. Since then patient has been having severe pain in the left side of the head. Patient also had some diplopia and swelling of the left eye. Patient had gone to her ophthalmologist who stated her left eye looked fine but she has had worsening of her vision in the right eye. Patient had gone to her PCP yesterday and the original plans were to get MRI but since it was not able to be done patient was referred to the ER. CT of the head shows possible subacute stroke and the on-call neurologist Dr. Doy Mince had seen the patient and at this time has been admitted for further management. On exam patient also has some right facial droop. Denies any difficulty speaking swallowing or any weakness of upper or lower extremities. Denies any loss of consciousness though patient does have some unsteadiness on walking as per the patient.   Review of Systems: As presented in the history of presenting illness, rest negative.  Past Medical History  Diagnosis Date  . Diverticulosis   . GERD (gastroesophageal reflux disease)   . Esophageal stricture   . Hemorrhoids   . Mallory - Weiss tear     HEALED   . Interstitial cystitis   . ADHD (attention deficit hyperactivity disorder)   . Breast cancer     right side  . Blood transfusion   . Thyroid disease   . Allergy     trees/pollen, mold, fungus, dust mites. Takes allergy shots  . Latex allergy, contact dermatitis   . Cataract   . Depression    . Hernia of abdominal wall     spigelian hernia RLQ - SURGERY TO REPAIR  . H/O hiatal hernia   . Neuromuscular disorder     central nervous system neuropathy- seen per Dr Erling Cruz  . Asthma     allergist Dr Olena Heckle- monthly allergy injections  . Complication of anesthesia     reaction to some anesthetics/ 7/12 anesth record on chart- states prefers epidural  . PONV (postoperative nausea and vomiting)     pt needs scop patch  . Recurrent upper respiratory infection (URI) 1/13- to present    bronchitis following surgery- states improved but still with cough. OV with [facility] Dr [name] 09/06/11 on chart  . Symptomatic bradycardia     a. 12/2012 s/p MDT dual chamber PPM, ser # [id number]; b. 12/2012 post-op course complicated by pericardial effusinon req lead revision.  . Pericardial effusion     a. 12/2012 following ppm placement;  b. 01/01/2013 Echo: EF 55-60%, small pericardial effusion w/o RV collapse-->No need for tap/window.  . Chest pain     a. 12/2012 Cath: nl cors, EF 55-65%.  . Autonomic dysfunction     CENTRAL NERVOUS SYSTEM NEUROPATHY - DX BY DR. Erling Cruz MORE THAN 10 YRS AGO - AND IT IS FELT TO CONTRIBUTE TO THE AUTOMIC  DYSFUNCTION-- PT HAS NUMBNESS LEGS AND FEET AND SOMETIIMES TIPS OF FINGER, SEVERE CONSTIPATION( NO SENSATION TO HAVE BM ), DOUBLE VISION, ORTHOSTATIC HYPOTENSION  . Hypothyroidism   . Bruises easily   .  Pacemaker   . Dysrhythmia     HX OF HIGH GRADE HEART BLOCK - REQUIRED PACEMAKER INSERTION  . Shortness of breath     AT TIMES - BUT MUCH IMPROVED AFTER PACEMAKER WAS REPROGRAMED.  . Arthritis     PAIN AND OA LEFT HIP   Past Surgical History  Procedure Laterality Date  . Mastectomy modified radical      right; with immediate reconstruction  . Laparoscopy  1973  . Abdominal hysterectomy  1974  . Cystoscopy  1975, 2007  . Myringoplasty  1962  . Tympanoplasty  1973    right  . Oophorectomy  1982  . Bladder suspension    . Rectocele repair      with cystocele  repair  . Total hip arthroplasty Right   . Ventral hernia repair  05/30/2011    Procedure: HERNIA REPAIR VENTRAL ADULT;  Surgeon: Haywood Lasso, MD;  Location: [place];  Service: General;  Laterality: Right;  repair right spigelian hernia  . Breast biopsy  2002    NO BLOOD PRESSURES ON RIGHT SIDE/   s/p  axillary node dissection  . Tonsillectomy    . Back surgery      cervical fusion 4-5 with plate  . Cysto with hydrodistension  09/07/2011    Procedure: CYSTOSCOPY/HYDRODISTENSION;  Surgeon: Ailene Rud, MD;  Location: [facility];  Service: Urology;  Laterality: N/A;  INSTILLATION OF MARCAINE/PYRIDIUM INSTILLATION OF MARCAINE/KENALOG  . Pacemaker insertion    . Eye surgery      LASIK EYE SURGERY BILATERAL  . Ulnar nerve transposition Right   . Total hip arthroplasty Left 06/12/2013    Procedure: LEFT TOTAL HIP ARTHROPLASTY ANTERIOR APPROACH;  Surgeon: Mcarthur Rossetti, MD;  Location: [facility];  Service: Orthopedics;  Laterality: Left;  . Permanent pacemaker insertion N/A 12/29/2012    Procedure: PERMANENT PACEMAKER INSERTION;  Surgeon: Deboraha Sprang, MD;  Location: [facility] CATH LAB;  Service: Cardiovascular;  Laterality: N/A;  . Left heart catheterization with coronary angiogram N/A 12/29/2012    Procedure: LEFT HEART CATHETERIZATION WITH CORONARY ANGIOGRAM;  Surgeon: Peter M Martinique, MD;  Location: [facility] CATH LAB;  Service: Cardiovascular;  Laterality: N/A;  . Lead revision N/A 12/30/2012    Procedure: LEAD REVISION;  Surgeon: Evans Lance, MD;  Location: [facility] CATH LAB;  Service: Cardiovascular;  Laterality: N/A;   Social History:  reports that she has never smoked. She has never used smokeless tobacco. She reports that she drinks about 4.2 oz of alcohol per week. She reports that she does not use illicit drugs. Where does patient live home. Can patient participate in ADLs? Yes.  Allergies  Allergen Reactions  . Anesthetics, Amide Other (See Comments)    Patient  unsure of names, however multiples cause swelling of airway & nausea  . Sulfonamide Derivatives Anaphylaxis and Swelling  . Azithromycin Other (See Comments)    Severe gastritis   . Bactroban Other (See Comments)    Causes sores in nose  . Ciprofloxacin     REACTION: joint swelling  . Codeine Swelling  . Meperidine Hcl Nausea Only    Hallucinations   . Morphine Nausea Only    Hallucinations   . Neosporin [Neomycin-Polymyxin-Gramicidin] Hives and Dermatitis    All topical "orin's ointment"  . Nitrofurantoin     REACTION: neuropathy in legs  . Other     ALLERGIC TO WARM WATER SHELLFISH  PT STATES PLEASE NOTE SHE CAN TAKE OXYCODONE AND TYLENOL FOR PAIN -NOT  ALLERGIC  . Penicillins Other (See Comments)    Swelling in joints  . Tramadol     Makes jerk  . Latex Rash    Family History:  Family History  Problem Relation Age of Onset  . Heart disease Father   . Diabetes      aunt  . Uterine cancer      grandmother  . Colon cancer Mother   . Depression Mother   . Cancer Mother     colon  . Pancreatic cancer Sister   . Cancer Sister     pancreatic  . Cancer Maternal Grandmother     ovarian      Prior to Admission medications   Medication Sig Start Date End Date Taking? Authorizing Provider  albuterol (PROVENTIL HFA;VENTOLIN HFA) 108 (90 BASE) MCG/ACT inhaler Inhale 2 puffs into the lungs every 6 (six) hours as needed. For shortness of breath   Yes Historical Provider, MD  ALPHA LIPOIC ACID PO Take 200 mg by mouth daily.   Yes Historical Provider, MD  b complex vitamins capsule Take 1 capsule by mouth daily.    Yes Historical Provider, MD  cholecalciferol (VITAMIN D) 1000 UNITS tablet Take 1,000 Units by mouth daily.   Yes Historical Provider, MD  ciclesonide (OMNARIS) 50 MCG/ACT nasal spray Place 2 sprays into both nostrils as needed.    Yes Historical Provider, MD  cyanocobalamin 2000 MCG tablet Take 2,000 mcg by mouth daily.   Yes Historical Provider, MD  docusate  sodium (COLACE) 100 MG capsule Take 200 mg by mouth every evening.    Yes Historical Provider, MD  DULoxetine (CYMBALTA) 60 MG capsule Take 60 mg by mouth every morning.   Yes Historical Provider, MD  esomeprazole (NEXIUM) 40 MG capsule Take 20 mg by mouth daily.    Yes Historical Provider, MD  estradiol (ESTRING) 2 MG vaginal ring Place 2 mg vaginally every 3 (three) months. follow package directions   Yes Historical Provider, MD  estradiol (VIVELLE-DOT) 0.025 MG/24HR Place 1 patch onto the skin 2 (two) times a week. Applies new patch on Sunday & Thursday   Yes Historical Provider, MD  fexofenadine (ALLEGRA) 180 MG tablet Take 180 mg by mouth daily.    Yes Historical Provider, MD  fish oil-omega-3 fatty acids 1000 MG capsule Take 1 g by mouth daily.    Yes Historical Provider, MD  levothyroxine (SYNTHROID, LEVOTHROID) 112 MCG tablet Take 112 mcg by mouth every morning.    Yes Historical Provider, MD  magnesium oxide (MAG-OX) 400 MG tablet Take 400 mg by mouth daily.   Yes Historical Provider, MD  midodrine (PROAMATINE) 2.5 MG tablet TAKE 1 TABLET THREE TIMES A DAY WITH MEALS 05/25/14  Yes Deboraha Sprang, MD  montelukast (SINGULAIR) 10 MG tablet Take 10 mg by mouth as needed (allergies).    Yes Historical Provider, MD  potassium chloride (KLOR-CON M10) 10 MEQ tablet Take 1 tablet (10 mEq total) by mouth daily. 08/11/14  Yes Deboraha Sprang, MD  PRESCRIPTION MEDICATION ALLERGY SHOT 2 TIMES A WEEK BY DR. Neldon Mc   Yes Historical Provider, MD  Probiotic Product (ALIGN) 4 MG CAPS Take 4 mg by mouth at bedtime.    Yes Historical Provider, MD  atenolol (TENORMIN) 25 MG tablet TAKE 1 TABLET EVERY OTHER DAY Patient not taking: Reported on 10/06/2014 03/19/14   Deboraha Sprang, MD    Physical Exam: Filed Vitals:   10/06/14 2145 10/06/14 2200 10/06/14 2215 10/06/14 2255  BP: 146/72 143/96  128/63 144/79  Pulse: 74 72 67 63  Temp:    97.9 F (36.6 C)  TempSrc:    Oral  Resp: 20 14 18 18   Height:    5\' 7"   (1.702 m)  Weight:    66.225 kg (146 lb)  SpO2: 100% 99% 98% 100%     General:  Well-built and nourished.  Eyes: Anicteric no pallor mild swelling of the left eye lid. Mild erythema around the left eye and cheeks.  ENT: No discharge from the ears eyes nose and mouth.  Neck: No mass felt. No neck rigidity.  Cardiovascular: S1 and S2 heard.  Respiratory: No rhonchi or crepitations.  Abdomen: Soft nontender bowel sounds present.  Skin: Mild erythema of the left. The orbital area and temple and cheeks area.  Musculoskeletal: No edema.  Psychiatric: Appears normal.  Neurologic: Alert awake oriented to time place and person. Moves all extremities.  Labs on Admission:  Basic Metabolic Panel:  Recent Labs Lab 10/06/14 1742 10/06/14 1821  NA 136 138  K 3.8 3.9  CL 103 101  CO2 21*  --   GLUCOSE 88 84  BUN 15 19  CREATININE 0.89 0.80  CALCIUM 9.4  --    Liver Function Tests:  Recent Labs Lab 10/06/14 1742  AST 26  ALT 21  ALKPHOS 50  BILITOT 0.4  PROT 7.4  ALBUMIN 4.5   No results for input(s): LIPASE, AMYLASE in the last 168 hours. No results for input(s): AMMONIA in the last 168 hours. CBC:  Recent Labs Lab 10/06/14 1742 10/06/14 1821  WBC 6.8  --   NEUTROABS 3.7  --   HGB 15.1* 16.3*  HCT 44.3 48.0*  MCV 92.3  --   PLT 234  --    Cardiac Enzymes: No results for input(s): CKTOTAL, CKMB, CKMBINDEX, TROPONINI in the last 168 hours.  BNP (last 3 results) No results for input(s): BNP in the last 8760 hours.  ProBNP (last 3 results) No results for input(s): PROBNP in the last 8760 hours.  CBG: No results for input(s): GLUCAP in the last 168 hours.  Radiological Exams on Admission: Ct Head (brain) Wo Contrast  10/06/2014   CLINICAL DATA:  Three day history of generalized headache. Slurred speech and facial droop which began yesterday. Patient unable to have MRI due to an indwelling pacemaker.  EXAM: CT HEAD WITHOUT CONTRAST  TECHNIQUE: Contiguous  axial images were obtained from the base of the skull through the vertex without intravenous contrast.  COMPARISON:  None.  FINDINGS: Ventricular system normal in size and appearance for age. Mild cortical atrophy consistent with age. Moderate changes of small vessel disease of the white matter diffusely with more focal low attenuation involving the anterior limb of the right internal capsule. No mass lesion. No midline shift. No acute hemorrhage or hematoma. No extra-axial fluid collections.  No skull fracture or other focal osseous abnormality involving the skull. Visualized paranasal sinuses, bilateral mastoid air cells and bilateral middle ear cavities well-aerated.  IMPRESSION: 1. Age indeterminate but likely subacute or old nonhemorrhagic lacunar stroke involving the anterior limb of the right internal capsule. 2. Mild cortical atrophy and moderate chronic microvascular ischemic changes of the white matter.   Electronically Signed   By: Evangeline Dakin M.D.   On: 10/06/2014 19:32    EKG: Independently reviewed. Paced rhythm.  Assessment/Plan Principal Problem:   Stroke Active Problems:   Hypothyroidism   Headache   History of permanent cardiac pacemaker placement   Cephalalgia  1. Stroke - discussed Dr. Doy Mince on call neurologist who at this time feels patient may have had a stroke and wants further workup. Patient cannot get MRI due to pacemaker. At this time I have placed patient on neuro checks for evaluation and carotid Dopplers and 2-D echo has been ordered. Check hemoglobin A1c and lipid panel get physical therapy consult. Aspirin. 2. Headache - discussed with neurologist Dr. Doy Mince. At this time we will be checking sedimentation rate and CRP levels. Patient has been placed on carbamazepine for hemifacial spasms. Closely observe. 3. History of permanent pacemaker placement. 4. History of dysautonomia on midodrine. 5. Hypothyroidism on Synthroid.   DVT Prophylaxis Lovenox.   Code Status: Full code.  Family Communication: Discussed with patient.  Disposition Plan: Admit to inpatient. Likely stay 2 days.    Ras Kollman N. [facility] Pager [phone].  If 7PM-7AM, please contact night-coverage [email or web address] Password [facility] 10/07/2014, 12:18 AM

## 2014-10-08 ENCOUNTER — Inpatient Hospital Stay (HOSPITAL_COMMUNITY): Payer: Medicare Other

## 2014-10-08 DIAGNOSIS — Z950 Presence of cardiac pacemaker: Secondary | ICD-10-CM

## 2014-10-08 DIAGNOSIS — G43909 Migraine, unspecified, not intractable, without status migrainosus: Secondary | ICD-10-CM

## 2014-10-08 DIAGNOSIS — M5481 Occipital neuralgia: Secondary | ICD-10-CM

## 2014-10-08 LAB — HEMOGLOBIN A1C
HEMOGLOBIN A1C: 5.5 % (ref 4.8–5.6)
MEAN PLASMA GLUCOSE: 111 mg/dL

## 2014-10-08 MED ORDER — BUDESONIDE 3 MG PO CPEP: 3.0000 mg | ORAL_CAPSULE | Freq: Every day | ORAL | Status: DC

## 2014-10-08 MED ORDER — ACETAMINOPHEN 325 MG PO TABS: 650.0000 mg | ORAL_TABLET | Freq: Four times a day (QID) | ORAL | Status: DC | PRN

## 2014-10-08 MED ORDER — MIDODRINE HCL 10 MG PO TABS: 10.0000 mg | ORAL_TABLET | Freq: Three times a day (TID) | ORAL | Status: DC

## 2014-10-08 MED ORDER — ASPIRIN EC 81 MG PO TBEC: 81.0000 mg | DELAYED_RELEASE_TABLET | Freq: Every day | ORAL | Status: DC

## 2014-10-08 NOTE — Progress Notes (Signed)
Pt discharged at this time with husband taking all personal belongings. IV discontinued, applied dry dressing. No noted distress. Discharge instructions provided along with prescriptions with verbal understanding. Pt aware of follow up appts.

## 2014-10-08 NOTE — Progress Notes (Signed)
STROKE TEAM PROGRESS NOTE   HISTORY [name] is an 69 y.o. female with multiple medical problems who reports that she awakened at baseline on 10/03/14. Not long after awakening she sneezed and had the onset of sharp pain form her left temple (LKW 0900 10/03/2014). Her headache continued throughout the day but improved enough that she was able to go to sleep that evening. When she awakened on the 23rd her headache persisted. She felt some swelling on the left side of her face as well. She made an appointment to see her eye doctor on the 24th. When seeing her eye doctor he reported that her eyes were normal. He noted that her left eye was ptotic and that she was experiencing monocular right eye diplopia. Today 10/06/2014 she saw Dr. [name] who was concerned about her facial asymmetry. A MRI was ordered but when unable to be performed it was suggested that she be seen in the ED. In the ED the patient began to experience left sided weakness. She had not experienced that prior to being evaluated in the ED. Patient was not administered TPA secondary to outside time window. She was admitted for further evaluation and treatment.   SUBJECTIVE (INTERVAL HISTORY) The patient's husband was at the bedside. Dr. Erlinda Hong explained that the patient's deficits were probably due to a complicated migraine headache and occipital neuralgia after sneeze. The patient stated that her headache has improved. She had been started on Tegretol for facial spasms. Dr. Erlinda Hong recommended discontinuing the Tegretol to see if the spasms returned.   OBJECTIVE Temp:  [97.5 F (36.4 C)-98.8 F (37.1 C)] 97.7 F (36.5 C) (05/27 1025) Pulse Rate:  [62-76] 70 (05/27 1025) Cardiac Rhythm:  [-] Normal sinus rhythm;Ventricular paced (05/26 2037) Resp:  [18] 18 (05/27 1025) BP: (110-135)/(51-75) 110/69 mmHg (05/27 1025) SpO2:  [98 %-100 %] 100 % (05/27 1025)  No results for input(s): GLUCAP in the last 168 hours.  Recent  Labs Lab 10/06/14 1742 10/06/14 1821 10/07/14 0544  NA 136 138 136  K 3.8 3.9 3.6  CL 103 101 108  CO2 21*  --  22  GLUCOSE 88 84 84  BUN 15 19 11   CREATININE 0.89 0.80 0.67  CALCIUM 9.4  --  8.5*    Recent Labs Lab 10/06/14 1742 10/07/14 0544  AST 26 21  ALT 21 17  ALKPHOS 50 35*  BILITOT 0.4 0.3  PROT 7.4 6.0*  ALBUMIN 4.5 3.5    Recent Labs Lab 10/06/14 1742 10/06/14 1821 10/07/14 0544  WBC 6.8  --  4.5  NEUTROABS 3.7  --  1.7  HGB 15.1* 16.3* 12.9  HCT 44.3 48.0* 37.9  MCV 92.3  --  91.1  PLT 234  --  191   No results for input(s): CKTOTAL, CKMB, CKMBINDEX, TROPONINI in the last 168 hours.  Recent Labs  10/06/14 1742  LABPROT 14.0  INR 1.06    Recent Labs  10/06/14 1930  COLORURINE YELLOW  LABSPEC 1.019  PHURINE 6.5  GLUCOSEU NEGATIVE  HGBUR NEGATIVE  BILIRUBINUR NEGATIVE  KETONESUR 40*  PROTEINUR NEGATIVE  UROBILINOGEN 0.2  NITRITE NEGATIVE  LEUKOCYTESUR NEGATIVE       Component Value Date/Time   CHOL 169 10/07/2014 0544   TRIG 50 10/07/2014 0544   HDL 45 10/07/2014 0544   CHOLHDL 3.8 10/07/2014 0544   VLDL 10 10/07/2014 0544   LDLCALC 114* 10/07/2014 0544   Lab Results  Component Value Date   HGBA1C 5.5 10/07/2014   No  results found for: LABOPIA, COCAINSCRNUR, LABBENZ, AMPHETMU, THCU, LABBARB  No results for input(s): ETH in the last 168 hours.  I have personally reviewed the radiological images below and agree with the radiology interpretations.  Ct Head (brain) Wo Contrast  10/06/2014    IMPRESSION: 1. Age indeterminate but likely subacute or old nonhemorrhagic lacunar stroke involving the anterior limb of the right internal capsule. 2. Mild cortical atrophy and moderate chronic microvascular ischemic changes of the white matter.    Ct Angio head and Neck W/cm &/or Wo/cm  10/07/2014    IMPRESSION: Normal CTA of the head and neck. No significant atherosclerotic disease.  No acute intracranial abnormality.   Mr Brain Wo  Contrast  10/07/2014  IMPRESSION: No acute infarct.  Mild to slightly moderate small vessel disease type changes.  Global mild atrophy.    LE venous doppler - no DVT  2D echo  Study Conclusions - Left ventricle: Mild septal dyssynergy from pacing. The cavity size was normal. Wall thickness was normal. The estimated ejection fraction was 55%. - Right ventricle: The cavity size was normal. Systolic function was normal. - Impressions: No cardiac source of embolism was identified, but cannot be ruled out on the basis of this examination. Impressions: - No cardiac source of embolism was identified, but cannot be ruled out on the basis of this examination.  PHYSICAL EXAM  Temp:  [97.5 F (36.4 C)-98.8 F (37.1 C)] 97.7 F (36.5 C) (05/27 1025) Pulse Rate:  [62-76] 70 (05/27 1025) Resp:  [18] 18 (05/27 1025) BP: (110-135)/(51-75) 110/69 mmHg (05/27 1025) SpO2:  [98 %-100 %] 100 % (05/27 1025)  General - Well nourished, well developed, in no apparent distress.  Ophthalmologic - Fundi not visualized due to photophobia.  Cardiovascular - Regular rate and rhythm with no murmur.  Mental Status -  Level of arousal and orientation to time, place, and person were intact. Language including expression, naming, repetition, comprehension was assessed and found intact. Fund of Knowledge was assessed and was intact.  Cranial Nerves II - XII - II - Visual field intact OU. III, IV, VI - Extraocular movements intact. V - Facial sensation exam symmetrical. VII - Facial movement intact bilaterally. VIII - Hearing & vestibular intact bilaterally. X - Palate elevates symmetrically. XI - Chin turning & shoulder shrug intact bilaterally. XII - Tongue protrusion intact.  Motor Strength - The patient's strength was normal in BUE, and BLE. Bulk was normal and fasciculations were absent.   Motor Tone - Muscle tone was assessed at the neck and appendages and was normal.  Reflexes - The  patient's reflexes were 1+ in all extremities and she had no pathological reflexes.  Sensory - Light touch, temperature/pinprick testing were assessed and were showed subjectively decreased on the left.    Coordination - The patient had normal movements in the hands and feet with no ataxia or dysmetria.  Tremor was absent.  Gait and Station - not tested due to safety concerns.   ASSESSMENT/PLAN [name] is a 69 y.o. female with history of high-grade heart block status post pacemaker placement and dysautonomia presenting with right facial, R eye diplopia and left facial/head pain. She did not receive IV t-PA due to delay in arrival. Exam showed functional component but positive right lower facial weakness. MRI and CTA head and neck negative.    Complicated migraine - pt has severe HA with neuro deficit either functional component or bilateral distribution did not fit to vascular territory, consistent with  complicated migraine. DWI-negative stroke can not be ruled out at this time but less likely.  Resultant resolved  MRI  No acute infarct   CTA head and neck - negative  2D Echo  no cardiac source of emboli identified  LE Venous dopplers - negative for DVT  LDL 114  HgbA1c 5.5  Lovenox 40 mg sq daily for VTE prophylaxis Diet - low sodium heart healthy  no antithrombotic prior to admission, now on aspirin 325 mg orally every day.   Patient counseled to be compliant with her antithrombotic medications. Continue antiplatelet on discharge.  Ongoing aggressive stroke risk factor management  Therapy recommendations:  No follow-up therapies recommended.  Disposition:  Discharge to home  Occipital Neuralgia - left  Triggered by sneeze  Left great occipital nerve tenderness on touch  improved  ?? Left hemifacial spasm  Did not appreciate during rounds today  She was put on tegretol 200mg  bid  Will taper off  Close monitoring  Hypertension  Home meds:    TENORMIN  Stable  Orthostatic Hypotension  On midordrine  Hyperlipidemia  Home meds:  FISH OIL, resumed in hospital  LDL 114, goal < 70  Add pravastatin 20 mg daily (husband concerned given multiple other medications)  Continue statin at discharge  Other Stroke Risk Factors  Advanced age  ETOH use  Other Active Problems  Microscopy colitis  Hypothyroidism   Other Pertinent History  Has seen Dr. Erling Cruz in the past.   Autonomic neuropathy, followed at [facility] day # 1   Neurology will sign off. Please call with questions. Follow up with neurology as needed. Thanks for the consult.  Rosalin Hawking, MD PhD Stroke Neurology 10/09/2014 7:33 AM      To contact Stroke Continuity provider, please refer to [email or web address] After hours, contact General Neurology

## 2014-10-08 NOTE — Evaluation (Signed)
Physical Therapy Evaluation Patient Details Name: [name] MRN: [id number] DOB: [birth year] Today's Date: 10/08/2014   History of Present Illness  69 yo female presenting with L side HA 5/22. CT subacute R internal capsule hypodensity. MRI (-) PMH: diverticulosis, esophageal stricture, GERD, ADHD, CA breast R, cataract, depression, BIL hip surg,   Clinical Impression  Patient demonstrates deficits in functional mobility as indicated below. Will need continued skilled PT to address deficits and maximize function. Will see as indicated and progress as tolerated. OF NOTE: Inconsistent presentation during assessment, will trial PT and continue to monitor deficits. Recommended that patient have husband provide close supervision with mobility as well as use of RW if patient continues to feel altered sensation in LLE.     Follow Up Recommendations Supervision for mobility/OOB    Equipment Recommendations  None recommended by PT    Recommendations for Other Services       Precautions / Restrictions Precautions Precautions: Fall Restrictions Weight Bearing Restrictions: No      Mobility  Bed Mobility Overal bed mobility: Modified Independent             General bed mobility comments: increased time to come to EOB, no need for assist  Transfers Overall transfer level: Needs assistance Equipment used: None Transfers: Sit to/from Stand Sit to Stand: Min guard         General transfer comment: Min guard for safety secondary to impulsivity  Ambulation/Gait Ambulation/Gait assistance: Min guard;Min assist Ambulation Distance (Feet): 180 Feet Assistive device: None Gait Pattern/deviations: Staggering left;Staggering right;Drifts right/left (inconsistent gait) Gait velocity: decreased Gait velocity interpretation: Below normal speed for age/gender General Gait Details: patient with inconsistent gait. Patient reports left sided weakness but during ambulation fell into  doorway on the right side stating her leg gave out. Patient presents with alternating antalgic gait at times antalgic on the right at other times antalgic on the left but reports no pain. Patient reports her LLE is "spongy" during walking, but could not discern what this meant.   Stairs Stairs: Yes Stairs assistance: Min guard Stair Management: One rail Right;Step to pattern Number of Stairs: 12 General stair comments: no difficulty performing stairs, educated on step to technique and sequencing for reported LLE weakness, no evidence of instability noted  Wheelchair Mobility    Modified Rankin (Stroke Patients Only)       Balance Overall balance assessment: Needs assistance   Sitting balance-Leahy Scale: Good         Standing balance comment: assist for stability due to inconsistency with fluctuating reported weakness                             Pertinent Vitals/Pain Pain Assessment: No/denies pain    Home Living Family/patient expects to be discharged to:: Private residence Living Arrangements: Spouse/significant other Available Help at Discharge: Family Type of Home: House Home Access: Stairs to enter Entrance Stairs-Rails: None Entrance Stairs-Number of Steps: 3 Home Layout: Multi-level;Able to live on main level with bedroom/bathroom Home Equipment: Shower seat - built in      Prior Function Level of Independence: Independent               Hand Dominance   Dominant Hand: Right    Extremity/Trunk Assessment   Upper Extremity Assessment: Defer to OT evaluation           Lower Extremity Assessment:  (inconsistent strength assessment with effort At times symmetrical strength 5/5,  but when asked to perform on command patient with non-sustained contraction of LLE 4/5)         Communication   Communication: No difficulties  Cognition Arousal/Alertness: Awake/alert Behavior During Therapy: Flat affect (Simultaneous filing. User may not  have seen previous data.) Overall Cognitive Status: No family/caregiver present to determine baseline cognitive functioning                      General Comments      Exercises        Assessment/Plan    PT Assessment Patient needs continued PT services  PT Diagnosis Difficulty walking;Abnormality of gait   PT Problem List Decreased balance;Decreased mobility  PT Treatment Interventions DME instruction;Gait training;Stair training;Functional mobility training;Therapeutic activities;Therapeutic exercise;Balance training;Patient/family education   PT Goals (Current goals can be found in the Care Plan section) Acute Rehab PT Goals Patient Stated Goal: to go home PT Goal Formulation: With patient Time For Goal Achievement: 10/22/14 Potential to Achieve Goals: Good    Frequency Min 2X/week   Barriers to discharge        Co-evaluation               End of Session Equipment Utilized During Treatment: Gait belt Activity Tolerance: Patient tolerated treatment well Patient left: in chair;with call bell/phone within reach;with chair alarm set Nurse Communication: Mobility status         Time: 9476-5465 PT Time Calculation (min) (ACUTE ONLY): 22 min   Charges:   PT Evaluation $Initial PT Evaluation Tier I: 1 Procedure     PT G CodesDuncan Dull 13-Oct-2014, 10:47 AM Alben Deeds, PT DPT  [phone]

## 2014-10-08 NOTE — Discharge Summary (Signed)
Physician Discharge Summary  [name] QQI:[id number] DOB: 06-Dec-1945 DOA: 10/06/2014  PCP: Geoffery Lyons, MD  Admit date: 10/06/2014 Discharge date: 10/08/2014  Time spent: 35 minutes  Recommendations for Outpatient Follow-up:  Needs further evaluation for facial  muscle spasm if recurrences. Could try carbamazepine  Discharge Diagnoses:    Possible complex Migraine.    Hypothyroidism   Headache   History of permanent cardiac pacemaker placement   Cephalalgia   Complicated migraine   HLD (hyperlipidemia)   Discharge Condition: Stable  Diet recommendation: Heart Healthy  Filed Weights   10/06/14 2255  Weight: 66.225 kg (146 lb)    History of present illness:  [name] is a 69 y.o. female with history of high-grade heart block status post pacemaker placement and dysautonomia was referred to the ER. Patient has been experiencing some left-sided temporal headache 4 days ago after patient sneezed. Since then patient has been having severe pain in the left side of the head. Patient also had some diplopia and swelling of the left eye. Patient had gone to her ophthalmologist who stated her left eye looked fine but she has had worsening of her vision in the right eye. Patient had gone to her PCP yesterday and the original plans were to get MRI but since it was not able to be done patient was referred to the ER. CT of the head shows possible subacute stroke and the on-call neurologist Dr. Doy Mince had seen the patient and at this time has been admitted for further management. On exam patient also has some right facial droop. Denies any difficulty speaking swallowing or any weakness of upper or lower extremities. Denies any loss of consciousness though patient does have some unsteadiness on walking as per the patient.  Hospital Course:   1-Facial weakness, headaches,; possible related to complex migraine.  Work up for stroke negative. MRI negative, CTA neck and head  negative.  ECHO normal EF.  LDL 114, husband with concern with multiple medications. Needs to follow up with PCP  Hb-A1c 5.7 ESR 2 and CRP 0.5  Discussed with Dr Erlinda Hong, he recommend to stop carbamazepine.   2-Orthostatic hypotension:  Per patient midodrine dose is 10 mg TID.   3-Microscopy colitis: Continue with Budesonide.   4-Hypothyroidism:  Continue with synthroid.  5-Facial spasm; needs to follow up with neurology. Carbamazepine stopped per neuro recommendation.   Procedures:  ECHO; Ef 55 %, normal wall motion.   Consultations:  neurology  Discharge Exam: Filed Vitals:   10/08/14 1025  BP: 110/69  Pulse: 70  Temp: 97.7 F (36.5 C)  Resp: 18    General: NAD Cardiovascular: S 1 S 2 RRR Respiratory: CTA  Discharge Instructions   Discharge Instructions    Diet - low sodium heart healthy    Complete by:  As directed      Increase activity slowly    Complete by:  As directed           Current Discharge Medication List    START taking these medications   Details  budesonide (ENTOCORT EC) 3 MG 24 hr capsule Take 1 capsule (3 mg total) by mouth daily. Qty: 30 capsule, Refills: 0      CONTINUE these medications which have CHANGED   Details  midodrine (PROAMATINE) 10 MG tablet Take 1 tablet (10 mg total) by mouth 3 (three) times daily with meals. Qty: 60 tablet, Refills: 0      CONTINUE these medications which have NOT CHANGED   Details  albuterol (PROVENTIL HFA;VENTOLIN HFA) 108 (90 BASE) MCG/ACT inhaler Inhale 2 puffs into the lungs every 6 (six) hours as needed. For shortness of breath    ALPHA LIPOIC ACID PO Take 200 mg by mouth daily.    b complex vitamins capsule Take 1 capsule by mouth daily.     cholecalciferol (VITAMIN D) 1000 UNITS tablet Take 1,000 Units by mouth daily.    ciclesonide (OMNARIS) 50 MCG/ACT nasal spray Place 2 sprays into both nostrils as needed.     cyanocobalamin 2000 MCG tablet Take 2,000 mcg by mouth daily.     docusate sodium (COLACE) 100 MG capsule Take 200 mg by mouth every evening.     DULoxetine (CYMBALTA) 60 MG capsule Take 60 mg by mouth every morning.    esomeprazole (NEXIUM) 40 MG capsule Take 20 mg by mouth daily.     estradiol (ESTRING) 2 MG vaginal ring Place 2 mg vaginally every 3 (three) months. follow package directions    estradiol (VIVELLE-DOT) 0.025 MG/24HR Place 1 patch onto the skin 2 (two) times a week. Applies new patch on Sunday & Thursday    fexofenadine (ALLEGRA) 180 MG tablet Take 180 mg by mouth daily.     fish oil-omega-3 fatty acids 1000 MG capsule Take 1 g by mouth daily.     levothyroxine (SYNTHROID, LEVOTHROID) 112 MCG tablet Take 112 mcg by mouth every morning.     magnesium oxide (MAG-OX) 400 MG tablet Take 400 mg by mouth daily.    montelukast (SINGULAIR) 10 MG tablet Take 10 mg by mouth as needed (allergies).     potassium chloride (KLOR-CON M10) 10 MEQ tablet Take 1 tablet (10 mEq total) by mouth daily. Qty: 30 tablet, Refills: 6    PRESCRIPTION MEDICATION ALLERGY SHOT 2 TIMES A WEEK BY DR. Neldon Mc    Probiotic Product (ALIGN) 4 MG CAPS Take 4 mg by mouth at bedtime.       STOP taking these medications     atenolol (TENORMIN) 25 MG tablet        Allergies  Allergen Reactions  . Anesthetics, Amide Other (See Comments)    Patient unsure of names, however multiples cause swelling of airway & nausea  . Sulfonamide Derivatives Anaphylaxis and Swelling  . Azithromycin Other (See Comments)    Severe gastritis   . Bactroban Other (See Comments)    Causes sores in nose  . Ciprofloxacin     REACTION: joint swelling  . Codeine Swelling  . Meperidine Hcl Nausea Only    Hallucinations   . Morphine Nausea Only    Hallucinations   . Neosporin [Neomycin-Polymyxin-Gramicidin] Hives and Dermatitis    All topical "orin's ointment"  . Nitrofurantoin     REACTION: neuropathy in legs  . Other     ALLERGIC TO WARM WATER SHELLFISH  PT STATES PLEASE  NOTE SHE CAN TAKE OXYCODONE AND TYLENOL FOR PAIN -NOT ALLERGIC  . Penicillins Other (See Comments)    Swelling in joints  . Tramadol     Makes jerk  . Latex Rash   Follow-up Information    Follow up with ARONSON,RICHARD A, MD In 1 week.   Specialty:  Internal Medicine   Contact information:   [place] [address] [phone]       Follow up with Xu,Jindong, MD In 2 weeks.   Specialty:  Neurology   Contact information:   [address] [place]  [id number] [phone]        The  results of significant diagnostics from this hospitalization (including imaging, microbiology, ancillary and laboratory) are listed below for reference.    Significant Diagnostic Studies: Ct Angio Head W/cm &/or Wo Cm  10/07/2014   CLINICAL DATA:  Stroke.  Breast cancer with mastectomy.  EXAM: CT ANGIOGRAPHY HEAD AND NECK  TECHNIQUE: Multidetector CT imaging of the head and neck was performed using the standard protocol during bolus administration of intravenous contrast. Multiplanar CT image reconstructions and MIPs were obtained to evaluate the vascular anatomy. Carotid stenosis measurements (when applicable) are obtained utilizing NASCET criteria, using the distal internal carotid diameter as the denominator.  CONTRAST:  39m OMNIPAQUE IOHEXOL 350 MG/ML SOLN  COMPARISON:  MRI head 10/07/2014.  FINDINGS: CT HEAD  Brain: Mild atrophy. Negative for hydrocephalus. No acute infarct. Negative for hemorrhage or mass.  Calvarium and skull base: Negative  Paranasal sinuses: Negative  Orbits: Negative  CTA NECK  Aortic arch: Normal aortic arch. Proximal great vessels widely patent. Apical scarring bilaterally in the lungs.  Right carotid system: Normal right carotid artery. Negative for atherosclerotic disease or dissection. No carotid stenosis  Left carotid system: Normal left carotid artery. Negative for atherosclerotic disease or dissection. No carotid stenosis.  Vertebral  arteries:Both vertebral arteries are patent to the basilar without stenosis.  Skeleton: ACDF C5-6 and C6-7.  No focal bony lesion identified.  Other neck: Negative for adenopathy in the neck.  CTA HEAD  Anterior circulation: Internal carotid artery patent bilaterally without stenosis or atherosclerotic disease. Anterior and middle cerebral arteries are normal bilaterally.  Posterior circulation: Both vertebral arteries patent to the basilar. PICA patent bilaterally. Basilar widely patent. Superior cerebellar and posterior cerebral arteries widely patent bilaterally.  Venous sinuses: Patent  Anatomic variants: Negative for cerebral aneurysm  Delayed phase: Normal enhancement  IMPRESSION: Normal CTA of the head and neck. No significant atherosclerotic disease.  No acute intracranial abnormality.   Electronically Signed   By: CFranchot GalloM.D.   On: 10/07/2014 17:12   Dg Chest 2 View  10/07/2014   CLINICAL DATA:  Stroke symptoms with left-sided headache for 4 days. History of multiple heart and Long conditions. Initial encounter.  EXAM: CHEST  2 VIEW  COMPARISON:  06/08/2013 radiographs.  FINDINGS: The heart size and mediastinal contours are stable. Left subclavian pacemaker leads are unchanged within the right atrium and right ventricle. There is stable scarring at the right apex, probably related to prior radiation therapy. The lungs are otherwise clear. There is no pleural effusion. Surgical clips are present within the right axilla status post right mastectomy. Lower cervical fusion noted.  IMPRESSION: Stable postoperative findings with probable radiation changes at the right apex. No acute cardiopulmonary process.   Electronically Signed   By: WRichardean SaleM.D.   On: 10/07/2014 14:49   Ct Head (brain) Wo Contrast  10/06/2014   CLINICAL DATA:  Three day history of generalized headache. Slurred speech and facial droop which began yesterday. Patient unable to have MRI due to an indwelling pacemaker.  EXAM:  CT HEAD WITHOUT CONTRAST  TECHNIQUE: Contiguous axial images were obtained from the base of the skull through the vertex without intravenous contrast.  COMPARISON:  None.  FINDINGS: Ventricular system normal in size and appearance for age. Mild cortical atrophy consistent with age. Moderate changes of small vessel disease of the white matter diffusely with more focal low attenuation involving the anterior limb of the right internal capsule. No mass lesion. No midline shift. No acute hemorrhage or hematoma. No extra-axial fluid  collections.  No skull fracture or other focal osseous abnormality involving the skull. Visualized paranasal sinuses, bilateral mastoid air cells and bilateral middle ear cavities well-aerated.  IMPRESSION: 1. Age indeterminate but likely subacute or old nonhemorrhagic lacunar stroke involving the anterior limb of the right internal capsule. 2. Mild cortical atrophy and moderate chronic microvascular ischemic changes of the white matter.   Electronically Signed   By: Evangeline Dakin M.D.   On: 10/06/2014 19:32   Ct Angio Neck W/cm &/or Wo/cm  10/07/2014   CLINICAL DATA:  Stroke.  Breast cancer with mastectomy.  EXAM: CT ANGIOGRAPHY HEAD AND NECK  TECHNIQUE: Multidetector CT imaging of the head and neck was performed using the standard protocol during bolus administration of intravenous contrast. Multiplanar CT image reconstructions and MIPs were obtained to evaluate the vascular anatomy. Carotid stenosis measurements (when applicable) are obtained utilizing NASCET criteria, using the distal internal carotid diameter as the denominator.  CONTRAST:  25m OMNIPAQUE IOHEXOL 350 MG/ML SOLN  COMPARISON:  MRI head 10/07/2014.  FINDINGS: CT HEAD  Brain: Mild atrophy. Negative for hydrocephalus. No acute infarct. Negative for hemorrhage or mass.  Calvarium and skull base: Negative  Paranasal sinuses: Negative  Orbits: Negative  CTA NECK  Aortic arch: Normal aortic arch. Proximal great vessels  widely patent. Apical scarring bilaterally in the lungs.  Right carotid system: Normal right carotid artery. Negative for atherosclerotic disease or dissection. No carotid stenosis  Left carotid system: Normal left carotid artery. Negative for atherosclerotic disease or dissection. No carotid stenosis.  Vertebral arteries:Both vertebral arteries are patent to the basilar without stenosis.  Skeleton: ACDF C5-6 and C6-7.  No focal bony lesion identified.  Other neck: Negative for adenopathy in the neck.  CTA HEAD  Anterior circulation: Internal carotid artery patent bilaterally without stenosis or atherosclerotic disease. Anterior and middle cerebral arteries are normal bilaterally.  Posterior circulation: Both vertebral arteries patent to the basilar. PICA patent bilaterally. Basilar widely patent. Superior cerebellar and posterior cerebral arteries widely patent bilaterally.  Venous sinuses: Patent  Anatomic variants: Negative for cerebral aneurysm  Delayed phase: Normal enhancement  IMPRESSION: Normal CTA of the head and neck. No significant atherosclerotic disease.  No acute intracranial abnormality.   Electronically Signed   By: CFranchot GalloM.D.   On: 10/07/2014 17:12   Mr Brain Wo Contrast  10/07/2014   CLINICAL DATA:  69year old female with history of multiple medical problems including breast cancer. After sneezing on 10/03/2014, developed acute sharp pain the left temporal region. Headache has persisted. Left high tonic and right eye diplopia. [employer] pacemaker. Subsequent encounter.  EXAM: MRI HEAD WITHOUT CONTRAST  TECHNIQUE: Multiplanar, multiecho pulse sequences of the brain and surrounding structures were obtained without intravenous contrast.  COMPARISON:  10/06/2014 head CT.  FINDINGS: No acute infarct.  Mild to slightly moderate small vessel disease type changes.  Global mild atrophy without hydrocephalus.  No intracranial mass lesion noted on this unenhanced exam.  No intracranial  hemorrhage.  Major intracranial vascular structures are patent.  Cervical medullary junction, pituitary region, pineal region and orbital structures unremarkable.  IMPRESSION: No acute infarct.  Mild to slightly moderate small vessel disease type changes.  Global mild atrophy.   Electronically Signed   By: SGenia DelM.D.   On: 10/07/2014 16:24    Microbiology: No results found for this or any previous visit (from the past 240 hour(s)).   Labs: Basic Metabolic Panel:  Recent Labs Lab 10/06/14 1742 10/06/14 1821 10/07/14 0544  NA 136  138 136  K 3.8 3.9 3.6  CL 103 101 108  CO2 21*  --  22  GLUCOSE 88 84 84  BUN _0 CREATININE 0.89 0.80 0.67  CALCIUM 9.4  --  8.5*   Liver Function Tests:  Recent Labs Lab 10/06/14 1742 10/07/14 0544  AST 26 21  ALT 21 17  ALKPHOS 50 35*  BILITOT 0.4 0.3  PROT 7.4 6.0*  ALBUMIN 4.5 3.5   No results for input(s): LIPASE, AMYLASE in the last 168 hours. No results for input(s): AMMONIA in the last 168 hours. CBC:  Recent Labs Lab 10/06/14 1742 10/06/14 1821 10/07/14 0544  WBC 6.8  --  4.5  NEUTROABS 3.7  --  1.7  HGB 15.1* 16.3* 12.9  HCT 44.3 48.0* 37.9  MCV 92.3  --  91.1  PLT 234  --  191   Cardiac Enzymes: No results for input(s): CKTOTAL, CKMB, CKMBINDEX, TROPONINI in the last 168 hours. BNP: BNP (last 3 results) No results for input(s): BNP in the last 8760 hours.  ProBNP (last 3 results) No results for input(s): PROBNP in the last 8760 hours.  CBG: No results for input(s): GLUCAP in the last 168 hours.     SignedNiel Hummer A  [facility] 10/08/2014, 11:26 AM

## 2014-10-08 NOTE — Evaluation (Signed)
Occupational Therapy Evaluation Patient Details Name: [name] MRN: [id number] DOB: [birth year] Today's Date: 10/08/2014    History of Present Illness 69 yo female presenting with L side HA 5/22. CT subacute R internal capsule hypodensity. MRI (-) PMH: diverticulosis, esophageal stricture, GERD, ADHD, CA breast R, cataract, depression, BIL hip surg,    Clinical Impression   Patient evaluated by Occupational Therapy with no further acute OT needs identified. All education has been completed and the patient has no further questions. See below for any follow-up Occupational Therapy or equipment needs. OT to sign off. Thank you for referral.   Recommend follow up with eye doctor of patients choice to address R eye monocular visual changes. Pt able to complete adls at sink level. Pt anxious to d/ c home today per patient.      Follow Up Recommendations  No OT follow up    Equipment Recommendations  None recommended by OT    Recommendations for Other Services Other (comment) (follow up with eye doctor of patients choice)     Precautions / Restrictions Precautions Precautions: Fall Restrictions Weight Bearing Restrictions: No      Mobility Bed Mobility               General bed mobility comments: in chair on arrival  Transfers Overall transfer level: Modified independent               General transfer comment: required incr time ( pt reports I do this because of my blood pressure and have for years)    Balance Overall balance assessment: Needs assistance           Standing balance-Leahy Scale: Fair Standing balance comment: holding onto sink counter for support               High Level Balance Comments: pt reports at home doing yoga. Pt reports being able to place R LE behind head at baseline and knows L LE has something wrong because right now she is unable to do that on the L            ADL Overall ADL's : Needs assistance/impaired     Grooming: Wash/dry hands;Wash/dry face;Oral care;Min guard;Standing Grooming Details (indicate cue type and reason): ue support on counter Upper Body Bathing: Min guard;Standing   Lower Body Bathing: Min guard;Sit to/from stand Lower Body Bathing Details (indicate cue type and reason): pt able to sit on 3n1 and lift BIL LE up to sink level. Pt states L LE feels heavier than R LE. Pt states "i just can't lift it on my own" Upper Body Dressing : Min guard;Standing   Lower Body Dressing: Min guard;Sit to/from stand   Toilet Transfer: Min guard           Functional mobility during ADLs: Min guard General ADL Comments: pt reports hx of orthostatic BP and requesting incr time to static stand prior to mobilizing. pt inconsistent with visual assessment see below. Ot recommending follow up with eye doctor based on assessment. pt smiling symmetrical during phone call to husband but at other points in session R side facial droop. Pt able to complete full adl without assistance at sink level     Vision Vision Assessment?: Yes Eye Alignment: Within Functional Limits Ocular Range of Motion: Within Functional Limits Tracking/Visual Pursuits: Requires cues, head turns, or add eye shifts to track;Other (comment);Impaired - to be further tested in functional context (frog hoping with L eye past object) Convergence: Within functional limits  Diplopia Assessment: Objects split side to side;Present in near gaze;Present all the time/all directions;Other (comment) Additional Comments: Pt reports R eye ( L eye occluded) monocular diplopia as lateral shadow the same size as the card presented. pt reports diplopia only in near gaze of less than 8 feet. Pt reports when looking at TV- ability to see everything except the human faces on the screen. ( tv had 3 females in dresses with a big title across the top of the screen) pt able to detail dress designs but states " i can't see the faces on those women" Pt with  L eye ( R eye occluded) with eye movement object shoots off to the R side Pt reports diplopia with both eyes open in near sight. pt completed adl but did not complain of any diplopia during task. Pt reports HA mild and worsening with assessment. pt reports radiating HA from R side of head across forehead shooting to L ear.    Perception     Praxis      Pertinent Vitals/Pain Pain Assessment: No/denies pain     Hand Dominance Right   Extremity/Trunk Assessment Upper Extremity Assessment Upper Extremity Assessment: Overall WFL for tasks assessed   Lower Extremity Assessment Lower Extremity Assessment: Defer to PT evaluation   Cervical / Trunk Assessment Cervical / Trunk Assessment: Normal   Communication Communication Communication: No difficulties   Cognition Arousal/Alertness: Awake/alert Behavior During Therapy: Flat affect Overall Cognitive Status: No family/caregiver present to determine baseline cognitive functioning                     General Comments       Exercises       Shoulder Instructions      Home Living Family/patient expects to be discharged to:: Private residence Living Arrangements: Spouse/significant other Available Help at Discharge: Family Type of Home: House Home Access: Stairs to enter Technical brewer of Steps: 3 Entrance Stairs-Rails: None Home Layout: Multi-level;Able to live on main level with bedroom/bathroom Alternate Level Stairs-Number of Steps: 12 Alternate Level Stairs-Rails: Can reach both Bathroom Shower/Tub: Occupational psychologist: Handicapped height     Home Equipment: [place] - built in   Additional Comments: has elevator in the house      Prior Functioning/Environment Level of Independence: Independent             OT Diagnosis: Generalized weakness   OT Problem List:     OT Treatment/Interventions:      OT Goals(Current goals can be found in the care plan section) Acute Rehab OT  Goals Patient Stated Goal: to go home  OT Frequency:     Barriers to D/C:            Co-evaluation              End of Session Equipment Utilized During Treatment: Gait belt Nurse Communication: Mobility status;Precautions  Activity Tolerance: Patient tolerated treatment well Patient left: in bed;with call bell/phone within reach;with chair alarm set   Time: [phone] OT Time Calculation (min): 31 min Charges:  OT General Charges $OT Visit: 1 Procedure OT Evaluation $Initial OT Evaluation Tier I: 1 Procedure OT Treatments $Self Care/Home Management : 8-22 mins G-Codes:    Peri Maris 2014-10-16, 12:27 PM  Pager: [phone]

## 2014-10-09 DIAGNOSIS — M5481 Occipital neuralgia: Secondary | ICD-10-CM | POA: Insufficient documentation

## 2014-10-12 ENCOUNTER — Encounter: Payer: Self-pay | Admitting: Internal Medicine

## 2014-10-12 DIAGNOSIS — I471 Supraventricular tachycardia: Secondary | ICD-10-CM

## 2014-10-13 ENCOUNTER — Emergency Department (HOSPITAL_COMMUNITY): Payer: Medicare Other

## 2014-10-13 ENCOUNTER — Emergency Department (HOSPITAL_COMMUNITY)
Admission: EM | Admit: 2014-10-13 | Discharge: 2014-10-13 | Disposition: A | Discharge status: Home / Self Care | Payer: Medicare Other | Attending: Emergency Medicine | Admitting: Emergency Medicine

## 2014-10-13 ENCOUNTER — Encounter (HOSPITAL_COMMUNITY): Payer: Self-pay | Admitting: Family Medicine

## 2014-10-13 DIAGNOSIS — K2970 Gastritis, unspecified, without bleeding: Secondary | ICD-10-CM | POA: Diagnosis not present

## 2014-10-13 DIAGNOSIS — I509 Heart failure, unspecified: Secondary | ICD-10-CM | POA: Diagnosis not present

## 2014-10-13 DIAGNOSIS — M1990 Unspecified osteoarthritis, unspecified site: Secondary | ICD-10-CM | POA: Insufficient documentation

## 2014-10-13 DIAGNOSIS — I480 Paroxysmal atrial fibrillation: Secondary | ICD-10-CM | POA: Diagnosis not present

## 2014-10-13 DIAGNOSIS — Z7982 Long term (current) use of aspirin: Secondary | ICD-10-CM | POA: Insufficient documentation

## 2014-10-13 DIAGNOSIS — Z79899 Other long term (current) drug therapy: Secondary | ICD-10-CM | POA: Diagnosis not present

## 2014-10-13 DIAGNOSIS — Z9889 Other specified postprocedural states: Secondary | ICD-10-CM | POA: Insufficient documentation

## 2014-10-13 DIAGNOSIS — F909 Attention-deficit hyperactivity disorder, unspecified type: Secondary | ICD-10-CM | POA: Diagnosis not present

## 2014-10-13 DIAGNOSIS — Z880 Allergy status to penicillin: Secondary | ICD-10-CM | POA: Insufficient documentation

## 2014-10-13 DIAGNOSIS — Z87448 Personal history of other diseases of urinary system: Secondary | ICD-10-CM | POA: Diagnosis not present

## 2014-10-13 DIAGNOSIS — Z872 Personal history of diseases of the skin and subcutaneous tissue: Secondary | ICD-10-CM | POA: Insufficient documentation

## 2014-10-13 DIAGNOSIS — R4182 Altered mental status, unspecified: Secondary | ICD-10-CM | POA: Insufficient documentation

## 2014-10-13 DIAGNOSIS — Z853 Personal history of malignant neoplasm of breast: Secondary | ICD-10-CM | POA: Diagnosis not present

## 2014-10-13 DIAGNOSIS — R51 Headache: Secondary | ICD-10-CM | POA: Insufficient documentation

## 2014-10-13 DIAGNOSIS — Z91040 Latex allergy status: Secondary | ICD-10-CM | POA: Insufficient documentation

## 2014-10-13 DIAGNOSIS — E039 Hypothyroidism, unspecified: Secondary | ICD-10-CM | POA: Diagnosis not present

## 2014-10-13 DIAGNOSIS — J45909 Unspecified asthma, uncomplicated: Secondary | ICD-10-CM | POA: Diagnosis not present

## 2014-10-13 DIAGNOSIS — Z7951 Long term (current) use of inhaled steroids: Secondary | ICD-10-CM | POA: Insufficient documentation

## 2014-10-13 DIAGNOSIS — F4489 Other dissociative and conversion disorders: Secondary | ICD-10-CM | POA: Diagnosis not present

## 2014-10-13 DIAGNOSIS — R079 Chest pain, unspecified: Secondary | ICD-10-CM | POA: Diagnosis not present

## 2014-10-13 DIAGNOSIS — Z950 Presence of cardiac pacemaker: Secondary | ICD-10-CM | POA: Diagnosis not present

## 2014-10-13 DIAGNOSIS — R1013 Epigastric pain: Secondary | ICD-10-CM | POA: Insufficient documentation

## 2014-10-13 DIAGNOSIS — Z8669 Personal history of other diseases of the nervous system and sense organs: Secondary | ICD-10-CM | POA: Diagnosis not present

## 2014-10-13 DIAGNOSIS — R112 Nausea with vomiting, unspecified: Secondary | ICD-10-CM | POA: Diagnosis not present

## 2014-10-13 DIAGNOSIS — Z8709 Personal history of other diseases of the respiratory system: Secondary | ICD-10-CM | POA: Insufficient documentation

## 2014-10-13 DIAGNOSIS — R1010 Upper abdominal pain, unspecified: Secondary | ICD-10-CM | POA: Diagnosis present

## 2014-10-13 DIAGNOSIS — Z6822 Body mass index (BMI) 22.0-22.9, adult: Secondary | ICD-10-CM | POA: Diagnosis not present

## 2014-10-13 DIAGNOSIS — N200 Calculus of kidney: Secondary | ICD-10-CM | POA: Diagnosis not present

## 2014-10-13 DIAGNOSIS — K219 Gastro-esophageal reflux disease without esophagitis: Secondary | ICD-10-CM | POA: Diagnosis not present

## 2014-10-13 DIAGNOSIS — R109 Unspecified abdominal pain: Secondary | ICD-10-CM | POA: Diagnosis not present

## 2014-10-13 DIAGNOSIS — I6789 Other cerebrovascular disease: Secondary | ICD-10-CM | POA: Diagnosis not present

## 2014-10-13 DIAGNOSIS — F329 Major depressive disorder, single episode, unspecified: Secondary | ICD-10-CM | POA: Diagnosis not present

## 2014-10-13 LAB — I-STAT CHEM 8, ED
BUN: 12 mg/dL (ref 6–20)
Calcium, Ion: 1.2 mmol/L (ref 1.13–1.30)
Chloride: 103 mmol/L (ref 101–111)
Creatinine, Ser: 0.6 mg/dL (ref 0.44–1.00)
Glucose, Bld: 102 mg/dL — ABNORMAL HIGH (ref 65–99)
HCT: 48 % — ABNORMAL HIGH (ref 36.0–46.0)
Hemoglobin: 16.3 g/dL — ABNORMAL HIGH (ref 12.0–15.0)
Potassium: 3.3 mmol/L — ABNORMAL LOW (ref 3.5–5.1)
Sodium: 138 mmol/L (ref 135–145)
TCO2: 18 mmol/L (ref 0–100)

## 2014-10-13 LAB — CBC WITH DIFFERENTIAL/PLATELET
BASOS ABS: 0 10*3/uL (ref 0.0–0.1)
Basophils Relative: 0 % (ref 0–1)
EOS ABS: 0 10*3/uL (ref 0.0–0.7)
Eosinophils Relative: 0 % (ref 0–5)
HCT: 43.8 % (ref 36.0–46.0)
Hemoglobin: 15.7 g/dL — ABNORMAL HIGH (ref 12.0–15.0)
LYMPHS PCT: 21 % (ref 12–46)
Lymphs Abs: 1.5 10*3/uL (ref 0.7–4.0)
MCH: 32.1 pg (ref 26.0–34.0)
MCHC: 35.8 g/dL (ref 30.0–36.0)
MCV: 89.6 fL (ref 78.0–100.0)
MONO ABS: 0.6 10*3/uL (ref 0.1–1.0)
Monocytes Relative: 9 % (ref 3–12)
NEUTROS PCT: 70 % (ref 43–77)
Neutro Abs: 4.8 10*3/uL (ref 1.7–7.7)
Platelets: 243 10*3/uL (ref 150–400)
RBC: 4.89 MIL/uL (ref 3.87–5.11)
RDW: 12.9 % (ref 11.5–15.5)
WBC: 6.9 10*3/uL (ref 4.0–10.5)

## 2014-10-13 LAB — URINE MICROSCOPIC-ADD ON

## 2014-10-13 LAB — URINALYSIS, ROUTINE W REFLEX MICROSCOPIC
BILIRUBIN URINE: NEGATIVE
Glucose, UA: NEGATIVE mg/dL
Hgb urine dipstick: NEGATIVE
Ketones, ur: >80 mg/dL — AB
Leukocytes, UA: NEGATIVE
NITRITE: NEGATIVE
PROTEIN: 30 mg/dL — AB
SPECIFIC GRAVITY, URINE: 1.016 (ref 1.005–1.030)
Urobilinogen, UA: 0.2 mg/dL (ref 0.0–1.0)
pH: 8 (ref 5.0–8.0)

## 2014-10-13 LAB — COMPREHENSIVE METABOLIC PANEL
ALT: 21 U/L (ref 14–54)
AST: 28 U/L (ref 15–41)
Albumin: 4.6 g/dL (ref 3.5–5.0)
Alkaline Phosphatase: 45 U/L (ref 38–126)
Anion gap: 15 (ref 5–15)
BILIRUBIN TOTAL: 0.9 mg/dL (ref 0.3–1.2)
BUN: 12 mg/dL (ref 6–20)
CALCIUM: 10.5 mg/dL — AB (ref 8.9–10.3)
CHLORIDE: 102 mmol/L (ref 101–111)
CO2: 20 mmol/L — ABNORMAL LOW (ref 22–32)
Creatinine, Ser: 0.79 mg/dL (ref 0.44–1.00)
GFR calc Af Amer: >60 mL/min (ref >60)
GLUCOSE: 102 mg/dL — AB (ref 65–99)
Potassium: 3.3 mmol/L — ABNORMAL LOW (ref 3.5–5.1)
Sodium: 137 mmol/L (ref 135–145)
Total Protein: 7.6 g/dL (ref 6.5–8.1)

## 2014-10-13 LAB — I-STAT TROPONIN, ED: Troponin i, poc: 0.03 ng/mL (ref 0.00–0.08)

## 2014-10-13 LAB — LIPASE, BLOOD: LIPASE: 17 U/L — AB (ref 22–51)

## 2014-10-13 MED ORDER — PANTOPRAZOLE SODIUM 40 MG IV SOLR
40.0000 mg | Freq: Once | INTRAVENOUS | Status: AC
  Administered 2014-10-13: 40 mg via INTRAVENOUS
  Filled 2014-10-13: qty 40

## 2014-10-13 MED ORDER — ONDANSETRON HCL 4 MG/2ML IJ SOLN
4.0000 mg | Freq: Once | INTRAMUSCULAR | Status: AC
  Administered 2014-10-13: 4 mg via INTRAVENOUS
  Filled 2014-10-13: qty 2

## 2014-10-13 MED ORDER — SODIUM CHLORIDE 0.9 % IV BOLUS (SEPSIS)
500.0000 mL | Freq: Once | INTRAVENOUS | Status: AC
  Administered 2014-10-13: 500 mL via INTRAVENOUS

## 2014-10-13 MED ORDER — IOHEXOL 300 MG/ML  SOLN
100.0000 mL | Freq: Once | INTRAMUSCULAR | Status: AC | PRN
  Administered 2014-10-13: 100 mL via INTRAVENOUS

## 2014-10-13 MED ORDER — ONDANSETRON 4 MG PO TBDP: 4.0000 mg | ORAL_TABLET | Freq: Three times a day (TID) | ORAL | Status: DC | PRN

## 2014-10-13 MED ORDER — SODIUM CHLORIDE 0.9 % IV SOLN
INTRAVENOUS | Status: DC
  Administered 2014-10-13: 17:00:00 via INTRAVENOUS

## 2014-10-13 NOTE — ED Notes (Signed)
Patient transported to CT 

## 2014-10-13 NOTE — Discharge Instructions (Signed)
Symptoms seem to be consistent with a gastritis with reflux. Would recommend not taking her Nexium back to full strength at least for the next 2 weeks. Follow-up with your regular doctor at least by phone. Continue to follow up with your neurologist in your doctors at [facility]. Return for any new or worse symptoms. Prescription for Zofran provided to help you with the nausea and vomiting.

## 2014-10-13 NOTE — ED Notes (Signed)
Pt here for altered mental status. sts was at her doctors office and didn't recognize him. sts alert but not to norm. sts being treated for migraines and has had facial droop in the past and left side deficit. Not today. sts HA. sts fragmented speech and abdominal pain. Paced on the monitor. 20 LAC. BP 144/94, HR 108, 98 RA. CBG 118

## 2014-10-13 NOTE — ED Provider Notes (Signed)
CSN: [id number]     Arrival date & time 10/13/14  1453 History   First MD Initiated Contact with Patient 10/13/14 1506     Chief Complaint  Patient presents with  . Altered Mental Status     (Consider location/radiation/quality/duration/timing/severity/associated sxs/prior Treatment) Patient is a 69 y.o. female presenting with altered mental status. The history is provided by the patient.  Altered Mental Status Presenting symptoms: confusion   Associated symptoms: abdominal pain, headaches, nausea and vomiting   Associated symptoms: no fever and no rash    patient sent in from [place]. For acute onset of some vomiting starting last evening with a lot of esophageal chest burning upper abdomen burning. Upper abdomen pain. Patient with a recent admission for what turned out to be a complicated migraine. Patient still suffering from some headaches. Patient seemed confused in the office as well. Patient was showing some signs of improvement following discharge until last evening. Patient's known to have a history of autonomic dysfunction. Followed by specialists at [facility].  Past Medical History  Diagnosis Date  . Diverticulosis   . GERD (gastroesophageal reflux disease)   . Esophageal stricture   . Hemorrhoids   . Mallory - Weiss tear     HEALED   . Interstitial cystitis   . ADHD (attention deficit hyperactivity disorder)   . Breast cancer     right side  . Blood transfusion   . Thyroid disease   . Allergy     trees/pollen, mold, fungus, dust mites. Takes allergy shots  . Latex allergy, contact dermatitis   . Cataract   . Depression   . Hernia of abdominal wall     spigelian hernia RLQ - SURGERY TO REPAIR  . H/O hiatal hernia   . Neuromuscular disorder     central nervous system neuropathy- seen per Dr Erling Cruz  . Asthma     allergist Dr Olena Heckle- monthly allergy injections  . Complication of anesthesia     reaction to some anesthetics/ 7/12 anesth record on chart- states  prefers epidural  . PONV (postoperative nausea and vomiting)     pt needs scop patch  . Recurrent upper respiratory infection (URI) 1/13- to present    bronchitis following surgery- states improved but still with cough. OV with [facility] Dr [name] 09/06/11 on chart  . Symptomatic bradycardia     a. 12/2012 s/p MDT dual chamber PPM, ser # [id number]; b. 12/2012 post-op course complicated by pericardial effusinon req lead revision.  . Pericardial effusion     a. 12/2012 following ppm placement;  b. 01/01/2013 Echo: EF 55-60%, small pericardial effusion w/o RV collapse-->No need for tap/window.  . Chest pain     a. 12/2012 Cath: nl cors, EF 55-65%.  . Autonomic dysfunction     CENTRAL NERVOUS SYSTEM NEUROPATHY - DX BY DR. Erling Cruz MORE THAN 10 YRS AGO - AND IT IS FELT TO CONTRIBUTE TO THE AUTOMIC  DYSFUNCTION-- PT HAS NUMBNESS LEGS AND FEET AND SOMETIIMES TIPS OF FINGER, SEVERE CONSTIPATION( NO SENSATION TO HAVE BM ), DOUBLE VISION, ORTHOSTATIC HYPOTENSION  . Hypothyroidism   . Bruises easily   . Pacemaker   . Dysrhythmia     HX OF HIGH GRADE HEART BLOCK - REQUIRED PACEMAKER INSERTION  . Shortness of breath     AT TIMES - BUT MUCH IMPROVED AFTER PACEMAKER WAS REPROGRAMED.  . Arthritis     PAIN AND OA LEFT HIP  . CHF (congestive heart failure)    Past Surgical History  Procedure Laterality Date  . Mastectomy modified radical      right; with immediate reconstruction  . Laparoscopy  1973  . Abdominal hysterectomy  1974  . Cystoscopy  1975, 2007  . Myringoplasty  1962  . Tympanoplasty  1973    right  . Oophorectomy  1982  . Bladder suspension    . Rectocele repair      with cystocele repair  . Total hip arthroplasty Right   . Ventral hernia repair  05/30/2011    Procedure: HERNIA REPAIR VENTRAL ADULT;  Surgeon: Haywood Lasso, MD;  Location: [place];  Service: General;  Laterality: Right;  repair right spigelian hernia  . Breast biopsy  2002    NO BLOOD PRESSURES ON  RIGHT SIDE/   s/p  axillary node dissection  . Tonsillectomy    . Back surgery      cervical fusion 4-5 with plate  . Cysto with hydrodistension  09/07/2011    Procedure: CYSTOSCOPY/HYDRODISTENSION;  Surgeon: Ailene Rud, MD;  Location: [facility];  Service: Urology;  Laterality: N/A;  INSTILLATION OF MARCAINE/PYRIDIUM INSTILLATION OF MARCAINE/KENALOG  . Pacemaker insertion    . Eye surgery      LASIK EYE SURGERY BILATERAL  . Ulnar nerve transposition Right   . Total hip arthroplasty Left 06/12/2013    Procedure: LEFT TOTAL HIP ARTHROPLASTY ANTERIOR APPROACH;  Surgeon: Mcarthur Rossetti, MD;  Location: [facility];  Service: Orthopedics;  Laterality: Left;  . Permanent pacemaker insertion N/A 12/29/2012    Procedure: PERMANENT PACEMAKER INSERTION;  Surgeon: Deboraha Sprang, MD;  Location[facility] LAB;  Service: Cardiovascular;  Laterality: N/A;  . Left heart catheterization with coronary angiogram N/A 12/29/2012    Procedure: LEFT HEART CATHETERIZATION WITH CORONARY ANGIOGRAM;  Surgeon: Peter M Martinique, MD;  Location: [facility] CATH LAB;  Service: Cardiovascular;  Laterality: N/A;  . Lead revision N/A 12/30/2012    Procedure: LEAD REVISION;  Surgeon: Evans Lance, MD;  Location: [facility] CATH LAB;  Service: Cardiovascular;  Laterality: N/A;   Family History  Problem Relation Age of Onset  . Heart disease Father   . Diabetes      aunt  . Uterine cancer      grandmother  . Colon cancer Mother   . Depression Mother   . Cancer Mother     colon  . Pancreatic cancer Sister   . Cancer Sister     pancreatic  . Cancer Maternal Grandmother     ovarian   History  Substance Use Topics  . Smoking status: Never Smoker   . Smokeless tobacco: Never Used  . Alcohol Use: 4.2 oz/week    7 Glasses of wine per week     Comment: nightly   OB History    No data available     Review of Systems  Constitutional: Negative for fever.  HENT: Negative for congestion.   Eyes: Negative for visual disturbance.   Respiratory: Negative for shortness of breath.   Cardiovascular: Positive for chest pain.  Gastrointestinal: Positive for nausea, vomiting and abdominal pain.  Genitourinary: Negative for dysuria.  Musculoskeletal: Negative for back pain.  Skin: Negative for rash.  Neurological: Positive for headaches.  Hematological: Does not bruise/bleed easily.  Psychiatric/Behavioral: Positive for confusion.      Allergies  Anesthetics, amide; Sulfonamide derivatives; Azithromycin; Bactroban; Ciprofloxacin; Codeine; Meperidine hcl; Morphine; Neosporin; Nitrofurantoin; Other; Penicillins; Tramadol; and Latex  Home Medications   Prior to Admission medications   Medication Sig Start Date  End Date Taking? Authorizing Provider  acetaminophen (TYLENOL) 325 MG tablet Take 2 tablets (650 mg total) by mouth every 6 (six) hours as needed for mild pain. 10/08/14  Yes Belkys A Regalado, MD  albuterol (PROVENTIL HFA;VENTOLIN HFA) 108 (90 BASE) MCG/ACT inhaler Inhale 2 puffs into the lungs every 6 (six) hours as needed. For shortness of breath   Yes Historical Provider, MD  ALPHA LIPOIC ACID PO Take 200 mg by mouth daily.   Yes Historical Provider, MD  aspirin EC 81 MG tablet Take 1 tablet (81 mg total) by mouth daily. 10/08/14  Yes Belkys A Regalado, MD  b complex vitamins capsule Take 1 capsule by mouth daily.    Yes Historical Provider, MD  budesonide (ENTOCORT EC) 3 MG 24 hr capsule Take 1 capsule (3 mg total) by mouth daily. 10/08/14  Yes Belkys A Regalado, MD  cholecalciferol (VITAMIN D) 1000 UNITS tablet Take 1,000 Units by mouth daily.   Yes Historical Provider, MD  cyanocobalamin 2000 MCG tablet Take 2,000 mcg by mouth daily.   Yes Historical Provider, MD  DULoxetine (CYMBALTA) 60 MG capsule Take 60 mg by mouth every morning.   Yes Historical Provider, MD  esomeprazole (NEXIUM) 40 MG capsule Take 20 mg by mouth daily.    Yes Historical Provider, MD  estradiol (ESTRING) 2 MG vaginal ring Place 2 mg  vaginally every 3 (three) months. follow package directions   Yes Historical Provider, MD  estradiol (VIVELLE-DOT) 0.025 MG/24HR Place 1 patch onto the skin 2 (two) times a week. Applies new patch on Sunday & Thursday   Yes Historical Provider, MD  fexofenadine (ALLEGRA) 180 MG tablet Take 180 mg by mouth daily.    Yes Historical Provider, MD  fish oil-omega-3 fatty acids 1000 MG capsule Take 1 g by mouth daily.    Yes Historical Provider, MD  levothyroxine (SYNTHROID, LEVOTHROID) 112 MCG tablet Take 112 mcg by mouth every morning.    Yes Historical Provider, MD  magnesium oxide (MAG-OX) 400 MG tablet Take 400 mg by mouth daily.   Yes Historical Provider, MD  midodrine (PROAMATINE) 10 MG tablet Take 1 tablet (10 mg total) by mouth 3 (three) times daily with meals. 10/08/14  Yes Belkys A Regalado, MD  montelukast (SINGULAIR) 10 MG tablet Take 10 mg by mouth as needed (allergies).    Yes Historical Provider, MD  potassium chloride (KLOR-CON M10) 10 MEQ tablet Take 1 tablet (10 mEq total) by mouth daily. 08/11/14  Yes Deboraha Sprang, MD  PRESCRIPTION MEDICATION ALLERGY SHOT 2 TIMES A WEEK BY DR. Neldon Mc   Yes Historical Provider, MD  Probiotic Product (ALIGN) 4 MG CAPS Take 4 mg by mouth at bedtime.    Yes Historical Provider, MD  ciclesonide (OMNARIS) 50 MCG/ACT nasal spray Place 2 sprays into both nostrils as needed for allergies.     Historical Provider, MD  docusate sodium (COLACE) 100 MG capsule Take 200 mg by mouth every evening.     Historical Provider, MD  ondansetron (ZOFRAN ODT) 4 MG disintegrating tablet Take 1 tablet (4 mg total) by mouth every 8 (eight) hours as needed for nausea or vomiting. 10/13/14   Fredia Sorrow, MD   BP 150/84 mmHg  Pulse 84  Temp(Src) 98.3 F (36.8 C) (Oral)  Resp 20  SpO2 99% Physical Exam  Constitutional: She is oriented to person, place, and time. She appears well-developed and well-nourished. She appears distressed.  HENT:  Head: Normocephalic and  atraumatic.  Mouth/Throat: Oropharynx is clear and  moist.  Eyes: Conjunctivae are normal. Pupils are equal, round, and reactive to light.  Neck: Normal range of motion. Neck supple.  Cardiovascular: Normal rate, regular rhythm and normal heart sounds.   No murmur heard. Pulmonary/Chest: Effort normal and breath sounds normal.  Abdominal: Soft. Bowel sounds are normal. There is no tenderness.  Musculoskeletal: Normal range of motion.  Neurological: She is alert and oriented to person, place, and time. No cranial nerve deficit. She exhibits normal muscle tone. Coordination normal.  No focal deficit. Patient was some memory recall problems initially.  Skin: Skin is warm. No rash noted.  Nursing note and vitals reviewed.   ED Course  Procedures (including critical care time) Labs Review Labs Reviewed  CBC WITH DIFFERENTIAL/PLATELET - Abnormal; Notable for the following:    Hemoglobin 15.7 (*)    All other components within normal limits  COMPREHENSIVE METABOLIC PANEL - Abnormal; Notable for the following:    Potassium 3.3 (*)    CO2 20 (*)    Glucose, Bld 102 (*)    Calcium 10.5 (*)    All other components within normal limits  LIPASE, BLOOD - Abnormal; Notable for the following:    Lipase 17 (*)    All other components within normal limits  URINALYSIS, ROUTINE W REFLEX MICROSCOPIC (NOT AT [facility]) - Abnormal; Notable for the following:    APPearance CLOUDY (*)    Ketones, ur >80 (*)    Protein, ur 30 (*)    All other components within normal limits  URINE MICROSCOPIC-ADD ON - Abnormal; Notable for the following:    Squamous Epithelial / LPF MANY (*)    Bacteria, UA FEW (*)    All other components within normal limits  I-STAT CHEM 8, ED - Abnormal; Notable for the following:    Potassium 3.3 (*)    Glucose, Bld 102 (*)    Hemoglobin 16.3 (*)    HCT 48.0 (*)    All other components within normal limits  I-STAT TROPOININ, ED   Results for orders placed or performed during the  hospital encounter of 10/13/14  CBC with Differential/Platelet  Result Value Ref Range   WBC 6.9 4.0 - 10.5 K/uL   RBC 4.89 3.87 - 5.11 MIL/uL   Hemoglobin 15.7 (H) 12.0 - 15.0 g/dL   HCT 43.8 36.0 - 46.0 %   MCV 89.6 78.0 - 100.0 fL   MCH 32.1 26.0 - 34.0 pg   MCHC 35.8 30.0 - 36.0 g/dL   RDW 12.9 11.5 - 15.5 %   Platelets 243 150 - 400 K/uL   Neutrophils Relative % 70 43 - 77 %   Neutro Abs 4.8 1.7 - 7.7 K/uL   Lymphocytes Relative 21 12 - 46 %   Lymphs Abs 1.5 0.7 - 4.0 K/uL   Monocytes Relative 9 3 - 12 %   Monocytes Absolute 0.6 0.1 - 1.0 K/uL   Eosinophils Relative 0 0 - 5 %   Eosinophils Absolute 0.0 0.0 - 0.7 K/uL   Basophils Relative 0 0 - 1 %   Basophils Absolute 0.0 0.0 - 0.1 K/uL  Comprehensive metabolic panel  Result Value Ref Range   Sodium 137 135 - 145 mmol/L   Potassium 3.3 (L) 3.5 - 5.1 mmol/L   Chloride 102 101 - 111 mmol/L   CO2 20 (L) 22 - 32 mmol/L   Glucose, Bld 102 (H) 65 - 99 mg/dL   BUN 12 6 - 20 mg/dL   Creatinine, Ser 0.79 0.44 -  1.00 mg/dL   Calcium 10.5 (H) 8.9 - 10.3 mg/dL   Total Protein 7.6 6.5 - 8.1 g/dL   Albumin 4.6 3.5 - 5.0 g/dL   AST 28 15 - 41 U/L   ALT 21 14 - 54 U/L   Alkaline Phosphatase 45 38 - 126 U/L   Total Bilirubin 0.9 0.3 - 1.2 mg/dL   GFR calc non Af Amer >60 >60 mL/min   GFR calc Af Amer >60 >60 mL/min   Anion gap 15 5 - 15  Lipase, blood  Result Value Ref Range   Lipase 17 (L) 22 - 51 U/L  Urinalysis, Routine w reflex microscopic (not at [facility])  Result Value Ref Range   Color, Urine YELLOW YELLOW   APPearance CLOUDY (A) CLEAR   Specific Gravity, Urine 1.016 1.005 - 1.030   pH 8.0 5.0 - 8.0   Glucose, UA NEGATIVE NEGATIVE mg/dL   Hgb urine dipstick NEGATIVE NEGATIVE   Bilirubin Urine NEGATIVE NEGATIVE   Ketones, ur >80 (A) NEGATIVE mg/dL   Protein, ur 30 (A) NEGATIVE mg/dL   Urobilinogen, UA 0.2 0.0 - 1.0 mg/dL   Nitrite NEGATIVE NEGATIVE   Leukocytes, UA NEGATIVE NEGATIVE  Urine microscopic-add on  Result  Value Ref Range   Squamous Epithelial / LPF MANY (A) RARE   WBC, UA 0-2 <3 WBC/hpf   RBC / HPF 0-2 <3 RBC/hpf   Bacteria, UA FEW (A) RARE  I-Stat Troponin, ED (not at [facility])  Result Value Ref Range   Troponin i, poc 0.03 0.00 - 0.08 ng/mL   Comment 3          I-Stat Chem 8, ED  Result Value Ref Range   Sodium 138 135 - 145 mmol/L   Potassium 3.3 (L) 3.5 - 5.1 mmol/L   Chloride 103 101 - 111 mmol/L   BUN 12 6 - 20 mg/dL   Creatinine, Ser 0.60 0.44 - 1.00 mg/dL   Glucose, Bld 102 (H) 65 - 99 mg/dL   Calcium, Ion 1.20 1.13 - 1.30 mmol/L   TCO2 18 0 - 100 mmol/L   Hemoglobin 16.3 (H) 12.0 - 15.0 g/dL   HCT 48.0 (H) 36.0 - 46.0 %     Imaging Review Ct Abdomen Pelvis W Contrast  10/13/2014   CLINICAL DATA:  Abdominal pain localizing to the epigastric region. History of prior hysterectomy and ventral hernia repair.  EXAM: CT ABDOMEN AND PELVIS WITH CONTRAST  TECHNIQUE: Multidetector CT imaging of the abdomen and pelvis was performed using the standard protocol following bolus administration of intravenous contrast.  CONTRAST:  132mL OMNIPAQUE IOHEXOL 300 MG/ML  SOLN  COMPARISON:  04/12/2011  FINDINGS: The liver, gallbladder, pancreas, spleen and adrenal glands are within normal limits. 3 mm upper pole and 3 mm mid collecting system calculi identified in the left kidney without evidence of hydronephrosis. 3 mm upper pole calculus is present in the right kidney without hydronephrosis.  Bowel shows no evidence of obstruction or inflammation. No free air, free fluid, abscess, mass or lymphadenopathy is identified. Previously identified right lower quadrant abdominal wall hernia has been repaired and there is no evidence of recurrent hernia. The bladder is decompressed and largely shattered by artifact from bilateral hip prostheses. Pessary present. Bony structures show mild spondylosis of the lumbar spine. No fractures or bony lesions are seen. No vascular abnormalities are identified.  IMPRESSION:  Nonobstructing bilateral renal calculi. No acute findings in the abdomen or pelvis.   Electronically Signed   By: Aletta Edouard  M.D.   On: 10/13/2014 19:03   Dg Abd Acute W/chest  10/13/2014   CLINICAL DATA:  69 year old female with altered mental status and abdominal pain  EXAM: DG ABDOMEN ACUTE W/ 1V CHEST  COMPARISON:  Prior chest x-ray 10/07/2014  FINDINGS: Left subclavian approach cardiac rhythm maintenance device. Leads project over the right atrium and right ventricle. Cardiac and mediastinal contours remain unchanged. The lungs are clear. No focal airspace consolidation, pulmonary edema, pleural effusion or pneumothorax. No evidence of free air under the diaphragm on the up right or lateral decubitus view. Surgical changes of prior right mastectomy and axillary nodal dissection. The bowel gas pattern is not obstructed. No evidence of ascites. 1.7 cm round calcification overlying the bladder favored to represent a bladder stone. Bilateral hip arthroplasties. No acute osseous abnormality.  IMPRESSION: 1. No acute cardiopulmonary process. 2. Nonobstructed bowel gas pattern. No evidence of free air or ascites. 3. Suspect 1.7 cm bladder stone.   Electronically Signed   By: Jacqulynn Cadet M.D.   On: 10/13/2014 16:29     EKG Interpretation None      MDM   Final diagnoses:  Epigastric abdominal pain  Gastritis    Patient followed by [facility] medical. Patient recent admission for what ended up being diagnosed with complicated migraine. She is still having some difficulties with that. Patient known to have autonomic dysfunction for several years. Some questions whether that is getting worse. Symptoms here today seemed to be a stent with a gastritis may be some component of gastroparesis may be some reflux disease.   Improved here significantly with Protonix and IV fluids. Labs without significant abnormalities. No evidence of an acute abdominal process. If symptoms persist patient may require  an upper endoscopy for further evaluation.   Patient's mental confusion has resolved. Thinking that may still be related to the complicated migraine that she's been suffering with now for a while.   Fredia Sorrow, MD 10/13/14 2008

## 2014-10-14 ENCOUNTER — Ambulatory Visit (INDEPENDENT_AMBULATORY_CARE_PROVIDER_SITE_OTHER): Payer: Medicare Other | Admitting: *Deleted

## 2014-10-14 DIAGNOSIS — R000 Tachycardia, unspecified: Secondary | ICD-10-CM

## 2014-10-14 NOTE — Progress Notes (Signed)
Remote pacemaker transmission.   

## 2014-10-15 DIAGNOSIS — J45909 Unspecified asthma, uncomplicated: Secondary | ICD-10-CM | POA: Diagnosis not present

## 2014-10-15 DIAGNOSIS — Z6822 Body mass index (BMI) 22.0-22.9, adult: Secondary | ICD-10-CM | POA: Diagnosis not present

## 2014-10-15 DIAGNOSIS — G909 Disorder of the autonomic nervous system, unspecified: Secondary | ICD-10-CM | POA: Diagnosis not present

## 2014-10-15 DIAGNOSIS — F329 Major depressive disorder, single episode, unspecified: Secondary | ICD-10-CM | POA: Diagnosis not present

## 2014-10-15 DIAGNOSIS — K219 Gastro-esophageal reflux disease without esophagitis: Secondary | ICD-10-CM | POA: Diagnosis not present

## 2014-10-15 DIAGNOSIS — R51 Headache: Secondary | ICD-10-CM | POA: Diagnosis not present

## 2014-10-15 DIAGNOSIS — I480 Paroxysmal atrial fibrillation: Secondary | ICD-10-CM | POA: Diagnosis not present

## 2014-10-19 LAB — CUP PACEART REMOTE DEVICE CHECK
Battery Remaining Longevity: 93 mo
Brady Statistic AP VP Percent: 1.85 %
Brady Statistic AS VP Percent: 98.13 %
Brady Statistic AS VS Percent: 0.02 %
Brady Statistic RA Percent Paced: 1.85 %
Brady Statistic RV Percent Paced: 99.98 %
Date Time Interrogation Session: 20160531131047
Lead Channel Impedance Value: 342 Ohm
Lead Channel Impedance Value: 456 Ohm
Lead Channel Impedance Value: 627 Ohm
Lead Channel Pacing Threshold Amplitude: 0.75 V
Lead Channel Pacing Threshold Pulse Width: 0.4 ms
Lead Channel Pacing Threshold Pulse Width: 0.4 ms
Lead Channel Sensing Intrinsic Amplitude: 15.125 mV
Lead Channel Sensing Intrinsic Amplitude: 15.125 mV
Lead Channel Sensing Intrinsic Amplitude: 2 mV
Lead Channel Sensing Intrinsic Amplitude: 2 mV
Lead Channel Setting Pacing Amplitude: 2.25 V
Lead Channel Setting Pacing Amplitude: 2.5 V
Lead Channel Setting Sensing Sensitivity: 0.9 mV
MDC IDC MSMT BATTERY VOLTAGE: 3.01 V
MDC IDC MSMT LEADCHNL RA PACING THRESHOLD AMPLITUDE: 1.125 V
MDC IDC MSMT LEADCHNL RV IMPEDANCE VALUE: 741 Ohm
MDC IDC SET LEADCHNL RV PACING PULSEWIDTH: 0.4 ms
MDC IDC STAT BRADY AP VS PERCENT: 0 %
Zone Setting Detection Interval: 400 ms
Zone Setting Detection Interval: 400 ms

## 2014-10-25 ENCOUNTER — Encounter: Payer: Self-pay | Admitting: Neurology

## 2014-10-25 ENCOUNTER — Encounter: Payer: Self-pay | Admitting: Internal Medicine

## 2014-10-25 ENCOUNTER — Ambulatory Visit (INDEPENDENT_AMBULATORY_CARE_PROVIDER_SITE_OTHER): Payer: Medicare Other | Admitting: Neurology

## 2014-10-25 VITALS — BP 118/73 | HR 71 | Ht 67.0 in | Wt 142.4 lb

## 2014-10-25 DIAGNOSIS — J309 Allergic rhinitis, unspecified: Secondary | ICD-10-CM | POA: Diagnosis not present

## 2014-10-25 DIAGNOSIS — R4182 Altered mental status, unspecified: Secondary | ICD-10-CM | POA: Insufficient documentation

## 2014-10-25 DIAGNOSIS — R404 Transient alteration of awareness: Secondary | ICD-10-CM

## 2014-10-25 DIAGNOSIS — G441 Vascular headache, not elsewhere classified: Secondary | ICD-10-CM | POA: Diagnosis not present

## 2014-10-25 DIAGNOSIS — I639 Cerebral infarction, unspecified: Secondary | ICD-10-CM | POA: Diagnosis not present

## 2014-10-25 NOTE — Patient Instructions (Signed)
Stroke Prevention Some medical conditions and behaviors are associated with an increased chance of having a stroke. You may prevent a stroke by making healthy choices and managing medical conditions. HOW CAN I REDUCE MY RISK OF HAVING A STROKE?   Stay physically active. Get at least 30 minutes of activity on most or all days.  Do not smoke. It may also be helpful to avoid exposure to secondhand smoke.  Limit alcohol use. Moderate alcohol use is considered to be:  No more than 2 drinks per day for men.  No more than 1 drink per day for nonpregnant women.  Eat healthy foods. This involves:  Eating 5 or more servings of fruits and vegetables a day.  Making dietary changes that address high blood pressure (hypertension), high cholesterol, diabetes, or obesity.  Manage your cholesterol levels.  Making food choices that are high in fiber and low in saturated fat, trans fat, and cholesterol may control cholesterol levels.  Take any prescribed medicines to control cholesterol as directed by your health care provider.  Manage your diabetes.  Controlling your carbohydrate and sugar intake is recommended to manage diabetes.  Take any prescribed medicines to control diabetes as directed by your health care provider.  Control your hypertension.  Making food choices that are low in salt (sodium), saturated fat, trans fat, and cholesterol is recommended to manage hypertension.  Take any prescribed medicines to control hypertension as directed by your health care provider.  Maintain a healthy weight.  Reducing calorie intake and making food choices that are low in sodium, saturated fat, trans fat, and cholesterol are recommended to manage weight.  Stop drug abuse.  Avoid taking birth control pills.  Talk to your health care provider about the risks of taking birth control pills if you are over 35 years old, smoke, get migraines, or have ever had a blood clot.  Get evaluated for sleep  disorders (sleep apnea).  Talk to your health care provider about getting a sleep evaluation if you snore a lot or have excessive sleepiness.  Take medicines only as directed by your health care provider.  For some people, aspirin or blood thinners (anticoagulants) are helpful in reducing the risk of forming abnormal blood clots that can lead to stroke. If you have the irregular heart rhythm of atrial fibrillation, you should be on a blood thinner unless there is a good reason you cannot take them.  Understand all your medicine instructions.  Make sure that other conditions (such as anemia or atherosclerosis) are addressed. SEEK IMMEDIATE MEDICAL CARE IF:   You have sudden weakness or numbness of the face, arm, or leg, especially on one side of the body.  Your face or eyelid droops to one side.  You have sudden confusion.  You have trouble speaking (aphasia) or understanding.  You have sudden trouble seeing in one or both eyes.  You have sudden trouble walking.  You have dizziness.  You have a loss of balance or coordination.  You have a sudden, severe headache with no known cause.  You have new chest pain or an irregular heartbeat. Any of these symptoms may represent a serious problem that is an emergency. Do not wait to see if the symptoms will go away. Get medical help at once. Call your local emergency services (911 in [place] Do not drive yourself to the hospital. Document Released: 06/07/2004 Document Revised: 09/14/2013 Document Reviewed: 10/31/2012 [employer] Patient Information 2015 [employer]. This information is not intended to replace advice given   to you by your health care provider. Make sure you discuss any questions you have with your health care provider.  

## 2014-10-25 NOTE — Progress Notes (Signed)
Reason for visit: Complicated migraine  Referring physician: Dr. Cindra Presume is a 69 y.o. female  History of present illness:  Ms. Johal is a 69 year old right-handed white female with a history of a recent admission to the hospital that occurred on 10/06/2014. The patient presented with a sudden onset headache that occurred after a sneeze. The patient had a sharp jabbing pain that went from the left side of the back of the head forward. The patient then developed some weakness sensations on the left side of the face and head, and left arm and leg weakness. The patient had some word finding problems and garbled speech. She has had an alteration in her ability to walk. She went to the hospital for further evaluation. MRI of the brain did not confirm an acute stroke. The patient underwent a 2-D echocardiogram that was unremarkable, and a CT angiogram of the head and neck was unremarkable. The patient was treated with aspirin therapy. She has continued to have headaches off and on that are not severe, but are essentially daily in nature. The patient takes Tylenol for the headache with some benefit. Prior to this recent admission, headaches were rare for this patient. On 10/13/2014, the patient developed a gastroenteritis issue, he became confused with this and she went to the emergency room for further evaluation. The patient has resolved with the confusion, she was felt to be dehydrated. The patient has ongoing issues with autonomic dysfunction, gastroparesis, and orthostatic hypotension. She was seen years ago by Dr. Erling Cruz, and she was evaluated for possible demyelinating disease. This diagnosis was never confirmed. The patient does have some underlying psychiatric issues, and she is followed by Dr. Toy Care. The patient has had a recent MRI the brain that shows mild nonspecific white matter changes, no acute changes were seen. She is having ongoing issues with sleeping at night, only  able to sleep about 2 hours of the time. She does report some ongoing left-sided weakness, balance issues following the recent hospitalization. She is sent to this office for further evaluation.  Past Medical History  Diagnosis Date  . Diverticulosis   . GERD (gastroesophageal reflux disease)   . Esophageal stricture   . Hemorrhoids   . Mallory - Weiss tear     HEALED   . Interstitial cystitis   . ADHD (attention deficit hyperactivity disorder)   . Breast cancer     right side  . Blood transfusion   . Thyroid disease   . Allergy     trees/pollen, mold, fungus, dust mites. Takes allergy shots  . Latex allergy, contact dermatitis   . Cataract   . Depression   . Hernia of abdominal wall     spigelian hernia RLQ - SURGERY TO REPAIR  . H/O hiatal hernia   . Neuromuscular disorder     central nervous system neuropathy- seen per Dr Erling Cruz  . Asthma     allergist Dr Olena Heckle- monthly allergy injections  . Complication of anesthesia     reaction to some anesthetics/ 7/12 anesth record on chart- states prefers epidural  . PONV (postoperative nausea and vomiting)     pt needs scop patch  . Recurrent upper respiratory infection (URI) 1/13- to present    bronchitis following surgery- states improved but still with cough. OV with [facility] Dr [name] 09/06/11 on chart  . Symptomatic bradycardia     a. 12/2012 s/p MDT dual chamber PPM, ser # [id number]; b. 12/2012 post-op course complicated by  pericardial effusinon req lead revision.  . Pericardial effusion     a. 12/2012 following ppm placement;  b. 01/01/2013 Echo: EF 55-60%, small pericardial effusion w/o RV collapse-->No need for tap/window.  . Chest pain     a. 12/2012 Cath: nl cors, EF 55-65%.  . Autonomic dysfunction     CENTRAL NERVOUS SYSTEM NEUROPATHY - DX BY DR. Erling Cruz MORE THAN 10 YRS AGO - AND IT IS FELT TO CONTRIBUTE TO THE AUTOMIC  DYSFUNCTION-- PT HAS NUMBNESS LEGS AND FEET AND SOMETIIMES TIPS OF FINGER, SEVERE CONSTIPATION( NO SENSATION  TO HAVE BM ), DOUBLE VISION, ORTHOSTATIC HYPOTENSION  . Hypothyroidism   . Bruises easily   . Pacemaker   . Dysrhythmia     HX OF HIGH GRADE HEART BLOCK - REQUIRED PACEMAKER INSERTION  . Shortness of breath     AT TIMES - BUT MUCH IMPROVED AFTER PACEMAKER WAS REPROGRAMED.  . Arthritis     PAIN AND OA LEFT HIP  . CHF (congestive heart failure)   . Gastritis     Past Surgical History  Procedure Laterality Date  . Mastectomy modified radical      right; with immediate reconstruction  . Laparoscopy  1973  . Abdominal hysterectomy  1974  . Cystoscopy  1975, 2007  . Myringoplasty  1962  . Tympanoplasty  1973    right  . Oophorectomy  1982  . Bladder suspension    . Rectocele repair      with cystocele repair  . Total hip arthroplasty Right   . Ventral hernia repair  05/30/2011    Procedure: HERNIA REPAIR VENTRAL ADULT;  Surgeon: Haywood Lasso, MD;  Location: [facility];  Service: General;  Laterality: Right;  repair right spigelian hernia  . Breast biopsy  2002    NO BLOOD PRESSURES ON RIGHT SIDE/   s/p  axillary node dissection  . Tonsillectomy    . Back surgery      cervical fusion 4-5 with plate  . Cysto with hydrodistension  09/07/2011    Procedure: CYSTOSCOPY/HYDRODISTENSION;  Surgeon: Ailene Rud, MD;  Location: [facility];  Service: Urology;  Laterality: N/A;  INSTILLATION OF MARCAINE/PYRIDIUM INSTILLATION OF MARCAINE/KENALOG  . Pacemaker insertion    . Eye surgery      LASIK EYE SURGERY BILATERAL  . Ulnar nerve transposition Right   . Total hip arthroplasty Left 06/12/2013    Procedure: LEFT TOTAL HIP ARTHROPLASTY ANTERIOR APPROACH;  Surgeon: Mcarthur Rossetti, MD;  Location: [facility];  Service: Orthopedics;  Laterality: Left;  . Permanent pacemaker insertion N/A 12/29/2012    Procedure: PERMANENT PACEMAKER INSERTION;  Surgeon: Deboraha Sprang, MD;  Location: [facility] C[facility] LAB;  Service: Cardiovascular;  Laterality: N/A;  . Left heart catheterization  with coronary angiogram N/A 12/29/2012    Procedure: LEFT HEART CATHETERIZATION WITH CORONARY ANGIOGRAM;  Surgeon: Peter M Martinique, MD;  Location: [facility] CATH LAB;  Service: Cardiovascular;  Laterality: N/A;  . Lead revision N/A 12/30/2012    Procedure: LEAD REVISION;  Surgeon: Evans Lance, MD;  Location: [facility] CATH LAB;  Service: Cardiovascular;  Laterality: N/A;  . Neck surgery      c4-5 ruptured disc    Family History  Problem Relation Age of Onset  . Heart disease Father   . Diabetes      aunt  . Uterine cancer      grandmother  . Colon cancer Mother   . Depression Mother   . Cancer Mother  colon  . Pancreatic cancer Sister   . Cancer Sister     pancreatic  . Diverticulitis Sister   . Cancer Maternal Grandmother     ovarian  . Heart disease Brother   . Heart disease Paternal Grandmother   . Heart disease Paternal Grandfather     Social history:  reports that she has never smoked. She has never used smokeless tobacco. She reports that she drinks alcohol. She reports that she does not use illicit drugs.  Medications:  Prior to Admission medications   Medication Sig Start Date End Date Taking? Authorizing Provider  acetaminophen (TYLENOL) 325 MG tablet Take 2 tablets (650 mg total) by mouth every 6 (six) hours as needed for mild pain. 10/08/14  Yes Belkys A Regalado, MD  albuterol (PROVENTIL HFA;VENTOLIN HFA) 108 (90 BASE) MCG/ACT inhaler Inhale 2 puffs into the lungs every 6 (six) hours as needed. For shortness of breath   Yes Historical Provider, MD  ALPHA LIPOIC ACID PO Take 200 mg by mouth daily.   Yes Historical Provider, MD  aspirin EC 81 MG tablet Take 1 tablet (81 mg total) by mouth daily. 10/08/14  Yes Belkys A Regalado, MD  b complex vitamins capsule Take 1 capsule by mouth daily.    Yes Historical Provider, MD  cholecalciferol (VITAMIN D) 1000 UNITS tablet Take 1,000 Units by mouth daily.   Yes Historical Provider, MD  ciclesonide (OMNARIS) 50 MCG/ACT nasal spray Place  2 sprays into both nostrils as needed for allergies.    Yes Historical Provider, MD  cyanocobalamin 2000 MCG tablet Take 2,000 mcg by mouth daily.   Yes Historical Provider, MD  docusate sodium (COLACE) 100 MG capsule Take 200 mg by mouth every evening.    Yes Historical Provider, MD  escitalopram (LEXAPRO) 10 MG tablet Take 10 mg by mouth daily.   Yes Historical Provider, MD  esomeprazole (NEXIUM) 40 MG capsule Take 20 mg by mouth daily.    Yes Historical Provider, MD  estradiol (ESTRING) 2 MG vaginal ring Place 2 mg vaginally every 3 (three) months. follow package directions   Yes Historical Provider, MD  estradiol (VIVELLE-DOT) 0.025 MG/24HR Place 1 patch onto the skin 2 (two) times a week. Applies new patch on Sunday & Thursday   Yes Historical Provider, MD  fexofenadine (ALLEGRA) 180 MG tablet Take 180 mg by mouth daily.    Yes Historical Provider, MD  fish oil-omega-3 fatty acids 1000 MG capsule Take 1 g by mouth daily.    Yes Historical Provider, MD  levothyroxine (SYNTHROID, LEVOTHROID) 112 MCG tablet Take 112 mcg by mouth every morning.    Yes Historical Provider, MD  magnesium oxide (MAG-OX) 400 MG tablet Take 400 mg by mouth daily.   Yes Historical Provider, MD  midodrine (PROAMATINE) 10 MG tablet Take 1 tablet (10 mg total) by mouth 3 (three) times daily with meals. 10/08/14  Yes Belkys A Regalado, MD  montelukast (SINGULAIR) 10 MG tablet Take 10 mg by mouth as needed (allergies).    Yes Historical Provider, MD  ondansetron (ZOFRAN ODT) 4 MG disintegrating tablet Take 1 tablet (4 mg total) by mouth every 8 (eight) hours as needed for nausea or vomiting. 10/13/14  Yes Fredia Sorrow, MD  potassium chloride (KLOR-CON M10) 10 MEQ tablet Take 1 tablet (10 mEq total) by mouth daily. 08/11/14  Yes Deboraha Sprang, MD  PRESCRIPTION MEDICATION ALLERGY SHOT 2 TIMES A WEEK BY DR. Neldon Mc   Yes Historical Provider, MD  Probiotic Product (Neihart) 4  MG CAPS Take 4 mg by mouth at bedtime.    Yes Historical  Provider, MD      Allergies  Allergen Reactions  . Anesthetics, Amide Other (See Comments)    Patient unsure of names, however multiples cause swelling of airway & nausea  . Sulfonamide Derivatives Anaphylaxis and Swelling  . Azithromycin Other (See Comments)    Severe gastritis   . Bactroban Other (See Comments)    Causes sores in nose  . Ciprofloxacin     REACTION: joint swelling  . Codeine Swelling  . Meperidine Hcl Nausea Only    Hallucinations   . Morphine Nausea Only    Hallucinations   . Neosporin [Neomycin-Polymyxin-Gramicidin] Hives and Dermatitis    All topical "orin's ointment"  . Nitrofurantoin     REACTION: neuropathy in legs  . Other     ALLERGIC TO WARM WATER SHELLFISH  PT STATES PLEASE NOTE SHE CAN TAKE OXYCODONE AND TYLENOL FOR PAIN -NOT ALLERGIC  . Penicillins Other (See Comments)    Swelling in joints  . Tramadol     Makes jerk  . Latex Rash    ROS:  Out of a complete 14 system review of symptoms, the patient complains only of the following symptoms, and all other reviewed systems are negative.  Chills, weight loss, fatigue Palpitations of the heart Hearing changes, ringing in the ears, dizziness Blurred vision, double vision Shortness of breath Diarrhea, constipation Urination problems Easy bruising, feeling cold Skin allergies, runny nose Confusion, weakness, slurred speech, difficulty swallowing, dizziness, tremor Dizziness, anxiety, not enough sleep, decreased energy, disinterest in activities, racing thoughts Insomnia  Blood pressure 118/73, pulse 71, height 5\' 7"  (1.702 m), weight 142 lb 6.4 oz (64.592 kg).  Physical Exam  General: The patient is alert and cooperative at the time of the examination.  Eyes: Pupils are equal, round, and reactive to light. Discs are flat bilaterally.  Neck: The neck is supple, no carotid bruits are noted.  Respiratory: The respiratory examination is clear, with exception of occasional  wheezes.  Cardiovascular: The cardiovascular examination reveals a regular rate and rhythm, no obvious murmurs or rubs are noted.  Skin: Extremities are without significant edema.  Neurologic Exam  Mental status: The patient is alert and oriented x 3 at the time of the examination. The patient has apparent normal recent and remote memory, with an apparently normal attention span and concentration ability.  Cranial nerves: Facial symmetry is present. There is good sensation of the face to pinprick and soft touch on the right, decreased on the left forehead. The patient splits the forehead with vibration sensation, decreased on the left. The strength of the facial muscles and the muscles to head turning and shoulder shrug are normal bilaterally. Speech is well enunciated, no aphasia or dysarthria is noted. Extraocular movements are full. Visual fields are full. The tongue is midline, and the patient has symmetric elevation of the soft palate. No obvious hearing deficits are noted.  Motor: The motor testing reveals 5 over 5 strength of all 4 extremities. Good symmetric motor tone is noted throughout.  Sensory: Sensory testing is intact to pinprick, soft touch, vibration sensation, and position sense on all 4 extremities, with exception that there is some decrease in pinprick sensation on the left arm and left leg relative to the right, decreased vibration sensation on the left foot relative to the right. No evidence of extinction is noted.  Coordination: Cerebellar testing reveals good finger-nose-finger and heel-to-shin bilaterally.  Gait and station:  Gait is normal. Tandem gait is minimally unsteady. Romberg is negative. No drift is seen.  Reflexes: Deep tendon reflexes are symmetric and normal bilaterally. Toes are downgoing bilaterally.   MRI brain 10/07/14:  IMPRESSION: No acute infarct.  Mild to slightly moderate small vessel disease type changes.  Global mild atrophy.  * MRI scan  images were reviewed online. I agree with the written report.   2D echo 10/07/14:  Study Conclusions  - Left ventricle: Mild septal dyssynergy from pacing. The cavity size was normal. Wall thickness was normal. The estimated ejection fraction was 55%. - Right ventricle: The cavity size was normal. Systolic function was normal. - Impressions: No cardiac source of embolism was identified, but cannot be ruled out on the basis of this examination.  Impressions:  - No cardiac source of embolism was identified, but cannot be ruled out on the basis of this examination.   CTA head and neck 10/07/14:  IMPRESSION: Normal CTA of the head and neck. No significant atherosclerotic disease.  No acute intracranial abnormality.    Assessment/Plan:  1. Recent onset of headache, left-sided weakness  2. Recent episode of transient confusion  3. Headache  4. Nonorganic neurologic examination  The patient appears to have new symptoms of headache, and left-sided symptoms. Clinical examination suggests nonorganic features, the patient splits the midline with vibration patient on the forehead. MRI of the brain did not show acute stroke changes. The patient will undergo an EEG study given the episode of recent confusion. She is to remain on aspirin. Will follow-up in 2 or 3 months. The patient will contact her office if new symptoms arise. If the headache continues, the medications may need to be added for treatment.  [name] Alexanders MD 10/25/2014 7:27 PM  [facility] [address] [place], [place] [address]  Phone [phone] Fax [phone]

## 2014-10-27 DIAGNOSIS — Z6822 Body mass index (BMI) 22.0-22.9, adult: Secondary | ICD-10-CM | POA: Diagnosis not present

## 2014-10-27 DIAGNOSIS — R51 Headache: Secondary | ICD-10-CM | POA: Diagnosis not present

## 2014-10-27 DIAGNOSIS — I480 Paroxysmal atrial fibrillation: Secondary | ICD-10-CM | POA: Diagnosis not present

## 2014-10-27 DIAGNOSIS — F909 Attention-deficit hyperactivity disorder, unspecified type: Secondary | ICD-10-CM | POA: Diagnosis not present

## 2014-10-27 DIAGNOSIS — J45909 Unspecified asthma, uncomplicated: Secondary | ICD-10-CM | POA: Diagnosis not present

## 2014-10-27 DIAGNOSIS — G909 Disorder of the autonomic nervous system, unspecified: Secondary | ICD-10-CM | POA: Diagnosis not present

## 2014-10-27 DIAGNOSIS — F329 Major depressive disorder, single episode, unspecified: Secondary | ICD-10-CM | POA: Diagnosis not present

## 2014-11-01 ENCOUNTER — Ambulatory Visit (INDEPENDENT_AMBULATORY_CARE_PROVIDER_SITE_OTHER): Payer: Medicare Other | Admitting: Neurology

## 2014-11-01 DIAGNOSIS — R404 Transient alteration of awareness: Secondary | ICD-10-CM

## 2014-11-01 DIAGNOSIS — G441 Vascular headache, not elsewhere classified: Secondary | ICD-10-CM

## 2014-11-03 ENCOUNTER — Encounter: Payer: Self-pay | Admitting: Cardiology

## 2014-11-05 DIAGNOSIS — K5900 Constipation, unspecified: Secondary | ICD-10-CM | POA: Diagnosis not present

## 2014-11-05 DIAGNOSIS — G903 Multi-system degeneration of the autonomic nervous system: Secondary | ICD-10-CM | POA: Diagnosis not present

## 2014-11-08 ENCOUNTER — Telehealth: Payer: Self-pay | Admitting: Neurology

## 2014-11-08 NOTE — Telephone Encounter (Signed)
Patient called wanting to follow up on her results from her EEG on 11/01/14. Please call and advise. Patient can be reached @ [phone]

## 2014-11-08 NOTE — Telephone Encounter (Signed)
I called the patient. The EEG study was unremarkable. No evidence of seizure-type events. I discussed the results with the patient.

## 2014-11-08 NOTE — Telephone Encounter (Signed)
I called the patient. It does not look like the EEG results are available yet. I promised her I would forward this to Dr. Jannifer Franklin and he would call once he has the results.

## 2014-11-08 NOTE — Procedures (Signed)
    History:  [name] is a 69 year old patient with a history of a sudden onset headache that occurred on 10/06/2014. The patient had a weakness sensation on the left side the head and left arm and leg weakness associated with some alteration in her ability to walk. The patient later developed confusion around 10/13/2014 associated with gastroenteritis. The patient is being evaluated for the confusion.  This is a routine EEG. No skull defects are noted. Medications include albuterol, aspirin, vitamin D, Colace, Lexapro, Nexium, Vivelle-Dot, Allegra, Synthroid, midodrine, Singulair, Zofran, potassium supplementation, and align.   EEG classification: Normal awake  Description of the recording: The background rhythms of this recording consists of a fairly well modulated medium amplitude alpha rhythm of 9 Hz that is reactive to eye opening and closure. As the record progresses, the patient appears to remain in the waking state throughout the recording. Photic stimulation was performed, resulting in a bilateral and symmetric photic driving response. Hyperventilation was not performed. At no time during the recording does there appear to be evidence of spike or spike wave discharges or evidence of focal slowing. EKG monitor shows no evidence of cardiac rhythm abnormalities with a heart rate of 60.  Impression: This is a normal EEG recording in the waking state. No evidence of ictal or interictal discharges are seen.

## 2014-11-10 ENCOUNTER — Encounter: Payer: Self-pay | Admitting: Internal Medicine

## 2014-11-11 DIAGNOSIS — Z853 Personal history of malignant neoplasm of breast: Secondary | ICD-10-CM | POA: Diagnosis not present

## 2014-11-11 DIAGNOSIS — Z1231 Encounter for screening mammogram for malignant neoplasm of breast: Secondary | ICD-10-CM | POA: Diagnosis not present

## 2014-11-11 DIAGNOSIS — J309 Allergic rhinitis, unspecified: Secondary | ICD-10-CM | POA: Diagnosis not present

## 2014-11-22 DIAGNOSIS — J309 Allergic rhinitis, unspecified: Secondary | ICD-10-CM | POA: Diagnosis not present

## 2014-11-29 ENCOUNTER — Encounter: Payer: Self-pay | Admitting: Genetic Counselor

## 2014-12-01 ENCOUNTER — Encounter: Payer: Self-pay | Admitting: Internal Medicine

## 2014-12-01 DIAGNOSIS — J309 Allergic rhinitis, unspecified: Secondary | ICD-10-CM | POA: Diagnosis not present

## 2014-12-07 DIAGNOSIS — H5111 Convergence insufficiency: Secondary | ICD-10-CM | POA: Diagnosis not present

## 2014-12-07 DIAGNOSIS — H25013 Cortical age-related cataract, bilateral: Secondary | ICD-10-CM | POA: Diagnosis not present

## 2014-12-07 DIAGNOSIS — H40023 Open angle with borderline findings, high risk, bilateral: Secondary | ICD-10-CM | POA: Diagnosis not present

## 2014-12-07 DIAGNOSIS — H2513 Age-related nuclear cataract, bilateral: Secondary | ICD-10-CM | POA: Diagnosis not present

## 2014-12-21 DIAGNOSIS — J309 Allergic rhinitis, unspecified: Secondary | ICD-10-CM | POA: Diagnosis not present

## 2014-12-23 DIAGNOSIS — H2513 Age-related nuclear cataract, bilateral: Secondary | ICD-10-CM | POA: Diagnosis not present

## 2014-12-23 DIAGNOSIS — H21301 Idiopathic cysts of iris, ciliary body or anterior chamber, right eye: Secondary | ICD-10-CM | POA: Diagnosis not present

## 2014-12-23 DIAGNOSIS — H5111 Convergence insufficiency: Secondary | ICD-10-CM | POA: Diagnosis not present

## 2014-12-23 DIAGNOSIS — Z9889 Other specified postprocedural states: Secondary | ICD-10-CM | POA: Diagnosis not present

## 2014-12-30 DIAGNOSIS — F329 Major depressive disorder, single episode, unspecified: Secondary | ICD-10-CM | POA: Diagnosis not present

## 2014-12-30 DIAGNOSIS — Z6822 Body mass index (BMI) 22.0-22.9, adult: Secondary | ICD-10-CM | POA: Diagnosis not present

## 2014-12-30 DIAGNOSIS — I480 Paroxysmal atrial fibrillation: Secondary | ICD-10-CM | POA: Diagnosis not present

## 2014-12-30 DIAGNOSIS — Z950 Presence of cardiac pacemaker: Secondary | ICD-10-CM | POA: Diagnosis not present

## 2014-12-30 DIAGNOSIS — G909 Disorder of the autonomic nervous system, unspecified: Secondary | ICD-10-CM | POA: Diagnosis not present

## 2014-12-30 DIAGNOSIS — R5383 Other fatigue: Secondary | ICD-10-CM | POA: Diagnosis not present

## 2014-12-30 DIAGNOSIS — D692 Other nonthrombocytopenic purpura: Secondary | ICD-10-CM | POA: Diagnosis not present

## 2014-12-30 DIAGNOSIS — F909 Attention-deficit hyperactivity disorder, unspecified type: Secondary | ICD-10-CM | POA: Diagnosis not present

## 2014-12-30 DIAGNOSIS — J309 Allergic rhinitis, unspecified: Secondary | ICD-10-CM | POA: Diagnosis not present

## 2014-12-30 DIAGNOSIS — K219 Gastro-esophageal reflux disease without esophagitis: Secondary | ICD-10-CM | POA: Diagnosis not present

## 2014-12-30 DIAGNOSIS — J45909 Unspecified asthma, uncomplicated: Secondary | ICD-10-CM | POA: Diagnosis not present

## 2015-01-10 DIAGNOSIS — J309 Allergic rhinitis, unspecified: Secondary | ICD-10-CM | POA: Diagnosis not present

## 2015-01-19 ENCOUNTER — Other Ambulatory Visit: Payer: Medicare Other

## 2015-01-19 ENCOUNTER — Encounter: Payer: Self-pay | Admitting: Genetic Counselor

## 2015-01-19 ENCOUNTER — Ambulatory Visit (HOSPITAL_BASED_OUTPATIENT_CLINIC_OR_DEPARTMENT_OTHER): Payer: Medicare Other | Admitting: Genetic Counselor

## 2015-01-19 DIAGNOSIS — Z800 Family history of malignant neoplasm of digestive organs: Secondary | ICD-10-CM | POA: Diagnosis not present

## 2015-01-19 DIAGNOSIS — Z8049 Family history of malignant neoplasm of other genital organs: Secondary | ICD-10-CM

## 2015-01-19 DIAGNOSIS — Z853 Personal history of malignant neoplasm of breast: Secondary | ICD-10-CM | POA: Diagnosis present

## 2015-01-19 NOTE — Progress Notes (Signed)
REFERRING PROVIDER: Burnard Bunting, MD [address] [place], [place] [address]   PRIMARY PROVIDER:  Geoffery Lyons, MD  PRIMARY REASON FOR VISIT:  1. History of breast cancer   2. Family history of pancreatic cancer   3. Family history of colon cancer   4. Family history of uterine cancer      HISTORY OF PRESENT ILLNESS:   [name], a 69 y.o. female, was seen for a [facility] cancer genetics consultation at the request of Dr. [name] due to a personal and family history of cancer.  [name] presents to clinic today to discuss the possibility of a hereditary predisposition to cancer, genetic testing, and to further clarify her future cancer risks, as well as potential cancer risks for family members.   In 1985, at the age of 36, [name] was diagnosed with Medullary cancer of the right breast.  She reports that the tumor was ER+. This was treated with surgery.  [name] underwent genetic testing in 2005 based on her personal and family history of cancer.  At that time, her sister had not been diagnosed with pancreatic cancer.  [name] had BRCA1 and BRCA2 sequenced, but no del/dup testing was performed.  Genetic testing was negative.    CANCER HISTORY:   No history exists.     HORMONAL RISK FACTORS:  Menarche was at age 83.  First live birth at age 3.  OCP use for approximately 1 years.  Ovaries intact: no.  Hysterectomy: yes.  Menopausal status: postmenopausal.  HRT use: 10 years. Colonoscopy: yes; normal. Mammogram within the last year: yes. Number of breast biopsies: 1. Up to date with pelvic exams:  yes. Any excessive radiation exposure in the past:  no  Past Medical History  Diagnosis Date  . Diverticulosis   . GERD (gastroesophageal reflux disease)   . Esophageal stricture   . Hemorrhoids   . Mallory - Weiss tear     HEALED   . Interstitial cystitis   . ADHD (attention deficit hyperactivity disorder)   . Breast cancer     right  side  . Blood transfusion   . Thyroid disease   . Allergy     trees/pollen, mold, fungus, dust mites. Takes allergy shots  . Latex allergy, contact dermatitis   . Cataract   . Depression   . Hernia of abdominal wall     spigelian hernia RLQ - SURGERY TO REPAIR  . H/O hiatal hernia   . Neuromuscular disorder     central nervous system neuropathy- seen per Dr Erling Cruz  . Asthma     allergist Dr Olena Heckle- monthly allergy injections  . Complication of anesthesia     reaction to some anesthetics/ 7/12 anesth record on chart- states prefers epidural  . PONV (postoperative nausea and vomiting)     pt needs scop patch  . Recurrent upper respiratory infection (URI) 1/13- to present    bronchitis following surgery- states improved but still with cough. OV with [facility] Dr [name] 09/06/11 on chart  . Symptomatic bradycardia     a. 12/2012 s/p MDT dual chamber PPM, ser # [id number]; b. 12/2012 post-op course complicated by pericardial effusinon req lead revision.  . Pericardial effusion     a. 12/2012 following ppm placement;  b. 01/01/2013 Echo: EF 55-60%, small pericardial effusion w/o RV collapse-->No need for tap/window.  . Chest pain     a. 12/2012 Cath: nl cors, EF 55-65%.  . Autonomic dysfunction     CENTRAL NERVOUS  SYSTEM NEUROPATHY - DX BY DR. Erling Cruz MORE THAN 10 YRS AGO - AND IT IS FELT TO CONTRIBUTE TO THE AUTOMIC  DYSFUNCTION-- PT HAS NUMBNESS LEGS AND FEET AND SOMETIIMES TIPS OF FINGER, SEVERE CONSTIPATION( NO SENSATION TO HAVE BM ), DOUBLE VISION, ORTHOSTATIC HYPOTENSION  . Hypothyroidism   . Bruises easily   . Pacemaker   . Dysrhythmia     HX OF HIGH GRADE HEART BLOCK - REQUIRED PACEMAKER INSERTION  . Shortness of breath     AT TIMES - BUT MUCH IMPROVED AFTER PACEMAKER WAS REPROGRAMED.  . Arthritis     PAIN AND OA LEFT HIP  . CHF (congestive heart failure)   . Gastritis     Past Surgical History  Procedure Laterality Date  . Mastectomy modified radical      right; with immediate  reconstruction  . Laparoscopy  1973  . Abdominal hysterectomy  1974  . Cystoscopy  1975, 2007  . Myringoplasty  1962  . Tympanoplasty  1973    right  . Oophorectomy  1982  . Bladder suspension    . Rectocele repair      with cystocele repair  . Total hip arthroplasty Right   . Ventral hernia repair  05/30/2011    Procedure: HERNIA REPAIR VENTRAL ADULT;  Surgeon: Haywood Lasso, MD;  Location: [facility];  Service: General;  Laterality: Right;  repair right spigelian hernia  . Breast biopsy  2002    NO BLOOD PRESSURES ON RIGHT SIDE/   s/p  axillary node dissection  . Tonsillectomy    . Back surgery      cervical fusion 4-5 with plate  . Cysto with hydrodistension  09/07/2011    Procedure: CYSTOSCOPY/HYDRODISTENSION;  Surgeon: Ailene Rud, MD;  Location: [facility];  Service: Urology;  Laterality: N/A;  INSTILLATION OF MARCAINE/PYRIDIUM INSTILLATION OF MARCAINE/KENALOG  . Pacemaker insertion    . Eye surgery      LASIK EYE SURGERY BILATERAL  . Ulnar nerve transposition Right   . Total hip arthroplasty Left 06/12/2013    Procedure: LEFT TOTAL HIP ARTHROPLASTY ANTERIOR APPROACH;  Surgeon: Mcarthur Rossetti, MD;  Location: [facility];  Service: Orthopedics;  Laterality: Left;  . Permanent pacemaker insertion N/A 12/29/2012    Procedure: PERMANENT PACEMAKER INSERTION;  Surgeon: Deboraha Sprang, MD;  Location: [facility] CATH [facility];  Service: Cardiovascular;  Laterality: N/A;  . Left heart catheterization with coronary angiogram N/A 12/29/2012    Procedure: LEFT HEART CATHETERIZATION WITH CORONARY ANGIOGRAM;  Surgeon: Peter M Martinique, MD;  Location: [facility] CATH LAB;  Service: Cardiovascular;  Laterality: N/A;  . Lead revision N/A 12/30/2012    Procedure: LEAD REVISION;  Surgeon: Evans Lance, MD;  Location: [facility] CATH LAB;  Service: Cardiovascular;  Laterality: N/A;  . Neck surgery      c4-5 ruptured disc    Social History   Social History  . Marital Status: Married    Spouse  Name: N/A  . Number of Children: 2  . Years of Education: 15   Occupational History  . vp [employer]   .     Social History Main Topics  . Smoking status: Never Smoker   . Smokeless tobacco: Never Used  . Alcohol Use: 0.0 oz/week    0 Standard drinks or equivalent per week     Comment: occasional  . Drug Use: No  . Sexual Activity: Not Asked   Other Topics Concern  . None   Social History  Narrative   Patient drinks 1 cup of caffeine daily.   Patient is right handed.     FAMILY HISTORY:  We obtained a detailed, 4-generation family history.  Significant diagnoses are listed below: Family History  Problem Relation Age of Onset  . Heart disease Father   . Diabetes      aunt  . Colon cancer Mother 17  . Depression Mother   . Pancreatic cancer Sister 40  . Diverticulitis Sister   . Uterine cancer Maternal Grandmother   . Heart disease Brother   . Heart disease Paternal Grandmother   . Heart disease Paternal Grandfather   . Throat cancer Maternal Uncle   . Heart disease Paternal Aunt   . Heart disease Paternal Uncle   . Heart disease Brother   . Thrombosis Maternal Aunt   . Cancer Maternal Uncle     NOS   [name] has a son and daughter who are healthy.  She has a full brother and sister and two paternal half brothers.  Her full brother is healthy, her sister died of pancreatic cancer at 28.  Prior to her diagnosis she had a partial colectomy because of colitis and diverticulitis.  Her two half brothers died from heart disease.  Her father died at 62 from heart disease, and her mother died at 53 from colon cancer, dx at 56.  Her mother had a twin sister who lived to 68, two brothers and another sister.  One brother had throat cancer and the other brother had an unknown cancer.  Her maternal grandmother had uterine cancer and died at 46.  Her father had two brothers and two sisters.  One sister had cancer NOS.  There is no other reported cancer  history.  Patient's maternal ancestors are of [place], [place], [place] and [place] descent, and paternal ancestors are of Nordic descent. There is no reported Ashkenazi Jewish ancestry. There is no known consanguinity.  GENETIC COUNSELING ASSESSMENT: [name] is a 69 y.o. female with a personal history of medullary breast cancer and family history of Lynch syndrome cancers which somewhat suggestive of a hereditary cancer syndrome, possibly Lynch syndrome, and predisposition to cancer. We, therefore, discussed and recommended the following at today's visit.   DISCUSSION: We discussed how genetic testing has improved over the last 10 years with del/dup identifying an additional 7-10% of BRCA carriers and additional genes identifying multiple other women with other hereditary causes to their breast cancer. [facility] updated guidelines for following women with additional hereditary genes, and therefore we have recalled women for updated genetic testing.  Based on her previous Medullary breast cancer, we discussed that this condition is a rare ductal carcinoma, sometimes related to BRCA1 mutations.  She had previous negative BRCA1 testing, and most of the cancers in her family do not point to BRCA mutations, however taking another look at this gene will be helpful.    Based on the family history of colon, uterine and pancreatic cancer we discussed Lynch syndrome and PALB2.  Individuals with PALB2 mutations are at increased risk for both early onset breast cancer and pancreatic cancer.  However, Lynch syndrome could also place individuals at increased risk for these cancers, and has been associated with breast cancer as well.  We reviewed the characteristics, features and inheritance patterns of hereditary cancer syndromes. We also discussed genetic testing, including the appropriate family members to test, the process of testing, insurance coverage and turn-around-time for results. We discussed the implications  of a negative,  positive and/or variant of uncertain significant result. We recommended [name] pursue genetic testing for the Comprehensive Cancer gene panel. The Comprehensive Cancer Panel offered by [facility] includes sequencing and/or deletion duplication testing of the following 32 genes: APC, ATM, AXIN2, BARD1, BMPR1A, BRCA1, BRCA2, BRIP1, CDH1, CDK4, CDKN2A, CHEK2, EPCAM, FANCC, MLH1, MSH2, MSH6, MUTYH, NBN, PALB2, PMS2, POLD1, POLE, PTEN, RAD51C, RAD51D, SCG5/GREM1, SMAD4, STK11, TP53, VHL, and XRCC2.     Based on Ms. Coste's personal and family history of cancer, she meets medical criteria for genetic testing. Despite that she meets criteria, she may still have an out of pocket cost. We discussed that if her out of pocket cost for testing is over $100, the laboratory will call and confirm whether she wants to proceed with testing.  If the out of pocket cost of testing is less than $100 she will be billed by the genetic testing laboratory.   PLAN: After considering the risks, benefits, and limitations, [name]  provided informed consent to pursue genetic testing and the blood sample was sent to [facility] for analysis of the Comprehensive Cancer panel. Results should be available within approximately 2-3 weeks' time, at which point they will be disclosed by telephone to [name], as will any additional recommendations warranted by these results. [name] will receive a summary of her genetic counseling visit and a copy of her results once available. This information will also be available in Epic. We encouraged [name] to remain in contact with cancer genetics annually so that we can continuously update the family history and inform her of any changes in cancer genetics and testing that may be of benefit for her family. [name] questions were answered to her satisfaction today. Our contact information was provided should additional questions or concerns  arise.  Lastly, we encouraged [name] to remain in contact with cancer genetics annually so that we can continuously update the family history and inform her of any changes in cancer genetics and testing that may be of benefit for this family.   Ms.  Reynolds questions were answered to her satisfaction today. Our contact information was provided should additional questions or concerns arise. Thank you for the referral and allowing [place] to share in the care of your patient.   [name] P. Florene Glen, [place], [facility] Certified Genetic Counselor [email or web address] phone: [phone]  The patient was seen for a total of 45 minutes in face-to-face genetic counseling.  This patient was discussed with Drs. Magrinat, Lindi Adie and/or Burr Medico who agrees with the above.    _______________________________________________________________________ For Office Staff:  Number of people involved in session: 1 Was an Intern/ student involved with case: no

## 2015-01-20 ENCOUNTER — Encounter: Payer: Self-pay | Admitting: Neurology

## 2015-01-20 ENCOUNTER — Ambulatory Visit (INDEPENDENT_AMBULATORY_CARE_PROVIDER_SITE_OTHER): Payer: Medicare Other | Admitting: Neurology

## 2015-01-20 VITALS — BP 152/85 | HR 60 | Ht 67.0 in | Wt 146.5 lb

## 2015-01-20 DIAGNOSIS — G43909 Migraine, unspecified, not intractable, without status migrainosus: Secondary | ICD-10-CM

## 2015-01-20 DIAGNOSIS — R269 Unspecified abnormalities of gait and mobility: Secondary | ICD-10-CM

## 2015-01-20 DIAGNOSIS — G43109 Migraine with aura, not intractable, without status migrainosus: Secondary | ICD-10-CM

## 2015-01-20 DIAGNOSIS — I639 Cerebral infarction, unspecified: Secondary | ICD-10-CM | POA: Diagnosis not present

## 2015-01-20 NOTE — Progress Notes (Signed)
Reason for visit: Headache, altered mental status  [name] is an 69 y.o. female  History of present illness:  [name] is a 69 year old right-handed white female with a history of underlying anxiety and depression. The patient was seen previously for left sided motor and sensory symptoms that occurred after a sharp headache in the left occipital area that began after a sneeze. The patient underwent a full stroke workup that was unremarkable. The patient was felt to have nonorganic features to her clinical examination, with ongoing left-sided sensory complaints. MRI of the brain that was done did not show evidence of a stroke event. She had EEG evaluation that was normal. She returns to the office indicating that she has not had any further events of confusion. She has had improvement in the left-sided sensory complaints. She is being evaluated for a retinal issue through [facility]. The headaches have been occasional, but are not significant. The patient indicates that she has a history of loss of oxygen to the brain. She has dizziness with standing up, she has ongoing gait instability, no falls. She has a history of restless leg syndrome. She returns for an evaluation.  Past Medical History  Diagnosis Date  . Diverticulosis   . GERD (gastroesophageal reflux disease)   . Esophageal stricture   . Hemorrhoids   . Mallory - Weiss tear     HEALED   . Interstitial cystitis   . ADHD (attention deficit hyperactivity disorder)   . Breast cancer     right side  . Blood transfusion   . Thyroid disease   . Allergy     trees/pollen, mold, fungus, dust mites. Takes allergy shots  . Latex allergy, contact dermatitis   . Cataract   . Depression   . Hernia of abdominal wall     spigelian hernia RLQ - SURGERY TO REPAIR  . H/O hiatal hernia   . Neuromuscular disorder     central nervous system neuropathy- seen per Dr Erling Cruz  . Asthma     allergist Dr Olena Heckle- monthly allergy  injections  . Complication of anesthesia     reaction to some anesthetics/ 7/12 anesth record on chart- states prefers epidural  . PONV (postoperative nausea and vomiting)     pt needs scop patch  . Recurrent upper respiratory infection (URI) 1/13- to present    bronchitis following surgery- states improved but still with cough. OV with [facility] Dr [name] 09/06/11 on chart  . Symptomatic bradycardia     a. 12/2012 s/p MDT dual chamber PPM, ser # [id number]; b. 12/2012 post-op course complicated by pericardial effusinon req lead revision.  . Pericardial effusion     a. 12/2012 following ppm placement;  b. 01/01/2013 Echo: EF 55-60%, small pericardial effusion w/o RV collapse-->No need for tap/window.  . Chest pain     a. 12/2012 Cath: nl cors, EF 55-65%.  . Autonomic dysfunction     CENTRAL NERVOUS SYSTEM NEUROPATHY - DX BY DR. Erling Cruz MORE THAN 10 YRS AGO - AND IT IS FELT TO CONTRIBUTE TO THE AUTOMIC  DYSFUNCTION-- PT HAS NUMBNESS LEGS AND FEET AND SOMETIIMES TIPS OF FINGER, SEVERE CONSTIPATION( NO SENSATION TO HAVE BM ), DOUBLE VISION, ORTHOSTATIC HYPOTENSION  . Hypothyroidism   . Bruises easily   . Pacemaker   . Dysrhythmia     HX OF HIGH GRADE HEART BLOCK - REQUIRED PACEMAKER INSERTION  . Shortness of breath     AT TIMES - BUT MUCH IMPROVED AFTER PACEMAKER  WAS REPROGRAMED.  . Arthritis     PAIN AND OA LEFT HIP  . CHF (congestive heart failure)   . Gastritis     Past Surgical History  Procedure Laterality Date  . Mastectomy modified radical      right; with immediate reconstruction  . Laparoscopy  1973  . Abdominal hysterectomy  1974  . Cystoscopy  1975, 2007  . Myringoplasty  1962  . Tympanoplasty  1973    right  . Oophorectomy  1982  . Bladder suspension    . Rectocele repair      with cystocele repair  . Total hip arthroplasty Right   . Ventral hernia repair  05/30/2011    Procedure: HERNIA REPAIR VENTRAL ADULT;  Surgeon: Haywood Lasso, MD;  Location: [place];  Service: General;  Laterality: Right;  repair right spigelian hernia  . Breast biopsy  2002    NO BLOOD PRESSURES ON RIGHT SIDE/   s/p  axillary node dissection  . Tonsillectomy    . Back surgery      cervical fusion 4-5 with plate  . Cysto with hydrodistension  09/07/2011    Procedure: CYSTOSCOPY/HYDRODISTENSION;  Surgeon: Ailene Rud, MD;  Location: [facility];  Service: Urology;  Laterality: N/A;  INSTILLATION OF MARCAINE/PYRIDIUM INSTILLATION OF MARCAINE/KENALOG  . Pacemaker insertion    . Eye surgery      LASIK EYE SURGERY BILATERAL  . Ulnar nerve transposition Right   . Total hip arthroplasty Left 06/12/2013    Procedure: LEFT TOTAL HIP ARTHROPLASTY ANTERIOR APPROACH;  Surgeon: Mcarthur Rossetti, MD;  Location: [facility];  Service: Orthopedics;  Laterality: Left;  . Permanent pacemaker insertion N/A 12/29/2012    Procedure: PERMANENT PACEMAKER INSERTION;  Surgeon: Deboraha Sprang, MD;  Location: [facility] C[facility];  Service: Cardiovascular;  Laterality: N/A;  . Left heart catheterization with coronary angiogram N/A 12/29/2012    Procedure: LEFT HEART CATHETERIZATION WITH CORONARY ANGIOGRAM;  Surgeon: Peter M Martinique, MD;  Location: [facility] CATH LAB;  Service: Cardiovascular;  Laterality: N/A;  . Lead revision N/A 12/30/2012    Procedure: LEAD REVISION;  Surgeon: Evans Lance, MD;  Location: [facility] CATH LAB;  Service: Cardiovascular;  Laterality: N/A;  . Neck surgery      c4-5 ruptured disc    Family History  Problem Relation Age of Onset  . Heart disease Father   . Diabetes      aunt  . Colon cancer Mother 39  . Depression Mother   . Pancreatic cancer Sister 31  . Diverticulitis Sister   . Uterine cancer Maternal Grandmother   . Heart disease Brother   . Heart disease Paternal Grandmother   . Heart disease Paternal Grandfather   . Throat cancer Maternal Uncle   . Heart disease Paternal Aunt   . Heart disease Paternal Uncle   . Heart disease Brother   . Thrombosis Maternal  Aunt   . Cancer Maternal Uncle     NOS    Social history:  reports that she has never smoked. She has never used smokeless tobacco. She reports that she does not drink alcohol or use illicit drugs.    Allergies  Allergen Reactions  . Anesthetics, Amide Other (See Comments)    Patient unsure of names, however multiples cause swelling of airway & nausea  . Clindamycin Swelling  . Neomycin Swelling  . Shellfish Allergy Other (See Comments)    Neurotoxic reaction  . Sulfonamide Derivatives Anaphylaxis and Swelling  .  Zolpidem Tartrate Other (See Comments)    Jerking motions   . Azithromycin Other (See Comments)    Severe gastritis   . Bactroban Other (See Comments)    Causes sores in nose  . Ciprofloxacin     REACTION: joint swelling  . Codeine Swelling  . Meperidine Hcl Nausea Only    Hallucinations   . Morphine Nausea Only    Hallucinations   . Neosporin [Neomycin-Polymyxin-Gramicidin] Hives and Dermatitis    All topical "orin's ointment"  . Nitrofurantoin     REACTION: neuropathy in legs  . Other     ALLERGIC TO WARM WATER SHELLFISH  PT STATES PLEASE NOTE SHE CAN TAKE OXYCODONE AND TYLENOL FOR PAIN -NOT ALLERGIC  . Penicillins Other (See Comments)    Swelling in joints  . Tramadol     Makes jerk  . Latex Rash    Medications:  Prior to Admission medications   Medication Sig Start Date End Date Taking? Authorizing Provider  acetaminophen (TYLENOL) 325 MG tablet Take 2 tablets (650 mg total) by mouth every 6 (six) hours as needed for mild pain. 10/08/14  Yes Belkys A Regalado, MD  albuterol (PROVENTIL HFA;VENTOLIN HFA) 108 (90 BASE) MCG/ACT inhaler Inhale 2 puffs into the lungs every 6 (six) hours as needed. For shortness of breath   Yes Historical Provider, MD  aspirin EC 81 MG tablet Take 1 tablet (81 mg total) by mouth daily. 10/08/14  Yes Belkys A Regalado, MD  b complex vitamins capsule Take 1 capsule by mouth daily.    Yes Historical Provider, MD  cholecalciferol  (VITAMIN D) 1000 UNITS tablet Take 1,000 Units by mouth daily.   Yes Historical Provider, MD  ciclesonide (OMNARIS) 50 MCG/ACT nasal spray Place 2 sprays into both nostrils as needed for allergies.    Yes Historical Provider, MD  cyanocobalamin 2000 MCG tablet Take 2,000 mcg by mouth daily.   Yes Historical Provider, MD  docusate sodium (COLACE) 100 MG capsule Take 200 mg by mouth every evening.    Yes Historical Provider, MD  escitalopram (LEXAPRO) 10 MG tablet Take 10 mg by mouth daily.   Yes Historical Provider, MD  esomeprazole (NEXIUM) 40 MG capsule Take 20 mg by mouth daily.    Yes Historical Provider, MD  estradiol (ESTRING) 2 MG vaginal ring Place 2 mg vaginally every 3 (three) months. follow package directions   Yes Historical Provider, MD  estradiol (VIVELLE-DOT) 0.025 MG/24HR Place 1 patch onto the skin 2 (two) times a week. Applies new patch on Sunday & Thursday   Yes Historical Provider, MD  fexofenadine (ALLEGRA) 180 MG tablet Take 180 mg by mouth daily.    Yes Historical Provider, MD  levothyroxine (SYNTHROID, LEVOTHROID) 100 MCG tablet Take 100 mcg by mouth daily before breakfast.   Yes Historical Provider, MD  magnesium oxide (MAG-OX) 400 MG tablet Take 400 mg by mouth daily.   Yes Historical Provider, MD  midodrine (PROAMATINE) 10 MG tablet Take 1 tablet (10 mg total) by mouth 3 (three) times daily with meals. 10/08/14  Yes Belkys A Regalado, MD  midodrine (PROAMATINE) 5 MG tablet Take 5 mg by mouth daily.   Yes Historical Provider, MD  montelukast (SINGULAIR) 10 MG tablet Take 10 mg by mouth as needed (allergies).    Yes Historical Provider, MD  ondansetron (ZOFRAN ODT) 4 MG disintegrating tablet Take 1 tablet (4 mg total) by mouth every 8 (eight) hours as needed for nausea or vomiting. 10/13/14  Yes Fredia Sorrow, MD  potassium chloride (KLOR-CON M10) 10 MEQ tablet Take 1 tablet (10 mEq total) by mouth daily. 08/11/14  Yes Deboraha Sprang, MD  PRESCRIPTION MEDICATION ALLERGY SHOT  EVERY 2 WEEKS BY DR. Neldon Mc   Yes Historical Provider, MD  Probiotic Product (ALIGN) 4 MG CAPS Take 4 mg by mouth at bedtime.    Yes Historical Provider, MD  pyridostigmine (MESTINON) 60 MG tablet Take 30 mg by mouth 3 (three) times daily.   Yes Historical Provider, MD    ROS:  Out of a complete 14 system review of symptoms, the patient complains only of the following symptoms, and all other reviewed systems are negative.  Chills Double vision Cold intolerance Restless legs Memory loss, dizziness, speech difficulty  Blood pressure 152/85, pulse 60, height 5\' 7"  (1.702 m), weight 146 lb 8 oz (66.452 kg).  Physical Exam  General: The patient is alert and cooperative at the time of the examination.  Skin: No significant peripheral edema is noted.   Neurologic Exam  Mental status: The patient is alert and oriented x 3 at the time of the examination. The patient has apparent normal recent and remote memory, with an apparently normal attention span and concentration ability.   Cranial nerves: Facial symmetry is present. Speech is normal, no aphasia or dysarthria is noted. Extraocular movements are full. Visual fields are full.  Motor: The patient has good strength in all 4 extremities.  Sensory examination: Soft touch sensation is symmetric on the face, arms, and legs.  Coordination: The patient has good finger-nose-finger and heel-to-shin bilaterally.  Gait and station: The patient has a slightly unsteady gait. Tandem gait is slightly unsteady. Romberg is negative. No drift is seen.  Reflexes: Deep tendon reflexes are symmetric.   Assessment/Plan:  1. History of headache  2. Mild gait instability  3. History of nonorganic neurologic examination  The patient is having some ongoing complaints of imbalance. I will set her up for physical therapy for gait training. I am not clear that this patient has any primary neurologic issue, I will see her back on an as-needed  basis.  [name] Alexanders MD 01/20/2015 6:43 PM  [facility] [address] [place], [place] [address]  Phone [phone] Fax [phone]

## 2015-01-20 NOTE — Patient Instructions (Signed)

## 2015-01-25 DIAGNOSIS — J309 Allergic rhinitis, unspecified: Secondary | ICD-10-CM | POA: Diagnosis not present

## 2015-01-27 ENCOUNTER — Ambulatory Visit: Payer: TRICARE For Life (TFL) | Admitting: Neurology

## 2015-01-31 ENCOUNTER — Telehealth: Payer: Self-pay | Admitting: Genetic Counselor

## 2015-01-31 ENCOUNTER — Encounter: Payer: Self-pay | Admitting: Genetic Counselor

## 2015-01-31 DIAGNOSIS — Z1379 Encounter for other screening for genetic and chromosomal anomalies: Secondary | ICD-10-CM | POA: Insufficient documentation

## 2015-01-31 NOTE — Telephone Encounter (Signed)
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  Briefly discussed CHEK2 mutations and offered to see her and her children for genetic counseling.  She will check with her son, who lives in town, and call back for an appointment.

## 2015-02-02 ENCOUNTER — Ambulatory Visit (HOSPITAL_BASED_OUTPATIENT_CLINIC_OR_DEPARTMENT_OTHER): Payer: Medicare Other | Admitting: Genetic Counselor

## 2015-02-02 ENCOUNTER — Telehealth: Payer: Self-pay | Admitting: Neurology

## 2015-02-02 ENCOUNTER — Encounter: Payer: Self-pay | Admitting: Genetic Counselor

## 2015-02-02 DIAGNOSIS — IMO0002 Reserved for concepts with insufficient information to code with codable children: Secondary | ICD-10-CM

## 2015-02-02 DIAGNOSIS — Z1509 Genetic susceptibility to other malignant neoplasm: Secondary | ICD-10-CM | POA: Insufficient documentation

## 2015-02-02 DIAGNOSIS — Z1379 Encounter for other screening for genetic and chromosomal anomalies: Secondary | ICD-10-CM

## 2015-02-02 DIAGNOSIS — Z808 Family history of malignant neoplasm of other organs or systems: Secondary | ICD-10-CM | POA: Diagnosis not present

## 2015-02-02 DIAGNOSIS — Z853 Personal history of malignant neoplasm of breast: Secondary | ICD-10-CM | POA: Diagnosis present

## 2015-02-02 DIAGNOSIS — Z800 Family history of malignant neoplasm of digestive organs: Secondary | ICD-10-CM

## 2015-02-02 DIAGNOSIS — Z809 Family history of malignant neoplasm, unspecified: Secondary | ICD-10-CM

## 2015-02-02 DIAGNOSIS — Z8049 Family history of malignant neoplasm of other genital organs: Secondary | ICD-10-CM | POA: Diagnosis not present

## 2015-02-02 DIAGNOSIS — Z315 Encounter for genetic counseling: Secondary | ICD-10-CM | POA: Diagnosis not present

## 2015-02-02 NOTE — Telephone Encounter (Signed)
Spouse called to check status of Physical Therapy that Dr. Jannifer Franklin was going to set up for wife. Spouse states they have yet to hear from anyone.

## 2015-02-02 NOTE — Telephone Encounter (Signed)
I called [facility] to verify they have the referral. I spoke to Angie and she does have the referral. She stated they were working on their workque today and should be able to call the patient. I called the patient's husband to let him know this. I also gave him the number to [facility] and advised he could call if they did not hear from them today.

## 2015-02-02 NOTE — Progress Notes (Signed)
REFERRING PROVIDER: Burnard Bunting, MD [place], [place] [address]   Lurline Del, MD  PRIMARY PROVIDER:  Geoffery Lyons, MD  PRIMARY REASON FOR VISIT:  1. History of breast cancer   2. Family history of pancreatic cancer   3. Family history of colon cancer   4. Family history of uterine cancer   5. Monoallelic mutation of CHEK2 gene   6. Genetic testing      HISTORY OF PRESENT ILLNESS:   Ms. Lagasse, a 69 y.o. female, was seen for a [place] cancer genetics consultation to discuss her genetic test result finding of a CHEK2 pathogenic mutation.  Ms. Christiansen presents to clinic today to discuss the possibility of a hereditary predisposition to cancer, genetic testing, and to further clarify her future cancer risks, as well as potential cancer risks for family members. Ms. Ormand had genetic testing performed.  Genetic testing was performed through [facility] which found a pathogenic mutation in CHEK2 called c.1486C>T and a variant of uncertain significance called AXIN2 c.2272G>A.  The variant of uncertain significance is not a clinically actionable finding.  CANCER HISTORY:   No history exists.      Past Medical History  Diagnosis Date  . Diverticulosis   . GERD (gastroesophageal reflux disease)   . Esophageal stricture   . Hemorrhoids   . Mallory - Weiss tear     HEALED   . Interstitial cystitis   . ADHD (attention deficit hyperactivity disorder)   . Breast cancer     right side  . Blood transfusion   . Thyroid disease   . Allergy     trees/pollen, mold, fungus, dust mites. Takes allergy shots  . Latex allergy, contact dermatitis   . Cataract   . Depression   . Hernia of abdominal wall     spigelian hernia RLQ - SURGERY TO REPAIR  . H/O hiatal hernia   . Neuromuscular disorder     central nervous system neuropathy- seen per Dr Erling Cruz  . Asthma     allergist Dr Olena Heckle- monthly allergy injections  . Complication of anesthesia     reaction to  some anesthetics/ 7/12 anesth record on chart- states prefers epidural  . PONV (postoperative nausea and vomiting)     pt needs scop patch  . Recurrent upper respiratory infection (URI) 1/13- to present    bronchitis following surgery- states improved but still with cough. OV with [facility] Dr [name] 09/06/11 on chart  . Symptomatic bradycardia     a. 12/2012 s/p MDT dual chamber PPM, ser # [id number]; b. 12/2012 post-op course complicated by pericardial effusinon req lead revision.  . Pericardial effusion     a. 12/2012 following ppm placement;  b. 01/01/2013 Echo: EF 55-60%, small pericardial effusion w/o RV collapse-->No need for tap/window.  . Chest pain     a. 12/2012 Cath: nl cors, EF 55-65%.  . Autonomic dysfunction     CENTRAL NERVOUS SYSTEM NEUROPATHY - DX BY DR. Erling Cruz MORE THAN 10 YRS AGO - AND IT IS FELT TO CONTRIBUTE TO THE AUTOMIC  DYSFUNCTION-- PT HAS NUMBNESS LEGS AND FEET AND SOMETIIMES TIPS OF FINGER, SEVERE CONSTIPATION( NO SENSATION TO HAVE BM ), DOUBLE VISION, ORTHOSTATIC HYPOTENSION  . Hypothyroidism   . Bruises easily   . Pacemaker   . Dysrhythmia     HX OF HIGH GRADE HEART BLOCK - REQUIRED PACEMAKER INSERTION  . Shortness of breath     AT TIMES - BUT MUCH IMPROVED AFTER PACEMAKER WAS REPROGRAMED.  Marland Kitchen  Arthritis     PAIN AND OA LEFT HIP  . CHF (congestive heart failure)   . Gastritis     Past Surgical History  Procedure Laterality Date  . Mastectomy modified radical      right; with immediate reconstruction  . Laparoscopy  1973  . Abdominal hysterectomy  1974  . Cystoscopy  1975, 2007  . Myringoplasty  1962  . Tympanoplasty  1973    right  . Oophorectomy  1982  . Bladder suspension    . Rectocele repair      with cystocele repair  . Total hip arthroplasty Right   . Ventral hernia repair  05/30/2011    Procedure: HERNIA REPAIR VENTRAL ADULT;  Surgeon: Haywood Lasso, MD;  Location: [facility];  Service: General;  Laterality: Right;  repair  right spigelian hernia  . Breast biopsy  2002    NO BLOOD PRESSURES ON RIGHT SIDE/   s/p  axillary node dissection  . Tonsillectomy    . Back surgery      cervical fusion 4-5 with plate  . Cysto with hydrodistension  09/07/2011    Procedure: CYSTOSCOPY/HYDRODISTENSION;  Surgeon: Ailene Rud, MD;  Location: [facility];  Service: Urology;  Laterality: N/A;  INSTILLATION OF MARCAINE/PYRIDIUM INSTILLATION OF MARCAINE/KENALOG  . Pacemaker insertion    . Eye surgery      LASIK EYE SURGERY BILATERAL  . Ulnar nerve transposition Right   . Total hip arthroplasty Left 06/12/2013    Procedure: LEFT TOTAL HIP ARTHROPLASTY ANTERIOR APPROACH;  Surgeon: Mcarthur Rossetti, MD;  Location: [facility];  Service: Orthopedics;  Laterality: Left;  . Permanent pacemaker insertion N/A 12/29/2012    Procedure: PERMANENT PACEMAKER INSERTION;  Surgeon: Deboraha Sprang, MD;  Location[facility] CATH LAB;  Service: Cardiovascular;  Laterality: N/A;  . Left heart catheterization with coronary angiogram N/A 12/29/2012    Procedure: LEFT HEART CATHETERIZATION WITH CORONARY ANGIOGRAM;  Surgeon: Peter M Martinique, MD;  Location: [facility] CATH LAB;  Service: Cardiovascular;  Laterality: N/A;  . Lead revision N/A 12/30/2012    Procedure: LEAD REVISION;  Surgeon: Evans Lance, MD;  Location: [facility] CATH LAB;  Service: Cardiovascular;  Laterality: N/A;  . Neck surgery      c4-5 ruptured disc    Social History   Social History  . Marital Status: Married    Spouse Name: N/A  . Number of Children: 2  . Years of Education: 15   Occupational History  . vp [employer]   .     Social History Main Topics  . Smoking status: Never Smoker   . Smokeless tobacco: Never Used  . Alcohol Use: No     Comment: occasional  . Drug Use: No  . Sexual Activity: Not Asked   Other Topics Concern  . None   Social History Narrative   Patient drinks 1-2 cup of caffeine daily.   Patient is right handed.     FAMILY HISTORY:   We obtained a detailed, 4-generation family history.  Significant diagnoses are listed below: Family History  Problem Relation Age of Onset  . Heart disease Father   . Diabetes      aunt  . Colon cancer Mother 31  . Depression Mother   . Pancreatic cancer Sister 68  . Diverticulitis Sister   . Uterine cancer Maternal Grandmother   . Heart disease Brother   . Heart disease Paternal Grandmother   . Heart disease Paternal Grandfather   .  Throat cancer Maternal Uncle   . Heart disease Paternal Aunt   . Heart disease Paternal Uncle   . Heart disease Brother   . Thrombosis Maternal Aunt   . Cancer Maternal Uncle     NOS    Ms. Grulke has a son and daughter who are healthy. She has a full brother and sister and two paternal half brothers. Her full brother is healthy, her sister died of pancreatic cancer at 53. Prior to her diagnosis she had a partial colectomy because of colitis and diverticulitis. Her two half brothers died from heart disease. Her father died at 92 from heart disease, and her mother died at 54 from colon cancer, dx at 63. Her mother had a twin sister who lived to 71, two brothers and another sister. One brother had throat cancer and the other brother had an unknown cancer. Her maternal grandmother had uterine cancer and died at 6. Her father had two brothers and two sisters. One sister had cancer NOS. There is no other reported cancer history. Patient's maternal ancestors are of [place], [place], [place] and [place] descent, and paternal ancestors are of Nordic descent. There is no reported Ashkenazi Jewish ancestry. There is no known consanguinity.  GENETIC COUNSELING ASSESSMENT: CHEK2 mutations have been found to be associated with an increased risk of breast and other cancers. The estimated cancer risks vary widely and may be influenced by family history. Most risk estimates are based on the common founder mutation c.1100delC.  Women with a CHEK2 deleterious  mutation have approximately a 24% (no family history of breast cancer) to 48% (strong family history of breast cancer) lifetime risk of breast cancer and up to a 25% risk of a second breast cancer. Men may have an increased risk for female breast cancer of about 1%. Men and women may have an increased risk of colon cancer (~10% lifetime risk). According to the NCCN guidelines, individuals with CHEK2 mutations should consider breast MRI's as a part of regular breast cancer screening.    CANCER SCREENING: We recommend that Ms. Ketcher be seen in our high risk breast clinic, and a referral can be made by her PCP to the [place].  Below are the NCCN Practice guidelines for women and men.  However, because the breast cancer risks for women and prostate cancer risks for men may be similar, it is appropriate to consider these high risk management recommendations.  Breast Management Options We reviewed the NCCN practice guidelines (v.1.2017) for breast management for women at an increased risk of breast cancer because of BRCA1 or BRCA2 mutations:   1. Breast awareness (which may include periodic, consistent breast self exam) starting at age 76.  2. Clinical breast exam, every 6-12 months, starting at age 2.  79. Breast screening starting at age 52 or 55 years younger than the earliest age of onset:  . Annual breast MRI screening (preferred), or individualized based on earliest age of breast cancer onset in family.  . Annual mammogram and breast MRI screening.  . Age >75, management should be considered on an individual basis.  Female Breast and Prostate Management Options We reviewed the NCCN practice guidelines (v.1.2017) for female breast and prostate management for men at high risk of female breast and prostate cancer because of BRCA1 or BRCA2 mutations:  1. Breast self-exam training and education starting at age 74.  2. Clinical breast exam, every 6-12 months, starting at age 56 years  28. Consider  prostate cancer screening starting at age  40 years.   Colon Cancer Management:  Men and women with a deleterious CHEK2 mutation may have up to a 10% lifetime risk for colon cancer. The following is recommended for individuals with a CHEK2 mutation:  Personal history of colon cancer  Follow instructions provided by your physician based on your personal history.  Do not have a personal history of colon cancer but have a parent/sibling/child with colon cancer: Colonoscopy every 5 years starting at age 70 or 43 years younger than the earliest age of onset, whichever is younger.  Do not have a personal history of colon cancer but do not have a parent/sibling/child with colon cancer: Colonoscopy every 5 years starting at age 65.   FAMILY MEMBERS: It is important that all of Ms. Sowder's relatives (both men and women) know of the presence of this gene mutation.  Women need to know that they may be at increased risk for breast and colon cancers.  Men are at slightly increased risk for breast, prostate and colon cancers.  Genetic testing can sort out who in your family is at risk and who is not.  We would be happy to help meet with and coordinate genetic testing for any relative that is interested.  Ms. Staffa children and siblings are at 50% risk to have inherited the mutation found in her. We recommend they have genetic testing for this same mutation, as identifying the presence of this mutation would allow them to also take advantage of risk-reducing measures.   Our knowledge of cancer risks related to CHEK2 mutations will continue to evolve. We recommended that Ms. Johnsie Cancel follow up with the genetics clinic annually so we can provide her with the most current information about CHEK2 and cancer risk, as well as with any changes to her family history (new cancer diagnoses, genetic test results).  SUPPORT AND RESOURCES:  We provided information about two support groups for hereditary cancer  syndrome information and support, Facing Our Risk (www.facingourrisk.com) and Bright Pink (www.brightpink.org) which some people have found useful.  They provide opportunities to speak with other individuals from high-risk families.     Ms.  Shorb questions were answered to her satisfaction today. Our contact information was provided should additional questions or concerns arise. Thank you for the referral and allowing [place] to share in the care of your patient.   Karen P. Florene Glen, [place], [facility] Certified Genetic Counselor [email or web address] phone: [phone]  The patient was seen for a total of 30 minutes in face-to-face genetic counseling.  This patient was discussed with Drs. Magrinat, Lindi Adie and/or Burr Medico who agrees with the above.    _______________________________________________________________________ For Office Staff:  Number of people involved in session: 3 Was an Intern/ student involved with case: no

## 2015-02-08 DIAGNOSIS — H21301 Idiopathic cysts of iris, ciliary body or anterior chamber, right eye: Secondary | ICD-10-CM | POA: Diagnosis not present

## 2015-02-10 ENCOUNTER — Ambulatory Visit (INDEPENDENT_AMBULATORY_CARE_PROVIDER_SITE_OTHER): Payer: Medicare Other

## 2015-02-10 DIAGNOSIS — J309 Allergic rhinitis, unspecified: Secondary | ICD-10-CM

## 2015-02-14 ENCOUNTER — Ambulatory Visit: Payer: Medicare Other | Attending: Neurology | Admitting: Rehabilitative and Restorative Service Providers"

## 2015-02-14 DIAGNOSIS — R269 Unspecified abnormalities of gait and mobility: Secondary | ICD-10-CM

## 2015-02-14 NOTE — Therapy (Signed)
[facility] [address] [place], [place], [address] Phone: [phone]   Fax:  [phone]  Physical Therapy Evaluation  Patient Details  Name: [name] MRN: [id number] Date of Birth: 1945/08/10 Referring Provider:  Kathrynn Ducking, MD  Encounter Date: 02/14/2015      PT End of Session - 02/14/15 1424    Visit Number 1   Number of Visits 16   Date for PT Re-Evaluation 04/16/15   Authorization Type G code every 10th visit   PT Start Time 1022   PT Stop Time 1102   PT Time Calculation (min) 40 min   Activity Tolerance Patient tolerated treatment well   Behavior During Therapy Anxious      Past Medical History  Diagnosis Date  . Diverticulosis   . GERD (gastroesophageal reflux disease)   . Esophageal stricture   . Hemorrhoids   . Mallory - Weiss tear     HEALED   . Interstitial cystitis   . ADHD (attention deficit hyperactivity disorder)   . Breast cancer     right side  . Blood transfusion   . Thyroid disease   . Allergy     trees/pollen, mold, fungus, dust mites. Takes allergy shots  . Latex allergy, contact dermatitis   . Cataract   . Depression   . Hernia of abdominal wall     spigelian hernia RLQ - SURGERY TO REPAIR  . H/O hiatal hernia   . Neuromuscular disorder     central nervous system neuropathy- seen per Dr Erling Cruz  . Asthma     allergist Dr Olena Heckle- monthly allergy injections  . Complication of anesthesia     reaction to some anesthetics/ 7/12 anesth record on chart- states prefers epidural  . PONV (postoperative nausea and vomiting)     pt needs scop patch  . Recurrent upper respiratory infection (URI) 1/13- to present    bronchitis following surgery- states improved but still with cough. OV with [facility] Dr [name] 09/06/11 on chart  . Symptomatic bradycardia     a. 12/2012 s/p MDT dual chamber PPM, ser # [id number]; b. 12/2012 post-op course complicated by pericardial  effusinon req lead revision.  . Pericardial effusion     a. 12/2012 following ppm placement;  b. 01/01/2013 Echo: EF 55-60%, small pericardial effusion w/o RV collapse-->No need for tap/window.  . Chest pain     a. 12/2012 Cath: nl cors, EF 55-65%.  . Autonomic dysfunction     CENTRAL NERVOUS SYSTEM NEUROPATHY - DX BY DR. Erling Cruz MORE THAN 10 YRS AGO - AND IT IS FELT TO CONTRIBUTE TO THE AUTOMIC  DYSFUNCTION-- PT HAS NUMBNESS LEGS AND FEET AND SOMETIIMES TIPS OF FINGER, SEVERE CONSTIPATION( NO SENSATION TO HAVE BM ), DOUBLE VISION, ORTHOSTATIC HYPOTENSION  . Hypothyroidism   . Bruises easily   . Pacemaker   . Dysrhythmia     HX OF HIGH GRADE HEART BLOCK - REQUIRED PACEMAKER INSERTION  . Shortness of breath     AT TIMES - BUT MUCH IMPROVED AFTER PACEMAKER WAS REPROGRAMED.  . Arthritis     PAIN AND OA LEFT HIP  . CHF (congestive heart failure)   . Gastritis     Past Surgical History  Procedure Laterality Date  . Mastectomy modified radical      right; with immediate reconstruction  . Laparoscopy  1973  . Abdominal hysterectomy  1974  . Cystoscopy  1975, 2007  . Myringoplasty  1962  . Tympanoplasty  1973    right  . Oophorectomy  1982  . Bladder suspension    . Rectocele repair      with cystocele repair  . Total hip arthroplasty Right   . Ventral hernia repair  05/30/2011    Procedure: HERNIA REPAIR VENTRAL ADULT;  Surgeon: Haywood Lasso, MD;  Location: [facility];  Service: General;  Laterality: Right;  repair right spigelian hernia  . Breast biopsy  2002    NO BLOOD PRESSURES ON RIGHT SIDE/   s/p  axillary node dissection  . Tonsillectomy    . Back surgery      cervical fusion 4-5 with plate  . Cysto with hydrodistension  09/07/2011    Procedure: CYSTOSCOPY/HYDRODISTENSION;  Surgeon: Ailene Rud, MD;  Location: [facility];  Service: Urology;  Laterality: N/A;  INSTILLATION OF MARCAINE/PYRIDIUM INSTILLATION OF MARCAINE/KENALOG  . Pacemaker insertion    .  Eye surgery      LASIK EYE SURGERY BILATERAL  . Ulnar nerve transposition Right   . Total hip arthroplasty Left 06/12/2013    Procedure: LEFT TOTAL HIP ARTHROPLASTY ANTERIOR APPROACH;  Surgeon: Mcarthur Rossetti, MD;  Location: [facility];  Service: Orthopedics;  Laterality: Left;  . Permanent pacemaker insertion N/A 12/29/2012    Procedure: PERMANENT PACEMAKER INSERTION;  Surgeon: Deboraha Sprang, MD;  Location: [facility] CATH LAB;  Service: Cardiovascular;  Laterality: N/A;  . Left heart catheterization with coronary angiogram N/A 12/29/2012    Procedure: LEFT HEART CATHETERIZATION WITH CORONARY ANGIOGRAM;  Surgeon: Peter M Martinique, MD;  Location: [facility] CATH LAB;  Service: Cardiovascular;  Laterality: N/A;  . Lead revision N/A 12/30/2012    Procedure: LEAD REVISION;  Surgeon: Evans Lance, MD;  Location: [facility] CATH LAB;  Service: Cardiovascular;  Laterality: N/A;  . Neck surgery      c4-5 ruptured disc    There were no vitals filed for this visit.  Visit Diagnosis:  Abnormality of gait      Subjective Assessment - 02/14/15 1030    Subjective The patient reports declining mobility since undergoing hospitalization for bradycardia.  She feels that her functional status has significantly declined since 09/2014.  Her cc: double vision to 6 feet, lightheaded when rising (takes medication for), difficulty walking/balance.  The patient reports she doesn't wish to use an assistive device.      Pertinent History h/o breast cancer, both THR, complicated migraine, h/o multiple cardiac procedures with pacemaker placed, h/o vertigo.   Patient Stated Goals "get my balance" and "be able to improve my ability to talk without stuttering"   Currently in Pain? No/denies            [facility] PT Assessment - 02/14/15 1037    Assessment   Medical Diagnosis complicated migraine, abnormality of gait   Onset Date/Surgical Date --  09/2014   Prior Therapy seen in the past for vertigo   Precautions   Precautions Fall    Balance Screen   Has the patient fallen in the past 6 months No   Has the patient had a decrease in activity level because of a fear of falling?  Yes   Is the patient reluctant to leave their home because of a fear of falling?  Yes   [place] Private residence   Living Arrangements Spouse/significant other   Type of [place] Access Elevator;Stairs to enter   Entrance Stairs-Number of Steps --  holds onto wall/rails   Home  Layout Multi-level   Alternate Level Stairs-Number of Steps --  has rails   Prior Function   Level of Independence Independent with gait;Independent with community mobility without device   Vocation Retired   Observation/Other Assessments   Focus on Therapeutic Outcomes (FOTO)  45%   Other Surveys  --  ABC scale 5.6%   Sensation   Light Touch Appears Intact  has occasional R hand/finger sensation changes   Additional Comments Vision is limited by R cateracts and double vision and impaired depth perception   ROM / Strength   AROM / PROM / Strength AROM;Strength   AROM   Overall AROM  Within functional limits for tasks performed   Strength   Overall Strength Comments LEs 4/5 bilateral hip flexion, 5/5 bilateral knee flexion/extension, and 5/5 bilateral ankle dorsiflexion   5/5 bilateral shoulder flexion/abduction and elbow flex/ext   Ambulation/Gait   Ambulation/Gait Yes   Ambulation/Gait Assistance 6: Modified independent (Device/Increase time)  slowed pace and holds walls   Ambulation Distance (Feet) 200 Feet   Assistive device None   Gait Pattern Decreased stride length;Narrow base of support  decreased arm swing, rigid trunk, no push-off with gait   Ambulation Surface Level   Gait velocity 1.51 ft/sec   Stairs Yes   Stairs Assistance 6: Modified independent (Device/Increase time)   Stair Management Technique Alternating pattern;Two rails   Number of Stairs --  4   Standardized Balance Assessment   Standardized  Balance Assessment Berg Balance Test   Berg Balance Test   Sit to Stand Able to stand without using hands and stabilize independently   Standing Unsupported Able to stand 2 minutes with supervision   Sitting with Back Unsupported but Feet Supported on Floor or Stool Able to sit safely and securely 2 minutes   Stand to Sit Sits safely with minimal use of hands   Transfers Able to transfer safely, definite need of hands   Standing Unsupported with Eyes Closed Able to stand 10 seconds with supervision   Standing Ubsupported with Feet Together Able to place feet together independently and stand for 1 minute with supervision   From Standing, Reach Forward with Outstretched Arm Can reach forward >12 cm safely (5")   From Standing Position, Pick up Object from [place] to pick up shoe, needs supervision   From Standing Position, Turn to Look Behind Over each Shoulder Turn sideways only but maintains balance   Turn 360 Degrees Needs close supervision or verbal cueing   Standing Unsupported, Alternately Place Feet on Step/Stool Needs assistance to keep from falling or unable to try   Standing Unsupported, One Foot in Front Able to take small step independently and hold 30 seconds   Standing on One Leg Unable to try or needs assist to prevent fall   Total Score 35   Berg comment: patient became anxious during balance testing and requested a rest break           PT Short Term Goals - 02/14/15 1425    PT SHORT TERM GOAL #1   Title The patient will be indep with HEP for balance and general mobility.   Baseline Target date 03/17/2015   Time 4   Period Weeks   PT SHORT TERM GOAL #2   Title The patient will improve Berg from 35/56 up to > or equal to 40/56 to demo decreasing risk for falls.   Baseline Target date 03/17/2015   Time 4   Period Weeks   PT SHORT  TERM GOAL #3   Title The patient will increase gait speed from 1.51 ft/sec to > or equal to 1.8 to demo decreased risk for falls.    Baseline Target date 03/17/2015   Time 4   Period Weeks   PT SHORT TERM GOAL #4   Title The patient will ambulate modified indep on level surfaces x 300 ft for improved household mobility.   Baseline Target date 03/17/2015   Time 4   Period Weeks           PT Long Term Goals - 2015/03/07 1426    PT LONG TERM GOAL #1   Title The patient will be indep with progression of HEP for post d/c.   Baseline Target date 04/16/2015   Time 8   Period Weeks   PT LONG TERM GOAL #2   Title The patient will improve Berg from 35/56 to > or equal to 44/56 to demo decreasing risk for falls.   Baseline Target date 04/16/2015   Time 8   Period Weeks   PT LONG TERM GOAL #3   Title The patient will improve gait speed from 1.51 ft/sec to > or equal to 2.2 ft/sec to demo improved functional mobility.   Baseline Target date 04/16/2015   Time 8   Period Weeks   PT LONG TERM GOAL #4   Title The patient will improve activities of balance confidence scale from 5.6% to > or equal to 20% for improved self perception of balance.   Baseline Target date 04/16/2015   Time 8   Period Weeks               Plan - 03-07-15 1428    Clinical Impression Statement The patient is a76 yo female with recent decline in status with fear of falling.  PT to address general mobility and balance deficits to optimize current functional status.   Pt will benefit from skilled therapeutic intervention in order to improve on the following deficits Abnormal gait;Decreased balance;Decreased mobility;Decreased strength;Postural dysfunction;Difficulty walking   Rehab Potential Good   PT Frequency 2x / week   PT Duration 8 weeks   PT Treatment/Interventions Neuromuscular re-education;Balance training;Patient/family education;Gait training;Stair training;Therapeutic activities;Therapeutic exercise;Functional mobility training;ADLs/Self Care Home Management   PT Next Visit Plan Add HEP for standing balance near support surface, improve  trunk rotation with arm swing with gait.   Consulted and Agree with Plan of Care Patient          G-Codes - March 07, 2015 1430    Functional Assessment Tool Used Berg=35/56, gait speed=1.51 ft/sec   Functional Limitation Mobility: Walking and moving around   Mobility: Walking and Moving Around Current Status [phone]) At least 20 percent but less than 40 percent impaired, limited or restricted   Mobility: Walking and Moving Around Goal Status [phone]) At least 1 percent but less than 20 percent impaired, limited or restricted       Problem List Patient Active Problem List   Diagnosis Date Noted  . Monoallelic mutation of CHEK2 gene 02/02/2015  . Genetic testing 01/31/2015  . Altered mental status 10/25/2014  . Occipital neuralgia of left side   . Stroke ([place]) 10/07/2014  . History of permanent cardiac pacemaker placement 10/07/2014  . Cephalalgia   . Complicated migraine   . HLD (hyperlipidemia)   . Headache 10/06/2014  . Arthritis of left hip 06/12/2013  . Status post THR (total hip replacement) 06/12/2013  . Constipation 05/25/2013  . Orthostatic hypotension 01/27/2013  . crosstalk-atrial lead  01/27/2013  . Pre-syncope 01/02/2013  . Sinus tachycardia ([place]) 01/01/2013  . Second degree heart block 12/27/2012  . Hypothyroidism 12/27/2012  . Spigelian hernia 04/20/2011  . DYSPNEA 05/09/2010  . BREAST CANCER 05/05/2010  . ASTHMA 05/05/2010  . HIATAL HERNIA 05/05/2010  . DEGENERATIVE Disk DISEASE 05/05/2010  . OSTEOPENIA 05/05/2010    WEAVER,CHRISTINA, PT 02/14/2015, 2:31 PM  [facility] [address] [place], [place], [address] Phone: [phone]   Fax:  [phone]

## 2015-02-17 ENCOUNTER — Encounter: Payer: Medicare Other | Admitting: Internal Medicine

## 2015-02-18 ENCOUNTER — Encounter (HOSPITAL_COMMUNITY): Payer: Self-pay

## 2015-02-22 ENCOUNTER — Ambulatory Visit (INDEPENDENT_AMBULATORY_CARE_PROVIDER_SITE_OTHER): Payer: Medicare Other | Admitting: *Deleted

## 2015-02-22 DIAGNOSIS — J309 Allergic rhinitis, unspecified: Secondary | ICD-10-CM

## 2015-02-24 ENCOUNTER — Ambulatory Visit: Payer: Medicare Other | Admitting: Internal Medicine

## 2015-02-24 ENCOUNTER — Ambulatory Visit (INDEPENDENT_AMBULATORY_CARE_PROVIDER_SITE_OTHER): Payer: Medicare Other | Admitting: Internal Medicine

## 2015-02-24 ENCOUNTER — Encounter: Payer: Self-pay | Admitting: Internal Medicine

## 2015-02-24 VITALS — BP 136/70 | HR 63 | Ht 67.0 in | Wt 149.0 lb

## 2015-02-24 DIAGNOSIS — I441 Atrioventricular block, second degree: Secondary | ICD-10-CM

## 2015-02-24 DIAGNOSIS — Z950 Presence of cardiac pacemaker: Secondary | ICD-10-CM

## 2015-02-24 DIAGNOSIS — I442 Atrioventricular block, complete: Secondary | ICD-10-CM | POA: Diagnosis not present

## 2015-02-24 DIAGNOSIS — I639 Cerebral infarction, unspecified: Secondary | ICD-10-CM | POA: Diagnosis not present

## 2015-02-24 LAB — CUP PACEART INCLINIC DEVICE CHECK
Battery Voltage: 3.01 V
Brady Statistic AP VP Percent: 18.38 %
Brady Statistic AS VP Percent: 81.56 %
Brady Statistic AS VS Percent: 0.06 %
Brady Statistic RA Percent Paced: 18.38 %
Date Time Interrogation Session: 20161013191801
Implantable Lead Implant Date: 20140818
Implantable Lead Location: 753860
Implantable Lead Model: 5076
Lead Channel Impedance Value: 304 Ohm
Lead Channel Impedance Value: 437 Ohm
Lead Channel Impedance Value: 608 Ohm
Lead Channel Pacing Threshold Amplitude: 0.75 V
Lead Channel Pacing Threshold Pulse Width: 0.4 ms
Lead Channel Setting Pacing Pulse Width: 0.4 ms
Lead Channel Setting Sensing Sensitivity: 0.9 mV
MDC IDC LEAD IMPLANT DT: 20140818
MDC IDC LEAD LOCATION: 753859
MDC IDC MSMT BATTERY REMAINING LONGEVITY: 90 mo
MDC IDC MSMT LEADCHNL RA SENSING INTR AMPL: 1.625 mV
MDC IDC MSMT LEADCHNL RV IMPEDANCE VALUE: 741 Ohm
MDC IDC MSMT LEADCHNL RV PACING THRESHOLD AMPLITUDE: 0.75 V
MDC IDC MSMT LEADCHNL RV PACING THRESHOLD PULSEWIDTH: 0.4 ms
MDC IDC SET LEADCHNL RA PACING AMPLITUDE: 2 V
MDC IDC SET LEADCHNL RV PACING AMPLITUDE: 2.5 V
MDC IDC SET ZONE DETECTION INTERVAL: 400 ms
MDC IDC STAT BRADY AP VS PERCENT: 0 %
MDC IDC STAT BRADY RV PERCENT PACED: 99.94 %
Zone Setting Detection Interval: 400 ms

## 2015-02-24 NOTE — Patient Instructions (Signed)
Medication Instructions: - no changes  Labwork: - none  Procedures/Testing: - none  Follow-Up: - Remote monitoring is used to monitor your Pacemaker of ICD from home. This monitoring reduces the number of office visits required to check your device to one time per year. It allows [place] to keep an eye on the functioning of your device to ensure it is working properly. You are scheduled for a device check from home on 05/26/15. You may send your transmission at any time that day. If you have a wireless device, the transmission will be sent automatically. After your physician reviews your transmission, you will receive a postcard with your next transmission date.  - Your physician wants you to follow-up in: 1 year with Dr. Caryl Comes. You will receive a reminder letter in the mail two months in advance. If you don't receive a letter, please call our office to schedule the follow-up appointment.  Any Additional Special Instructions Will Be Listed Below (If Applicable).

## 2015-02-24 NOTE — Progress Notes (Signed)
Patient Care Team: Burnard Bunting, MD as PCP - General (Internal Medicine)   HPI  [name] is a 69 y.o. female Seen in followup for high grade heart block for which she underwent pacing compllicated by microperforation modest effusion and then adderall withdrawal   She is far  better following reprogramming of the device after we noted upper rate behavior limiting heart rate excursion she still has complaints sometimes and exercise intolerance on stairs  Was continued to be an issue. sHe underwent hip replacement surgery and and physical therapy has been slow and modest because of problems with an elevated heart rate. sHe has a blood pressure machine at home which he brings in today and demonstrates multiple blood pressures in the 85-95 range with heart rates in the 90s. She says this occurs at the top of the stairs and is associated with a pounding sensation.  She reminded that she has a diagnosis of central polyneuropathy;  she was hospitalized 5/16 because of transient visual weakness and facial droop; evaluation was negative. Discharge diagnosis was complex migraine MRI images were apparently negative for damage. There is minimal residual persistent deficits which have been described by her various physicians to "low blood pressure" there comments regarding "sustained atrial fibrillation" had been following with Drs. neurology [facility] and with Dr. [name].  She also has complaints of irregular palpitations that occur after she goes to the bathroom at night when she returns to bed   In 12/14 she underwent stress testing and was found to have upper rate behaviour at 120 which we reprogrammed around and this was incompletely helpful     Past Medical History  Diagnosis Date  . Diverticulosis   . GERD (gastroesophageal reflux disease)   . Esophageal stricture   . Hemorrhoids   . Mallory - Weiss tear     HEALED   . Interstitial cystitis   . ADHD (attention deficit  hyperactivity disorder)   . Breast cancer ([place])     right side  . Blood transfusion   . Thyroid disease   . Allergy     trees/pollen, mold, fungus, dust mites. Takes allergy shots  . Latex allergy, contact dermatitis   . Cataract   . Depression   . Hernia of abdominal wall     spigelian hernia RLQ - SURGERY TO REPAIR  . H/O hiatal hernia   . Neuromuscular disorder (HCC)     central nervous system neuropathy- seen per Dr Erling Cruz  . Asthma     allergist Dr Olena Heckle- monthly allergy injections  . Complication of anesthesia     reaction to some anesthetics/ 7/12 anesth record on chart- states prefers epidural  . PONV (postoperative nausea and vomiting)     pt needs scop patch  . Recurrent upper respiratory infection (URI) 1/13- to present    bronchitis following surgery- states improved but still with cough. OV with [facility] Dr [name] 09/06/11 on chart  . Symptomatic bradycardia     a. 12/2012 s/p MDT dual chamber PPM, ser # [id number]; b. 12/2012 post-op course complicated by pericardial effusinon req lead revision.  . Pericardial effusion     a. 12/2012 following ppm placement;  b. 01/01/2013 Echo: EF 55-60%, small pericardial effusion w/o RV collapse-->No need for tap/window.  . Chest pain     a. 12/2012 Cath: nl cors, EF 55-65%.  . Autonomic dysfunction     CENTRAL NERVOUS SYSTEM NEUROPATHY - DX BY DR. Erling Cruz MORE THAN 10 YRS AGO -  AND IT IS FELT TO CONTRIBUTE TO THE AUTOMIC  DYSFUNCTION-- PT HAS NUMBNESS LEGS AND FEET AND SOMETIIMES TIPS OF FINGER, SEVERE CONSTIPATION( NO SENSATION TO HAVE BM ), DOUBLE VISION, ORTHOSTATIC HYPOTENSION  . Hypothyroidism   . Bruises easily   . Pacemaker   . Dysrhythmia     HX OF HIGH GRADE HEART BLOCK - REQUIRED PACEMAKER INSERTION  . Shortness of breath     AT TIMES - BUT MUCH IMPROVED AFTER PACEMAKER WAS REPROGRAMED.  . Arthritis     PAIN AND OA LEFT HIP  . CHF (congestive heart failure) ([place])   . Gastritis     Past Surgical History  Procedure  Laterality Date  . Mastectomy modified radical      right; with immediate reconstruction  . Laparoscopy  1973  . Abdominal hysterectomy  1974  . Cystoscopy  1975, 2007  . Myringoplasty  1962  . Tympanoplasty  1973    right  . Oophorectomy  1982  . Bladder suspension    . Rectocele repair      with cystocele repair  . Total hip arthroplasty Right   . Ventral hernia repair  05/30/2011    Procedure: HERNIA REPAIR VENTRAL ADULT;  Surgeon: Haywood Lasso, MD;  Location: [facility];  Service: General;  Laterality: Right;  repair right spigelian hernia  . Breast biopsy  2002    NO BLOOD PRESSURES ON RIGHT SIDE/   s/p  axillary node dissection  . Tonsillectomy    . Back surgery      cervical fusion 4-5 with plate  . Cysto with hydrodistension  09/07/2011    Procedure: CYSTOSCOPY/HYDRODISTENSION;  Surgeon: Ailene Rud, MD;  Location: [facility];  Service: Urology;  Laterality: N/A;  INSTILLATION OF MARCAINE/PYRIDIUM INSTILLATION OF MARCAINE/KENALOG  . Pacemaker insertion    . Eye surgery      LASIK EYE SURGERY BILATERAL  . Ulnar nerve transposition Right   . Total hip arthroplasty Left 06/12/2013    Procedure: LEFT TOTAL HIP ARTHROPLASTY ANTERIOR APPROACH;  Surgeon: Mcarthur Rossetti, MD;  Location: [facility];  Service: Orthopedics;  Laterality: Left;  . Permanent pacemaker insertion N/A 12/29/2012    Procedure: PERMANENT PACEMAKER INSERTION;  Surgeon: Deboraha Sprang, MD;  Location: [facility] C[facility];  Service: Cardiovascular;  Laterality: N/A;  . Left heart catheterization with coronary angiogram N/A 12/29/2012    Procedure: LEFT HEART CATHETERIZATION WITH CORONARY ANGIOGRAM;  Surgeon: Peter M Martinique, MD;  Location: [facility] CATH LAB;  Service: Cardiovascular;  Laterality: N/A;  . Lead revision N/A 12/30/2012    Procedure: LEAD REVISION;  Surgeon: Evans Lance, MD;  Location: [facility] CATH LAB;  Service: Cardiovascular;  Laterality: N/A;  . Neck surgery      c4-5 ruptured disc     Current Outpatient Prescriptions  Medication Sig Dispense Refill  . acetaminophen (TYLENOL) 325 MG tablet Take 2 tablets (650 mg total) by mouth every 6 (six) hours as needed for mild pain. 30 tablet 0  . albuterol (PROVENTIL HFA;VENTOLIN HFA) 108 (90 BASE) MCG/ACT inhaler Inhale 2 puffs into the lungs every 6 (six) hours as needed. For shortness of breath    . aspirin EC 81 MG tablet Take 1 tablet (81 mg total) by mouth daily. 30 tablet 0  . b complex vitamins capsule Take 1 capsule by mouth daily.     . cholecalciferol (VITAMIN D) 1000 UNITS tablet Take 1,000 Units by mouth daily.    . ciclesonide (OMNARIS) 50 MCG/ACT nasal  spray Place 2 sprays into both nostrils as needed for allergies.     . cyanocobalamin 2000 MCG tablet Take 2,000 mcg by mouth daily.    Marland Kitchen docusate sodium (COLACE) 100 MG capsule Take 200 mg by mouth every evening.     Marland Kitchen EPINEPHrine (EPIPEN 2-PAK) 0.3 mg/0.3 mL IJ SOAJ injection Inject 0.3 mg into the muscle once.    . escitalopram (LEXAPRO) 10 MG tablet Take 10 mg by mouth daily.    Marland Kitchen esomeprazole (NEXIUM) 40 MG capsule Take 20 mg by mouth daily.     Marland Kitchen estradiol (ESTRING) 2 MG vaginal ring Place 2 mg vaginally every 3 (three) months. follow package directions    . estradiol (VIVELLE-DOT) 0.025 MG/24HR Place 1 patch onto the skin 2 (two) times a week. Applies new patch on Sunday & Thursday    . fexofenadine (ALLEGRA) 180 MG tablet Take 180 mg by mouth daily.     Marland Kitchen levothyroxine (SYNTHROID, LEVOTHROID) 100 MCG tablet Take 100 mcg by mouth daily before breakfast.    . magnesium oxide (MAG-OX) 400 MG tablet Take 400 mg by mouth daily.    . midodrine (PROAMATINE) 10 MG tablet Take 1 tablet (10 mg total) by mouth 3 (three) times daily with meals. 60 tablet 0  . midodrine (PROAMATINE) 5 MG tablet Take 5 mg by mouth daily.    . mometasone-formoterol (DULERA) 200-5 MCG/ACT AERO Inhale 2 puffs into the lungs 2 (two) times daily.    . montelukast (SINGULAIR) 10 MG tablet Take 10  mg by mouth as needed (allergies).     . ondansetron (ZOFRAN ODT) 4 MG disintegrating tablet Take 1 tablet (4 mg total) by mouth every 8 (eight) hours as needed for nausea or vomiting. 12 tablet 2  . potassium chloride (KLOR-CON M10) 10 MEQ tablet Take 1 tablet (10 mEq total) by mouth daily. 30 tablet 6  . PRESCRIPTION MEDICATION ALLERGY SHOT EVERY 2 WEEKS BY DR. Neldon Mc    . Probiotic Product (ALIGN) 4 MG CAPS Take 4 mg by mouth at bedtime.     . pyridostigmine (MESTINON) 60 MG tablet Take 30 mg by mouth 3 (three) times daily.     No current facility-administered medications for this visit.   Facility-Administered Medications Ordered in Other Visits  Medication Dose Route Frequency Provider Last Rate Last Dose  . bupivacaine (MARCAINE) 0.5 % 10 mL, triamcinolone acetonide (KENALOG-40) 40 mg injection   Subcutaneous Once Carolan Clines, MD      . bupivacaine (MARCAINE) 0.5 % 15 mL, phenazopyridine (PYRIDIUM) 400 mg bladder mixture   Bladder Instillation Once Carolan Clines, MD        Allergies  Allergen Reactions  . Anesthetics, Amide Other (See Comments)    Patient unsure of names, however multiples cause swelling of airway & nausea  . Clindamycin Swelling  . Neomycin Swelling  . Shellfish Allergy Other (See Comments)    Neurotoxic reaction  . Sulfonamide Derivatives Anaphylaxis and Swelling  . Zolpidem Tartrate Other (See Comments)    Jerking motions   . Azithromycin Other (See Comments)    Severe gastritis   . Bactroban Other (See Comments)    Causes sores in nose  . Ciprofloxacin     REACTION: joint swelling  . Codeine Swelling  . Meperidine Hcl Nausea Only    Hallucinations   . Morphine Nausea Only    Hallucinations   . Neosporin [Neomycin-Polymyxin-Gramicidin] Hives and Dermatitis    All topical "orin's ointment"  . Nitrofurantoin  REACTION: neuropathy in legs  . Other     ALLERGIC TO WARM WATER SHELLFISH  PT STATES PLEASE NOTE SHE CAN TAKE OXYCODONE AND  TYLENOL FOR PAIN -NOT ALLERGIC  . Penicillins Other (See Comments)    Swelling in joints  . Tramadol     Makes jerk  . Latex Rash    Review of Systems negative except from HPI and PMH  Physical Exam BP 136/70 mmHg  Pulse 63  Ht 5\' 7"  (1.702 m)  Wt 149 lb (67.586 kg)  BMI 23.33 kg/m2 Well developed and well nourished in no acute distress HENT normal E scleral and icterus clear Neck Supple JVP flat; carotids brisk and full Clear to ausculation  Regular rate and rhythm, no murmurs gallops or rub Soft with active bowel sounds No clubbing cyanosis no Edema Alert and oriented, grossly normal motor and sensory function Skin Warm and Dry    Assessment and  Plan  High-grade heart block  Pacemaker-[employer]tronic  Exercise associated hypotension  ? Dysautonomia  Nocturnal palpitations  Central polyneuropathy    She had an episode in May that is quite confusing. She has residual neurological deficits manifested by speech. There are comments that she ascribes to other doctors regarding "lack of oxygen" "sustained atrial fibrillation" none of which I understand. I have told her that I will not be able to help in clarifying these comments and that she should follow-up with Drs. Willis and  [employer] and we will be glad to follow her for pacemaker  Nocturnal palpitations may be PVCs. Device counter suggested there is some absolute number of beats less than 0.1% Mary in she is sensed. These could represent PVCs.

## 2015-02-25 ENCOUNTER — Ambulatory Visit: Payer: Medicare Other | Admitting: Rehabilitative and Restorative Service Providers"

## 2015-02-25 DIAGNOSIS — L821 Other seborrheic keratosis: Secondary | ICD-10-CM | POA: Diagnosis not present

## 2015-02-25 DIAGNOSIS — L738 Other specified follicular disorders: Secondary | ICD-10-CM | POA: Diagnosis not present

## 2015-02-25 DIAGNOSIS — L570 Actinic keratosis: Secondary | ICD-10-CM | POA: Diagnosis not present

## 2015-02-28 ENCOUNTER — Ambulatory Visit: Payer: Medicare Other | Admitting: Rehabilitative and Restorative Service Providers"

## 2015-02-28 DIAGNOSIS — R269 Unspecified abnormalities of gait and mobility: Secondary | ICD-10-CM

## 2015-02-28 NOTE — Therapy (Signed)
[facility] [address] [place], [place], [address] Phone: [phone]   Fax:  [phone]  Physical Therapy Treatment  Patient Details  Name: [name] MRN: [id number] Date of Birth: 12/20/1945 No Data Recorded  Encounter Date: 02/28/2015      PT End of Session - 02/28/15 1149    Visit Number 2   Number of Visits 16   Date for PT Re-Evaluation 04/16/15   Authorization Type G code every 10th visit   PT Start Time 1106   PT Stop Time 1148   PT Time Calculation (min) 42 min   Equipment Utilized During Treatment Gait belt   Activity Tolerance Patient tolerated treatment well   Behavior During Therapy Anxious      Past Medical History  Diagnosis Date  . Diverticulosis   . GERD (gastroesophageal reflux disease)   . Esophageal stricture   . Hemorrhoids   . Mallory - Weiss tear     HEALED   . Interstitial cystitis   . ADHD (attention deficit hyperactivity disorder)   . Breast cancer ([place])     right side  . Blood transfusion   . Thyroid disease   . Allergy     trees/pollen, mold, fungus, dust mites. Takes allergy shots  . Latex allergy, contact dermatitis   . Cataract   . Depression   . Hernia of abdominal wall     spigelian hernia RLQ - SURGERY TO REPAIR  . H/O hiatal hernia   . Neuromuscular disorder (HCC)     central nervous system neuropathy- seen per Dr Erling Cruz  . Asthma     allergist Dr Olena Heckle- monthly allergy injections  . Complication of anesthesia     reaction to some anesthetics/ 7/12 anesth record on chart- states prefers epidural  . PONV (postoperative nausea and vomiting)     pt needs scop patch  . Recurrent upper respiratory infection (URI) 1/13- to present    bronchitis following surgery- states improved but still with cough. OV with [facility] Dr [name] 09/06/11 on chart  . Symptomatic bradycardia     a. 12/2012 s/p MDT dual chamber PPM, ser # [id number]; b. 12/2012 post-op course  complicated by pericardial effusinon req lead revision.  . Pericardial effusion     a. 12/2012 following ppm placement;  b. 01/01/2013 Echo: EF 55-60%, small pericardial effusion w/o RV collapse-->No need for tap/window.  . Chest pain     a. 12/2012 Cath: nl cors, EF 55-65%.  . Autonomic dysfunction     CENTRAL NERVOUS SYSTEM NEUROPATHY - DX BY DR. Erling Cruz MORE THAN 10 YRS AGO - AND IT IS FELT TO CONTRIBUTE TO THE AUTOMIC  DYSFUNCTION-- PT HAS NUMBNESS LEGS AND FEET AND SOMETIIMES TIPS OF FINGER, SEVERE CONSTIPATION( NO SENSATION TO HAVE BM ), DOUBLE VISION, ORTHOSTATIC HYPOTENSION  . Hypothyroidism   . Bruises easily   . Pacemaker   . Dysrhythmia     HX OF HIGH GRADE HEART BLOCK - REQUIRED PACEMAKER INSERTION  . Shortness of breath     AT TIMES - BUT MUCH IMPROVED AFTER PACEMAKER WAS REPROGRAMED.  . Arthritis     PAIN AND OA LEFT HIP  . CHF (congestive heart failure) ([place])   . Gastritis     Past Surgical History  Procedure Laterality Date  . Mastectomy modified radical      right; with immediate reconstruction  . Laparoscopy  1973  . Abdominal hysterectomy  1974  . Cystoscopy  1975, 2007  .  Myringoplasty  1962  . Tympanoplasty  1973    right  . Oophorectomy  1982  . Bladder suspension    . Rectocele repair      with cystocele repair  . Total hip arthroplasty Right   . Ventral hernia repair  05/30/2011    Procedure: HERNIA REPAIR VENTRAL ADULT;  Surgeon: Haywood Lasso, MD;  Location: [facility];  Service: General;  Laterality: Right;  repair right spigelian hernia  . Breast biopsy  2002    NO BLOOD PRESSURES ON RIGHT SIDE/   s/p  axillary node dissection  . Tonsillectomy    . Back surgery      cervical fusion 4-5 with plate  . Cysto with hydrodistension  09/07/2011    Procedure: CYSTOSCOPY/HYDRODISTENSION;  Surgeon: Ailene Rud, MD;  Location: [facility];  Service: Urology;  Laterality: N/A;  INSTILLATION OF MARCAINE/PYRIDIUM INSTILLATION OF  MARCAINE/KENALOG  . Pacemaker insertion    . Eye surgery      LASIK EYE SURGERY BILATERAL  . Ulnar nerve transposition Right   . Total hip arthroplasty Left 06/12/2013    Procedure: LEFT TOTAL HIP ARTHROPLASTY ANTERIOR APPROACH;  Surgeon: Mcarthur Rossetti, MD;  Location: [facility];  Service: Orthopedics;  Laterality: Left;  . Permanent pacemaker insertion N/A 12/29/2012    Procedure: PERMANENT PACEMAKER INSERTION;  Surgeon: Deboraha Sprang, MD;  Location: [facility] CATH LAB;  Service: Cardiovascular;  Laterality: N/A;  . Left heart catheterization with coronary angiogram N/A 12/29/2012    Procedure: LEFT HEART CATHETERIZATION WITH CORONARY ANGIOGRAM;  Surgeon: Peter M Martinique, MD;  Location: [facility] CATH LAB;  Service: Cardiovascular;  Laterality: N/A;  . Lead revision N/A 12/30/2012    Procedure: LEAD REVISION;  Surgeon: Evans Lance, MD;  Location: [facility] CATH LAB;  Service: Cardiovascular;  Laterality: N/A;  . Neck surgery      c4-5 ruptured disc    There were no vitals filed for this visit.  Visit Diagnosis:  Abnormality of gait      Subjective Assessment - 02/28/15 1111    Subjective The patient reports that she saw electrophysiologist since our last session.     Patient Stated Goals "get my balance" and "be able to improve my ability to talk without stuttering"   Currently in Pain? No/denies          [facility] Adult PT Treatment/Exercise - 02/28/15 1113    Ambulation/Gait   Ambulation/Gait Yes   Ambulation/Gait Assistance 5: Supervision   Ambulation Distance (Feet) 345 Feet  x 3 reps with cues on arm swing and trunk rotation   Assistive device None   Gait Pattern Decreased stride length;Narrow base of support  decreased arm swing, rigid trunk, no push-off with gait   Ambulation Surface Level   Neuro Re-ed    Neuro Re-ed Details  Corner balance exercises standing with wider base of support with trunk rotation R/L x 5 reps, then eyes closed x 10 sec intervals with supervision x 3 reps, then  feet together with eyes open.  Also performed standing with head turns and feet apart x 5 reps.  The patient performed standing in stride with UE reaching and anterior/posterior weight shifting for improving balance control, also performed lateral weight shifting with reaching tasks to encourage trunk rotation with postural control.          PT Education - 02/28/15 1134    Education provided Yes   Education Details HEP: corner balance feet apart eyes closed, feet apart head  turns, feet together eyes open   Person(s) Educated Patient   Methods Explanation;Demonstration;Handout   Comprehension Verbalized understanding;Returned demonstration          PT Short Term Goals - 02/14/15 1425    PT SHORT TERM GOAL #1   Title The patient will be indep with HEP for balance and general mobility.   Baseline Target date 03/17/2015   Time 4   Period Weeks   PT SHORT TERM GOAL #2   Title The patient will improve Berg from 35/56 up to > or equal to 40/56 to demo decreasing risk for falls.   Baseline Target date 03/17/2015   Time 4   Period Weeks   PT SHORT TERM GOAL #3   Title The patient will increase gait speed from 1.51 ft/sec to > or equal to 1.8 to demo decreased risk for falls.   Baseline Target date 03/17/2015   Time 4   Period Weeks   PT SHORT TERM GOAL #4   Title The patient will ambulate modified indep on level surfaces x 300 ft for improved household mobility.   Baseline Target date 03/17/2015   Time 4   Period Weeks           PT Long Term Goals - 02/14/15 1426    PT LONG TERM GOAL #1   Title The patient will be indep with progression of HEP for post d/c.   Baseline Target date 04/16/2015   Time 8   Period Weeks   PT LONG TERM GOAL #2   Title The patient will improve Berg from 35/56 to > or equal to 44/56 to demo decreasing risk for falls.   Baseline Target date 04/16/2015   Time 8   Period Weeks   PT LONG TERM GOAL #3   Title The patient will improve gait speed from 1.51  ft/sec to > or equal to 2.2 ft/sec to demo improved functional mobility.   Baseline Target date 04/16/2015   Time 8   Period Weeks   PT LONG TERM GOAL #4   Title The patient will improve activities of balance confidence scale from 5.6% to > or equal to 20% for improved self perception of balance.   Baseline Target date 04/16/2015   Time 8   Period Weeks           Plan - 02/28/15 2218    Clinical Impression Statement The patient continues with guarded gait posture due to fear of falling.  PT to progress exercises and balance activities to patient tolerance.    PT Next Visit Plan Check HEP, progress giat and balance with emphasis on increasing confidence        Problem List Patient Active Problem List   Diagnosis Date Noted  . Monoallelic mutation of CHEK2 gene 02/02/2015  . Genetic testing 01/31/2015  . Altered mental status 10/25/2014  . Occipital neuralgia of left side   . Stroke ([place]) 10/07/2014  . History of permanent cardiac pacemaker placement 10/07/2014  . Cephalalgia   . Complicated migraine   . HLD (hyperlipidemia)   . Headache 10/06/2014  . Arthritis of left hip 06/12/2013  . Status post THR (total hip replacement) 06/12/2013  . Constipation 05/25/2013  . Orthostatic hypotension 01/27/2013  . crosstalk-atrial lead 01/27/2013  . Pre-syncope 01/02/2013  . Sinus tachycardia ([place]) 01/01/2013  . Second degree heart block 12/27/2012  . Hypothyroidism 12/27/2012  . Spigelian hernia 04/20/2011  . DYSPNEA 05/09/2010  . BREAST CANCER 05/05/2010  . ASTHMA 05/05/2010  .  HIATAL HERNIA 05/05/2010  . DEGENERATIVE Disk DISEASE 05/05/2010  . OSTEOPENIA 05/05/2010    WEAVER,CHRISTINA, PT 02/28/2015, 10:25 PM  [facility] [address] [place], [place], [address] Phone: [phone]   Fax:  [phone]  Name: [name] MRN: [id number] Date of Birth: [birth year]

## 2015-02-28 NOTE — Patient Instructions (Signed)
Feet Apart, Varied Arm Positions - Eyes Closed    Stand with feet shoulder width apart and arms out. Close eyes and visualize upright position. Hold __10__ seconds. Repeat __3__ times per session. Do __1-2__ sessions per day.  Copyright  VHI. All rights reserved.  Feet Apart, Head Motion - Eyes Open    With eyes open, feet apart, move head slowly: side to side. Repeat _5___ times per session. Do __1-2__ sessions per day.  Copyright  VHI. All rights reserved.  Feet Together, Varied Arm Positions - Eyes Open    With eyes open, feet together, arms at your side, look straight ahead at a stationary object. Hold __30__ seconds. Repeat __3__ times per session. Do _1-2___ sessions per day.  Copyright  VHI. All rights reserved.

## 2015-03-02 ENCOUNTER — Ambulatory Visit: Payer: Medicare Other | Admitting: Rehabilitative and Restorative Service Providers"

## 2015-03-02 DIAGNOSIS — R269 Unspecified abnormalities of gait and mobility: Secondary | ICD-10-CM

## 2015-03-02 NOTE — Therapy (Signed)
[facility] [address] [place], [place], [address] Phone: [phone]   Fax:  [phone]  Physical Therapy Treatment  Patient Details  Name: [name] MRN: [id number] Date of Birth: 07-25-1945 No Data Recorded  Encounter Date: 03/02/2015      PT End of Session - 03/02/15 1101    Visit Number 3   Number of Visits 16   Date for PT Re-Evaluation 04/16/15   Authorization Type G code every 10th visit   PT Start Time 1020   PT Stop Time 1103   PT Time Calculation (min) 43 min   Equipment Utilized During Treatment Gait belt   Activity Tolerance Patient tolerated treatment well   Behavior During Therapy Anxious      Past Medical History  Diagnosis Date  . Diverticulosis   . GERD (gastroesophageal reflux disease)   . Esophageal stricture   . Hemorrhoids   . Mallory - Weiss tear     HEALED   . Interstitial cystitis   . ADHD (attention deficit hyperactivity disorder)   . Breast cancer ([place])     right side  . Blood transfusion   . Thyroid disease   . Allergy     trees/pollen, mold, fungus, dust mites. Takes allergy shots  . Latex allergy, contact dermatitis   . Cataract   . Depression   . Hernia of abdominal wall     spigelian hernia RLQ - SURGERY TO REPAIR  . H/O hiatal hernia   . Neuromuscular disorder (HCC)     central nervous system neuropathy- seen per Dr Erling Cruz  . Asthma     allergist Dr Olena Heckle- monthly allergy injections  . Complication of anesthesia     reaction to some anesthetics/ 7/12 anesth record on chart- states prefers epidural  . PONV (postoperative nausea and vomiting)     pt needs scop patch  . Recurrent upper respiratory infection (URI) 1/13- to present    bronchitis following surgery- states improved but still with cough. OV with Clearance Dr [name] 09/06/11 on chart  . Symptomatic bradycardia     a. 12/2012 s/p MDT dual chamber PPM, ser # [id number]; b. 12/2012 post-op course  complicated by pericardial effusinon req lead revision.  . Pericardial effusion     a. 12/2012 following ppm placement;  b. 01/01/2013 Echo: EF 55-60%, small pericardial effusion w/o RV collapse-->No need for tap/window.  . Chest pain     a. 12/2012 Cath: nl cors, EF 55-65%.  . Autonomic dysfunction     CENTRAL NERVOUS SYSTEM NEUROPATHY - DX BY DR. Erling Cruz MORE THAN 10 YRS AGO - AND IT IS FELT TO CONTRIBUTE TO THE AUTOMIC  DYSFUNCTION-- PT HAS NUMBNESS LEGS AND FEET AND SOMETIIMES TIPS OF FINGER, SEVERE CONSTIPATION( NO SENSATION TO HAVE BM ), DOUBLE VISION, ORTHOSTATIC HYPOTENSION  . Hypothyroidism   . Bruises easily   . Pacemaker   . Dysrhythmia     HX OF HIGH GRADE HEART BLOCK - REQUIRED PACEMAKER INSERTION  . Shortness of breath     AT TIMES - BUT MUCH IMPROVED AFTER PACEMAKER WAS REPROGRAMED.  . Arthritis     PAIN AND OA LEFT HIP  . CHF (congestive heart failure) ([place])   . Gastritis     Past Surgical History  Procedure Laterality Date  . Mastectomy modified radical      right; with immediate reconstruction  . Laparoscopy  1973  . Abdominal hysterectomy  1974  . Cystoscopy  1975, 2007  .  Myringoplasty  1962  . Tympanoplasty  1973    right  . Oophorectomy  1982  . Bladder suspension    . Rectocele repair      with cystocele repair  . Total hip arthroplasty Right   . Ventral hernia repair  05/30/2011    Procedure: HERNIA REPAIR VENTRAL ADULT;  Surgeon: Haywood Lasso, MD;  Location: [facility];  Service: General;  Laterality: Right;  repair right spigelian hernia  . Breast biopsy  2002    NO BLOOD PRESSURES ON RIGHT SIDE/   s/p  axillary node dissection  . Tonsillectomy    . Back surgery      cervical fusion 4-5 with plate  . Cysto with hydrodistension  09/07/2011    Procedure: CYSTOSCOPY/HYDRODISTENSION;  Surgeon: Ailene Rud, MD;  Location: [facility];  Service: Urology;  Laterality: N/A;  INSTILLATION OF MARCAINE/PYRIDIUM INSTILLATION OF  MARCAINE/KENALOG  . Pacemaker insertion    . Eye surgery      LASIK EYE SURGERY BILATERAL  . Ulnar nerve transposition Right   . Total hip arthroplasty Left 06/12/2013    Procedure: LEFT TOTAL HIP ARTHROPLASTY ANTERIOR APPROACH;  Surgeon: Mcarthur Rossetti, MD;  Location: [facility];  Service: Orthopedics;  Laterality: Left;  . Permanent pacemaker insertion N/A 12/29/2012    Procedure: PERMANENT PACEMAKER INSERTION;  Surgeon: Deboraha Sprang, MD;  Location: [facility] CATH LAB;  Service: Cardiovascular;  Laterality: N/A;  . Left heart catheterization with coronary angiogram N/A 12/29/2012    Procedure: LEFT HEART CATHETERIZATION WITH CORONARY ANGIOGRAM;  Surgeon: Peter M Martinique, MD;  Location: [facility] CATH LAB;  Service: Cardiovascular;  Laterality: N/A;  . Lead revision N/A 12/30/2012    Procedure: LEAD REVISION;  Surgeon: Evans Lance, MD;  Location: [facility] CATH LAB;  Service: Cardiovascular;  Laterality: N/A;  . Neck surgery      c4-5 ruptured disc    There were no vitals filed for this visit.  Visit Diagnosis:  Abnormality of gait      Subjective Assessment - 03/02/15 1023    Subjective The patient reports that she was fatigued after last session, but feels like she is moving better.  She is unsteady when rising due to postural hypotension.     Patient Stated Goals "get my balance" and "be able to improve my ability to talk without stuttering"   Currently in Pain? No/denies             [facility] Adult PT Treatment/Exercise - 03/02/15 1400    Ambulation/Gait   Ambulation/Gait Yes   Ambulation/Gait Assistance 6: Modified independent (Device/Increase time)   Ambulation Distance (Feet) 500 Feet  + feet, 200 ft x 4 reps   Assistive device None   Gait Pattern Decreased arm swing - left;Decreased arm swing - right   Ambulation Surface Level   Gait Comments Gait activities emphasizing assisted arm swing with cues on head turns near wall, direction changes.    High Level Balance   High Level Balance  Activities Backward walking;Direction changes   High Level Balance Comments Patient performed direction changes with giat, standing weight shift laterally and ant/post with reaching to encourage trunk rotation, lateral stepping with return to midline with CGA for safety, standing head turns to improve ROM and decrease muscle guarding.           PT Short Term Goals - 02/14/15 1425    PT SHORT TERM GOAL #1   Title The patient will be indep with HEP for  balance and general mobility.   Baseline Target date 03/17/2015   Time 4   Period Weeks   PT SHORT TERM GOAL #2   Title The patient will improve Berg from 35/56 up to > or equal to 40/56 to demo decreasing risk for falls.   Baseline Target date 03/17/2015   Time 4   Period Weeks   PT SHORT TERM GOAL #3   Title The patient will increase gait speed from 1.51 ft/sec to > or equal to 1.8 to demo decreased risk for falls.   Baseline Target date 03/17/2015   Time 4   Period Weeks   PT SHORT TERM GOAL #4   Title The patient will ambulate modified indep on level surfaces x 300 ft for improved household mobility.   Baseline Target date 03/17/2015   Time 4   Period Weeks           PT Long Term Goals - 02/14/15 1426    PT LONG TERM GOAL #1   Title The patient will be indep with progression of HEP for post d/c.   Baseline Target date 04/16/2015   Time 8   Period Weeks   PT LONG TERM GOAL #2   Title The patient will improve Berg from 35/56 to > or equal to 44/56 to demo decreasing risk for falls.   Baseline Target date 04/16/2015   Time 8   Period Weeks   PT LONG TERM GOAL #3   Title The patient will improve gait speed from 1.51 ft/sec to > or equal to 2.2 ft/sec to demo improved functional mobility.   Baseline Target date 04/16/2015   Time 8   Period Weeks   PT LONG TERM GOAL #4   Title The patient will improve activities of balance confidence scale from 5.6% to > or equal to 20% for improved self perception of balance.   Baseline  Target date 04/16/2015   Time 8   Period Weeks               Plan - 03/02/15 1400    Clinical Impression Statement The patient has shown progress since last session with stride length and reciprocal nature of gait.  She continues with guarded neck/head moving en block and absent arm swing.   PT Next Visit Plan Check HEP, progress giat and balance with emphasis on increasing confidence   Consulted and Agree with Plan of Care Patient        Problem List Patient Active Problem List   Diagnosis Date Noted  . Monoallelic mutation of CHEK2 gene 02/02/2015  . Genetic testing 01/31/2015  . Altered mental status 10/25/2014  . Occipital neuralgia of left side   . Stroke (HCC) 10/07/2014  . History of permanent cardiac pacemaker placement 10/07/2014  . Cephalalgia   . Complicated migraine   . HLD (hyperlipidemia)   . Headache 10/06/2014  . Arthritis of left hip 06/12/2013  . Status post THR (total hip replacement) 06/12/2013  . Constipation 05/25/2013  . Orthostatic hypotension 01/27/2013  . crosstalk-atrial lead 01/27/2013  . Pre-syncope 01/02/2013  . Sinus tachycardia (HCC) 01/01/2013  . Second degree heart block 12/27/2012  . Hypothyroidism 12/27/2012  . Spigelian hernia 04/20/2011  . DYSPNEA 05/09/2010  . BREAST CANCER 05/05/2010  . ASTHMA 05/05/2010  . HIATAL HERNIA 05/05/2010  . DEGENERATIVE Disk DISEASE 05/05/2010  . OSTEOPENIA 05/05/2010    WEAVER,CHRISTINA, PT 03/02/2015, 2:03 PM  [facility] [address] [place], [place],   [address] Phone: [phone]   Fax:  [phone]  Name: [name] MRN: [id number] Date of Birth: 1945/07/07

## 2015-03-03 ENCOUNTER — Encounter: Payer: Self-pay | Admitting: Internal Medicine

## 2015-03-07 ENCOUNTER — Ambulatory Visit: Payer: Medicare Other | Admitting: Rehabilitative and Restorative Service Providers"

## 2015-03-07 DIAGNOSIS — R269 Unspecified abnormalities of gait and mobility: Secondary | ICD-10-CM | POA: Diagnosis not present

## 2015-03-07 NOTE — Patient Instructions (Signed)
Gaze Stabilization: Tip Card 1.Target must remain in focus, not blurry, and appear stationary while head is in motion. 2.Perform exercises with small head movements (45 to either side of midline). 3.Increase speed of head motion so long as target is in focus. 4.If you wear eyeglasses, be sure you can see target through lens (therapist will give specific instructions for bifocal / progressive lenses). 5.These exercises may provoke dizziness or nausea. Work through these symptoms. If too dizzy, slow head movement slightly. Rest between each exercise. 6.Exercises demand concentration; avoid distractions. 7.For safety, perform standing exercises close to a counter, wall, corner, or next to someone.  Copyright  VHI. All rights reserved.  Gaze Stabilization: Standing Feet Apart   Feet shoulder width apart, keeping eyes on target on wall 3 feet away, tilt head down slightly and move head side to side for 30 seconds. Do 2 sessions per day. HAVE A CHAIR IN FRONT OF YOU TO HOLD FOR SUPPORT.  Copyright  VHI. All rights reserved.

## 2015-03-07 NOTE — Therapy (Signed)
[facility] [address] [place], [place], [address] Phone: [phone]   Fax:  [phone]  Physical Therapy Treatment  Patient Details  Name: [name] MRN: [id number] Date of Birth: 08-18-1945 No Data Recorded  Encounter Date: 03/07/2015      PT End of Session - 03/07/15 1159    Visit Number 4   Number of Visits 16   Date for PT Re-Evaluation 04/16/15   Authorization Type G code every 10th visit   PT Start Time 1152   PT Stop Time 1232   PT Time Calculation (min) 40 min   Equipment Utilized During Treatment Gait belt   Activity Tolerance Patient tolerated treatment well   Behavior During Therapy Anxious      Past Medical History  Diagnosis Date  . Diverticulosis   . GERD (gastroesophageal reflux disease)   . Esophageal stricture   . Hemorrhoids   . Mallory - Weiss tear     HEALED   . Interstitial cystitis   . ADHD (attention deficit hyperactivity disorder)   . Breast cancer ([place])     right side  . Blood transfusion   . Thyroid disease   . Allergy     trees/pollen, mold, fungus, dust mites. Takes allergy shots  . Latex allergy, contact dermatitis   . Cataract   . Depression   . Hernia of abdominal wall     spigelian hernia RLQ - SURGERY TO REPAIR  . H/O hiatal hernia   . Neuromuscular disorder (HCC)     central nervous system neuropathy- seen per Dr Erling Cruz  . Asthma     allergist Dr Olena Heckle- monthly allergy injections  . Complication of anesthesia     reaction to some anesthetics/ 7/12 anesth record on chart- states prefers epidural  . PONV (postoperative nausea and vomiting)     pt needs scop patch  . Recurrent upper respiratory infection (URI) 1/13- to present    bronchitis following surgery- states improved but still with cough. OV with [facility] Dr [name] 09/06/11 on chart  . Symptomatic bradycardia     a. 12/2012 s/p MDT dual chamber PPM, ser # [id number]; b. 12/2012 post-op course  complicated by pericardial effusinon req lead revision.  . Pericardial effusion     a. 12/2012 following ppm placement;  b. 01/01/2013 Echo: EF 55-60%, small pericardial effusion w/o RV collapse-->No need for tap/window.  . Chest pain     a. 12/2012 Cath: nl cors, EF 55-65%.  . Autonomic dysfunction     CENTRAL NERVOUS SYSTEM NEUROPATHY - DX BY DR. Erling Cruz MORE THAN 10 YRS AGO - AND IT IS FELT TO CONTRIBUTE TO THE AUTOMIC  DYSFUNCTION-- PT HAS NUMBNESS LEGS AND FEET AND SOMETIIMES TIPS OF FINGER, SEVERE CONSTIPATION( NO SENSATION TO HAVE BM ), DOUBLE VISION, ORTHOSTATIC HYPOTENSION  . Hypothyroidism   . Bruises easily   . Pacemaker   . Dysrhythmia     HX OF HIGH GRADE HEART BLOCK - REQUIRED PACEMAKER INSERTION  . Shortness of breath     AT TIMES - BUT MUCH IMPROVED AFTER PACEMAKER WAS REPROGRAMED.  . Arthritis     PAIN AND OA LEFT HIP  . CHF (congestive heart failure) ([place])   . Gastritis     Past Surgical History  Procedure Laterality Date  . Mastectomy modified radical      right; with immediate reconstruction  . Laparoscopy  1973  . Abdominal hysterectomy  1974  . Cystoscopy  1975, 2007  .  Myringoplasty  1962  . Tympanoplasty  1973    right  . Oophorectomy  1982  . Bladder suspension    . Rectocele repair      with cystocele repair  . Total hip arthroplasty Right   . Ventral hernia repair  05/30/2011    Procedure: HERNIA REPAIR VENTRAL ADULT;  Surgeon: Haywood Lasso, MD;  Location: [facility];  Service: General;  Laterality: Right;  repair right spigelian hernia  . Breast biopsy  2002    NO BLOOD PRESSURES ON RIGHT SIDE/   s/p  axillary node dissection  . Tonsillectomy    . Back surgery      cervical fusion 4-5 with plate  . Cysto with hydrodistension  09/07/2011    Procedure: CYSTOSCOPY/HYDRODISTENSION;  Surgeon: Ailene Rud, MD;  Location: [facility];  Service: Urology;  Laterality: N/A;  INSTILLATION OF MARCAINE/PYRIDIUM INSTILLATION OF  MARCAINE/KENALOG  . Pacemaker insertion    . Eye surgery      LASIK EYE SURGERY BILATERAL  . Ulnar nerve transposition Right   . Total hip arthroplasty Left 06/12/2013    Procedure: LEFT TOTAL HIP ARTHROPLASTY ANTERIOR APPROACH;  Surgeon: Mcarthur Rossetti, MD;  Location: [facility];  Service: Orthopedics;  Laterality: Left;  . Permanent pacemaker insertion N/A 12/29/2012    Procedure: PERMANENT PACEMAKER INSERTION;  Surgeon: Deboraha Sprang, MD;  Location: [facility] CATH LAB;  Service: Cardiovascular;  Laterality: N/A;  . Left heart catheterization with coronary angiogram N/A 12/29/2012    Procedure: LEFT HEART CATHETERIZATION WITH CORONARY ANGIOGRAM;  Surgeon: Peter M Martinique, MD;  Location: [facility] CATH LAB;  Service: Cardiovascular;  Laterality: N/A;  . Lead revision N/A 12/30/2012    Procedure: LEAD REVISION;  Surgeon: Evans Lance, MD;  Location: [facility] CATH LAB;  Service: Cardiovascular;  Laterality: N/A;  . Neck surgery      c4-5 ruptured disc    There were no vitals filed for this visit.  Visit Diagnosis:  Abnormality of gait      Subjective Assessment - 03/07/15 1156    Subjective The patient reports that she is gaining greater endurance by cleaning out home in preparation for estate sale and moving.  She feels that her balance is about the same with imbalance during turns.   Patient Stated Goals "get my balance" and "be able to improve my ability to talk without stuttering"   Currently in Pain? No/denies             [facility] Adult PT Treatment/Exercise - 03/07/15 1328    Ambulation/Gait   Ambulation/Gait Yes   Ambulation/Gait Assistance 7: Independent   Ambulation Distance (Feet) 345 Feet   Assistive device None   Gait Pattern --  with noted instability during turns and en bloc position   Ambulation Surface Level   Gait velocity 2.51 ft/sec   Gait Comments Emphasizing more "normal" gait pattern decreasing neck tightness and guarding.   Neuro Re-ed    Neuro Re-ed Details  Standing  reaching across midline with weight shifting with CGA x 5 reps to each side, standing gaze x 1 viewing x 10 reps in horizontal plane with cues to keep eyes fixated on target, 180 degree turns with compensatory cues for spotting objects and added to 180 degree turns with short distance gait emphasizing spotting/visual cues to decrease instability.     Exercises   Exercises Neck   Neck Exercises: Supine   Neck Retraction 10 reps  in supine with tactile cues  Neck Retraction Limitations pain noted in suboccipitals   Cervical Rotation 5 reps  supine   Other Supine Exercise passive overpressure during neck ROM into sidebending with rotation within patient's level of tolerance.                PT Education - 03/07/15 1232    Education provided Yes   Education Details HEP: gaze x 1 viewing in standing   Person(s) Educated Patient   Methods Explanation;Demonstration;Handout   Comprehension Verbalized understanding;Returned demonstration          PT Short Term Goals - 03/07/15 1210    PT SHORT TERM GOAL #1   Title The patient will be indep with HEP for balance and general mobility.   Baseline Target date 03/17/2015   Time 4   Period Weeks   PT SHORT TERM GOAL #2   Title The patient will improve Berg from 35/56 up to > or equal to 40/56 to demo decreasing risk for falls.   Baseline Target date 03/17/2015   Time 4   Period Weeks   PT SHORT TERM GOAL #3   Title The patient will increase gait speed from 1.51 ft/sec to > or equal to 1.8 to demo decreased risk for falls.   Baseline Imrpoved to 2.51 ft/sec on 03/07/2015   Time 4   Period Weeks   Status Achieved   PT SHORT TERM GOAL #4   Title The patient will ambulate modified indep on level surfaces x 300 ft for improved household mobility.   Baseline Met on 03/07/2015   Time 4   Period Weeks   Status Achieved           PT Long Term Goals - 02/14/15 1426    PT LONG TERM GOAL #1   Title The patient will be indep with  progression of HEP for post d/c.   Baseline Target date 04/16/2015   Time 8   Period Weeks   PT LONG TERM GOAL #2   Title The patient will improve Berg from 35/56 to > or equal to 44/56 to demo decreasing risk for falls.   Baseline Target date 04/16/2015   Time 8   Period Weeks   PT LONG TERM GOAL #3   Title The patient will improve gait speed from 1.51 ft/sec to > or equal to 2.2 ft/sec to demo improved functional mobility.   Baseline Target date 04/16/2015   Time 8   Period Weeks   PT LONG TERM GOAL #4   Title The patient will improve activities of balance confidence scale from 5.6% to > or equal to 20% for improved self perception of balance.   Baseline Target date 04/16/2015   Time 8   Period Weeks               Plan - 03/07/15 1326    Clinical Impression Statement The patient met 2 STGs.  She is limited in turns due to dizziness and has neck guarding.  PT encouraging neck ROM, adding gaze adaptation to HEP.   PT Next Visit Plan Check HEP, progress giat and balance with emphasis on increasing confidence   Consulted and Agree with Plan of Care Patient        Problem List Patient Active Problem List   Diagnosis Date Noted  . Monoallelic mutation of CHEK2 gene 02/02/2015  . Genetic testing 01/31/2015  . Altered mental status 10/25/2014  . Occipital neuralgia of left side   . Stroke ([place]) 10/07/2014  .  History of permanent cardiac pacemaker placement 10/07/2014  . Cephalalgia   . Complicated migraine   . HLD (hyperlipidemia)   . Headache 10/06/2014  . Arthritis of left hip 06/12/2013  . Status post THR (total hip replacement) 06/12/2013  . Constipation 05/25/2013  . Orthostatic hypotension 01/27/2013  . crosstalk-atrial lead 01/27/2013  . Pre-syncope 01/02/2013  . Sinus tachycardia ([place]) 01/01/2013  . Second degree heart block 12/27/2012  . Hypothyroidism 12/27/2012  . Spigelian hernia 04/20/2011  . DYSPNEA 05/09/2010  . BREAST CANCER 05/05/2010  . ASTHMA  05/05/2010  . HIATAL HERNIA 05/05/2010  . DEGENERATIVE Disk DISEASE 05/05/2010  . OSTEOPENIA 05/05/2010    Ismaeel Arvelo, PT 03/07/2015, 1:33 PM  [facility] [address] [place], [place], [address] Phone: [phone]   Fax:  [phone]  Name: MADELYN TLATELPA MRN: [id number] Date of Birth: November 05, 1945

## 2015-03-09 ENCOUNTER — Ambulatory Visit: Payer: Medicare Other | Admitting: Rehabilitative and Restorative Service Providers"

## 2015-03-09 DIAGNOSIS — R269 Unspecified abnormalities of gait and mobility: Secondary | ICD-10-CM

## 2015-03-10 ENCOUNTER — Ambulatory Visit (INDEPENDENT_AMBULATORY_CARE_PROVIDER_SITE_OTHER): Payer: Medicare Other | Admitting: Neurology

## 2015-03-10 DIAGNOSIS — J309 Allergic rhinitis, unspecified: Secondary | ICD-10-CM | POA: Diagnosis not present

## 2015-03-10 NOTE — Therapy (Signed)
[facility] [address] [place], [place], [address] Phone: [phone]   Fax:  [phone]  Physical Therapy Treatment  Patient Details  Name: [name] MRN: [id number] Date of Birth: [birth year] No Data Recorded  Encounter Date: 03/09/2015      PT End of Session - 03/09/15 1113    Visit Number 5   Number of Visits 16   Date for PT Re-Evaluation 04/16/15   Authorization Type G code every 10th visit   PT Start Time 1110   PT Stop Time 1150   PT Time Calculation (min) 40 min   Equipment Utilized During Treatment Gait belt   Activity Tolerance Patient tolerated treatment well   Behavior During Therapy [facility] for tasks assessed/performed      Past Medical History  Diagnosis Date  . Diverticulosis   . GERD (gastroesophageal reflux disease)   . Esophageal stricture   . Hemorrhoids   . Mallory - Weiss tear     HEALED   . Interstitial cystitis   . ADHD (attention deficit hyperactivity disorder)   . Breast cancer ([place])     right side  . Blood transfusion   . Thyroid disease   . Allergy     trees/pollen, mold, fungus, dust mites. Takes allergy shots  . Latex allergy, contact dermatitis   . Cataract   . Depression   . Hernia of abdominal wall     spigelian hernia RLQ - SURGERY TO REPAIR  . H/O hiatal hernia   . Neuromuscular disorder (HCC)     central nervous system neuropathy- seen per Dr Erling Cruz  . Asthma     allergist Dr Olena Heckle- monthly allergy injections  . Complication of anesthesia     reaction to some anesthetics/ 7/12 anesth record on chart- states prefers epidural  . PONV (postoperative nausea and vomiting)     pt needs scop patch  . Recurrent upper respiratory infection (URI) 1/13- to present    bronchitis following surgery- states improved but still with cough. OV with Clearance Dr [name] 09/06/11 on chart  . Symptomatic bradycardia     a. 12/2012 s/p MDT dual chamber PPM, ser # [id number];  b. 12/2012 post-op course complicated by pericardial effusinon req lead revision.  . Pericardial effusion     a. 12/2012 following ppm placement;  b. 01/01/2013 Echo: EF 55-60%, small pericardial effusion w/o RV collapse-->No need for tap/window.  . Chest pain     a. 12/2012 Cath: nl cors, EF 55-65%.  . Autonomic dysfunction     CENTRAL NERVOUS SYSTEM NEUROPATHY - DX BY DR. Erling Cruz MORE THAN 10 YRS AGO - AND IT IS FELT TO CONTRIBUTE TO THE AUTOMIC  DYSFUNCTION-- PT HAS NUMBNESS LEGS AND FEET AND SOMETIIMES TIPS OF FINGER, SEVERE CONSTIPATION( NO SENSATION TO HAVE BM ), DOUBLE VISION, ORTHOSTATIC HYPOTENSION  . Hypothyroidism   . Bruises easily   . Pacemaker   . Dysrhythmia     HX OF HIGH GRADE HEART BLOCK - REQUIRED PACEMAKER INSERTION  . Shortness of breath     AT TIMES - BUT MUCH IMPROVED AFTER PACEMAKER WAS REPROGRAMED.  . Arthritis     PAIN AND OA LEFT HIP  . CHF (congestive heart failure) ([place])   . Gastritis     Past Surgical History  Procedure Laterality Date  . Mastectomy modified radical      right; with immediate reconstruction  . Laparoscopy  1973  . Abdominal hysterectomy  1974  . Cystoscopy  1975,  2007  . [place]  . Tympanoplasty  1973    right  . Oophorectomy  1982  . Bladder suspension    . Rectocele repair      with cystocele repair  . Total hip arthroplasty Right   . Ventral hernia repair  05/30/2011    Procedure: HERNIA REPAIR VENTRAL ADULT;  Surgeon: Haywood Lasso, MD;  Location: [facility];  Service: General;  Laterality: Right;  repair right spigelian hernia  . Breast biopsy  2002    NO BLOOD PRESSURES ON RIGHT SIDE/   s/p  axillary node dissection  . Tonsillectomy    . Back surgery      cervical fusion 4-5 with plate  . Cysto with hydrodistension  09/07/2011    Procedure: CYSTOSCOPY/HYDRODISTENSION;  Surgeon: Ailene Rud, MD;  Location: [facility];  Service: Urology;  Laterality: N/A;  INSTILLATION OF  MARCAINE/PYRIDIUM INSTILLATION OF MARCAINE/KENALOG  . Pacemaker insertion    . Eye surgery      LASIK EYE SURGERY BILATERAL  . Ulnar nerve transposition Right   . Total hip arthroplasty Left 06/12/2013    Procedure: LEFT TOTAL HIP ARTHROPLASTY ANTERIOR APPROACH;  Surgeon: Mcarthur Rossetti, MD;  Location: [facility];  Service: Orthopedics;  Laterality: Left;  . Permanent pacemaker insertion N/A 12/29/2012    Procedure: PERMANENT PACEMAKER INSERTION;  Surgeon: Deboraha Sprang, MD;  Location: [facility] CATH LAB;  Service: Cardiovascular;  Laterality: N/A;  . Left heart catheterization with coronary angiogram N/A 12/29/2012    Procedure: LEFT HEART CATHETERIZATION WITH CORONARY ANGIOGRAM;  Surgeon: Peter M Martinique, MD;  Location: [facility] CATH LAB;  Service: Cardiovascular;  Laterality: N/A;  . Lead revision N/A 12/30/2012    Procedure: LEAD REVISION;  Surgeon: Evans Lance, MD;  Location: [facility] CATH LAB;  Service: Cardiovascular;  Laterality: N/A;  . Neck surgery      c4-5 ruptured disc    There were no vitals filed for this visit.  Visit Diagnosis:  Abnormality of gait      Subjective Assessment - 03/09/15 1111    Subjective "I find a do better without my glasses".  Patient reports she has improved balance whennot wearing glasses.   Patient Stated Goals "get my balance" and "be able to improve my ability to talk without stuttering"   Currently in Pain? No/denies            [facility] PT Assessment - 03/09/15 1131    Standardized Balance Assessment   Standardized Balance Assessment Berg Balance Test   Berg Balance Test   Sit to Stand Able to stand without using hands and stabilize independently   Standing Unsupported Able to stand safely 2 minutes   Sitting with Back Unsupported but Feet Supported on Floor or Stool Able to sit safely and securely 2 minutes   Stand to Sit Sits safely with minimal use of hands   Transfers Able to transfer safely, minor use of hands   Standing Unsupported with Eyes  Closed Able to stand 10 seconds safely   Standing Ubsupported with Feet Together Able to place feet together independently and stand 1 minute safely   From Standing, Reach Forward with Outstretched Arm Can reach confidently >25 cm (10")   From Standing Position, Pick up Object from Floor Able to pick up shoe safely and easily   From Standing Position, Turn to Look Behind Over each Shoulder Looks behind from both sides and weight shifts well   Turn 360 Degrees Needs close  supervision or verbal cueing   Standing Unsupported, Alternately Place Feet on Step/Stool Able to stand independently and safely and complete 8 steps in 20 seconds   Standing Unsupported, One Foot in [place] to plae foot ahead of the other independently and hold 30 seconds   Standing on One Leg Tries to lift leg/unable to hold 3 seconds but remains standing independently   Total Score 49   Berg comment: 49/56 improved from [facility] Adult PT Treatment/Exercise - 03/09/15 1131    Ambulation/Gait   Ambulation/Gait Yes   Ambulation/Gait Assistance 7: Independent   Ambulation Distance (Feet) 500 Feet   Assistive device None   Gait Pattern --  moves en bloc   Ambulation Surface Level   Gait Comments Emphasizing dynamic gait activities with scanning, ball toss during gait, rotational turns with gait and direction changes.  Encouraged arm swing and longer stride with alternating fast/slow pace.   Neuro Re-ed    Neuro Re-ed Details  Standing trunk rotation with reaching across midline encouraging head turns with lateral weight shifting.     Exercises   Exercises Other Exercises   Other Exercises  Seated head turns and levator scapula stretching within tolerable ROM.  See printout for HEP updates for neck A/ROM.         Vestibular Treatment/Exercise - 03/09/15 1130    Vestibular Treatment/Exercise   Gaze Exercises X1 Viewing Horizontal   X1 Viewing Horizontal   Foot Position standing feet apart with SBA    Time --  10-15 reps   Comments patient gets double vision with turns beyond approximately 30 degrees to the left side.  Recommended she perform in small ROM at home emphasizing progression of speed.               PT Education - 03/10/15 1106    Education provided Yes   Education Details recommended paitent perform seated A/ROM rotation and seated levator stretches.  Will add written instruction if patient tolerates well in home.   Person(s) Educated Patient   Methods Explanation;Demonstration;Handout   Comprehension Verbalized understanding;Returned demonstration          PT Short Term Goals - 03/09/15 1130    PT SHORT TERM GOAL #1   Title The patient will be indep with HEP for balance and general mobility.   Baseline Target date 03/17/2015   Time 4   Period Weeks   Status Achieved   PT SHORT TERM GOAL #2   Title The patient will improve Berg from 35/56 up to > or equal to 40/56 to demo decreasing risk for falls.   Baseline Improved t o49/56 on 03/09/2015   Time 4   Period Weeks   Status Achieved   PT SHORT TERM GOAL #3   Title The patient will increase gait speed from 1.51 ft/sec to > or equal to 1.8 to demo decreased risk for falls.   Baseline Imrpoved to 2.51 ft/sec on 03/07/2015   Time 4   Period Weeks   Status Achieved   PT SHORT TERM GOAL #4   Title The patient will ambulate modified indep on level surfaces x 300 ft for improved household mobility.   Baseline Met on 03/07/2015   Time 4   Period Weeks   Status Achieved           PT Long Term Goals - 02/14/15 1426    PT LONG TERM GOAL #1   Title  The patient will be indep with progression of HEP for post d/c.   Baseline Target date 04/16/2015   Time 8   Period Weeks   PT LONG TERM GOAL #2   Title The patient will improve Berg from 35/56 to > or equal to 44/56 to demo decreasing risk for falls.   Baseline Target date 04/16/2015   Time 8   Period Weeks   PT LONG TERM GOAL #3   Title The patient will  improve gait speed from 1.51 ft/sec to > or equal to 2.2 ft/sec to demo improved functional mobility.   Baseline Target date 04/16/2015   Time 8   Period Weeks   PT LONG TERM GOAL #4   Title The patient will improve activities of balance confidence scale from 5.6% to > or equal to 20% for improved self perception of balance.   Baseline Target date 04/16/2015   Time 8   Period Weeks               Plan - 03/10/15 1104    Clinical Impression Statement The patient has met STGs.  She continues with en bloc movement during gait activities.  PT addressing through neck HEP and continued advancement of high level balance and dynamic gait tasks.   PT Next Visit Plan Check HEP, progress giat and balance with emphasis on increasing confidence   Consulted and Agree with Plan of Care Patient        Problem List Patient Active Problem List   Diagnosis Date Noted  . Monoallelic mutation of CHEK2 gene 02/02/2015  . Genetic testing 01/31/2015  . Altered mental status 10/25/2014  . Occipital neuralgia of left side   . Stroke ([place]) 10/07/2014  . History of permanent cardiac pacemaker placement 10/07/2014  . Cephalalgia   . Complicated migraine   . HLD (hyperlipidemia)   . Headache 10/06/2014  . Arthritis of left hip 06/12/2013  . Status post THR (total hip replacement) 06/12/2013  . Constipation 05/25/2013  . Orthostatic hypotension 01/27/2013  . crosstalk-atrial lead 01/27/2013  . Pre-syncope 01/02/2013  . Sinus tachycardia ([place]) 01/01/2013  . Second degree heart block 12/27/2012  . Hypothyroidism 12/27/2012  . Spigelian hernia 04/20/2011  . DYSPNEA 05/09/2010  . BREAST CANCER 05/05/2010  . ASTHMA 05/05/2010  . HIATAL HERNIA 05/05/2010  . DEGENERATIVE Disk DISEASE 05/05/2010  . OSTEOPENIA 05/05/2010    Talley Casco, PT 03/10/2015, 11:06 AM  [facility] [address] [place], [place], [address] Phone:  [phone]   Fax:  [phone]  Name: NACHELLE NEGRETTE MRN: [id number] Date of Birth: [birth year]

## 2015-03-14 ENCOUNTER — Ambulatory Visit: Payer: Medicare Other | Admitting: Rehabilitative and Restorative Service Providers"

## 2015-03-14 ENCOUNTER — Encounter: Payer: Self-pay | Admitting: Rehabilitative and Restorative Service Providers"

## 2015-03-14 DIAGNOSIS — R269 Unspecified abnormalities of gait and mobility: Secondary | ICD-10-CM | POA: Diagnosis not present

## 2015-03-14 NOTE — Therapy (Signed)
[facility] [address] [place], [place], [address] Phone: [phone]   Fax:  [phone]  Physical Therapy Treatment  Patient Details  Name: [name] MRN: [id number] Date of Birth: 11-Oct-1945 No Data Recorded  Encounter Date: 03/14/2015      PT End of Session - 03/14/15 1141    Visit Number 6   Number of Visits 16   Date for PT Re-Evaluation 04/16/15   Authorization Type G code every 10th visit   PT Start Time 1110   PT Stop Time 1144   PT Time Calculation (min) 34 min   Equipment Utilized During Treatment Gait belt   Activity Tolerance Patient tolerated treatment well   Behavior During Therapy [facility] for tasks assessed/performed      Past Medical History  Diagnosis Date  . Diverticulosis   . GERD (gastroesophageal reflux disease)   . Esophageal stricture   . Hemorrhoids   . Mallory - Weiss tear     HEALED   . Interstitial cystitis   . ADHD (attention deficit hyperactivity disorder)   . Breast cancer ([place])     right side  . Blood transfusion   . Thyroid disease   . Allergy     trees/pollen, mold, fungus, dust mites. Takes allergy shots  . Latex allergy, contact dermatitis   . Cataract   . Depression   . Hernia of abdominal wall     spigelian hernia RLQ - SURGERY TO REPAIR  . H/O hiatal hernia   . Neuromuscular disorder (HCC)     central nervous system neuropathy- seen per Dr Erling Cruz  . Asthma     allergist Dr Olena Heckle- monthly allergy injections  . Complication of anesthesia     reaction to some anesthetics/ 7/12 anesth record on chart- states prefers epidural  . PONV (postoperative nausea and vomiting)     pt needs scop patch  . Recurrent upper respiratory infection (URI) 1/13- to present    bronchitis following surgery- states improved but still with cough. OV with [facility] Dr [name] 09/06/11 on chart  . Symptomatic bradycardia     a. 12/2012 s/p MDT dual chamber PPM, ser # [id number];  b. 12/2012 post-op course complicated by pericardial effusinon req lead revision.  . Pericardial effusion     a. 12/2012 following ppm placement;  b. 01/01/2013 Echo: EF 55-60%, small pericardial effusion w/o RV collapse-->No need for tap/window.  . Chest pain     a. 12/2012 Cath: nl cors, EF 55-65%.  . Autonomic dysfunction     CENTRAL NERVOUS SYSTEM NEUROPATHY - DX BY DR. Erling Cruz MORE THAN 10 YRS AGO - AND IT IS FELT TO CONTRIBUTE TO THE AUTOMIC  DYSFUNCTION-- PT HAS NUMBNESS LEGS AND FEET AND SOMETIIMES TIPS OF FINGER, SEVERE CONSTIPATION( NO SENSATION TO HAVE BM ), DOUBLE VISION, ORTHOSTATIC HYPOTENSION  . Hypothyroidism   . Bruises easily   . Pacemaker   . Dysrhythmia     HX OF HIGH GRADE HEART BLOCK - REQUIRED PACEMAKER INSERTION  . Shortness of breath     AT TIMES - BUT MUCH IMPROVED AFTER PACEMAKER WAS REPROGRAMED.  . Arthritis     PAIN AND OA LEFT HIP  . CHF (congestive heart failure) ([place])   . Gastritis     Past Surgical History  Procedure Laterality Date  . Mastectomy modified radical      right; with immediate reconstruction  . Laparoscopy  1973  . Abdominal hysterectomy  1974  . Cystoscopy  1975,  2007  . [place]  . Tympanoplasty  1973    right  . Oophorectomy  1982  . Bladder suspension    . Rectocele repair      with cystocele repair  . Total hip arthroplasty Right   . Ventral hernia repair  05/30/2011    Procedure: HERNIA REPAIR VENTRAL ADULT;  Surgeon: Haywood Lasso, MD;  Location: [facility];  Service: General;  Laterality: Right;  repair right spigelian hernia  . Breast biopsy  2002    NO BLOOD PRESSURES ON RIGHT SIDE/   s/p  axillary node dissection  . Tonsillectomy    . Back surgery      cervical fusion 4-5 with plate  . Cysto with hydrodistension  09/07/2011    Procedure: CYSTOSCOPY/HYDRODISTENSION;  Surgeon: Ailene Rud, MD;  [facility];  Service: Urology;  Laterality: N/A;  INSTILLATION OF  MARCAINE/PYRIDIUM INSTILLATION OF MARCAINE/KENALOG  . Pacemaker insertion    . Eye surgery      LASIK EYE SURGERY BILATERAL  . Ulnar nerve transposition Right   . Total hip arthroplasty Left 06/12/2013    Procedure: LEFT TOTAL HIP ARTHROPLASTY ANTERIOR APPROACH;  Surgeon: Mcarthur Rossetti, MD;  Location: [facility];  Service: Orthopedics;  Laterality: Left;  . Permanent pacemaker insertion N/A 12/29/2012    Procedure: PERMANENT PACEMAKER INSERTION;  Surgeon: Deboraha Sprang, MD;  Location: [facility] CATH LAB;  Service: Cardiovascular;  Laterality: N/A;  . Left heart catheterization with coronary angiogram N/A 12/29/2012    Procedure: LEFT HEART CATHETERIZATION WITH CORONARY ANGIOGRAM;  Surgeon: Peter M Martinique, MD;  Location: [facility] C[facility] LAB;  Service: Cardiovascular;  Laterality: N/A;  . Lead revision N/A 12/30/2012    Procedure: LEAD REVISION;  Surgeon: Evans Lance, MD;  Location: [facility] CATH LAB;  Service: Cardiovascular;  Laterality: N/A;  . Neck surgery      c4-5 ruptured disc    There were no vitals filed for this visit.  Visit Diagnosis:  Abnormality of gait      Subjective Assessment - 03/14/15 1110    Subjective The patient arrived reporting nausea due to taking meds on an empty stomach due to busy morning.  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   "The good new is that I am positively impacted by the activity."    The patient feels her most limiting thing is her eyes.  She feels that her depth perception is off, which impact her balance.  (PT offered saltine cracker packs x 2)   Patient Stated Goals "get my balance" and "be able to improve my ability to talk without stuttering"   Currently in Pain? No/denies          [facility] Adult PT Treatment/Exercise - 03/14/15 1143    Ambulation/Gait   Ambulation/Gait Yes   Ambulation/Gait Assistance 7: Independent   Ambulation Distance (Feet) 500 Feet   Assistive device None   Gait Pattern Within Functional Limits    Ambulation Surface Level   Gait velocity 3.28 ft/sec   Gait Comments Patient with improved confidence      NEUROMUSCULAR RE-EDUCATION: Reviewed all HEP and recommended progression of balance activities for post d/c from PT. Gaze x 1 viewing with cues on how to progress  SELF CARE/HOME MANAGEMENT: Discussed d/c from PT and continued work on balance HEP, walking and general mobility.  PT and patient discussed her confidence with balance.       PT Education - 03/14/15 1129  Education provided Yes   Education Details HEP: progressed HEP to pillow with feet together+eyes closed, feet together on pillow with head turns, VOR x 1, neck stretch.   Person(s) Educated Patient   Methods Explanation;Handout;Demonstration   Comprehension Verbalized understanding;Returned demonstration          PT Short Term Goals - 03/09/15 1130    PT SHORT TERM GOAL #1   Title The patient will be indep with HEP for balance and general mobility.   Baseline Target date 03/17/2015   Time 4   Period Weeks   Status Achieved   PT SHORT TERM GOAL #2   Title The patient will improve Berg from 35/56 up to > or equal to 40/56 to demo decreasing risk for falls.   Baseline Improved t o49/56 on 03/09/2015   Time 4   Period Weeks   Status Achieved   PT SHORT TERM GOAL #3   Title The patient will increase gait speed from 1.51 ft/sec to > or equal to 1.8 to demo decreased risk for falls.   Baseline Imrpoved to 2.51 ft/sec on 03/07/2015   Time 4   Period Weeks   Status Achieved   PT SHORT TERM GOAL #4   Title The patient will ambulate modified indep on level surfaces x 300 ft for improved household mobility.   Baseline Met on 03/07/2015   Time 4   Period Weeks   Status Achieved           PT Long Term Goals - 03/14/15 1130    PT LONG TERM GOAL #1   Title The patient will be indep with progression of HEP for post d/c.   Baseline Target date 04/16/2015   Time 8   Period Weeks   Status Achieved   PT  LONG TERM GOAL #2   Title The patient will improve Berg from 35/56 to > or equal to 44/56 to demo decreasing risk for falls.   Baseline scored 49/56 at retest.   Time 8   Period Weeks   Status Achieved   PT LONG TERM GOAL #3   Title The patient will improve gait speed from 1.51 ft/sec to > or equal to 2.2 ft/sec to demo improved functional mobility.   Baseline Pt met on 03/14/2015 scoring 3.28 ft/sec.   Time 8   Period Weeks   Status Achieved   PT LONG TERM GOAL #4   Title The patient will improve activities of balance confidence scale from 5.6% to > or equal to 20% for improved self perception of balance.   Baseline Improved to 73.1% from 5.6%   Time 8   Period Weeks   Status Achieved               Plan - 03/14/15 1142    Clinical Impression Statement The patient has met LTGs.  She still feels some limitation in her mobility, which appears to be connected to changes in depth perception per subjective report.  PT recommended patient f/u with eye doctor re: deficits in vision as she reports she is planning for further procedures related to cateracts in R eye.   PT Next Visit Plan discharge today.  Patient may want to return after eye procedure if balance still limited.   Consulted and Agree with Plan of Care Patient        Problem List Patient Active Problem List   Diagnosis Date Noted  . Monoallelic mutation of CHEK2 gene 02/02/2015  . Genetic testing 01/31/2015  .  Altered mental status 10/25/2014  . Occipital neuralgia of left side   . Stroke ([place]) 10/07/2014  . History of permanent cardiac pacemaker placement 10/07/2014  . Cephalalgia   . Complicated migraine   . HLD (hyperlipidemia)   . Headache 10/06/2014  . Arthritis of left hip 06/12/2013  . Status post THR (total hip replacement) 06/12/2013  . Constipation 05/25/2013  . Orthostatic hypotension 01/27/2013  . crosstalk-atrial lead 01/27/2013  . Pre-syncope 01/02/2013  . Sinus tachycardia ([place]) 01/01/2013   . Second degree heart block 12/27/2012  . Hypothyroidism 12/27/2012  . Spigelian hernia 04/20/2011  . DYSPNEA 05/09/2010  . BREAST CANCER 05/05/2010  . ASTHMA 05/05/2010  . HIATAL HERNIA 05/05/2010  . DEGENERATIVE Disk DISEASE 05/05/2010  . OSTEOPENIA 05/05/2010    Aria Jarrard, PT 03/14/2015, 1:25 PM  [facility] [address] [place], [place], [address] Phone: [phone]   Fax:  [phone]  Name: JOSHLYNN ALFONZO MRN: [id number] Date of Birth: 09-26-1945

## 2015-03-14 NOTE — Patient Instructions (Signed)
Levator Scapula Stretch, Sitting    Sit, turn head toward right side and look down. Use hand on head to gently stretch neck in that position. Hold _20__ seconds.  Repeat on the other side. Repeat _3__ times per session. Do _1-2__ sessions per day.  Copyright  VHI. All rights reserved.  Feet Together (Compliant Surface) Varied Arm Positions - Eyes Closed    Stand on compliant surface: _pillow_______ with feet together and arms at your side. Close eyes and visualize upright position. Hold__30__ seconds. Repeat __2__ times per session. Do _1-2___ sessions per day.  Copyright  VHI. All rights reserved.  Feet Together (Compliant Surface) Head Motion - Eyes Open    With eyes open, standing on compliant surface: __pillow______, feet together, move head slowly: side to side. Repeat __5__ times per session. Do _1-2___ sessions per day.  Copyright  VHI. All rights reserved.  Gaze Stabilization: Tip Card 1.Target must remain in focus, not blurry, and appear stationary while head is in motion. 2.Perform exercises with small head movements (45 to either side of midline). 3.Increase speed of head motion so long as target is in focus. 4.If you wear eyeglasses, be sure you can see target through lens (therapist will give specific instructions for bifocal / progressive lenses). 5.These exercises may provoke dizziness or nausea. Work through these symptoms. If too dizzy, slow head movement slightly. Rest between each exercise. 6.Exercises demand concentration; avoid distractions. 7.For safety, perform standing exercises close to a counter, wall, corner, or next to someone.  Copyright  VHI. All rights reserved.  Gaze Stabilization: Standing Feet Apart   Feet shoulder width apart, keeping eyes on target on wall 3 feet away, tilt head down slightly and move head side to side for 30 seconds. Do 2 sessions per day. HAVE A CHAIR IN FRONT OF YOU TO HOLD FOR SUPPORT.  Copyright  VHI. All rights  reserved.

## 2015-03-14 NOTE — Therapy (Signed)
[facility] [address] [place], [place], [address] Phone: [phone]   Fax:  [phone]  Patient Details  Name: [name] MRN: [id number] Date of Birth: 07/20/1945 Referring Provider:  No ref. provider found  Encounter Date: 03/14/2015  PHYSICAL THERAPY DISCHARGE SUMMARY  Visits from Start of Care: 6  Current functional level related to goals / functional outcomes:     PT Short Term Goals - 03/09/15 1130    PT SHORT TERM GOAL #1   Title The patient will be indep with HEP for balance and general mobility.   Baseline Target date 03/17/2015   Time 4   Period Weeks   Status Achieved   PT SHORT TERM GOAL #2   Title The patient will improve Berg from 35/56 up to > or equal to 40/56 to demo decreasing risk for falls.   Baseline Improved t o49/56 on 03/09/2015   Time 4   Period Weeks   Status Achieved   PT SHORT TERM GOAL #3   Title The patient will increase gait speed from 1.51 ft/sec to > or equal to 1.8 to demo decreased risk for falls.   Baseline Imrpoved to 2.51 ft/sec on 03/07/2015   Time 4   Period Weeks   Status Achieved   PT SHORT TERM GOAL #4   Title The patient will ambulate modified indep on level surfaces x 300 ft for improved household mobility.   Baseline Met on 03/07/2015   Time 4   Period Weeks   Status Achieved         PT Long Term Goals - 03/14/15 1130    PT LONG TERM GOAL #1   Title The patient will be indep with progression of HEP for post d/c.   Baseline Target date 04/16/2015   Time 8   Period Weeks   Status Achieved   PT LONG TERM GOAL #2   Title The patient will improve Berg from 35/56 to > or equal to 44/56 to demo decreasing risk for falls.   Baseline scored 49/56 at retest.   Time 8   Period Weeks   Status Achieved   PT LONG TERM GOAL #3   Title The patient will improve gait speed from 1.51 ft/sec to > or equal to 2.2 ft/sec to demo improved functional mobility.    Baseline Pt met on 03/14/2015 scoring 3.28 ft/sec.   Time 8   Period Weeks   Status Achieved   PT LONG TERM GOAL #4   Title The patient will improve activities of balance confidence scale from 5.6% to > or equal to 20% for improved self perception of balance.   Baseline Improved to 73.1% from 5.6%   Time 8   Period Weeks   Status Achieved        Remaining deficits: Visual changes limiting balance.   Education / Equipment: HEP and home progression.  Plan: Patient agrees to discharge.  Patient goals were met. Patient is being discharged due to meeting the stated rehab goals.  ?????       Thank you for the referral of this patient. Rudell Cobb, MPT   [place] 03/14/2015, 1:39 PM  [facility] [address] [place], [place], [address] Phone: [phone]   Fax:  [phone]

## 2015-03-16 ENCOUNTER — Ambulatory Visit: Payer: Medicare Other | Admitting: Rehabilitative and Restorative Service Providers"

## 2015-03-17 ENCOUNTER — Ambulatory Visit (INDEPENDENT_AMBULATORY_CARE_PROVIDER_SITE_OTHER): Payer: Medicare Other

## 2015-03-17 DIAGNOSIS — J309 Allergic rhinitis, unspecified: Secondary | ICD-10-CM | POA: Diagnosis not present

## 2015-03-18 DIAGNOSIS — R197 Diarrhea, unspecified: Secondary | ICD-10-CM | POA: Diagnosis not present

## 2015-03-18 DIAGNOSIS — Z79899 Other long term (current) drug therapy: Secondary | ICD-10-CM | POA: Diagnosis not present

## 2015-03-18 DIAGNOSIS — Z23 Encounter for immunization: Secondary | ICD-10-CM | POA: Diagnosis not present

## 2015-03-18 DIAGNOSIS — I951 Orthostatic hypotension: Secondary | ICD-10-CM | POA: Diagnosis not present

## 2015-03-22 ENCOUNTER — Encounter: Payer: Medicare Other | Admitting: Rehabilitative and Restorative Service Providers"

## 2015-03-22 DIAGNOSIS — R197 Diarrhea, unspecified: Secondary | ICD-10-CM | POA: Diagnosis not present

## 2015-03-22 DIAGNOSIS — K52831 Collagenous colitis: Secondary | ICD-10-CM | POA: Diagnosis not present

## 2015-03-23 DIAGNOSIS — M25552 Pain in left hip: Secondary | ICD-10-CM | POA: Diagnosis not present

## 2015-03-23 DIAGNOSIS — M25562 Pain in left knee: Secondary | ICD-10-CM | POA: Diagnosis not present

## 2015-03-24 ENCOUNTER — Other Ambulatory Visit: Payer: Self-pay

## 2015-03-24 ENCOUNTER — Ambulatory Visit (INDEPENDENT_AMBULATORY_CARE_PROVIDER_SITE_OTHER): Payer: Medicare Other

## 2015-03-24 ENCOUNTER — Encounter: Payer: Medicare Other | Admitting: Rehabilitative and Restorative Service Providers"

## 2015-03-24 DIAGNOSIS — J309 Allergic rhinitis, unspecified: Secondary | ICD-10-CM

## 2015-03-24 MED ORDER — EPINEPHRINE 0.3 MG/0.3ML IJ SOAJ
0.3000 mg | Freq: Once | INTRAMUSCULAR | Status: DC
Start: 2015-03-24 — End: 2018-03-21

## 2015-03-25 ENCOUNTER — Encounter: Payer: Medicare Other | Admitting: Rehabilitative and Restorative Service Providers"

## 2015-03-28 ENCOUNTER — Encounter: Payer: Medicare Other | Admitting: Rehabilitative and Restorative Service Providers"

## 2015-03-31 DIAGNOSIS — M5481 Occipital neuralgia: Secondary | ICD-10-CM | POA: Diagnosis not present

## 2015-04-01 ENCOUNTER — Encounter: Payer: Medicare Other | Admitting: Rehabilitative and Restorative Service Providers"

## 2015-04-03 ENCOUNTER — Other Ambulatory Visit: Payer: Self-pay | Admitting: Allergy and Immunology

## 2015-04-20 DIAGNOSIS — K529 Noninfective gastroenteritis and colitis, unspecified: Secondary | ICD-10-CM | POA: Diagnosis not present

## 2015-04-21 ENCOUNTER — Ambulatory Visit (INDEPENDENT_AMBULATORY_CARE_PROVIDER_SITE_OTHER): Payer: Medicare Other

## 2015-04-21 DIAGNOSIS — H2513 Age-related nuclear cataract, bilateral: Secondary | ICD-10-CM | POA: Diagnosis not present

## 2015-04-21 DIAGNOSIS — H5111 Convergence insufficiency: Secondary | ICD-10-CM | POA: Diagnosis not present

## 2015-04-21 DIAGNOSIS — J309 Allergic rhinitis, unspecified: Secondary | ICD-10-CM | POA: Diagnosis not present

## 2015-04-21 DIAGNOSIS — Z9889 Other specified postprocedural states: Secondary | ICD-10-CM | POA: Diagnosis not present

## 2015-04-25 DIAGNOSIS — M25552 Pain in left hip: Secondary | ICD-10-CM | POA: Diagnosis not present

## 2015-04-25 DIAGNOSIS — M25562 Pain in left knee: Secondary | ICD-10-CM | POA: Diagnosis not present

## 2015-04-27 ENCOUNTER — Other Ambulatory Visit: Payer: Self-pay | Admitting: Neurology

## 2015-04-27 DIAGNOSIS — G909 Disorder of the autonomic nervous system, unspecified: Secondary | ICD-10-CM | POA: Diagnosis not present

## 2015-04-27 DIAGNOSIS — I480 Paroxysmal atrial fibrillation: Secondary | ICD-10-CM | POA: Diagnosis not present

## 2015-04-27 DIAGNOSIS — R5383 Other fatigue: Secondary | ICD-10-CM | POA: Diagnosis not present

## 2015-04-27 DIAGNOSIS — F329 Major depressive disorder, single episode, unspecified: Secondary | ICD-10-CM | POA: Diagnosis not present

## 2015-04-27 DIAGNOSIS — H538 Other visual disturbances: Secondary | ICD-10-CM | POA: Diagnosis not present

## 2015-04-27 DIAGNOSIS — D692 Other nonthrombocytopenic purpura: Secondary | ICD-10-CM | POA: Diagnosis not present

## 2015-04-27 DIAGNOSIS — R197 Diarrhea, unspecified: Secondary | ICD-10-CM | POA: Diagnosis not present

## 2015-04-27 DIAGNOSIS — C50919 Malignant neoplasm of unspecified site of unspecified female breast: Secondary | ICD-10-CM | POA: Diagnosis not present

## 2015-04-27 DIAGNOSIS — K219 Gastro-esophageal reflux disease without esophagitis: Secondary | ICD-10-CM | POA: Diagnosis not present

## 2015-04-27 DIAGNOSIS — Z6822 Body mass index (BMI) 22.0-22.9, adult: Secondary | ICD-10-CM | POA: Diagnosis not present

## 2015-04-27 MED ORDER — ESOMEPRAZOLE MAGNESIUM 40 MG PO CPDR: 40.0000 mg | DELAYED_RELEASE_CAPSULE | Freq: Every day | ORAL | Status: DC

## 2015-04-28 ENCOUNTER — Other Ambulatory Visit: Payer: Self-pay

## 2015-04-28 MED ORDER — ESOMEPRAZOLE MAGNESIUM 40 MG PO CPDR: 40.0000 mg | DELAYED_RELEASE_CAPSULE | Freq: Every day | ORAL | Status: DC

## 2015-04-29 ENCOUNTER — Other Ambulatory Visit: Payer: Self-pay | Admitting: Neurology

## 2015-04-29 NOTE — Telephone Encounter (Signed)
Denied refill request for Nexium to [facility]. Patient needs OV. Last OV 02/16/14.

## 2015-05-09 ENCOUNTER — Other Ambulatory Visit: Payer: Self-pay | Admitting: Internal Medicine

## 2015-05-12 ENCOUNTER — Ambulatory Visit (INDEPENDENT_AMBULATORY_CARE_PROVIDER_SITE_OTHER): Payer: Medicare Other

## 2015-05-12 DIAGNOSIS — M25562 Pain in left knee: Secondary | ICD-10-CM | POA: Diagnosis not present

## 2015-05-12 DIAGNOSIS — M25552 Pain in left hip: Secondary | ICD-10-CM | POA: Diagnosis not present

## 2015-05-12 DIAGNOSIS — J309 Allergic rhinitis, unspecified: Secondary | ICD-10-CM | POA: Diagnosis not present

## 2015-05-17 DIAGNOSIS — M25552 Pain in left hip: Secondary | ICD-10-CM | POA: Diagnosis not present

## 2015-05-17 DIAGNOSIS — M25562 Pain in left knee: Secondary | ICD-10-CM | POA: Diagnosis not present

## 2015-05-18 DIAGNOSIS — J3089 Other allergic rhinitis: Secondary | ICD-10-CM | POA: Diagnosis not present

## 2015-05-19 DIAGNOSIS — J301 Allergic rhinitis due to pollen: Secondary | ICD-10-CM | POA: Diagnosis not present

## 2015-05-24 ENCOUNTER — Encounter (HOSPITAL_COMMUNITY): Payer: Self-pay

## 2015-05-24 DIAGNOSIS — M25562 Pain in left knee: Secondary | ICD-10-CM | POA: Diagnosis not present

## 2015-05-24 DIAGNOSIS — M25552 Pain in left hip: Secondary | ICD-10-CM | POA: Diagnosis not present

## 2015-05-26 ENCOUNTER — Ambulatory Visit (INDEPENDENT_AMBULATORY_CARE_PROVIDER_SITE_OTHER): Payer: Medicare Other | Admitting: *Deleted

## 2015-05-26 ENCOUNTER — Telehealth: Payer: Self-pay | Admitting: Cardiology

## 2015-05-26 DIAGNOSIS — M25562 Pain in left knee: Secondary | ICD-10-CM | POA: Diagnosis not present

## 2015-05-26 DIAGNOSIS — I442 Atrioventricular block, complete: Secondary | ICD-10-CM | POA: Diagnosis not present

## 2015-05-26 DIAGNOSIS — M25552 Pain in left hip: Secondary | ICD-10-CM | POA: Diagnosis not present

## 2015-05-26 NOTE — Telephone Encounter (Signed)
Spoke with pt and reminded pt of remote transmission that is due today. Pt verbalized understanding.   

## 2015-05-30 NOTE — Progress Notes (Signed)
Remote pacemaker transmission.   

## 2015-05-31 DIAGNOSIS — M25552 Pain in left hip: Secondary | ICD-10-CM | POA: Diagnosis not present

## 2015-05-31 DIAGNOSIS — M25562 Pain in left knee: Secondary | ICD-10-CM | POA: Diagnosis not present

## 2015-06-03 ENCOUNTER — Encounter: Payer: Self-pay | Admitting: Cardiology

## 2015-06-03 LAB — CUP PACEART REMOTE DEVICE CHECK
Brady Statistic AP VP Percent: 21.78 %
Brady Statistic AP VS Percent: 0 %
Brady Statistic AS VP Percent: 78.2 %
Brady Statistic AS VS Percent: 0.02 %
Brady Statistic RA Percent Paced: 21.78 %
Implantable Lead Implant Date: 20140818
Implantable Lead Implant Date: 20140818
Implantable Lead Location: 753859
Implantable Lead Location: 753860
Implantable Lead Model: 5076
Implantable Lead Model: 5076
Lead Channel Impedance Value: 323 Ohm
Lead Channel Impedance Value: 380 Ohm
Lead Channel Impedance Value: 646 Ohm
Lead Channel Pacing Threshold Amplitude: 0.625 V
Lead Channel Pacing Threshold Amplitude: 0.875 V
Lead Channel Pacing Threshold Pulse Width: 0.4 ms
Lead Channel Pacing Threshold Pulse Width: 0.4 ms
Lead Channel Sensing Intrinsic Amplitude: 2.5 mV
Lead Channel Sensing Intrinsic Amplitude: 26.25 mV
Lead Channel Sensing Intrinsic Amplitude: 26.25 mV
Lead Channel Setting Pacing Amplitude: 2 V
Lead Channel Setting Pacing Amplitude: 2.5 V
Lead Channel Setting Pacing Pulse Width: 0.4 ms
Lead Channel Setting Sensing Sensitivity: 0.9 mV
MDC IDC MSMT BATTERY REMAINING LONGEVITY: 88 mo
MDC IDC MSMT BATTERY VOLTAGE: 3.01 V
MDC IDC MSMT LEADCHNL RA SENSING INTR AMPL: 2.5 mV
MDC IDC MSMT LEADCHNL RV IMPEDANCE VALUE: 722 Ohm
MDC IDC SESS DTM: 20170113005322
MDC IDC STAT BRADY RV PERCENT PACED: 99.98 %

## 2015-06-06 DIAGNOSIS — M25552 Pain in left hip: Secondary | ICD-10-CM | POA: Diagnosis not present

## 2015-06-06 DIAGNOSIS — M25562 Pain in left knee: Secondary | ICD-10-CM | POA: Diagnosis not present

## 2015-06-07 DIAGNOSIS — M25552 Pain in left hip: Secondary | ICD-10-CM | POA: Diagnosis not present

## 2015-06-07 DIAGNOSIS — M25562 Pain in left knee: Secondary | ICD-10-CM | POA: Diagnosis not present

## 2015-06-09 DIAGNOSIS — M25552 Pain in left hip: Secondary | ICD-10-CM | POA: Diagnosis not present

## 2015-06-09 DIAGNOSIS — M25562 Pain in left knee: Secondary | ICD-10-CM | POA: Diagnosis not present

## 2015-06-10 ENCOUNTER — Encounter: Payer: Self-pay | Admitting: Cardiology

## 2015-06-13 DIAGNOSIS — M25552 Pain in left hip: Secondary | ICD-10-CM | POA: Diagnosis not present

## 2015-06-13 DIAGNOSIS — M25562 Pain in left knee: Secondary | ICD-10-CM | POA: Diagnosis not present

## 2015-06-20 DIAGNOSIS — M25562 Pain in left knee: Secondary | ICD-10-CM | POA: Diagnosis not present

## 2015-06-20 DIAGNOSIS — M25552 Pain in left hip: Secondary | ICD-10-CM | POA: Diagnosis not present

## 2015-06-22 DIAGNOSIS — M25562 Pain in left knee: Secondary | ICD-10-CM | POA: Diagnosis not present

## 2015-06-22 DIAGNOSIS — M25552 Pain in left hip: Secondary | ICD-10-CM | POA: Diagnosis not present

## 2015-06-27 DIAGNOSIS — F329 Major depressive disorder, single episode, unspecified: Secondary | ICD-10-CM | POA: Diagnosis not present

## 2015-06-27 DIAGNOSIS — J45909 Unspecified asthma, uncomplicated: Secondary | ICD-10-CM | POA: Diagnosis not present

## 2015-06-27 DIAGNOSIS — K589 Irritable bowel syndrome without diarrhea: Secondary | ICD-10-CM | POA: Diagnosis not present

## 2015-06-27 DIAGNOSIS — Z780 Asymptomatic menopausal state: Secondary | ICD-10-CM | POA: Diagnosis not present

## 2015-06-27 DIAGNOSIS — I4891 Unspecified atrial fibrillation: Secondary | ICD-10-CM | POA: Diagnosis not present

## 2015-06-27 DIAGNOSIS — H2511 Age-related nuclear cataract, right eye: Secondary | ICD-10-CM | POA: Diagnosis not present

## 2015-06-27 DIAGNOSIS — K52831 Collagenous colitis: Secondary | ICD-10-CM | POA: Diagnosis not present

## 2015-06-27 DIAGNOSIS — I10 Essential (primary) hypertension: Secondary | ICD-10-CM | POA: Diagnosis not present

## 2015-06-27 DIAGNOSIS — Z9010 Acquired absence of unspecified breast and nipple: Secondary | ICD-10-CM | POA: Diagnosis not present

## 2015-06-27 DIAGNOSIS — E039 Hypothyroidism, unspecified: Secondary | ICD-10-CM | POA: Diagnosis not present

## 2015-06-27 DIAGNOSIS — Z853 Personal history of malignant neoplasm of breast: Secondary | ICD-10-CM | POA: Diagnosis not present

## 2015-06-27 DIAGNOSIS — H52221 Regular astigmatism, right eye: Secondary | ICD-10-CM | POA: Diagnosis not present

## 2015-06-27 DIAGNOSIS — Q615 Medullary cystic kidney: Secondary | ICD-10-CM | POA: Diagnosis not present

## 2015-06-27 DIAGNOSIS — Z950 Presence of cardiac pacemaker: Secondary | ICD-10-CM | POA: Diagnosis not present

## 2015-06-27 DIAGNOSIS — Z90710 Acquired absence of both cervix and uterus: Secondary | ICD-10-CM | POA: Diagnosis not present

## 2015-06-27 DIAGNOSIS — K219 Gastro-esophageal reflux disease without esophagitis: Secondary | ICD-10-CM | POA: Diagnosis not present

## 2015-07-11 DIAGNOSIS — I4891 Unspecified atrial fibrillation: Secondary | ICD-10-CM | POA: Diagnosis not present

## 2015-07-11 DIAGNOSIS — H2512 Age-related nuclear cataract, left eye: Secondary | ICD-10-CM | POA: Diagnosis not present

## 2015-07-11 DIAGNOSIS — F329 Major depressive disorder, single episode, unspecified: Secondary | ICD-10-CM | POA: Diagnosis not present

## 2015-07-11 DIAGNOSIS — Z9841 Cataract extraction status, right eye: Secondary | ICD-10-CM | POA: Diagnosis not present

## 2015-07-11 DIAGNOSIS — H52222 Regular astigmatism, left eye: Secondary | ICD-10-CM | POA: Diagnosis not present

## 2015-07-11 DIAGNOSIS — K219 Gastro-esophageal reflux disease without esophagitis: Secondary | ICD-10-CM | POA: Diagnosis not present

## 2015-07-11 DIAGNOSIS — E039 Hypothyroidism, unspecified: Secondary | ICD-10-CM | POA: Diagnosis not present

## 2015-07-11 DIAGNOSIS — I10 Essential (primary) hypertension: Secondary | ICD-10-CM | POA: Diagnosis not present

## 2015-07-11 DIAGNOSIS — Z9010 Acquired absence of unspecified breast and nipple: Secondary | ICD-10-CM | POA: Diagnosis not present

## 2015-07-11 DIAGNOSIS — Z853 Personal history of malignant neoplasm of breast: Secondary | ICD-10-CM | POA: Diagnosis not present

## 2015-07-11 DIAGNOSIS — Z7982 Long term (current) use of aspirin: Secondary | ICD-10-CM | POA: Diagnosis not present

## 2015-07-11 DIAGNOSIS — Z950 Presence of cardiac pacemaker: Secondary | ICD-10-CM | POA: Diagnosis not present

## 2015-07-11 DIAGNOSIS — K589 Irritable bowel syndrome without diarrhea: Secondary | ICD-10-CM | POA: Diagnosis not present

## 2015-07-11 DIAGNOSIS — Z961 Presence of intraocular lens: Secondary | ICD-10-CM | POA: Diagnosis not present

## 2015-07-29 ENCOUNTER — Ambulatory Visit (INDEPENDENT_AMBULATORY_CARE_PROVIDER_SITE_OTHER): Payer: Medicare Other | Admitting: *Deleted

## 2015-07-29 DIAGNOSIS — J309 Allergic rhinitis, unspecified: Secondary | ICD-10-CM

## 2015-07-29 DIAGNOSIS — C50919 Malignant neoplasm of unspecified site of unspecified female breast: Secondary | ICD-10-CM | POA: Diagnosis not present

## 2015-08-04 ENCOUNTER — Ambulatory Visit (INDEPENDENT_AMBULATORY_CARE_PROVIDER_SITE_OTHER): Payer: Medicare Other

## 2015-08-04 DIAGNOSIS — J309 Allergic rhinitis, unspecified: Secondary | ICD-10-CM | POA: Diagnosis not present

## 2015-08-08 DIAGNOSIS — M25552 Pain in left hip: Secondary | ICD-10-CM | POA: Diagnosis not present

## 2015-08-08 DIAGNOSIS — Z1212 Encounter for screening for malignant neoplasm of rectum: Secondary | ICD-10-CM | POA: Diagnosis not present

## 2015-08-10 DIAGNOSIS — M79605 Pain in left leg: Secondary | ICD-10-CM | POA: Diagnosis not present

## 2015-08-10 DIAGNOSIS — M79604 Pain in right leg: Secondary | ICD-10-CM | POA: Diagnosis not present

## 2015-08-10 DIAGNOSIS — M545 Low back pain: Secondary | ICD-10-CM | POA: Diagnosis not present

## 2015-08-10 DIAGNOSIS — M6281 Muscle weakness (generalized): Secondary | ICD-10-CM | POA: Diagnosis not present

## 2015-08-12 ENCOUNTER — Ambulatory Visit (INDEPENDENT_AMBULATORY_CARE_PROVIDER_SITE_OTHER): Payer: Medicare Other | Admitting: *Deleted

## 2015-08-12 DIAGNOSIS — M79605 Pain in left leg: Secondary | ICD-10-CM | POA: Diagnosis not present

## 2015-08-12 DIAGNOSIS — J301 Allergic rhinitis due to pollen: Secondary | ICD-10-CM | POA: Diagnosis not present

## 2015-08-12 DIAGNOSIS — G909 Disorder of the autonomic nervous system, unspecified: Secondary | ICD-10-CM | POA: Diagnosis not present

## 2015-08-12 DIAGNOSIS — Z6823 Body mass index (BMI) 23.0-23.9, adult: Secondary | ICD-10-CM | POA: Diagnosis not present

## 2015-08-12 DIAGNOSIS — Z1389 Encounter for screening for other disorder: Secondary | ICD-10-CM | POA: Diagnosis not present

## 2015-08-12 DIAGNOSIS — N644 Mastodynia: Secondary | ICD-10-CM | POA: Diagnosis not present

## 2015-08-12 DIAGNOSIS — Z950 Presence of cardiac pacemaker: Secondary | ICD-10-CM | POA: Diagnosis not present

## 2015-08-12 DIAGNOSIS — M79604 Pain in right leg: Secondary | ICD-10-CM | POA: Diagnosis not present

## 2015-08-12 DIAGNOSIS — M545 Low back pain: Secondary | ICD-10-CM | POA: Diagnosis not present

## 2015-08-12 DIAGNOSIS — F329 Major depressive disorder, single episode, unspecified: Secondary | ICD-10-CM | POA: Diagnosis not present

## 2015-08-12 DIAGNOSIS — R197 Diarrhea, unspecified: Secondary | ICD-10-CM | POA: Diagnosis not present

## 2015-08-12 DIAGNOSIS — D692 Other nonthrombocytopenic purpura: Secondary | ICD-10-CM | POA: Diagnosis not present

## 2015-08-12 DIAGNOSIS — J309 Allergic rhinitis, unspecified: Secondary | ICD-10-CM

## 2015-08-12 DIAGNOSIS — J45909 Unspecified asthma, uncomplicated: Secondary | ICD-10-CM | POA: Diagnosis not present

## 2015-08-12 DIAGNOSIS — M6281 Muscle weakness (generalized): Secondary | ICD-10-CM | POA: Diagnosis not present

## 2015-08-12 DIAGNOSIS — I480 Paroxysmal atrial fibrillation: Secondary | ICD-10-CM | POA: Diagnosis not present

## 2015-08-16 DIAGNOSIS — M79605 Pain in left leg: Secondary | ICD-10-CM | POA: Diagnosis not present

## 2015-08-16 DIAGNOSIS — M6281 Muscle weakness (generalized): Secondary | ICD-10-CM | POA: Diagnosis not present

## 2015-08-16 DIAGNOSIS — M79604 Pain in right leg: Secondary | ICD-10-CM | POA: Diagnosis not present

## 2015-08-16 DIAGNOSIS — M545 Low back pain: Secondary | ICD-10-CM | POA: Diagnosis not present

## 2015-08-18 ENCOUNTER — Ambulatory Visit (INDEPENDENT_AMBULATORY_CARE_PROVIDER_SITE_OTHER): Payer: Medicare Other

## 2015-08-18 DIAGNOSIS — J309 Allergic rhinitis, unspecified: Secondary | ICD-10-CM

## 2015-08-18 DIAGNOSIS — M6281 Muscle weakness (generalized): Secondary | ICD-10-CM | POA: Diagnosis not present

## 2015-08-18 DIAGNOSIS — M545 Low back pain: Secondary | ICD-10-CM | POA: Diagnosis not present

## 2015-08-18 DIAGNOSIS — M79604 Pain in right leg: Secondary | ICD-10-CM | POA: Diagnosis not present

## 2015-08-18 DIAGNOSIS — M79605 Pain in left leg: Secondary | ICD-10-CM | POA: Diagnosis not present

## 2015-08-23 DIAGNOSIS — M79604 Pain in right leg: Secondary | ICD-10-CM | POA: Diagnosis not present

## 2015-08-23 DIAGNOSIS — M545 Low back pain: Secondary | ICD-10-CM | POA: Diagnosis not present

## 2015-08-23 DIAGNOSIS — M79605 Pain in left leg: Secondary | ICD-10-CM | POA: Diagnosis not present

## 2015-08-23 DIAGNOSIS — M6281 Muscle weakness (generalized): Secondary | ICD-10-CM | POA: Diagnosis not present

## 2015-08-25 ENCOUNTER — Ambulatory Visit (INDEPENDENT_AMBULATORY_CARE_PROVIDER_SITE_OTHER): Payer: Medicare Other | Admitting: *Deleted

## 2015-08-25 DIAGNOSIS — I442 Atrioventricular block, complete: Secondary | ICD-10-CM

## 2015-08-25 DIAGNOSIS — Z961 Presence of intraocular lens: Secondary | ICD-10-CM | POA: Diagnosis not present

## 2015-08-25 DIAGNOSIS — Z950 Presence of cardiac pacemaker: Secondary | ICD-10-CM

## 2015-08-26 DIAGNOSIS — M79604 Pain in right leg: Secondary | ICD-10-CM | POA: Diagnosis not present

## 2015-08-26 DIAGNOSIS — M79605 Pain in left leg: Secondary | ICD-10-CM | POA: Diagnosis not present

## 2015-08-26 DIAGNOSIS — M6281 Muscle weakness (generalized): Secondary | ICD-10-CM | POA: Diagnosis not present

## 2015-08-26 DIAGNOSIS — M545 Low back pain: Secondary | ICD-10-CM | POA: Diagnosis not present

## 2015-08-29 NOTE — Progress Notes (Signed)
Remote pacemaker transmission.   

## 2015-08-30 DIAGNOSIS — M79605 Pain in left leg: Secondary | ICD-10-CM | POA: Diagnosis not present

## 2015-08-30 DIAGNOSIS — M545 Low back pain: Secondary | ICD-10-CM | POA: Diagnosis not present

## 2015-08-30 DIAGNOSIS — M79604 Pain in right leg: Secondary | ICD-10-CM | POA: Diagnosis not present

## 2015-08-30 DIAGNOSIS — M6281 Muscle weakness (generalized): Secondary | ICD-10-CM | POA: Diagnosis not present

## 2015-09-01 DIAGNOSIS — Z853 Personal history of malignant neoplasm of breast: Secondary | ICD-10-CM | POA: Diagnosis not present

## 2015-09-01 DIAGNOSIS — N644 Mastodynia: Secondary | ICD-10-CM | POA: Diagnosis not present

## 2015-09-01 DIAGNOSIS — M79604 Pain in right leg: Secondary | ICD-10-CM | POA: Diagnosis not present

## 2015-09-01 DIAGNOSIS — Z644 Discord with counselors: Secondary | ICD-10-CM | POA: Diagnosis not present

## 2015-09-01 DIAGNOSIS — M6281 Muscle weakness (generalized): Secondary | ICD-10-CM | POA: Diagnosis not present

## 2015-09-01 DIAGNOSIS — M79605 Pain in left leg: Secondary | ICD-10-CM | POA: Diagnosis not present

## 2015-09-01 DIAGNOSIS — M545 Low back pain: Secondary | ICD-10-CM | POA: Diagnosis not present

## 2015-09-06 DIAGNOSIS — M6281 Muscle weakness (generalized): Secondary | ICD-10-CM | POA: Diagnosis not present

## 2015-09-06 DIAGNOSIS — M79605 Pain in left leg: Secondary | ICD-10-CM | POA: Diagnosis not present

## 2015-09-06 DIAGNOSIS — M545 Low back pain: Secondary | ICD-10-CM | POA: Diagnosis not present

## 2015-09-06 DIAGNOSIS — M79604 Pain in right leg: Secondary | ICD-10-CM | POA: Diagnosis not present

## 2015-09-07 ENCOUNTER — Ambulatory Visit (INDEPENDENT_AMBULATORY_CARE_PROVIDER_SITE_OTHER): Payer: Medicare Other

## 2015-09-07 DIAGNOSIS — J309 Allergic rhinitis, unspecified: Secondary | ICD-10-CM | POA: Diagnosis not present

## 2015-09-08 DIAGNOSIS — M545 Low back pain: Secondary | ICD-10-CM | POA: Diagnosis not present

## 2015-09-08 DIAGNOSIS — M79604 Pain in right leg: Secondary | ICD-10-CM | POA: Diagnosis not present

## 2015-09-08 DIAGNOSIS — M6281 Muscle weakness (generalized): Secondary | ICD-10-CM | POA: Diagnosis not present

## 2015-09-08 DIAGNOSIS — M79605 Pain in left leg: Secondary | ICD-10-CM | POA: Diagnosis not present

## 2015-09-19 ENCOUNTER — Ambulatory Visit (INDEPENDENT_AMBULATORY_CARE_PROVIDER_SITE_OTHER): Payer: Medicare Other | Admitting: *Deleted

## 2015-09-19 DIAGNOSIS — J309 Allergic rhinitis, unspecified: Secondary | ICD-10-CM | POA: Diagnosis not present

## 2015-09-19 DIAGNOSIS — M25552 Pain in left hip: Secondary | ICD-10-CM | POA: Diagnosis not present

## 2015-09-19 DIAGNOSIS — M25562 Pain in left knee: Secondary | ICD-10-CM | POA: Diagnosis not present

## 2015-09-20 ENCOUNTER — Other Ambulatory Visit: Payer: Self-pay | Admitting: Orthopaedic Surgery

## 2015-09-20 DIAGNOSIS — M25562 Pain in left knee: Secondary | ICD-10-CM

## 2015-09-29 DIAGNOSIS — J3089 Other allergic rhinitis: Secondary | ICD-10-CM | POA: Diagnosis not present

## 2015-09-30 ENCOUNTER — Ambulatory Visit
Admission: RE | Admit: 2015-09-30 | Discharge: 2015-09-30 | Disposition: A | Discharge status: Home / Self Care | Payer: Medicare Other | Source: Ambulatory Visit | Attending: Orthopaedic Surgery | Admitting: Orthopaedic Surgery

## 2015-09-30 DIAGNOSIS — M179 Osteoarthritis of knee, unspecified: Secondary | ICD-10-CM | POA: Diagnosis not present

## 2015-09-30 DIAGNOSIS — J301 Allergic rhinitis due to pollen: Secondary | ICD-10-CM | POA: Diagnosis not present

## 2015-09-30 DIAGNOSIS — M25562 Pain in left knee: Secondary | ICD-10-CM

## 2015-10-04 ENCOUNTER — Encounter: Payer: Self-pay | Admitting: Cardiology

## 2015-10-04 DIAGNOSIS — M79605 Pain in left leg: Secondary | ICD-10-CM | POA: Diagnosis not present

## 2015-10-04 DIAGNOSIS — M545 Low back pain: Secondary | ICD-10-CM | POA: Diagnosis not present

## 2015-10-04 DIAGNOSIS — M6281 Muscle weakness (generalized): Secondary | ICD-10-CM | POA: Diagnosis not present

## 2015-10-04 DIAGNOSIS — M79604 Pain in right leg: Secondary | ICD-10-CM | POA: Diagnosis not present

## 2015-10-04 LAB — CUP PACEART REMOTE DEVICE CHECK
Battery Remaining Longevity: 86 mo
Battery Voltage: 3.01 V
Brady Statistic AP VP Percent: 16.81 %
Brady Statistic AS VP Percent: 83.17 %
Brady Statistic RA Percent Paced: 16.81 %
Brady Statistic RV Percent Paced: 99.98 %
Date Time Interrogation Session: 20170413132834
Implantable Lead Location: 753860
Implantable Lead Model: 5076
Implantable Lead Model: 5076
Lead Channel Impedance Value: 456 Ohm
Lead Channel Impedance Value: 608 Ohm
Lead Channel Impedance Value: 646 Ohm
Lead Channel Pacing Threshold Amplitude: 0.625 V
Lead Channel Pacing Threshold Amplitude: 1.125 V
Lead Channel Pacing Threshold Pulse Width: 0.4 ms
Lead Channel Sensing Intrinsic Amplitude: 27.75 mV
Lead Channel Sensing Intrinsic Amplitude: 3 mV
Lead Channel Setting Pacing Amplitude: 2.5 V
Lead Channel Setting Sensing Sensitivity: 0.9 mV
MDC IDC LEAD IMPLANT DT: 20140818
MDC IDC LEAD IMPLANT DT: 20140818
MDC IDC LEAD LOCATION: 753859
MDC IDC MSMT LEADCHNL RA IMPEDANCE VALUE: 304 Ohm
MDC IDC MSMT LEADCHNL RA SENSING INTR AMPL: 3 mV
MDC IDC MSMT LEADCHNL RV PACING THRESHOLD PULSEWIDTH: 0.4 ms
MDC IDC MSMT LEADCHNL RV SENSING INTR AMPL: 27.75 mV
MDC IDC SET LEADCHNL RA PACING AMPLITUDE: 2.25 V
MDC IDC SET LEADCHNL RV PACING PULSEWIDTH: 0.4 ms
MDC IDC STAT BRADY AP VS PERCENT: 0 %
MDC IDC STAT BRADY AS VS PERCENT: 0.02 %

## 2015-10-06 ENCOUNTER — Ambulatory Visit (INDEPENDENT_AMBULATORY_CARE_PROVIDER_SITE_OTHER): Payer: Medicare Other

## 2015-10-06 DIAGNOSIS — M6281 Muscle weakness (generalized): Secondary | ICD-10-CM | POA: Diagnosis not present

## 2015-10-06 DIAGNOSIS — J309 Allergic rhinitis, unspecified: Secondary | ICD-10-CM | POA: Diagnosis not present

## 2015-10-06 DIAGNOSIS — M545 Low back pain: Secondary | ICD-10-CM | POA: Diagnosis not present

## 2015-10-06 DIAGNOSIS — M79604 Pain in right leg: Secondary | ICD-10-CM | POA: Diagnosis not present

## 2015-10-06 DIAGNOSIS — M79605 Pain in left leg: Secondary | ICD-10-CM | POA: Diagnosis not present

## 2015-10-11 DIAGNOSIS — R030 Elevated blood-pressure reading, without diagnosis of hypertension: Secondary | ICD-10-CM | POA: Diagnosis not present

## 2015-10-11 DIAGNOSIS — G903 Multi-system degeneration of the autonomic nervous system: Secondary | ICD-10-CM | POA: Diagnosis not present

## 2015-10-12 DIAGNOSIS — M25552 Pain in left hip: Secondary | ICD-10-CM | POA: Diagnosis not present

## 2015-10-12 DIAGNOSIS — M25562 Pain in left knee: Secondary | ICD-10-CM | POA: Diagnosis not present

## 2015-10-13 DIAGNOSIS — M545 Low back pain: Secondary | ICD-10-CM | POA: Diagnosis not present

## 2015-10-13 DIAGNOSIS — M79605 Pain in left leg: Secondary | ICD-10-CM | POA: Diagnosis not present

## 2015-10-13 DIAGNOSIS — M79604 Pain in right leg: Secondary | ICD-10-CM | POA: Diagnosis not present

## 2015-10-13 DIAGNOSIS — M6281 Muscle weakness (generalized): Secondary | ICD-10-CM | POA: Diagnosis not present

## 2015-10-18 DIAGNOSIS — M6281 Muscle weakness (generalized): Secondary | ICD-10-CM | POA: Diagnosis not present

## 2015-10-18 DIAGNOSIS — M79604 Pain in right leg: Secondary | ICD-10-CM | POA: Diagnosis not present

## 2015-10-18 DIAGNOSIS — M79605 Pain in left leg: Secondary | ICD-10-CM | POA: Diagnosis not present

## 2015-10-18 DIAGNOSIS — M545 Low back pain: Secondary | ICD-10-CM | POA: Diagnosis not present

## 2015-10-20 DIAGNOSIS — M79604 Pain in right leg: Secondary | ICD-10-CM | POA: Diagnosis not present

## 2015-10-20 DIAGNOSIS — M6281 Muscle weakness (generalized): Secondary | ICD-10-CM | POA: Diagnosis not present

## 2015-10-20 DIAGNOSIS — M79605 Pain in left leg: Secondary | ICD-10-CM | POA: Diagnosis not present

## 2015-10-20 DIAGNOSIS — M545 Low back pain: Secondary | ICD-10-CM | POA: Diagnosis not present

## 2015-10-26 ENCOUNTER — Other Ambulatory Visit: Payer: Self-pay | Admitting: Allergy and Immunology

## 2015-11-09 DIAGNOSIS — M79604 Pain in right leg: Secondary | ICD-10-CM | POA: Diagnosis not present

## 2015-11-09 DIAGNOSIS — M79605 Pain in left leg: Secondary | ICD-10-CM | POA: Diagnosis not present

## 2015-11-09 DIAGNOSIS — M6281 Muscle weakness (generalized): Secondary | ICD-10-CM | POA: Diagnosis not present

## 2015-11-09 DIAGNOSIS — M545 Low back pain: Secondary | ICD-10-CM | POA: Diagnosis not present

## 2015-11-10 ENCOUNTER — Ambulatory Visit (INDEPENDENT_AMBULATORY_CARE_PROVIDER_SITE_OTHER): Payer: Medicare Other

## 2015-11-10 DIAGNOSIS — M545 Low back pain: Secondary | ICD-10-CM | POA: Diagnosis not present

## 2015-11-10 DIAGNOSIS — J309 Allergic rhinitis, unspecified: Secondary | ICD-10-CM | POA: Diagnosis not present

## 2015-11-10 DIAGNOSIS — M79605 Pain in left leg: Secondary | ICD-10-CM | POA: Diagnosis not present

## 2015-11-10 DIAGNOSIS — M79604 Pain in right leg: Secondary | ICD-10-CM | POA: Diagnosis not present

## 2015-11-10 DIAGNOSIS — M25551 Pain in right hip: Secondary | ICD-10-CM | POA: Diagnosis not present

## 2015-11-10 DIAGNOSIS — M6281 Muscle weakness (generalized): Secondary | ICD-10-CM | POA: Diagnosis not present

## 2015-11-10 DIAGNOSIS — M542 Cervicalgia: Secondary | ICD-10-CM | POA: Diagnosis not present

## 2015-11-17 DIAGNOSIS — M79605 Pain in left leg: Secondary | ICD-10-CM | POA: Diagnosis not present

## 2015-11-17 DIAGNOSIS — M545 Low back pain: Secondary | ICD-10-CM | POA: Diagnosis not present

## 2015-11-17 DIAGNOSIS — M79604 Pain in right leg: Secondary | ICD-10-CM | POA: Diagnosis not present

## 2015-11-17 DIAGNOSIS — M6281 Muscle weakness (generalized): Secondary | ICD-10-CM | POA: Diagnosis not present

## 2015-11-22 DIAGNOSIS — M6281 Muscle weakness (generalized): Secondary | ICD-10-CM | POA: Diagnosis not present

## 2015-11-22 DIAGNOSIS — M79605 Pain in left leg: Secondary | ICD-10-CM | POA: Diagnosis not present

## 2015-11-22 DIAGNOSIS — M545 Low back pain: Secondary | ICD-10-CM | POA: Diagnosis not present

## 2015-11-22 DIAGNOSIS — M79604 Pain in right leg: Secondary | ICD-10-CM | POA: Diagnosis not present

## 2015-11-24 ENCOUNTER — Ambulatory Visit (INDEPENDENT_AMBULATORY_CARE_PROVIDER_SITE_OTHER): Payer: Medicare Other | Admitting: *Deleted

## 2015-11-24 DIAGNOSIS — I442 Atrioventricular block, complete: Secondary | ICD-10-CM

## 2015-11-24 DIAGNOSIS — Z950 Presence of cardiac pacemaker: Secondary | ICD-10-CM | POA: Diagnosis not present

## 2015-11-24 DIAGNOSIS — M545 Low back pain: Secondary | ICD-10-CM | POA: Diagnosis not present

## 2015-11-24 DIAGNOSIS — M79605 Pain in left leg: Secondary | ICD-10-CM | POA: Diagnosis not present

## 2015-11-24 DIAGNOSIS — M79604 Pain in right leg: Secondary | ICD-10-CM | POA: Diagnosis not present

## 2015-11-24 DIAGNOSIS — M6281 Muscle weakness (generalized): Secondary | ICD-10-CM | POA: Diagnosis not present

## 2015-11-24 NOTE — Progress Notes (Signed)
Remote pacemaker transmission.   

## 2015-11-29 DIAGNOSIS — M6281 Muscle weakness (generalized): Secondary | ICD-10-CM | POA: Diagnosis not present

## 2015-11-29 DIAGNOSIS — M545 Low back pain: Secondary | ICD-10-CM | POA: Diagnosis not present

## 2015-11-29 DIAGNOSIS — M79604 Pain in right leg: Secondary | ICD-10-CM | POA: Diagnosis not present

## 2015-11-29 DIAGNOSIS — M79605 Pain in left leg: Secondary | ICD-10-CM | POA: Diagnosis not present

## 2015-11-29 LAB — CUP PACEART REMOTE DEVICE CHECK
Battery Remaining Longevity: 84 mo
Battery Voltage: 3.01 V
Brady Statistic AP VP Percent: 14.85 %
Brady Statistic AS VP Percent: 85.13 %
Brady Statistic AS VS Percent: 0.02 %
Brady Statistic RA Percent Paced: 14.85 %
Implantable Lead Implant Date: 20140818
Implantable Lead Location: 753860
Implantable Lead Model: 5076
Lead Channel Impedance Value: 608 Ohm
Lead Channel Pacing Threshold Amplitude: 0.5 V
Lead Channel Pacing Threshold Amplitude: 1.125 V
Lead Channel Pacing Threshold Pulse Width: 0.4 ms
Lead Channel Pacing Threshold Pulse Width: 0.4 ms
Lead Channel Sensing Intrinsic Amplitude: 1.625 mV
Lead Channel Sensing Intrinsic Amplitude: 24.25 mV
Lead Channel Setting Pacing Amplitude: 2.5 V
Lead Channel Setting Pacing Pulse Width: 0.4 ms
MDC IDC LEAD IMPLANT DT: 20140818
MDC IDC LEAD LOCATION: 753859
MDC IDC MSMT LEADCHNL RA IMPEDANCE VALUE: 323 Ohm
MDC IDC MSMT LEADCHNL RA IMPEDANCE VALUE: 456 Ohm
MDC IDC MSMT LEADCHNL RA SENSING INTR AMPL: 1.625 mV
MDC IDC MSMT LEADCHNL RV IMPEDANCE VALUE: 665 Ohm
MDC IDC MSMT LEADCHNL RV SENSING INTR AMPL: 24.25 mV
MDC IDC SESS DTM: 20170713143410
MDC IDC SET LEADCHNL RV PACING AMPLITUDE: 2.5 V
MDC IDC SET LEADCHNL RV SENSING SENSITIVITY: 0.9 mV
MDC IDC STAT BRADY AP VS PERCENT: 0 %
MDC IDC STAT BRADY RV PERCENT PACED: 99.98 %

## 2015-11-30 ENCOUNTER — Encounter: Payer: Self-pay | Admitting: Cardiology

## 2015-12-01 ENCOUNTER — Ambulatory Visit (INDEPENDENT_AMBULATORY_CARE_PROVIDER_SITE_OTHER): Payer: Medicare Other

## 2015-12-01 DIAGNOSIS — M545 Low back pain: Secondary | ICD-10-CM | POA: Diagnosis not present

## 2015-12-01 DIAGNOSIS — J309 Allergic rhinitis, unspecified: Secondary | ICD-10-CM | POA: Diagnosis not present

## 2015-12-06 DIAGNOSIS — M79605 Pain in left leg: Secondary | ICD-10-CM | POA: Diagnosis not present

## 2015-12-06 DIAGNOSIS — M6281 Muscle weakness (generalized): Secondary | ICD-10-CM | POA: Diagnosis not present

## 2015-12-06 DIAGNOSIS — M79604 Pain in right leg: Secondary | ICD-10-CM | POA: Diagnosis not present

## 2015-12-06 DIAGNOSIS — M545 Low back pain: Secondary | ICD-10-CM | POA: Diagnosis not present

## 2015-12-08 DIAGNOSIS — M79604 Pain in right leg: Secondary | ICD-10-CM | POA: Diagnosis not present

## 2015-12-08 DIAGNOSIS — M6281 Muscle weakness (generalized): Secondary | ICD-10-CM | POA: Diagnosis not present

## 2015-12-08 DIAGNOSIS — M545 Low back pain: Secondary | ICD-10-CM | POA: Diagnosis not present

## 2015-12-08 DIAGNOSIS — M79605 Pain in left leg: Secondary | ICD-10-CM | POA: Diagnosis not present

## 2015-12-13 DIAGNOSIS — M79605 Pain in left leg: Secondary | ICD-10-CM | POA: Diagnosis not present

## 2015-12-13 DIAGNOSIS — M6281 Muscle weakness (generalized): Secondary | ICD-10-CM | POA: Diagnosis not present

## 2015-12-13 DIAGNOSIS — M79604 Pain in right leg: Secondary | ICD-10-CM | POA: Diagnosis not present

## 2015-12-13 DIAGNOSIS — M545 Low back pain: Secondary | ICD-10-CM | POA: Diagnosis not present

## 2015-12-15 DIAGNOSIS — Z6824 Body mass index (BMI) 24.0-24.9, adult: Secondary | ICD-10-CM | POA: Diagnosis not present

## 2015-12-15 DIAGNOSIS — H8193 Unspecified disorder of vestibular function, bilateral: Secondary | ICD-10-CM | POA: Diagnosis not present

## 2015-12-15 DIAGNOSIS — M5412 Radiculopathy, cervical region: Secondary | ICD-10-CM | POA: Diagnosis not present

## 2015-12-15 DIAGNOSIS — G909 Disorder of the autonomic nervous system, unspecified: Secondary | ICD-10-CM | POA: Diagnosis not present

## 2015-12-20 ENCOUNTER — Ambulatory Visit (INDEPENDENT_AMBULATORY_CARE_PROVIDER_SITE_OTHER): Payer: Medicare Other | Admitting: *Deleted

## 2015-12-20 DIAGNOSIS — M79604 Pain in right leg: Secondary | ICD-10-CM | POA: Diagnosis not present

## 2015-12-20 DIAGNOSIS — J309 Allergic rhinitis, unspecified: Secondary | ICD-10-CM | POA: Diagnosis not present

## 2015-12-20 DIAGNOSIS — M545 Low back pain: Secondary | ICD-10-CM | POA: Diagnosis not present

## 2015-12-20 DIAGNOSIS — M79605 Pain in left leg: Secondary | ICD-10-CM | POA: Diagnosis not present

## 2015-12-20 DIAGNOSIS — M6281 Muscle weakness (generalized): Secondary | ICD-10-CM | POA: Diagnosis not present

## 2015-12-22 DIAGNOSIS — Z9189 Other specified personal risk factors, not elsewhere classified: Secondary | ICD-10-CM | POA: Diagnosis not present

## 2015-12-22 DIAGNOSIS — Z79890 Hormone replacement therapy (postmenopausal): Secondary | ICD-10-CM | POA: Diagnosis not present

## 2015-12-22 DIAGNOSIS — Z779 Other contact with and (suspected) exposures hazardous to health: Secondary | ICD-10-CM | POA: Diagnosis not present

## 2015-12-22 DIAGNOSIS — N952 Postmenopausal atrophic vaginitis: Secondary | ICD-10-CM | POA: Diagnosis not present

## 2015-12-22 DIAGNOSIS — Z853 Personal history of malignant neoplasm of breast: Secondary | ICD-10-CM | POA: Diagnosis not present

## 2015-12-23 DIAGNOSIS — L853 Xerosis cutis: Secondary | ICD-10-CM | POA: Diagnosis not present

## 2015-12-23 DIAGNOSIS — L821 Other seborrheic keratosis: Secondary | ICD-10-CM | POA: Diagnosis not present

## 2015-12-23 DIAGNOSIS — L570 Actinic keratosis: Secondary | ICD-10-CM | POA: Diagnosis not present

## 2015-12-23 DIAGNOSIS — D692 Other nonthrombocytopenic purpura: Secondary | ICD-10-CM | POA: Diagnosis not present

## 2015-12-23 DIAGNOSIS — D1801 Hemangioma of skin and subcutaneous tissue: Secondary | ICD-10-CM | POA: Diagnosis not present

## 2015-12-23 DIAGNOSIS — L814 Other melanin hyperpigmentation: Secondary | ICD-10-CM | POA: Diagnosis not present

## 2015-12-26 DIAGNOSIS — H8113 Benign paroxysmal vertigo, bilateral: Secondary | ICD-10-CM | POA: Diagnosis not present

## 2016-01-02 DIAGNOSIS — Z780 Asymptomatic menopausal state: Secondary | ICD-10-CM | POA: Diagnosis not present

## 2016-01-04 DIAGNOSIS — M1711 Unilateral primary osteoarthritis, right knee: Secondary | ICD-10-CM | POA: Diagnosis not present

## 2016-01-17 ENCOUNTER — Ambulatory Visit (INDEPENDENT_AMBULATORY_CARE_PROVIDER_SITE_OTHER): Payer: Medicare Other

## 2016-01-17 DIAGNOSIS — J309 Allergic rhinitis, unspecified: Secondary | ICD-10-CM

## 2016-01-19 DIAGNOSIS — H8113 Benign paroxysmal vertigo, bilateral: Secondary | ICD-10-CM | POA: Diagnosis not present

## 2016-01-25 DIAGNOSIS — H8113 Benign paroxysmal vertigo, bilateral: Secondary | ICD-10-CM | POA: Diagnosis not present

## 2016-01-27 ENCOUNTER — Ambulatory Visit: Payer: Self-pay

## 2016-01-27 DIAGNOSIS — J309 Allergic rhinitis, unspecified: Secondary | ICD-10-CM

## 2016-02-02 ENCOUNTER — Ambulatory Visit (INDEPENDENT_AMBULATORY_CARE_PROVIDER_SITE_OTHER): Payer: Medicare Other

## 2016-02-02 DIAGNOSIS — J309 Allergic rhinitis, unspecified: Secondary | ICD-10-CM

## 2016-02-06 ENCOUNTER — Ambulatory Visit (INDEPENDENT_AMBULATORY_CARE_PROVIDER_SITE_OTHER): Payer: Medicare Other | Admitting: *Deleted

## 2016-02-06 DIAGNOSIS — M25562 Pain in left knee: Secondary | ICD-10-CM | POA: Diagnosis not present

## 2016-02-06 DIAGNOSIS — H532 Diplopia: Secondary | ICD-10-CM | POA: Diagnosis not present

## 2016-02-06 DIAGNOSIS — N644 Mastodynia: Secondary | ICD-10-CM | POA: Diagnosis not present

## 2016-02-06 DIAGNOSIS — R200 Anesthesia of skin: Secondary | ICD-10-CM | POA: Diagnosis not present

## 2016-02-06 DIAGNOSIS — D692 Other nonthrombocytopenic purpura: Secondary | ICD-10-CM | POA: Diagnosis not present

## 2016-02-06 DIAGNOSIS — J309 Allergic rhinitis, unspecified: Secondary | ICD-10-CM | POA: Diagnosis not present

## 2016-02-06 DIAGNOSIS — M25552 Pain in left hip: Secondary | ICD-10-CM | POA: Diagnosis not present

## 2016-02-06 DIAGNOSIS — M545 Low back pain: Secondary | ICD-10-CM | POA: Diagnosis not present

## 2016-02-06 DIAGNOSIS — G909 Disorder of the autonomic nervous system, unspecified: Secondary | ICD-10-CM | POA: Diagnosis not present

## 2016-02-06 DIAGNOSIS — H8193 Unspecified disorder of vestibular function, bilateral: Secondary | ICD-10-CM | POA: Diagnosis not present

## 2016-02-06 DIAGNOSIS — M5412 Radiculopathy, cervical region: Secondary | ICD-10-CM | POA: Diagnosis not present

## 2016-02-06 DIAGNOSIS — R260 Ataxic gait: Secondary | ICD-10-CM | POA: Diagnosis not present

## 2016-02-06 DIAGNOSIS — Z6823 Body mass index (BMI) 23.0-23.9, adult: Secondary | ICD-10-CM | POA: Diagnosis not present

## 2016-02-16 ENCOUNTER — Ambulatory Visit (INDEPENDENT_AMBULATORY_CARE_PROVIDER_SITE_OTHER): Payer: Medicare Other | Admitting: Neurology

## 2016-02-16 ENCOUNTER — Ambulatory Visit (INDEPENDENT_AMBULATORY_CARE_PROVIDER_SITE_OTHER): Payer: Medicare Other | Admitting: *Deleted

## 2016-02-16 ENCOUNTER — Encounter: Payer: Self-pay | Admitting: Neurology

## 2016-02-16 VITALS — BP 135/78 | HR 66 | Ht 67.0 in | Wt 153.0 lb

## 2016-02-16 DIAGNOSIS — R42 Dizziness and giddiness: Secondary | ICD-10-CM | POA: Diagnosis not present

## 2016-02-16 DIAGNOSIS — J309 Allergic rhinitis, unspecified: Secondary | ICD-10-CM

## 2016-02-16 MED ORDER — MECLIZINE HCL 25 MG PO TABS: 12.5000 mg | ORAL_TABLET | Freq: Three times a day (TID) | ORAL | 1 refills | Status: DC

## 2016-02-16 NOTE — Patient Instructions (Addendum)

## 2016-02-16 NOTE — Progress Notes (Signed)
Reason for visit: Dizziness  Referring physician: Dr. Cindra Presume is a 70 y.o. female  History of present illness:  [name] is a 70 year old right-handed white female with a history of dizziness on a chronic basis, she was seen here previously by Dr. Erling Cruz. The patient has undergone vestibular therapy in the past for this issue. The patient has also been followed through [facility] for orthostatic hypotension, currently on midodrine. The patient has apparently sustained a fall on 11/07/2015 while up on a stepladder. The patient struck her head and did not lose consciousness. Three weeks after the fall, she began having some problems with feeling dizzy. She reports a rocking sensation, associated with some nausea and slight bifrontal headache. The patient has had some neck stiffness and shoulder discomfort. The patient has been getting physical therapy for this. The patient has a chronic issue with intermittent double vision. This has continued. She does not report any further falls. She has chronic problems with right-sided numbness and this has persisted. The patient denies any issues controlling the bowels or the bladder. She is sent to this office for further evaluation. She reports no ear pain or change in hearing. She has a pacemaker in place at this time.  Past Medical History:  Diagnosis Date  . ADHD (attention deficit hyperactivity disorder)   . Allergy    trees/pollen, mold, fungus, dust mites. Takes allergy shots  . Arthritis    PAIN AND OA LEFT HIP  . Asthma    allergist Dr Olena Heckle- monthly allergy injections  . Autonomic dysfunction    CENTRAL NERVOUS SYSTEM NEUROPATHY - DX BY DR. Erling Cruz MORE THAN 10 YRS AGO - AND IT IS FELT TO CONTRIBUTE TO THE AUTOMIC  DYSFUNCTION-- PT HAS NUMBNESS LEGS AND FEET AND SOMETIIMES TIPS OF FINGER, SEVERE CONSTIPATION( NO SENSATION TO HAVE BM ), DOUBLE VISION, ORTHOSTATIC HYPOTENSION  . Blood transfusion   . Breast cancer  ([place])    right side  . Bruises easily   . Cataract   . Chest pain    a. 12/2012 Cath: nl cors, EF 55-65%.  . CHF (congestive heart failure) ([place])   . Complication of anesthesia    reaction to some anesthetics/ 7/12 anesth record on chart- states prefers epidural  . Depression   . Diverticulosis   . Dysrhythmia    HX OF HIGH GRADE HEART BLOCK - REQUIRED PACEMAKER INSERTION  . Esophageal stricture   . Gastritis   . GERD (gastroesophageal reflux disease)   . H/O hiatal hernia   . Hemorrhoids   . Hernia of abdominal wall    spigelian hernia RLQ - SURGERY TO REPAIR  . Hypothyroidism   . Interstitial cystitis   . Latex allergy, contact dermatitis   . Mallory - Weiss tear    HEALED   . Neuromuscular disorder ([facility])    central nervous system neuropathy- seen per Dr Erling Cruz  . Pacemaker   . Pericardial effusion    a. 12/2012 following ppm placement;  b. 01/01/2013 Echo: EF 55-60%, small pericardial effusion w/o RV collapse-->No need for tap/window.  Marland Kitchen PONV (postoperative nausea and vomiting)    pt needs scop patch  . Recurrent upper respiratory infection (URI) 1/13- to present   bronchitis following surgery- states improved but still with cough. OV with [facility] Dr [name] 09/06/11 on chart  . Shortness of breath    AT TIMES - BUT MUCH IMPROVED AFTER PACEMAKER WAS REPROGRAMED.  Marland Kitchen Symptomatic bradycardia    a.  12/2012 s/p MDT dual chamber PPM, ser # [id number]; b. 12/2012 post-op course complicated by pericardial effusinon req lead revision.  . Thyroid disease     Past Surgical History:  Procedure Laterality Date  . ABDOMINAL HYSTERECTOMY  1974  . BACK SURGERY     cervical fusion 4-5 with plate  . BLADDER SUSPENSION    . BREAST BIOPSY  2002   NO BLOOD PRESSURES ON RIGHT SIDE/   s/p  axillary node dissection  . CYSTO WITH HYDRODISTENSION  09/07/2011   Procedure: CYSTOSCOPY/HYDRODISTENSION;  Surgeon: Ailene Rud, MD;  Location: [facility];  Service: Urology;  Laterality: N/A;   INSTILLATION OF MARCAINE/PYRIDIUM INSTILLATION OF MARCAINE/KENALOG  . CYSTOSCOPY  1975, 2007  . EYE SURGERY     LASIK EYE SURGERY BILATERAL  . LAPAROSCOPY  1973  . LEAD REVISION N/A 12/30/2012   Procedure: LEAD REVISION;  Surgeon: Evans Lance, MD;  [facility] C[facility] LAB;  Service: Cardiovascular;  Laterality: N/A;  . LEFT HEART CATHETERIZATION WITH CORONARY ANGIOGRAM N/A 12/29/2012   Procedure: LEFT HEART CATHETERIZATION WITH CORONARY ANGIOGRAM;  Surgeon: Peter M Martinique, MD;  Location: [facility] CATH LAB;  Service: Cardiovascular;  Laterality: N/A;  . MASTECTOMY MODIFIED RADICAL     right; with immediate reconstruction  . MYRINGOPLASTY  1962  . NECK SURGERY     c4-5 ruptured disc  . OOPHORECTOMY  1982  . PACEMAKER INSERTION    . PERMANENT PACEMAKER INSERTION N/A 12/29/2012   Procedure: PERMANENT PACEMAKER INSERTION;  Surgeon: Deboraha Sprang, MD;  Location: [facility] CATH LAB;  Service: Cardiovascular;  Laterality: N/A;  . RECTOCELE REPAIR     with cystocele repair  . TONSILLECTOMY    . TOTAL HIP ARTHROPLASTY Right   . TOTAL HIP ARTHROPLASTY Left 06/12/2013   Procedure: LEFT TOTAL HIP ARTHROPLASTY ANTERIOR APPROACH;  Surgeon: Mcarthur Rossetti, MD;  Location: [facility];  Service: Orthopedics;  Laterality: Left;  . TYMPANOPLASTY  1973   right  . ULNAR NERVE TRANSPOSITION Right   . VENTRAL HERNIA REPAIR  05/30/2011   Procedure: HERNIA REPAIR VENTRAL ADULT;  Surgeon: Haywood Lasso, MD;  Location: [facility];  Service: General;  Laterality: Right;  repair right spigelian hernia    Family History  Problem Relation Age of Onset  . Heart disease Father   . Colon cancer Mother 37  . Depression Mother   . Pancreatic cancer Sister 2  . Diverticulitis Sister   . Uterine cancer Maternal Grandmother   . Heart disease Brother   . Heart disease Paternal Grandmother   . Heart disease Paternal Grandfather   . Throat cancer Maternal Uncle   . Heart disease Paternal Aunt   . Heart  disease Paternal Uncle   . Heart disease Brother   . Thrombosis Maternal Aunt   . Cancer Maternal Uncle     NOS  . Diabetes      aunt    Social history:  reports that she has never smoked. She has never used smokeless tobacco. She reports that she does not drink alcohol or use drugs.  Medications:  Prior to Admission medications   Medication Sig Start Date End Date Taking? Authorizing Provider  acetaminophen (TYLENOL) 325 MG tablet Take 2 tablets (650 mg total) by mouth every 6 (six) hours as needed for mild pain. 10/08/14  Yes Belkys A Regalado, MD  albuterol (PROVENTIL HFA;VENTOLIN HFA) 108 (90 BASE) MCG/ACT inhaler Inhale 2 puffs into the lungs every 6 (six) hours as needed. For shortness  of breath   Yes Historical Provider, MD  b complex vitamins capsule Take 1 capsule by mouth daily.    Yes Historical Provider, MD  cholecalciferol (VITAMIN D) 1000 UNITS tablet Take 1,000 Units by mouth daily.   Yes Historical Provider, MD  ciclesonide (OMNARIS) 50 MCG/ACT nasal spray Place 2 sprays into both nostrils as needed for allergies.    Yes Historical Provider, MD  EPINEPHrine (EPIPEN 2-PAK) 0.3 mg/0.3 mL IJ SOAJ injection Inject 0.3 mLs (0.3 mg total) into the muscle once. 03/24/15  Yes Jiles Prows, MD  escitalopram (LEXAPRO) 10 MG tablet Take 10 mg by mouth daily.   Yes Historical Provider, MD  esomeprazole (NEXIUM) 40 MG capsule Take 1 capsule (40 mg total) by mouth daily. 04/28/15  Yes Jiles Prows, MD  estradiol (ESTRING) 2 MG vaginal ring Place 2 mg vaginally every 3 (three) months. follow package directions   Yes Historical Provider, MD  estradiol (VIVELLE-DOT) 0.025 MG/24HR Place 1 patch onto the skin 2 (two) times a week. Applies new patch on Sunday & Thursday   Yes Historical Provider, MD  fexofenadine (ALLEGRA) 180 MG tablet Take 180 mg by mouth daily.    Yes Historical Provider, MD  KLOR-CON M10 10 MEQ tablet TAKE 1 TABLET DAILY 05/10/15  Yes Deboraha Sprang, MD  levothyroxine  (SYNTHROID, LEVOTHROID) 88 MCG tablet Take 100 mcg by mouth daily before breakfast.   Yes Historical Provider, MD  Melatonin 3 MG TABS Take 1 tablet by mouth at bedtime as needed.   Yes Historical Provider, MD  midodrine (PROAMATINE) 10 MG tablet Take 1 tablet (10 mg total) by mouth 3 (three) times daily with meals. 10/08/14  Yes Belkys A Regalado, MD  mometasone-formoterol (DULERA) 200-5 MCG/ACT AERO Inhale 2 puffs into the lungs 2 (two) times daily as needed.    Yes Historical Provider, MD  montelukast (SINGULAIR) 10 MG tablet Take 10 mg by mouth as needed (allergies).    Yes Historical Provider, MD  ondansetron (ZOFRAN ODT) 4 MG disintegrating tablet Take 1 tablet (4 mg total) by mouth every 8 (eight) hours as needed for nausea or vomiting. 10/13/14  Yes Fredia Sorrow, MD  PRESCRIPTION MEDICATION ALLERGY SHOT EVERY 2 WEEKS BY DR. Neldon Mc   Yes Historical Provider, MD  Probiotic Product (ALIGN) 4 MG CAPS Take 4 mg by mouth at bedtime.    Yes Historical Provider, MD      Allergies  Allergen Reactions  . Anesthetics, Amide Other (See Comments)    Patient unsure of names, however multiples cause swelling of airway & nausea  . Clindamycin Swelling  . Neomycin Swelling  . Shellfish Allergy Other (See Comments)    Neurotoxic reaction  . Sulfonamide Derivatives Anaphylaxis and Swelling  . Zolpidem Tartrate Other (See Comments)    Jerking motions   . Azithromycin Other (See Comments)    Severe gastritis   . Bactroban Other (See Comments)    Causes sores in nose  . Ciprofloxacin     REACTION: joint swelling  . Codeine Swelling  . Meperidine Hcl Nausea Only    Hallucinations   . Morphine Nausea Only    Hallucinations   . Neosporin [Neomycin-Polymyxin-Gramicidin] Hives and Dermatitis    All topical "orin's ointment"  . Nitrofurantoin     REACTION: neuropathy in legs  . Other     ALLERGIC TO WARM WATER SHELLFISH  PT STATES PLEASE NOTE SHE CAN TAKE OXYCODONE AND TYLENOL FOR PAIN -NOT  ALLERGIC  . Penicillins Other (See Comments)  Swelling in joints  . Tramadol     Makes jerk  . Latex Rash    ROS:  Out of a complete 14 system review of symptoms, the patient complains only of the following symptoms, and all other reviewed systems are negative.  Chills Hearing loss, ringing in the ears, dizziness Double vision Easy bruising Feeling cold Joint pains Allergies Weakness, slurred speech, dizziness Anxiety, decreased energy, disinterest in activities, racing thoughts Restless legs  Blood pressure 135/78, pulse 66, height 5\' 7"  (1.702 m), weight 153 lb (69.4 kg).   Blood pressure, sitting, left arm is 154/80. Blood pressure, left arm, standing is 154/84.  Physical Exam  General: The patient is alert and cooperative at the time of the examination.  Eyes: Pupils are equal, round, and reactive to light. Discs are flat bilaterally.  Ears: Tympanic membranes are clear bilaterally.  Neck: The neck is supple, no carotid bruits are noted.  Respiratory: The respiratory examination is clear.  Cardiovascular: The cardiovascular examination reveals a regular rate and rhythm, no obvious murmurs or rubs are noted.  Skin: Extremities are without significant edema.  Neurologic Exam  Mental status: The patient is alert and oriented x 3 at the time of the examination. The patient has apparent normal recent and remote memory, with an apparently normal attention span and concentration ability.  Cranial nerves: Facial symmetry is present. There is good sensation of the face to pinprick and soft touch bilaterally. The strength of the facial muscles and the muscles to head turning and shoulder shrug are normal bilaterally. Speech is well enunciated, no aphasia or dysarthria is noted. Extraocular movements are full. Visual fields are full. The tongue is midline, and the patient has symmetric elevation of the soft palate. No obvious hearing deficits are noted.  Motor: The motor  testing reveals 5 over 5 strength of all 4 extremities. Good symmetric motor tone is noted throughout.  Sensory: Sensory testing is intact to pinprick, soft touch, vibration sensation, and position sense on all 4 extremities, with exception of some pinprick sensory decreased on the right arm and right leg, vibration sensation decreased on the right foot. No evidence of extinction is noted.  Coordination: Cerebellar testing reveals good finger-nose-finger and heel-to-shin bilaterally. The Nyan-Barrany procedure was negative.  Gait and station: Gait is normal. Tandem gait is normal. Romberg is negative. No drift is seen.  Reflexes: Deep tendon reflexes are symmetric and normal bilaterally. Toes are downgoing bilaterally.   Assessment/Plan:  1. Dizziness, status post fall  The patient has had a chronic history of intermittent dizziness. The patient has had some sensation of rocking that came on about 3 weeks following a fall. This potentially could be a posttraumatic vertigo issue. The patient will be sent for CT of the head, if this is unremarkable, she may enter vestibular rehabilitation. The patient will be given a low dose prescription for meclizine taking 12.5 mg 3 times daily. She will follow-up in 3 months.  [name] Alexanders MD 02/16/2016 11:46 AM  [facility] [address] [place], [place] [address]  Phone [phone] Fax [phone]

## 2016-02-22 ENCOUNTER — Ambulatory Visit
Admission: RE | Admit: 2016-02-22 | Discharge: 2016-02-22 | Disposition: A | Discharge status: Home / Self Care | Payer: Medicare Other | Source: Ambulatory Visit | Attending: Neurology | Admitting: Neurology

## 2016-02-22 DIAGNOSIS — R42 Dizziness and giddiness: Secondary | ICD-10-CM | POA: Diagnosis not present

## 2016-02-22 DIAGNOSIS — H538 Other visual disturbances: Secondary | ICD-10-CM | POA: Diagnosis not present

## 2016-02-23 ENCOUNTER — Telehealth: Payer: Self-pay | Admitting: Neurology

## 2016-02-23 DIAGNOSIS — H8110 Benign paroxysmal vertigo, unspecified ear: Secondary | ICD-10-CM

## 2016-02-23 NOTE — Telephone Encounter (Signed)
I called patient, CT the head is unremarkable, we will initiate vestibular rehabilitation.   CT head results 02/23/16:  IMPRESSION:  Unremarkable CT head (without). No acute findings. Compared to CT on 10/06/14 there are no major changes, other than the presence of new medical devices within the right ear.

## 2016-02-27 ENCOUNTER — Ambulatory Visit (INDEPENDENT_AMBULATORY_CARE_PROVIDER_SITE_OTHER): Payer: Medicare Other | Admitting: Internal Medicine

## 2016-02-27 ENCOUNTER — Encounter: Payer: Self-pay | Admitting: Internal Medicine

## 2016-02-27 VITALS — BP 110/60 | HR 65 | Ht 67.0 in | Wt 157.0 lb

## 2016-02-27 DIAGNOSIS — Z950 Presence of cardiac pacemaker: Secondary | ICD-10-CM

## 2016-02-27 DIAGNOSIS — I442 Atrioventricular block, complete: Secondary | ICD-10-CM | POA: Diagnosis not present

## 2016-02-27 LAB — CUP PACEART INCLINIC DEVICE CHECK
Battery Remaining Longevity: 81 mo
Battery Voltage: 3.01 V
Brady Statistic AP VP Percent: 14.48 %
Brady Statistic AP VS Percent: 0 %
Brady Statistic AS VP Percent: 85.5 %
Brady Statistic AS VS Percent: 0.02 %
Brady Statistic RA Percent Paced: 14.48 %
Brady Statistic RV Percent Paced: 99.98 %
Date Time Interrogation Session: 20171016173907
Implantable Lead Implant Date: 20140818
Implantable Lead Implant Date: 20140818
Implantable Lead Location: 753859
Implantable Lead Location: 753860
Implantable Lead Model: 5076
Implantable Lead Model: 5076
Lead Channel Impedance Value: 342 Ohm
Lead Channel Impedance Value: 475 Ohm
Lead Channel Impedance Value: 570 Ohm
Lead Channel Impedance Value: 608 Ohm
Lead Channel Pacing Threshold Amplitude: 0.75 V
Lead Channel Pacing Threshold Amplitude: 1 V
Lead Channel Pacing Threshold Pulse Width: 0.4 ms
Lead Channel Pacing Threshold Pulse Width: 0.4 ms
Lead Channel Sensing Intrinsic Amplitude: 1.375 mV
Lead Channel Sensing Intrinsic Amplitude: 2 mV
Lead Channel Sensing Intrinsic Amplitude: 6.375 mV
Lead Channel Sensing Intrinsic Amplitude: 6.375 mV
Lead Channel Setting Pacing Amplitude: 2.25 V
Lead Channel Setting Pacing Amplitude: 2.5 V
Lead Channel Setting Pacing Pulse Width: 0.4 ms
Lead Channel Setting Sensing Sensitivity: 0.9 mV

## 2016-02-27 MED ORDER — MIDODRINE HCL 2.5 MG PO TABS
2.5000 mg | ORAL_TABLET | Freq: Three times a day (TID) | ORAL | 0 refills | Status: DC
Start: 2016-02-27 — End: 2017-07-31

## 2016-02-27 NOTE — Patient Instructions (Addendum)
Medication Instructions: - Your physician has recommended you make the following change in your medication:  1) Decrease midodrine (proamitine) to 5 mg three times a day x 2 weeks,  then decrease to 2.5 mg three times a day x 2 weeks,  then stop  Labwork: - none ordered  Procedures/Testing: - none ordered  Follow-Up: - Remote monitoring is used to monitor your Pacemaker of ICD from home. This monitoring reduces the number of office visits required to check your device to one time per year. It allows [place] to keep an eye on the functioning of your device to ensure it is working properly. You are scheduled for a device check from home on 05/28/16. You may send your transmission at any time that day. If you have a wireless device, the transmission will be sent automatically. After your physician reviews your transmission, you will receive a postcard with your next transmission date.  - Your physician wants you to follow-up in: 1 year with Dr. Caryl Comes. You will receive a reminder letter in the mail two months in advance. If you don't receive a letter, please call our office to schedule the follow-up appointment.  Any Additional Special Instructions Will Be Listed Below (If Applicable).     If you need a refill on your cardiac medications before your next appointment, please call your pharmacy.

## 2016-02-27 NOTE — Progress Notes (Signed)
A and a and     Patient Care Team: Burnard Bunting, MD as PCP - General (Internal Medicine) Chucky May, MD as Consulting Physician (Psychiatry)   HPI  [name] is a 70 y.o. female Seen in followup for high grade heart block for which she underwent pacing compllicated by microperforation modest effusion and then adderall withdrawal   She is far  better following reprogramming of the device after we noted upper rate behavior limiting heart rate excursion she still has complaints sometimes and exercise intolerance on stairs   .  She reminded that she has a diagnosis of central polyneuropathy;    She has been followed at [facility] with Marilynne Drivers. She was last seen 5/17. She was noted at that time to be somewhat hypertensive and because of that and diarrhea her Mestinon was decreased. She was to be continued on ProAmatine.  Echo EF 5/16 normal  She also has complaints of irregular palpitations that occur after she goes to the bathroom at night when she returns to bed  She had a terrible fall while trying to some decorating while standing on commode. She fell and hit her head. Since then she has struggled with dizziness visual disturbances and headaches.  She's also having cold sweats which she wonders whether to be attributed to her ProAmatine     Past Medical History:  Diagnosis Date  . ADHD (attention deficit hyperactivity disorder)   . Allergy    trees/pollen, mold, fungus, dust mites. Takes allergy shots  . Arthritis    PAIN AND OA LEFT HIP  . Asthma    allergist Dr Olena Heckle- monthly allergy injections  . Autonomic dysfunction    CENTRAL NERVOUS SYSTEM NEUROPATHY - DX BY DR. Erling Cruz MORE THAN 10 YRS AGO - AND IT IS FELT TO CONTRIBUTE TO THE AUTOMIC  DYSFUNCTION-- PT HAS NUMBNESS LEGS AND FEET AND SOMETIIMES TIPS OF FINGER, SEVERE CONSTIPATION( NO SENSATION TO HAVE BM ), DOUBLE VISION, ORTHOSTATIC HYPOTENSION  . Blood transfusion   . Breast cancer ([place])    right  side  . Bruises easily   . Cataract   . Chest pain    a. 12/2012 Cath: nl cors, EF 55-65%.  . CHF (congestive heart failure) ([place])   . Complication of anesthesia    reaction to some anesthetics/ 7/12 anesth record on chart- states prefers epidural  . Depression   . Diverticulosis   . Dysrhythmia    HX OF HIGH GRADE HEART BLOCK - REQUIRED PACEMAKER INSERTION  . Esophageal stricture   . Gastritis   . GERD (gastroesophageal reflux disease)   . H/O hiatal hernia   . Hemorrhoids   . Hernia of abdominal wall    spigelian hernia RLQ - SURGERY TO REPAIR  . Hypothyroidism   . Interstitial cystitis   . Latex allergy, contact dermatitis   . Mallory - Weiss tear    HEALED   . Neuromuscular disorder ([facility])    central nervous system neuropathy- seen per Dr Erling Cruz  . Pacemaker   . Pericardial effusion    a. 12/2012 following ppm placement;  b. 01/01/2013 Echo: EF 55-60%, small pericardial effusion w/o RV collapse-->No need for tap/window.  Marland Kitchen PONV (postoperative nausea and vomiting)    pt needs scop patch  . Recurrent upper respiratory infection (URI) 1/13- to present   bronchitis following surgery- states improved but still with cough. OV with [facility] Dr [name] 09/06/11 on chart  . Shortness of breath    AT TIMES - BUT  MUCH IMPROVED AFTER PACEMAKER WAS REPROGRAMED.  Marland Kitchen Symptomatic bradycardia    a. 12/2012 s/p MDT dual chamber PPM, ser # [id number]; b. 12/2012 post-op course complicated by pericardial effusinon req lead revision.  . Thyroid disease     Past Surgical History:  Procedure Laterality Date  . ABDOMINAL HYSTERECTOMY  1974  . BACK SURGERY     cervical fusion 4-5 with plate  . BLADDER SUSPENSION    . BREAST BIOPSY  2002   NO BLOOD PRESSURES ON RIGHT SIDE/   s/p  axillary node dissection  . CYSTO WITH HYDRODISTENSION  09/07/2011   Procedure: CYSTOSCOPY/HYDRODISTENSION;  Surgeon: Ailene Rud, MD;  Location: [facility];  Service: Urology;  Laterality: N/A;  INSTILLATION OF  MARCAINE/PYRIDIUM INSTILLATION OF MARCAINE/KENALOG  . CYSTOSCOPY  1975, 2007  . EYE SURGERY     LASIK EYE SURGERY BILATERAL  . LAPAROSCOPY  1973  . LEAD REVISION N/A 12/30/2012   Procedure: LEAD REVISION;  Surgeon: Evans Lance, MD;  Location[facility] CATH LAB;  Service: Cardiovascular;  Laterality: N/A;  . LEFT HEART CATHETERIZATION WITH CORONARY ANGIOGRAM N/A 12/29/2012   Procedure: LEFT HEART CATHETERIZATION WITH CORONARY ANGIOGRAM;  Surgeon: Peter M Martinique, MD;  Location: [facility] CATH LAB;  Service: Cardiovascular;  Laterality: N/A;  . MASTECTOMY MODIFIED RADICAL     right; with immediate reconstruction  . MYRINGOPLASTY  1962  . NECK SURGERY     c4-5 ruptured disc  . OOPHORECTOMY  1982  . PACEMAKER INSERTION    . PERMANENT PACEMAKER INSERTION N/A 12/29/2012   Procedure: PERMANENT PACEMAKER INSERTION;  Surgeon: Deboraha Sprang, MD;  Location: [facility] CATH LAB;  Service: Cardiovascular;  Laterality: N/A;  . RECTOCELE REPAIR     with cystocele repair  . TONSILLECTOMY    . TOTAL HIP ARTHROPLASTY Right   . TOTAL HIP ARTHROPLASTY Left 06/12/2013   Procedure: LEFT TOTAL HIP ARTHROPLASTY ANTERIOR APPROACH;  Surgeon: Mcarthur Rossetti, MD;  Location: [facility];  Service: Orthopedics;  Laterality: Left;  . TYMPANOPLASTY  1973   right  . ULNAR NERVE TRANSPOSITION Right   . VENTRAL HERNIA REPAIR  05/30/2011   Procedure: HERNIA REPAIR VENTRAL ADULT;  Surgeon: Haywood Lasso, MD;  Location: [facility];  Service: General;  Laterality: Right;  repair right spigelian hernia    Current Outpatient Prescriptions  Medication Sig Dispense Refill  . acetaminophen (TYLENOL) 325 MG tablet Take 2 tablets (650 mg total) by mouth every 6 (six) hours as needed for mild pain. 30 tablet 0  . albuterol (PROVENTIL HFA;VENTOLIN HFA) 108 (90 BASE) MCG/ACT inhaler Inhale 2 puffs into the lungs every 6 (six) hours as needed. For shortness of breath    . b complex vitamins capsule Take 1 capsule by mouth daily.      . cholecalciferol (VITAMIN D) 1000 UNITS tablet Take 1,000 Units by mouth daily.    . ciclesonide (OMNARIS) 50 MCG/ACT nasal spray Place 2 sprays into both nostrils as needed for allergies.     Marland Kitchen EPINEPHrine (EPIPEN 2-PAK) 0.3 mg/0.3 mL IJ SOAJ injection Inject 0.3 mLs (0.3 mg total) into the muscle once. 2 Device 3  . escitalopram (LEXAPRO) 10 MG tablet Take 10 mg by mouth daily.    Marland Kitchen esomeprazole (NEXIUM) 40 MG capsule Take 1 capsule (40 mg total) by mouth daily. 90 capsule 1  . estradiol (ESTRING) 2 MG vaginal ring Place 2 mg vaginally every 3 (three) months. follow package directions    . estradiol (VIVELLE-DOT) 0.025 MG/24HR Place  1 patch onto the skin 2 (two) times a week. Applies new patch on Sunday & Thursday    . fexofenadine (ALLEGRA) 180 MG tablet Take 180 mg by mouth daily.     Marland Kitchen KLOR-CON M10 10 MEQ tablet TAKE 1 TABLET DAILY 30 tablet 9  . levothyroxine (SYNTHROID, LEVOTHROID) 88 MCG tablet Take 100 mcg by mouth daily before breakfast.    . meclizine (ANTIVERT) 25 MG tablet Take 0.5 tablets (12.5 mg total) by mouth 3 (three) times daily. 45 tablet 1  . Melatonin 3 MG TABS Take 1 tablet by mouth at bedtime as needed.    . midodrine (PROAMATINE) 10 MG tablet Take 1 tablet (10 mg total) by mouth 3 (three) times daily with meals. 60 tablet 0  . mometasone-formoterol (DULERA) 200-5 MCG/ACT AERO Inhale 2 puffs into the lungs 2 (two) times daily as needed.     . montelukast (SINGULAIR) 10 MG tablet Take 10 mg by mouth as needed (allergies).     . ondansetron (ZOFRAN ODT) 4 MG disintegrating tablet Take 1 tablet (4 mg total) by mouth every 8 (eight) hours as needed for nausea or vomiting. 12 tablet 2  . PRESCRIPTION MEDICATION ALLERGY SHOT EVERY 2 WEEKS BY DR. Neldon Mc    . Probiotic Product (ALIGN) 4 MG CAPS Take 4 mg by mouth at bedtime.      No current facility-administered medications for this visit.    Facility-Administered Medications Ordered in Other Visits  Medication Dose Route  Frequency Provider Last Rate Last Dose  . bupivacaine (MARCAINE) 0.5 % 10 mL, triamcinolone acetonide (KENALOG-40) 40 mg injection   Subcutaneous Once Carolan Clines, MD      . bupivacaine (MARCAINE) 0.5 % 15 mL, phenazopyridine (PYRIDIUM) 400 mg bladder mixture   Bladder Instillation Once Carolan Clines, MD        Allergies  Allergen Reactions  . Anesthetics, [place] Other (See Comments)    Patient unsure of names, however multiples cause swelling of airway & nausea  . Clindamycin Swelling  . Neomycin Swelling  . Shellfish Allergy Other (See Comments)    Neurotoxic reaction  . Sulfonamide Derivatives Anaphylaxis and Swelling  . Zolpidem Tartrate Other (See Comments)    Jerking motions   . Azithromycin Other (See Comments)    Severe gastritis   . Bactroban Other (See Comments)    Causes sores in nose  . Ciprofloxacin     REACTION: joint swelling  . Codeine Swelling  . Meperidine Hcl Nausea Only    Hallucinations   . Morphine Nausea Only    Hallucinations   . Neosporin [Neomycin-Polymyxin-Gramicidin] Hives and Dermatitis    All topical "orin's ointment"  . Nitrofurantoin     REACTION: neuropathy in legs  . Other     ALLERGIC TO WARM WATER SHELLFISH  PT STATES PLEASE NOTE SHE CAN TAKE OXYCODONE AND TYLENOL FOR PAIN -NOT ALLERGIC  . Penicillins Other (See Comments)    Swelling in joints  . Tramadol     Makes jerk  . Latex Rash    Review of Systems negative except from HPI and PMH  Physical Exam There were no vitals taken for this visit. Well developed and well nourished in no acute distress HENT normal E scleral and icterus clear Neck Supple JVP flat; carotids brisk and full Clear to ausculation  Regular rate and rhythm, no murmurs gallops or rub Soft with active bowel sounds No clubbing cyanosis no Edema Alert and oriented, grossly normal motor and sensory function Skin Warm and  Dry  ECG demonstrates [place] P synchronous pacing  Assessment and   Plan  High-grade heart block -now complete heart block  Pacemaker-[employer]  Exercise associated hypotension  ? Dysautonomia  Nocturnal palpitationsWith used some more therapy.  Central polyneuropathy      She continues with orthostatic intolerance. The [facility] notes were reviewed and they had raised the possibility of compression clothing. I have suggested similar abdominal binder 90 and compressive clothing involving abdomen and thighs during the day.  Patient's concern regarding ProAmatine I think it best addressed by eliminating t gradually. If it can be implicated, it can be discontinued.. I have been in touch with Dr. Aleen Sells; she has used some nothera--potentially an alternative  Further room encouraged her to resume her exercise particularly recumbent exercise.  We discussed other strategies to try to prevent nocturnal lightheadedness with urination i.e. bedside commode with the head of the bed the abdominal binder as she will be following up with you we will see her in one year  More than 50% of 45 min was spent in counseling related to the above

## 2016-02-29 ENCOUNTER — Ambulatory Visit: Payer: Medicare Other | Attending: Neurology

## 2016-02-29 DIAGNOSIS — R2689 Other abnormalities of gait and mobility: Secondary | ICD-10-CM | POA: Diagnosis not present

## 2016-02-29 DIAGNOSIS — R42 Dizziness and giddiness: Secondary | ICD-10-CM | POA: Insufficient documentation

## 2016-02-29 NOTE — Therapy (Signed)
[facility] [address] [place], [place], [address] Phone: [phone]   Fax:  [phone]  Physical Therapy Evaluation  Patient Details  Name: [name] MRN: [id number] Date of Birth: [birth year] Referring Provider: Dr. Jannifer Franklin  Encounter Date: 02/29/2016      PT End of Session - 02/29/16 1331    Visit Number 1   Number of Visits 9   Date for PT Re-Evaluation 03/30/16   Authorization Type G-CODE AND PROGRESS NOTE EVERY 10TH VISIT. [employer]-NO PTAS   PT Start Time [phone]   PT Stop Time 1015   PT Time Calculation (min) 44 min   Equipment Utilized During Treatment --  min guard to S prn   Activity Tolerance Patient tolerated treatment well   Behavior During Therapy WFL for tasks assessed/performed      Past Medical History:  Diagnosis Date  . ADHD (attention deficit hyperactivity disorder)   . Allergy    trees/pollen, mold, fungus, dust mites. Takes allergy shots  . Arthritis    PAIN AND OA LEFT HIP  . Asthma    allergist Dr Olena Heckle- monthly allergy injections  . Autonomic dysfunction    CENTRAL NERVOUS SYSTEM NEUROPATHY - DX BY DR. Erling Cruz MORE THAN 10 YRS AGO - AND IT IS FELT TO CONTRIBUTE TO THE AUTOMIC  DYSFUNCTION-- PT HAS NUMBNESS LEGS AND FEET AND SOMETIIMES TIPS OF FINGER, SEVERE CONSTIPATION( NO SENSATION TO HAVE BM ), DOUBLE VISION, ORTHOSTATIC HYPOTENSION  . Blood transfusion   . Breast cancer ([place])    right side  . Bruises easily   . Cataract   . Chest pain    a. 12/2012 Cath: nl cors, EF 55-65%.  . CHF (congestive heart failure) ([place])   . Complication of anesthesia    reaction to some anesthetics/ 7/12 anesth record on chart- states prefers epidural  . Depression   . Diverticulosis   . Dysrhythmia    HX OF HIGH GRADE HEART BLOCK - REQUIRED PACEMAKER INSERTION  . Esophageal stricture   . Gastritis   . GERD (gastroesophageal reflux disease)   . H/O hiatal hernia   . Hemorrhoids   .  Hernia of abdominal wall    spigelian hernia RLQ - SURGERY TO REPAIR  . Hypothyroidism   . Interstitial cystitis   . Latex allergy, contact dermatitis   . Mallory - Weiss tear    HEALED   . Neuromuscular disorder (HCC)    central nervous system neuropathy- seen per Dr Erling Cruz  . Pacemaker   . Pericardial effusion    a. 12/2012 following ppm placement;  b. 01/01/2013 Echo: EF 55-60%, small pericardial effusion w/o RV collapse-->No need for tap/window.  Marland Kitchen PONV (postoperative nausea and vomiting)    pt needs scop patch  . Recurrent upper respiratory infection (URI) 1/13- to present   bronchitis following surgery- states improved but still with cough. OV with Clearance Dr [name] 09/06/11 on chart  . Shortness of breath    AT TIMES - BUT MUCH IMPROVED AFTER PACEMAKER WAS REPROGRAMED.  Marland Kitchen Symptomatic bradycardia    a. 12/2012 s/p MDT dual chamber PPM, ser # [id number]; b. 12/2012 post-op course complicated by pericardial effusinon req lead revision.  . Thyroid disease     Past Surgical History:  Procedure Laterality Date  . ABDOMINAL HYSTERECTOMY  1974  . BACK SURGERY     cervical fusion 4-5 with plate  . BLADDER SUSPENSION    . BREAST BIOPSY  2002   NO BLOOD PRESSURES  ON RIGHT SIDE/   s/p  axillary node dissection  . CYSTO WITH HYDRODISTENSION  09/07/2011   Procedure: CYSTOSCOPY/HYDRODISTENSION;  Surgeon: Ailene Rud, MD;  Location: [facility];  Service: Urology;  Laterality: N/A;  INSTILLATION OF MARCAINE/PYRIDIUM INSTILLATION OF MARCAINE/KENALOG  . CYSTOSCOPY  1975, 2007  . EYE SURGERY     LASIK EYE SURGERY BILATERAL  . LAPAROSCOPY  1973  . LEAD REVISION N/A 12/30/2012   Procedure: LEAD REVISION;  Surgeon: Evans Lance, MD;  Location: [facility] CATH LAB;  Service: Cardiovascular;  Laterality: N/A;  . LEFT HEART CATHETERIZATION WITH CORONARY ANGIOGRAM N/A 12/29/2012   Procedure: LEFT HEART CATHETERIZATION WITH CORONARY ANGIOGRAM;  Surgeon: Peter M Martinique, MD;  Location: [facility] CATH LAB;   Service: Cardiovascular;  Laterality: N/A;  . MASTECTOMY MODIFIED RADICAL     right; with immediate reconstruction  . MYRINGOPLASTY  1962  . NECK SURGERY     c4-5 ruptured disc  . OOPHORECTOMY  1982  . PACEMAKER INSERTION    . PERMANENT PACEMAKER INSERTION N/A 12/29/2012   Procedure: PERMANENT PACEMAKER INSERTION;  Surgeon: Deboraha Sprang, MD;  Location: [facility] CATH LAB;  Service: Cardiovascular;  Laterality: N/A;  . RECTOCELE REPAIR     with cystocele repair  . TONSILLECTOMY    . TOTAL HIP ARTHROPLASTY Right   . TOTAL HIP ARTHROPLASTY Left 06/12/2013   Procedure: LEFT TOTAL HIP ARTHROPLASTY ANTERIOR APPROACH;  Surgeon: Mcarthur Rossetti, MD;  Location: [facility];  Service: Orthopedics;  Laterality: Left;  . TYMPANOPLASTY  1973   right  . ULNAR NERVE TRANSPOSITION Right   . VENTRAL HERNIA REPAIR  05/30/2011   Procedure: HERNIA REPAIR VENTRAL ADULT;  Surgeon: Haywood Lasso, MD;  Location: [facility];  Service: General;  Laterality: Right;  repair right spigelian hernia    There were no vitals filed for this visit.       Subjective Assessment - 02/29/16 0943    Subjective Pt reports decr. mobility since falling in 10/2015. Pt reported she fell and hit her head and had PT for her back and knee. She is no longer seeing OP ortho PT. She then began to experience dizziness approx. 2 weeks after fall. Pt reports this dizziness/imbalance doesn't feel like positional vertigo but explains the dizziness as "things are moving". Pt reported she had B cataracts removed this year.   Pertinent History Latex allergy, Pacemaker, CHF, orthostatic hypotension, second degree heart block, migraine, one time occcipital neuralgia (L-side), osteopenia, hypothyroidism, neuropathy, arthritis, hx of B THA (ant. approach), asthma, hx of Breast CA, hx of vertigo   Patient Stated Goals Get rid of this dizziness and feel comfortable doing different activities. "I want to get back to being me."    Currently in Pain? No/denies            [facility] PT Assessment - 02/29/16 0946      Assessment   Medical Diagnosis BPPV   Referring Provider Dr. Jannifer Franklin   Onset Date/Surgical Date 11/07/15  s/p fall   Hand Dominance Right   Prior Therapy previous OPPT neuro for dizziness last year and OPPT ortho for neck/knee in 2017     Precautions   Precautions Fall     Restrictions   Weight Bearing Restrictions No     Balance Screen   Has the patient fallen in the past 6 months Yes   How many times? 1   Has the patient had a decrease in activity level because of a fear of falling?  Yes   Is the patient reluctant to leave their home because of a fear of falling?  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     Sensation   Additional Comments Pt has hx of hypersensitivity in R toes at night and decr. light touch during the day.      Ambulation/Gait   Ambulation/Gait Yes   Ambulation/Gait Assistance 5: Supervision;4: Min guard   Ambulation/Gait Assistance Details Pt amb. in guarded manner and did not perform head turns/nods 2/2 fear of provoking  dizziness/unsteadiness. Min guard during turns and during first 5' of amb. 2/2 pt c/o unsteadiness and wooziness upon standing.   Ambulation Distance (Feet) 50 Feet   Assistive device None   Gait Pattern Step-through pattern;Decreased stride length;Decreased arm swing - right;Decreased arm swing - left;Decreased trunk rotation;Decreased hip/knee flexion - right;Decreased hip/knee flexion - left   Ambulation Surface Level;Indoor   Gait velocity 2.13f/sec.             Vestibular Assessment - 02/29/16 0956      Vestibular Assessment   General Observation Pt reports dizziness incr. when turning head quickly and while looking over balcony (overlooks first floor) in home.  During positional testing, pt did not experience dizziness or nystagmus. However, PT noted pt's eyes appeared to move in a similar manner to ocular flutter, very small amplitude.      Symptom Behavior   Type of Dizziness Imbalance  and "things moving but not spinning"   Frequency of Dizziness Every day   Duration of Dizziness Constant dizziness, at best: 0/10, on average: 7/10 at worst: 10/10 on night a few weeks ago   Aggravating Factors Turning head quickly   Relieving Factors Rest;Lying supine  meclizine helps reduce nausea per pt     Occulomotor Exam   Occulomotor Alignment Normal   Spontaneous Absent   Gaze-induced Absent   Smooth Pursuits Intact   Saccades Slow  L side slow, R side WNL   Comment Pt reported she "didn't feel good" during smooth pursuits. Pt had difficulty performing convergence 2/2 double vision which pt reports she experiences until the object is 6' in from pt. Pt reports double vision began years ago and MD is aware (15 years ago). Pt reports a flicker during L sided saccades     Vestibulo-Occular Reflex   VOR 1 Head Only (x 1 viewing) Performed in limited range as pt had difficulty performing 2/2 neck pain, and fear of dizziness. Pt reports incr. dizziness after performing VOR.    VOR  Cancellation Normal  Positional Testing   Dix-Hallpike Dix-Hallpike Right;Dix-Hallpike Left   Horizontal Canal Testing Horizontal Canal Right;Horizontal Canal Left     Dix-Hallpike Right   Dix-Hallpike Right Duration none   Dix-Hallpike Right Symptoms Other (comment);No nystagmus  see general observations above.     Dix-Hallpike Left   Dix-Hallpike Left Duration none   Dix-Hallpike Left Symptoms No nystagmus;Other (comment)  see detail in general observations     Horizontal Canal Right   Horizontal Canal Right Duration none   Horizontal Canal Right Symptoms Normal;Other (comment)  see general observations above     Horizontal Canal Left   Horizontal Canal Left Duration none   Horizontal Canal Left Symptoms Normal;Other (comment)  see general observations above                       PT Education - 02/29/16 1331    Education provided Yes   Education Details PT discussed duration/frequency and outcome measures.    Person(s) Educated Patient   Methods Explanation   Comprehension Verbalized understanding          PT Short Term Goals - 02/29/16 1344      PT SHORT TERM GOAL #1   Title same as LTGs           PT Long Term Goals - 02/29/16 1344      PT LONG TERM GOAL #1   Title Pt will be IND In HEP to improve dizziness, balance, and gait. TARGET DATE FOR ALL LTGS: 03/28/16   Status New     PT LONG TERM GOAL #2   Title Perform FGA and write goal as indicated.   Status New     PT LONG TERM GOAL #3   Title Pt will report average dizziness will be </=3/10 in order to perform ADLs in safe manner.    Status New     PT LONG TERM GOAL #4   Title Pt will amb. 1000' over even/uneven terrain, IND, while performing head turns without dizziness incr. >/=1 point in order to improve functional mobility and safely garden.    Status New     PT LONG TERM GOAL #5   Title Pt will improve DHI score from 78% to 60% to improve quality of life.   Status New                Plan - 02/29/16 1332    Clinical Impression Statement Pt is a pleasant 70y/o female presenting to OPPT neuro for dizziness. Pt is familiar to this clinic, as she was seen here in 2016 for balance impairments. Pt's current episode of dizziness began s/p fall in 10/2015 and has not resolved. Pt's PMH significant for the following: Latex allergy, Pacemaker, CHF, orthostatic hypotension, second degree heart block, migraine, one time occcipital neuralgia (L-side), osteopenia, hypothyroidism, neuropathy, arthritis, hx of B THA (ant. approach), asthma, hx of Breast CA, hx of vertigo. The following deficits were noted during exam: gait deviations, dizziness, impaired balance, and visual impairments (pt with hx of double vision out to 6'). Pt's exam findings consistent with likely vestibular hypofunction. Positional testing was negative for nystagmus and dizziness. However, pt noted to experienced eye movement similar to ocular flutter but with very small amplitude and no c/o dizziness. If sx's do not improve within 6  Sessions, PT will refer back to MD. PT will continue to monitor. PT will perform FGA next session to evaluate balance during dynamic gait activities. Pt's DHI score indicates pt perceives  dizziness has a severe impact on functional abilities. Pt would benefit from skilled PT to improve safety during functional mobility.    Rehab Potential Good   Clinical Impairments Affecting Rehab Potential Please see PMH   PT Frequency 2x / week   PT Duration 4 weeks   PT Treatment/Interventions ADLs/Self Care Home Management;Biofeedback;Canalith Repostioning;Neuromuscular re-education;Balance training;Therapeutic exercise;Therapeutic activities;Functional mobility training;Manual techniques;Stair training;Gait training;DME Instruction;Patient/family education;Orthotic Fit/Training;Vestibular   PT Next Visit Plan Perform FGA and write goal as indicated, initiate gaze stabilization and balance  HEP. Assess for ocular flutter.   Consulted and Agree with Plan of Care Patient      Patient will benefit from skilled therapeutic intervention in order to improve the following deficits and impairments:  Abnormal gait, Dizziness, Decreased mobility, Decreased balance, Impaired vision/preception, Decreased knowledge of use of DME  Visit Diagnosis: Dizziness and giddiness - Plan: PT plan of care cert/re-cert  Other abnormalities of gait and mobility - Plan: PT plan of care cert/re-cert      G-Codes - 96/04/54 1347    Functional Assessment Tool Used DHI: 78%   Functional Limitation Self care   Self Care Current Status (U9811) At least 80 percent but less than 100 percent impaired, limited or restricted   Self Care Goal Status (B1478) At least 1 percent but less than 20 percent impaired, limited or restricted       Problem List Patient Active Problem List   Diagnosis Date Noted  . Dizziness and giddiness 02/16/2016  . Monoallelic mutation of CHEK2 gene 02/02/2015  . Genetic testing 01/31/2015  . Altered mental status 10/25/2014  . Occipital neuralgia of left side   . Stroke ([place]) 10/07/2014  . History of permanent cardiac pacemaker placement 10/07/2014  . Cephalalgia   . Complicated migraine   . HLD (hyperlipidemia)   . Headache 10/06/2014  . Arthritis of left hip 06/12/2013  . Status post THR (total hip replacement) 06/12/2013  . Constipation 05/25/2013  . Orthostatic hypotension 01/27/2013  . crosstalk-atrial lead 01/27/2013  . Pre-syncope 01/02/2013  . Sinus tachycardia 01/01/2013  . Second degree heart block 12/27/2012  . Hypothyroidism 12/27/2012  . Spigelian hernia 04/20/2011  . DYSPNEA 05/09/2010  . BREAST CANCER 05/05/2010  . ASTHMA 05/05/2010  . HIATAL HERNIA 05/05/2010  . DEGENERATIVE Disk DISEASE 05/05/2010  . OSTEOPENIA 05/05/2010    Adryan Druckenmiller L 02/29/2016, 1:49 PM  [facility] [address] [place], [place], [address] Phone: [phone]   Fax:  [phone]  Name: [name] MRN: [id number] Date of Birth: [birth year]  Geoffry Paradise, PT,DPT 02/29/16 1:50 PM Phone: [phone] Fax: [phone]

## 2016-03-01 ENCOUNTER — Ambulatory Visit (INDEPENDENT_AMBULATORY_CARE_PROVIDER_SITE_OTHER): Payer: Medicare Other | Admitting: *Deleted

## 2016-03-01 DIAGNOSIS — J309 Allergic rhinitis, unspecified: Secondary | ICD-10-CM

## 2016-03-05 ENCOUNTER — Encounter (INDEPENDENT_AMBULATORY_CARE_PROVIDER_SITE_OTHER): Payer: Self-pay | Admitting: Orthopaedic Surgery

## 2016-03-05 ENCOUNTER — Ambulatory Visit (INDEPENDENT_AMBULATORY_CARE_PROVIDER_SITE_OTHER): Payer: Medicare Other | Admitting: Orthopaedic Surgery

## 2016-03-05 DIAGNOSIS — M25562 Pain in left knee: Secondary | ICD-10-CM | POA: Diagnosis not present

## 2016-03-05 DIAGNOSIS — G8929 Other chronic pain: Secondary | ICD-10-CM | POA: Diagnosis not present

## 2016-03-05 NOTE — Progress Notes (Signed)
Office Visit Note   Patient: [name]           Date of Birth: [birth year]           MRN: [id number] Visit Date: 03/05/2016              Requested by: Burnard Bunting, MD [place], [place] [address] PCP: Geoffery Lyons, MD   Assessment & Plan: Visit Diagnoses:  1. Chronic pain of left knee     Plan: follow-up prn  Follow-Up Instructions: Return if symptoms worsen or fail to improve.   Orders:  No orders of the defined types were placed in this encounter.  No orders of the defined types were placed in this encounter.     Procedures: No procedures performed   Clinical Data: No additional findings.   Subjective: Chief Complaint  Patient presents with  . Left Knee - Follow-up    States left knee is doing well after Monovisc injection 01/04/16 also doing accupuncture at [facility] Pt. States back is also doing better.  Knne has done great post monovisc injection Therapy is going great  HPI  Review of Systems   Objective: Vital Signs: There were no vitals taken for this visit.  Physical Exam  Ortho Exam Normal left knee exam Specialty Comments:  No specialty comments available.  Imaging: No results found.   PMFS History: Patient Active Problem List   Diagnosis Date Noted  . Dizziness and giddiness 02/16/2016  . Monoallelic mutation of CHEK2 gene 02/02/2015  . Genetic testing 01/31/2015  . Altered mental status 10/25/2014  . Occipital neuralgia of left side   . Stroke ([place]) 10/07/2014  . History of permanent cardiac pacemaker placement 10/07/2014  . Cephalalgia   . Complicated migraine   . HLD (hyperlipidemia)   . Headache 10/06/2014  . Arthritis of left hip 06/12/2013  . Status post THR (total hip replacement) 06/12/2013  . Constipation 05/25/2013  . Orthostatic hypotension 01/27/2013  . crosstalk-atrial lead 01/27/2013  . Pre-syncope 01/02/2013  . Sinus tachycardia 01/01/2013  . Second degree heart block 12/27/2012  .  Hypothyroidism 12/27/2012  . Spigelian hernia 04/20/2011  . DYSPNEA 05/09/2010  . BREAST CANCER 05/05/2010  . ASTHMA 05/05/2010  . HIATAL HERNIA 05/05/2010  . DEGENERATIVE Disk DISEASE 05/05/2010  . OSTEOPENIA 05/05/2010   Past Medical History:  Diagnosis Date  . ADHD (attention deficit hyperactivity disorder)   . Allergy    trees/pollen, mold, fungus, dust mites. Takes allergy shots  . Arthritis    PAIN AND OA LEFT HIP  . Asthma    allergist Dr Olena Heckle- monthly allergy injections  . Autonomic dysfunction    CENTRAL NERVOUS SYSTEM NEUROPATHY - DX BY DR. Erling Cruz MORE THAN 10 YRS AGO - AND IT IS FELT TO CONTRIBUTE TO THE AUTOMIC  DYSFUNCTION-- PT HAS NUMBNESS LEGS AND FEET AND SOMETIIMES TIPS OF FINGER, SEVERE CONSTIPATION( NO SENSATION TO HAVE BM ), DOUBLE VISION, ORTHOSTATIC HYPOTENSION  . Blood transfusion   . Breast cancer ([place])    right side  . Bruises easily   . Cataract   . Chest pain    a. 12/2012 Cath: nl cors, EF 55-65%.  . CHF (congestive heart failure) ([place])   . Complication of anesthesia    reaction to some anesthetics/ 7/12 anesth record on chart- states prefers epidural  . Depression   . Diverticulosis   . Dysrhythmia    HX OF HIGH GRADE HEART BLOCK - REQUIRED PACEMAKER INSERTION  . Esophageal stricture   .  Gastritis   . GERD (gastroesophageal reflux disease)   . H/O hiatal hernia   . Hemorrhoids   . Hernia of abdominal wall    spigelian hernia RLQ - SURGERY TO REPAIR  . Hypothyroidism   . Interstitial cystitis   . Latex allergy, contact dermatitis   . Mallory - Weiss tear    HEALED   . Neuromuscular disorder (HCC)    central nervous system neuropathy- seen per Dr Erling Cruz  . Pacemaker   . Pericardial effusion    a. 12/2012 following ppm placement;  b. 01/01/2013 Echo: EF 55-60%, small pericardial effusion w/o RV collapse-->No need for tap/window.  Marland Kitchen PONV (postoperative nausea and vomiting)    pt needs scop patch  . Recurrent upper respiratory infection (URI)  1/13- to present   bronchitis following surgery- states improved but still with cough. OV with Clear[facility] Dr [name] 09/06/11 on chart  . Shortness of breath    AT TIMES - BUT MUCH IMPROVED AFTER PACEMAKER WAS REPROGRAMED.  Marland Kitchen Symptomatic bradycardia    a. 12/2012 s/p MDT dual chamber PPM, ser # [id number]; b. 12/2012 post-op course complicated by pericardial effusinon req lead revision.  . Thyroid disease     Family History  Problem Relation Age of Onset  . Heart disease Father   . Colon cancer Mother 72  . Depression Mother   . Pancreatic cancer Sister 71  . Diverticulitis Sister   . Uterine cancer Maternal Grandmother   . Heart disease Brother   . Heart disease Paternal Grandmother   . Heart disease Paternal Grandfather   . Throat cancer Maternal Uncle   . Heart disease Paternal Aunt   . Heart disease Paternal Uncle   . Heart disease Brother   . Thrombosis Maternal Aunt   . Cancer Maternal Uncle     NOS  . Diabetes      aunt    Past Surgical History:  Procedure Laterality Date  . ABDOMINAL HYSTERECTOMY  1974  . BACK SURGERY     cervical fusion 4-5 with plate  . BLADDER SUSPENSION    . BREAST BIOPSY  2002   NO BLOOD PRESSURES ON RIGHT SIDE/   s/p  axillary node dissection  . CYSTO WITH HYDRODISTENSION  09/07/2011   Procedure: CYSTOSCOPY/HYDRODISTENSION;  Surgeon: Ailene Rud, MD;  Location[facility];  Service: Urology;  Laterality: N/A;  INSTILLATION OF MARCAINE/PYRIDIUM INSTILLATION OF MARCAINE/KENALOG  . CYSTOSCOPY  1975, 2007  . EYE SURGERY     LASIK EYE SURGERY BILATERAL  . LAPAROSCOPY  1973  . LEAD REVISION N/A 12/30/2012   Procedure: LEAD REVISION;  Surgeon: Evans Lance, MD;  Location: [facility] CATH LAB;  Service: Cardiovascular;  Laterality: N/A;  . LEFT HEART CATHETERIZATION WITH CORONARY ANGIOGRAM N/A 12/29/2012   Procedure: LEFT HEART CATHETERIZATION WITH CORONARY ANGIOGRAM;  Surgeon: Peter M Martinique, MD;  Location: [facility] CATH LAB;  Service: Cardiovascular;   Laterality: N/A;  . MASTECTOMY MODIFIED RADICAL     right; with immediate reconstruction  . MYRINGOPLASTY  1962  . NECK SURGERY     c4-5 ruptured disc  . OOPHORECTOMY  1982  . PACEMAKER INSERTION    . PERMANENT PACEMAKER INSERTION N/A 12/29/2012   Procedure: PERMANENT PACEMAKER INSERTION;  Surgeon: Deboraha Sprang, MD;  Location: [facility] CATH LAB;  Service: Cardiovascular;  Laterality: N/A;  . RECTOCELE REPAIR     with cystocele repair  . TONSILLECTOMY    . TOTAL HIP ARTHROPLASTY Right   . TOTAL HIP ARTHROPLASTY Left 06/12/2013  Procedure: LEFT TOTAL HIP ARTHROPLASTY ANTERIOR APPROACH;  Surgeon: Mcarthur Rossetti, MD;  Location: [facility];  Service: Orthopedics;  Laterality: Left;  . TYMPANOPLASTY  1973   right  . ULNAR NERVE TRANSPOSITION Right   . VENTRAL HERNIA REPAIR  05/30/2011   Procedure: HERNIA REPAIR VENTRAL ADULT;  Surgeon: Haywood Lasso, MD;  Location: [employer];  Service: General;  Laterality: Right;  repair right spigelian hernia   Social History   Occupational History  . Retired Engineer, manufacturing    [employer]  .  [employer]   Social History Main Topics  . Smoking status: Never Smoker  . Smokeless tobacco: Never Used  . Alcohol use 4.2 oz/week    7 Glasses of wine per week     Comment: occasional  . Drug use: No  . Sexual activity: Not on file

## 2016-03-12 ENCOUNTER — Ambulatory Visit (INDEPENDENT_AMBULATORY_CARE_PROVIDER_SITE_OTHER): Payer: Medicare Other

## 2016-03-12 DIAGNOSIS — J309 Allergic rhinitis, unspecified: Secondary | ICD-10-CM

## 2016-03-14 ENCOUNTER — Ambulatory Visit: Payer: Medicare Other | Attending: Neurology | Admitting: Rehabilitative and Restorative Service Providers"

## 2016-03-14 DIAGNOSIS — R2689 Other abnormalities of gait and mobility: Secondary | ICD-10-CM

## 2016-03-14 DIAGNOSIS — R42 Dizziness and giddiness: Secondary | ICD-10-CM | POA: Diagnosis not present

## 2016-03-14 NOTE — Therapy (Signed)
[facility] [address] [place], [place], [address] Phone: [phone]   Fax:  [phone]  Physical Therapy Treatment  Patient Details  Name: [name] MRN: [id number] Date of Birth: 02/18/1946 Referring Provider: Dr. Jannifer Franklin  Encounter Date: 03/14/2016      PT End of Session - 03/14/16 0902    Visit Number 2   Number of Visits 9   Date for PT Re-Evaluation 03/30/16   Authorization Type G-CODE AND PROGRESS NOTE EVERY 10TH VISIT. [employer]-NO PTAS   PT Start Time 0900   PT Stop Time 0945   PT Time Calculation (min) 45 min   Equipment Utilized During Treatment --   Activity Tolerance Patient tolerated treatment well   Behavior During Therapy WFL for tasks assessed/performed      Past Medical History:  Diagnosis Date  . ADHD (attention deficit hyperactivity disorder)   . Allergy    trees/pollen, mold, fungus, dust mites. Takes allergy shots  . Arthritis    PAIN AND OA LEFT HIP  . Asthma    allergist Dr Olena Heckle- monthly allergy injections  . Autonomic dysfunction    CENTRAL NERVOUS SYSTEM NEUROPATHY - DX BY DR. Erling Cruz MORE THAN 10 YRS AGO - AND IT IS FELT TO CONTRIBUTE TO THE AUTOMIC  DYSFUNCTION-- PT HAS NUMBNESS LEGS AND FEET AND SOMETIIMES TIPS OF FINGER, SEVERE CONSTIPATION( NO SENSATION TO HAVE BM ), DOUBLE VISION, ORTHOSTATIC HYPOTENSION  . Blood transfusion   . Breast cancer ([place])    right side  . Bruises easily   . Cataract   . Chest pain    a. 12/2012 Cath: nl cors, EF 55-65%.  . CHF (congestive heart failure) ([place])   . Complication of anesthesia    reaction to some anesthetics/ 7/12 anesth record on chart- states prefers epidural  . Depression   . Diverticulosis   . Dysrhythmia    HX OF HIGH GRADE HEART BLOCK - REQUIRED PACEMAKER INSERTION  . Esophageal stricture   . Gastritis   . GERD (gastroesophageal reflux disease)   . H/O hiatal hernia   . Hemorrhoids   . Hernia of abdominal wall     spigelian hernia RLQ - SURGERY TO REPAIR  . Hypothyroidism   . Interstitial cystitis   . Latex allergy, contact dermatitis   . Mallory - Weiss tear    HEALED   . Neuromuscular disorder ([facility])    central nervous system neuropathy- seen per Dr Erling Cruz  . Pacemaker   . Pericardial effusion    a. 12/2012 following ppm placement;  b. 01/01/2013 Echo: EF 55-60%, small pericardial effusion w/o RV collapse-->No need for tap/window.  Marland Kitchen PONV (postoperative nausea and vomiting)    pt needs scop patch  . Recurrent upper respiratory infection (URI) 1/13- to present   bronchitis following surgery- states improved but still with cough. OV with [facility] Dr [name] 09/06/11 on chart  . Shortness of breath    AT TIMES - BUT MUCH IMPROVED AFTER PACEMAKER WAS REPROGRAMED.  Marland Kitchen Symptomatic bradycardia    a. 12/2012 s/p MDT dual chamber PPM, ser # [id number]; b. 12/2012 post-op course complicated by pericardial effusinon req lead revision.  . Thyroid disease     Past Surgical History:  Procedure Laterality Date  . ABDOMINAL HYSTERECTOMY  1974  . BACK SURGERY     cervical fusion 4-5 with plate  . BLADDER SUSPENSION    . BREAST BIOPSY  2002   NO BLOOD PRESSURES ON RIGHT SIDE/   s/p  axillary node dissection  . CYSTO WITH HYDRODISTENSION  09/07/2011   Procedure: CYSTOSCOPY/HYDRODISTENSION;  Surgeon: Ailene Rud, MD;  Location: [facility];  Service: Urology;  Laterality: N/A;  INSTILLATION OF MARCAINE/PYRIDIUM INSTILLATION OF MARCAINE/KENALOG  . CYSTOSCOPY  1975, 2007  . EYE SURGERY     LASIK EYE SURGERY BILATERAL  . LAPAROSCOPY  1973  . LEAD REVISION N/A 12/30/2012   Procedure: LEAD REVISION;  Surgeon: Evans Lance, MD;  Location: [facility] CATH LAB;  Service: Cardiovascular;  Laterality: N/A;  . LEFT HEART CATHETERIZATION WITH CORONARY ANGIOGRAM N/A 12/29/2012   Procedure: LEFT HEART CATHETERIZATION WITH CORONARY ANGIOGRAM;  Surgeon: Peter M Martinique, MD;  Location: [facility] CATH LAB;  Service: Cardiovascular;   Laterality: N/A;  . MASTECTOMY MODIFIED RADICAL     right; with immediate reconstruction  . MYRINGOPLASTY  1962  . NECK SURGERY     c4-5 ruptured disc  . OOPHORECTOMY  1982  . PACEMAKER INSERTION    . PERMANENT PACEMAKER INSERTION N/A 12/29/2012   Procedure: PERMANENT PACEMAKER INSERTION;  Surgeon: Deboraha Sprang, MD;  Location: [facility] CATH LAB;  Service: Cardiovascular;  Laterality: N/A;  . RECTOCELE REPAIR     with cystocele repair  . TONSILLECTOMY    . TOTAL HIP ARTHROPLASTY Right   . TOTAL HIP ARTHROPLASTY Left 06/12/2013   Procedure: LEFT TOTAL HIP ARTHROPLASTY ANTERIOR APPROACH;  Surgeon: Mcarthur Rossetti, MD;  Location: [facility];  Service: Orthopedics;  Laterality: Left;  . TYMPANOPLASTY  1973   right  . ULNAR NERVE TRANSPOSITION Right   . VENTRAL HERNIA REPAIR  05/30/2011   Procedure: HERNIA REPAIR VENTRAL ADULT;  Surgeon: Haywood Lasso, MD;  Location: [facility];  Service: General;  Laterality: Right;  repair right spigelian hernia    There were no vitals filed for this visit.      Subjective Assessment - 03/14/16 0901    Subjective The patient reports that she has been taking meclizine and it appears to be helping.  She feels symptoms have improved slightly since evaluation.  She is taking meds 2x/day (instead of 3x/day).  Patient notes looking far to near (convergence) is a challenging task.   Pertinent History Latex allergy, Pacemaker, CHF, orthostatic hypotension, second degree heart block, migraine, one time occcipital neuralgia (L-side), osteopenia, hypothyroidism, neuropathy, arthritis, hx of B THA (ant. approach), asthma, hx of Breast CA, hx of vertigo   Patient Stated Goals Get rid of this dizziness and feel comfortable doing different activities. "I want to get back to being me."   Currently in Pain? No/denies            [facility] PT Assessment - 03/14/16 0904      Functional Gait  Assessment   Gait assessed  Yes   Gait Level Surface Walks 20  ft in less than 7 sec but greater than 5.5 sec, uses assistive device, slower speed, mild gait deviations, or deviates 6-10 in outside of the 12 in walkway width.  6.09 seconds   Change in Gait Speed Able to change speed, demonstrates mild gait deviations, deviates 6-10 in outside of the 12 in walkway width, or no gait deviations, unable to achieve a major change in velocity, or uses a change in velocity, or uses an assistive device.   Gait with Horizontal Head Turns Performs head turns with moderate changes in gait velocity, slows down, deviates 10-15 in outside 12 in walkway width but recovers, can continue to walk.   Gait with Vertical Head Turns Performs  task with moderate change in gait velocity, slows down, deviates 10-15 in outside 12 in walkway width but recovers, can continue to walk.   Gait and Pivot Turn Turns slowly, requires verbal cueing, or requires several small steps to catch balance following turn and stop   Step Over Obstacle Is able to step over one shoe box (4.5 in total height) but must slow down and adjust steps to clear box safely. May require verbal cueing.   Gait with Narrow Base of Support Ambulates 7-9 steps.   Gait with Eyes Closed Walks 20 ft, slow speed, abnormal gait pattern, evidence for imbalance, deviates 10-15 in outside 12 in walkway width. Requires more than 9 sec to ambulate 20 ft.   Ambulating Backwards Walks 20 ft, uses assistive device, slower speed, mild gait deviations, deviates 6-10 in outside 12 in walkway width.   Steps Alternating feet, must use rail.   Total Score 15   FGA comment: 15/30 on FGA                     [facility] Adult PT Treatment/Exercise - 03/14/16 7824      Neuro Re-ed    Neuro Re-ed Details  Standing in corner for safety with horizontal and vertical head motion x 5 reps each due to noted nausea/motion sensitivity with activities.           Vestibular Treatment/Exercise - 03/14/16 0948      Vestibular Treatment/Exercise    Vestibular Treatment Provided Habituation;Gaze   Habituation Exercises Standing Horizontal Head Turns;Standing Vertical Head Turns   Gaze Exercises X1 Viewing Horizontal;Eye/Head Exercise Horizontal     Standing Horizontal Head Turns   Number of Reps  5   Symptom Description  with nausea     Standing Vertical Head Turns   Number of Reps  5   Symptom Description  does not provoke symptoms     X1 Viewing Horizontal   Foot Position standing feet apart at 5 feet due to double vision when closer   Comments with verbal, demo cues for head motion separate from shoulder movement  and cues to keep eyes focused on target     Eye/Head Exercise Horizontal   Foot Position seated   Comments eyes/head compensatory saccades performed with nausea noted               PT Education - 03/14/16 0945    Education provided Yes   Education Details HEP:  gaze x 1 adapation standing @ 5 feet, saccades/compensatory eye motion at 5-7 feet, standing horizontal head turns.   Person(s) Educated Patient   Methods Explanation;Demonstration;Handout   Comprehension Verbalized understanding;Returned demonstration          PT Short Term Goals - 02/29/16 1344      PT SHORT TERM GOAL #1   Title same as LTGs           PT Long Term Goals - 03/14/16 0946      PT LONG TERM GOAL #1   Title Pt will be IND In HEP to improve dizziness, balance, and gait. TARGET DATE FOR ALL LTGS: 03/28/16   Status On-going     PT LONG TERM GOAL #2   Title Perform FGA and write goal as indicated.   Baseline Scored 15/30 on FGA on 03/14/2016   Status Achieved     PT LONG TERM GOAL #3   Title Pt will report average dizziness will be </=3/10 in order to perform ADLs in safe manner.  Status On-going     PT LONG TERM GOAL #4   Title Pt will amb. 1000' over even/uneven terrain, IND, while performing head turns without dizziness incr. >/=1 point in order to improve functional mobility and safely garden.    Status  On-going     PT LONG TERM GOAL #5   Title Pt will improve DHI score from 78% to 60% to improve quality of life.   Status On-going     Additional Long Term Goals   Additional Long Term Goals Yes     PT LONG TERM GOAL #6   Title Improve FGA from 15/30 up to 21/30 to demo improving dynamic mobility.   Status New               Plan - 03/14/16 0951    Clinical Impression Statement The patient experiences motion sensitivity during treatment today with horizontal head motion worse than vertical head motion and needs frequent rest breaks to let symptoms settle.  FGA completed and LTG updated.  Continue to work to progress motion sensitivity program, gaze adaptation, and teach compensatory strategies to improve head motion tolerance during ADLs and functional mobility.     PT Treatment/Interventions ADLs/Self Care Home Management;Biofeedback;Canalith Repostioning;Neuromuscular re-education;Balance training;Therapeutic exercise;Therapeutic activities;Functional mobility training;Manual techniques;Stair training;Gait training;DME Instruction;Patient/family education;Orthotic Fit/Training;Vestibular   PT Next Visit Plan Further assess ocular flutter; check HEP; dynamic gait, progress balance activities   Consulted and Agree with Plan of Care Patient      Patient will benefit from skilled therapeutic intervention in order to improve the following deficits and impairments:  Abnormal gait, Dizziness, Decreased mobility, Decreased balance, Impaired vision/preception, Decreased knowledge of use of DME  Visit Diagnosis: Dizziness and giddiness  Other abnormalities of gait and mobility     Problem List Patient Active Problem List   Diagnosis Date Noted  . Dizziness and giddiness 02/16/2016  . Monoallelic mutation of CHEK2 gene 02/02/2015  . Genetic testing 01/31/2015  . Altered mental status 10/25/2014  . Occipital neuralgia of left side   . Stroke ([place]) 10/07/2014  . History of  permanent cardiac pacemaker placement 10/07/2014  . Cephalalgia   . Complicated migraine   . HLD (hyperlipidemia)   . Headache 10/06/2014  . Arthritis of left hip 06/12/2013  . Status post THR (total hip replacement) 06/12/2013  . Constipation 05/25/2013  . Orthostatic hypotension 01/27/2013  . crosstalk-atrial lead 01/27/2013  . Pre-syncope 01/02/2013  . Sinus tachycardia 01/01/2013  . Second degree heart block 12/27/2012  . Hypothyroidism 12/27/2012  . Spigelian hernia 04/20/2011  . DYSPNEA 05/09/2010  . BREAST CANCER 05/05/2010  . ASTHMA 05/05/2010  . HIATAL HERNIA 05/05/2010  . DEGENERATIVE Disk DISEASE 05/05/2010  . OSTEOPENIA 05/05/2010    Chasin Findling, PT 03/14/2016, 9:54 AM  [facility] [address] [place], [place], [address] Phone: [phone]   Fax:  [phone]  Name: [name] MRN: [id number] Date of Birth: 11/14/1945

## 2016-03-14 NOTE — Patient Instructions (Signed)
Gaze Stabilization: Tip Card 1.Target must remain in focus, not blurry, and appear stationary while head is in motion. 2.Perform exercises with small head movements (45 to either side of midline). 3.Increase speed of head motion so long as target is in focus. 4.If you wear eyeglasses, be sure you can see target through lens (therapist will give specific instructions for bifocal / progressive lenses). 5.These exercises may provoke dizziness or nausea. Work through these symptoms. If too dizzy, slow head movement slightly. Rest between each exercise. 6.Exercises demand concentration; avoid distractions. 7.For safety, perform standing exercises close to a counter, wall, corner, or next to someone.  Copyright  VHI. All rights reserved.  Gaze Stabilization: Standing Feet Apart   Feet shoulder width apart, keeping eyes on target on wall 5 feet away, tilt head down slightly and move head side to side for 10 times. Do 2 sessions per day.   Copyright  VHI. All rights reserved.   Compensatory Strategies: Corrective Saccades    1. Place two stationary targets on a wall 5-7 feet away, about 6-8  inches apart.  Move your eyes to target, keep head still. 2. Then move head in direction of target while eyes remain on target. 3/4. Repeat in opposite direction. Perform sitting. Repeat sequence _5-10___ times per session. Do _2___ sessions per day.  Copyright  VHI. All rights reserved.   Feet Together, Head Motion - Eyes Open    With eyes open, feet together, move head slowly: side to side. Repeat __5__ times per session. Do __2__ sessions per day.  Copyright  VHI. All rights reserved.   Special Instructions: Exercises may bring on mild to moderate symptoms of nausea, "whooziness" that resolve within 30 minutes of completing exercises. If symptoms are lasting longer than 30 minutes, modify your exercises by:  >decreasing the # of times you complete each activity >ensuring your symptoms  return to baseline before moving onto the next exercise >dividing up exercises so you do not do them all in one session, but multiple short sessions throughout the day >doing them once a day until symptoms improve

## 2016-03-16 ENCOUNTER — Ambulatory Visit: Payer: Medicare Other | Admitting: Rehabilitative and Restorative Service Providers"

## 2016-03-16 DIAGNOSIS — R2689 Other abnormalities of gait and mobility: Secondary | ICD-10-CM

## 2016-03-16 DIAGNOSIS — R42 Dizziness and giddiness: Secondary | ICD-10-CM

## 2016-03-16 NOTE — Therapy (Signed)
[facility] [address] [place], [place], [address] Phone: [phone]   Fax:  [phone]  Physical Therapy Treatment  Patient Details  Name: [name] MRN: [id number] Date of Birth: 04-30-1946 Referring Provider: Dr. Jannifer Franklin  Encounter Date: 03/16/2016      PT End of Session - 03/16/16 1031    Visit Number 3   Number of Visits 9   Date for PT Re-Evaluation 03/30/16   Authorization Type G-CODE AND PROGRESS NOTE EVERY 10TH VISIT. TRICARE-NO PTAS   PT Start Time 1020   PT Stop Time 1100   PT Time Calculation (min) 40 min   Activity Tolerance Patient tolerated treatment well   Behavior During Therapy WFL for tasks assessed/performed      Past Medical History:  Diagnosis Date  . ADHD (attention deficit hyperactivity disorder)   . Allergy    trees/pollen, mold, fungus, dust mites. Takes allergy shots  . Arthritis    PAIN AND OA LEFT HIP  . Asthma    allergist Dr Olena Heckle- monthly allergy injections  . Autonomic dysfunction    CENTRAL NERVOUS SYSTEM NEUROPATHY - DX BY DR. Erling Cruz MORE THAN 10 YRS AGO - AND IT IS FELT TO CONTRIBUTE TO THE AUTOMIC  DYSFUNCTION-- PT HAS NUMBNESS LEGS AND FEET AND SOMETIIMES TIPS OF FINGER, SEVERE CONSTIPATION( NO SENSATION TO HAVE BM ), DOUBLE VISION, ORTHOSTATIC HYPOTENSION  . Blood transfusion   . Breast cancer ([place])    right side  . Bruises easily   . Cataract   . Chest pain    a. 12/2012 Cath: nl cors, EF 55-65%.  . CHF (congestive heart failure) ([place])   . Complication of anesthesia    reaction to some anesthetics/ 7/12 anesth record on chart- states prefers epidural  . Depression   . Diverticulosis   . Dysrhythmia    HX OF HIGH GRADE HEART BLOCK - REQUIRED PACEMAKER INSERTION  . Esophageal stricture   . Gastritis   . GERD (gastroesophageal reflux disease)   . H/O hiatal hernia   . Hemorrhoids   . Hernia of abdominal wall    spigelian hernia RLQ - SURGERY TO REPAIR   . Hypothyroidism   . Interstitial cystitis   . Latex allergy, contact dermatitis   . Mallory - Weiss tear    HEALED   . Neuromuscular disorder (HCC)    central nervous system neuropathy- seen per Dr Erling Cruz  . Pacemaker   . Pericardial effusion    a. 12/2012 following ppm placement;  b. 01/01/2013 Echo: EF 55-60%, small pericardial effusion w/o RV collapse-->No need for tap/window.  Marland Kitchen PONV (postoperative nausea and vomiting)    pt needs scop patch  . Recurrent upper respiratory infection (URI) 1/13- to present   bronchitis following surgery- states improved but still with cough. OV with Clearance Dr [name] 09/06/11 on chart  . Shortness of breath    AT TIMES - BUT MUCH IMPROVED AFTER PACEMAKER WAS REPROGRAMED.  Marland Kitchen Symptomatic bradycardia    a. 12/2012 s/p MDT dual chamber PPM, ser # [id number]; b. 12/2012 post-op course complicated by pericardial effusinon req lead revision.  . Thyroid disease     Past Surgical History:  Procedure Laterality Date  . ABDOMINAL HYSTERECTOMY  1974  . BACK SURGERY     cervical fusion 4-5 with plate  . BLADDER SUSPENSION    . BREAST BIOPSY  2002   NO BLOOD PRESSURES ON RIGHT SIDE/   s/p  axillary node dissection  . CYSTO  WITH HYDRODISTENSION  09/07/2011   Procedure: CYSTOSCOPY/HYDRODISTENSION;  Surgeon: Ailene Rud, MD;  Location: [facility];  Service: Urology;  Laterality: N/A;  INSTILLATION OF MARCAINE/PYRIDIUM INSTILLATION OF MARCAINE/KENALOG  . CYSTOSCOPY  1975, 2007  . EYE SURGERY     LASIK EYE SURGERY BILATERAL  . LAPAROSCOPY  1973  . LEAD REVISION N/A 12/30/2012   Procedure: LEAD REVISION;  Surgeon: Evans Lance, MD;  Location: [facility] CATH LAB;  Service: Cardiovascular;  Laterality: N/A;  . LEFT HEART CATHETERIZATION WITH CORONARY ANGIOGRAM N/A 12/29/2012   Procedure: LEFT HEART CATHETERIZATION WITH CORONARY ANGIOGRAM;  Surgeon: Peter M Martinique, MD;  Location[facility] CATH LAB;  Service: Cardiovascular;  Laterality: N/A;  . MASTECTOMY MODIFIED RADICAL      right; with immediate reconstruction  . MYRINGOPLASTY  1962  . NECK SURGERY     c4-5 ruptured disc  . OOPHORECTOMY  1982  . PACEMAKER INSERTION    . PERMANENT PACEMAKER INSERTION N/A 12/29/2012   Procedure: PERMANENT PACEMAKER INSERTION;  Surgeon: Deboraha Sprang, MD;  Location: [facility] CATH LAB;  Service: Cardiovascular;  Laterality: N/A;  . RECTOCELE REPAIR     with cystocele repair  . TONSILLECTOMY    . TOTAL HIP ARTHROPLASTY Right   . TOTAL HIP ARTHROPLASTY Left 06/12/2013   Procedure: LEFT TOTAL HIP ARTHROPLASTY ANTERIOR APPROACH;  Surgeon: Mcarthur Rossetti, MD;  Location: [facility];  Service: Orthopedics;  Laterality: Left;  . TYMPANOPLASTY  1973   right  . ULNAR NERVE TRANSPOSITION Right   . VENTRAL HERNIA REPAIR  05/30/2011   Procedure: HERNIA REPAIR VENTRAL ADULT;  Surgeon: Haywood Lasso, MD;  Location: [facility];  Service: General;  Laterality: Right;  repair right spigelian hernia    There were no vitals filed for this visit.      Subjective Assessment - 03/16/16 1025    Subjective The patient reports not much progress on HEP and only tolerating 6 times of eye/head coordination before some mild nausea begins.    Pt took meclizine this morning @ 8:30am.  PATIENT UPDATED PMH:  Patient reported h/o multiple surgeries on R ear and R mastoidectomy when she was 70 years old.     Patient Stated Goals Get rid of this dizziness and feel comfortable doing different activities. "I want to get back to being me."   Currently in Pain? No/denies       NEUROMUSCULAR RE-EDUCATION: Quarter turns to 1/2 turns with eyes+head, then body for compensatory visual fixation during functional tasks Standing horizontal rotation moving R<>L to reach for ball with stopping in the middle to allow symptoms to settle and return to baseline Sit<>bilateral sidelying without nystagmus or ocular abnormalities noted Eye hand coordination seated with tennis ball toss and head/eye tracking  activities Reviewed gaze x 1 adaptation @ 5 feet with cues on speed of movement *this provokes sensation of visual blurring  SELF CARE/HOME MANAGEMENT: Discussed compensatory strategies in busy environments Discussed goals of PT and patient would define success if able to walk outdoors for exercise, enter into restaurants, enter into public, be able to do grocery shopping, "get back to being herself".   Also being able to participate in recreational tasks of completing artwork. She feels that her eyes hinder her ability to do art and her head when she moves describing a "whoozy" sensation where things are not clear.  Gait: Dynamic gait activities with ball toss up/down x 50 feet x 4 reps Gait activities encouraging increased stride length and arm swing  PT Short Term Goals - 02/29/16 1344      PT SHORT TERM GOAL #1   Title same as LTGs           PT Long Term Goals - 03/14/16 0946      PT LONG TERM GOAL #1   Title Pt will be IND In HEP to improve dizziness, balance, and gait. TARGET DATE FOR ALL LTGS: 03/28/16   Status On-going     PT LONG TERM GOAL #2   Title Perform FGA and write goal as indicated.   Baseline Scored 15/30 on FGA on 03/14/2016   Status Achieved     PT LONG TERM GOAL #3   Title Pt will report average dizziness will be </=3/10 in order to perform ADLs in safe manner.    Status On-going     PT LONG TERM GOAL #4   Title Pt will amb. 1000' over even/uneven terrain, IND, while performing head turns without dizziness incr. >/=1 point in order to improve functional mobility and safely garden.    Status On-going     PT LONG TERM GOAL #5   Title Pt will improve DHI score from 78% to 60% to improve quality of life.   Status On-going     Additional Long Term Goals   Additional Long Term Goals Yes     PT LONG TERM GOAL #6   Title Improve FGA from 15/30 up to 21/30 to demo improving dynamic mobility.   Status New               Plan - 03/16/16  1419    Clinical Impression Statement The patient noted medical history today that would impact function of R inner ear reporting cholesteatoma with multiple surgeries and R mastoidectomy.  PT discussed with her that current exercises are still appropriate as we are trying to improve function of the vestibular system for balance and head motion, as well as improve tolerance to visually stimulating environments.  Continue working towards [employer].    PT Treatment/Interventions ADLs/Self Care Home Management;Biofeedback;Canalith Repostioning;Neuromuscular re-education;Balance training;Therapeutic exercise;Therapeutic activities;Functional mobility training;Manual techniques;Stair training;Gait training;DME Instruction;Patient/family education;Orthotic Fit/Training;Vestibular   PT Next Visit Plan Work on 1/4 to 1/2 turns with visual fixation, dynamic gait activities, progress balance HEP and gaze as indicated for home.   Consulted and Agree with Plan of Care Patient      Patient will benefit from skilled therapeutic intervention in order to improve the following deficits and impairments:  Abnormal gait, Dizziness, Decreased mobility, Decreased balance, Impaired vision/preception, Decreased knowledge of use of DME  Visit Diagnosis: Dizziness and giddiness  Other abnormalities of gait and mobility     Problem List Patient Active Problem List   Diagnosis Date Noted  . Dizziness and giddiness 02/16/2016  . Monoallelic mutation of CHEK2 gene 02/02/2015  . Genetic testing 01/31/2015  . Altered mental status 10/25/2014  . Occipital neuralgia of left side   . Stroke ([place]) 10/07/2014  . History of permanent cardiac pacemaker placement 10/07/2014  . Cephalalgia   . Complicated migraine   . HLD (hyperlipidemia)   . Headache 10/06/2014  . Arthritis of left hip 06/12/2013  . Status post THR (total hip replacement) 06/12/2013  . Constipation 05/25/2013  . Orthostatic hypotension 01/27/2013  .  crosstalk-atrial lead 01/27/2013  . Pre-syncope 01/02/2013  . Sinus tachycardia 01/01/2013  . Second degree heart block 12/27/2012  . Hypothyroidism 12/27/2012  . Spigelian hernia 04/20/2011  . DYSPNEA 05/09/2010  . BREAST CANCER 05/05/2010  .  ASTHMA 05/05/2010  . HIATAL HERNIA 05/05/2010  . DEGENERATIVE Disk DISEASE 05/05/2010  . OSTEOPENIA 05/05/2010    Renesmay Nesbitt, PT 03/16/2016, 2:24 PM  [facility] [address] [place], [place], [address] Phone: [phone]   Fax:  [phone]  Name: [name] MRN: [id number] Date of Birth: 1945-05-26

## 2016-03-18 ENCOUNTER — Encounter: Payer: Self-pay | Admitting: Internal Medicine

## 2016-03-20 ENCOUNTER — Ambulatory Visit: Payer: Medicare Other

## 2016-03-20 DIAGNOSIS — R42 Dizziness and giddiness: Secondary | ICD-10-CM

## 2016-03-20 DIAGNOSIS — R2689 Other abnormalities of gait and mobility: Secondary | ICD-10-CM | POA: Diagnosis not present

## 2016-03-20 NOTE — Therapy (Signed)
[facility] [address] [place], [place], [address] Phone: [phone]   Fax:  [phone]  Physical Therapy Treatment  Patient Details  Name: [name] MRN: [id number] Date of Birth: 09-28-1945 Referring Provider: Dr. Jannifer Franklin  Encounter Date: 03/20/2016      PT End of Session - 03/20/16 1307    Visit Number 4   Number of Visits 9   Date for PT Re-Evaluation 03/30/16   Authorization Type G-CODE AND PROGRESS NOTE EVERY 10TH VISIT. TRICARE-NO PTAS   PT Start Time 1155  pt late   PT Stop Time 1233   PT Time Calculation (min) 38 min   Equipment Utilized During Treatment Gait belt   Activity Tolerance Patient tolerated treatment well   Behavior During Therapy WFL for tasks assessed/performed      Past Medical History:  Diagnosis Date  . ADHD (attention deficit hyperactivity disorder)   . Allergy    trees/pollen, mold, fungus, dust mites. Takes allergy shots  . Arthritis    PAIN AND OA LEFT HIP  . Asthma    allergist Dr Olena Heckle- monthly allergy injections  . Autonomic dysfunction    CENTRAL NERVOUS SYSTEM NEUROPATHY - DX BY DR. Erling Cruz MORE THAN 10 YRS AGO - AND IT IS FELT TO CONTRIBUTE TO THE AUTOMIC  DYSFUNCTION-- PT HAS NUMBNESS LEGS AND FEET AND SOMETIIMES TIPS OF FINGER, SEVERE CONSTIPATION( NO SENSATION TO HAVE BM ), DOUBLE VISION, ORTHOSTATIC HYPOTENSION  . Blood transfusion   . Breast cancer ([place])    right side  . Bruises easily   . Cataract   . Chest pain    a. 12/2012 Cath: nl cors, EF 55-65%.  . CHF (congestive heart failure) ([place])   . Complication of anesthesia    reaction to some anesthetics/ 7/12 anesth record on chart- states prefers epidural  . Depression   . Diverticulosis   . Dysrhythmia    HX OF HIGH GRADE HEART BLOCK - REQUIRED PACEMAKER INSERTION  . Esophageal stricture   . Gastritis   . GERD (gastroesophageal reflux disease)   . H/O hiatal hernia   . Hemorrhoids   . Hernia of  abdominal wall    spigelian hernia RLQ - SURGERY TO REPAIR  . Hypothyroidism   . Interstitial cystitis   . Latex allergy, contact dermatitis   . Mallory - Weiss tear    HEALED   . Neuromuscular disorder (HCC)    central nervous system neuropathy- seen per Dr Erling Cruz  . Pacemaker   . Pericardial effusion    a. 12/2012 following ppm placement;  b. 01/01/2013 Echo: EF 55-60%, small pericardial effusion w/o RV collapse-->No need for tap/window.  Marland Kitchen PONV (postoperative nausea and vomiting)    pt needs scop patch  . Recurrent upper respiratory infection (URI) 1/13- to present   bronchitis following surgery- states improved but still with cough. OV with [facility] Dr [name] 09/06/11 on chart  . Shortness of breath    AT TIMES - BUT MUCH IMPROVED AFTER PACEMAKER WAS REPROGRAMED.  Marland Kitchen Symptomatic bradycardia    a. 12/2012 s/p MDT dual chamber PPM, ser # [id number]; b. 12/2012 post-op course complicated by pericardial effusinon req lead revision.  . Thyroid disease     Past Surgical History:  Procedure Laterality Date  . ABDOMINAL HYSTERECTOMY  1974  . BACK SURGERY     cervical fusion 4-5 with plate  . BLADDER SUSPENSION    . BREAST BIOPSY  2002   NO BLOOD PRESSURES ON RIGHT  SIDE/   s/p  axillary node dissection  . CYSTO WITH HYDRODISTENSION  09/07/2011   Procedure: CYSTOSCOPY/HYDRODISTENSION;  Surgeon: Ailene Rud, MD;  Location: [facility];  Service: Urology;  Laterality: N/A;  INSTILLATION OF MARCAINE/PYRIDIUM INSTILLATION OF MARCAINE/KENALOG  . CYSTOSCOPY  1975, 2007  . EYE SURGERY     LASIK EYE SURGERY BILATERAL  . LAPAROSCOPY  1973  . LEAD REVISION N/A 12/30/2012   Procedure: LEAD REVISION;  Surgeon: Evans Lance, MD;  Location: [facility] CATH LAB;  Service: Cardiovascular;  Laterality: N/A;  . LEFT HEART CATHETERIZATION WITH CORONARY ANGIOGRAM N/A 12/29/2012   Procedure: LEFT HEART CATHETERIZATION WITH CORONARY ANGIOGRAM;  Surgeon: Peter M Martinique, MD;  Location: [facility] CATH LAB;  Service:  Cardiovascular;  Laterality: N/A;  . MASTECTOMY MODIFIED RADICAL     right; with immediate reconstruction  . MYRINGOPLASTY  1962  . NECK SURGERY     c4-5 ruptured disc  . OOPHORECTOMY  1982  . PACEMAKER INSERTION    . PERMANENT PACEMAKER INSERTION N/A 12/29/2012   Procedure: PERMANENT PACEMAKER INSERTION;  Surgeon: Deboraha Sprang, MD;  Location: [facility] CATH LAB;  Service: Cardiovascular;  Laterality: N/A;  . RECTOCELE REPAIR     with cystocele repair  . TONSILLECTOMY    . TOTAL HIP ARTHROPLASTY Right   . TOTAL HIP ARTHROPLASTY Left 06/12/2013   Procedure: LEFT TOTAL HIP ARTHROPLASTY ANTERIOR APPROACH;  Surgeon: Mcarthur Rossetti, MD;  Location: [facility];  Service: Orthopedics;  Laterality: Left;  . TYMPANOPLASTY  1973   right  . ULNAR NERVE TRANSPOSITION Right   . VENTRAL HERNIA REPAIR  05/30/2011   Procedure: HERNIA REPAIR VENTRAL ADULT;  Surgeon: Haywood Lasso, MD;  Location: [facility];  Service: General;  Laterality: Right;  repair right spigelian hernia    There were no vitals filed for this visit.      Subjective Assessment - 03/20/16 1157    Subjective Pt reports she has been performing HEP as prescribed and states that head turns are still difficult. Pt reports she has a stye in L eye and it is painful today.   Pertinent History Latex allergy, Pacemaker, CHF, orthostatic hypotension, second degree heart block, migraine, one time occcipital neuralgia (L-side), osteopenia, hypothyroidism, neuropathy, arthritis, hx of B THA (ant. approach), asthma, hx of Breast CA, hx of vertigo   Patient Stated Goals Get rid of this dizziness and feel comfortable doing different activities. "I want to get back to being me."   Currently in Pain? Yes   Pain Score 4    Pain Location Eye   Pain Orientation Left   Pain Descriptors / Indicators Aching   Pain Onset In the past 7 days   Pain Frequency Constant   Aggravating Factors  blinking eye   Pain Relieving Factors hot towel  (soaking eye as it was closed this morning)                              Balance Exercises - 03/20/16 1301      Balance Exercises: Standing   Gait with Head Turns Forward;Intermittent upper extremity support;Other (comment)   Turning 10 reps;5 reps;Both   Marching Limitations Pt performed B hip marches (amb.) in // bars with intermittent 1 UE support with cues for technique to improve SLS time and to improve lat/ant. wt. shifting. 4x7' Min guard for safety.    Other Standing Exercises Pt performed 1/4 and 1/2 turns over  non-compliant surfaces with eyes+head, then body as wooziness/dizziness incr. if pt turn body first x5 reps (1/4 turns) and x10 reps (1/2 turns). Pt performed 115'x2 gait with head turns once looking for playing cards (missed 6 cards) and once while performing head turns/nods. All performed with min guard to min A for safety and to maintain balance.  Pt performed x10reps of ball toss in static standing position with S for safety, pt reported "Head chills" which quickly resolved after ceasing activity. Pt reports wooziness lasted approx. 30-60 sec. After each activity.           PT Education - 03/20/16 1305    Education provided Yes   Education Details PT encouraged pt to improve stride length and lat/ant. weight shifting during gait, and to trial looking 10' ahead vs. straight down during amb. in order to gradually progress to enviromental scanning to improve safety. PT educated pt on performing HEP as prescribed and to take rest break only if dizziness >/=5-6/10, in order to make gains.   Person(s) Educated Patient   Methods Explanation   Comprehension Verbalized understanding          PT Short Term Goals - 02/29/16 1344      PT SHORT TERM GOAL #1   Title same as LTGs           PT Long Term Goals - 03/14/16 0946      PT LONG TERM GOAL #1   Title Pt will be IND In HEP to improve dizziness, balance, and gait. TARGET DATE FOR ALL LTGS:  03/28/16   Status On-going     PT LONG TERM GOAL #2   Title Perform FGA and write goal as indicated.   Baseline Scored 15/30 on FGA on 03/14/2016   Status Achieved     PT LONG TERM GOAL #3   Title Pt will report average dizziness will be </=3/10 in order to perform ADLs in safe manner.    Status On-going     PT LONG TERM GOAL #4   Title Pt will amb. 1000' over even/uneven terrain, IND, while performing head turns without dizziness incr. >/=1 point in order to improve functional mobility and safely garden.    Status On-going     PT LONG TERM GOAL #5   Title Pt will improve DHI score from 78% to 60% to improve quality of life.   Status On-going     Additional Long Term Goals   Additional Long Term Goals Yes     PT LONG TERM GOAL #6   Title Improve FGA from 15/30 up to 21/30 to demo improving dynamic mobility.   Status New               Plan - 03/20/16 1307    Clinical Impression Statement Pt noted to experience improved lat/ant. weight shifting during amb. and turns, after PT provided cues and demo. Pt required standing and seated rest breaks to allow wooziness to subside prior to performing next activity. Pt continues to experience incr. postural sway and decr. stride length (shuffle like steps) during dynamic gait activities. Continue with POC.    PT Treatment/Interventions ADLs/Self Care Home Management;Biofeedback;Canalith Repostioning;Neuromuscular re-education;Balance training;Therapeutic exercise;Therapeutic activities;Functional mobility training;Manual techniques;Stair training;Gait training;DME Instruction;Patient/family education;Orthotic Fit/Training;Vestibular   PT Next Visit Plan Continue to Work on 1/4 to 1/2 turns with visual fixation, dynamic gait activities, progress balance HEP and gaze as indicated for home.   Consulted and Agree with Plan of Care Patient  Patient will benefit from skilled therapeutic intervention in order to improve the following  deficits and impairments:  Abnormal gait, Dizziness, Decreased mobility, Decreased balance, Impaired vision/preception, Decreased knowledge of use of DME  Visit Diagnosis: Dizziness and giddiness  Other abnormalities of gait and mobility     Problem List Patient Active Problem List   Diagnosis Date Noted  . Dizziness and giddiness 02/16/2016  . Monoallelic mutation of CHEK2 gene 02/02/2015  . Genetic testing 01/31/2015  . Altered mental status 10/25/2014  . Occipital neuralgia of left side   . Stroke ([place]) 10/07/2014  . History of permanent cardiac pacemaker placement 10/07/2014  . Cephalalgia   . Complicated migraine   . HLD (hyperlipidemia)   . Headache 10/06/2014  . Arthritis of left hip 06/12/2013  . Status post THR (total hip replacement) 06/12/2013  . Constipation 05/25/2013  . Orthostatic hypotension 01/27/2013  . crosstalk-atrial lead 01/27/2013  . Pre-syncope 01/02/2013  . Sinus tachycardia 01/01/2013  . Second degree heart block 12/27/2012  . Hypothyroidism 12/27/2012  . Spigelian hernia 04/20/2011  . DYSPNEA 05/09/2010  . BREAST CANCER 05/05/2010  . ASTHMA 05/05/2010  . HIATAL HERNIA 05/05/2010  . DEGENERATIVE Disk DISEASE 05/05/2010  . OSTEOPENIA 05/05/2010    Miller,Jennifer L 03/20/2016, 1:10 PM  [facility] [address] [place], [place], [address] Phone: [phone]   Fax:  [phone]  Name: [name] MRN: [id number] Date of Birth: 10-08-1945  Geoffry Paradise, PT,DPT 03/20/16 1:11 PM Phone: [phone] Fax: [phone]

## 2016-03-22 ENCOUNTER — Ambulatory Visit: Payer: Medicare Other | Admitting: Rehabilitative and Restorative Service Providers"

## 2016-03-22 DIAGNOSIS — R42 Dizziness and giddiness: Secondary | ICD-10-CM | POA: Diagnosis not present

## 2016-03-22 DIAGNOSIS — R2689 Other abnormalities of gait and mobility: Secondary | ICD-10-CM | POA: Diagnosis not present

## 2016-03-22 NOTE — Patient Instructions (Signed)
Walking Toward Target    Keeping eyes on target positioned 10-15 feet away, walk towards the target until 5 feet away.  Then walk backwards to return to starting point. NO HEAD TURN.   NEAR A SUPPORT SURFACE (wall or countertop). Copyright  [employer]. All rights reserved.

## 2016-03-23 NOTE — Therapy (Signed)
[facility] [address] [place], [place], [address] Phone: [phone]   Fax:  [phone]  Physical Therapy Treatment  Patient Details  Name: [name] MRN: [id number] Date of Birth: 09-21-1945 Referring Provider: Dr. Jannifer Franklin  Encounter Date: 03/22/2016      PT End of Session - 03/22/16 1113    Visit Number 5   Number of Visits 9   Date for PT Re-Evaluation 03/30/16   Authorization Type G-CODE AND PROGRESS NOTE EVERY 10TH VISIT. [employer]IC[employer]-NO PTAS   PT Start Time 1112   PT Stop Time 1152   PT Time Calculation (min) 40 min   Equipment Utilized During Treatment Gait belt   Activity Tolerance Patient tolerated treatment well   Behavior During Therapy WFL for tasks assessed/performed      Past Medical History:  Diagnosis Date  . ADHD (attention deficit hyperactivity disorder)   . Allergy    trees/pollen, mold, fungus, dust mites. Takes allergy shots  . Arthritis    PAIN AND OA LEFT HIP  . Asthma    allergist Dr Olena Heckle- monthly allergy injections  . Autonomic dysfunction    CENTRAL NERVOUS SYSTEM NEUROPATHY - DX BY DR. Erling Cruz MORE THAN 10 YRS AGO - AND IT IS FELT TO CONTRIBUTE TO THE AUTOMIC  DYSFUNCTION-- PT HAS NUMBNESS LEGS AND FEET AND SOMETIIMES TIPS OF FINGER, SEVERE CONSTIPATION( NO SENSATION TO HAVE BM ), DOUBLE VISION, ORTHOSTATIC HYPOTENSION  . Blood transfusion   . Breast cancer ([place])    right side  . Bruises easily   . Cataract   . Chest pain    a. 12/2012 Cath: nl cors, EF 55-65%.  . CHF (congestive heart failure) ([place])   . Complication of anesthesia    reaction to some anesthetics/ 7/12 anesth record on chart- states prefers epidural  . Depression   . Diverticulosis   . Dysrhythmia    HX OF HIGH GRADE HEART BLOCK - REQUIRED PACEMAKER INSERTION  . Esophageal stricture   . Gastritis   . GERD (gastroesophageal reflux disease)   . H/O hiatal hernia   . Hemorrhoids   . Hernia of abdominal  wall    spigelian hernia RLQ - SURGERY TO REPAIR  . Hypothyroidism   . Interstitial cystitis   . Latex allergy, contact dermatitis   . Mallory - Weiss tear    HEALED   . Neuromuscular disorder ([facility])    central nervous system neuropathy- seen per Dr Erling Cruz  . Pacemaker   . Pericardial effusion    a. 12/2012 following ppm placement;  b. 01/01/2013 Echo: EF 55-60%, small pericardial effusion w/o RV collapse-->No need for tap/window.  Marland Kitchen PONV (postoperative nausea and vomiting)    pt needs scop patch  . Recurrent upper respiratory infection (URI) 1/13- to present   bronchitis following surgery- states improved but still with cough. OV with [facility] Dr [name] 09/06/11 on chart  . Shortness of breath    AT TIMES - BUT MUCH IMPROVED AFTER PACEMAKER WAS REPROGRAMED.  Marland Kitchen Symptomatic bradycardia    a. 12/2012 s/p MDT dual chamber PPM, ser # [id number]; b. 12/2012 post-op course complicated by pericardial effusinon req lead revision.  . Thyroid disease     Past Surgical History:  Procedure Laterality Date  . ABDOMINAL HYSTERECTOMY  1974  . BACK SURGERY     cervical fusion 4-5 with plate  . BLADDER SUSPENSION    . BREAST BIOPSY  2002   NO BLOOD PRESSURES ON RIGHT SIDE/  s/p  axillary node dissection  . CYSTO WITH HYDRODISTENSION  09/07/2011   Procedure: CYSTOSCOPY/HYDRODISTENSION;  Surgeon: Ailene Rud, MD;  Location: [facility];  Service: Urology;  Laterality: N/A;  INSTILLATION OF MARCAINE/PYRIDIUM INSTILLATION OF MARCAINE/KENALOG  . CYSTOSCOPY  1975, 2007  . EYE SURGERY     LASIK EYE SURGERY BILATERAL  . LAPAROSCOPY  1973  . LEAD REVISION N/A 12/30/2012   Procedure: LEAD REVISION;  Surgeon: Evans Lance, MD;  Location: [facility] CATH LAB;  Service: Cardiovascular;  Laterality: N/A;  . LEFT HEART CATHETERIZATION WITH CORONARY ANGIOGRAM N/A 12/29/2012   Procedure: LEFT HEART CATHETERIZATION WITH CORONARY ANGIOGRAM;  Surgeon: Peter M Martinique, MD;  Location[facility] CATH LAB;  Service: Cardiovascular;   Laterality: N/A;  . MASTECTOMY MODIFIED RADICAL     right; with immediate reconstruction  . MYRINGOPLASTY  1962  . NECK SURGERY     c4-5 ruptured disc  . OOPHORECTOMY  1982  . PACEMAKER INSERTION    . PERMANENT PACEMAKER INSERTION N/A 12/29/2012   Procedure: PERMANENT PACEMAKER INSERTION;  Surgeon: Deboraha Sprang, MD;  Location: [facility] CATH LAB;  Service: Cardiovascular;  Laterality: N/A;  . RECTOCELE REPAIR     with cystocele repair  . TONSILLECTOMY    . TOTAL HIP ARTHROPLASTY Right   . TOTAL HIP ARTHROPLASTY Left 06/12/2013   Procedure: LEFT TOTAL HIP ARTHROPLASTY ANTERIOR APPROACH;  Surgeon: Mcarthur Rossetti, MD;  Location: [facility];  Service: Orthopedics;  Laterality: Left;  . TYMPANOPLASTY  1973   right  . ULNAR NERVE TRANSPOSITION Right   . VENTRAL HERNIA REPAIR  05/30/2011   Procedure: HERNIA REPAIR VENTRAL ADULT;  Surgeon: Haywood Lasso, MD;  Location: [facility];  Service: General;  Laterality: Right;  repair right spigelian hernia    There were no vitals filed for this visit.      Subjective Assessment - 03/22/16 1112    Subjective The patient still notes difficulties daily with turns and ADLs in the house. Rates average dizziness 5/10- Sunday it was bad and took meclizine, Monday and Tuesday were good days (still taking meclizine at night).     Pertinent History Latex allergy, Pacemaker, CHF, orthostatic hypotension, second degree heart block, migraine, one time occcipital neuralgia (L-side), osteopenia, hypothyroidism, neuropathy, arthritis, hx of B THA (ant. approach), asthma, hx of Breast CA, hx of vertigo   Patient Stated Goals Get rid of this dizziness and feel comfortable doing different activities. "I want to get back to being me."   Currently in Pain? No/denies      NEUROMUSCULAR RE-EDUCATION: Sidestepping with cone taps with CGA for safety Standing walking to/from target fixated at eye level 15 feet away moving ant/post from target for  convergence while in motion and to reduce motion sensitivity to this stimulus (provided for HEP) Reviewed HEP for gaze and compensatory visual saccades- patient still notes dizziness/nausea per report when performing in home environment Corner balance exercises with feet together + eyes closed, feet in heel/toe x 30 seconds R and L foot posterior, and partial heel toe with eyes open/closed  Gait: Gait activities working on turns negotiating cones and stepping over 6" obstacles randomly placed with SBA  Dynamic gait activities performing ball toss vertically x 200 feet with CGA for safety Ant/posterior walking with SBA for safety  SELF CARE/HOME MANAGEMENT: Discussed visual complaints with patient-- she reports double vision x years (subjectively 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  She felt  this symptom improved until last year after complex migraine.  In therapy, the patient does not demo ability to adduct eyes when moving together, but can adduct them when moving individually (one eye covered and the uncovered eye following target).  She has the ocular muscle ROM, but does not coordinate the movement together upon exam.  This appears to lead to dec'd tolerance to approaching people within close range and dec'd tolerance to visual flow in busy environments. PT and patient discussed that therapy cannot address double vision that has been present x years and that our focus will continue to be on functional tolerance to movement, safety during dynamic tasks and improved balance.       PT Education - 03/22/16 1159    Education provided Yes   Education Details Added walking to/from target from 15 ft up to 5 ft   Person(s) Educated Patient   Methods Explanation;Demonstration;Handout   Comprehension Verbalized understanding;Returned demonstration          PT Short Term Goals - 02/29/16 1344      PT SHORT TERM GOAL #1   Title same as LTGs            PT Long Term Goals - 03/22/16 1115      PT LONG TERM GOAL #1   Title Pt will be IND In HEP to improve dizziness, balance, and gait. TARGET DATE FOR ALL LTGS: 03/28/16   Status On-going     PT LONG TERM GOAL #2   Title Perform FGA and write goal as indicated.   Baseline Scored 15/30 on FGA on 03/14/2016   Status Achieved     PT LONG TERM GOAL #3   Title Pt will report average dizziness will be </=3/10 in order to perform ADLs in safe manner.    Status On-going     PT LONG TERM GOAL #4   Title Pt will amb. 1000' over even/uneven terrain, IND, while performing head turns without dizziness incr. >/=1 point in order to improve functional mobility and safely garden.    Status On-going     PT LONG TERM GOAL #5   Title Pt will improve DHI score from 78% to 60% to improve quality of life.   Status On-going     PT LONG TERM GOAL #6   Title Improve FGA from 15/30 up to 21/30 to demo improving dynamic mobility.   Status New               Plan - 03/23/16 0747    Clinical Impression Statement The patient has variable performance t/o treatment.  She has intermittent shuffling steps and moves into a narrow gait pattern during cone negotiation.  PT instructed her to maintain a wider base of support (to normal 4" stance) versus heel/toe type pattern and this significantly improved her balance during functional tasks.  The patient's visual complaints appear to be longstanding, however have worsened over the last year.  PT to contact MD to determine if any value in further assessment by neuroopthalmologist.     PT Treatment/Interventions ADLs/Self Care Home Management;Biofeedback;Canalith Repostioning;Neuromuscular re-education;Balance training;Therapeutic exercise;Therapeutic activities;Functional mobility training;Manual techniques;Stair training;Gait training;DME Instruction;Patient/family education;Orthotic Fit/Training;Vestibular   PT Next Visit Plan Continue to Work on 1/4 to  1/2 turns with visual fixation, dynamic gait activities, progress balance HEP and gaze as indicated for home.   Consulted and Agree with Plan of Care Patient      Patient will benefit from skilled therapeutic intervention in order to improve the following  deficits and impairments:  Abnormal gait, Dizziness, Decreased mobility, Decreased balance, Impaired vision/preception, Decreased knowledge of use of DME  Visit Diagnosis: Dizziness and giddiness  Other abnormalities of gait and mobility     Problem List Patient Active Problem List   Diagnosis Date Noted  . Dizziness and giddiness 02/16/2016  . Monoallelic mutation of CHEK2 gene 02/02/2015  . Genetic testing 01/31/2015  . Altered mental status 10/25/2014  . Occipital neuralgia of left side   . Stroke ([place]) 10/07/2014  . History of permanent cardiac pacemaker placement 10/07/2014  . Cephalalgia   . Complicated migraine   . HLD (hyperlipidemia)   . Headache 10/06/2014  . Arthritis of left hip 06/12/2013  . Status post THR (total hip replacement) 06/12/2013  . Constipation 05/25/2013  . Orthostatic hypotension 01/27/2013  . crosstalk-atrial lead 01/27/2013  . Pre-syncope 01/02/2013  . Sinus tachycardia 01/01/2013  . Second degree heart block 12/27/2012  . Hypothyroidism 12/27/2012  . Spigelian hernia 04/20/2011  . DYSPNEA 05/09/2010  . BREAST CANCER 05/05/2010  . ASTHMA 05/05/2010  . HIATAL HERNIA 05/05/2010  . DEGENERATIVE Disk DISEASE 05/05/2010  . OSTEOPENIA 05/05/2010    Harlin Mazzoni, PT 03/23/2016, 7:52 AM  [facility] [address] [place], [place], [address] Phone: [phone]   Fax:  [phone]  Name: ADELYNA BROCKMAN MRN: [id number] Date of Birth: 11/03/1945

## 2016-03-24 DIAGNOSIS — Z23 Encounter for immunization: Secondary | ICD-10-CM | POA: Diagnosis not present

## 2016-03-27 ENCOUNTER — Ambulatory Visit: Payer: Medicare Other

## 2016-03-27 DIAGNOSIS — R2689 Other abnormalities of gait and mobility: Secondary | ICD-10-CM

## 2016-03-27 DIAGNOSIS — R42 Dizziness and giddiness: Secondary | ICD-10-CM

## 2016-03-27 NOTE — Therapy (Signed)
[facility] [address] [place], [place], [address] Phone: [phone]   Fax:  [phone]  Physical Therapy Treatment  Patient Details  Name: [name] MRN: [id number] Date of Birth: 1945-09-04 Referring Provider: Dr. Jannifer Franklin  Encounter Date: 03/27/2016      PT End of Session - 03/27/16 1301    Visit Number 6   Number of Visits 9   Date for PT Re-Evaluation 03/30/16   Authorization Type G-CODE AND PROGRESS NOTE EVERY 10TH VISIT. [employer]IC[employer]-NO PTAS   PT Start Time 1151  pt arrived late   PT Stop Time 1229   PT Time Calculation (min) 38 min   Equipment Utilized During Treatment Gait belt   Activity Tolerance Patient tolerated treatment well   Behavior During Therapy WFL for tasks assessed/performed      Past Medical History:  Diagnosis Date  . ADHD (attention deficit hyperactivity disorder)   . Allergy    trees/pollen, mold, fungus, dust mites. Takes allergy shots  . Arthritis    PAIN AND OA LEFT HIP  . Asthma    allergist Dr Olena Heckle- monthly allergy injections  . Autonomic dysfunction    CENTRAL NERVOUS SYSTEM NEUROPATHY - DX BY DR. Erling Cruz MORE THAN 10 YRS AGO - AND IT IS FELT TO CONTRIBUTE TO THE AUTOMIC  DYSFUNCTION-- PT HAS NUMBNESS LEGS AND FEET AND SOMETIIMES TIPS OF FINGER, SEVERE CONSTIPATION( NO SENSATION TO HAVE BM ), DOUBLE VISION, ORTHOSTATIC HYPOTENSION  . Blood transfusion   . Breast cancer ([place])    right side  . Bruises easily   . Cataract   . Chest pain    a. 12/2012 Cath: nl cors, EF 55-65%.  . CHF (congestive heart failure) ([place])   . Complication of anesthesia    reaction to some anesthetics/ 7/12 anesth record on chart- states prefers epidural  . Depression   . Diverticulosis   . Dysrhythmia    HX OF HIGH GRADE HEART BLOCK - REQUIRED PACEMAKER INSERTION  . Esophageal stricture   . Gastritis   . GERD (gastroesophageal reflux disease)   . H/O hiatal hernia   . Hemorrhoids   .  Hernia of abdominal wall    spigelian hernia RLQ - SURGERY TO REPAIR  . Hypothyroidism   . Interstitial cystitis   . Latex allergy, contact dermatitis   . Mallory - Weiss tear    HEALED   . Neuromuscular disorder ([facility])    central nervous system neuropathy- seen per Dr Erling Cruz  . Pacemaker   . Pericardial effusion    a. 12/2012 following ppm placement;  b. 01/01/2013 Echo: EF 55-60%, small pericardial effusion w/o RV collapse-->No need for tap/window.  Marland Kitchen PONV (postoperative nausea and vomiting)    pt needs scop patch  . Recurrent upper respiratory infection (URI) 1/13- to present   bronchitis following surgery- states improved but still with cough. OV with [facility] Dr [name] 09/06/11 on chart  . Shortness of breath    AT TIMES - BUT MUCH IMPROVED AFTER PACEMAKER WAS REPROGRAMED.  Marland Kitchen Symptomatic bradycardia    a. 12/2012 s/p MDT dual chamber PPM, ser # [id number]; b. 12/2012 post-op course complicated by pericardial effusinon req lead revision.  . Thyroid disease     Past Surgical History:  Procedure Laterality Date  . ABDOMINAL HYSTERECTOMY  1974  . BACK SURGERY     cervical fusion 4-5 with plate  . BLADDER SUSPENSION    . BREAST BIOPSY  2002   NO BLOOD PRESSURES ON  RIGHT SIDE/   s/p  axillary node dissection  . CYSTO WITH HYDRODISTENSION  09/07/2011   Procedure: CYSTOSCOPY/HYDRODISTENSION;  Surgeon: Ailene Rud, MD;  Location: [facility];  Service: Urology;  Laterality: N/A;  INSTILLATION OF MARCAINE/PYRIDIUM INSTILLATION OF MARCAINE/KENALOG  . CYSTOSCOPY  1975, 2007  . EYE SURGERY     LASIK EYE SURGERY BILATERAL  . LAPAROSCOPY  1973  . LEAD REVISION N/A 12/30/2012   Procedure: LEAD REVISION;  Surgeon: Evans Lance, MD;  Location: [facility] CATH LAB;  Service: Cardiovascular;  Laterality: N/A;  . LEFT HEART CATHETERIZATION WITH CORONARY ANGIOGRAM N/A 12/29/2012   Procedure: LEFT HEART CATHETERIZATION WITH CORONARY ANGIOGRAM;  Surgeon: Peter M Martinique, MD;  Location: [facility] CATH LAB;   Service: Cardiovascular;  Laterality: N/A;  . MASTECTOMY MODIFIED RADICAL     right; with immediate reconstruction  . MYRINGOPLASTY  1962  . NECK SURGERY     c4-5 ruptured disc  . OOPHORECTOMY  1982  . PACEMAKER INSERTION    . PERMANENT PACEMAKER INSERTION N/A 12/29/2012   Procedure: PERMANENT PACEMAKER INSERTION;  Surgeon: Deboraha Sprang, MD;  Location: [facility] CATH LAB;  Service: Cardiovascular;  Laterality: N/A;  . RECTOCELE REPAIR     with cystocele repair  . TONSILLECTOMY    . TOTAL HIP ARTHROPLASTY Right   . TOTAL HIP ARTHROPLASTY Left 06/12/2013   Procedure: LEFT TOTAL HIP ARTHROPLASTY ANTERIOR APPROACH;  Surgeon: Mcarthur Rossetti, MD;  Location: [facility];  Service: Orthopedics;  Laterality: Left;  . TYMPANOPLASTY  1973   right  . ULNAR NERVE TRANSPOSITION Right   . VENTRAL HERNIA REPAIR  05/30/2011   Procedure: HERNIA REPAIR VENTRAL ADULT;  Surgeon: Haywood Lasso, MD;  Location: [facility];  Service: General;  Laterality: Right;  repair right spigelian hernia    There were no vitals filed for this visit.      Subjective Assessment - 03/27/16 1155    Subjective Pt reported she was able to entertain for her son's 50th birthday this weekend, and baked 2 pies. Pt reports this is the first time she could perform activities to prepare for a party in 2.5 years. Pt denied falls or changes since last visit. Pt rates dizzness during standing/walking/turning at 5/10 and 0/10 while seated.   Pertinent History Latex allergy, Pacemaker, CHF, orthostatic hypotension, second degree heart block, migraine, one time occcipital neuralgia (L-side), osteopenia, hypothyroidism, neuropathy, arthritis, hx of B THA (ant. approach), asthma, hx of Breast CA, hx of vertigo   Patient Stated Goals Get rid of this dizziness and feel comfortable doing different activities. "I want to get back to being me."   Currently in Pain? No/denies            [facility] PT Assessment - 03/27/16 1158       Functional Gait  Assessment   Gait assessed  Yes   Gait Level Surface Walks 20 ft in less than 7 sec but greater than 5.5 sec, uses assistive device, slower speed, mild gait deviations, or deviates 6-10 in outside of the 12 in walkway width.  6.8sec.   Change in Gait Speed Able to change speed, demonstrates mild gait deviations, deviates 6-10 in outside of the 12 in walkway width, or no gait deviations, unable to achieve a major change in velocity, or uses a change in velocity, or uses an assistive device.   Gait with Horizontal Head Turns Performs head turns smoothly with slight change in gait velocity (eg, minor disruption to smooth gait path),  deviates 6-10 in outside 12 in walkway width, or uses an assistive device.   Gait with Vertical Head Turns Performs task with moderate change in gait velocity, slows down, deviates 10-15 in outside 12 in walkway width but recovers, can continue to walk.   Gait and Pivot Turn Pivot turns safely in greater than 3 sec and stops with no loss of balance, or pivot turns safely within 3 sec and stops with mild imbalance, requires small steps to catch balance.   Step Over Obstacle Is able to step over 2 stacked shoe boxes taped together (9 in total height) without changing gait speed. No evidence of imbalance.   Gait with Narrow Base of Support Ambulates 7-9 steps.  8 steps   Gait with Eyes Closed Walks 20 ft, slow speed, abnormal gait pattern, evidence for imbalance, deviates 10-15 in outside 12 in walkway width. Requires more than 9 sec to ambulate 20 ft.  9.9sec.   Ambulating Backwards Walks 20 ft, uses assistive device, slower speed, mild gait deviations, deviates 6-10 in outside 12 in walkway width.   Steps Alternating feet, no rail.   Total Score 20                     [facility] Adult PT Treatment/Exercise - 03/27/16 1217      Ambulation/Gait   Ambulation/Gait Yes   Ambulation/Gait Assistance 5: Supervision;4: Min guard   Ambulation/Gait  Assistance Details Pt reported 3/10 dizziness during amb. with head turns, otherwise, 0/10 dizziness during amb. Pt experienced incr. postural sway and required cues to improve shuffling steps/narrow BOS during amb. with head turns. Pt amb. 345' while tossing ball back and forth with rehab tech, pt ceased amb. once wooziness was 7/10 and required standing and seated rest break for it to subside.    Ambulation Distance (Feet) 1000 Feet   Assistive device None   Gait Pattern Step-through pattern;Decreased stride length;Decreased arm swing - right;Decreased arm swing - left;Decreased trunk rotation;Decreased hip/knee flexion - right;Decreased hip/knee flexion - left   Ambulation Surface Level;Indoor             Balance Exercises - 03/27/16 1255      Balance Exercises: Standing   Rockerboard 30 seconds;Intermittent UE support   Other Standing Exercises Performed in // bars with min guard to S for safety, rockerboard static stance for 2x30sec. and then ball toss x10 reps (not on rockerboard). Pt reported 6/10 wooziness during rockerboard activity, therefore, activity ceased.            PT Education - 03/27/16 1300    Education provided Yes   Education Details PT discussed the importance of continuing HEP and to f/u with MD regarding potential referral to neuro opthamologist re: blurred vision. PT discussed goal progress and likelihood of d/c next session.    Person(s) Educated Patient   Methods Explanation   Comprehension Verbalized understanding          PT Short Term Goals - 02/29/16 1344      PT SHORT TERM GOAL #1   Title same as LTGs           PT Long Term Goals - 03/27/16 1315      PT LONG TERM GOAL #1   Title Pt will be IND In HEP to improve dizziness, balance, and gait. TARGET DATE FOR ALL LTGS: 03/28/16   Status On-going     PT LONG TERM GOAL #2   Title Perform FGA and write goal as indicated.  Baseline Scored 15/30 on FGA on 03/14/2016   Status Achieved      PT LONG TERM GOAL #3   Title Pt will report average dizziness will be </=3/10 in order to perform ADLs in safe manner.    Status Not Met     PT LONG TERM GOAL #4   Title Pt will amb. 1000' over even/uneven terrain, IND, while performing head turns without dizziness incr. >/=1 point in order to improve functional mobility and safely garden.    Status Partially Met     PT LONG TERM GOAL #5   Title Pt will improve DHI score from 78% to 60% to improve quality of life.   Status On-going     PT LONG TERM GOAL #6   Title Improve FGA from 15/30 up to 21/30 to demo improving dynamic mobility.   Status Partially Met               Plan - 03/27/16 1302    Clinical Impression Statement Pt partially met LTGs 4 and 5. Pt did not meet LTG 3. Pt continues to experience variable performance during treatment, as she has intermittent shuffle steps which she is able to correct with cues. PT will finish reviewing goals next session and likely d/c pt, as she has maximized her functional gains and residual deficits likely to be contributed to blurred vision.    Rehab Potential Good   Clinical Impairments Affecting Rehab Potential Please see PMH   PT Treatment/Interventions ADLs/Self Care Home Management;Biofeedback;Canalith Repostioning;Neuromuscular re-education;Balance training;Therapeutic exercise;Therapeutic activities;Functional mobility training;Manual techniques;Stair training;Gait training;DME Instruction;Patient/family education;Orthotic Fit/Training;Vestibular   PT Next Visit Plan Assess/progress HEP and check remaining goals, d/c.    Consulted and Agree with Plan of Care Patient      Patient will benefit from skilled therapeutic intervention in order to improve the following deficits and impairments:  Abnormal gait, Dizziness, Decreased mobility, Decreased balance, Impaired vision/preception, Decreased knowledge of use of DME  Visit Diagnosis: Dizziness and giddiness  Other abnormalities  of gait and mobility     Problem List Patient Active Problem List   Diagnosis Date Noted  . Dizziness and giddiness 02/16/2016  . Monoallelic mutation of CHEK2 gene 02/02/2015  . Genetic testing 01/31/2015  . Altered mental status 10/25/2014  . Occipital neuralgia of left side   . Stroke ([place]) 10/07/2014  . History of permanent cardiac pacemaker placement 10/07/2014  . Cephalalgia   . Complicated migraine   . HLD (hyperlipidemia)   . Headache 10/06/2014  . Arthritis of left hip 06/12/2013  . Status post THR (total hip replacement) 06/12/2013  . Constipation 05/25/2013  . Orthostatic hypotension 01/27/2013  . crosstalk-atrial lead 01/27/2013  . Pre-syncope 01/02/2013  . Sinus tachycardia 01/01/2013  . Second degree heart block 12/27/2012  . Hypothyroidism 12/27/2012  . Spigelian hernia 04/20/2011  . DYSPNEA 05/09/2010  . BREAST CANCER 05/05/2010  . ASTHMA 05/05/2010  . HIATAL HERNIA 05/05/2010  . DEGENERATIVE Disk DISEASE 05/05/2010  . OSTEOPENIA 05/05/2010    Ande Therrell L 03/27/2016, 1:17 PM  [facility] [address] [place], [place], [address] Phone: [phone]   Fax:  [phone]  Name: [name] MRN: [id number] Date of Birth: 1945-12-04  Geoffry Paradise, PT,DPT 03/27/16 1:17 PM Phone: [phone] Fax: [phone]

## 2016-03-29 ENCOUNTER — Ambulatory Visit: Payer: Medicare Other

## 2016-03-29 ENCOUNTER — Ambulatory Visit (INDEPENDENT_AMBULATORY_CARE_PROVIDER_SITE_OTHER): Payer: Medicare Other | Admitting: *Deleted

## 2016-03-29 DIAGNOSIS — R2689 Other abnormalities of gait and mobility: Secondary | ICD-10-CM | POA: Diagnosis not present

## 2016-03-29 DIAGNOSIS — R42 Dizziness and giddiness: Secondary | ICD-10-CM | POA: Diagnosis not present

## 2016-03-29 DIAGNOSIS — J309 Allergic rhinitis, unspecified: Secondary | ICD-10-CM | POA: Diagnosis not present

## 2016-03-29 NOTE — Patient Instructions (Addendum)
Walking Toward Target    Keeping eyes on target positioned 10-15 feet away, walk towards the target until 5 feet away.  Then walk backwards to return to starting point. NO HEAD TURN.   NEAR A SUPPORT SURFACE (wall or countertop). Copyright  VHI. All rights reserved.  Gaze Stabilization: Tip Card 1.Target must remain in focus, not blurry, and appear stationary while head is in motion. 2.Perform exercises with small head movements (45 to either side of midline). 3.Increase speed of head motion so long as target is in focus. 4.If you wear eyeglasses, be sure you can see target through lens (therapist will give specific instructions for bifocal / progressive lenses). 5.These exercises may provoke dizziness or nausea. Work through these symptoms. If too dizzy, slow head movement slightly. Rest between each exercise. 6.Exercises demand concentration; avoid distractions. 7.For safety, perform standing exercises close to a counter, wall, corner, or next to someone.  Copyright  VHI. All rights reserved.  Gaze Stabilization: Standing Feet Together    Feet together, keeping eyes on target on wall __5-7__ feet away, tilt head down 15-30 and move head side to side 10 times. Repeat while moving head up and down 10 times Do __2__ sessions per day. Place a chair behind you for safety.  Copyright  VHI. All rights reserved.     Copyright  VHI. All rights reserved.   Compensatory Strategies: Corrective Saccades    1. Place two stationary targets on a wall 5-7 feet away, about 6-8  inches apart.  Move your eyes to target, keep head still. 2. Then move head in direction of target while eyes remain on target. 3/4. Repeat in opposite direction. Perform standing with chair behind you for safety. Repeat sequence _5-10___ times per session. Do _2___ sessions per day.  Copyright  VHI. All rights reserved.    Perform balance activities in a corner with chair in front of you OR at kitchen  sink with a chair behind you for safety: Feet Together, Head Motion - Eyes Open    With eyes open, feet together, move head slowly: side to side. Repeat __5__ times per session. Do __2__ sessions per day.  Copyright  VHI. All rights reserved.     Special Instructions: Exercises may bring on mild to moderate symptoms of nausea, "whooziness" that resolve within 30 minutes of completing exercises. If symptoms are lasting longer than 30 minutes, modify your exercises by:  >decreasing the # of times you complete each activity >ensuring your symptoms return to baseline before moving onto the next exercise >dividing up exercises so you do not do them all in one session, but multiple short sessions throughout the day >doing them once a day until symptoms improve Feet Together (Compliant Surface) Varied Arm Positions - Eyes Closed    Stand on compliant surface: __pillow/cushion______ with feet together and arms at your side. Close eyes and visualize upright position. Hold__10-30__ seconds. Repeat _3___ times per session. Do __1__ sessions per day.  Copyright  VHI. All rights reserved.

## 2016-03-29 NOTE — Therapy (Addendum)
[facility] [address] [place], [place], [address] Phone: [phone]   Fax:  [phone]  Physical Therapy Treatment  Patient Details  Name: [name] MRN: [id number] Date of Birth: 1946/01/22 Referring Provider: Dr. Jannifer Franklin  Encounter Date: 03/29/2016      PT End of Session - 03/29/16 1136    Visit Number 7   Number of Visits 9   Date for PT Re-Evaluation 03/30/16   Authorization Type G-CODE AND PROGRESS NOTE EVERY 10TH VISIT. [employer]ICARE-NO PTAS   PT Start Time 1106   PT Stop Time 1133  pt completed FOTO after session completed (until 1140).   PT Time Calculation (min) 27 min   Equipment Utilized During Treatment --  S prn   Activity Tolerance Patient tolerated treatment well   Behavior During Therapy WFL for tasks assessed/performed      Past Medical History:  Diagnosis Date  . ADHD (attention deficit hyperactivity disorder)   . Allergy    trees/pollen, mold, fungus, dust mites. Takes allergy shots  . Arthritis    PAIN AND OA LEFT HIP  . Asthma    allergist Dr Olena Heckle- monthly allergy injections  . Autonomic dysfunction    CENTRAL NERVOUS SYSTEM NEUROPATHY - DX BY DR. Erling Cruz MORE THAN 10 YRS AGO - AND IT IS FELT TO CONTRIBUTE TO THE AUTOMIC  DYSFUNCTION-- PT HAS NUMBNESS LEGS AND FEET AND SOMETIIMES TIPS OF FINGER, SEVERE CONSTIPATION( NO SENSATION TO HAVE BM ), DOUBLE VISION, ORTHOSTATIC HYPOTENSION  . Blood transfusion   . Breast cancer ([place])    right side  . Bruises easily   . Cataract   . Chest pain    a. 12/2012 Cath: nl cors, EF 55-65%.  . CHF (congestive heart failure) ([place])   . Complication of anesthesia    reaction to some anesthetics/ 7/12 anesth record on chart- states prefers epidural  . Depression   . Diverticulosis   . Dysrhythmia    HX OF HIGH GRADE HEART BLOCK - REQUIRED PACEMAKER INSERTION  . Esophageal stricture   . Gastritis   . GERD (gastroesophageal reflux disease)   .  H/O hiatal hernia   . Hemorrhoids   . Hernia of abdominal wall    spigelian hernia RLQ - SURGERY TO REPAIR  . Hypothyroidism   . Interstitial cystitis   . Latex allergy, contact dermatitis   . Mallory - Weiss tear    HEALED   . Neuromuscular disorder ([facility])    central nervous system neuropathy- seen per Dr Erling Cruz  . Pacemaker   . Pericardial effusion    a. 12/2012 following ppm placement;  b. 01/01/2013 Echo: EF 55-60%, small pericardial effusion w/o RV collapse-->No need for tap/window.  Marland Kitchen PONV (postoperative nausea and vomiting)    pt needs scop patch  . Recurrent upper respiratory infection (URI) 1/13- to present   bronchitis following surgery- states improved but still with cough. OV with [facility] Dr [name] 09/06/11 on chart  . Shortness of breath    AT TIMES - BUT MUCH IMPROVED AFTER PACEMAKER WAS REPROGRAMED.  Marland Kitchen Symptomatic bradycardia    a. 12/2012 s/p MDT dual chamber PPM, ser # [id number]; b. 12/2012 post-op course complicated by pericardial effusinon req lead revision.  . Thyroid disease     Past Surgical History:  Procedure Laterality Date  . ABDOMINAL HYSTERECTOMY  1974  . BACK SURGERY     cervical fusion 4-5 with plate  . BLADDER SUSPENSION    . BREAST BIOPSY  2002   NO BLOOD PRESSURES ON RIGHT SIDE/   s/p  axillary node dissection  . CYSTO WITH HYDRODISTENSION  09/07/2011   Procedure: CYSTOSCOPY/HYDRODISTENSION;  Surgeon: Ailene Rud, MD;  Location: [facility];  Service: Urology;  Laterality: N/A;  INSTILLATION OF MARCAINE/PYRIDIUM INSTILLATION OF MARCAINE/KENALOG  . CYSTOSCOPY  1975, 2007  . EYE SURGERY     LASIK EYE SURGERY BILATERAL  . LAPAROSCOPY  1973  . LEAD REVISION N/A 12/30/2012   Procedure: LEAD REVISION;  Surgeon: Evans Lance, MD;  Location: [facility] CATH LAB;  Service: Cardiovascular;  Laterality: N/A;  . LEFT HEART CATHETERIZATION WITH CORONARY ANGIOGRAM N/A 12/29/2012   Procedure: LEFT HEART CATHETERIZATION WITH CORONARY ANGIOGRAM;  Surgeon: Peter M  Martinique, MD;  Location: [facility] CATH LAB;  Service: Cardiovascular;  Laterality: N/A;  . MASTECTOMY MODIFIED RADICAL     right; with immediate reconstruction  . MYRINGOPLASTY  1962  . NECK SURGERY     c4-5 ruptured disc  . OOPHORECTOMY  1982  . PACEMAKER INSERTION    . PERMANENT PACEMAKER INSERTION N/A 12/29/2012   Procedure: PERMANENT PACEMAKER INSERTION;  Surgeon: Deboraha Sprang, MD;  Location: [facility] CATH LAB;  Service: Cardiovascular;  Laterality: N/A;  . RECTOCELE REPAIR     with cystocele repair  . TONSILLECTOMY    . TOTAL HIP ARTHROPLASTY Right   . TOTAL HIP ARTHROPLASTY Left 06/12/2013   Procedure: LEFT TOTAL HIP ARTHROPLASTY ANTERIOR APPROACH;  Surgeon: Mcarthur Rossetti, MD;  Location: [facility];  Service: Orthopedics;  Laterality: Left;  . TYMPANOPLASTY  1973   right  . ULNAR NERVE TRANSPOSITION Right   . VENTRAL HERNIA REPAIR  05/30/2011   Procedure: HERNIA REPAIR VENTRAL ADULT;  Surgeon: Haywood Lasso, MD;  Location: [facility];  Service: General;  Laterality: Right;  repair right spigelian hernia    There were no vitals filed for this visit.      Subjective Assessment - 03/29/16 1108    Subjective Pt denied falls or changes since last visit. Pt denied dizziness at rest, but pt reports dizziness during amb. 4/10.    Pertinent History Latex allergy, Pacemaker, CHF, orthostatic hypotension, second degree heart block, migraine, one time occcipital neuralgia (L-side), osteopenia, hypothyroidism, neuropathy, arthritis, hx of B THA (ant. approach), asthma, hx of Breast CA, hx of vertigo   Patient Stated Goals Get rid of this dizziness and feel comfortable doing different activities. "I want to get back to being me."   Currently in Pain? No/denies             Neuro re-ed: Pt performed previous and new gaze stab. And balance HEP. Pt required cues and demo for technique (for new HEP only) and was IND in previous HEP. Pt reported 4-5/10 wooziness and "slight  incr. In nausea" during activities and required standing and seated rest breaks for < 30sec. To allow wooziness to subside.       DHI: 36%, indicating pt perceives dizziness has moderate impact on functional abilities. [place] completed at end of session.              PT Education - 03/29/16 1134    Education provided Yes   Education Details PT reviewed and progress HEP as tolerated. Pt discussed goal progress and outcome measures. PT also discussed d/c, and to f/u with MD regarding potential referral to neuro opthamologist for blurred vision. Pt agreeable to d/c, as she has improved and PT and pt feel that blurred vision needs to be  addressed to further improve balance.    Person(s) Educated Patient   Methods Explanation;Demonstration;Verbal cues;Handout   Comprehension Returned demonstration;Verbalized understanding          PT Short Term Goals - 02/29/16 1344      PT SHORT TERM GOAL #1   Title same as LTGs           PT Long Term Goals - 2016-04-27 1138      PT LONG TERM GOAL #1   Title Pt will be IND In HEP to improve dizziness, balance, and gait. TARGET DATE FOR ALL LTGS: 03/28/16   Status Achieved     PT LONG TERM GOAL #2   Title Perform FGA and write goal as indicated.   Baseline Scored 15/30 on FGA on 03/14/2016   Status Achieved     PT LONG TERM GOAL #3   Title Pt will report average dizziness will be </=3/10 in order to perform ADLs in safe manner.    Status Not Met     PT LONG TERM GOAL #4   Title Pt will amb. 1000' over even/uneven terrain, IND, while performing head turns without dizziness incr. >/=1 point in order to improve functional mobility and safely garden.    Status Partially Met     PT LONG TERM GOAL #5   Title Pt will improve DHI score from 78% to 60% to improve quality of life.   Status Achieved     PT LONG TERM GOAL #6   Title Improve FGA from 15/30 up to 21/30 to demo improving dynamic mobility.   Status Partially Met                Plan - 04/27/16 1137    Clinical Impression Statement Pt met LTGs 1 and 5, and tolerated progressed HEP. PT discharging pt, please see pt d/c summary for details.    Rehab Potential Good   Clinical Impairments Affecting Rehab Potential Please see PMH   PT Treatment/Interventions ADLs/Self Care Home Management;Biofeedback;Canalith Repostioning;Neuromuscular re-education;Balance training;Therapeutic exercise;Therapeutic activities;Functional mobility training;Manual techniques;Stair training;Gait training;DME Instruction;Patient/family education;Orthotic Fit/Training;Vestibular   Consulted and Agree with Plan of Care Patient      Patient will benefit from skilled therapeutic intervention in order to improve the following deficits and impairments:  Abnormal gait, Dizziness, Decreased mobility, Decreased balance, Impaired vision/preception, Decreased knowledge of use of DME  Visit Diagnosis: Dizziness and giddiness  Other abnormalities of gait and mobility       G-Codes - 04-27-2016 1138    Functional Assessment Tool Used DHI: 36%   Functional Limitation Self care   Self Care Goal Status (T3646) At least 1 percent but less than 20 percent impaired, limited or restricted   Self Care Discharge Status [id number]) At least 20 percent but less than 40 percent impaired, limited or restricted      Problem List Patient Active Problem List   Diagnosis Date Noted  . Dizziness and giddiness 02/16/2016  . Monoallelic mutation of CHEK2 gene 02/02/2015  . Genetic testing 01/31/2015  . Altered mental status 10/25/2014  . Occipital neuralgia of left side   . Stroke ([place]) 10/07/2014  . History of permanent cardiac pacemaker placement 10/07/2014  . Cephalalgia   . Complicated migraine   . HLD (hyperlipidemia)   . Headache 10/06/2014  . Arthritis of left hip 06/12/2013  . Status post THR (total hip replacement) 06/12/2013  . Constipation 05/25/2013  . Orthostatic hypotension  01/27/2013  . crosstalk-atrial lead 01/27/2013  . Pre-syncope 01/02/2013  .  Sinus tachycardia 01/01/2013  . Second degree heart block 12/27/2012  . Hypothyroidism 12/27/2012  . Spigelian hernia 04/20/2011  . DYSPNEA 05/09/2010  . BREAST CANCER 05/05/2010  . ASTHMA 05/05/2010  . HIATAL HERNIA 05/05/2010  . DEGENERATIVE Disk DISEASE 05/05/2010  . OSTEOPENIA 05/05/2010    Vinette Crites L 03/29/2016, 12:07 PM  [facility] [address] [place], [place], [address] Phone: [phone]   Fax:  [phone]  Name: [name] MRN: [id number] Date of Birth: March 19, 1946  PHYSICAL THERAPY DISCHARGE SUMMARY  Visits from Start of Care: 7  Current functional level related to goals / functional outcomes:     PT Long Term Goals - 03/29/16 1138      PT LONG TERM GOAL #1   Title Pt will be IND In HEP to improve dizziness, balance, and gait. TARGET DATE FOR ALL LTGS: 03/28/16   Status Achieved     PT LONG TERM GOAL #2   Title Perform FGA and write goal as indicated.   Baseline Scored 15/30 on FGA on 03/14/2016   Status Achieved     PT LONG TERM GOAL #3   Title Pt will report average dizziness will be </=3/10 in order to perform ADLs in safe manner.    Status Not Met     PT LONG TERM GOAL #4   Title Pt will amb. 1000' over even/uneven terrain, IND, while performing head turns without dizziness incr. >/=1 point in order to improve functional mobility and safely garden.    Status Partially Met     PT LONG TERM GOAL #5   Title Pt will improve DHI score from 78% to 60% to improve quality of life.   Status Achieved     PT LONG TERM GOAL #6   Title Improve FGA from 15/30 up to 21/30 to demo improving dynamic mobility.   Status Partially Met        Remaining deficits: Blurred vision and intermittent shuffling gait.   Education / Equipment: HEP and PT encouraged pt to f/u with MD regarding blurred vision (pt has  hx of impaired vision).  Plan: Patient agrees to discharge.  Patient goals were partially met. Patient is being discharged due to being pleased with the current functional level.  ?????         Geoffry Paradise, PT,DPT 03/29/16 12:07 PM Phone: [phone] Fax: [phone]

## 2016-04-02 ENCOUNTER — Ambulatory Visit: Payer: Medicare Other | Admitting: Rehabilitative and Restorative Service Providers"

## 2016-04-04 ENCOUNTER — Encounter: Payer: Medicare Other | Admitting: Rehabilitative and Restorative Service Providers"

## 2016-04-17 ENCOUNTER — Ambulatory Visit (INDEPENDENT_AMBULATORY_CARE_PROVIDER_SITE_OTHER): Payer: Medicare Other | Admitting: *Deleted

## 2016-04-17 DIAGNOSIS — J309 Allergic rhinitis, unspecified: Secondary | ICD-10-CM

## 2016-04-24 DIAGNOSIS — J3089 Other allergic rhinitis: Secondary | ICD-10-CM | POA: Diagnosis not present

## 2016-04-25 DIAGNOSIS — J301 Allergic rhinitis due to pollen: Secondary | ICD-10-CM | POA: Diagnosis not present

## 2016-05-01 ENCOUNTER — Other Ambulatory Visit: Payer: Self-pay | Admitting: *Deleted

## 2016-05-01 ENCOUNTER — Encounter: Payer: Self-pay | Admitting: Internal Medicine

## 2016-05-01 MED ORDER — POTASSIUM CHLORIDE CRYS ER 10 MEQ PO TBCR: 10.0000 meq | EXTENDED_RELEASE_TABLET | Freq: Every day | ORAL | 3 refills | Status: DC

## 2016-05-03 ENCOUNTER — Ambulatory Visit (INDEPENDENT_AMBULATORY_CARE_PROVIDER_SITE_OTHER): Payer: Medicare Other | Admitting: *Deleted

## 2016-05-03 DIAGNOSIS — J309 Allergic rhinitis, unspecified: Secondary | ICD-10-CM | POA: Diagnosis not present

## 2016-05-04 DIAGNOSIS — S61216A Laceration without foreign body of right little finger without damage to nail, initial encounter: Secondary | ICD-10-CM | POA: Diagnosis not present

## 2016-05-04 DIAGNOSIS — W208XXA Other cause of strike by thrown, projected or falling object, initial encounter: Secondary | ICD-10-CM | POA: Diagnosis not present

## 2016-05-15 DIAGNOSIS — Z4802 Encounter for removal of sutures: Secondary | ICD-10-CM | POA: Diagnosis not present

## 2016-05-15 DIAGNOSIS — Z6825 Body mass index (BMI) 25.0-25.9, adult: Secondary | ICD-10-CM | POA: Diagnosis not present

## 2016-05-15 DIAGNOSIS — S61216A Laceration without foreign body of right little finger without damage to nail, initial encounter: Secondary | ICD-10-CM | POA: Diagnosis not present

## 2016-05-22 ENCOUNTER — Ambulatory Visit: Payer: Medicare Other | Admitting: Neurology

## 2016-05-28 ENCOUNTER — Ambulatory Visit (INDEPENDENT_AMBULATORY_CARE_PROVIDER_SITE_OTHER): Payer: Medicare Other | Admitting: *Deleted

## 2016-05-28 DIAGNOSIS — I442 Atrioventricular block, complete: Secondary | ICD-10-CM

## 2016-06-01 LAB — CUP PACEART REMOTE DEVICE CHECK
Battery Remaining Longevity: 74 mo
Battery Voltage: 3.01 V
Brady Statistic AS VP Percent: 91.28 %
Brady Statistic RA Percent Paced: 8.7 %
Date Time Interrogation Session: 20180115162305
Implantable Lead Location: 753860
Implantable Lead Model: 5076
Implantable Pulse Generator Implant Date: 20140818
Lead Channel Impedance Value: 323 Ohm
Lead Channel Pacing Threshold Amplitude: 1.125 V
Lead Channel Pacing Threshold Pulse Width: 0.4 ms
Lead Channel Pacing Threshold Pulse Width: 0.4 ms
Lead Channel Sensing Intrinsic Amplitude: 1.75 mV
Lead Channel Sensing Intrinsic Amplitude: 1.75 mV
MDC IDC LEAD IMPLANT DT: 20140818
MDC IDC LEAD IMPLANT DT: 20140818
MDC IDC LEAD LOCATION: 753859
MDC IDC MSMT LEADCHNL RA IMPEDANCE VALUE: 475 Ohm
MDC IDC MSMT LEADCHNL RV IMPEDANCE VALUE: 665 Ohm
MDC IDC MSMT LEADCHNL RV IMPEDANCE VALUE: 722 Ohm
MDC IDC MSMT LEADCHNL RV PACING THRESHOLD AMPLITUDE: 0.5 V
MDC IDC MSMT LEADCHNL RV SENSING INTR AMPL: 6.375 mV
MDC IDC MSMT LEADCHNL RV SENSING INTR AMPL: 6.375 mV
MDC IDC SET LEADCHNL RA PACING AMPLITUDE: 2.5 V
MDC IDC SET LEADCHNL RV PACING AMPLITUDE: 2.5 V
MDC IDC SET LEADCHNL RV PACING PULSEWIDTH: 0.4 ms
MDC IDC SET LEADCHNL RV SENSING SENSITIVITY: 0.9 mV
MDC IDC STAT BRADY AP VP PERCENT: 8.7 %
MDC IDC STAT BRADY AP VS PERCENT: 0 %
MDC IDC STAT BRADY AS VS PERCENT: 0.02 %
MDC IDC STAT BRADY RV PERCENT PACED: 99.98 %

## 2016-06-01 NOTE — Progress Notes (Signed)
Remote pacemaker transmission.   

## 2016-06-06 ENCOUNTER — Encounter: Payer: Self-pay | Admitting: Cardiology

## 2016-06-21 ENCOUNTER — Ambulatory Visit (INDEPENDENT_AMBULATORY_CARE_PROVIDER_SITE_OTHER): Payer: Medicare Other

## 2016-06-21 DIAGNOSIS — J309 Allergic rhinitis, unspecified: Secondary | ICD-10-CM

## 2016-06-28 ENCOUNTER — Ambulatory Visit (INDEPENDENT_AMBULATORY_CARE_PROVIDER_SITE_OTHER): Payer: Medicare Other

## 2016-06-28 DIAGNOSIS — J309 Allergic rhinitis, unspecified: Secondary | ICD-10-CM

## 2016-07-05 ENCOUNTER — Ambulatory Visit (INDEPENDENT_AMBULATORY_CARE_PROVIDER_SITE_OTHER): Payer: Medicare Other | Admitting: *Deleted

## 2016-07-05 DIAGNOSIS — J309 Allergic rhinitis, unspecified: Secondary | ICD-10-CM | POA: Diagnosis not present

## 2016-07-10 NOTE — Addendum Note (Signed)
Addended by: Felipa Emory on: 07/10/2016 11:47 AM   Modules accepted: Orders

## 2016-07-19 ENCOUNTER — Ambulatory Visit (INDEPENDENT_AMBULATORY_CARE_PROVIDER_SITE_OTHER): Payer: Medicare Other

## 2016-07-19 DIAGNOSIS — J309 Allergic rhinitis, unspecified: Secondary | ICD-10-CM | POA: Diagnosis not present

## 2016-07-19 DIAGNOSIS — H90A31 Mixed conductive and sensorineural hearing loss, unilateral, right ear with restricted hearing on the contralateral side: Secondary | ICD-10-CM | POA: Diagnosis not present

## 2016-07-19 DIAGNOSIS — H90A22 Sensorineural hearing loss, unilateral, left ear, with restricted hearing on the contralateral side: Secondary | ICD-10-CM | POA: Diagnosis not present

## 2016-08-02 ENCOUNTER — Ambulatory Visit (INDEPENDENT_AMBULATORY_CARE_PROVIDER_SITE_OTHER): Payer: Medicare Other | Admitting: *Deleted

## 2016-08-02 DIAGNOSIS — J309 Allergic rhinitis, unspecified: Secondary | ICD-10-CM

## 2016-08-15 DIAGNOSIS — H9311 Tinnitus, right ear: Secondary | ICD-10-CM | POA: Diagnosis not present

## 2016-08-15 DIAGNOSIS — H903 Sensorineural hearing loss, bilateral: Secondary | ICD-10-CM | POA: Diagnosis not present

## 2016-08-20 ENCOUNTER — Encounter: Payer: Self-pay | Admitting: Internal Medicine

## 2016-08-21 ENCOUNTER — Encounter: Payer: Self-pay | Admitting: Internal Medicine

## 2016-08-22 DIAGNOSIS — Z79899 Other long term (current) drug therapy: Secondary | ICD-10-CM | POA: Diagnosis not present

## 2016-08-22 DIAGNOSIS — E038 Other specified hypothyroidism: Secondary | ICD-10-CM | POA: Diagnosis not present

## 2016-08-27 ENCOUNTER — Telehealth: Payer: Self-pay | Admitting: Cardiology

## 2016-08-27 ENCOUNTER — Ambulatory Visit (INDEPENDENT_AMBULATORY_CARE_PROVIDER_SITE_OTHER): Payer: Medicare Other | Admitting: *Deleted

## 2016-08-27 DIAGNOSIS — I442 Atrioventricular block, complete: Secondary | ICD-10-CM | POA: Diagnosis not present

## 2016-08-27 DIAGNOSIS — H8193 Unspecified disorder of vestibular function, bilateral: Secondary | ICD-10-CM | POA: Diagnosis not present

## 2016-08-27 DIAGNOSIS — M545 Low back pain: Secondary | ICD-10-CM | POA: Diagnosis not present

## 2016-08-27 DIAGNOSIS — R200 Anesthesia of skin: Secondary | ICD-10-CM | POA: Diagnosis not present

## 2016-08-27 DIAGNOSIS — Z1389 Encounter for screening for other disorder: Secondary | ICD-10-CM | POA: Diagnosis not present

## 2016-08-27 DIAGNOSIS — M25562 Pain in left knee: Secondary | ICD-10-CM | POA: Diagnosis not present

## 2016-08-27 DIAGNOSIS — J45909 Unspecified asthma, uncomplicated: Secondary | ICD-10-CM | POA: Diagnosis not present

## 2016-08-27 DIAGNOSIS — E038 Other specified hypothyroidism: Secondary | ICD-10-CM | POA: Diagnosis not present

## 2016-08-27 DIAGNOSIS — M5412 Radiculopathy, cervical region: Secondary | ICD-10-CM | POA: Diagnosis not present

## 2016-08-27 DIAGNOSIS — R260 Ataxic gait: Secondary | ICD-10-CM | POA: Diagnosis not present

## 2016-08-27 DIAGNOSIS — M25552 Pain in left hip: Secondary | ICD-10-CM | POA: Diagnosis not present

## 2016-08-27 DIAGNOSIS — Z0000 Encounter for general adult medical examination without abnormal findings: Secondary | ICD-10-CM | POA: Diagnosis not present

## 2016-08-27 DIAGNOSIS — Z1212 Encounter for screening for malignant neoplasm of rectum: Secondary | ICD-10-CM | POA: Diagnosis not present

## 2016-08-27 DIAGNOSIS — D692 Other nonthrombocytopenic purpura: Secondary | ICD-10-CM | POA: Diagnosis not present

## 2016-08-27 DIAGNOSIS — M7662 Achilles tendinitis, left leg: Secondary | ICD-10-CM | POA: Diagnosis not present

## 2016-08-27 NOTE — Telephone Encounter (Signed)
Spoke with pt and reminded pt of remote transmission that is due today. Pt verbalized understanding.   

## 2016-08-28 LAB — CUP PACEART REMOTE DEVICE CHECK
Battery Voltage: 3.01 V
Brady Statistic AP VP Percent: 12.15 %
Brady Statistic AS VP Percent: 87.83 %
Brady Statistic AS VS Percent: 0.02 %
Implantable Lead Implant Date: 20140818
Implantable Lead Model: 5076
Lead Channel Impedance Value: 323 Ohm
Lead Channel Impedance Value: 456 Ohm
Lead Channel Impedance Value: 570 Ohm
Lead Channel Pacing Threshold Amplitude: 0.625 V
Lead Channel Pacing Threshold Amplitude: 1.25 V
Lead Channel Pacing Threshold Pulse Width: 0.4 ms
Lead Channel Sensing Intrinsic Amplitude: 6 mV
Lead Channel Setting Pacing Amplitude: 2.5 V
MDC IDC LEAD IMPLANT DT: 20140818
MDC IDC LEAD LOCATION: 753859
MDC IDC LEAD LOCATION: 753860
MDC IDC MSMT BATTERY REMAINING LONGEVITY: 73 mo
MDC IDC MSMT LEADCHNL RA PACING THRESHOLD PULSEWIDTH: 0.4 ms
MDC IDC MSMT LEADCHNL RA SENSING INTR AMPL: 2.25 mV
MDC IDC MSMT LEADCHNL RA SENSING INTR AMPL: 2.25 mV
MDC IDC MSMT LEADCHNL RV IMPEDANCE VALUE: 608 Ohm
MDC IDC MSMT LEADCHNL RV SENSING INTR AMPL: 6 mV
MDC IDC PG IMPLANT DT: 20140818
MDC IDC SESS DTM: 20180416210626
MDC IDC SET LEADCHNL RV PACING AMPLITUDE: 2.5 V
MDC IDC SET LEADCHNL RV PACING PULSEWIDTH: 0.4 ms
MDC IDC SET LEADCHNL RV SENSING SENSITIVITY: 0.9 mV
MDC IDC STAT BRADY AP VS PERCENT: 0 %
MDC IDC STAT BRADY RA PERCENT PACED: 12.15 %
MDC IDC STAT BRADY RV PERCENT PACED: 99.98 %

## 2016-08-28 NOTE — Progress Notes (Signed)
Remote pacemaker transmission.   

## 2016-08-29 ENCOUNTER — Encounter: Payer: Self-pay | Admitting: Cardiology

## 2016-08-29 ENCOUNTER — Ambulatory Visit (INDEPENDENT_AMBULATORY_CARE_PROVIDER_SITE_OTHER): Payer: Medicare Other

## 2016-08-29 ENCOUNTER — Other Ambulatory Visit (HOSPITAL_COMMUNITY): Payer: Self-pay | Admitting: Otolaryngology

## 2016-08-29 DIAGNOSIS — J309 Allergic rhinitis, unspecified: Secondary | ICD-10-CM | POA: Diagnosis not present

## 2016-08-29 DIAGNOSIS — H9311 Tinnitus, right ear: Secondary | ICD-10-CM

## 2016-09-04 DIAGNOSIS — Z853 Personal history of malignant neoplasm of breast: Secondary | ICD-10-CM | POA: Diagnosis not present

## 2016-09-04 DIAGNOSIS — R922 Inconclusive mammogram: Secondary | ICD-10-CM | POA: Diagnosis not present

## 2016-09-08 ENCOUNTER — Ambulatory Visit (HOSPITAL_COMMUNITY): Payer: Medicare Other

## 2016-09-11 DIAGNOSIS — G903 Multi-system degeneration of the autonomic nervous system: Secondary | ICD-10-CM | POA: Diagnosis not present

## 2016-09-12 ENCOUNTER — Ambulatory Visit (HOSPITAL_COMMUNITY)
Admission: RE | Admit: 2016-09-12 | Discharge: 2016-09-12 | Disposition: A | Discharge status: Home / Self Care | Payer: Medicare Other | Source: Ambulatory Visit | Attending: Otolaryngology | Admitting: Otolaryngology

## 2016-09-12 DIAGNOSIS — H9311 Tinnitus, right ear: Secondary | ICD-10-CM | POA: Diagnosis not present

## 2016-09-12 DIAGNOSIS — H9193 Unspecified hearing loss, bilateral: Secondary | ICD-10-CM | POA: Diagnosis not present

## 2016-09-12 LAB — CREATININE, SERUM
Creatinine, Ser: 0.77 mg/dL (ref 0.44–1.00)
GFR calc non Af Amer: >60 mL/min (ref >60)

## 2016-09-12 MED ORDER — GADOBENATE DIMEGLUMINE 529 MG/ML IV SOLN
15.0000 mL | Freq: Once | INTRAVENOUS | Status: AC | PRN
  Administered 2016-09-12: 15 mL via INTRAVENOUS

## 2016-09-18 ENCOUNTER — Ambulatory Visit (INDEPENDENT_AMBULATORY_CARE_PROVIDER_SITE_OTHER): Payer: Medicare Other

## 2016-09-18 DIAGNOSIS — J309 Allergic rhinitis, unspecified: Secondary | ICD-10-CM | POA: Diagnosis not present

## 2016-10-16 ENCOUNTER — Ambulatory Visit (INDEPENDENT_AMBULATORY_CARE_PROVIDER_SITE_OTHER): Payer: Medicare Other | Admitting: *Deleted

## 2016-10-16 DIAGNOSIS — J309 Allergic rhinitis, unspecified: Secondary | ICD-10-CM

## 2016-10-26 ENCOUNTER — Encounter: Payer: Self-pay | Admitting: Genetics

## 2016-10-26 NOTE — Progress Notes (Signed)
Amendment: The date of this amended report is October 19, 2016.  Based on the ACMG standards and guidelines for the interpretation of sequence variants ([place] 2015) that utilizes a combination of sources, e.g., internal data, published literature, population databases and in silico models, the AXIN2, c.2272G>A (p.Ala758Thr) VUS has been reclassified to Likely Benign.  The classification of the pathogenic CHEK2 c.1486C>T mutations has not been altered.

## 2016-10-30 NOTE — Progress Notes (Signed)
VIALS EXP 11-02-17  HC/JM

## 2016-11-01 DIAGNOSIS — J3089 Other allergic rhinitis: Secondary | ICD-10-CM | POA: Diagnosis not present

## 2016-11-02 DIAGNOSIS — J301 Allergic rhinitis due to pollen: Secondary | ICD-10-CM | POA: Diagnosis not present

## 2016-11-15 ENCOUNTER — Ambulatory Visit (INDEPENDENT_AMBULATORY_CARE_PROVIDER_SITE_OTHER): Payer: Medicare Other | Admitting: *Deleted

## 2016-11-15 DIAGNOSIS — J309 Allergic rhinitis, unspecified: Secondary | ICD-10-CM | POA: Diagnosis not present

## 2016-11-26 ENCOUNTER — Telehealth: Payer: Self-pay | Admitting: Cardiology

## 2016-11-26 ENCOUNTER — Ambulatory Visit (INDEPENDENT_AMBULATORY_CARE_PROVIDER_SITE_OTHER): Payer: Medicare Other | Admitting: *Deleted

## 2016-11-26 DIAGNOSIS — I442 Atrioventricular block, complete: Secondary | ICD-10-CM

## 2016-11-26 NOTE — Telephone Encounter (Signed)
Spoke with pt and reminded pt of remote transmission that is due today. Pt verbalized understanding.   

## 2016-11-26 NOTE — Progress Notes (Signed)
Remote pacemaker transmission.   

## 2016-11-27 LAB — CUP PACEART REMOTE DEVICE CHECK
Brady Statistic AP VS Percent: 0 %
Brady Statistic AS VP Percent: 86.77 %
Brady Statistic AS VS Percent: 0.02 %
Implantable Lead Location: 753860
Implantable Lead Model: 5076
Lead Channel Impedance Value: 342 Ohm
Lead Channel Pacing Threshold Pulse Width: 0.4 ms
Lead Channel Pacing Threshold Pulse Width: 0.4 ms
Lead Channel Sensing Intrinsic Amplitude: 2.125 mV
Lead Channel Sensing Intrinsic Amplitude: 6 mV
Lead Channel Sensing Intrinsic Amplitude: 6 mV
Lead Channel Setting Pacing Amplitude: 2.5 V
Lead Channel Setting Pacing Pulse Width: 0.4 ms
MDC IDC LEAD IMPLANT DT: 20140818
MDC IDC LEAD IMPLANT DT: 20140818
MDC IDC LEAD LOCATION: 753859
MDC IDC MSMT BATTERY REMAINING LONGEVITY: 70 mo
MDC IDC MSMT BATTERY VOLTAGE: 3 V
MDC IDC MSMT LEADCHNL RA IMPEDANCE VALUE: 475 Ohm
MDC IDC MSMT LEADCHNL RA PACING THRESHOLD AMPLITUDE: 1.375 V
MDC IDC MSMT LEADCHNL RA SENSING INTR AMPL: 2.125 mV
MDC IDC MSMT LEADCHNL RV IMPEDANCE VALUE: 684 Ohm
MDC IDC MSMT LEADCHNL RV IMPEDANCE VALUE: 760 Ohm
MDC IDC MSMT LEADCHNL RV PACING THRESHOLD AMPLITUDE: 0.5 V
MDC IDC PG IMPLANT DT: 20140818
MDC IDC SESS DTM: 20180716161526
MDC IDC SET LEADCHNL RA PACING AMPLITUDE: 2.75 V
MDC IDC SET LEADCHNL RV SENSING SENSITIVITY: 0.9 mV
MDC IDC STAT BRADY AP VP PERCENT: 13.21 %
MDC IDC STAT BRADY RA PERCENT PACED: 13.21 %
MDC IDC STAT BRADY RV PERCENT PACED: 99.97 %

## 2016-11-28 ENCOUNTER — Encounter: Payer: Self-pay | Admitting: Cardiology

## 2016-12-12 ENCOUNTER — Encounter: Payer: Self-pay | Admitting: Cardiology

## 2016-12-12 DIAGNOSIS — L570 Actinic keratosis: Secondary | ICD-10-CM | POA: Diagnosis not present

## 2016-12-12 DIAGNOSIS — L298 Other pruritus: Secondary | ICD-10-CM | POA: Diagnosis not present

## 2016-12-12 DIAGNOSIS — L821 Other seborrheic keratosis: Secondary | ICD-10-CM | POA: Diagnosis not present

## 2016-12-12 DIAGNOSIS — K130 Diseases of lips: Secondary | ICD-10-CM | POA: Diagnosis not present

## 2016-12-21 ENCOUNTER — Other Ambulatory Visit: Payer: Self-pay | Admitting: Allergy

## 2016-12-21 ENCOUNTER — Ambulatory Visit (INDEPENDENT_AMBULATORY_CARE_PROVIDER_SITE_OTHER): Payer: Medicare Other

## 2016-12-21 DIAGNOSIS — J309 Allergic rhinitis, unspecified: Secondary | ICD-10-CM | POA: Diagnosis not present

## 2016-12-21 NOTE — Progress Notes (Signed)
Pt came in for her routine allergy shot injections at red vial given decreased dose of 0.4 today as she had large local reaction following last injection at 0.5).  She receives both injections in same arm due to history of mastectomy and unable to get injection in right arm.  With both last injection and today she has had about quarter size wheal with flaring.  I was called as pt left after injection and sat in the car but came back into office as she felt dizzy and lightheaded about 10 minutes following her injection.   Vitals were obtained initially and was 92/60, 98%, 80 hr and 18 rr.  She was given 20ml of zyrtec.  She had repeat vitals about 10 minutes later with bp of 130/80.  I arrived to [facility] office from [facility] office and spoke/examined pt.  She reports she was feeling better and report she was no longer dizzy.  She denies any respiratory, GI symptoms and had no evidence of rash.  She had normal exam without wheezing.  After further discussion she states she had a dizzy episode last night as well after bringing in the trash last night.  She rested a bit and symptoms resolved.  She states she felt the same way after her injection as she did last night. She denies any medication changes and no recent illnesses.   She also has long history of dizziness, presyncope and orthostatic hypotension and extensive cardiac history including pacemaker placement. She was due for her afternoon midodrine dose and ok'd pt to take this which she did.  Vitals were obtained again and was 128/78, 96%, 68 hr and 18 rr.   She again reported she was feeling better.  She was monitored for approx 1.5 hr duration.  She has not been seen for OV in 2 years and thus will not give further injections until she has been seen.  She has appt for 8/21.   Signs/sx discussed of reasons to use Epipen which she does have at home.  Advised she can ice her arm to help with redness/swelling.  She will continue her routine antihistamine this  evening.  Also advised call on-call if she is having any further symptoms.

## 2017-01-01 ENCOUNTER — Ambulatory Visit (INDEPENDENT_AMBULATORY_CARE_PROVIDER_SITE_OTHER): Payer: Medicare Other | Admitting: Allergy and Immunology

## 2017-01-01 ENCOUNTER — Encounter: Payer: Self-pay | Admitting: Allergy and Immunology

## 2017-01-01 VITALS — BP 108/68 | HR 75 | Resp 18 | Ht 68.0 in | Wt 164.0 lb

## 2017-01-01 DIAGNOSIS — J3089 Other allergic rhinitis: Secondary | ICD-10-CM

## 2017-01-01 DIAGNOSIS — K219 Gastro-esophageal reflux disease without esophagitis: Secondary | ICD-10-CM

## 2017-01-01 DIAGNOSIS — J45909 Unspecified asthma, uncomplicated: Secondary | ICD-10-CM

## 2017-01-01 MED ORDER — RANITIDINE HCL 300 MG PO CAPS: 300.0000 mg | ORAL_CAPSULE | Freq: Every evening | ORAL | 5 refills | Status: DC

## 2017-01-01 MED ORDER — MONTELUKAST SODIUM 10 MG PO TABS: 10.0000 mg | ORAL_TABLET | Freq: Every day | ORAL | 5 refills | Status: DC

## 2017-01-01 NOTE — Progress Notes (Signed)
Follow-up Note  Referring Provider: Burnard Bunting, MD Primary Provider: Burnard Bunting, MD Date of Office Visit: 01/01/2017  Subjective:   [name] (DOB: 08/07/1945) is a 71 y.o. female who returns to the [facility] on 01/01/2017 in re-evaluation of the following:  HPI: [name] presents to this clinic in evaluation of 2 main issues.  First, for her allergic rhinoconjunctivitis she has been receiving immunotherapy and had a reaction during her last immunotherapy injection. On the fifth dose of her new vial at 0.4 ML's she return to this clinic about 25-30 minutes after receiving her injection with a low blood pressure and feeling dizzy and having a swollen area on her arm and some itchiness. She had no associated GI symptoms or respiratory tract symptoms. She was evaluated in this clinic at that point and it did not appear as though she was having a anaphylactic reaction and she was given some antihistamines and she did well within an hour. It should be noted that she has been having large local reactions to her immunotherapy that last up to 24 hours even while using Zyrtec in the morning and Claritin at night. She is on every three-week administration of her maintenance dose of immunotherapy. There was no obvious provoking factor for why she had a hypersensitivity reaction with her last injection. She did not have any evidence of a infectious disease and she has not really had any significant environmental change that has occurred. She does have a history of some form of autonomic dysfunction with some degree of hemodynamic instability documented in the past. She also has a very distant history of asthma that has been inactive for years.  Second, she relates a history of developing coughing and throat clearing and some occasional choking after she finishes dinner. She gags and almost vomits. This occurs while using Nexium 40 mg daily.  Allergies as of 01/01/2017       Reactions   Anesthetics, Amide Other (See Comments)   Patient unsure of names, however multiples cause swelling of airway & nausea   Clindamycin Swelling   Neomycin Swelling   Shellfish Allergy Other (See Comments)   Neurotoxic reaction   Sulfonamide Derivatives Anaphylaxis, Swelling   Zolpidem Tartrate Other (See Comments)   Jerking motions    Azithromycin Other (See Comments)   Severe gastritis    Bactroban Other (See Comments)   Causes sores in nose   Ciprofloxacin    REACTION: joint swelling   Codeine Swelling   Meperidine Hcl Nausea Only   Hallucinations   Morphine Nausea Only   Hallucinations    Neosporin [neomycin-polymyxin-gramicidin] Hives, Dermatitis   All topical "orin's ointment"   Nitrofurantoin    REACTION: neuropathy in legs   Other    ALLERGIC TO WARM WATER SHELLFISH PT STATES PLEASE NOTE SHE CAN TAKE OXYCODONE AND TYLENOL FOR PAIN -NOT ALLERGIC   Penicillins Other (See Comments)   Swelling in joints   Tramadol    Makes jerk   Latex Rash      Medication List      acetaminophen 325 MG tablet Commonly known as:  TYLENOL Take 2 tablets (650 mg total) by mouth every 6 (six) hours as needed for mild pain.   albuterol 108 (90 Base) MCG/ACT inhaler Commonly known as:  PROVENTIL HFA;VENTOLIN HFA Inhale 2 puffs into the lungs every 6 (six) hours as needed. For shortness of breath   ALIGN 4 MG Caps Take 4 mg by mouth at bedtime.  b complex vitamins capsule Take 1 capsule by mouth daily.   cephALEXin 500 MG capsule Commonly known as:  KEFLEX TAKE 4 CAPSULES BY MOUTH 1 HOUR PRIOR TO APPOINTMENT   cetirizine 10 MG tablet Commonly known as:  ZYRTEC Take by mouth.   cholecalciferol 1000 units tablet Commonly known as:  VITAMIN D Take 1,000 Units by mouth daily.   ciclesonide 50 MCG/ACT nasal spray Commonly known as:  OMNARIS Place 2 sprays into both nostrils as needed for allergies.   cycloSPORINE 0.05 % ophthalmic emulsion Commonly known  as:  RESTASIS Apply to eye.   DULERA 200-5 MCG/ACT Aero Generic drug:  mometasone-formoterol Inhale 2 puffs into the lungs 2 (two) times daily as needed.   EPINEPHrine 0.3 mg/0.3 mL Soaj injection Commonly known as:  EPIPEN 2-PAK Inject 0.3 mLs (0.3 mg total) into the muscle once.   escitalopram 10 MG tablet Commonly known as:  LEXAPRO Take 10 mg by mouth daily.   esomeprazole 40 MG capsule Commonly known as:  NEXIUM Take 1 capsule (40 mg total) by mouth daily.   estradiol 0.025 MG/24HR Commonly known as:  VIVELLE-DOT Place 1 patch onto the skin 2 (two) times a week. Applies new patch on Sunday & Thursday   estradiol 2 MG vaginal ring Commonly known as:  ESTRING Place 2 mg vaginally every 3 (three) months. follow package directions   fluocinonide 0.05 % external solution Commonly known as:  LIDEX 1 APPLICATION ON THE SKIN AS DIRECTED. APPLY TO SCALP BID FOR 1-2 WEEKS PRN FLARE AS DIRECTED   levothyroxine 88 MCG tablet Commonly known as:  SYNTHROID, LEVOTHROID Take 100 mcg by mouth daily before breakfast.   meclizine 25 MG tablet Commonly known as:  ANTIVERT Take 0.5 tablets (12.5 mg total) by mouth 3 (three) times daily.   Melatonin 1 MG Tabs Take 1.5 tablets by mouth at bedtime as needed.   midodrine 2.5 MG tablet Commonly known as:  PROAMATINE Take 1 tablet (2.5 mg total) by mouth 3 (three) times daily with meals.   ondansetron 4 MG disintegrating tablet Commonly known as:  ZOFRAN ODT Take 1 tablet (4 mg total) by mouth every 8 (eight) hours as needed for nausea or vomiting.   potassium chloride 10 MEQ tablet Commonly known as:  KLOR-CON M10 Take 1 tablet (10 mEq total) by mouth daily.   PRESCRIPTION MEDICATION ALLERGY SHOT EVERY 2 WEEKS BY DR. Neldon Mc   vitamin C 500 MG tablet Commonly known as:  ASCORBIC ACID Take by mouth.       Past Medical History:  Diagnosis Date  . ADHD (attention deficit hyperactivity disorder)   . Allergy    trees/pollen,  mold, fungus, dust mites. Takes allergy shots  . Arthritis    PAIN AND OA LEFT HIP  . Asthma    allergist Dr Olena Heckle- monthly allergy injections  . Autonomic dysfunction    CENTRAL NERVOUS SYSTEM NEUROPATHY - DX BY DR. Erling Cruz MORE THAN 10 YRS AGO - AND IT IS FELT TO CONTRIBUTE TO THE AUTOMIC  DYSFUNCTION-- PT HAS NUMBNESS LEGS AND FEET AND SOMETIIMES TIPS OF FINGER, SEVERE CONSTIPATION( NO SENSATION TO HAVE BM ), DOUBLE VISION, ORTHOSTATIC HYPOTENSION  . Blood transfusion   . Breast cancer ([place])    right side  . Bruises easily   . Cataract   . Chest pain    a. 12/2012 Cath: nl cors, EF 55-65%.  . Complication of anesthesia    reaction to some anesthetics/ 7/12 anesth record on chart- states prefers  epidural  . Depression   . Diverticulosis   . Dysrhythmia    HX OF HIGH GRADE HEART BLOCK - REQUIRED PACEMAKER INSERTION  . Esophageal stricture   . Gastritis   . GERD (gastroesophageal reflux disease)   . H/O hiatal hernia   . Hemorrhoids   . Hernia of abdominal wall    spigelian hernia RLQ - SURGERY TO REPAIR  . Hypothyroidism   . Interstitial cystitis   . Latex allergy, contact dermatitis   . Mallory - Weiss tear    HEALED   . Neuromuscular disorder (HCC)    central nervous system neuropathy- seen per Dr Erling Cruz  . Pacemaker   . Pericardial effusion    a. 12/2012 following ppm placement;  b. 01/01/2013 Echo: EF 55-60%, small pericardial effusion w/o RV collapse-->No need for tap/window.  Marland Kitchen PONV (postoperative nausea and vomiting)    pt needs scop patch  . Recurrent upper respiratory infection (URI) 1/13- to present   bronchitis following surgery- states improved but still with cough. OV with Clearance Dr [name] 09/06/11 on chart  . Shortness of breath    AT TIMES - BUT MUCH IMPROVED AFTER PACEMAKER WAS REPROGRAMED.  Marland Kitchen Symptomatic bradycardia    a. 12/2012 s/p MDT dual chamber PPM, ser # [id number]; b. 12/2012 post-op course complicated by pericardial effusinon req lead revision.  .  Thyroid disease     Past Surgical History:  Procedure Laterality Date  . ABDOMINAL HYSTERECTOMY  1974  . BACK SURGERY     cervical fusion 4-5 with plate  . BLADDER SUSPENSION    . BREAST BIOPSY  2002   NO BLOOD PRESSURES ON RIGHT SIDE/   s/p  axillary node dissection  . CYSTO WITH HYDRODISTENSION  09/07/2011   Procedure: CYSTOSCOPY/HYDRODISTENSION;  Surgeon: Ailene Rud, MD;  Location: [facility];  Service: Urology;  Laterality: N/A;  INSTILLATION OF MARCAINE/PYRIDIUM INSTILLATION OF MARCAINE/KENALOG  . CYSTOSCOPY  1975, 2007  . EYE SURGERY     LASIK EYE SURGERY BILATERAL  . LAPAROSCOPY  1973  . LEAD REVISION N/A 12/30/2012   Procedure: LEAD REVISION;  Surgeon: Evans Lance, MD;  [facility] C[facility] LAB;  Service: Cardiovascular;  Laterality: N/A;  . LEFT HEART CATHETERIZATION WITH CORONARY ANGIOGRAM N/A 12/29/2012   Procedure: LEFT HEART CATHETERIZATION WITH CORONARY ANGIOGRAM;  Surgeon: Peter M Martinique, MD;  Location: [facility] CATH LAB;  Service: Cardiovascular;  Laterality: N/A;  . MASTECTOMY MODIFIED RADICAL     right; with immediate reconstruction  . MYRINGOPLASTY  1962  . NECK SURGERY     c4-5 ruptured disc  . OOPHORECTOMY  1982  . PACEMAKER INSERTION    . PERMANENT PACEMAKER INSERTION N/A 12/29/2012   Procedure: PERMANENT PACEMAKER INSERTION;  Surgeon: Deboraha Sprang, MD;  Location: [facility] CATH LAB;  Service: Cardiovascular;  Laterality: N/A;  . RECTOCELE REPAIR     with cystocele repair  . TONSILLECTOMY    . TOTAL HIP ARTHROPLASTY Right   . TOTAL HIP ARTHROPLASTY Left 06/12/2013   Procedure: LEFT TOTAL HIP ARTHROPLASTY ANTERIOR APPROACH;  Surgeon: Mcarthur Rossetti, MD;  Location: [facility];  Service: Orthopedics;  Laterality: Left;  . TYMPANOPLASTY  1973   right  . ULNAR NERVE TRANSPOSITION Right   . VENTRAL HERNIA REPAIR  05/30/2011   Procedure: HERNIA REPAIR VENTRAL ADULT;  Surgeon: Haywood Lasso, MD;  Location: [facility];  Service: General;  Laterality:  Right;  repair right spigelian hernia    Review of systems negative except as  noted in HPI / PMHx or noted below:  Review of Systems  Constitutional: Negative.   HENT: Negative.   Eyes: Negative.   Respiratory: Negative.   Cardiovascular: Negative.   Gastrointestinal: Negative.   Genitourinary: Negative.   Musculoskeletal: Negative.   Skin: Negative.   Neurological: Negative.   Endo/Heme/Allergies: Negative.   Psychiatric/Behavioral: Negative.      Objective:   Vitals:   01/01/17 1555  BP: 108/68  Pulse: 75  Resp: 18  SpO2: 96%   Height: 5\' 8"  (172.7 cm)  Weight: 164 lb (74.4 kg)   Physical Exam  Constitutional: She is well-developed, well-nourished, and in no distress.  HENT:  Head: Normocephalic.  Right Ear: Tympanic membrane, external ear and ear canal normal.  Left Ear: Tympanic membrane, external ear and ear canal normal.  Nose: Nose normal. No mucosal edema or rhinorrhea.  Mouth/Throat: Uvula is midline, oropharynx is clear and moist and mucous membranes are normal. No oropharyngeal exudate.  Eyes: Conjunctivae are normal.  Neck: Trachea normal. No tracheal tenderness present. No tracheal deviation present. No thyromegaly present.  Cardiovascular: Normal rate, regular rhythm, S1 normal, S2 normal and normal heart sounds.   No murmur heard. Pulmonary/Chest: Breath sounds normal. No stridor. No respiratory distress. She has no wheezes. She has no rales.  Musculoskeletal: She exhibits no edema.  Lymphadenopathy:       Head (right side): No tonsillar adenopathy present.       Head (left side): No tonsillar adenopathy present.    She has no cervical adenopathy.  Neurological: She is alert. Gait normal.  Skin: No rash noted. She is not diaphoretic. No erythema. Nails show no clubbing.  Psychiatric: Mood and affect normal.    Diagnostics:  none  Assessment and Plan:   1. Other allergic rhinitis   2. LPRD (laryngopharyngeal reflux disease)     1. Continue  Nexium 40 mg in a.m.  2. Start ranitidine 300 mg in PM  3. Increase cetirizine/Zyrtec to 10 mg twice a day  4. Start montelukast 10 mg one time per day  5. Decrease immunotherapy injection to 0.1 mL with next injection and continue at 0.1 mL every 3 weeks  6. Continue EpiPen if needed  7. Return to clinic in 12 weeks. Escalation of immunotherapy dose?  8. Obtain fall flu vaccine  I will have Joycelyn Schmid lower her dose of immunotherapy to 0.1 mL full-strength extract and keep her there for the next 12 weeks as well as having her increase her dose of cetirizine to twice a day and starting a leukotriene modifier in the hope of preventing her from developing large local reactions and also preventing her from developing systemic reactions with this administration. As well, she appears to have LPR which is a problem after eating and she will use a combination of Nexium and ranitidine. I will regroup with her in 12 weeks or earlier if there is a problem.  Allena Katz, MD Allergy / Immunology [place]

## 2017-01-01 NOTE — Patient Instructions (Addendum)
  1. Continue Nexium 40 mg in a.m.  2. Start ranitidine 300 mg in PM  3. Increase cetirizine/Zyrtec to 10 mg twice a day  4. Start montelukast 10 mg one time per day  5. Decrease immunotherapy injection to 0.1 mL with next injection and continue at 0.1 mL every 3 weeks  6. Continue EpiPen if needed  7. Return to clinic in 12 weeks. Escalation of immunotherapy dose?  8. Obtain fall flu vaccine

## 2017-01-03 ENCOUNTER — Encounter: Payer: Self-pay | Admitting: Allergy and Immunology

## 2017-01-03 ENCOUNTER — Other Ambulatory Visit: Payer: Self-pay | Admitting: *Deleted

## 2017-01-03 MED ORDER — ESOMEPRAZOLE MAGNESIUM 40 MG PO CPDR: 40.0000 mg | DELAYED_RELEASE_CAPSULE | Freq: Every day | ORAL | 1 refills | Status: DC

## 2017-01-08 DIAGNOSIS — N952 Postmenopausal atrophic vaginitis: Secondary | ICD-10-CM | POA: Diagnosis not present

## 2017-01-08 DIAGNOSIS — Z9189 Other specified personal risk factors, not elsewhere classified: Secondary | ICD-10-CM | POA: Diagnosis not present

## 2017-01-08 DIAGNOSIS — Z124 Encounter for screening for malignant neoplasm of cervix: Secondary | ICD-10-CM | POA: Diagnosis not present

## 2017-01-08 DIAGNOSIS — Z79890 Hormone replacement therapy (postmenopausal): Secondary | ICD-10-CM | POA: Diagnosis not present

## 2017-01-08 DIAGNOSIS — Z01419 Encounter for gynecological examination (general) (routine) without abnormal findings: Secondary | ICD-10-CM | POA: Diagnosis not present

## 2017-01-08 NOTE — Addendum Note (Signed)
Addended by: Lucrezia Starch I on: 01/08/2017 03:11 PM   Modules accepted: Orders

## 2017-01-17 ENCOUNTER — Ambulatory Visit (INDEPENDENT_AMBULATORY_CARE_PROVIDER_SITE_OTHER): Payer: Medicare Other | Admitting: *Deleted

## 2017-01-17 DIAGNOSIS — J309 Allergic rhinitis, unspecified: Secondary | ICD-10-CM

## 2017-02-11 ENCOUNTER — Ambulatory Visit (INDEPENDENT_AMBULATORY_CARE_PROVIDER_SITE_OTHER): Payer: Medicare Other | Admitting: *Deleted

## 2017-02-11 DIAGNOSIS — J309 Allergic rhinitis, unspecified: Secondary | ICD-10-CM

## 2017-02-13 ENCOUNTER — Other Ambulatory Visit (INDEPENDENT_AMBULATORY_CARE_PROVIDER_SITE_OTHER): Payer: Self-pay

## 2017-02-13 ENCOUNTER — Ambulatory Visit (INDEPENDENT_AMBULATORY_CARE_PROVIDER_SITE_OTHER): Payer: Medicare Other | Admitting: Orthopaedic Surgery

## 2017-02-13 ENCOUNTER — Ambulatory Visit (INDEPENDENT_AMBULATORY_CARE_PROVIDER_SITE_OTHER): Payer: Medicare Other

## 2017-02-13 DIAGNOSIS — M25511 Pain in right shoulder: Secondary | ICD-10-CM

## 2017-02-13 DIAGNOSIS — G8929 Other chronic pain: Secondary | ICD-10-CM | POA: Diagnosis not present

## 2017-02-13 DIAGNOSIS — M79641 Pain in right hand: Secondary | ICD-10-CM

## 2017-02-13 DIAGNOSIS — M6702 Short Achilles tendon (acquired), left ankle: Secondary | ICD-10-CM | POA: Insufficient documentation

## 2017-02-13 MED ORDER — METHYLPREDNISOLONE ACETATE 40 MG/ML IJ SUSP
40.0000 mg | INTRAMUSCULAR | Status: AC | PRN
  Administered 2017-02-13: 40 mg via INTRA_ARTICULAR

## 2017-02-13 MED ORDER — LIDOCAINE HCL 1 % IJ SOLN
3.0000 mL | INTRAMUSCULAR | Status: AC | PRN
  Administered 2017-02-13: 3 mL

## 2017-02-13 NOTE — Progress Notes (Signed)
Office Visit Note   Patient: [name]           Date of Birth: [birth year]           MRN: [id number] Visit Date: 02/13/2017              Requested by: Burnard Bunting, MD [address] [place], [place] [address] PCP: Burnard Bunting, MD   Assessment & Plan: Visit Diagnoses:  1. Chronic right shoulder pain   2. Short Achilles tendon (acquired), left ankle     Plan: She would definitely benefit from a steroid injection in her right shoulder subacromial outlet and she is agreeable to this. She understands risk and benefits of injections. She tolerated this well. I did show her stretching exercises for her Achilles which I think this will help as well. Before she left she did let me know about chronic Dupuytren's contracture she has of her right hand and would like to be seen by hand specialist for this. We will send her to the hand [facility] to Dr. Burney Gauze versus Dr. Fredna Dow depending on availability. She is interested in the hand therapy at their place as off her as well. I'll see her right myself in 4 weeks.  Follow-Up Instructions: Return in about 4 weeks (around 03/13/2017).   Orders:  Orders Placed This Encounter  Procedures  . Large Joint Injection/Arthrocentesis  . XR Shoulder Right   No orders of the defined types were placed in this encounter.     Procedures: Large Joint Inj Date/Time: 02/13/2017 11:21 AM Performed by: Mcarthur Rossetti Authorized by: Mcarthur Rossetti   Location:  Shoulder Site:  R subacromial bursa Ultrasound Guidance: No   Fluoroscopic Guidance: No   Arthrogram: No   Medications:  3 mL lidocaine 1 %; 40 mg methylPREDNISolone acetate 40 MG/ML     Clinical Data: No additional findings.   Subjective: No chief complaint on file. The patient is well-known to me. She comes in with chief complaint of right shoulder pain is been going on for just a few months now. She has a history of frozen shoulder years ago  and she said she is Moving so she will not develop a frozen shoulder but she's had pain with overhead activities and reaching behind her with no known injury. Started first a couple months ago wake her up at night and is gradually gotten worse. This is on the right side. The left ankle is been hurting around Achilles tendon and this been for a few weeks now. She does walk barefooted at home. She denies any significant injury to this area. She says sometimes it is a sharp pain in her ankle.  HPI  Review of Systems She denies any headache, chest pain, shortness of breath, fever, chills, nausea, vomiting.  Objective: Vital Signs: There were no vitals taken for this visit.  Physical Exam He is alert and oriented 3 and in no acute distress  Ortho Exam Examination of her right shoulder shows full range of motion with no weakness the rotator cuff. She has pain on extremes of abduction external rotation and signs of impingement with positive Neer and Hawkins signs. Examination of her left ankle shows pain over the Achilles tendon with full range of motion and a negative Thompson time. She's neurovascularly intact on the ankle.  Specialty Comments:  No specialty comments available.  Imaging: Xr Shoulder Right  Result Date: 02/13/2017 3 views of the right shoulder show well located shoulder with no  acute findings.    PMFS History: Patient Active Problem List   Diagnosis Date Noted  . Chronic right shoulder pain 02/13/2017  . Short Achilles tendon (acquired), left ankle 02/13/2017  . Dizziness and giddiness 02/16/2016  . Monoallelic mutation of CHEK2 gene 02/02/2015  . Genetic testing 01/31/2015  . Altered mental status 10/25/2014  . Occipital neuralgia of left side   . Stroke ([place]) 10/07/2014  . History of permanent cardiac pacemaker placement 10/07/2014  . Cephalalgia   . Complicated migraine   . HLD (hyperlipidemia)   . Headache 10/06/2014  . Arthritis of left hip 06/12/2013  .  Status post THR (total hip replacement) 06/12/2013  . Constipation 05/25/2013  . Orthostatic hypotension 01/27/2013  . crosstalk-atrial lead 01/27/2013  . Pre-syncope 01/02/2013  . Sinus tachycardia 01/01/2013  . Second degree heart block 12/27/2012  . Hypothyroidism 12/27/2012  . Spigelian hernia 04/20/2011  . DYSPNEA 05/09/2010  . BREAST CANCER 05/05/2010  . ASTHMA 05/05/2010  . HIATAL HERNIA 05/05/2010  . DEGENERATIVE Disk DISEASE 05/05/2010  . OSTEOPENIA 05/05/2010   Past Medical History:  Diagnosis Date  . ADHD (attention deficit hyperactivity disorder)   . Allergy    trees/pollen, mold, fungus, dust mites. Takes allergy shots  . Arthritis    PAIN AND OA LEFT HIP  . Asthma    allergist Dr Olena Heckle- monthly allergy injections  . Autonomic dysfunction    CENTRAL NERVOUS SYSTEM NEUROPATHY - DX BY DR. Erling Cruz MORE THAN 10 YRS AGO - AND IT IS FELT TO CONTRIBUTE TO THE AUTOMIC  DYSFUNCTION-- PT HAS NUMBNESS LEGS AND FEET AND SOMETIIMES TIPS OF FINGER, SEVERE CONSTIPATION( NO SENSATION TO HAVE BM ), DOUBLE VISION, ORTHOSTATIC HYPOTENSION  . Blood transfusion   . Breast cancer ([place])    right side  . Bruises easily   . Cataract   . Chest pain    a. 12/2012 Cath: nl cors, EF 55-65%.  . Complication of anesthesia    reaction to some anesthetics/ 7/12 anesth record on chart- states prefers epidural  . Depression   . Diverticulosis   . Dysrhythmia    HX OF HIGH GRADE HEART BLOCK - REQUIRED PACEMAKER INSERTION  . Esophageal stricture   . Gastritis   . GERD (gastroesophageal reflux disease)   . H/O hiatal hernia   . Hemorrhoids   . Hernia of abdominal wall    spigelian hernia RLQ - SURGERY TO REPAIR  . Hypothyroidism   . Interstitial cystitis   . Latex allergy, contact dermatitis   . Mallory - Weiss tear    HEALED   . Neuromuscular disorder (HCC)    central nervous system neuropathy- seen per Dr Erling Cruz  . Pacemaker   . Pericardial effusion    a. 12/2012 following ppm placement;   b. 01/01/2013 Echo: EF 55-60%, small pericardial effusion w/o RV collapse-->No need for tap/window.  Marland Kitchen PONV (postoperative nausea and vomiting)    pt needs scop patch  . Recurrent upper respiratory infection (URI) 1/13- to present   bronchitis following surgery- states improved but still with cough. OV with [facility] Dr [name] 09/06/11 on chart  . Shortness of breath    AT TIMES - BUT MUCH IMPROVED AFTER PACEMAKER WAS REPROGRAMED.  Marland Kitchen Symptomatic bradycardia    a. 12/2012 s/p MDT dual chamber PPM, ser # [id number]; b. 12/2012 post-op course complicated by pericardial effusinon req lead revision.  . Thyroid disease     Family History  Problem Relation Age of Onset  . Heart disease Father   .  Colon cancer Mother 94  . Depression Mother   . Pancreatic cancer Sister 18  . Diverticulitis Sister   . Uterine cancer Maternal Grandmother   . Heart disease Brother   . Heart disease Paternal Grandmother   . Heart disease Paternal Grandfather   . Throat cancer Maternal Uncle   . Heart disease Paternal Aunt   . Heart disease Paternal Uncle   . Heart disease Brother   . Thrombosis Maternal Aunt   . Cancer Maternal Uncle        NOS  . Diabetes Unknown        aunt    Past Surgical History:  Procedure Laterality Date  . ABDOMINAL HYSTERECTOMY  1974  . BACK SURGERY     cervical fusion 4-5 with plate  . BLADDER SUSPENSION    . BREAST BIOPSY  2002   NO BLOOD PRESSURES ON RIGHT SIDE/   s/p  axillary node dissection  . CYSTO WITH HYDRODISTENSION  09/07/2011   Procedure: CYSTOSCOPY/HYDRODISTENSION;  Surgeon: Ailene Rud, MD;  Location: [facility];  Service: Urology;  Laterality: N/A;  INSTILLATION OF MARCAINE/PYRIDIUM INSTILLATION OF MARCAINE/KENALOG  . CYSTOSCOPY  1975, 2007  . EYE SURGERY     LASIK EYE SURGERY BILATERAL  . LAPAROSCOPY  1973  . LEAD REVISION N/A 12/30/2012   Procedure: LEAD REVISION;  Surgeon: Evans Lance, MD;  Location: [facility] CATH LAB;  Service: Cardiovascular;  Laterality:  N/A;  . LEFT HEART CATHETERIZATION WITH CORONARY ANGIOGRAM N/A 12/29/2012   Procedure: LEFT HEART CATHETERIZATION WITH CORONARY ANGIOGRAM;  Surgeon: Peter M Martinique, MD;  Location: [facility] CATH LAB;  Service: Cardiovascular;  Laterality: N/A;  . MASTECTOMY MODIFIED RADICAL     right; with immediate reconstruction  . MYRINGOPLASTY  1962  . NECK SURGERY     c4-5 ruptured disc  . OOPHORECTOMY  1982  . PACEMAKER INSERTION    . PERMANENT PACEMAKER INSERTION N/A 12/29/2012   Procedure: PERMANENT PACEMAKER INSERTION;  Surgeon: Deboraha Sprang, MD;  Location: [facility] CATH LAB;  Service: Cardiovascular;  Laterality: N/A;  . RECTOCELE REPAIR     with cystocele repair  . TONSILLECTOMY    . TOTAL HIP ARTHROPLASTY Right   . TOTAL HIP ARTHROPLASTY Left 06/12/2013   Procedure: LEFT TOTAL HIP ARTHROPLASTY ANTERIOR APPROACH;  Surgeon: Mcarthur Rossetti, MD;  Location: [facility];  Service: Orthopedics;  Laterality: Left;  . TYMPANOPLASTY  1973   right  . ULNAR NERVE TRANSPOSITION Right   . VENTRAL HERNIA REPAIR  05/30/2011   Procedure: HERNIA REPAIR VENTRAL ADULT;  Surgeon: Haywood Lasso, MD;  Location: [place] [facility];  Service: General;  Laterality: Right;  repair right spigelian hernia   Social History   Occupational History  . Retired Engineer, manufacturing    [employer]  .  [employer]   Social History Main Topics  . Smoking status: Never Smoker  . Smokeless tobacco: Never Used  . Alcohol use 4.2 oz/week    7 Glasses of wine per week     Comment: occasional  . Drug use: No  . Sexual activity: Not on file

## 2017-02-25 ENCOUNTER — Ambulatory Visit (INDEPENDENT_AMBULATORY_CARE_PROVIDER_SITE_OTHER): Payer: Medicare Other | Admitting: *Deleted

## 2017-02-25 ENCOUNTER — Telehealth: Payer: Self-pay | Admitting: Cardiology

## 2017-02-25 DIAGNOSIS — D1801 Hemangioma of skin and subcutaneous tissue: Secondary | ICD-10-CM | POA: Diagnosis not present

## 2017-02-25 DIAGNOSIS — D2371 Other benign neoplasm of skin of right lower limb, including hip: Secondary | ICD-10-CM | POA: Diagnosis not present

## 2017-02-25 DIAGNOSIS — I442 Atrioventricular block, complete: Secondary | ICD-10-CM

## 2017-02-25 DIAGNOSIS — L821 Other seborrheic keratosis: Secondary | ICD-10-CM | POA: Diagnosis not present

## 2017-02-25 DIAGNOSIS — L570 Actinic keratosis: Secondary | ICD-10-CM | POA: Diagnosis not present

## 2017-02-25 DIAGNOSIS — L738 Other specified follicular disorders: Secondary | ICD-10-CM | POA: Diagnosis not present

## 2017-02-25 DIAGNOSIS — L812 Freckles: Secondary | ICD-10-CM | POA: Diagnosis not present

## 2017-02-25 DIAGNOSIS — L853 Xerosis cutis: Secondary | ICD-10-CM | POA: Diagnosis not present

## 2017-02-25 NOTE — Telephone Encounter (Signed)
Spoke with pt and reminded pt of remote transmission that is due today. Pt verbalized understanding.   

## 2017-02-26 NOTE — Progress Notes (Signed)
Remote pacemaker transmission.   

## 2017-02-27 LAB — CUP PACEART REMOTE DEVICE CHECK
Brady Statistic AP VP Percent: 14.59 %
Brady Statistic AP VS Percent: 0 %
Brady Statistic AS VP Percent: 85.39 %
Brady Statistic RA Percent Paced: 14.59 %
Implantable Lead Implant Date: 20140818
Implantable Lead Location: 753859
Implantable Lead Model: 5076
Implantable Lead Model: 5076
Lead Channel Impedance Value: 342 Ohm
Lead Channel Impedance Value: 684 Ohm
Lead Channel Pacing Threshold Pulse Width: 0.4 ms
Lead Channel Sensing Intrinsic Amplitude: 1.5 mV
Lead Channel Sensing Intrinsic Amplitude: 6 mV
Lead Channel Sensing Intrinsic Amplitude: 6 mV
Lead Channel Setting Pacing Amplitude: 2.25 V
Lead Channel Setting Pacing Amplitude: 2.5 V
Lead Channel Setting Pacing Pulse Width: 0.4 ms
MDC IDC LEAD IMPLANT DT: 20140818
MDC IDC LEAD LOCATION: 753860
MDC IDC MSMT BATTERY REMAINING LONGEVITY: 64 mo
MDC IDC MSMT BATTERY VOLTAGE: 3 V
MDC IDC MSMT LEADCHNL RA IMPEDANCE VALUE: 475 Ohm
MDC IDC MSMT LEADCHNL RA PACING THRESHOLD AMPLITUDE: 1.125 V
MDC IDC MSMT LEADCHNL RA PACING THRESHOLD PULSEWIDTH: 0.4 ms
MDC IDC MSMT LEADCHNL RA SENSING INTR AMPL: 1.5 mV
MDC IDC MSMT LEADCHNL RV IMPEDANCE VALUE: 703 Ohm
MDC IDC MSMT LEADCHNL RV PACING THRESHOLD AMPLITUDE: 0.625 V
MDC IDC PG IMPLANT DT: 20140818
MDC IDC SESS DTM: 20181015170557
MDC IDC SET LEADCHNL RV SENSING SENSITIVITY: 0.9 mV
MDC IDC STAT BRADY AS VS PERCENT: 0.02 %
MDC IDC STAT BRADY RV PERCENT PACED: 99.97 %

## 2017-03-01 ENCOUNTER — Encounter: Payer: Self-pay | Admitting: Cardiology

## 2017-03-05 ENCOUNTER — Ambulatory Visit (INDEPENDENT_AMBULATORY_CARE_PROVIDER_SITE_OTHER): Payer: Medicare Other | Admitting: *Deleted

## 2017-03-05 DIAGNOSIS — J309 Allergic rhinitis, unspecified: Secondary | ICD-10-CM

## 2017-03-09 DIAGNOSIS — Z23 Encounter for immunization: Secondary | ICD-10-CM | POA: Diagnosis not present

## 2017-03-11 ENCOUNTER — Encounter: Payer: Self-pay | Admitting: Internal Medicine

## 2017-03-11 ENCOUNTER — Ambulatory Visit (INDEPENDENT_AMBULATORY_CARE_PROVIDER_SITE_OTHER): Payer: Medicare Other | Admitting: Internal Medicine

## 2017-03-11 VITALS — BP 118/70 | HR 71 | Ht 67.0 in | Wt 160.2 lb

## 2017-03-11 DIAGNOSIS — Z950 Presence of cardiac pacemaker: Secondary | ICD-10-CM | POA: Diagnosis not present

## 2017-03-11 DIAGNOSIS — I442 Atrioventricular block, complete: Secondary | ICD-10-CM | POA: Diagnosis not present

## 2017-03-11 LAB — CUP PACEART INCLINIC DEVICE CHECK
Brady Statistic AP VP Percent: 14.25 %
Brady Statistic AP VS Percent: 0 %
Brady Statistic AS VP Percent: 85.73 %
Brady Statistic AS VS Percent: 0.02 %
Date Time Interrogation Session: 20181029150934
Implantable Lead Implant Date: 20140818
Implantable Lead Model: 5076
Lead Channel Impedance Value: 342 Ohm
Lead Channel Impedance Value: 456 Ohm
Lead Channel Impedance Value: 589 Ohm
Lead Channel Impedance Value: 608 Ohm
Lead Channel Pacing Threshold Amplitude: 0.75 V
Lead Channel Pacing Threshold Amplitude: 1 V
Lead Channel Pacing Threshold Pulse Width: 0.4 ms
Lead Channel Sensing Intrinsic Amplitude: 20.125 mV
Lead Channel Setting Pacing Amplitude: 2.5 V
Lead Channel Setting Pacing Pulse Width: 0.4 ms
Lead Channel Setting Sensing Sensitivity: 0.9 mV
MDC IDC LEAD IMPLANT DT: 20140818
MDC IDC LEAD LOCATION: 753859
MDC IDC LEAD LOCATION: 753860
MDC IDC MSMT BATTERY REMAINING LONGEVITY: 64 mo
MDC IDC MSMT BATTERY VOLTAGE: 3 V
MDC IDC MSMT LEADCHNL RA PACING THRESHOLD PULSEWIDTH: 0.4 ms
MDC IDC MSMT LEADCHNL RA SENSING INTR AMPL: 1.5 mV
MDC IDC MSMT LEADCHNL RA SENSING INTR AMPL: 2.375 mV
MDC IDC MSMT LEADCHNL RV SENSING INTR AMPL: 20.125 mV
MDC IDC PG IMPLANT DT: 20140818
MDC IDC SET LEADCHNL RV PACING AMPLITUDE: 2.5 V
MDC IDC STAT BRADY RA PERCENT PACED: 14.25 %
MDC IDC STAT BRADY RV PERCENT PACED: 99.97 %

## 2017-03-11 NOTE — Patient Instructions (Signed)
Medication Instructions: Your physician recommends that you continue on your current medications as directed. Please refer to the Current Medication list given to you today.  Labwork: None Ordered  Procedures/Testing: None Ordered  Follow-Up: Your physician wants you to follow-up in: 1 YEAR with Dr. Caryl Comes. You will receive a reminder letter in the mail two months in advance. If you don't receive a letter, please call our office to schedule the follow-up appointment.  Remote monitoring is used to monitor your Pacemaker from home. This monitoring reduces the number of office visits required to check your device to one time per year. It allows [place] to keep an eye on the functioning of your device to ensure it is working properly. You are scheduled for a device check from home on 05/27/17. You may send your transmission at any time that day. If you have a wireless device, the transmission will be sent automatically. After your physician reviews your transmission, you will receive a postcard with your next transmission date.    Any Additional Special Instructions Will Be Listed Below (If Applicable).     If you need a refill on your cardiac medications before your next appointment, please call your pharmacy.

## 2017-03-11 NOTE — Progress Notes (Signed)
A and a and     Patient Care Team: Burnard Bunting, MD as PCP - General (Internal Medicine) Chucky May, MD as Consulting Physician (Psychiatry)   HPI  [name] is a 71 y.o. female Seen in followup for high grade heart block for which she underwent pacing compllicated by microperforation modest effusion and then adderall withdrawal   She is far  better following reprogramming of the device after we noted upper rate behavior limiting heart rate excursion she still has complaints sometimes and exercise intolerance on stairs  She reminded that she has a diagnosis of central polyneuropathy;    She has been followed at [facility] with Marilynne Drivers. She was last seen 5/18.  Salt and proamatine were recommended  She has been doing pretty well. She has some dizziness. Some the morning. She also has vestibular dizziness which is quite distinct. She is working on exercising.  Echo EF 5/16 normal       Past Medical History:  Diagnosis Date  . ADHD (attention deficit hyperactivity disorder)   . Allergy    trees/pollen, mold, fungus, dust mites. Takes allergy shots  . Arthritis    PAIN AND OA LEFT HIP  . Asthma    allergist Dr Olena Heckle- monthly allergy injections  . Autonomic dysfunction    CENTRAL NERVOUS SYSTEM NEUROPATHY - DX BY DR. Erling Cruz MORE THAN 10 YRS AGO - AND IT IS FELT TO CONTRIBUTE TO THE AUTOMIC  DYSFUNCTION-- PT HAS NUMBNESS LEGS AND FEET AND SOMETIIMES TIPS OF FINGER, SEVERE CONSTIPATION( NO SENSATION TO HAVE BM ), DOUBLE VISION, ORTHOSTATIC HYPOTENSION  . Blood transfusion   . Breast cancer ([place])    right side  . Bruises easily   . Cataract   . Chest pain    a. 12/2012 Cath: nl cors, EF 55-65%.  . Complication of anesthesia    reaction to some anesthetics/ 7/12 anesth record on chart- states prefers epidural  . Depression   . Diverticulosis   . Dysrhythmia    HX OF HIGH GRADE HEART BLOCK - REQUIRED PACEMAKER INSERTION  . Esophageal stricture   .  Gastritis   . GERD (gastroesophageal reflux disease)   . H/O hiatal hernia   . Hemorrhoids   . Hernia of abdominal wall    spigelian hernia RLQ - SURGERY TO REPAIR  . Hypothyroidism   . Interstitial cystitis   . Latex allergy, contact dermatitis   . Mallory - Weiss tear    HEALED   . Neuromuscular disorder (HCC)    central nervous system neuropathy- seen per Dr Erling Cruz  . Pacemaker   . Pericardial effusion    a. 12/2012 following ppm placement;  b. 01/01/2013 Echo: EF 55-60%, small pericardial effusion w/o RV collapse-->No need for tap/window.  Marland Kitchen PONV (postoperative nausea and vomiting)    pt needs scop patch  . Recurrent upper respiratory infection (URI) 1/13- to present   bronchitis following surgery- states improved but still with cough. OV with [facility] Dr [name] 09/06/11 on chart  . Shortness of breath    AT TIMES - BUT MUCH IMPROVED AFTER PACEMAKER WAS REPROGRAMED.  Marland Kitchen Symptomatic bradycardia    a. 12/2012 s/p MDT dual chamber PPM, ser # [id number]; b. 12/2012 post-op course complicated by pericardial effusinon req lead revision.  . Thyroid disease     Past Surgical History:  Procedure Laterality Date  . ABDOMINAL HYSTERECTOMY  1974  . BACK SURGERY     cervical fusion 4-5 with plate  . BLADDER SUSPENSION    .  BREAST BIOPSY  2002   NO BLOOD PRESSURES ON RIGHT SIDE/   s/p  axillary node dissection  . CYSTO WITH HYDRODISTENSION  09/07/2011   Procedure: CYSTOSCOPY/HYDRODISTENSION;  Surgeon: Ailene Rud, MD;  Location: [facility];  Service: Urology;  Laterality: N/A;  INSTILLATION OF MARCAINE/PYRIDIUM INSTILLATION OF MARCAINE/KENALOG  . CYSTOSCOPY  1975, 2007  . EYE SURGERY     LASIK EYE SURGERY BILATERAL  . LAPAROSCOPY  1973  . LEAD REVISION N/A 12/30/2012   Procedure: LEAD REVISION;  Surgeon: Evans Lance, MD;  Location: [facility] CATH LAB;  Service: Cardiovascular;  Laterality: N/A;  . LEFT HEART CATHETERIZATION WITH CORONARY ANGIOGRAM N/A 12/29/2012   Procedure: LEFT HEART  CATHETERIZATION WITH CORONARY ANGIOGRAM;  Surgeon: Peter M Martinique, MD;  Location: [facility] CATH LAB;  Service: Cardiovascular;  Laterality: N/A;  . MASTECTOMY MODIFIED RADICAL     right; with immediate reconstruction  . MYRINGOPLASTY  1962  . NECK SURGERY     c4-5 ruptured disc  . OOPHORECTOMY  1982  . PACEMAKER INSERTION    . PERMANENT PACEMAKER INSERTION N/A 12/29/2012   Procedure: PERMANENT PACEMAKER INSERTION;  Surgeon: Deboraha Sprang, MD;  Location: [facility] CATH LAB;  Service: Cardiovascular;  Laterality: N/A;  . RECTOCELE REPAIR     with cystocele repair  . TONSILLECTOMY    . TOTAL HIP ARTHROPLASTY Right   . TOTAL HIP ARTHROPLASTY Left 06/12/2013   Procedure: LEFT TOTAL HIP ARTHROPLASTY ANTERIOR APPROACH;  Surgeon: Mcarthur Rossetti, MD;  Location: [facility];  Service: Orthopedics;  Laterality: Left;  . TYMPANOPLASTY  1973   right  . ULNAR NERVE TRANSPOSITION Right   . VENTRAL HERNIA REPAIR  05/30/2011   Procedure: HERNIA REPAIR VENTRAL ADULT;  Surgeon: Haywood Lasso, MD;  Location: [place];  Service: General;  Laterality: Right;  repair right spigelian hernia    Current Outpatient Prescriptions  Medication Sig Dispense Refill  . acetaminophen (TYLENOL) 325 MG tablet Take 2 tablets (650 mg total) by mouth every 6 (six) hours as needed for mild pain. 30 tablet 0  . albuterol (PROVENTIL HFA;VENTOLIN HFA) 108 (90 BASE) MCG/ACT inhaler Inhale 2 puffs into the lungs every 6 (six) hours as needed. For shortness of breath    . b complex vitamins capsule Take 1 capsule by mouth daily.     . cephALEXin (KEFLEX) 500 MG capsule TAKE 4 CAPSULES BY MOUTH 1 HOUR PRIOR TO APPOINTMENT  0  . cetirizine (ZYRTEC) 10 MG tablet Take by mouth.    . cholecalciferol (VITAMIN D) 1000 UNITS tablet Take 1,000 Units by mouth daily.    . ciclesonide (OMNARIS) 50 MCG/ACT nasal spray Place 2 sprays into both nostrils as needed for allergies.     . cycloSPORINE (RESTASIS) 0.05 % ophthalmic emulsion  Apply to eye.    Marland Kitchen EPINEPHrine (EPIPEN 2-PAK) 0.3 mg/0.3 mL IJ SOAJ injection Inject 0.3 mLs (0.3 mg total) into the muscle once. 2 Device 3  . escitalopram (LEXAPRO) 10 MG tablet Take 10 mg by mouth daily.    Marland Kitchen esomeprazole (NEXIUM) 40 MG capsule Take 1 capsule (40 mg total) by mouth daily. 90 capsule 1  . estradiol (ESTRING) 2 MG vaginal ring Place 2 mg vaginally every 3 (three) months. follow package directions    . estradiol (VIVELLE-DOT) 0.025 MG/24HR Place 1 patch onto the skin 2 (two) times a week. Applies new patch on Sunday & Thursday    . fluocinonide (LIDEX) 0.05 % external solution 1 APPLICATION ON THE SKIN  AS DIRECTED. APPLY TO SCALP BID FOR 1-2 WEEKS PRN FLARE AS DIRECTED  1  . levothyroxine (SYNTHROID, LEVOTHROID) 88 MCG tablet Take 100 mcg by mouth daily before breakfast.    . meclizine (ANTIVERT) 25 MG tablet Take 0.5 tablets (12.5 mg total) by mouth 3 (three) times daily. 45 tablet 1  . Melatonin 1 MG TABS Take 1.5 tablets by mouth at bedtime as needed.     . midodrine (PROAMATINE) 2.5 MG tablet Take 1 tablet (2.5 mg total) by mouth 3 (three) times daily with meals. 45 tablet 0  . mometasone-formoterol (DULERA) 200-5 MCG/ACT AERO Inhale 2 puffs into the lungs 2 (two) times daily as needed.     . montelukast (SINGULAIR) 10 MG tablet Take 1 tablet (10 mg total) by mouth daily. 30 tablet 5  . ondansetron (ZOFRAN ODT) 4 MG disintegrating tablet Take 1 tablet (4 mg total) by mouth every 8 (eight) hours as needed for nausea or vomiting. 12 tablet 2  . potassium chloride (KLOR-CON M10) 10 MEQ tablet Take 1 tablet (10 mEq total) by mouth daily. 90 tablet 3  . PRESCRIPTION MEDICATION ALLERGY SHOT EVERY 2 WEEKS BY DR. Neldon Mc    . Probiotic Product (ALIGN) 4 MG CAPS Take 4 mg by mouth at bedtime.     . ranitidine (ZANTAC) 300 MG capsule Take 1 capsule (300 mg total) by mouth every evening. 30 capsule 5  . vitamin C (ASCORBIC ACID) 500 MG tablet Take by mouth.     No current  facility-administered medications for this visit.    Facility-Administered Medications Ordered in Other Visits  Medication Dose Route Frequency Provider Last Rate Last Dose  . bupivacaine (MARCAINE) 0.5 % 10 mL, triamcinolone acetonide (KENALOG-40) 40 mg injection   Subcutaneous Once Carolan Clines, MD      . bupivacaine (MARCAINE) 0.5 % 15 mL, phenazopyridine (PYRIDIUM) 400 mg bladder mixture   Bladder Instillation Once Carolan Clines, MD        Allergies  Allergen Reactions  . Anesthetics, Amide Other (See Comments)    Patient unsure of names, however multiples cause swelling of airway & nausea  . Clindamycin Swelling  . Neomycin Swelling  . Shellfish Allergy Other (See Comments)    Neurotoxic reaction  . Sulfonamide Derivatives Anaphylaxis and Swelling  . Zolpidem Tartrate Other (See Comments)    Jerking motions   . Azithromycin Other (See Comments)    Severe gastritis   . Bactroban Other (See Comments)    Causes sores in nose  . Ciprofloxacin     REACTION: joint swelling  . Codeine Swelling  . Meperidine Hcl Nausea Only    Hallucinations   . Morphine Nausea Only    Hallucinations   . Neosporin [Neomycin-Polymyxin-Gramicidin] Hives and Dermatitis    All topical "orin's ointment"  . Nitrofurantoin     REACTION: neuropathy in legs  . Other     ALLERGIC TO WARM WATER SHELLFISH  PT STATES PLEASE NOTE SHE CAN TAKE OXYCODONE AND TYLENOL FOR PAIN -NOT ALLERGIC  . Penicillins Other (See Comments)    Swelling in joints  . Tramadol     Makes jerk  . Latex Rash    Review of Systems negative except from HPI and PMH  Physical Exam BP 118/70   Pulse 71   Ht 5\' 7"  (1.702 m)   Wt 160 lb 3.2 oz (72.7 kg)   BMI 25.09 kg/m  Well developed and nourished in no acute distress HENT normal Neck supple with JVP-flat Clear Regular  rate and rhythm, no murmurs or gallops Abd-soft with active BS No Clubbing cyanosis edema Skin-warm and dry A & Oriented  Grossly normal  sensory and motor function   ECG P-synchronous/ AV  pacing   Assessment and  Plan  Complete heart block  Pacemaker-[employer] The patient's device was interrogated.  The information was reviewed. No changes were made in the programming.     Exercise associated hypotension  ? Dysautonomia  Central polyneuropathy   Reviewed the physiology of proamatine and suggested it and fluids to start her morning -  Stressed exercise   Discussed sleep disturbance and use of breath right  We spent more than 50% of our >25 min visit in face to face counseling regarding the above

## 2017-03-12 DIAGNOSIS — M720 Palmar fascial fibromatosis [Dupuytren]: Secondary | ICD-10-CM | POA: Diagnosis not present

## 2017-03-12 DIAGNOSIS — M19041 Primary osteoarthritis, right hand: Secondary | ICD-10-CM | POA: Diagnosis not present

## 2017-03-12 DIAGNOSIS — M79641 Pain in right hand: Secondary | ICD-10-CM | POA: Diagnosis not present

## 2017-03-13 ENCOUNTER — Ambulatory Visit (INDEPENDENT_AMBULATORY_CARE_PROVIDER_SITE_OTHER): Payer: Medicare Other | Admitting: Orthopaedic Surgery

## 2017-03-13 ENCOUNTER — Encounter: Payer: Self-pay | Admitting: Internal Medicine

## 2017-03-13 ENCOUNTER — Telehealth: Payer: Self-pay | Admitting: Internal Medicine

## 2017-03-13 DIAGNOSIS — G8929 Other chronic pain: Secondary | ICD-10-CM | POA: Diagnosis not present

## 2017-03-13 DIAGNOSIS — M25511 Pain in right shoulder: Secondary | ICD-10-CM

## 2017-03-13 NOTE — Telephone Encounter (Signed)
Pt called to report still having irregular heart beat and pains in the left side of her chest.     Pt want to know if pacer set correctly.

## 2017-03-13 NOTE — Progress Notes (Signed)
The patient is following up after subacromial injection in her right shoulder.  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  She is much better than she was she states before the injection and this was a month ago.  I can easily put her right shoulder through abduction and abduction as well as external rotation she has just mild pain.  At this point she is doing so much better that she will follow-up as needed.  If anything flares up on her daytime she will let us know.

## 2017-03-14 NOTE — Telephone Encounter (Signed)
Called pt back and informed her that her transmission received pacemaker working normally, no arrhythmia episodes, pt's parameter on pacemaker are set at 150 bpm so it the pacemaker will only record heart rate episodes of greater than 150. Pt stated that what she has been feeling was like when they run the tests on her device. Informed pt that when those tests are run the heart rate is only increased to 90-95bpm and if she is feeling the same the pacemaker will not record heart rates of 90-95. Informed pt that I would route this back to triage to address her symptoms of pains.

## 2017-03-14 NOTE — Telephone Encounter (Signed)
Spoke with patient who states she was having "sparks" of pain in her left chest similar to the sensations she feels when her pacemaker is being interrogated. She states it is located under the area of the pacemaker in her left chest. She states she also felt some sensations in the center of her chest. She states it was worse with deep breathing or coughing. She states she has not been very active this morning. She states she has felt this intermittently since her ov with Dr. Caryl Comes on Monday. She states she also noted some dizziness with these symptoms. States her normal BP is usually low but she has not been monitoring it over the past few days. She states she asked Dr. Caryl Comes about the feeling that she has sometimes when she gets a certain feeling in her throat and he related it to her hypotension; she states these feel different. She has hx of central polyneuropathy and autonomic neuropathy and she does not feel that these symptoms are associated. I asked her if she feels these symptoms need immediate attention and she denies; states she would like to continue to monitor these symptoms and call back if they worsen. I advised that I will route message to Dr. Caryl Comes and that someone from our office will call her if he would like for her to do anything different. She verbalized understanding and agreement and thanked me for the call.

## 2017-03-14 NOTE — Telephone Encounter (Signed)
Called pt back and requested her to send in a transmission, explained to pt that no changes were made on the 10/29 visit. Pt stated that she would send in a transmission.

## 2017-03-21 NOTE — Telephone Encounter (Signed)
Dr. Caryl Comes here are her pacing and PVC counters from the Nov 1 remote

## 2017-03-21 NOTE — Telephone Encounter (Signed)
[name], can I get you to look at her V sense percentage and to see whether there are any rapidly cracked ventricular paced beats like there were on her interrogation from 10/29.  PVCs before such might be contributing to her symptoms.

## 2017-03-25 DIAGNOSIS — R0781 Pleurodynia: Secondary | ICD-10-CM | POA: Diagnosis not present

## 2017-03-25 DIAGNOSIS — Z6825 Body mass index (BMI) 25.0-25.9, adult: Secondary | ICD-10-CM | POA: Diagnosis not present

## 2017-03-26 ENCOUNTER — Ambulatory Visit (INDEPENDENT_AMBULATORY_CARE_PROVIDER_SITE_OTHER): Payer: Medicare Other | Admitting: Allergy and Immunology

## 2017-03-26 ENCOUNTER — Encounter: Payer: Self-pay | Admitting: Allergy and Immunology

## 2017-03-26 VITALS — BP 100/68 | HR 64 | Resp 16

## 2017-03-26 DIAGNOSIS — J3089 Other allergic rhinitis: Secondary | ICD-10-CM

## 2017-03-26 DIAGNOSIS — K219 Gastro-esophageal reflux disease without esophagitis: Secondary | ICD-10-CM

## 2017-03-26 DIAGNOSIS — T17308D Unspecified foreign body in larynx causing other injury, subsequent encounter: Secondary | ICD-10-CM | POA: Diagnosis not present

## 2017-03-26 MED ORDER — RANITIDINE HCL 300 MG PO CAPS: 300.0000 mg | ORAL_CAPSULE | Freq: Every evening | ORAL | 1 refills | Status: DC

## 2017-03-26 MED ORDER — ESOMEPRAZOLE MAGNESIUM 40 MG PO CPDR: 40.0000 mg | DELAYED_RELEASE_CAPSULE | Freq: Every day | ORAL | 1 refills | Status: DC

## 2017-03-26 MED ORDER — MONTELUKAST SODIUM 10 MG PO TABS: 10.0000 mg | ORAL_TABLET | Freq: Every day | ORAL | 1 refills | Status: DC

## 2017-03-26 NOTE — Patient Instructions (Addendum)
  1. Continue Nexium 40 mg in a.m.  2. Continue ranitidine 300 mg in PM  3. Continue cetirizine/Zyrtec to 10 mg twice a day  4. Continue montelukast 10 mg one time per day  5. START OTC Nasacort one spray each nostril one time per day  6. INCREASE immunotherapy injection to 0.2 mL with next injection and continue at 0.2 mL every 3 weeks  7. Continue EpiPen if needed  8. Obtain modified barium swallow  9. eliminate all caffeine consumption  10. Return to clinic in Christmas 2018. Escalation of immunotherapy dose?

## 2017-03-26 NOTE — Progress Notes (Signed)
Follow-up Note  Referring Provider: Burnard Bunting, MD Primary Provider: Burnard Bunting, MD Date of Office Visit: 03/26/2017  Subjective:   [name] (DOB: Jul 09, 1945) is a 71 y.o. female who returns to the [facility] [place] on 03/26/2017 in re-evaluation of the following:  HPI: Meg returns to this clinic in evaluation of her immunotherapy reaction and her LPR and allergic rhinitis addressed during her last visit of 01 January 2017.  Her immunotherapy has been going quite well on 0.1 mL's every 3 weeks.  She has not had an adverse response secondary to this administration.  She does complain of having lots of drainage in the back of her throat and occasionally some intermittent nasal congestion.  She has not required an antibiotic to treat an episode of sinusitis since her last visit in this clinic.  She still continues to have coughing.  There appears to be a trigger of the eating that gives rise to a spasmodic type cough and choking.  She still continues to drink caffeine with 2 mugs of coffee per day.  She continues on Nexium and ranitidine.  Allergies as of 03/26/2017      Reactions   Anesthetics, Amide Other (See Comments)   Patient unsure of names, however multiples cause swelling of airway & nausea   Clindamycin Swelling   Neomycin Swelling   Shellfish Allergy Other (See Comments)   Neurotoxic reaction   Sulfonamide Derivatives Anaphylaxis, Swelling   Zolpidem Tartrate Other (See Comments)   Jerking motions    Azithromycin Other (See Comments)   Severe gastritis    Bactroban Other (See Comments)   Causes sores in nose   Ciprofloxacin    REACTION: joint swelling   Codeine Swelling   Meperidine Hcl Nausea Only   Hallucinations   Morphine Nausea Only   Hallucinations    Neosporin [neomycin-polymyxin-gramicidin] Hives, Dermatitis   All topical "orin's ointment"   Nitrofurantoin    REACTION: neuropathy in legs   Other    ALLERGIC TO WARM  WATER SHELLFISH PT STATES PLEASE NOTE SHE CAN TAKE OXYCODONE AND TYLENOL FOR PAIN -NOT ALLERGIC   Penicillins Other (See Comments)   Swelling in joints   Tramadol    Makes jerk   Latex Rash      Medication List      acetaminophen 325 MG tablet Commonly known as:  TYLENOL Take 2 tablets (650 mg total) by mouth every 6 (six) hours as needed for mild pain.   albuterol 108 (90 Base) MCG/ACT inhaler Commonly known as:  PROVENTIL HFA;VENTOLIN HFA Inhale 2 puffs into the lungs every 6 (six) hours as needed. For shortness of breath   ALIGN 4 MG Caps Take 4 mg by mouth at bedtime.   b complex vitamins capsule Take 1 capsule by mouth daily.   cephALEXin 500 MG capsule Commonly known as:  KEFLEX TAKE 4 CAPSULES BY MOUTH 1 HOUR PRIOR TO APPOINTMENT   cetirizine 10 MG tablet Commonly known as:  ZYRTEC Take by mouth.   cholecalciferol 1000 units tablet Commonly known as:  VITAMIN D Take 1,000 Units by mouth daily.   ciclesonide 50 MCG/ACT nasal spray Commonly known as:  OMNARIS Place 2 sprays into both nostrils as needed for allergies.   cycloSPORINE 0.05 % ophthalmic emulsion Commonly known as:  RESTASIS Apply to eye.   DULERA 200-5 MCG/ACT Aero Generic drug:  mometasone-formoterol Inhale 2 puffs into the lungs 2 (two) times daily as needed.   EPINEPHrine 0.3 mg/0.3 mL Soaj  injection Commonly known as:  EPIPEN 2-PAK Inject 0.3 mLs (0.3 mg total) into the muscle once.   escitalopram 10 MG tablet Commonly known as:  LEXAPRO Take 10 mg by mouth daily.   esomeprazole 40 MG capsule Commonly known as:  NEXIUM Take 1 capsule (40 mg total) daily by mouth.   estradiol 0.025 MG/24HR Commonly known as:  VIVELLE-DOT Place 1 patch onto the skin 2 (two) times a week. Applies new patch on Sunday & Thursday   estradiol 2 MG vaginal ring Commonly known as:  ESTRING Place 2 mg vaginally every 3 (three) months. follow package directions   fluocinonide 0.05 % external  solution Commonly known as:  LIDEX 1 APPLICATION ON THE SKIN AS DIRECTED. APPLY TO SCALP BID FOR 1-2 WEEKS PRN FLARE AS DIRECTED   levothyroxine 88 MCG tablet Commonly known as:  SYNTHROID, LEVOTHROID Take 100 mcg by mouth daily before breakfast.   meclizine 25 MG tablet Commonly known as:  ANTIVERT Take 0.5 tablets (12.5 mg total) by mouth 3 (three) times daily.   Melatonin 1 MG Tabs Take 1.5 tablets by mouth at bedtime as needed.   midodrine 2.5 MG tablet Commonly known as:  PROAMATINE Take 1 tablet (2.5 mg total) by mouth 3 (three) times daily with meals.   montelukast 10 MG tablet Commonly known as:  SINGULAIR Take 1 tablet (10 mg total) daily by mouth.   ondansetron 4 MG disintegrating tablet Commonly known as:  ZOFRAN ODT Take 1 tablet (4 mg total) by mouth every 8 (eight) hours as needed for nausea or vomiting.   potassium chloride 10 MEQ tablet Commonly known as:  KLOR-CON M10 Take 1 tablet (10 mEq total) by mouth daily.   PRESCRIPTION MEDICATION ALLERGY SHOT EVERY 2 WEEKS BY DR. Neldon Mc   ranitidine 300 MG capsule Commonly known as:  ZANTAC Take 1 capsule (300 mg total) every evening by mouth.   vitamin C 500 MG tablet Commonly known as:  ASCORBIC ACID Take by mouth.       Past Medical History:  Diagnosis Date  . ADHD (attention deficit hyperactivity disorder)   . Allergy    trees/pollen, mold, fungus, dust mites. Takes allergy shots  . Arthritis    PAIN AND OA LEFT HIP  . Asthma    allergist Dr Olena Heckle- monthly allergy injections  . Autonomic dysfunction    CENTRAL NERVOUS SYSTEM NEUROPATHY - DX BY DR. Erling Cruz MORE THAN 10 YRS AGO - AND IT IS FELT TO CONTRIBUTE TO THE AUTOMIC  DYSFUNCTION-- PT HAS NUMBNESS LEGS AND FEET AND SOMETIIMES TIPS OF FINGER, SEVERE CONSTIPATION( NO SENSATION TO HAVE BM ), DOUBLE VISION, ORTHOSTATIC HYPOTENSION  . Blood transfusion   . Breast cancer ([place])    right side  . Bruises easily   . Cataract   . Chest pain    a. 12/2012  Cath: nl cors, EF 55-65%.  . Complication of anesthesia    reaction to some anesthetics/ 7/12 anesth record on chart- states prefers epidural  . Depression   . Diverticulosis   . Dysrhythmia    HX OF HIGH GRADE HEART BLOCK - REQUIRED PACEMAKER INSERTION  . Esophageal stricture   . Gastritis   . GERD (gastroesophageal reflux disease)   . H/O hiatal hernia   . Hemorrhoids   . Hernia of abdominal wall    spigelian hernia RLQ - SURGERY TO REPAIR  . Hypothyroidism   . Interstitial cystitis   . Latex allergy, contact dermatitis   . [place] - Mariel Kansky  tear    HEALED   . Neuromuscular disorder (HCC)    central nervous system neuropathy- seen per Dr Erling Cruz  . Pacemaker   . Pericardial effusion    a. 12/2012 following ppm placement;  b. 01/01/2013 Echo: EF 55-60%, small pericardial effusion w/o RV collapse-->No need for tap/window.  Marland Kitchen PONV (postoperative nausea and vomiting)    pt needs scop patch  . Recurrent upper respiratory infection (URI) 1/13- to present   bronchitis following surgery- states improved but still with cough. OV with [facility] Dr [name] 09/06/11 on chart  . Shortness of breath    AT TIMES - BUT MUCH IMPROVED AFTER PACEMAKER WAS REPROGRAMED.  Marland Kitchen Symptomatic bradycardia    a. 12/2012 s/p MDT dual chamber PPM, ser # [id number]; b. 12/2012 post-op course complicated by pericardial effusinon req lead revision.  . Thyroid disease     Past Surgical History:  Procedure Laterality Date  . ABDOMINAL HYSTERECTOMY  1974  . BACK SURGERY     cervical fusion 4-5 with plate  . BLADDER SUSPENSION    . BREAST BIOPSY  2002   NO BLOOD PRESSURES ON RIGHT SIDE/   s/p  axillary node dissection  . CYSTOSCOPY  1975, 2007  . EYE SURGERY     LASIK EYE SURGERY BILATERAL  . LAPAROSCOPY  1973  . MASTECTOMY MODIFIED RADICAL     right; with immediate reconstruction  . MYRINGOPLASTY  1962  . NECK SURGERY     c4-5 ruptured disc  . OOPHORECTOMY  1982  . PACEMAKER INSERTION    . RECTOCELE REPAIR      with cystocele repair  . TONSILLECTOMY    . TOTAL HIP ARTHROPLASTY Right   . TYMPANOPLASTY  1973   right  . ULNAR NERVE TRANSPOSITION Right     Review of systems negative except as noted in HPI / PMHx or noted below:  Review of Systems  Constitutional: Negative.   HENT: Negative.   Eyes: Negative.   Respiratory: Negative.   Cardiovascular: Negative.   Gastrointestinal: Negative.   Genitourinary: Negative.   Musculoskeletal: Negative.   Skin: Negative.   Neurological: Negative.   Endo/Heme/Allergies: Negative.   Psychiatric/Behavioral: Negative.      Objective:   Vitals:   03/26/17 1521  BP: 100/68  Pulse: 64  Resp: 16          Physical Exam  Constitutional: She is well-developed, well-nourished, and in no distress.  Raspy voice, throat clearing  HENT:  Head: Normocephalic.  Right Ear: External ear and ear canal normal. Tympanic membrane is scarred.  Left Ear: Tympanic membrane, external ear and ear canal normal.  Nose: Nose normal. No mucosal edema or rhinorrhea.  Mouth/Throat: Uvula is midline, oropharynx is clear and moist and mucous membranes are normal. No oropharyngeal exudate.  Eyes: Conjunctivae are normal.  Neck: Trachea normal. No tracheal tenderness present. No tracheal deviation present. No thyromegaly present.  Cardiovascular: Normal rate, regular rhythm, S1 normal, S2 normal and normal heart sounds.  No murmur heard. Pulmonary/Chest: Breath sounds normal. No stridor. No respiratory distress. She has no wheezes. She has no rales.  Musculoskeletal: She exhibits no edema.  Lymphadenopathy:       Head (right side): No tonsillar adenopathy present.       Head (left side): No tonsillar adenopathy present.    She has no cervical adenopathy.  Neurological: She is alert. Gait normal.  Skin: No rash noted. She is not diaphoretic. No erythema. Nails show no clubbing.  Psychiatric: Mood and  affect normal.    Diagnostics:    Spirometry was performed  and demonstrated an FEV1 of 2.35 at 94 % of predicted.  The patient had an Asthma Control Test with the following results: ACT Total Score: 9.    Assessment and Plan:   1. Other allergic rhinitis   2. LPRD (laryngopharyngeal reflux disease)   3. Choking, subsequent encounter     1. Continue Nexium 40 mg in a.m.  2. Continue ranitidine 300 mg in PM  3. Continue cetirizine/Zyrtec to 10 mg twice a day  4. Continue montelukast 10 mg one time per day  5. START OTC Nasacort one spray each nostril one time per day  6. INCREASE immunotherapy injection to 0.2 mL with next injection and continue at 0.2 mL every 3 weeks  7. Continue EpiPen if needed  8. Obtain modified barium swallow  9. eliminate all caffeine consumption  10. Return to clinic in Christmas 2018. Escalation of immunotherapy dose?  Meg is having coughing and her history is consistent with LPR but she is not responding optimally to pretty aggressive therapy directed against reflux.  I have encouraged her to eliminate all caffeine consumption at this point.  I will obtain a modified barium swallow.  Her last modified barium swallow was about 4 years ago after her neck surgery and did identify some abnormality with swallowing.  We can increase her immunotherapy at this point in time as noted above.  I will regroup with her in approximately 8 weeks or earlier if there is a problem.  If she still continues to cough in the face of the plan mentioned above we may need to image her respiratory tract again looking for other etiologic agents besides for respiratory tract inflammation and reflux induced respiratory disease contributing to this issue.  Allena Katz, MD Allergy / Immunology [place]

## 2017-03-27 ENCOUNTER — Encounter: Payer: Self-pay | Admitting: Allergy and Immunology

## 2017-03-27 ENCOUNTER — Telehealth: Payer: Self-pay

## 2017-03-27 NOTE — Telephone Encounter (Signed)
I called patient to let her know of appointment that has been scheduled for the barium swallow. 04-01-17 at 10 am. [address] in [place]. I left message with all of this information and repeated it.

## 2017-03-27 NOTE — Addendum Note (Signed)
Addended by: Lucrezia Starch I on: 03/27/2017 11:06 AM   Modules accepted: Orders

## 2017-03-27 NOTE — Addendum Note (Signed)
Addended by: Herbie Drape on: 03/27/2017 04:13 PM   Modules accepted: Orders

## 2017-04-01 ENCOUNTER — Ambulatory Visit
Admission: RE | Admit: 2017-04-01 | Discharge: 2017-04-01 | Disposition: A | Discharge status: Home / Self Care | Payer: Medicare Other | Source: Ambulatory Visit | Attending: Allergy and Immunology | Admitting: Allergy and Immunology

## 2017-04-01 ENCOUNTER — Telehealth: Payer: Self-pay | Admitting: *Deleted

## 2017-04-01 DIAGNOSIS — R1310 Dysphagia, unspecified: Secondary | ICD-10-CM

## 2017-04-01 DIAGNOSIS — K219 Gastro-esophageal reflux disease without esophagitis: Secondary | ICD-10-CM | POA: Diagnosis not present

## 2017-04-01 DIAGNOSIS — T17308D Unspecified foreign body in larynx causing other injury, subsequent encounter: Secondary | ICD-10-CM

## 2017-04-01 NOTE — Telephone Encounter (Signed)
-----   Message from Jiles Prows, MD sent at 04/01/2017 12:39 PM EST ----- This should have been a MODIFIED Barium Swallow test as specified in my note. What happened? What scheduling system did we use?

## 2017-04-02 ENCOUNTER — Telehealth: Payer: Self-pay | Admitting: Allergy and Immunology

## 2017-04-02 NOTE — Addendum Note (Signed)
Addended by: Lucrezia Starch I on: 04/02/2017 09:55 AM   Modules accepted: Orders

## 2017-04-02 NOTE — Telephone Encounter (Signed)
So the issue is that [facility] TEST. Please get the correct test completed.

## 2017-04-02 NOTE — Telephone Encounter (Signed)
Called [facility] Got transferred to Speech Pathology due to MODIFIED barium swallow Had to leave a message for them to contact us back to schedule for patient

## 2017-04-02 NOTE — Telephone Encounter (Signed)
Ordering corrected test. Will send staff message to marie/nicole to have scheduled.

## 2017-04-02 NOTE — Telephone Encounter (Signed)
-----   Message from Rosalio Loud, [place] sent at 04/02/2017 10:29 AM EST ----- Regarding: RE: MODIFIED barium swallow No pa needed and yes it is a procedure ----- Message ----- From: Gildardo Pounds, Kerin Perna Sent: 04/02/2017  10:26 AM To: Rosalio Loud, CMA Subject: RE: MODIFIED barium swallow                    This is a procedure, right?? I need to call and get a prior auth??  (( still learning this ))  ----- Message ----- From: Lucrezia Starch I, CMA Sent: 04/02/2017   9:55 AM To: Jenna Luo Subject: MODIFIED barium swallow                        Please schedule patient for modified barium swallow at [facility].   Thank you.

## 2017-04-02 NOTE — Telephone Encounter (Signed)
[facility] performed study. They do not do the modified barium swallow tests. I can make a note of this and for future references use a different facility to get that test.

## 2017-04-03 ENCOUNTER — Other Ambulatory Visit (HOSPITAL_COMMUNITY): Payer: Self-pay | Admitting: Allergy and Immunology

## 2017-04-03 DIAGNOSIS — R1310 Dysphagia, unspecified: Secondary | ICD-10-CM

## 2017-04-03 NOTE — Telephone Encounter (Signed)
Spoke to [employer] Scheduled modified barium swallow 04/09/2017 @ 11:15 1st floor radiology  Called and spoke to patient Clarified that it is a different test than the barium swallow she had at [facility] radiology Patient expressed understanding and will attend the appt

## 2017-04-08 ENCOUNTER — Encounter: Payer: Self-pay | Admitting: Allergy and Immunology

## 2017-04-08 ENCOUNTER — Other Ambulatory Visit: Payer: Self-pay | Admitting: Internal Medicine

## 2017-04-09 ENCOUNTER — Ambulatory Visit (HOSPITAL_COMMUNITY): Admission: RE | Admit: 2017-04-09 | Payer: Medicare Other | Source: Ambulatory Visit

## 2017-04-09 ENCOUNTER — Ambulatory Visit (HOSPITAL_COMMUNITY): Payer: Medicare Other

## 2017-04-09 DIAGNOSIS — R933 Abnormal findings on diagnostic imaging of other parts of digestive tract: Secondary | ICD-10-CM | POA: Diagnosis not present

## 2017-04-09 DIAGNOSIS — R05 Cough: Secondary | ICD-10-CM | POA: Diagnosis not present

## 2017-04-09 NOTE — Telephone Encounter (Signed)
Patient has called today after finishing her GI appt Patient will be calling [place] back to reschedule her modified barium swallow - the GI doctor wants her to proceed with this. Also the GI doctor [facility]) has requested an endoscopy and other testing to be performed as well.   These orders and office notes should be available to you via his office or care everywhere. Patient wanted you aware of upcoming testing and her reschedule If there are any questions, please contact the patient at [phone]

## 2017-04-19 ENCOUNTER — Ambulatory Visit (HOSPITAL_COMMUNITY)
Admission: RE | Admit: 2017-04-19 | Discharge: 2017-04-19 | Disposition: A | Discharge status: Home / Self Care | Payer: Medicare Other | Source: Ambulatory Visit | Attending: Allergy and Immunology | Admitting: Allergy and Immunology

## 2017-04-19 ENCOUNTER — Ambulatory Visit (INDEPENDENT_AMBULATORY_CARE_PROVIDER_SITE_OTHER): Payer: Medicare Other

## 2017-04-19 DIAGNOSIS — R1310 Dysphagia, unspecified: Secondary | ICD-10-CM | POA: Diagnosis not present

## 2017-04-19 DIAGNOSIS — J309 Allergic rhinitis, unspecified: Secondary | ICD-10-CM | POA: Diagnosis not present

## 2017-04-19 DIAGNOSIS — K219 Gastro-esophageal reflux disease without esophagitis: Secondary | ICD-10-CM | POA: Diagnosis not present

## 2017-04-19 NOTE — Progress Notes (Signed)
Modified Barium Swallow Progress Note  Patient Details  Name: [name] MRN: [id number] Date of Birth: 04-Sep-1945  Today's Date: 04/19/2017  Modified Barium Swallow completed.  Full report located under Chart Review in the Imaging Section.  Brief recommendations include the following:  Clinical Impression  Pt demonstrates normal oral and oropharyngeal function. Barium tablet lodged in the upper-mid esophagus, passed with liquids. No diet modification or SLP f/u warranted.    Swallow Evaluation Recommendations       SLP Diet Recommendations: Regular solids;Thin liquid   Liquid Administration via: Cup;Straw   Medication Administration: Whole meds with liquid   Supervision: Patient able to self feed   Compensations: Slow rate;Small sips/bites;Follow solids with liquid   Postural Changes: Seated upright at 90 degrees;Remain semi-upright after after feeds/meals (Comment)   Oral Care Recommendations: Patient independent with oral care        Mikolaj Woolstenhulme, Katherene Ponto 04/19/2017,2:28 PM

## 2017-04-24 ENCOUNTER — Telehealth: Payer: Self-pay

## 2017-04-24 DIAGNOSIS — R059 Cough, unspecified: Secondary | ICD-10-CM

## 2017-04-24 DIAGNOSIS — R05 Cough: Secondary | ICD-10-CM

## 2017-04-24 NOTE — Telephone Encounter (Signed)
-----   Message from Jiles Prows, MD sent at 04/23/2017 12:49 PM EST ----- Please inform patient that MBS was OK although there was some difficulty passing the tablet from mouth to esophagus although not bad enough to warrant any therapy. Her last CXR was 2016 so lets get another CXR with diagnosis cough.

## 2017-04-24 NOTE — Telephone Encounter (Signed)
Patient aware of her results and understands that she needs to go get a CXR. Order is in the system

## 2017-05-01 DIAGNOSIS — Z01818 Encounter for other preprocedural examination: Secondary | ICD-10-CM | POA: Diagnosis not present

## 2017-05-10 ENCOUNTER — Ambulatory Visit (INDEPENDENT_AMBULATORY_CARE_PROVIDER_SITE_OTHER): Payer: Medicare Other

## 2017-05-10 ENCOUNTER — Ambulatory Visit
Admission: RE | Admit: 2017-05-10 | Discharge: 2017-05-10 | Disposition: A | Discharge status: Home / Self Care | Payer: Medicare Other | Source: Ambulatory Visit | Attending: Allergy and Immunology | Admitting: Allergy and Immunology

## 2017-05-10 DIAGNOSIS — R05 Cough: Secondary | ICD-10-CM

## 2017-05-10 DIAGNOSIS — J309 Allergic rhinitis, unspecified: Secondary | ICD-10-CM | POA: Diagnosis not present

## 2017-05-10 DIAGNOSIS — R059 Cough, unspecified: Secondary | ICD-10-CM

## 2017-05-16 DIAGNOSIS — R05 Cough: Secondary | ICD-10-CM | POA: Diagnosis not present

## 2017-05-16 DIAGNOSIS — R933 Abnormal findings on diagnostic imaging of other parts of digestive tract: Secondary | ICD-10-CM | POA: Diagnosis not present

## 2017-05-17 ENCOUNTER — Telehealth: Payer: Self-pay

## 2017-05-17 NOTE — Telephone Encounter (Signed)
Patient called and wanted to let Dr. Neldon Mc know that after seeing her GI doctor and having her manometry done, it came back abnormal. Patient stated that her Doctor believes it is spacers in her esophagus rather that the reflux. Patient stated that her doctor informed her that her esophagus isn't emptying and the acid is coming up while she is eating. He has double her Nexium and has schedule her an endoscopy on 05/30/2017.

## 2017-05-20 NOTE — Telephone Encounter (Signed)
Ok thanks.  Appears nothing further to do at this time and Dr. Neldon Mc may be interested in her endoscopy results.

## 2017-05-27 ENCOUNTER — Encounter: Payer: Medicare Other | Admitting: *Deleted

## 2017-05-30 ENCOUNTER — Encounter: Payer: Self-pay | Admitting: Cardiology

## 2017-05-30 DIAGNOSIS — Z79899 Other long term (current) drug therapy: Secondary | ICD-10-CM | POA: Diagnosis not present

## 2017-05-30 DIAGNOSIS — M8580 Other specified disorders of bone density and structure, unspecified site: Secondary | ICD-10-CM | POA: Diagnosis not present

## 2017-05-30 DIAGNOSIS — J45909 Unspecified asthma, uncomplicated: Secondary | ICD-10-CM | POA: Diagnosis not present

## 2017-05-30 DIAGNOSIS — K3189 Other diseases of stomach and duodenum: Secondary | ICD-10-CM | POA: Diagnosis not present

## 2017-05-30 DIAGNOSIS — D131 Benign neoplasm of stomach: Secondary | ICD-10-CM | POA: Diagnosis not present

## 2017-05-30 DIAGNOSIS — K317 Polyp of stomach and duodenum: Secondary | ICD-10-CM | POA: Diagnosis not present

## 2017-05-30 DIAGNOSIS — K219 Gastro-esophageal reflux disease without esophagitis: Secondary | ICD-10-CM | POA: Diagnosis not present

## 2017-05-30 DIAGNOSIS — K222 Esophageal obstruction: Secondary | ICD-10-CM | POA: Diagnosis not present

## 2017-05-30 DIAGNOSIS — R933 Abnormal findings on diagnostic imaging of other parts of digestive tract: Secondary | ICD-10-CM | POA: Diagnosis not present

## 2017-05-30 DIAGNOSIS — E039 Hypothyroidism, unspecified: Secondary | ICD-10-CM | POA: Diagnosis not present

## 2017-05-30 DIAGNOSIS — Z950 Presence of cardiac pacemaker: Secondary | ICD-10-CM | POA: Diagnosis not present

## 2017-05-30 DIAGNOSIS — Z853 Personal history of malignant neoplasm of breast: Secondary | ICD-10-CM | POA: Diagnosis not present

## 2017-05-30 DIAGNOSIS — I4891 Unspecified atrial fibrillation: Secondary | ICD-10-CM | POA: Diagnosis not present

## 2017-05-30 DIAGNOSIS — K449 Diaphragmatic hernia without obstruction or gangrene: Secondary | ICD-10-CM | POA: Diagnosis not present

## 2017-06-03 ENCOUNTER — Ambulatory Visit (INDEPENDENT_AMBULATORY_CARE_PROVIDER_SITE_OTHER): Payer: Medicare Other | Admitting: *Deleted

## 2017-06-03 DIAGNOSIS — J309 Allergic rhinitis, unspecified: Secondary | ICD-10-CM | POA: Diagnosis not present

## 2017-06-05 ENCOUNTER — Ambulatory Visit (INDEPENDENT_AMBULATORY_CARE_PROVIDER_SITE_OTHER): Payer: Medicare Other | Admitting: *Deleted

## 2017-06-05 DIAGNOSIS — I442 Atrioventricular block, complete: Secondary | ICD-10-CM | POA: Diagnosis not present

## 2017-06-06 ENCOUNTER — Telehealth (INDEPENDENT_AMBULATORY_CARE_PROVIDER_SITE_OTHER): Payer: Self-pay | Admitting: Orthopaedic Surgery

## 2017-06-06 ENCOUNTER — Encounter: Payer: Self-pay | Admitting: Cardiology

## 2017-06-06 NOTE — Telephone Encounter (Signed)
noted 

## 2017-06-06 NOTE — Progress Notes (Signed)
Remote pacemaker transmission.   

## 2017-06-06 NOTE — Telephone Encounter (Signed)
FYI Patient requesting cortisone inj in her right shoulder for appt 01/30.

## 2017-06-12 ENCOUNTER — Ambulatory Visit (INDEPENDENT_AMBULATORY_CARE_PROVIDER_SITE_OTHER): Payer: Self-pay

## 2017-06-12 ENCOUNTER — Encounter (INDEPENDENT_AMBULATORY_CARE_PROVIDER_SITE_OTHER): Payer: Self-pay | Admitting: Orthopaedic Surgery

## 2017-06-12 ENCOUNTER — Ambulatory Visit (INDEPENDENT_AMBULATORY_CARE_PROVIDER_SITE_OTHER): Payer: Medicare Other | Admitting: Physician Assistant

## 2017-06-12 DIAGNOSIS — M25511 Pain in right shoulder: Secondary | ICD-10-CM | POA: Diagnosis not present

## 2017-06-12 DIAGNOSIS — G8929 Other chronic pain: Secondary | ICD-10-CM

## 2017-06-12 MED ORDER — LIDOCAINE HCL 1 % IJ SOLN
3.0000 mL | INTRAMUSCULAR | Status: AC | PRN
  Administered 2017-06-12: 3 mL

## 2017-06-12 MED ORDER — METHYLPREDNISOLONE ACETATE 40 MG/ML IJ SUSP
40.0000 mg | INTRAMUSCULAR | Status: AC | PRN
  Administered 2017-06-12: 40 mg via INTRA_ARTICULAR

## 2017-06-12 NOTE — Progress Notes (Signed)
Office Visit Note   Patient: [name]           Date of Birth: July 08, 1945           MRN: [id number] Visit Date: 06/12/2017              Requested by: Burnard Bunting, MD [address] [place], [place] [address] PCP: Burnard Bunting, MD   Assessment & Plan: Visit Diagnoses:  1. Chronic right shoulder pain     Plan: We will have her work on wall crawls, pendulum exercise and rotator cuff strengthening exercises as discussed with her and handouts given showing his exercises show.  See her back in 2 weeks to check her progress or lack of.  She does not want to undergo an MRI of her shoulder due to the fact that she states that it is a big ordeal in order to have this done with a pacemaker she insists she has had an MRI with pacemaker done in the past but it is an ordeal.  Follow-Up Instructions: Return in about 2 weeks (around 06/26/2017).   Orders:  Orders Placed This Encounter  Procedures  . XR Shoulder Right   No orders of the defined types were placed in this encounter.     Procedures: Large Joint Inj: R subacromial bursa on 06/12/2017 6:22 PM Indications: pain Details: 22 G 1.5 in needle, superior approach  Arthrogram: No  Medications: 3 mL lidocaine 1 %; 40 mg methylPREDNISolone acetate 40 MG/ML Outcome: tolerated well, no immediate complications Procedure, treatment alternatives, risks and benefits explained, specific risks discussed. Consent was given by the patient. Immediately prior to procedure a time out was called to verify the correct patient, procedure, equipment, support staff and site/side marked as required. Patient was prepped and draped in the usual sterile fashion.       Clinical Data: No additional findings.   Subjective: Chief Complaint  Patient presents with  . Right Shoulder - Pain    HPI [name] returns today due to her right shoulder pain she was last seen in October 2018 with right shoulder pain was given subacromial  injection at that time.  She did well until recently is now having increased shoulder pain is worse at night.  She is having no radicular symptoms down the arm.  She had no particular injury to the right shoulder. Review of Systems Please see HPI otherwise negative  Objective: Vital Signs: There were no vitals taken for this visit.  Physical Exam  Constitutional: She is oriented to person, place, and time. She appears well-developed and well-nourished. No distress.  Neurological: She is alert and oriented to person, place, and time.  Skin: She is not diaphoretic.  Psychiatric: She has a normal mood and affect.    Ortho Exam Right shoulder negative impingement sign 5 out of 5 strength bilateral shoulders with external and internal rotation against resistance.  Empty can test negative bilaterally.  Liftoff test is positive on the right negative on the left. Specialty Comments:  No specialty comments available.  Imaging: Xr Shoulder Right  Result Date: 06/12/2017 3 views right shoulder: Shoulders well located.  No acute fractures.  No bony abnormalities.  Subacromial space well maintained on the [place].    PMFS History: Patient Active Problem List   Diagnosis Date Noted  . Chronic right shoulder pain 02/13/2017  . Short Achilles tendon (acquired), left ankle 02/13/2017  . Dizziness and giddiness 02/16/2016  . Monoallelic mutation of CHEK2 gene 02/02/2015  .  Genetic testing 01/31/2015  . Altered mental status 10/25/2014  . Occipital neuralgia of left side   . Stroke ([place]) 10/07/2014  . History of permanent cardiac pacemaker placement 10/07/2014  . Cephalalgia   . Complicated migraine   . HLD (hyperlipidemia)   . Headache 10/06/2014  . Arthritis of left hip 06/12/2013  . Status post THR (total hip replacement) 06/12/2013  . Constipation 05/25/2013  . Orthostatic hypotension 01/27/2013  . crosstalk-atrial lead 01/27/2013  . Pre-syncope 01/02/2013  . Sinus tachycardia  01/01/2013  . Second degree heart block 12/27/2012  . Hypothyroidism 12/27/2012  . Spigelian hernia 04/20/2011  . DYSPNEA 05/09/2010  . BREAST CANCER 05/05/2010  . ASTHMA 05/05/2010  . HIATAL HERNIA 05/05/2010  . DEGENERATIVE Disk DISEASE 05/05/2010  . OSTEOPENIA 05/05/2010   Past Medical History:  Diagnosis Date  . ADHD (attention deficit hyperactivity disorder)   . Allergy    trees/pollen, mold, fungus, dust mites. Takes allergy shots  . Arthritis    PAIN AND OA LEFT HIP  . Asthma    allergist Dr Olena Heckle- monthly allergy injections  . Autonomic dysfunction    CENTRAL NERVOUS SYSTEM NEUROPATHY - DX BY DR. Erling Cruz MORE THAN 10 YRS AGO - AND IT IS FELT TO CONTRIBUTE TO THE AUTOMIC  DYSFUNCTION-- PT HAS NUMBNESS LEGS AND FEET AND SOMETIIMES TIPS OF FINGER, SEVERE CONSTIPATION( NO SENSATION TO HAVE BM ), DOUBLE VISION, ORTHOSTATIC HYPOTENSION  . Blood transfusion   . Breast cancer ([place])    right side  . Bruises easily   . Cataract   . Chest pain    a. 12/2012 Cath: nl cors, EF 55-65%.  . Complication of anesthesia    reaction to some anesthetics/ 7/12 anesth record on chart- states prefers epidural  . Depression   . Diverticulosis   . Dysrhythmia    HX OF HIGH GRADE HEART BLOCK - REQUIRED PACEMAKER INSERTION  . Esophageal stricture   . Gastritis   . GERD (gastroesophageal reflux disease)   . H/O hiatal hernia   . Hemorrhoids   . Hernia of abdominal wall    spigelian hernia RLQ - SURGERY TO REPAIR  . Hypothyroidism   . Interstitial cystitis   . Latex allergy, contact dermatitis   . Mallory - Weiss tear    HEALED   . Neuromuscular disorder (HCC)    central nervous system neuropathy- seen per Dr Erling Cruz  . Pacemaker   . Pericardial effusion    a. 12/2012 following ppm placement;  b. 01/01/2013 Echo: EF 55-60%, small pericardial effusion w/o RV collapse-->No need for tap/window.  Marland Kitchen PONV (postoperative nausea and vomiting)    pt needs scop patch  . Recurrent upper respiratory  infection (URI) 1/13- to present   bronchitis following surgery- states improved but still with cough. OV with Clearance Dr [name] 09/06/11 on chart  . Shortness of breath    AT TIMES - BUT MUCH IMPROVED AFTER PACEMAKER WAS REPROGRAMED.  Marland Kitchen Symptomatic bradycardia    a. 12/2012 s/p MDT dual chamber PPM, ser # [id number]; b. 12/2012 post-op course complicated by pericardial effusinon req lead revision.  . Thyroid disease     Family History  Problem Relation Age of Onset  . Heart disease Father   . Colon cancer Mother 69  . Depression Mother   . Pancreatic cancer Sister 7  . Diverticulitis Sister   . Uterine cancer Maternal Grandmother   . Heart disease Brother   . Heart disease Paternal Grandmother   . Heart disease Paternal Grandfather   .  Throat cancer Maternal Uncle   . Heart disease Paternal Aunt   . Heart disease Paternal Uncle   . Heart disease Brother   . Thrombosis Maternal Aunt   . Cancer Maternal Uncle        NOS  . Diabetes Unknown        aunt    Past Surgical History:  Procedure Laterality Date  . ABDOMINAL HYSTERECTOMY  1974  . BACK SURGERY     cervical fusion 4-5 with plate  . BLADDER SUSPENSION    . BREAST BIOPSY  2002   NO BLOOD PRESSURES ON RIGHT SIDE/   s/p  axillary node dissection  . CYSTO WITH HYDRODISTENSION  09/07/2011   Procedure: CYSTOSCOPY/HYDRODISTENSION;  Surgeon: Ailene Rud, MD;  Location: [facility];  Service: Urology;  Laterality: N/A;  INSTILLATION OF MARCAINE/PYRIDIUM INSTILLATION OF MARCAINE/KENALOG  . CYSTOSCOPY  1975, 2007  . EYE SURGERY     LASIK EYE SURGERY BILATERAL  . LAPAROSCOPY  1973  . LEAD REVISION N/A 12/30/2012   Procedure: LEAD REVISION;  Surgeon: Evans Lance, MD;  Location: [facility] CATH LAB;  Service: Cardiovascular;  Laterality: N/A;  . LEFT HEART CATHETERIZATION WITH CORONARY ANGIOGRAM N/A 12/29/2012   Procedure: LEFT HEART CATHETERIZATION WITH CORONARY ANGIOGRAM;  Surgeon: Peter M Martinique, MD;  Location: [facility] CATH LAB;   Service: Cardiovascular;  Laterality: N/A;  . MASTECTOMY MODIFIED RADICAL     right; with immediate reconstruction  . MYRINGOPLASTY  1962  . NECK SURGERY     c4-5 ruptured disc  . OOPHORECTOMY  1982  . PACEMAKER INSERTION    . PERMANENT PACEMAKER INSERTION N/A 12/29/2012   Procedure: PERMANENT PACEMAKER INSERTION;  Surgeon: Deboraha Sprang, MD;  Location: [facility] CATH LAB;  Service: Cardiovascular;  Laterality: N/A;  . RECTOCELE REPAIR     with cystocele repair  . TONSILLECTOMY    . TOTAL HIP ARTHROPLASTY Right   . TOTAL HIP ARTHROPLASTY Left 06/12/2013   Procedure: LEFT TOTAL HIP ARTHROPLASTY ANTERIOR APPROACH;  Surgeon: Mcarthur Rossetti, MD;  Location: [facility];  Service: Orthopedics;  Laterality: Left;  . TYMPANOPLASTY  1973   right  . ULNAR NERVE TRANSPOSITION Right   . VENTRAL HERNIA REPAIR  05/30/2011   Procedure: HERNIA REPAIR VENTRAL ADULT;  Surgeon: Haywood Lasso, MD;  Location: [place] [facility];  Service: General;  Laterality: Right;  repair right spigelian hernia   Social History   Occupational History  . 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 Smoking status: Never Smoker  . Smokeless tobacco: Never Used  Substance and Sexual Activity  . Alcohol use: Yes    Alcohol/week: 4.2 oz    Types: 7 Glasses of wine per week    Comment: occasional  . Drug use: No  . Sexual activity: Not on file

## 2017-06-25 LAB — CUP PACEART REMOTE DEVICE CHECK
Battery Remaining Longevity: 65 mo
Brady Statistic AP VS Percent: 0 %
Brady Statistic RA Percent Paced: 13.41 %
Date Time Interrogation Session: 20190123184944
Implantable Lead Implant Date: 20140818
Implantable Lead Location: 753860
Implantable Lead Model: 5076
Lead Channel Impedance Value: 342 Ohm
Lead Channel Pacing Threshold Amplitude: 1 V
Lead Channel Pacing Threshold Pulse Width: 0.4 ms
Lead Channel Sensing Intrinsic Amplitude: 1.625 mV
Lead Channel Sensing Intrinsic Amplitude: 1.625 mV
Lead Channel Setting Sensing Sensitivity: 0.9 mV
MDC IDC LEAD IMPLANT DT: 20140818
MDC IDC LEAD LOCATION: 753859
MDC IDC MSMT BATTERY VOLTAGE: 3 V
MDC IDC MSMT LEADCHNL RA IMPEDANCE VALUE: 475 Ohm
MDC IDC MSMT LEADCHNL RV IMPEDANCE VALUE: 589 Ohm
MDC IDC MSMT LEADCHNL RV IMPEDANCE VALUE: 627 Ohm
MDC IDC MSMT LEADCHNL RV PACING THRESHOLD AMPLITUDE: 0.625 V
MDC IDC MSMT LEADCHNL RV PACING THRESHOLD PULSEWIDTH: 0.4 ms
MDC IDC MSMT LEADCHNL RV SENSING INTR AMPL: 23.75 mV
MDC IDC MSMT LEADCHNL RV SENSING INTR AMPL: 23.75 mV
MDC IDC PG IMPLANT DT: 20140818
MDC IDC SET LEADCHNL RA PACING AMPLITUDE: 2 V
MDC IDC SET LEADCHNL RV PACING AMPLITUDE: 2.5 V
MDC IDC SET LEADCHNL RV PACING PULSEWIDTH: 0.4 ms
MDC IDC STAT BRADY AP VP PERCENT: 13.41 %
MDC IDC STAT BRADY AS VP PERCENT: 86.57 %
MDC IDC STAT BRADY AS VS PERCENT: 0.02 %
MDC IDC STAT BRADY RV PERCENT PACED: 99.98 %

## 2017-06-26 ENCOUNTER — Encounter (INDEPENDENT_AMBULATORY_CARE_PROVIDER_SITE_OTHER): Payer: Self-pay | Admitting: Orthopaedic Surgery

## 2017-06-26 ENCOUNTER — Ambulatory Visit (INDEPENDENT_AMBULATORY_CARE_PROVIDER_SITE_OTHER): Payer: Medicare Other | Admitting: Orthopaedic Surgery

## 2017-06-26 DIAGNOSIS — M25511 Pain in right shoulder: Secondary | ICD-10-CM | POA: Diagnosis not present

## 2017-06-26 DIAGNOSIS — G8929 Other chronic pain: Secondary | ICD-10-CM | POA: Diagnosis not present

## 2017-06-26 NOTE — Progress Notes (Signed)
Office Visit Note   Patient: [name]           Date of Birth: [birth year]           MRN: [id number] Visit Date: 06/26/2017              Requested by: Burnard Bunting, MD [address] [place],  [address] PCP: Burnard Bunting, MD   Assessment & Plan: Visit Diagnoses:  1. Chronic right shoulder pain     Plan: She is making improvements but I do feel that she would do better with formal physical therapy and her shoulder.  I talked her about this in length and she does agree to try outpatient physical therapy.  We will see her back in 6 weeks to see how is done.  Again she is already had 2 injections in the latter option would be consider shoulder surgery if it comes to it.  Follow-Up Instructions: Return in about 6 weeks (around 08/07/2017).   Orders:  No orders of the defined types were placed in this encounter.  No orders of the defined types were placed in this encounter.     Procedures: No procedures performed   Clinical Data: No additional findings.   Subjective: Chief Complaint  Patient presents with  . Right Shoulder - Follow-up  The patient comes in with continued right shoulder complaints.  We have at least injected his shoulder twice.  She did her mobility is great and she is work on a Thera-Band to increase her age of motion through exercises and increase her strength but the pain is still there.  Again she has had 2 steroid injections already in the subacromial space.  She denies any neck pain denies any numbness and tingling going down that arm.  She currently denies any headache, chest pain, shortness of breath, fever, chills, nausea, vomiting.  HPI  Review of Systems   Objective: Vital Signs: There were no vitals taken for this visit.  Physical Exam She is alert and oriented x3 in no acute  distress Ortho Exam Examination of right shoulder shows full range of motion with an intact rotator cuff in terms of strength and mobility.   She has pain in the subacromial outlet and over the proximal biceps tendon. Specialty Comments:  No specialty comments available.  Imaging: No results found.   PMFS History: Patient Active Problem List   Diagnosis Date Noted  . Chronic right shoulder pain 02/13/2017  . Short Achilles tendon (acquired), left ankle 02/13/2017  . Dizziness and giddiness 02/16/2016  . Monoallelic mutation of CHEK2 gene 02/02/2015  . Genetic testing 01/31/2015  . Altered mental status 10/25/2014  . Occipital neuralgia of left side   . Stroke ([place]) 10/07/2014  . History of permanent cardiac pacemaker placement 10/07/2014  . Cephalalgia   . Complicated migraine   . HLD (hyperlipidemia)   . Headache 10/06/2014  . Arthritis of left hip 06/12/2013  . Status post THR (total hip replacement) 06/12/2013  . Constipation 05/25/2013  . Orthostatic hypotension 01/27/2013  . crosstalk-atrial lead 01/27/2013  . Pre-syncope 01/02/2013  . Sinus tachycardia 01/01/2013  . Second degree heart block 12/27/2012  . Hypothyroidism 12/27/2012  . Spigelian hernia 04/20/2011  . DYSPNEA 05/09/2010  . BREAST CANCER 05/05/2010  . ASTHMA 05/05/2010  . HIATAL HERNIA 05/05/2010  . DEGENERATIVE Disk DISEASE 05/05/2010  . OSTEOPENIA 05/05/2010   Past Medical History:  Diagnosis Date  . ADHD (attention deficit hyperactivity disorder)   . Allergy  trees/pollen, mold, fungus, dust mites. Takes allergy shots  . Arthritis    PAIN AND OA LEFT HIP  . Asthma    allergist Dr Olena Heckle- monthly allergy injections  . Autonomic dysfunction    CENTRAL NERVOUS SYSTEM NEUROPATHY - DX BY DR. Erling Cruz MORE THAN 10 YRS AGO - AND IT IS FELT TO CONTRIBUTE TO THE AUTOMIC  DYSFUNCTION-- PT HAS NUMBNESS LEGS AND FEET AND SOMETIIMES TIPS OF FINGER, SEVERE CONSTIPATION( NO SENSATION TO HAVE BM ), DOUBLE VISION, ORTHOSTATIC HYPOTENSION  . Blood transfusion   . Breast cancer ([place])    right side  . Bruises easily   . Cataract   . Chest pain    a.  12/2012 Cath: nl cors, EF 55-65%.  . Complication of anesthesia    reaction to some anesthetics/ 7/12 anesth record on chart- states prefers epidural  . Depression   . Diverticulosis   . Dysrhythmia    HX OF HIGH GRADE HEART BLOCK - REQUIRED PACEMAKER INSERTION  . Esophageal stricture   . Gastritis   . GERD (gastroesophageal reflux disease)   . H/O hiatal hernia   . Hemorrhoids   . Hernia of abdominal wall    spigelian hernia RLQ - SURGERY TO REPAIR  . Hypothyroidism   . Interstitial cystitis   . Latex allergy, contact dermatitis   . Mallory - Weiss tear    HEALED   . Neuromuscular disorder (HCC)    central nervous system neuropathy- seen per Dr Erling Cruz  . Pacemaker   . Pericardial effusion    a. 12/2012 following ppm placement;  b. 01/01/2013 Echo: EF 55-60%, small pericardial effusion w/o RV collapse-->No need for tap/window.  Marland Kitchen PONV (postoperative nausea and vomiting)    pt needs scop patch  . Recurrent upper respiratory infection (URI) 1/13- to present   bronchitis following surgery- states improved but still with cough. OV with [facility] Dr [name] 09/06/11 on chart  . Shortness of breath    AT TIMES - BUT MUCH IMPROVED AFTER PACEMAKER WAS REPROGRAMED.  Marland Kitchen Symptomatic bradycardia    a. 12/2012 s/p MDT dual chamber PPM, ser # [id number]; b. 12/2012 post-op course complicated by pericardial effusinon req lead revision.  . Thyroid disease     Family History  Problem Relation Age of Onset  . Heart disease Father   . Colon cancer Mother 10  . Depression Mother   . Pancreatic cancer Sister 66  . Diverticulitis Sister   . Uterine cancer Maternal Grandmother   . Heart disease Brother   . Heart disease Paternal Grandmother   . Heart disease Paternal Grandfather   . Throat cancer Maternal Uncle   . Heart disease Paternal Aunt   . Heart disease Paternal Uncle   . Heart disease Brother   . Thrombosis Maternal Aunt   . Cancer Maternal Uncle        NOS  . Diabetes Unknown         aunt    Past Surgical History:  Procedure Laterality Date  . ABDOMINAL HYSTERECTOMY  1974  . BACK SURGERY     cervical fusion 4-5 with plate  . BLADDER SUSPENSION    . BREAST BIOPSY  2002   NO BLOOD PRESSURES ON RIGHT SIDE/   s/p  axillary node dissection  . CYSTO WITH HYDRODISTENSION  09/07/2011   Procedure: CYSTOSCOPY/HYDRODISTENSION;  Surgeon: Ailene Rud, MD;  [facility];  Service: Urology;  Laterality: N/A;  INSTILLATION OF MARCAINE/PYRIDIUM INSTILLATION OF MARCAINE/KENALOG  . CYSTOSCOPY  1975,  2007  . EYE SURGERY     LASIK EYE SURGERY BILATERAL  . LAPAROSCOPY  1973  . LEAD REVISION N/A 12/30/2012   Procedure: LEAD REVISION;  Surgeon: Evans Lance, MD;  Location: [facility] CATH LAB;  Service: Cardiovascular;  Laterality: N/A;  . LEFT HEART CATHETERIZATION WITH CORONARY ANGIOGRAM N/A 12/29/2012   Procedure: LEFT HEART CATHETERIZATION WITH CORONARY ANGIOGRAM;  Surgeon: Peter M Martinique, MD;  Location: [facility] CATH LAB;  Service: Cardiovascular;  Laterality: N/A;  . MASTECTOMY MODIFIED RADICAL     right; with immediate reconstruction  . MYRINGOPLASTY  1962  . NECK SURGERY     c4-5 ruptured disc  . OOPHORECTOMY  1982  . PACEMAKER INSERTION    . PERMANENT PACEMAKER INSERTION N/A 12/29/2012   Procedure: PERMANENT PACEMAKER INSERTION;  Surgeon: Deboraha Sprang, MD;  Location: [facility] CATH LAB;  Service: Cardiovascular;  Laterality: N/A;  . RECTOCELE REPAIR     with cystocele repair  . TONSILLECTOMY    . TOTAL HIP ARTHROPLASTY Right   . TOTAL HIP ARTHROPLASTY Left 06/12/2013   Procedure: LEFT TOTAL HIP ARTHROPLASTY ANTERIOR APPROACH;  Surgeon: Mcarthur Rossetti, MD;  Location: [facility];  Service: Orthopedics;  Laterality: Left;  . TYMPANOPLASTY  1973   right  . ULNAR NERVE TRANSPOSITION Right   . VENTRAL HERNIA REPAIR  05/30/2011   Procedure: HERNIA REPAIR VENTRAL ADULT;  Surgeon: Haywood Lasso, MD;  Location: [place];  Service: General;  Laterality: Right;   repair right spigelian hernia   Social History   Occupational History  . 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 Smoking status: Never Smoker  . Smokeless tobacco: Never Used  Substance and Sexual Activity  . Alcohol use: Yes    Alcohol/week: 4.2 oz    Types: 7 Glasses of wine per week    Comment: occasional  . Drug use: No  . Sexual activity: Not on file

## 2017-06-27 ENCOUNTER — Ambulatory Visit (INDEPENDENT_AMBULATORY_CARE_PROVIDER_SITE_OTHER): Payer: Medicare Other | Admitting: *Deleted

## 2017-06-27 DIAGNOSIS — J309 Allergic rhinitis, unspecified: Secondary | ICD-10-CM

## 2017-07-02 DIAGNOSIS — M25511 Pain in right shoulder: Secondary | ICD-10-CM | POA: Diagnosis not present

## 2017-07-09 DIAGNOSIS — M25511 Pain in right shoulder: Secondary | ICD-10-CM | POA: Diagnosis not present

## 2017-07-11 DIAGNOSIS — M25511 Pain in right shoulder: Secondary | ICD-10-CM | POA: Diagnosis not present

## 2017-07-16 DIAGNOSIS — K219 Gastro-esophageal reflux disease without esophagitis: Secondary | ICD-10-CM | POA: Diagnosis not present

## 2017-07-16 DIAGNOSIS — R05 Cough: Secondary | ICD-10-CM | POA: Diagnosis not present

## 2017-07-16 DIAGNOSIS — K224 Dyskinesia of esophagus: Secondary | ICD-10-CM | POA: Diagnosis not present

## 2017-07-18 DIAGNOSIS — M25511 Pain in right shoulder: Secondary | ICD-10-CM | POA: Diagnosis not present

## 2017-07-23 ENCOUNTER — Ambulatory Visit (INDEPENDENT_AMBULATORY_CARE_PROVIDER_SITE_OTHER): Payer: Medicare Other | Admitting: *Deleted

## 2017-07-23 DIAGNOSIS — M25511 Pain in right shoulder: Secondary | ICD-10-CM | POA: Diagnosis not present

## 2017-07-23 DIAGNOSIS — J309 Allergic rhinitis, unspecified: Secondary | ICD-10-CM

## 2017-07-25 DIAGNOSIS — M25511 Pain in right shoulder: Secondary | ICD-10-CM | POA: Diagnosis not present

## 2017-07-31 ENCOUNTER — Ambulatory Visit (INDEPENDENT_AMBULATORY_CARE_PROVIDER_SITE_OTHER): Payer: Medicare Other | Admitting: Allergy and Immunology

## 2017-07-31 ENCOUNTER — Encounter: Payer: Self-pay | Admitting: Allergy and Immunology

## 2017-07-31 VITALS — BP 116/72 | HR 65 | Resp 17

## 2017-07-31 DIAGNOSIS — K224 Dyskinesia of esophagus: Secondary | ICD-10-CM | POA: Diagnosis not present

## 2017-07-31 DIAGNOSIS — K219 Gastro-esophageal reflux disease without esophagitis: Secondary | ICD-10-CM | POA: Diagnosis not present

## 2017-07-31 DIAGNOSIS — J3089 Other allergic rhinitis: Secondary | ICD-10-CM

## 2017-07-31 NOTE — Progress Notes (Signed)
Follow-up Note  Referring Provider: Burnard Bunting, MD Primary Provider: Burnard Bunting, MD Date of Office Visit: 07/31/2017  Subjective:   [name] (DOB: [birth year]) is a 72 y.o. female who returns to the [facility] [place] on 07/31/2017 in re-evaluation of the following:  HPI: Lakindra returns to this clinic in reevaluation of her allergic rhinitis treated with immunotherapy and LPR with esophageal dysmotility. Her last visit to this clinic was 26 March 2017.  In the face of immunotherapy and the use of anti-inflammatory agents for her respiratory tract and pretty aggressive therapy directed against reflux she still continues to have lots of drainage in the back of her throat and throat clearing and still has postprandial coughing.  She has had a thorough evaluation with her gastroenterologist including investigation of her esophageal dysmotility and most recently he has changed her Nexium to AcipHex which really has not made any significant improvement regarding her symptoms.  She has very little issue with nasal congestion or sneezing and she can smell without any problem.  She still has lots of postnasal drip.  She was intolerant of using Nasacort because it gave rise to irritation of her nose.  She continues on a leukotriene modifier and an antihistamine.  Her immunotherapy is going well presently at every 3 weeks without any adverse effect.  Allergies as of 07/31/2017      Reactions   Anesthetics, Amide Other (See Comments)   Patient unsure of names, however multiples cause swelling of airway & nausea   Clindamycin Swelling   Neomycin Swelling   Shellfish Allergy Other (See Comments)   Neurotoxic reaction   Sulfonamide Derivatives Anaphylaxis, Swelling   Zolpidem Tartrate Other (See Comments)   Jerking motions    Azithromycin Other (See Comments)   Severe gastritis    Bactroban Other (See Comments)   Causes sores in nose   Ciprofloxacin    REACTION: joint swelling   Codeine Swelling   Meperidine Hcl Nausea Only   Hallucinations   Morphine Nausea Only   Hallucinations    Neosporin [neomycin-polymyxin-gramicidin] Hives, Dermatitis   All topical "orin's ointment"   Nitrofurantoin    REACTION: neuropathy in legs   Other    ALLERGIC TO WARM WATER SHELLFISH PT STATES PLEASE NOTE SHE CAN TAKE OXYCODONE AND TYLENOL FOR PAIN -NOT ALLERGIC   Penicillins Other (See Comments)   Swelling in joints   Tramadol    Makes jerk   Latex Rash      Medication List      acetaminophen 325 MG tablet Commonly known as:  TYLENOL Take 2 tablets (650 mg total) by mouth every 6 (six) hours as needed for mild pain.   albuterol 108 (90 Base) MCG/ACT inhaler Commonly known as:  PROVENTIL HFA;VENTOLIN HFA Inhale 2 puffs into the lungs every 6 (six) hours as needed. For shortness of breath   ALIGN 4 MG Caps Take 4 mg by mouth at bedtime.   b complex vitamins capsule Take 1 capsule by mouth daily.   cephALEXin 500 MG capsule Commonly known as:  KEFLEX TAKE 4 CAPSULES BY MOUTH 1 HOUR PRIOR TO APPOINTMENT   cetirizine 10 MG tablet Commonly known as:  ZYRTEC Take by mouth.   cholecalciferol 1000 units tablet Commonly known as:  VITAMIN D Take 1,000 Units by mouth daily.   cycloSPORINE 0.05 % ophthalmic emulsion Commonly known as:  RESTASIS Apply to eye.   EPINEPHrine 0.3 mg/0.3 mL Soaj injection Commonly known as:  EPIPEN 2-PAK Inject  0.3 mLs (0.3 mg total) into the muscle once.   escitalopram 10 MG tablet Commonly known as:  LEXAPRO Take 10 mg by mouth daily.   estradiol 0.025 MG/24HR Commonly known as:  VIVELLE-DOT Place 1 patch onto the skin 2 (two) times a week. Applies new patch on Sunday & Thursday   estradiol 2 MG vaginal ring Commonly known as:  ESTRING Place 2 mg vaginally every 3 (three) months. follow package directions   fluocinonide 0.05 % external solution Commonly known as:  LIDEX 1 APPLICATION ON THE  SKIN AS DIRECTED. APPLY TO SCALP BID FOR 1-2 WEEKS PRN FLARE AS DIRECTED   KLOR-CON M10 10 MEQ tablet Generic drug:  potassium chloride TAKE 1 TABLET DAILY   levothyroxine 88 MCG tablet Commonly known as:  SYNTHROID, LEVOTHROID Take 100 mcg by mouth daily before breakfast.   meclizine 25 MG tablet Commonly known as:  ANTIVERT Take 0.5 tablets (12.5 mg total) by mouth 3 (three) times daily.   Melatonin 1 MG Tabs Take 1.5 tablets by mouth at bedtime as needed.   montelukast 10 MG tablet Commonly known as:  SINGULAIR Take 1 tablet (10 mg total) daily by mouth.   PRESCRIPTION MEDICATION ALLERGY SHOT EVERY 2 WEEKS BY DR. Neldon Mc   ranitidine 300 MG capsule Commonly known as:  ZANTAC Take 1 capsule (300 mg total) every evening by mouth.   vitamin C 500 MG tablet Commonly known as:  ASCORBIC ACID Take by mouth.       Past Medical History:  Diagnosis Date  . ADHD (attention deficit hyperactivity disorder)   . Allergy    trees/pollen, mold, fungus, dust mites. Takes allergy shots  . Arthritis    PAIN AND OA LEFT HIP  . Asthma    allergist Dr Olena Heckle- monthly allergy injections  . Autonomic dysfunction    CENTRAL NERVOUS SYSTEM NEUROPATHY - DX BY DR. Erling Cruz MORE THAN 10 YRS AGO - AND IT IS FELT TO CONTRIBUTE TO THE AUTOMIC  DYSFUNCTION-- PT HAS NUMBNESS LEGS AND FEET AND SOMETIIMES TIPS OF FINGER, SEVERE CONSTIPATION( NO SENSATION TO HAVE BM ), DOUBLE VISION, ORTHOSTATIC HYPOTENSION  . Blood transfusion   . Breast cancer ([facility])    right side  . Bruises easily   . Cataract   . Chest pain    a. 12/2012 Cath: nl cors, EF 55-65%.  . Complication of anesthesia    reaction to some anesthetics/ 7/12 anesth record on chart- states prefers epidural  . Depression   . Diverticulosis   . Dysrhythmia    HX OF HIGH GRADE HEART BLOCK - REQUIRED PACEMAKER INSERTION  . Esophageal stricture   . Gastritis   . GERD (gastroesophageal reflux disease)   . H/O hiatal hernia   . Hemorrhoids   .  Hernia of abdominal wall    spigelian hernia RLQ - SURGERY TO REPAIR  . Hypothyroidism   . Interstitial cystitis   . Latex allergy, contact dermatitis   . Mallory - Weiss tear    HEALED   . Neuromuscular disorder ([facility])    central nervous system neuropathy- seen per Dr Erling Cruz  . Pacemaker   . Pericardial effusion    a. 12/2012 following ppm placement;  b. 01/01/2013 Echo: EF 55-60%, small pericardial effusion w/o RV collapse-->No need for tap/window.  Marland Kitchen PONV (postoperative nausea and vomiting)    pt needs scop patch  . Recurrent upper respiratory infection (URI) 1/13- to present   bronchitis following surgery- states improved but still with cough. OV with  Clearance Dr [name] 09/06/11 on chart  . Shortness of breath    AT TIMES - BUT MUCH IMPROVED AFTER PACEMAKER WAS REPROGRAMED.  Marland Kitchen Symptomatic bradycardia    a. 12/2012 s/p MDT dual chamber PPM, ser # [id number]; b. 12/2012 post-op course complicated by pericardial effusinon req lead revision.  . Thyroid disease     Past Surgical History:  Procedure Laterality Date  . ABDOMINAL HYSTERECTOMY  1974  . BACK SURGERY     cervical fusion 4-5 with plate  . BLADDER SUSPENSION    . BREAST BIOPSY  2002   NO BLOOD PRESSURES ON RIGHT SIDE/   s/p  axillary node dissection  . CYSTO WITH HYDRODISTENSION  09/07/2011   Procedure: CYSTOSCOPY/HYDRODISTENSION;  Surgeon: Ailene Rud, MD;  Location: [facility];  Service: Urology;  Laterality: N/A;  INSTILLATION OF MARCAINE/PYRIDIUM INSTILLATION OF MARCAINE/KENALOG  . CYSTOSCOPY  1975, 2007  . EYE SURGERY     LASIK EYE SURGERY BILATERAL  . LAPAROSCOPY  1973  . LEAD REVISION N/A 12/30/2012   Procedure: LEAD REVISION;  Surgeon: Evans Lance, MD;  Location: [facility] CATH LAB;  Service: Cardiovascular;  Laterality: N/A;  . LEFT HEART CATHETERIZATION WITH CORONARY ANGIOGRAM N/A 12/29/2012   Procedure: LEFT HEART CATHETERIZATION WITH CORONARY ANGIOGRAM;  Surgeon: Peter M Martinique, MD;  Location: [facility] C[facility] LAB;   Service: Cardiovascular;  Laterality: N/A;  . MASTECTOMY MODIFIED RADICAL     right; with immediate reconstruction  . MYRINGOPLASTY  1962  . NECK SURGERY     c4-5 ruptured disc  . OOPHORECTOMY  1982  . PACEMAKER INSERTION    . PERMANENT PACEMAKER INSERTION N/A 12/29/2012   Procedure: PERMANENT PACEMAKER INSERTION;  Surgeon: Deboraha Sprang, MD;  Location: [facility] CATH LAB;  Service: Cardiovascular;  Laterality: N/A;  . RECTOCELE REPAIR     with cystocele repair  . TONSILLECTOMY    . TOTAL HIP ARTHROPLASTY Right   . TOTAL HIP ARTHROPLASTY Left 06/12/2013   Procedure: LEFT TOTAL HIP ARTHROPLASTY ANTERIOR APPROACH;  Surgeon: Mcarthur Rossetti, MD;  Location: [facility];  Service: Orthopedics;  Laterality: Left;  . TYMPANOPLASTY  1973   right  . ULNAR NERVE TRANSPOSITION Right   . VENTRAL HERNIA REPAIR  05/30/2011   Procedure: HERNIA REPAIR VENTRAL ADULT;  Surgeon: Haywood Lasso, MD;  Location: [facility];  Service: General;  Laterality: Right;  repair right spigelian hernia    Review of systems negative except as noted in HPI / PMHx or noted below:  Review of Systems  Constitutional: Negative.   HENT: Negative.   Eyes: Negative.   Respiratory: Negative.   Cardiovascular: Negative.   Gastrointestinal: Negative.   Genitourinary: Negative.   Musculoskeletal: Negative.   Skin: Negative.   Neurological: Negative.   Endo/Heme/Allergies: Negative.   Psychiatric/Behavioral: Negative.      Objective:   Vitals:   07/31/17 1021  BP: 116/72  Pulse: 65  Resp: 17  SpO2: 96%          Physical Exam  Constitutional: She is well-developed, well-nourished, and in no distress.  Throat clearing  HENT:  Head: Normocephalic.  Right Ear: Tympanic membrane, external ear and ear canal normal.  Left Ear: Tympanic membrane, external ear and ear canal normal.  Nose: Nose normal. No mucosal edema or rhinorrhea.  Mouth/Throat: Uvula is midline, oropharynx is clear and moist and  mucous membranes are normal. No oropharyngeal exudate.  Eyes: Conjunctivae are normal.  Neck: Trachea normal. No tracheal tenderness present. No tracheal deviation  present. No thyromegaly present.  Cardiovascular: Normal rate, regular rhythm, S1 normal, S2 normal and normal heart sounds.  No murmur heard. Pulmonary/Chest: Breath sounds normal. No stridor. No respiratory distress. She has no wheezes. She has no rales.  Musculoskeletal: She exhibits no edema.  Lymphadenopathy:       Head (right side): No tonsillar adenopathy present.       Head (left side): No tonsillar adenopathy present.    She has no cervical adenopathy.  Neurological: She is alert. Gait normal.  Skin: No rash noted. She is not diaphoretic. No erythema. Nails show no clubbing.  Psychiatric: Mood and affect normal.    Diagnostics: none  Assessment and Plan:   1. Other allergic rhinitis   2. LPRD (laryngopharyngeal reflux disease)   3. Esophageal dysmotility     1. Continue Nexium 40 or Aciphex 20 mg in a.m.  2. Continue ranitidine 300 mg in PM. Can discontinue to see if needed  3. Continue cetirizine/Zyrtec to 10 mg twice a day  4. Continue montelukast 10 mg one time per day  5. Start OTC Rhinocort one spray each nostril one time per day  6.  Continue immunotherapy. Increase to 0.3 ml with next injection  7. Continue EpiPen if needed  8. Eliminate all caffeine consumption  9. Can try OTC Mucinex DM 1-2 tablets 1-2 times a day  10. Return Summer 2019 or earlier if problem  From an allergy standpoint I think that Meg is maximally treated and she will continue on immunotherapy and other anti-inflammatory agents for her respiratory tract as noted above.  I am not sure that her reflux and motility disorder is under good control while she continues to use a large collection of medical therapy directed against this condition but I am not sure there is much else that can be done at this point.  She is interested  in seeing if she actually does need ranitidine and she can certainly perform that experiment by discontinuing this agent to see what the result is without use of a H2 receptor blocker.  We will increase her immunotherapy dose today as noted above.  I will see her back in this clinic in the summer 2019 or earlier if there is a problem.  Allena Katz, MD Allergy / Immunology [place]

## 2017-07-31 NOTE — Patient Instructions (Addendum)
  1. Continue Nexium 40 or Aciphex 20 mg in a.m.  2. Continue ranitidine 300 mg in PM. Can discontinue to see if needed  3. Continue cetirizine/Zyrtec to 10 mg twice a day  4. Continue montelukast 10 mg one time per day  5. Start OTC Rhinocort one spray each nostril one time per day  6.  Continue immunotherapy. Increase to 0.3 ml with next injection  7. Continue EpiPen if needed  8. Eliminate all caffeine consumption  9. Can try OTC Mucinex DM 1-2 tablets 1-2 times a day  10. Return Summer 2019 or earlier if problem

## 2017-08-01 ENCOUNTER — Encounter: Payer: Self-pay | Admitting: Allergy and Immunology

## 2017-08-01 DIAGNOSIS — M25511 Pain in right shoulder: Secondary | ICD-10-CM | POA: Diagnosis not present

## 2017-08-06 DIAGNOSIS — M25511 Pain in right shoulder: Secondary | ICD-10-CM | POA: Diagnosis not present

## 2017-08-07 ENCOUNTER — Encounter (INDEPENDENT_AMBULATORY_CARE_PROVIDER_SITE_OTHER): Payer: Self-pay | Admitting: Orthopaedic Surgery

## 2017-08-07 ENCOUNTER — Ambulatory Visit (INDEPENDENT_AMBULATORY_CARE_PROVIDER_SITE_OTHER): Payer: Medicare Other | Admitting: Orthopaedic Surgery

## 2017-08-07 DIAGNOSIS — G8929 Other chronic pain: Secondary | ICD-10-CM

## 2017-08-07 DIAGNOSIS — M25511 Pain in right shoulder: Secondary | ICD-10-CM | POA: Diagnosis not present

## 2017-08-07 NOTE — Progress Notes (Signed)
HPI: Mrs. Heick comes in today for follow-up of her right shoulder pain.  She states right shoulder is overall much better since going to physical therapy.  She is doing home exercise program.  Still has some tightness in the trapezius region.  Main concern today is multiple arthralgias including the shoulder wrist hands and feet.  She is concerned she may have rheumatoid arthritis.  She is asking about a referral to rheumatology her workup for rheumatoid arthritis.  She states she is very stiff in the morning takes her a while to get going better after the morning.   Physical exam: General well-developed well-nourished female no acute distress. Bilateral hands wrist she has no deformities of the wrist.  She does have some arthritic changes involving the index and middle finger right hand.  With slight flexion through the IP joints. Bilateral feet slight hallux valgus deformities with second toe cockup deformities. Right shoulder she has full forward flexion full abduction without pain.  Impression: Right shoulder pain improved Multiple arthralgias  Plan: This point time will draw a rheumatoid panel.  Have her follow-up for review of this approximately in 1 week to go over these labs.  She will continue physical therapy for her right shoulder.  Questions encouraged and answered at length by Dr. Ninfa Linden and  myself.

## 2017-08-08 DIAGNOSIS — M25511 Pain in right shoulder: Secondary | ICD-10-CM | POA: Diagnosis not present

## 2017-08-13 DIAGNOSIS — M25511 Pain in right shoulder: Secondary | ICD-10-CM | POA: Diagnosis not present

## 2017-08-15 ENCOUNTER — Telehealth: Payer: Self-pay | Admitting: *Deleted

## 2017-08-15 ENCOUNTER — Ambulatory Visit (INDEPENDENT_AMBULATORY_CARE_PROVIDER_SITE_OTHER): Payer: Medicare Other | Admitting: *Deleted

## 2017-08-15 DIAGNOSIS — M25511 Pain in right shoulder: Secondary | ICD-10-CM | POA: Diagnosis not present

## 2017-08-15 DIAGNOSIS — J309 Allergic rhinitis, unspecified: Secondary | ICD-10-CM

## 2017-08-15 NOTE — Telephone Encounter (Signed)
Patient received 0.3 today. Do you want to hold at that dose until next visit or continue to go up as tolerated? Please advise.

## 2017-08-15 NOTE — Telephone Encounter (Signed)
Lets stay at 0.3 for the next three months , then 0.4

## 2017-08-15 NOTE — Telephone Encounter (Signed)
Documented in immunotherapy tab

## 2017-08-20 ENCOUNTER — Telehealth (INDEPENDENT_AMBULATORY_CARE_PROVIDER_SITE_OTHER): Payer: Self-pay | Admitting: Orthopaedic Surgery

## 2017-08-20 DIAGNOSIS — M25511 Pain in right shoulder: Secondary | ICD-10-CM | POA: Diagnosis not present

## 2017-08-20 NOTE — Telephone Encounter (Signed)
Patient called needing results called into her over the phone if that is possible. Patient said if she is not available please leave message on phone.The number to contact patient is [phone]

## 2017-08-20 NOTE — Telephone Encounter (Signed)
I'm assuming she's talking about the labwork we drew

## 2017-08-21 NOTE — Telephone Encounter (Signed)
I just looked on the computer and I see no lab results from what was drawn in the office.  Thus I cannot call her until I know these results.  See what she can find out.  Thanks

## 2017-08-22 ENCOUNTER — Telehealth (INDEPENDENT_AMBULATORY_CARE_PROVIDER_SITE_OTHER): Payer: Self-pay | Admitting: Orthopaedic Surgery

## 2017-08-22 DIAGNOSIS — M25511 Pain in right shoulder: Secondary | ICD-10-CM | POA: Diagnosis not present

## 2017-08-22 NOTE — Telephone Encounter (Signed)
Please advise on labs, thanks.

## 2017-08-22 NOTE — Telephone Encounter (Signed)
Call [facility] to get lab results,

## 2017-08-22 NOTE — Telephone Encounter (Signed)
[name],  Patient called for her results. I advised per notes you ad reached out to the labs and waiting for the faxed results.  I advised someone would call her once the results were received.

## 2017-08-22 NOTE — Telephone Encounter (Signed)
IC [facility] and they will fax the labs to [place], [phone].

## 2017-08-26 DIAGNOSIS — J3089 Other allergic rhinitis: Secondary | ICD-10-CM | POA: Diagnosis not present

## 2017-08-26 DIAGNOSIS — E038 Other specified hypothyroidism: Secondary | ICD-10-CM | POA: Diagnosis not present

## 2017-08-26 DIAGNOSIS — R82998 Other abnormal findings in urine: Secondary | ICD-10-CM | POA: Diagnosis not present

## 2017-08-26 DIAGNOSIS — K219 Gastro-esophageal reflux disease without esophagitis: Secondary | ICD-10-CM | POA: Diagnosis not present

## 2017-08-26 DIAGNOSIS — J302 Other seasonal allergic rhinitis: Secondary | ICD-10-CM | POA: Diagnosis not present

## 2017-08-26 DIAGNOSIS — R05 Cough: Secondary | ICD-10-CM | POA: Diagnosis not present

## 2017-08-26 DIAGNOSIS — J4520 Mild intermittent asthma, uncomplicated: Secondary | ICD-10-CM | POA: Diagnosis not present

## 2017-08-26 DIAGNOSIS — R430 Anosmia: Secondary | ICD-10-CM | POA: Diagnosis not present

## 2017-08-26 NOTE — Telephone Encounter (Signed)
Put labs beside your computer

## 2017-08-26 NOTE — Telephone Encounter (Signed)
I already spoke to her last week and told her the lab results.

## 2017-08-26 NOTE — Telephone Encounter (Signed)
I gave Dr Ninfa Linden hard copies of labs, can you ask him about this and call patient? Thanks.

## 2017-08-28 ENCOUNTER — Encounter: Payer: Self-pay | Admitting: *Deleted

## 2017-08-28 NOTE — Progress Notes (Signed)
Maintenance vial made. Exp: 08-29-18. hv 

## 2017-09-02 DIAGNOSIS — H8193 Unspecified disorder of vestibular function, bilateral: Secondary | ICD-10-CM | POA: Diagnosis not present

## 2017-09-02 DIAGNOSIS — M25562 Pain in left knee: Secondary | ICD-10-CM | POA: Diagnosis not present

## 2017-09-02 DIAGNOSIS — G909 Disorder of the autonomic nervous system, unspecified: Secondary | ICD-10-CM | POA: Diagnosis not present

## 2017-09-02 DIAGNOSIS — Z6825 Body mass index (BMI) 25.0-25.9, adult: Secondary | ICD-10-CM | POA: Diagnosis not present

## 2017-09-02 DIAGNOSIS — E038 Other specified hypothyroidism: Secondary | ICD-10-CM | POA: Diagnosis not present

## 2017-09-02 DIAGNOSIS — M5412 Radiculopathy, cervical region: Secondary | ICD-10-CM | POA: Diagnosis not present

## 2017-09-02 DIAGNOSIS — I480 Paroxysmal atrial fibrillation: Secondary | ICD-10-CM | POA: Diagnosis not present

## 2017-09-02 DIAGNOSIS — M545 Low back pain: Secondary | ICD-10-CM | POA: Diagnosis not present

## 2017-09-02 DIAGNOSIS — Z9011 Acquired absence of right breast and nipple: Secondary | ICD-10-CM | POA: Diagnosis not present

## 2017-09-02 DIAGNOSIS — D692 Other nonthrombocytopenic purpura: Secondary | ICD-10-CM | POA: Diagnosis not present

## 2017-09-02 DIAGNOSIS — Z1389 Encounter for screening for other disorder: Secondary | ICD-10-CM | POA: Diagnosis not present

## 2017-09-02 DIAGNOSIS — M25552 Pain in left hip: Secondary | ICD-10-CM | POA: Diagnosis not present

## 2017-09-03 DIAGNOSIS — M25511 Pain in right shoulder: Secondary | ICD-10-CM | POA: Diagnosis not present

## 2017-09-04 ENCOUNTER — Other Ambulatory Visit: Payer: Self-pay | Admitting: Allergy and Immunology

## 2017-09-04 ENCOUNTER — Ambulatory Visit (INDEPENDENT_AMBULATORY_CARE_PROVIDER_SITE_OTHER): Payer: Medicare Other | Admitting: *Deleted

## 2017-09-04 DIAGNOSIS — I442 Atrioventricular block, complete: Secondary | ICD-10-CM | POA: Diagnosis not present

## 2017-09-04 DIAGNOSIS — J3089 Other allergic rhinitis: Secondary | ICD-10-CM | POA: Diagnosis not present

## 2017-09-04 NOTE — Progress Notes (Signed)
Remote pacemaker transmission.   

## 2017-09-05 ENCOUNTER — Other Ambulatory Visit: Payer: Self-pay | Admitting: Allergy and Immunology

## 2017-09-05 DIAGNOSIS — J301 Allergic rhinitis due to pollen: Secondary | ICD-10-CM | POA: Diagnosis not present

## 2017-09-05 NOTE — Telephone Encounter (Signed)
RF on ranitidine 300 mg x 90 with 1 refill at [facility]

## 2017-09-06 ENCOUNTER — Encounter: Payer: Self-pay | Admitting: Cardiology

## 2017-09-06 LAB — CUP PACEART REMOTE DEVICE CHECK
Battery Voltage: 2.99 V
Brady Statistic AP VP Percent: 15.6 %
Brady Statistic RA Percent Paced: 15.6 %
Date Time Interrogation Session: 20190424141251
Implantable Lead Implant Date: 20140818
Implantable Lead Location: 753860
Implantable Lead Model: 5076
Implantable Lead Model: 5076
Implantable Pulse Generator Implant Date: 20140818
Lead Channel Impedance Value: 342 Ohm
Lead Channel Impedance Value: 570 Ohm
Lead Channel Pacing Threshold Amplitude: 1.25 V
Lead Channel Pacing Threshold Pulse Width: 0.4 ms
Lead Channel Pacing Threshold Pulse Width: 0.4 ms
Lead Channel Sensing Intrinsic Amplitude: 1.375 mV
Lead Channel Setting Pacing Amplitude: 2.5 V
Lead Channel Setting Pacing Pulse Width: 0.4 ms
MDC IDC LEAD IMPLANT DT: 20140818
MDC IDC LEAD LOCATION: 753859
MDC IDC MSMT BATTERY REMAINING LONGEVITY: 57 mo
MDC IDC MSMT LEADCHNL RA IMPEDANCE VALUE: 456 Ohm
MDC IDC MSMT LEADCHNL RA SENSING INTR AMPL: 1.375 mV
MDC IDC MSMT LEADCHNL RV IMPEDANCE VALUE: 608 Ohm
MDC IDC MSMT LEADCHNL RV PACING THRESHOLD AMPLITUDE: 0.5 V
MDC IDC MSMT LEADCHNL RV SENSING INTR AMPL: 20.75 mV
MDC IDC MSMT LEADCHNL RV SENSING INTR AMPL: 20.75 mV
MDC IDC SET LEADCHNL RV PACING AMPLITUDE: 2.5 V
MDC IDC SET LEADCHNL RV SENSING SENSITIVITY: 0.9 mV
MDC IDC STAT BRADY AP VS PERCENT: 0 %
MDC IDC STAT BRADY AS VP PERCENT: 84.37 %
MDC IDC STAT BRADY AS VS PERCENT: 0.02 %
MDC IDC STAT BRADY RV PERCENT PACED: 99.96 %

## 2017-09-09 DIAGNOSIS — Z1231 Encounter for screening mammogram for malignant neoplasm of breast: Secondary | ICD-10-CM | POA: Diagnosis not present

## 2017-09-09 DIAGNOSIS — Z853 Personal history of malignant neoplasm of breast: Secondary | ICD-10-CM | POA: Diagnosis not present

## 2017-09-13 ENCOUNTER — Ambulatory Visit (INDEPENDENT_AMBULATORY_CARE_PROVIDER_SITE_OTHER): Payer: Medicare Other

## 2017-09-13 DIAGNOSIS — J309 Allergic rhinitis, unspecified: Secondary | ICD-10-CM | POA: Diagnosis not present

## 2017-09-24 DIAGNOSIS — G903 Multi-system degeneration of the autonomic nervous system: Secondary | ICD-10-CM | POA: Diagnosis not present

## 2017-09-24 DIAGNOSIS — I951 Orthostatic hypotension: Secondary | ICD-10-CM | POA: Diagnosis not present

## 2017-10-23 ENCOUNTER — Telehealth: Payer: Self-pay

## 2017-10-23 NOTE — Telephone Encounter (Signed)
Patient called and wanted to inform Dr. Neldon Mc that she will be stopping her allergy injections at the advice of her PCP and another physician. She stated she has a lot of autoimmune issues and believes her allergy injections could be pulling her immune system down more. She stated she has been on them for a long time and there is no changed.

## 2017-10-24 DIAGNOSIS — Z1212 Encounter for screening for malignant neoplasm of rectum: Secondary | ICD-10-CM | POA: Diagnosis not present

## 2017-12-04 ENCOUNTER — Ambulatory Visit (INDEPENDENT_AMBULATORY_CARE_PROVIDER_SITE_OTHER): Payer: Medicare Other | Admitting: *Deleted

## 2017-12-04 ENCOUNTER — Telehealth: Payer: Self-pay | Admitting: Cardiology

## 2017-12-04 DIAGNOSIS — I442 Atrioventricular block, complete: Secondary | ICD-10-CM | POA: Diagnosis not present

## 2017-12-04 NOTE — Telephone Encounter (Signed)
Spoke with pt and reminded pt of remote transmission that is due today. Pt verbalized understanding.   

## 2017-12-05 ENCOUNTER — Encounter: Payer: Self-pay | Admitting: Cardiology

## 2017-12-05 NOTE — Progress Notes (Signed)
Remote pacemaker transmission.   

## 2017-12-25 ENCOUNTER — Encounter: Payer: Self-pay | Admitting: Allergy and Immunology

## 2017-12-25 ENCOUNTER — Ambulatory Visit (INDEPENDENT_AMBULATORY_CARE_PROVIDER_SITE_OTHER): Payer: Medicare Other | Admitting: Allergy and Immunology

## 2017-12-25 VITALS — BP 104/62 | HR 62 | Resp 16 | Ht 67.0 in | Wt 163.0 lb

## 2017-12-25 DIAGNOSIS — K224 Dyskinesia of esophagus: Secondary | ICD-10-CM | POA: Diagnosis not present

## 2017-12-25 DIAGNOSIS — K219 Gastro-esophageal reflux disease without esophagitis: Secondary | ICD-10-CM | POA: Diagnosis not present

## 2017-12-25 DIAGNOSIS — J3089 Other allergic rhinitis: Secondary | ICD-10-CM

## 2017-12-25 NOTE — Patient Instructions (Addendum)
  1. Continue Aciphex 20 mg in a.m.  2. Continue ranitidine 300 mg in PM. Can discontinue to see if needed  3. Continue cetirizine/Zyrtec 10 mg 1-2 times a day  4. Continue montelukast 10 mg one time per day  5. Continue OTC nasal fluticasone one spray each nostril one time per day  6. Eliminate all caffeine consumption  7. Obtain fall flu vaccine  8. Return 6 months or earlier if problem

## 2017-12-25 NOTE — Progress Notes (Signed)
Follow-up Note  Referring Provider: Burnard Bunting, MD Primary Provider: Burnard Bunting, MD Date of Office Visit: 12/25/2017  Subjective:   [name] (DOB: 07/25/1945) is a 72 y.o. female who returns to the [facility] on 12/25/2017 in re-evaluation of the following:  HPI: [name] returns to this clinic in evaluation of her rhinitis and LPR and esophageal dysmotility.  Her last visit to this clinic was 31 July 2017.  Overall she is really done very well with her nose.  She is now using Flonase and montelukast and believes that her upper airway is doing relatively well. She has discontinued her immunotherapy.  Her reflux is doing very well.  She is using AcipHex and she still continues to use ranitidine at this point in time.  She did have an upper endoscopy performed with a dilation of her esophagus and ever since that dilation was performed she has much less throat clearing and does not have postprandial coughing.  Allergies as of 12/25/2017      Reactions   Anesthetics, Amide Other (See Comments)   Patient unsure of names, however multiples cause swelling of airway & nausea   Clindamycin Swelling   Neomycin Swelling   Shellfish Allergy Other (See Comments)   Neurotoxic reaction   Sulfonamide Derivatives Anaphylaxis, Swelling   Zolpidem Tartrate Other (See Comments)   Jerking motions    Azithromycin Other (See Comments)   Severe gastritis    Bactroban Other (See Comments)   Causes sores in nose   Ciprofloxacin    REACTION: joint swelling   Codeine Swelling   Meperidine Hcl Nausea Only   Hallucinations   Morphine Nausea Only   Hallucinations    Neosporin [neomycin-polymyxin-gramicidin] Hives, Dermatitis   All topical "orin's ointment"   Nitrofurantoin    REACTION: neuropathy in legs   Other    ALLERGIC TO WARM WATER SHELLFISH PT STATES PLEASE NOTE SHE CAN TAKE OXYCODONE AND TYLENOL FOR PAIN -NOT ALLERGIC   Penicillins Other (See Comments)     Swelling in joints   Tramadol    Makes jerk   Latex Rash      Medication List      acetaminophen 325 MG tablet Commonly known as:  TYLENOL Take 2 tablets (650 mg total) by mouth every 6 (six) hours as needed for mild pain.   albuterol 108 (90 Base) MCG/ACT inhaler Commonly known as:  PROVENTIL HFA;VENTOLIN HFA Inhale 2 puffs into the lungs every 6 (six) hours as needed. For shortness of breath   ALIGN 4 MG Caps Take 4 mg by mouth at bedtime.   b complex vitamins capsule Take 1 capsule by mouth daily.   cephALEXin 500 MG capsule Commonly known as:  KEFLEX TAKE 4 CAPSULES BY MOUTH 1 HOUR PRIOR TO APPOINTMENT   cetirizine 10 MG tablet Commonly known as:  ZYRTEC Take by mouth.   cholecalciferol 1000 units tablet Commonly known as:  VITAMIN D Take 1,000 Units by mouth daily.   cycloSPORINE 0.05 % ophthalmic emulsion Commonly known as:  RESTASIS Apply to eye.   EPINEPHrine 0.3 mg/0.3 mL Soaj injection Commonly known as:  EPI-PEN Inject 0.3 mLs (0.3 mg total) into the muscle once.   escitalopram 10 MG tablet Commonly known as:  LEXAPRO Take 10 mg by mouth daily.   estradiol 0.025 MG/24HR Commonly known as:  VIVELLE-DOT Place 1 patch onto the skin 2 (two) times a week. Applies new patch on Sunday & Thursday   estradiol 2 MG vaginal  ring Commonly known as:  ESTRING Place 2 mg vaginally every 3 (three) months. follow package directions   fluocinonide 0.05 % external solution Commonly known as:  LIDEX 1 APPLICATION ON THE SKIN AS DIRECTED. APPLY TO SCALP BID FOR 1-2 WEEKS PRN FLARE AS DIRECTED   fluticasone 50 MCG/ACT nasal spray Commonly known as:  FLONASE Place 1 spray into both nostrils daily.   KLOR-CON M10 10 MEQ tablet Generic drug:  potassium chloride TAKE 1 TABLET DAILY   levothyroxine 88 MCG tablet Commonly known as:  SYNTHROID, LEVOTHROID Take 100 mcg by mouth daily before breakfast.   meclizine 25 MG tablet Commonly known as:  ANTIVERT Take  0.5 tablets (12.5 mg total) by mouth 3 (three) times daily.   Melatonin 1 MG Tabs Take 1.5 tablets by mouth at bedtime as needed.   montelukast 10 MG tablet Commonly known as:  SINGULAIR TAKE 1 TABLET DAILY   PRESCRIPTION MEDICATION ALLERGY SHOT EVERY 2 WEEKS BY DR. Neldon Mc   RABEprazole 20 MG tablet Commonly known as:  ACIPHEX   ranitidine 300 MG capsule Commonly known as:  ZANTAC TAKE 1 CAPSULE EVERY EVENING   vitamin C 500 MG tablet Commonly known as:  ASCORBIC ACID Take by mouth.       Past Medical History:  Diagnosis Date  . ADHD (attention deficit hyperactivity disorder)   . Allergy    trees/pollen, mold, fungus, dust mites. Takes allergy shots  . Arthritis    PAIN AND OA LEFT HIP  . Asthma    allergist Dr Olena Heckle- monthly allergy injections  . Autonomic dysfunction    CENTRAL NERVOUS SYSTEM NEUROPATHY - DX BY DR. Erling Cruz MORE THAN 10 YRS AGO - AND IT IS FELT TO CONTRIBUTE TO THE AUTOMIC  DYSFUNCTION-- PT HAS NUMBNESS LEGS AND FEET AND SOMETIIMES TIPS OF FINGER, SEVERE CONSTIPATION( NO SENSATION TO HAVE BM ), DOUBLE VISION, ORTHOSTATIC HYPOTENSION  . Blood transfusion   . Breast cancer ([place])    right side  . Bruises easily   . Cataract   . Chest pain    a. 12/2012 Cath: nl cors, EF 55-65%.  . Complication of anesthesia    reaction to some anesthetics/ 7/12 anesth record on chart- states prefers epidural  . Depression   . Diverticulosis   . Dysrhythmia    HX OF HIGH GRADE HEART BLOCK - REQUIRED PACEMAKER INSERTION  . Esophageal stricture   . Gastritis   . GERD (gastroesophageal reflux disease)   . H/O hiatal hernia   . Hemorrhoids   . Hernia of abdominal wall    spigelian hernia RLQ - SURGERY TO REPAIR  . Hypothyroidism   . Interstitial cystitis   . Latex allergy, contact dermatitis   . Mallory - Weiss tear    HEALED   . Neuromuscular disorder (HCC)    central nervous system neuropathy- seen per Dr Erling Cruz  . Pacemaker   . Pericardial effusion    a. 12/2012  following ppm placement;  b. 01/01/2013 Echo: EF 55-60%, small pericardial effusion w/o RV collapse-->No need for tap/window.  Marland Kitchen PONV (postoperative nausea and vomiting)    pt needs scop patch  . Recurrent upper respiratory infection (URI) 1/13- to present   bronchitis following surgery- states improved but still with cough. OV with Clearance Dr [name] 09/06/11 on chart  . Shortness of breath    AT TIMES - BUT MUCH IMPROVED AFTER PACEMAKER WAS REPROGRAMED.  Marland Kitchen Symptomatic bradycardia    a. 12/2012 s/p MDT dual chamber PPM, ser #  [id number]; b. 12/2012 post-op course complicated by pericardial effusinon req lead revision.  . Thyroid disease     Past Surgical History:  Procedure Laterality Date  . ABDOMINAL HYSTERECTOMY  1974  . BACK SURGERY     cervical fusion 4-5 with plate  . BLADDER SUSPENSION    . BREAST BIOPSY  2002   NO BLOOD PRESSURES ON RIGHT SIDE/   s/p  axillary node dissection  . CYSTO WITH HYDRODISTENSION  09/07/2011   Procedure: CYSTOSCOPY/HYDRODISTENSION;  Surgeon: Ailene Rud, MD;  Location: [facility];  Service: Urology;  Laterality: N/A;  INSTILLATION OF MARCAINE/PYRIDIUM INSTILLATION OF MARCAINE/KENALOG  . CYSTOSCOPY  1975, 2007  . EYE SURGERY     LASIK EYE SURGERY BILATERAL  . LAPAROSCOPY  1973  . LEAD REVISION N/A 12/30/2012   Procedure: LEAD REVISION;  Surgeon: Evans Lance, MD;  Location: [facility] CATH LAB;  Service: Cardiovascular;  Laterality: N/A;  . LEFT HEART CATHETERIZATION WITH CORONARY ANGIOGRAM N/A 12/29/2012   Procedure: LEFT HEART CATHETERIZATION WITH CORONARY ANGIOGRAM;  Surgeon: Peter M Martinique, MD;  Location: [facility] CATH LAB;  Service: Cardiovascular;  Laterality: N/A;  . MASTECTOMY MODIFIED RADICAL     right; with immediate reconstruction  . MYRINGOPLASTY  1962  . NECK SURGERY     c4-5 ruptured disc  . OOPHORECTOMY  1982  . PACEMAKER INSERTION    . PERMANENT PACEMAKER INSERTION N/A 12/29/2012   Procedure: PERMANENT PACEMAKER INSERTION;  Surgeon: Deboraha Sprang, MD;  Location: [facility] CATH LAB;  Service: Cardiovascular;  Laterality: N/A;  . RECTOCELE REPAIR     with cystocele repair  . TONSILLECTOMY    . TOTAL HIP ARTHROPLASTY Right   . TOTAL HIP ARTHROPLASTY Left 06/12/2013   Procedure: LEFT TOTAL HIP ARTHROPLASTY ANTERIOR APPROACH;  Surgeon: Mcarthur Rossetti, MD;  Location: [facility];  Service: Orthopedics;  Laterality: Left;  . TYMPANOPLASTY  1973   right  . ULNAR NERVE TRANSPOSITION Right   . VENTRAL HERNIA REPAIR  05/30/2011   Procedure: HERNIA REPAIR VENTRAL ADULT;  Surgeon: Haywood Lasso, MD;  Location: [place];  Service: General;  Laterality: Right;  repair right spigelian hernia    Review of systems negative except as noted in HPI / PMHx or noted below:  Review of Systems  Constitutional: Negative.   HENT: Negative.   Eyes: Negative.   Respiratory: Negative.   Cardiovascular: Negative.   Gastrointestinal: Negative.   Genitourinary: Negative.   Musculoskeletal: Negative.   Skin: Negative.   Neurological: Negative.   Endo/Heme/Allergies: Negative.   Psychiatric/Behavioral: Negative.      Objective:   Vitals:   12/25/17 1109  BP: 104/62  Pulse: 62  Resp: 16  SpO2: 98%   Height: 5\' 7"  (170.2 cm)  Weight: 163 lb (73.9 kg)   Physical Exam  HENT:  Head: Normocephalic.  Right Ear: Tympanic membrane, external ear and ear canal normal.  Left Ear: Tympanic membrane, external ear and ear canal normal.  Nose: Nose normal. No mucosal edema or rhinorrhea.  Mouth/Throat: Uvula is midline, oropharynx is clear and moist and mucous membranes are normal. No oropharyngeal exudate.  Eyes: Conjunctivae are normal.  Neck: Trachea normal. No tracheal tenderness present. No tracheal deviation present. No thyromegaly present.  Cardiovascular: Normal rate, regular rhythm, S1 normal, S2 normal and normal heart sounds.  No murmur heard. Pulmonary/Chest: Breath sounds normal. No stridor. No respiratory distress. She  has no wheezes. She has no rales.  Musculoskeletal: She exhibits no edema.  Lymphadenopathy:  Head (right side): No tonsillar adenopathy present.       Head (left side): No tonsillar adenopathy present.    She has no cervical adenopathy.  Neurological: She is alert.  Skin: No rash noted. She is not diaphoretic. No erythema. Nails show no clubbing.    Diagnostics: none  Assessment and Plan:   1. Other allergic rhinitis   2. LPRD (laryngopharyngeal reflux disease)   3. Esophageal dysmotility     1. Continue Aciphex 20 mg in a.m.  2. Continue ranitidine 300 mg in PM. Can discontinue to see if needed  3. Continue cetirizine/Zyrtec 10 mg 1-2 times a day  4. Continue montelukast 10 mg one time per day  5. Continue OTC nasal fluticasone one spray each nostril one time per day  6. Eliminate all caffeine consumption  7. Obtain fall flu vaccine  8. Return 6 months or earlier if problem  Jinny Blossom appears to be doing very well at this point in time.  She can work through the issue of whether or not she will require ranitidine in addition to her AcipHex to treat her LPR.  She can continue on a nasal steroid and a leukotriene modifier.  I encouraged her to eliminate all caffeine consumption.  I will see her back in this clinic in 6 months or earlier if there is a problem.  Allena Katz, MD Allergy / Immunology [place]

## 2017-12-26 ENCOUNTER — Encounter: Payer: Self-pay | Admitting: Allergy and Immunology

## 2018-01-12 LAB — CUP PACEART REMOTE DEVICE CHECK
Battery Remaining Longevity: 57 mo
Battery Voltage: 2.99 V
Brady Statistic AP VS Percent: 0 %
Brady Statistic AS VP Percent: 76.01 %
Brady Statistic RV Percent Paced: 99.98 %
Date Time Interrogation Session: 20190724201103
Implantable Lead Implant Date: 20140818
Implantable Lead Implant Date: 20140818
Implantable Lead Location: 753859
Implantable Lead Location: 753860
Implantable Lead Model: 5076
Implantable Pulse Generator Implant Date: 20140818
Lead Channel Impedance Value: 703 Ohm
Lead Channel Pacing Threshold Pulse Width: 0.4 ms
Lead Channel Sensing Intrinsic Amplitude: 1.625 mV
Lead Channel Sensing Intrinsic Amplitude: 19 mV
Lead Channel Setting Pacing Amplitude: 2.25 V
Lead Channel Setting Pacing Amplitude: 2.5 V
Lead Channel Setting Pacing Pulse Width: 0.4 ms
Lead Channel Setting Sensing Sensitivity: 0.9 mV
MDC IDC MSMT LEADCHNL RA IMPEDANCE VALUE: 342 Ohm
MDC IDC MSMT LEADCHNL RA IMPEDANCE VALUE: 475 Ohm
MDC IDC MSMT LEADCHNL RA PACING THRESHOLD AMPLITUDE: 1 V
MDC IDC MSMT LEADCHNL RA SENSING INTR AMPL: 1.625 mV
MDC IDC MSMT LEADCHNL RV IMPEDANCE VALUE: 589 Ohm
MDC IDC MSMT LEADCHNL RV PACING THRESHOLD AMPLITUDE: 0.5 V
MDC IDC MSMT LEADCHNL RV PACING THRESHOLD PULSEWIDTH: 0.4 ms
MDC IDC MSMT LEADCHNL RV SENSING INTR AMPL: 19 mV
MDC IDC STAT BRADY AP VP PERCENT: 23.98 %
MDC IDC STAT BRADY AS VS PERCENT: 0.02 %
MDC IDC STAT BRADY RA PERCENT PACED: 23.98 %

## 2018-01-23 DIAGNOSIS — N952 Postmenopausal atrophic vaginitis: Secondary | ICD-10-CM | POA: Diagnosis not present

## 2018-01-23 DIAGNOSIS — Z779 Other contact with and (suspected) exposures hazardous to health: Secondary | ICD-10-CM | POA: Diagnosis not present

## 2018-01-23 DIAGNOSIS — Z9189 Other specified personal risk factors, not elsewhere classified: Secondary | ICD-10-CM | POA: Diagnosis not present

## 2018-01-23 DIAGNOSIS — Z853 Personal history of malignant neoplasm of breast: Secondary | ICD-10-CM | POA: Diagnosis not present

## 2018-01-23 DIAGNOSIS — Z79890 Hormone replacement therapy (postmenopausal): Secondary | ICD-10-CM | POA: Diagnosis not present

## 2018-02-08 DIAGNOSIS — Z23 Encounter for immunization: Secondary | ICD-10-CM | POA: Diagnosis not present

## 2018-03-04 DIAGNOSIS — Z780 Asymptomatic menopausal state: Secondary | ICD-10-CM | POA: Diagnosis not present

## 2018-03-05 ENCOUNTER — Ambulatory Visit (INDEPENDENT_AMBULATORY_CARE_PROVIDER_SITE_OTHER): Payer: Medicare Other | Admitting: *Deleted

## 2018-03-05 DIAGNOSIS — I442 Atrioventricular block, complete: Secondary | ICD-10-CM

## 2018-03-05 NOTE — Progress Notes (Signed)
Remote pacemaker transmission.   

## 2018-03-06 DIAGNOSIS — Z2803 Immunization not carried out because of immune compromised state of patient: Secondary | ICD-10-CM | POA: Diagnosis not present

## 2018-03-06 DIAGNOSIS — H8193 Unspecified disorder of vestibular function, bilateral: Secondary | ICD-10-CM | POA: Diagnosis not present

## 2018-03-06 DIAGNOSIS — G909 Disorder of the autonomic nervous system, unspecified: Secondary | ICD-10-CM | POA: Diagnosis not present

## 2018-03-06 DIAGNOSIS — I480 Paroxysmal atrial fibrillation: Secondary | ICD-10-CM | POA: Diagnosis not present

## 2018-03-06 DIAGNOSIS — M5412 Radiculopathy, cervical region: Secondary | ICD-10-CM | POA: Diagnosis not present

## 2018-03-06 DIAGNOSIS — K449 Diaphragmatic hernia without obstruction or gangrene: Secondary | ICD-10-CM | POA: Diagnosis not present

## 2018-03-06 DIAGNOSIS — M545 Low back pain: Secondary | ICD-10-CM | POA: Diagnosis not present

## 2018-03-06 DIAGNOSIS — E038 Other specified hypothyroidism: Secondary | ICD-10-CM | POA: Diagnosis not present

## 2018-03-06 DIAGNOSIS — M25552 Pain in left hip: Secondary | ICD-10-CM | POA: Diagnosis not present

## 2018-03-06 DIAGNOSIS — M25562 Pain in left knee: Secondary | ICD-10-CM | POA: Diagnosis not present

## 2018-03-06 DIAGNOSIS — K219 Gastro-esophageal reflux disease without esophagitis: Secondary | ICD-10-CM | POA: Diagnosis not present

## 2018-03-06 DIAGNOSIS — Z950 Presence of cardiac pacemaker: Secondary | ICD-10-CM | POA: Diagnosis not present

## 2018-03-11 ENCOUNTER — Ambulatory Visit (INDEPENDENT_AMBULATORY_CARE_PROVIDER_SITE_OTHER): Payer: Medicare Other | Admitting: Internal Medicine

## 2018-03-11 ENCOUNTER — Encounter: Payer: Self-pay | Admitting: Internal Medicine

## 2018-03-11 VITALS — BP 112/68 | HR 60 | Ht 67.0 in | Wt 160.4 lb

## 2018-03-11 DIAGNOSIS — I442 Atrioventricular block, complete: Secondary | ICD-10-CM

## 2018-03-11 DIAGNOSIS — I951 Orthostatic hypotension: Secondary | ICD-10-CM | POA: Diagnosis not present

## 2018-03-11 DIAGNOSIS — Z950 Presence of cardiac pacemaker: Secondary | ICD-10-CM

## 2018-03-11 NOTE — Patient Instructions (Signed)
Your physician recommends that you continue on your current medications as directed. Please refer to the Current Medication list given to you today.   Your physician wants you to follow-up in: YEAR WITH DR KLEIN  You will receive a reminder letter in the mail two months in advance. If you don't receive a letter, please call our office to schedule the follow-up appointment.  

## 2018-03-11 NOTE — Progress Notes (Signed)
A and a and     Patient Care Team: Burnard Bunting, MD as PCP - General (Internal Medicine) Chucky May, MD as Consulting Physician (Psychiatry)   HPI  [name] is a 72 y.o. female Seen in followup for high grade heart block for which she underwent pacing compllicated by microperforation modest effusion and then adderall withdrawal   She is far  better following reprogramming of the device after we noted upper rate behavior limiting heart rate excursion she still has complaints sometimes and exercise intolerance on stairs  She reminded that she has a diagnosis of central polyneuropathy;    She has been followed at [facility] with Marilynne Drivers. She was last seen 6/19 Salt and proamatine were again recommended with up titration of her dose to 4 times a day.  At this point they have also started to recommend compressive wear.  She is exercising more.  She is able to walk with her grandson 3 miles in about an hour.  She has problems with dizziness periodically.  Typically one day a week is much worse than the others.  No clear association with previous day's activities or exuberant alcohol intake.    Echo EF 5/16 normal       Past Medical History:  Diagnosis Date  . ADHD (attention deficit hyperactivity disorder)   . Allergy    trees/pollen, mold, fungus, dust mites. Takes allergy shots  . Arthritis    PAIN AND OA LEFT HIP  . Asthma    allergist Dr Olena Heckle- monthly allergy injections  . Autonomic dysfunction    CENTRAL NERVOUS SYSTEM NEUROPATHY - DX BY DR. Erling Cruz MORE THAN 10 YRS AGO - AND IT IS FELT TO CONTRIBUTE TO THE AUTOMIC  DYSFUNCTION-- PT HAS NUMBNESS LEGS AND FEET AND SOMETIIMES TIPS OF FINGER, SEVERE CONSTIPATION( NO SENSATION TO HAVE BM ), DOUBLE VISION, ORTHOSTATIC HYPOTENSION  . Blood transfusion   . Breast cancer ([place])    right side  . Bruises easily   . Cataract   . Chest pain    a. 12/2012 Cath: nl cors, EF 55-65%.  . Complication of anesthesia    reaction to some anesthetics/ 7/12 anesth record on chart- states prefers epidural  . Depression   . Diverticulosis   . Dysrhythmia    HX OF HIGH GRADE HEART BLOCK - REQUIRED PACEMAKER INSERTION  . Esophageal stricture   . Gastritis   . GERD (gastroesophageal reflux disease)   . H/O hiatal hernia   . Hemorrhoids   . Hernia of abdominal wall    spigelian hernia RLQ - SURGERY TO REPAIR  . Hypothyroidism   . Interstitial cystitis   . Latex allergy, contact dermatitis   . Mallory - Weiss tear    HEALED   . Neuromuscular disorder (HCC)    central nervous system neuropathy- seen per Dr Erling Cruz  . Pacemaker   . Pericardial effusion    a. 12/2012 following ppm placement;  b. 01/01/2013 Echo: EF 55-60%, small pericardial effusion w/o RV collapse-->No need for tap/window.  Marland Kitchen PONV (postoperative nausea and vomiting)    pt needs scop patch  . Recurrent upper respiratory infection (URI) 1/13- to present   bronchitis following surgery- states improved but still with cough. OV with Clearance Dr [name] 09/06/11 on chart  . Shortness of breath    AT TIMES - BUT MUCH IMPROVED AFTER PACEMAKER WAS REPROGRAMED.  Marland Kitchen Symptomatic bradycardia    a. 12/2012 s/p MDT dual chamber PPM, ser # [id number]; b. 12/2012 post-op  course complicated by pericardial effusinon req lead revision.  . Thyroid disease     Past Surgical History:  Procedure Laterality Date  . ABDOMINAL HYSTERECTOMY  1974  . BACK SURGERY     cervical fusion 4-5 with plate  . BLADDER SUSPENSION    . BREAST BIOPSY  2002   NO BLOOD PRESSURES ON RIGHT SIDE/   s/p  axillary node dissection  . CYSTO WITH HYDRODISTENSION  09/07/2011   Procedure: CYSTOSCOPY/HYDRODISTENSION;  Surgeon: Ailene Rud, MD;  Location: [facility];  Service: Urology;  Laterality: N/A;  INSTILLATION OF MARCAINE/PYRIDIUM INSTILLATION OF MARCAINE/KENALOG  . CYSTOSCOPY  1975, 2007  . EYE SURGERY     LASIK EYE SURGERY BILATERAL  . LAPAROSCOPY  1973  . LEAD REVISION N/A  12/30/2012   Procedure: LEAD REVISION;  Surgeon: Evans Lance, MD;  Location: [facility] CATH LAB;  Service: Cardiovascular;  Laterality: N/A;  . LEFT HEART CATHETERIZATION WITH CORONARY ANGIOGRAM N/A 12/29/2012   Procedure: LEFT HEART CATHETERIZATION WITH CORONARY ANGIOGRAM;  Surgeon: Peter M Martinique, MD;  Location: [facility] CATH LAB;  Service: Cardiovascular;  Laterality: N/A;  . MASTECTOMY MODIFIED RADICAL     right; with immediate reconstruction  . MYRINGOPLASTY  1962  . NECK SURGERY     c4-5 ruptured disc  . OOPHORECTOMY  1982  . PACEMAKER INSERTION    . PERMANENT PACEMAKER INSERTION N/A 12/29/2012   Procedure: PERMANENT PACEMAKER INSERTION;  Surgeon: Deboraha Sprang, MD;  Location: [facility] CATH LAB;  Service: Cardiovascular;  Laterality: N/A;  . RECTOCELE REPAIR     with cystocele repair  . TONSILLECTOMY    . TOTAL HIP ARTHROPLASTY Right   . TOTAL HIP ARTHROPLASTY Left 06/12/2013   Procedure: LEFT TOTAL HIP ARTHROPLASTY ANTERIOR APPROACH;  Surgeon: Mcarthur Rossetti, MD;  Location: [facility];  Service: Orthopedics;  Laterality: Left;  . TYMPANOPLASTY  1973   right  . ULNAR NERVE TRANSPOSITION Right   . VENTRAL HERNIA REPAIR  05/30/2011   Procedure: HERNIA REPAIR VENTRAL ADULT;  Surgeon: Haywood Lasso, MD;  Location: [place];  Service: General;  Laterality: Right;  repair right spigelian hernia    Current Outpatient Medications  Medication Sig Dispense Refill  . acetaminophen (TYLENOL) 325 MG tablet Take 2 tablets (650 mg total) by mouth every 6 (six) hours as needed for mild pain. 30 tablet 0  . albuterol (PROVENTIL HFA;VENTOLIN HFA) 108 (90 BASE) MCG/ACT inhaler Inhale 2 puffs into the lungs every 6 (six) hours as needed. For shortness of breath    . b complex vitamins capsule Take 1 capsule by mouth daily.     . cephALEXin (KEFLEX) 500 MG capsule TAKE 4 CAPSULES BY MOUTH 1 HOUR PRIOR TO APPOINTMENT  0  . cetirizine (ZYRTEC) 10 MG tablet Take by mouth.    . cholecalciferol  (VITAMIN D) 1000 UNITS tablet Take 1,000 Units by mouth daily.    . cycloSPORINE (RESTASIS) 0.05 % ophthalmic emulsion Apply to eye.    Marland Kitchen EPINEPHrine (EPIPEN 2-PAK) 0.3 mg/0.3 mL IJ SOAJ injection Inject 0.3 mLs (0.3 mg total) into the muscle once. 2 Device 3  . escitalopram (LEXAPRO) 10 MG tablet Take 10 mg by mouth daily.    Marland Kitchen estradiol (ESTRING) 2 MG vaginal ring Place 2 mg vaginally every 3 (three) months. follow package directions    . estradiol (VIVELLE-DOT) 0.025 MG/24HR Place 1 patch onto the skin 2 (two) times a week. Applies new patch on Sunday & Thursday    . fluocinonide (  LIDEX) 0.05 % external solution 1 APPLICATION ON THE SKIN AS DIRECTED. APPLY TO SCALP BID FOR 1-2 WEEKS PRN FLARE AS DIRECTED  1  . fluticasone (FLONASE) 50 MCG/ACT nasal spray Place 1 spray into both nostrils daily.    Marland Kitchen KLOR-CON M10 10 MEQ tablet TAKE 1 TABLET DAILY 90 tablet 3  . levothyroxine (SYNTHROID, LEVOTHROID) 100 MCG tablet Take 1 tablet by mouth daily.    . Magnesium 500 MG CAPS Take 1 capsule by mouth daily.    . meclizine (ANTIVERT) 25 MG tablet Take 0.5 tablets (12.5 mg total) by mouth 3 (three) times daily. 45 tablet 1  . Melatonin 1 MG TABS Take 1.5 tablets by mouth at bedtime as needed.     . midodrine (PROAMATINE) 5 MG tablet Take 1 tablet by mouth every 3 (three) hours.    . montelukast (SINGULAIR) 10 MG tablet TAKE 1 TABLET DAILY 90 tablet 1  . Probiotic Product (ALIGN) 4 MG CAPS Take 4 mg by mouth at bedtime.     . RABEprazole (ACIPHEX) 20 MG tablet   11  . ranitidine (ZANTAC) 300 MG capsule TAKE 1 CAPSULE EVERY EVENING 90 capsule 1  . vitamin C (ASCORBIC ACID) 500 MG tablet Take by mouth.     No current facility-administered medications for this visit.    Facility-Administered Medications Ordered in Other Visits  Medication Dose Route Frequency Provider Last Rate Last Dose  . bupivacaine (MARCAINE) 0.5 % 10 mL, triamcinolone acetonide (KENALOG-40) 40 mg injection   Subcutaneous Once  Carolan Clines, MD      . bupivacaine (MARCAINE) 0.5 % 15 mL, phenazopyridine (PYRIDIUM) 400 mg bladder mixture   Bladder Instillation Once Carolan Clines, MD        Allergies  Allergen Reactions  . Anesthetics, Amide Other (See Comments)    Patient unsure of names, however multiples cause swelling of airway & nausea  . Clindamycin Swelling  . Neomycin Swelling  . Shellfish Allergy Other (See Comments)    Neurotoxic reaction  . Sulfonamide Derivatives Anaphylaxis and Swelling  . Zolpidem Tartrate Other (See Comments)    Jerking motions   . Azithromycin Other (See Comments)    Severe gastritis   . Bactroban Other (See Comments)    Causes sores in nose  . Ciprofloxacin     REACTION: joint swelling  . Codeine Swelling  . Meperidine Hcl Nausea Only    Hallucinations   . Morphine Nausea Only    Hallucinations   . Neosporin [Neomycin-Polymyxin-Gramicidin] Hives and Dermatitis    All topical "orin's ointment"  . Nitrofurantoin     REACTION: neuropathy in legs  . Other     ALLERGIC TO WARM WATER SHELLFISH  PT STATES PLEASE NOTE SHE CAN TAKE OXYCODONE AND TYLENOL FOR PAIN -NOT ALLERGIC  . Penicillins Other (See Comments)    Swelling in joints  . Tramadol     Makes jerk  . Latex Rash    Review of Systems negative except from HPI and PMH  Physical Exam BP 112/68   Pulse 60   Ht 5\' 7"  (1.702 m)   Wt 160 lb 6.4 oz (72.8 kg)   SpO2 96%   BMI 25.12 kg/m  Well developed and nourished in no acute distress HENT normal Neck supple with JVP-flat Clear Regular rate and rhythm, no murmurs or gallops Abd-soft with active BS No Clubbing cyanosis edema Skin-warm and dry A & Oriented  Grossly normal sensory and motor function    ECG P-synchronous/ AV  pacing  Assessment and  Plan  Complete heart block  Pacemaker-[employer] The patient's device was interrogated.  The information was reviewed. No changes were made in the programming.     Exercise associated  hypotension  ? Dysautonomia  Central polyneuropathy  Not sure why it is that she is having some dizzy days.  We have reviewed the possibility of irritable   alcohol intake, variable exercise.  She will try to be cognizant of what might be contributing to this. We also discussed the use of compressive wear on her bad days.  She is using a abdominal binder that she got from mastectomy store.  We talked about thigh sleeves.  We spent more than 50% of our >25 min visit in face to face counseling regarding the above

## 2018-03-19 LAB — CUP PACEART INCLINIC DEVICE CHECK
Date Time Interrogation Session: 20191106152830
Implantable Lead Implant Date: 20140818
Implantable Lead Location: 753860
Implantable Lead Model: 5076
Implantable Pulse Generator Implant Date: 20140818
MDC IDC LEAD IMPLANT DT: 20140818
MDC IDC LEAD LOCATION: 753859

## 2018-03-21 ENCOUNTER — Encounter (HOSPITAL_COMMUNITY): Payer: Self-pay | Admitting: Emergency Medicine

## 2018-03-21 ENCOUNTER — Emergency Department (HOSPITAL_COMMUNITY): Payer: Medicare Other

## 2018-03-21 ENCOUNTER — Other Ambulatory Visit: Payer: Self-pay

## 2018-03-21 ENCOUNTER — Inpatient Hospital Stay (HOSPITAL_COMMUNITY)
Admission: EM | Admit: 2018-03-21 | Discharge: 2018-03-24 | DRG: 063 | Disposition: A | Discharge status: Home / Self Care | Payer: Medicare Other | Attending: Neurology | Admitting: Neurology

## 2018-03-21 DIAGNOSIS — E039 Hypothyroidism, unspecified: Secondary | ICD-10-CM | POA: Diagnosis present

## 2018-03-21 DIAGNOSIS — I10 Essential (primary) hypertension: Secondary | ICD-10-CM | POA: Diagnosis present

## 2018-03-21 DIAGNOSIS — G43109 Migraine with aura, not intractable, without status migrainosus: Secondary | ICD-10-CM | POA: Diagnosis present

## 2018-03-21 DIAGNOSIS — I5030 Unspecified diastolic (congestive) heart failure: Secondary | ICD-10-CM | POA: Diagnosis not present

## 2018-03-21 DIAGNOSIS — Z885 Allergy status to narcotic agent status: Secondary | ICD-10-CM | POA: Diagnosis not present

## 2018-03-21 DIAGNOSIS — R29705 NIHSS score 5: Secondary | ICD-10-CM | POA: Diagnosis present

## 2018-03-21 DIAGNOSIS — Z91013 Allergy to seafood: Secondary | ICD-10-CM

## 2018-03-21 DIAGNOSIS — R29810 Facial weakness: Secondary | ICD-10-CM | POA: Diagnosis present

## 2018-03-21 DIAGNOSIS — Z9011 Acquired absence of right breast and nipple: Secondary | ICD-10-CM

## 2018-03-21 DIAGNOSIS — K219 Gastro-esophageal reflux disease without esophagitis: Secondary | ICD-10-CM | POA: Diagnosis present

## 2018-03-21 DIAGNOSIS — Z880 Allergy status to penicillin: Secondary | ICD-10-CM | POA: Diagnosis not present

## 2018-03-21 DIAGNOSIS — E876 Hypokalemia: Secondary | ICD-10-CM | POA: Diagnosis present

## 2018-03-21 DIAGNOSIS — Z884 Allergy status to anesthetic agent status: Secondary | ICD-10-CM

## 2018-03-21 DIAGNOSIS — E785 Hyperlipidemia, unspecified: Secondary | ICD-10-CM | POA: Diagnosis present

## 2018-03-21 DIAGNOSIS — Z950 Presence of cardiac pacemaker: Secondary | ICD-10-CM | POA: Diagnosis not present

## 2018-03-21 DIAGNOSIS — Z881 Allergy status to other antibiotic agents status: Secondary | ICD-10-CM | POA: Diagnosis not present

## 2018-03-21 DIAGNOSIS — Z91040 Latex allergy status: Secondary | ICD-10-CM

## 2018-03-21 DIAGNOSIS — I639 Cerebral infarction, unspecified: Secondary | ICD-10-CM

## 2018-03-21 DIAGNOSIS — Z96643 Presence of artificial hip joint, bilateral: Secondary | ICD-10-CM | POA: Diagnosis present

## 2018-03-21 DIAGNOSIS — Z79890 Hormone replacement therapy (postmenopausal): Secondary | ICD-10-CM

## 2018-03-21 DIAGNOSIS — Z7951 Long term (current) use of inhaled steroids: Secondary | ICD-10-CM

## 2018-03-21 DIAGNOSIS — Z882 Allergy status to sulfonamides status: Secondary | ICD-10-CM

## 2018-03-21 DIAGNOSIS — Z853 Personal history of malignant neoplasm of breast: Secondary | ICD-10-CM

## 2018-03-21 DIAGNOSIS — R270 Ataxia, unspecified: Secondary | ICD-10-CM | POA: Diagnosis present

## 2018-03-21 DIAGNOSIS — Z79899 Other long term (current) drug therapy: Secondary | ICD-10-CM | POA: Diagnosis not present

## 2018-03-21 DIAGNOSIS — G629 Polyneuropathy, unspecified: Secondary | ICD-10-CM | POA: Diagnosis present

## 2018-03-21 DIAGNOSIS — R42 Dizziness and giddiness: Secondary | ICD-10-CM | POA: Diagnosis not present

## 2018-03-21 DIAGNOSIS — I6300 Cerebral infarction due to thrombosis of unspecified precerebral artery: Secondary | ICD-10-CM | POA: Diagnosis not present

## 2018-03-21 DIAGNOSIS — R29818 Other symptoms and signs involving the nervous system: Secondary | ICD-10-CM | POA: Diagnosis not present

## 2018-03-21 HISTORY — DX: Cerebral infarction, unspecified: I63.9

## 2018-03-21 LAB — I-STAT CHEM 8, ED
BUN: 11 mg/dL (ref 8–23)
CHLORIDE: 105 mmol/L (ref 98–111)
CREATININE: 0.7 mg/dL (ref 0.44–1.00)
Calcium, Ion: 1.08 mmol/L — ABNORMAL LOW (ref 1.15–1.40)
GLUCOSE: 115 mg/dL — AB (ref 70–99)
HEMATOCRIT: 43 % (ref 36.0–46.0)
HEMOGLOBIN: 14.6 g/dL (ref 12.0–15.0)
POTASSIUM: 3.4 mmol/L — AB (ref 3.5–5.1)
Sodium: 139 mmol/L (ref 135–145)
TCO2: 21 mmol/L — ABNORMAL LOW (ref 22–32)

## 2018-03-21 LAB — CBC
HCT: 44.5 % (ref 36.0–46.0)
HEMOGLOBIN: 14.4 g/dL (ref 12.0–15.0)
MCH: 30.7 pg (ref 26.0–34.0)
MCHC: 32.4 g/dL (ref 30.0–36.0)
MCV: 94.9 fL (ref 80.0–100.0)
NRBC: 0 % (ref 0.0–0.2)
PLATELETS: 285 10*3/uL (ref 150–400)
RBC: 4.69 MIL/uL (ref 3.87–5.11)
RDW: 13 % (ref 11.5–15.5)
WBC: 7.6 10*3/uL (ref 4.0–10.5)

## 2018-03-21 LAB — I-STAT TROPONIN, ED: Troponin i, poc: 0.01 ng/mL (ref 0.00–0.08)

## 2018-03-21 LAB — RAPID URINE DRUG SCREEN, HOSP PERFORMED
AMPHETAMINES: NOT DETECTED
BARBITURATES: NOT DETECTED
Benzodiazepines: NOT DETECTED
COCAINE: NOT DETECTED
Opiates: NOT DETECTED
TETRAHYDROCANNABINOL: NOT DETECTED

## 2018-03-21 LAB — COMPREHENSIVE METABOLIC PANEL
ALBUMIN: 4.3 g/dL (ref 3.5–5.0)
ALT: 23 U/L (ref 0–44)
AST: 30 U/L (ref 15–41)
Alkaline Phosphatase: 44 U/L (ref 38–126)
Anion gap: 12 (ref 5–15)
BUN: 12 mg/dL (ref 8–23)
CHLORIDE: 105 mmol/L (ref 98–111)
CO2: 20 mmol/L — ABNORMAL LOW (ref 22–32)
CREATININE: 0.8 mg/dL (ref 0.44–1.00)
Calcium: 9.3 mg/dL (ref 8.9–10.3)
GFR calc Af Amer: >60 mL/min (ref >60)
GFR calc non Af Amer: >60 mL/min (ref >60)
GLUCOSE: 116 mg/dL — AB (ref 70–99)
POTASSIUM: 3.6 mmol/L (ref 3.5–5.1)
Sodium: 137 mmol/L (ref 135–145)
Total Bilirubin: 0.6 mg/dL (ref 0.3–1.2)
Total Protein: 7.2 g/dL (ref 6.5–8.1)

## 2018-03-21 LAB — URINALYSIS, ROUTINE W REFLEX MICROSCOPIC
BILIRUBIN URINE: NEGATIVE
GLUCOSE, UA: NEGATIVE mg/dL
HGB URINE DIPSTICK: NEGATIVE
KETONES UR: 5 mg/dL — AB
Leukocytes, UA: NEGATIVE
Nitrite: NEGATIVE
PROTEIN: NEGATIVE mg/dL
Specific Gravity, Urine: 1.03 (ref 1.005–1.030)
pH: 8 (ref 5.0–8.0)

## 2018-03-21 LAB — DIFFERENTIAL
ABS IMMATURE GRANULOCYTES: 0.01 10*3/uL (ref 0.00–0.07)
BASOS PCT: 0 %
Basophils Absolute: 0 10*3/uL (ref 0.0–0.1)
EOS ABS: 0.1 10*3/uL (ref 0.0–0.5)
Eosinophils Relative: 2 %
IMMATURE GRANULOCYTES: 0 %
Lymphocytes Relative: 43 %
Lymphs Abs: 3.3 10*3/uL (ref 0.7–4.0)
MONOS PCT: 11 %
Monocytes Absolute: 0.8 10*3/uL (ref 0.1–1.0)
NEUTROS PCT: 44 %
Neutro Abs: 3.3 10*3/uL (ref 1.7–7.7)

## 2018-03-21 LAB — MRSA PCR SCREENING: MRSA by PCR: NEGATIVE

## 2018-03-21 LAB — ETHANOL: Alcohol, Ethyl (B): <10 mg/dL (ref <10)

## 2018-03-21 MED ORDER — SODIUM CHLORIDE 0.9 % IV SOLN
50.0000 mL | Freq: Once | INTRAVENOUS | Status: AC
  Administered 2018-03-21: 50 mL via INTRAVENOUS

## 2018-03-21 MED ORDER — MECLIZINE HCL 12.5 MG PO TABS
12.5000 mg | ORAL_TABLET | Freq: Three times a day (TID) | ORAL | Status: DC
  Filled 2018-03-21 (×9): qty 1

## 2018-03-21 MED ORDER — ACETAMINOPHEN 325 MG PO TABS: 650.0000 mg | ORAL_TABLET | ORAL | Status: DC | PRN

## 2018-03-21 MED ORDER — CLEVIDIPINE BUTYRATE 0.5 MG/ML IV EMUL: 0.0000 mg/h | INTRAVENOUS | Status: DC

## 2018-03-21 MED ORDER — SODIUM CHLORIDE 0.9 % IV BOLUS
500.0000 mL | Freq: Once | INTRAVENOUS | Status: AC
  Administered 2018-03-22: 500 mL via INTRAVENOUS

## 2018-03-21 MED ORDER — ALBUTEROL SULFATE (2.5 MG/3ML) 0.083% IN NEBU: 2.5000 mg | INHALATION_SOLUTION | Freq: Four times a day (QID) | RESPIRATORY_TRACT | Status: DC | PRN

## 2018-03-21 MED ORDER — SODIUM CHLORIDE 0.9 % IV SOLN
INTRAVENOUS | Status: DC
  Administered 2018-03-21 – 2018-03-24 (×7): via INTRAVENOUS

## 2018-03-21 MED ORDER — LEVOTHYROXINE SODIUM 100 MCG PO TABS
100.0000 ug | ORAL_TABLET | Freq: Every day | ORAL | Status: DC
  Administered 2018-03-22 – 2018-03-24 (×3): 100 ug via ORAL
  Filled 2018-03-21 (×3): qty 1

## 2018-03-21 MED ORDER — STROKE: EARLY STAGES OF RECOVERY BOOK
Freq: Once | Status: DC
  Filled 2018-03-21 (×3): qty 1

## 2018-03-21 MED ORDER — ACETAMINOPHEN 160 MG/5ML PO SOLN: 650.0000 mg | ORAL | Status: DC | PRN

## 2018-03-21 MED ORDER — ESCITALOPRAM OXALATE 10 MG PO TABS
10.0000 mg | ORAL_TABLET | Freq: Every day | ORAL | Status: DC
  Administered 2018-03-22 – 2018-03-24 (×3): 10 mg via ORAL
  Filled 2018-03-21 (×3): qty 1

## 2018-03-21 MED ORDER — SODIUM CHLORIDE 0.9 % IV SOLN: 100.0000 mL/h | INTRAVENOUS | Status: DC

## 2018-03-21 MED ORDER — ALBUTEROL SULFATE HFA 108 (90 BASE) MCG/ACT IN AERS
2.0000 | INHALATION_SPRAY | Freq: Four times a day (QID) | RESPIRATORY_TRACT | Status: DC | PRN
  Filled 2018-03-21: qty 6.7

## 2018-03-21 MED ORDER — ACETAMINOPHEN 650 MG RE SUPP: 650.0000 mg | RECTAL | Status: DC | PRN

## 2018-03-21 MED ORDER — LABETALOL HCL 5 MG/ML IV SOLN
20.0000 mg | Freq: Once | INTRAVENOUS | Status: DC
  Filled 2018-03-21: qty 4

## 2018-03-21 MED ORDER — IOPAMIDOL (ISOVUE-370) INJECTION 76%
50.0000 mL | Freq: Once | INTRAVENOUS | Status: AC | PRN
  Administered 2018-03-21: 50 mL via INTRAVENOUS

## 2018-03-21 MED ORDER — ALTEPLASE (STROKE) FULL DOSE INFUSION
0.9000 mg/kg | Freq: Once | INTRAVENOUS | Status: AC
  Administered 2018-03-21: 66.6 mg via INTRAVENOUS
  Filled 2018-03-21: qty 100

## 2018-03-21 MED ORDER — PANTOPRAZOLE SODIUM 40 MG IV SOLR
40.0000 mg | Freq: Every day | INTRAVENOUS | Status: DC
  Administered 2018-03-21 – 2018-03-23 (×3): 40 mg via INTRAVENOUS
  Filled 2018-03-21 (×3): qty 40

## 2018-03-21 MED ORDER — SENNOSIDES-DOCUSATE SODIUM 8.6-50 MG PO TABS: 1.0000 | ORAL_TABLET | Freq: Every evening | ORAL | Status: DC | PRN

## 2018-03-21 MED ORDER — MONTELUKAST SODIUM 10 MG PO TABS
10.0000 mg | ORAL_TABLET | Freq: Every day | ORAL | Status: DC
  Administered 2018-03-21 – 2018-03-24 (×4): 10 mg via ORAL
  Filled 2018-03-21 (×5): qty 1

## 2018-03-21 MED ORDER — MIDODRINE HCL 5 MG PO TABS
5.0000 mg | ORAL_TABLET | ORAL | Status: DC
  Administered 2018-03-21 – 2018-03-22 (×6): 5 mg via ORAL
  Filled 2018-03-21 (×6): qty 1

## 2018-03-21 MED ORDER — ONDANSETRON HCL 4 MG/2ML IJ SOLN
4.0000 mg | Freq: Once | INTRAMUSCULAR | Status: AC
  Administered 2018-03-21: 4 mg via INTRAVENOUS
  Filled 2018-03-21: qty 2

## 2018-03-21 NOTE — ED Triage Notes (Signed)
Pt to ED via private vehicle with c/o dizziness that started this am, triad to walk at home and states "everything was moving from left to right left to right" hxof orthostatic hypotension, pt is vomiting, unablew to focus.

## 2018-03-21 NOTE — Code Documentation (Signed)
72 yo Caucasian female coming from home with complaints of sudden onset of dizziness. Pt was at home and called her daughter at 27. After she got off the phone, she noted some dizziness that increasingly got worse. Pt was brought in to the ED by her husband. EDP activated a Code Stroke after patient was evaluated. Stroke team met patient in CT. CT negative for hemorrhage. Initial NIHSS 5 due to bilateral leg weakness, right facial droop, and ataxia noted in right upper and lower extremity. BP noted to be 162/73. tPA started at 1431. Pt remains on the cardiac monitor. To be admitted to [facility]. Santiago Glad, RN giving report to [place].

## 2018-03-21 NOTE — Progress Notes (Signed)
OT Cancellation Note  Patient Details Name: [name] MRN: [id number] DOB: 05-16-1945   Cancelled Treatment:    Reason Eval/Treat Not Completed: Active bedrest order;Patient not medically ready  [place], OT/L   Acute OT Clinical Specialist Acute Rehabilitation Services Pager [phone] Office [phone]  03/21/2018, 4:22 PM

## 2018-03-21 NOTE — Progress Notes (Signed)
Pharmacist Code Stroke Response  Notified to mix tPA at 1425 by Dr. Rory Percy Delivered tPA to RN at 1429  tPA dose = 6.7mg  bolus over 1 minute followed by 59.9mg  for a total dose of 66.6mg  over 1 hour  Issues/delays encountered (if applicable): N/A  [name], Rande Lawman 03/21/18 2:30 PM

## 2018-03-21 NOTE — ED Provider Notes (Signed)
[facility] EMERGENCY DEPARTMENT Provider Note   CSN: [id number] Arrival date & time: 03/21/18  1341     History   Chief Complaint Chief Complaint  Patient presents with  . Headache  . Emesis  . Dizziness    HPI [name] is a 72 y.o. female.  HPI Patient presents to the emergency room for evaluation of acute onset dizziness.  Patient states she was at home this evening when she had sudden onset of dizziness with the room spinning at about noon.  The symptoms rapidly increased in severity over the next half hour or so.  Patient noticed that things are moving rapidly from left to right.  She does get worse when she moves her head however it does not resolve when she is still.  Patient then began having multiple episodes of nausea and vomiting.  She denies having similar symptoms in the past.  She does have double vision but that is not a new issue for her.  She also is complaining of headache.  She denies any trouble with speech difficulties.  No focal numbness or weakness. Past Medical History:  Diagnosis Date  . ADHD (attention deficit hyperactivity disorder)   . Allergy    trees/pollen, mold, fungus, dust mites. Takes allergy shots  . Arthritis    PAIN AND OA LEFT HIP  . Asthma    allergist Dr Olena Heckle- monthly allergy injections  . Autonomic dysfunction    CENTRAL NERVOUS SYSTEM NEUROPATHY - DX BY DR. Erling Cruz MORE THAN 10 YRS AGO - AND IT IS FELT TO CONTRIBUTE TO THE AUTOMIC  DYSFUNCTION-- PT HAS NUMBNESS LEGS AND FEET AND SOMETIIMES TIPS OF FINGER, SEVERE CONSTIPATION( NO SENSATION TO HAVE BM ), DOUBLE VISION, ORTHOSTATIC HYPOTENSION  . Blood transfusion   . Breast cancer ([place])    right side  . Bruises easily   . Cataract   . Chest pain    a. 12/2012 Cath: nl cors, EF 55-65%.  . Complication of anesthesia    reaction to some anesthetics/ 7/12 anesth record on chart- states prefers epidural  . Depression   . Diverticulosis   . Dysrhythmia    HX  OF HIGH GRADE HEART BLOCK - REQUIRED PACEMAKER INSERTION  . Esophageal stricture   . Gastritis   . GERD (gastroesophageal reflux disease)   . H/O hiatal hernia   . Hemorrhoids   . Hernia of abdominal wall    spigelian hernia RLQ - SURGERY TO REPAIR  . Hypothyroidism   . Interstitial cystitis   . Latex allergy, contact dermatitis   . Mallory - Weiss tear    HEALED   . Neuromuscular disorder (HCC)    central nervous system neuropathy- seen per Dr Erling Cruz  . Pacemaker   . Pericardial effusion    a. 12/2012 following ppm placement;  b. 01/01/2013 Echo: EF 55-60%, small pericardial effusion w/o RV collapse-->No need for tap/window.  Marland Kitchen PONV (postoperative nausea and vomiting)    pt needs scop patch  . Recurrent upper respiratory infection (URI) 1/13- to present   bronchitis following surgery- states improved but still with cough. OV with [facility] Dr [name] 09/06/11 on chart  . Shortness of breath    AT TIMES - BUT MUCH IMPROVED AFTER PACEMAKER WAS REPROGRAMED.  Marland Kitchen Symptomatic bradycardia    a. 12/2012 s/p MDT dual chamber PPM, ser # [id number]; b. 12/2012 post-op course complicated by pericardial effusinon req lead revision.  . Thyroid disease     Patient Active Problem List  Diagnosis Date Noted  . Chronic right shoulder pain 02/13/2017  . Short Achilles tendon (acquired), left ankle 02/13/2017  . Dizziness and giddiness 02/16/2016  . Monoallelic mutation of CHEK2 gene 02/02/2015  . Genetic testing 01/31/2015  . Altered mental status 10/25/2014  . Occipital neuralgia of left side   . Stroke ([place]) 10/07/2014  . History of permanent cardiac pacemaker placement 10/07/2014  . Cephalalgia   . Complicated migraine   . HLD (hyperlipidemia)   . Headache 10/06/2014  . Arthritis of left hip 06/12/2013  . Status post THR (total hip replacement) 06/12/2013  . Constipation 05/25/2013  . Orthostatic hypotension 01/27/2013  . crosstalk-atrial lead 01/27/2013  . Pre-syncope 01/02/2013  . Sinus  tachycardia 01/01/2013  . Second degree heart block 12/27/2012  . Hypothyroidism 12/27/2012  . Spigelian hernia 04/20/2011  . DYSPNEA 05/09/2010  . BREAST CANCER 05/05/2010  . ASTHMA 05/05/2010  . HIATAL HERNIA 05/05/2010  . DEGENERATIVE Disk DISEASE 05/05/2010  . OSTEOPENIA 05/05/2010    Past Surgical History:  Procedure Laterality Date  . ABDOMINAL HYSTERECTOMY  1974  . BACK SURGERY     cervical fusion 4-5 with plate  . BLADDER SUSPENSION    . BREAST BIOPSY  2002   NO BLOOD PRESSURES ON RIGHT SIDE/   s/p  axillary node dissection  . CYSTO WITH HYDRODISTENSION  09/07/2011   Procedure: CYSTOSCOPY/HYDRODISTENSION;  Surgeon: Ailene Rud, MD;  Location: [facility];  Service: Urology;  Laterality: N/A;  INSTILLATION OF MARCAINE/PYRIDIUM INSTILLATION OF MARCAINE/KENALOG  . CYSTOSCOPY  1975, 2007  . EYE SURGERY     LASIK EYE SURGERY BILATERAL  . LAPAROSCOPY  1973  . LEAD REVISION N/A 12/30/2012   Procedure: LEAD REVISION;  Surgeon: Evans Lance, MD;  [facility] C[facility] LAB;  Service: Cardiovascular;  Laterality: N/A;  . LEFT HEART CATHETERIZATION WITH CORONARY ANGIOGRAM N/A 12/29/2012   Procedure: LEFT HEART CATHETERIZATION WITH CORONARY ANGIOGRAM;  Surgeon: Peter M Martinique, MD;  Location: [facility] CATH LAB;  Service: Cardiovascular;  Laterality: N/A;  . MASTECTOMY MODIFIED RADICAL     right; with immediate reconstruction  . MYRINGOPLASTY  1962  . NECK SURGERY     c4-5 ruptured disc  . OOPHORECTOMY  1982  . PACEMAKER INSERTION    . PERMANENT PACEMAKER INSERTION N/A 12/29/2012   Procedure: PERMANENT PACEMAKER INSERTION;  Surgeon: Deboraha Sprang, MD;  Location: [facility] CATH LAB;  Service: Cardiovascular;  Laterality: N/A;  . RECTOCELE REPAIR     with cystocele repair  . TONSILLECTOMY    . TOTAL HIP ARTHROPLASTY Right   . TOTAL HIP ARTHROPLASTY Left 06/12/2013   Procedure: LEFT TOTAL HIP ARTHROPLASTY ANTERIOR APPROACH;  Surgeon: Mcarthur Rossetti, MD;  Location: [facility];  Service:  Orthopedics;  Laterality: Left;  . TYMPANOPLASTY  1973   right  . ULNAR NERVE TRANSPOSITION Right   . VENTRAL HERNIA REPAIR  05/30/2011   Procedure: HERNIA REPAIR VENTRAL ADULT;  Surgeon: Haywood Lasso, MD;  Location: [facility];  Service: General;  Laterality: Right;  repair right spigelian hernia     OB History   None      Home Medications    Prior to Admission medications   Medication Sig Start Date End Date Taking? Authorizing Provider  acetaminophen (TYLENOL) 325 MG tablet Take 2 tablets (650 mg total) by mouth every 6 (six) hours as needed for mild pain. 10/08/14   Regalado, Belkys A, MD  albuterol (PROVENTIL HFA;VENTOLIN HFA) 108 (90 BASE) MCG/ACT inhaler Inhale 2 puffs  into the lungs every 6 (six) hours as needed. For shortness of breath    [provider]  b complex vitamins capsule Take 1 capsule by mouth daily.     [provider]  cephALEXin (KEFLEX) 500 MG capsule TAKE 4 CAPSULES BY MOUTH 1 HOUR PRIOR TO APPOINTMENT 10/09/16   [provider]  cetirizine (ZYRTEC) 10 MG tablet Take by mouth.    [provider]  cholecalciferol (VITAMIN D) 1000 UNITS tablet Take 1,000 Units by mouth daily.    [provider]  cycloSPORINE (RESTASIS) 0.05 % ophthalmic emulsion Apply to eye.    [provider]  EPINEPHrine (EPIPEN 2-PAK) 0.3 mg/0.3 mL IJ SOAJ injection Inject 0.3 mLs (0.3 mg total) into the muscle once. 03/24/15   Kozlow, Donnamarie Poag, MD  escitalopram (LEXAPRO) 10 MG tablet Take 10 mg by mouth daily.    [provider]  estradiol (ESTRING) 2 MG vaginal ring Place 2 mg vaginally every 3 (three) months. follow package directions    [provider]  estradiol (VIVELLE-DOT) 0.025 MG/24HR Place 1 patch onto the skin 2 (two) times a week. Applies new patch on Sunday & Thursday    [provider]  fluocinonide (LIDEX) 0.05 % external solution 1 APPLICATION ON THE SKIN AS DIRECTED. APPLY TO  SCALP BID FOR 1-2 WEEKS PRN FLARE AS DIRECTED 12/12/16   [provider]  fluticasone (FLONASE) 50 MCG/ACT nasal spray Place 1 spray into both nostrils daily.    [provider]  KLOR-CON M10 10 MEQ tablet TAKE 1 TABLET DAILY 04/08/17   Deboraha Sprang, MD  levothyroxine (SYNTHROID, LEVOTHROID) 100 MCG tablet Take 1 tablet by mouth daily. 02/11/18   [provider]  Magnesium 500 MG CAPS Take 1 capsule by mouth daily.    [provider]  meclizine (ANTIVERT) 25 MG tablet Take 0.5 tablets (12.5 mg total) by mouth 3 (three) times daily. 02/16/16   Kathrynn Ducking, MD  Melatonin 1 MG TABS Take 1.5 tablets by mouth at bedtime as needed.     [provider]  midodrine (PROAMATINE) 5 MG tablet Take 1 tablet by mouth every 3 (three) hours. 01/26/18   [provider]  montelukast (SINGULAIR) 10 MG tablet TAKE 1 TABLET DAILY 09/04/17   Kozlow, Donnamarie Poag, MD  Probiotic Product (ALIGN) 4 MG CAPS Take 4 mg by mouth at bedtime.     [provider]  RABEprazole (ACIPHEX) 20 MG tablet  07/16/17   [provider]  ranitidine (ZANTAC) 300 MG capsule TAKE 1 CAPSULE EVERY EVENING 09/05/17   Kozlow, Donnamarie Poag, MD  vitamin C (ASCORBIC ACID) 500 MG tablet Take by mouth.    [provider]    Family History Family History  Problem Relation Age of Onset  . Heart disease Father   . Colon cancer Mother 72  . Depression Mother   . Pancreatic cancer Sister 34  . Diverticulitis Sister   . Uterine cancer Maternal Grandmother   . Heart disease Brother   . Heart disease Paternal Grandmother   . Heart disease Paternal Grandfather   . Throat cancer Maternal Uncle   . Heart disease Paternal Aunt   . Heart disease Paternal Uncle   . Heart disease Brother   . Thrombosis Maternal Aunt   . Cancer Maternal Uncle        NOS  . Diabetes Unknown        aunt    Social History Social History  Tobacco Use  . Smoking status: Never Smoker  . Smokeless  tobacco: Never Used  Substance Use Topics  . Alcohol use: Yes    Alcohol/week: 7.0 standard drinks    Types: 7 Glasses of wine per week    Comment: occasional  . Drug use: No     Allergies   Anesthetics, amide; Clindamycin; Neomycin; Shellfish allergy; Sulfonamide derivatives; Zolpidem tartrate; Azithromycin; Bactroban; Ciprofloxacin; Codeine; Meperidine hcl; Morphine; Neosporin [neomycin-polymyxin-gramicidin]; Nitrofurantoin; Other; Penicillins; Tramadol; and Latex   Review of Systems Review of Systems  All other systems reviewed and are negative.    Physical Exam Updated Vital Signs BP (!) 170/70   Pulse 72   Temp (!) 97.4 F (36.3 C) (Oral)   Resp 20   Ht 1.702 m ('5\' 7"' )   Wt 74 kg   SpO2 100%   BMI 25.55 kg/m   Physical Exam  Constitutional: She is oriented to person, place, and time. She appears well-developed and well-nourished. She appears ill. She appears distressed.  HENT:  Head: Normocephalic and atraumatic.  Right Ear: External ear normal.  Left Ear: External ear normal.  Mouth/Throat: Oropharynx is clear and moist.  Eyes: Conjunctivae are normal. Right eye exhibits no discharge. Left eye exhibits no discharge. No scleral icterus.  Neck: Neck supple. No tracheal deviation present.  Cardiovascular: Normal rate, regular rhythm and intact distal pulses.  Pulmonary/Chest: Effort normal and breath sounds normal. No stridor. No respiratory distress. She has no wheezes. She has no rales.  Abdominal: Soft. Bowel sounds are normal. She exhibits no distension. There is no tenderness. There is no rebound and no guarding.  Musculoskeletal: She exhibits no edema or tenderness.  Neurological: She is oriented to person, place, and time. She has normal strength. No cranial nerve deficit (No facial droop, extraocular movements intact, tongue midline ) or sensory deficit. She exhibits normal muscle tone. She displays no seizure activity. Coordination normal.  No pronator drift  bilateral upper extrem, able to hold both legs off bed for 5 seconds, sensation intact in all extremities, no visual field cuts, no left or right sided neglect, abnormal finger-nose exam bilaterally, no nystagmus noted   Skin: Skin is warm and dry. No rash noted.  Psychiatric: She has a normal mood and affect.  Nursing note and vitals reviewed.    ED Treatments / Results  Labs (all labs ordered are listed, but only abnormal results are displayed) Labs Reviewed  I-STAT CHEM 8, ED - Abnormal; Notable for the following components:      Result Value   Potassium 3.4 (*)    Glucose, Bld 115 (*)    Calcium, Ion 1.08 (*)    TCO2 21 (*)    All other components within normal limits  CBC  DIFFERENTIAL  ETHANOL  COMPREHENSIVE METABOLIC PANEL  RAPID URINE DRUG SCREEN, HOSP PERFORMED  URINALYSIS, ROUTINE W REFLEX MICROSCOPIC  PROTIME-INR  APTT  I-STAT TROPONIN, ED    EKG EKG Interpretation  Date/Time:  Friday March 21 2018 13:49:36 EST Ventricular Rate:  100 PR Interval:    QRS Duration: 162 QT Interval:  450 QTC Calculation: 581 R Axis:   -84 Text Interpretation:  Electronic ventricular pacemaker No significant change since last tracing Confirmed by Dorie Rank [phone]) on 03/21/2018 2:22:22 PM   Radiology Ct Head Code Stroke Wo Contrast  Result Date: 03/21/2018 CLINICAL DATA:  Code stroke. 72 year old female with right facial droop, dizziness. EXAM: CT HEAD WITHOUT CONTRAST TECHNIQUE: Contiguous axial images were obtained from the base of  the skull through the vertex without intravenous contrast. COMPARISON:  Brain MRI 09/12/2016, head CT 02/22/2016. FINDINGS: Brain: Stable cerebral volume. No midline shift, ventriculomegaly, mass effect, evidence of mass lesion, intracranial hemorrhage or evidence of cortically based acute infarction. Minimal to mild for age white matter hypodensity appears stable since 2017. Vascular: Mild Calcified atherosclerosis at the skull base. No suspicious  intracranial vascular hyperdensity. Skull: Negative. Sinuses/Orbits: Paranasal sinuses and mastoids are stable and well pneumatized. Tympanic cavities remain clear. Other: Stable and negative orbit and scalp soft tissues. ASPECTS ([place] Stroke Program Early CT Score) - Ganglionic level infarction (caudate, lentiform nuclei, internal capsule, insula, M1-M3 cortex): 7 - Supraganglionic infarction (M4-M6 cortex): 3 Total score (0-10 with 10 being normal): 10 IMPRESSION: 1. Stable since 2017 and negative for age noncontrast head CT. 2. ASPECTS is 10. 3. These results were communicated to Dr. Rory Percy at 2:35 pmon 11/8/2019by text page via the [facility] messaging system. Electronically Signed   By: Genevie Ann M.D.   On: 03/21/2018 14:35    Procedures .Critical Care Performed by: Dorie Rank, MD Authorized by: Dorie Rank, MD   Critical care provider statement:    Critical care time (minutes):  30   Critical care was time spent personally by me on the following activities:  Discussions with consultants, evaluation of patient's response to treatment, examination of patient, ordering and performing treatments and interventions, ordering and review of laboratory studies, ordering and review of radiographic studies, pulse oximetry, re-evaluation of patient's condition, obtaining history from patient or surrogate and review of old charts   (including critical care time)  Medications Ordered in ED Medications  sodium chloride 0.9 % bolus 500 mL (has no administration in time range)    Followed by  0.9 %  sodium chloride infusion (has no administration in time range)  alteplase (ACTIVASE) 1 mg/mL infusion 66.6 mg (66.6 mg Intravenous New Bag/Given 03/21/18 1431)    Followed by  0.9 %  sodium chloride infusion (has no administration in time range)   stroke: mapping our early stages of recovery book (has no administration in time range)  0.9 %  sodium chloride infusion (has no administration in time range)    acetaminophen (TYLENOL) tablet 650 mg (has no administration in time range)    Or  acetaminophen (TYLENOL) solution 650 mg (has no administration in time range)    Or  acetaminophen (TYLENOL) suppository 650 mg (has no administration in time range)  senna-docusate (Senokot-S) tablet 1 tablet (has no administration in time range)  pantoprazole (PROTONIX) injection 40 mg (has no administration in time range)  labetalol (NORMODYNE,TRANDATE) injection 20 mg (has no administration in time range)    And  clevidipine (CLEVIPREX) infusion 0.5 mg/mL (has no administration in time range)  ondansetron (ZOFRAN) injection 4 mg (4 mg Intravenous Given 03/21/18 1420)  iopamidol (ISOVUE-370) 76 % injection 50 mL (50 mLs Intravenous Contrast Given 03/21/18 1440)     Initial Impression / Assessment and Plan / ED Course  I have reviewed the triage vital signs and the nursing notes.  Pertinent labs & imaging results that were available during my care of the patient were reviewed by me and considered in my medical decision making (see chart for details).   Patient symptoms are concerning for possible posterior circulation stroke with her acute onset of vertigo symptoms associated with her visual difficulty and abnormal finger-nose exam.  Code stroke activated.    Patient was seen by Dr. Rory Percy stroke team.  Patient symptoms were concerning  for stroke and the patient was given TPA.  Admit to neuro ICU for further treatment.  Final Clinical Impressions(s) / ED Diagnoses   Final diagnoses:  Cerebrovascular accident (CVA), unspecified mechanism ([place])     Dorie Rank, MD 03/21/18 1505

## 2018-03-21 NOTE — H&P (Addendum)
NEURO HOSPITALIST  CONSULT   Chief Complaint: Dizziness  History obtained from:  Patient    HPI:                                                                                                                                         [name] is an 72 y.o. female  With PMH significant for pacemaker, CNS neuropathy, right side breast cancer, hypothyroidism presented to [facility] via PV c/o of sudden dizziness. Code stroke was called.   No prior stroke history. Per patient has had BPPV before and this Is different. She reports that she woke up this morning in her usual state of health. At 11:30 had a sudden onset of dizziness. Feeling unsteady with walking. She says that the room is not spinning, but that objects are moving across her field of vision when she tried to move her head.  Also reports that she has diplopia at baseline up to 6 ft but the diplopia is worse than it has ever been.  The dizziness became worse about 12:30.  She has in the past had some dizziness attributed to BPPV and at one point also told she has complex migraine. She insisted that this dizziness is completely different. Denies any falls or recent head trauma, any focal weakness. Endorses nausea and vomiting.  She is extremely nauseous, no vomiting. No headache. Visual symptoms as above.  No fever, chills, SOB, CP, abd pain/. No recent surgeries, not on anticoag, no bleeding diathesis. ED course: seen in room by EDP-->code activated-->taken for [facility] and advanced imaging after seen by neurology right away (neurologist was in ER and saw the patient even before the page went out) [facility]: no hemorrhage. CTA: no LVO BP: 166/84 BG: 115  Date last known well: Date: 03/21/2018 Time last known well: Time: 11:30 tPA Given: Yes; bolus started @ 1431 Modified Rankin: Rankin Score=0 NIHSS: 5  Past Medical History:  Diagnosis Date  . ADHD (attention deficit hyperactivity disorder)    . Allergy    trees/pollen, mold, fungus, dust mites. Takes allergy shots  . Arthritis    PAIN AND OA LEFT HIP  . Asthma    allergist Dr Olena Heckle- monthly allergy injections  . Autonomic dysfunction    CENTRAL NERVOUS SYSTEM NEUROPATHY - DX BY DR. Erling Cruz MORE THAN 10 YRS AGO - AND IT IS FELT TO CONTRIBUTE TO THE AUTOMIC  DYSFUNCTION-- PT HAS NUMBNESS LEGS AND FEET AND SOMETIIMES TIPS OF FINGER, SEVERE CONSTIPATION( NO SENSATION TO HAVE BM ), DOUBLE VISION, ORTHOSTATIC HYPOTENSION  . Blood transfusion   . Breast cancer ([place])    right side  .  Bruises easily   . Cataract   . Chest pain    a. 12/2012 Cath: nl cors, EF 55-65%.  . Complication of anesthesia    reaction to some anesthetics/ 7/12 anesth record on chart- states prefers epidural  . Depression   . Diverticulosis   . Dysrhythmia    HX OF HIGH GRADE HEART BLOCK - REQUIRED PACEMAKER INSERTION  . Esophageal stricture   . Gastritis   . GERD (gastroesophageal reflux disease)   . H/O hiatal hernia   . Hemorrhoids   . Hernia of abdominal wall    spigelian hernia RLQ - SURGERY TO REPAIR  . Hypothyroidism   . Interstitial cystitis   . Latex allergy, contact dermatitis   . Mallory - Weiss tear    HEALED   . Neuromuscular disorder (HCC)    central nervous system neuropathy- seen per Dr Erling Cruz  . Pacemaker   . Pericardial effusion    a. 12/2012 following ppm placement;  b. 01/01/2013 Echo: EF 55-60%, small pericardial effusion w/o RV collapse-->No need for tap/window.  Marland Kitchen PONV (postoperative nausea and vomiting)    pt needs scop patch  . Recurrent upper respiratory infection (URI) 1/13- to present   bronchitis following surgery- states improved but still with cough. OV with [facility] Dr [name] 09/06/11 on chart  . Shortness of breath    AT TIMES - BUT MUCH IMPROVED AFTER PACEMAKER WAS REPROGRAMED.  Marland Kitchen Symptomatic bradycardia    a. 12/2012 s/p MDT dual chamber PPM, ser # [id number]; b. 12/2012 post-op course complicated by pericardial  effusinon req lead revision.  . Thyroid disease     Past Surgical History:  Procedure Laterality Date  . ABDOMINAL HYSTERECTOMY  1974  . BACK SURGERY     cervical fusion 4-5 with plate  . BLADDER SUSPENSION    . BREAST BIOPSY  2002   NO BLOOD PRESSURES ON RIGHT SIDE/   s/p  axillary node dissection  . CYSTO WITH HYDRODISTENSION  09/07/2011   Procedure: CYSTOSCOPY/HYDRODISTENSION;  Surgeon: Ailene Rud, MD;  Location: [facility];  Service: Urology;  Laterality: N/A;  INSTILLATION OF MARCAINE/PYRIDIUM INSTILLATION OF MARCAINE/KENALOG  . CYSTOSCOPY  1975, 2007  . EYE SURGERY     LASIK EYE SURGERY BILATERAL  . LAPAROSCOPY  1973  . LEAD REVISION N/A 12/30/2012   Procedure: LEAD REVISION;  Surgeon: Evans Lance, MD;  Location: [facility] CATH LAB;  Service: Cardiovascular;  Laterality: N/A;  . LEFT HEART CATHETERIZATION WITH CORONARY ANGIOGRAM N/A 12/29/2012   Procedure: LEFT HEART CATHETERIZATION WITH CORONARY ANGIOGRAM;  Surgeon: Peter M Martinique, MD;  Location: [facility] CATH LAB;  Service: Cardiovascular;  Laterality: N/A;  . MASTECTOMY MODIFIED RADICAL     right; with immediate reconstruction  . MYRINGOPLASTY  1962  . NECK SURGERY     c4-5 ruptured disc  . OOPHORECTOMY  1982  . PACEMAKER INSERTION    . PERMANENT PACEMAKER INSERTION N/A 12/29/2012   Procedure: PERMANENT PACEMAKER INSERTION;  Surgeon: Deboraha Sprang, MD;  Location[facility] CATH LAB;  Service: Cardiovascular;  Laterality: N/A;  . RECTOCELE REPAIR     with cystocele repair  . TONSILLECTOMY    . TOTAL HIP ARTHROPLASTY Right   . TOTAL HIP ARTHROPLASTY Left 06/12/2013   Procedure: LEFT TOTAL HIP ARTHROPLASTY ANTERIOR APPROACH;  Surgeon: Mcarthur Rossetti, MD;  Location: [facility];  Service: Orthopedics;  Laterality: Left;  . TYMPANOPLASTY  1973   right  . ULNAR NERVE TRANSPOSITION Right   . VENTRAL HERNIA REPAIR  05/30/2011  Procedure: HERNIA REPAIR VENTRAL ADULT;  Surgeon: Haywood Lasso, MD;  Location: [place];  Service: General;  Laterality: Right;  repair right spigelian hernia    Family History  Problem Relation Age of Onset  . Heart disease Father   . Colon cancer Mother 32  . Depression Mother   . Pancreatic cancer Sister 85  . Diverticulitis Sister   . Uterine cancer Maternal Grandmother   . Heart disease Brother   . Heart disease Paternal Grandmother   . Heart disease Paternal Grandfather   . Throat cancer Maternal Uncle   . Heart disease Paternal Aunt   . Heart disease Paternal Uncle   . Heart disease Brother   . Thrombosis Maternal Aunt   . Cancer Maternal Uncle        NOS  . Diabetes Unknown        aunt         Social History:  reports that she has never smoked. She has never used smokeless tobacco. She reports that she drinks about 7.0 standard drinks of alcohol per week. She reports that she does not use drugs.  Allergies:  Allergies  Allergen Reactions  . Anesthetics, Amide Other (See Comments)    Patient unsure of names, however multiples cause swelling of airway & nausea  . Clindamycin Swelling  . Neomycin Swelling  . Shellfish Allergy Other (See Comments)    Neurotoxic reaction  . Sulfonamide Derivatives Anaphylaxis and Swelling  . Zolpidem Tartrate Other (See Comments)    Jerking motions   . Azithromycin Other (See Comments)    Severe gastritis   . Bactroban Other (See Comments)    Causes sores in nose  . Ciprofloxacin     REACTION: joint swelling  . Codeine Swelling  . Meperidine Hcl Nausea Only    Hallucinations   . Morphine Nausea Only    Hallucinations   . Neosporin [Neomycin-Polymyxin-Gramicidin] Hives and Dermatitis    All topical "orin's ointment"  . Nitrofurantoin     REACTION: neuropathy in legs  . Other     ALLERGIC TO WARM WATER SHELLFISH  PT STATES PLEASE NOTE SHE CAN TAKE OXYCODONE AND TYLENOL FOR PAIN -NOT ALLERGIC  . Penicillins Other (See Comments)    Swelling in joints  . Tramadol     Makes jerk  . Latex Rash     Medications:                                                                                                                           Current Facility-Administered Medications  Medication Dose Route Frequency Provider Last Rate Last Dose  . sodium chloride 0.9 % bolus 500 mL  500 mL Intravenous Once Dorie Rank, MD       Followed by  . 0.9 %  sodium chloride infusion  100 mL/hr Intravenous Continuous Dorie Rank, MD      . alteplase (ACTIVASE) 1 mg/mL infusion 66.6 mg  0.9 mg/kg  Intravenous Once Amie Portland, MD       Followed by  . 0.9 %  sodium chloride infusion  50 mL Intravenous Once Amie Portland, MD      . ondansetron [facility]) injection 4 mg  4 mg Intravenous Once Dorie Rank, MD       Current Outpatient Medications  Medication Sig Dispense Refill  . acetaminophen (TYLENOL) 325 MG tablet Take 2 tablets (650 mg total) by mouth every 6 (six) hours as needed for mild pain. 30 tablet 0  . albuterol (PROVENTIL HFA;VENTOLIN HFA) 108 (90 BASE) MCG/ACT inhaler Inhale 2 puffs into the lungs every 6 (six) hours as needed. For shortness of breath    . b complex vitamins capsule Take 1 capsule by mouth daily.     . cephALEXin (KEFLEX) 500 MG capsule TAKE 4 CAPSULES BY MOUTH 1 HOUR PRIOR TO APPOINTMENT  0  . cetirizine (ZYRTEC) 10 MG tablet Take by mouth.    . cholecalciferol (VITAMIN D) 1000 UNITS tablet Take 1,000 Units by mouth daily.    . cycloSPORINE (RESTASIS) 0.05 % ophthalmic emulsion Apply to eye.    Marland Kitchen EPINEPHrine (EPIPEN 2-PAK) 0.3 mg/0.3 mL IJ SOAJ injection Inject 0.3 mLs (0.3 mg total) into the muscle once. 2 Device 3  . escitalopram (LEXAPRO) 10 MG tablet Take 10 mg by mouth daily.    Marland Kitchen estradiol (ESTRING) 2 MG vaginal ring Place 2 mg vaginally every 3 (three) months. follow package directions    . estradiol (VIVELLE-DOT) 0.025 MG/24HR Place 1 patch onto the skin 2 (two) times a week. Applies new patch on Sunday & Thursday    . fluocinonide (LIDEX) 0.05 % external solution 1  APPLICATION ON THE SKIN AS DIRECTED. APPLY TO SCALP BID FOR 1-2 WEEKS PRN FLARE AS DIRECTED  1  . fluticasone (FLONASE) 50 MCG/ACT nasal spray Place 1 spray into both nostrils daily.    Marland Kitchen KLOR-CON M10 10 MEQ tablet TAKE 1 TABLET DAILY 90 tablet 3  . levothyroxine (SYNTHROID, LEVOTHROID) 100 MCG tablet Take 1 tablet by mouth daily.    . Magnesium 500 MG CAPS Take 1 capsule by mouth daily.    . meclizine (ANTIVERT) 25 MG tablet Take 0.5 tablets (12.5 mg total) by mouth 3 (three) times daily. 45 tablet 1  . Melatonin 1 MG TABS Take 1.5 tablets by mouth at bedtime as needed.     . midodrine (PROAMATINE) 5 MG tablet Take 1 tablet by mouth every 3 (three) hours.    . montelukast (SINGULAIR) 10 MG tablet TAKE 1 TABLET DAILY 90 tablet 1  . Probiotic Product (ALIGN) 4 MG CAPS Take 4 mg by mouth at bedtime.     . RABEprazole (ACIPHEX) 20 MG tablet   11  . ranitidine (ZANTAC) 300 MG capsule TAKE 1 CAPSULE EVERY EVENING 90 capsule 1  . vitamin C (ASCORBIC ACID) 500 MG tablet Take by mouth.     Facility-Administered Medications Ordered in Other Encounters  Medication Dose Route Frequency Provider Last Rate Last Dose  . bupivacaine (MARCAINE) 0.5 % 10 mL, triamcinolone acetonide (KENALOG-40) 40 mg injection   Subcutaneous Once Carolan Clines, MD      . bupivacaine (MARCAINE) 0.5 % 15 mL, phenazopyridine (PYRIDIUM) 400 mg bladder mixture   Bladder Instillation Once Carolan Clines, MD         ROS:  ROS was performed and is negative except as noted in HPI    General Examination:                                                                                                      Blood pressure (!) 154/74, pulse 79, temperature (!) 97.4 F (36.3 C), temperature source Oral, resp. rate 19, height 5\' 7"  (1.702 m), weight 74 kg, SpO2 100 %.  HEENT-  Normocephalic, no  lesions, without obvious abnormality.  Normal external eye and conjunctiva. Cardiovascular- S1-S2 audible, pulses palpable throughout   Lungs-no rhonchi or wheezing noted, no excessive working breathing.  Saturations within normal limits on RA Abdomen- All 4 quadrants palpated and nontender Extremities- Warm, dry and intact Musculoskeletal-no joint tenderness, deformity or swelling Skin-warm and dry, no hyperpigmentation, vitiligo, or suspicious lesions  Neurological Examination Mental Status: Alert, oriented, thought content appropriate.  Speech fluent without evidence of aphasia.  Able to follow commands without difficulty. Cranial Nerves: II:  Visual fields grossly normal,  III,IV, VI: ptosis not present, extra-ocular movement mildly dysconjugate with right eye lagging while looking to left, also had diplopia in all directions. pupils equal, round, reactive to light and accommodation V,VII: smile asymmetric, subtle right facial droop noted,  facial light touch sensation normal bilaterally VIII: hearing normal bilaterally IX,X: uvula rises symmetrically XI: bilateral shoulder shrug XII: midline tongue extension Motor: Right : Upper extremity   5/5 Left:     Upper extremity   5/5  Lower extremity   4+/5  Lower extremity   4+/5 Tone and bulk:normal tone throughout; no atrophy noted Sensory:  light touch intact throughout, bilaterally Plantars: Right: downgoing   Left: downgoing Cerebellar: Ataxia present in RUE and RLE  Gait: deferred   Lab Results: Basic Metabolic Panel: Recent Labs  Lab 03/21/18 1420  NA 139  K 3.4*  CL 105  GLUCOSE 115*  BUN 11  CREATININE 0.70    CBC: Recent Labs  Lab 03/21/18 1420  HGB 14.6  HCT 43.0   Imaging: Ct Angio Head W Or Wo Contrast  Result Date: 03/21/2018 CLINICAL DATA:  72 year old female code stroke presentation with right facial droop and dizziness. EXAM: CT ANGIOGRAPHY HEAD AND NECK TECHNIQUE: Multidetector CT imaging of the  head and neck was performed using the standard protocol during bolus administration of intravenous contrast. Multiplanar CT image reconstructions and MIPs were obtained to evaluate the vascular anatomy. Carotid stenosis measurements (when applicable) are obtained utilizing NASCET criteria, using the distal internal carotid diameter as the denominator. CONTRAST:  47mL ISOVUE-370 IOPAMIDOL (ISOVUE-370) INJECTION 76% COMPARISON:  Head CT without contrast 1422 hours today. CTA head and neck 10/07/2014. FINDINGS: CTA NECK Skeleton: No acute osseous abnormality identified. Chronic cervical ACDF. Upper chest: Stable apical lung scarring. No superior mediastinal lymphadenopathy. Stable small volume likely physiologic superior pericardial recess fluid. Partially visible left chest cardiac pacemaker. Other neck: Stable. Partial atrophy of the right submandibular gland. No neck mass or lymphadenopathy. Aortic arch: Stable with mild tortuosity. Minimal atherosclerosis for age. Three vessel arch configuration. Right carotid system: Stable and negative aside  from tortuous distal cervical right ICA. Left carotid system: Stable and negative aside from mild tortuosity. Vertebral arteries: No proximal right subclavian artery stenosis. Mild tortuosity and plaque. Normal right vertebral artery origin. Mildly tortuous right V1 segment. No right vertebral artery stenosis to the skull base. Normal proximal left subclavian artery and left vertebral artery origin. Mildly tortuous left V1 segment. Mild bilateral vertebral artery ectasia, the right is dominant. No left vertebral stenosis to the skull base. CTA HEAD Posterior circulation: Stable distal vertebral arteries without stenosis. Normal PICA origins. Normal vertebrobasilar junction. Patent basilar artery without stenosis. Normal SCA and PCA origins. Posterior communicating arteries are diminutive or absent. Bilateral PCA branches are stable and within normal limits. Anterior  circulation: Both ICA siphons remain patent with no plaque or stenosis. Mild siphon tortuosity greater on the right. Normal ophthalmic artery origins. Patent carotid termini. Normal MCA and ACA origins. Diminutive or absent anterior communicating artery. Bilateral ACA branches are stable and within normal limits. Left MCA M1 segment, trifurcation and left MCA branches are stable and within normal limits. Right MCA M1 segment, bifurcation, and right MCA branches are stable and within normal limits. Venous sinuses: Patent. Anatomic variants: Mildly dominant right vertebral artery. Review of the MIP images confirms the above findings IMPRESSION: 1. No large vessel occlusion. Stable since 2016 and negative for age CTA Head and Neck. 2. These results were communicated to choose 1 at 2:56 pmon 11/8/2019by text page via the [facility] messaging system. Electronically Signed   By: Genevie Ann M.D.   On: 03/21/2018 14:57   Ct Angio Neck W Or Wo Contrast  Result Date: 03/21/2018 CLINICAL DATA:  72 year old female code stroke presentation with right facial droop and dizziness. EXAM: CT ANGIOGRAPHY HEAD AND NECK TECHNIQUE: Multidetector CT imaging of the head and neck was performed using the standard protocol during bolus administration of intravenous contrast. Multiplanar CT image reconstructions and MIPs were obtained to evaluate the vascular anatomy. Carotid stenosis measurements (when applicable) are obtained utilizing NASCET criteria, using the distal internal carotid diameter as the denominator. CONTRAST:  79mL ISOVUE-370 IOPAMIDOL (ISOVUE-370) INJECTION 76% COMPARISON:  Head CT without contrast 1422 hours today. CTA head and neck 10/07/2014. FINDINGS: CTA NECK Skeleton: No acute osseous abnormality identified. Chronic cervical ACDF. Upper chest: Stable apical lung scarring. No superior mediastinal lymphadenopathy. Stable small volume likely physiologic superior pericardial recess fluid. Partially visible left chest cardiac  pacemaker. Other neck: Stable. Partial atrophy of the right submandibular gland. No neck mass or lymphadenopathy. Aortic arch: Stable with mild tortuosity. Minimal atherosclerosis for age. Three vessel arch configuration. Right carotid system: Stable and negative aside from tortuous distal cervical right ICA. Left carotid system: Stable and negative aside from mild tortuosity. Vertebral arteries: No proximal right subclavian artery stenosis. Mild tortuosity and plaque. Normal right vertebral artery origin. Mildly tortuous right V1 segment. No right vertebral artery stenosis to the skull base. Normal proximal left subclavian artery and left vertebral artery origin. Mildly tortuous left V1 segment. Mild bilateral vertebral artery ectasia, the right is dominant. No left vertebral stenosis to the skull base. CTA HEAD Posterior circulation: Stable distal vertebral arteries without stenosis. Normal PICA origins. Normal vertebrobasilar junction. Patent basilar artery without stenosis. Normal SCA and PCA origins. Posterior communicating arteries are diminutive or absent. Bilateral PCA branches are stable and within normal limits. Anterior circulation: Both ICA siphons remain patent with no plaque or stenosis. Mild siphon tortuosity greater on the right. Normal ophthalmic artery origins. Patent carotid termini. Normal MCA and ACA  origins. Diminutive or absent anterior communicating artery. Bilateral ACA branches are stable and within normal limits. Left MCA M1 segment, trifurcation and left MCA branches are stable and within normal limits. Right MCA M1 segment, bifurcation, and right MCA branches are stable and within normal limits. Venous sinuses: Patent. Anatomic variants: Mildly dominant right vertebral artery. Review of the MIP images confirms the above findings IMPRESSION: 1. No large vessel occlusion. Stable since 2016 and negative for age CTA Head and Neck. 2. These results were communicated to choose 1 at 2:56 pmon  11/8/2019by text page via the [facility] messaging system. Electronically Signed   By: Genevie Ann M.D.   On: 03/21/2018 14:57   Ct Head Code Stroke Wo Contrast  Result Date: 03/21/2018 CLINICAL DATA:  Code stroke. 72 year old female with right facial droop, dizziness. EXAM: CT HEAD WITHOUT CONTRAST TECHNIQUE: Contiguous axial images were obtained from the base of the skull through the vertex without intravenous contrast. COMPARISON:  Brain MRI 09/12/2016, head CT 02/22/2016. FINDINGS: Brain: Stable cerebral volume. No midline shift, ventriculomegaly, mass effect, evidence of mass lesion, intracranial hemorrhage or evidence of cortically based acute infarction. Minimal to mild for age white matter hypodensity appears stable since 2017. Vascular: Mild Calcified atherosclerosis at the skull base. No suspicious intracranial vascular hyperdensity. Skull: Negative. Sinuses/Orbits: Paranasal sinuses and mastoids are stable and well pneumatized. Tympanic cavities remain clear. Other: Stable and negative orbit and scalp soft tissues. ASPECTS ([place] Stroke Program Early CT Score) - Ganglionic level infarction (caudate, lentiform nuclei, internal capsule, insula, M1-M3 cortex): 7 - Supraganglionic infarction (M4-M6 cortex): 3 Total score (0-10 with 10 being normal): 10 IMPRESSION: 1. Stable since 2017 and negative for age noncontrast head CT. 2. ASPECTS is 10. 3. These results were communicated to Dr. Rory Percy at 2:35 pmon 11/8/2019by text page via the [facility] messaging system. Electronically Signed   By: Genevie Ann M.D.   On: 03/21/2018 14:35      Laurey Morale, MSN, NP-C [facility] [phone]   03/21/2018, 2:26 PM   Attending physician note to follow with Assessment and plan .  Attending Neurohospitalist Addendum Patient seen and examined with APP/Resident. Agree with the history and physical as documented above. I have independently reviewed the chart, obtained history, review of systems and examined  the patient.I have personally reviewed pertinent head/neck/spine imaging (CT/MRI).  Assessment: 72 y.o. female  With PMH significant for pacemaker, CNS neuropathy, right side breast cancer, hypothyroidism presented to [facility] via PV c/o of sudden dizziness.  Examwith no nystagmus but disconjugate gaze, subtle rt facial droop, ataxia - concerning for posterior circulation. Although she has a h/o peripheral vertigo, this subjectively worse and different than prior episodes along with objective evidence of cranial neuropathy, as well as ataxia could also localize to a brainstem stroke. IVtPA risks benefits were discussed in detail with patient and IV tPA was initiated after reviewing contraindications with the patient. No LVO, hence not a candidate for EVT.  Impression: Evaluate for posterior circulation stroke Evaluate for peripheral vertigo   Plan: -- BP goal : Permissive HTN upto  180/176mmHg  --MRI Brain  --Echocardiogram -- Prophylactic therapy-Antiplatelet med -- High intensity Statin if LDL > 70  -- HgbA1c, fasting lipid panel -- PT consult, OT consult, Speech consult --Telemetry monitoring --Frequent neuro checks --Stroke swallow screen   CNS -Close neuro monitoring  RESP Asthma -continue Singulair -continue to monitor  CV -Aggressive BP control, goal SBP < 180/105 -Titrate  Labetalol/clevedipine as needed  - telemetry monitoring -Continue home  midodrine for orthostatic hypotension  GI/GU Gentle hydration Doc/senna continue antivert  ENDO - continue synthroid 100 mcg -goal HgbA1c < 7  Prophylaxis DVT: SCD's only NO anticoagulation for at least 24h after tPA PT/OT   Dispo: TBD  Diet: NPO until cleared by speech or bedside swallow eval  Code Status: Full Code    --- Amie Portland, MD [facility] Pager: [phone] If 7pm to 7am, please call on call as listed on AMION.  CRITICAL CARE ATTESTATION This patient is critically ill and at  significant risk of neurological worsening, death and care requires constant monitoring of vital signs, hemodynamics, respiratory, and cardiac monitoring. I spent 45  minutes of neurocritical care time performing neurological assessment, discussion with family, other specialists and medical decision making of high complexity in the care of  this patient.

## 2018-03-22 ENCOUNTER — Inpatient Hospital Stay (HOSPITAL_COMMUNITY): Payer: Medicare Other

## 2018-03-22 DIAGNOSIS — I5030 Unspecified diastolic (congestive) heart failure: Secondary | ICD-10-CM

## 2018-03-22 LAB — PROTIME-INR
INR: 1.12
PROTHROMBIN TIME: 14.3 s (ref 11.4–15.2)

## 2018-03-22 LAB — HEMOGLOBIN A1C
HEMOGLOBIN A1C: 5.4 % (ref 4.8–5.6)
MEAN PLASMA GLUCOSE: 108.28 mg/dL

## 2018-03-22 LAB — LIPID PANEL
CHOLESTEROL: 169 mg/dL (ref 0–200)
HDL: 46 mg/dL (ref >40)
LDL Cholesterol: 112 mg/dL — ABNORMAL HIGH (ref 0–99)
Total CHOL/HDL Ratio: 3.7 RATIO
Triglycerides: 54 mg/dL (ref <150)
VLDL: 11 mg/dL (ref 0–40)

## 2018-03-22 LAB — APTT: aPTT: 25 seconds (ref 24–36)

## 2018-03-22 LAB — ECHOCARDIOGRAM COMPLETE
Height: 67 in
Weight: 2557.34 oz

## 2018-03-22 MED ORDER — MIDODRINE HCL 5 MG PO TABS
5.0000 mg | ORAL_TABLET | Freq: Four times a day (QID) | ORAL | Status: DC
  Administered 2018-03-23 – 2018-03-24 (×5): 5 mg via ORAL
  Filled 2018-03-22 (×6): qty 1

## 2018-03-22 MED ORDER — ATORVASTATIN CALCIUM 40 MG PO TABS
40.0000 mg | ORAL_TABLET | Freq: Every day | ORAL | Status: DC
  Administered 2018-03-22 – 2018-03-23 (×2): 40 mg via ORAL
  Filled 2018-03-22 (×2): qty 1

## 2018-03-22 NOTE — Evaluation (Signed)
Physical Therapy Evaluation Patient Details Name: [name] MRN: [id number] DOB: [birth year] Today's Date: 03/22/2018   History of Present Illness  72 y.o. female admitted on 03/21/18 with vertigo, imbalance, N/V/D.  Code stroke called and tPA given in the ED.  Initial CT scan was negative, repeat CT is pending, and MRI is pending.  Pt with significant PMH of pericardial effusion, pacemaker, neuromuscular disorder, orthostatic hypotension, Mallory-weiss tear, breast CA R side s/p mastectomy, autonomic dysfunction (per pt her primary issue is orthostatic hypotension), ADHD, bil THA, neck fusion, bil cataract surgery (also, she is unable to converge closer than 6', so she has double vision close up).    Clinical Impression  Pt had some signs of coordination issues in her left LE especially and was mildly unsteady on her feet.  She has an interesting and extensive PMH.  She normally walks 3 miles with her grandson 3 days a week and she is looking less than  her normal right now.  She has been to [facility] for PT in the past and they know her and she will have less explaining of her extensive history if she returns there for some balance and gait training at discharge.  PT will continue to follow acutely for safe mobility progression     Follow Up Recommendations Outpatient PT;Other (comment)([facility] Rudell Cobb, PT)    Equipment Recommendations  None recommended by PT    Recommendations for Other Services   NA    Precautions / Restrictions Precautions Precautions: Fall Precaution Comments: mildly unsteady on her feet.       Mobility  Bed Mobility Overal bed mobility: Needs Assistance Bed Mobility: Supine to Sit     Supine to sit: Min assist     General bed mobility comments: Min assist to stabilize trunk when coming to sitting as she suddenly tipped backwards catching her balance.   Transfers Overall transfer level: Needs  assistance Equipment used: 1 person hand held assist Transfers: Sit to/from Stand Sit to Stand: Min assist         General transfer comment: Min assist to come to standing for the same reason as sitting to catch her balance as she came up.  Pt fell to the left and then was able, assisted to straighten herself. When we sat and did it again, it was much better.   Ambulation/Gait Ambulation/Gait assistance: Min guard Gait Distance (Feet): 200 Feet Assistive device: IV Pole;None Gait Pattern/deviations: Step-through pattern;Staggering right;Staggering left   Gait velocity interpretation: >2.62 ft/sec, indicative of community ambulatory General Gait Details: Pt with guarded mildly staggering gait pattern, slow to turn (reports she cannot do head turns at baseline).  We started holding the IV pole, but as she went I took it away from her and had her go without support.  Min guard assist for safety and balance.       Modified Rankin (Stroke Patients Only) Modified Rankin (Stroke Patients Only) Pre-Morbid Rankin Score: No significant disability Modified Rankin: Moderately severe disability     Balance Overall balance assessment: Needs assistance Sitting-balance support: Feet supported;Bilateral upper extremity supported Sitting balance-Leahy Scale: Poor Sitting balance - Comments: Initiallly poor requiring min assist to prevent posterior LOB, then once feet supported on the floor supervision.    Standing balance support: Single extremity supported Standing balance-Leahy Scale: Poor Standing balance comment: needs external assist.  Pertinent Vitals/Pain Pain Assessment: No/denies pain    Home Living Family/patient expects to be discharged to:: Private residence Living Arrangements: Spouse/significant other Available Help at Discharge: Family;Available 24 hours/day                  Prior Function Level of Independence: Independent          Comments: retired [employer]- likes to walk (3 mi 3 times per week with her grandson), plays bridge, does drive        Extremity/Trunk Assessment   Upper Extremity Assessment Upper Extremity Assessment: Defer to OT evaluation    Lower Extremity Assessment Lower Extremity Assessment: LLE deficits/detail LLE Deficits / Details: some mild dyscoridination when preforming the heel to shin test.  otherwise strength bil is 5/5 LLE Coordination: decreased gross motor    Cervical / Trunk Assessment Cervical / Trunk Assessment: Other exceptions Cervical / Trunk Exceptions: h/o cervical spine fusion  Communication   Communication: No difficulties  Cognition Arousal/Alertness: Awake/alert Behavior During Therapy: WFL for tasks assessed/performed Overall Cognitive Status: Within Functional Limits for tasks assessed                                        General Comments General comments (skin integrity, edema, etc.): Visual testing was difficult due to her convergance issues at baseline.  I had to stand further away and use my whole hand as a target to watch her track.  Pt was positive for orthostatic hypotension, but was asymptomatic during my session (systolic pressure started in the 160s and her standing systolic was 462)        Assessment/Plan    PT Assessment Patient needs continued PT services  PT Problem List Decreased balance;Decreased mobility;Decreased coordination;Decreased knowledge of use of DME       PT Treatment Interventions DME instruction;Gait training;Stair training;Functional mobility training;Therapeutic activities;Therapeutic exercise;Balance training;Neuromuscular re-education;Patient/family education    PT Goals (Current goals can be found in the Care Plan section)  Acute Rehab PT Goals Patient Stated Goal: to get back to her baseline PT Goal Formulation: With patient Time For Goal Achievement:  04/05/18 Potential to Achieve Goals: Good    Frequency Min 4X/week           AM-PAC PT "6 Clicks" Daily Activity  Outcome Measure Difficulty turning over in bed (including adjusting bedclothes, sheets and blankets)?: A Little Difficulty moving from lying on back to sitting on the side of the bed? : Unable Difficulty sitting down on and standing up from a chair with arms (e.g., wheelchair, bedside commode, etc,.)?: Unable Help needed moving to and from a bed to chair (including a wheelchair)?: A Little Help needed walking in hospital room?: A Little Help needed climbing 3-5 steps with a railing? : A Little 6 Click Score: 14    End of Session Equipment Utilized During Treatment: Gait belt Activity Tolerance: Patient tolerated treatment well Patient left: in chair;with call bell/phone within reach Nurse Communication: Mobility status PT Visit Diagnosis: Difficulty in walking, not elsewhere classified (R26.2);Other symptoms and signs involving the nervous system (V03.500)    Time: 9381-8299 PT Time Calculation (min) (ACUTE ONLY): 53 min   Charges:   PT Evaluation $PT Eval Moderate Complexity: 1 Mod PT Treatments $Gait Training: 8-22 mins $Therapeutic Activity: 8-22 mins      Dariann Huckaba B. Darlisa Spruiell, PT, DPT  Acute Rehabilitation [phone] pager [phone]) [phone] office  03/22/2018, 5:27 PM

## 2018-03-22 NOTE — Progress Notes (Signed)
  Echocardiogram 2D Echocardiogram has been performed.  [name] 03/22/2018, 10:21 AM

## 2018-03-22 NOTE — Progress Notes (Addendum)
PT Cancellation Note  Patient Details Name: [name] MRN: [id number] DOB: 1945/11/16   Cancelled Treatment:    Reason Eval/Treat Not Completed: Active bedrest order.  Please page me if bedrest is lifted so I may see the patient.  Thanks,  Barbarann Ehlers. Airrion Otting, PT, DPT  Acute Rehabilitation [phone] pager [phone]) [phone] office     Wells Guiles B Teal Bontrager 03/22/2018, 8:17 AM   03/22/2018 @1544  Called on call stroke MD and left a message re: bedrest.   Thanks,  Barbarann Ehlers. Uniqua Kihn, PT, DPT  Acute Rehabilitation [phone] pager [phone]) [phone] office

## 2018-03-22 NOTE — Progress Notes (Signed)
STROKE TEAM PROGRESS NOTE   HISTORY OF PRESENT ILLNESS (per record) [name] is an 72 y.o. female  With PMH significant for pacemaker, CNS neuropathy, right side breast cancer, hypothyroidism presented to [facility] c/o of sudden dizziness. Code stroke was called.   No prior stroke history. Per patient has had BPPV before and this Is different. She reports that she woke up this morning in her usual state of health. At 11:30 had a sudden onset of dizziness. Feeling unsteady with walking. She says that the room is not spinning, but that objects are moving across her field of vision when she tried to move her head.  Also reports that she has diplopia at baseline up to 6 ft but the diplopia is worse than it has ever been.  The dizziness became worse about 12:30.  She has in the past had some dizziness attributed to BPPV and at one point also told she has complex migraine. She insisted that this dizziness is completely different. Denies any falls or recent head trauma, any focal weakness. Endorses nausea and vomiting.  She is extremely nauseous, no vomiting. No headache. Visual symptoms as above.  No fever, chills, SOB, CP, abd pain/. No recent surgeries, not on anticoag, no bleeding diathesis. ED course: seen in room by EDP-->code activated-->taken for [facility] and advanced imaging after seen by neurology right away (neurologist was in ER and saw the patient even before the page went out) [facility]: no hemorrhage. CTA: no LVO BP: 166/84 BG: 115  Date last known well: Date: 03/21/2018 Time last known well: Time: 11:30 tPA Given: Yes; bolus started @ 1431 Modified Rankin: Rankin Score=0 NIHSS: 5   SUBJECTIVE (INTERVAL HISTORY) Husband at bedside.  Denies any vertigo, nausea, vomiting, or dysarthria today.  No PT to assess gait yet.   S/p IV tPA yesterday at 2:30 pm.  Awaiting MRI Brain today.  CT/CTA negative yesterday.  BP is well controlled.      OBJECTIVE Vitals:   03/22/18 0400  03/22/18 0500 03/22/18 0600 03/22/18 0700  BP: (!) 117/55 124/72 134/67 134/65  Pulse: 60 69 65 (!) 59  Resp: 13 16 (!) 21 16  Temp:      TempSrc:      SpO2: 96% 93% 97% 99%  Weight:      Height:        CBC:  Recent Labs  Lab 03/21/18 1415 03/21/18 1420  WBC 7.6  --   NEUTROABS 3.3  --   HGB 14.4 14.6  HCT 44.5 43.0  MCV 94.9  --   PLT 285  --     Basic Metabolic Panel:  Recent Labs  Lab 03/21/18 1415 03/21/18 1420  NA 137 139  K 3.6 3.4*  CL 105 105  CO2 20*  --   GLUCOSE 116* 115*  BUN 12 11  CREATININE 0.80 0.70  CALCIUM 9.3  --     Lipid Panel:     Component Value Date/Time   CHOL 169 03/22/2018 0012   TRIG 54 03/22/2018 0012   HDL 46 03/22/2018 0012   CHOLHDL 3.7 03/22/2018 0012   VLDL 11 03/22/2018 0012   LDLCALC 112 (H) 03/22/2018 0012   HgbA1c:  Lab Results  Component Value Date   HGBA1C 5.4 03/22/2018   Urine Drug Screen:     Component Value Date/Time   LABOPIA NONE DETECTED 03/21/2018 1539   COCAINSCRNUR NONE DETECTED 03/21/2018 1539   LABBENZ NONE DETECTED 03/21/2018 1539   AMPHETMU NONE DETECTED  03/21/2018 1539   THCU NONE DETECTED 03/21/2018 1539   LABBARB NONE DETECTED 03/21/2018 1539    Alcohol Level     Component Value Date/Time   ETH <10 03/21/2018 1415    IMAGING  Ct Angio Head W Or Wo Contrast Ct Angio Neck W Or Wo Contrast 03/21/2018 IMPRESSION:  No large vessel occlusion. Stable since 2016 and negative for age CTA Head and Neck.    Ct Head Code Stroke Wo Contrast 03/21/2018 IMPRESSION:  1. Stable since 2017 and negative for age noncontrast head CT.  2. ASPECTS is 10.    MRI Brain Wo Contrast - pending   Transthoracic Echocardiogram  03/22/2018 Study Conclusions - Left ventricle: The cavity size was normal. Wall thickness was   normal. Systolic function was normal. The estimated ejection   fraction was in the range of 60% to 65%. Wall motion was normal;   there were no regional wall motion abnormalities.  Doppler   parameters are consistent with abnormal left ventricular   relaxation (grade 1 diastolic dysfunction). - Aortic valve: There was no stenosis. - Mitral valve: There was no significant regurgitation. - Right ventricle: The cavity size was normal. Pacer wire or   catheter noted in right ventricle. Systolic function was normal. - Tricuspid valve: Peak RV-RA gradient (S): 16 mm Hg. - Pulmonary arteries: PA peak pressure: 19 mm Hg (S). - Inferior vena cava: The vessel was normal in size. The   respirophasic diameter changes were in the normal range (>= 50%),   consistent with normal central venous pressure. - Pericardium, extracardiac: A trivial pericardial effusion was   identified. Impressions: - Normal LV size with EF 60-65%. Normal RV size and systolic   function. No significant valvular abnormalities.    PHYSICAL EXAM Blood pressure 134/65, pulse (!) 59, temperature 97.9 F (36.6 C), temperature source Oral, resp. rate 16, height 5\' 7"  (1.702 m), weight 72.5 kg, SpO2 99 %.  Awake, alert, fully oriented. Fluent, comprehension, naming, repetition - intact. Strength 5/5 BUE and BLE. Coord- intact FTN, HTS bilaterally. Sensory -intact. EOMI, PERL, no nystagmus.  Face symmetric. Tongue midline. Gait - deferred.       ASSESSMENT/PLAN [name] is a 72 y.o. female with history of pacemaker, BPPV, CNS neuropathy, autonomic dysfunction, right side breast cancer, hypothyroidism presenting with sudden dizziness.  tPA Given: Yes; bolus started @ 1431  Suspected Stroke:  MRI pending  Resultant  Dizziness.  CT head - Stable since 2017 and negative for age noncontrast head CT.   MRI head - pending  MRA head - not performed  CTA H&N - No large vessel occlusion.  Carotid Doppler - CTA neck performed - carotid dopplers not indicated.  2D Echo  - EF 60 - 65%. No cardiac source of emboli identified.   LDL - 112  HgbA1c - 5.4  VTE prophylaxis -  SCDs  Diet - regular  No antithrombotic prior to admission, now on No antithrombotic S/P tPA  Patient counseled to be compliant with her antithrombotic medications  Ongoing aggressive stroke risk factor management  Therapy recommendations:  pending  Disposition:  Pending  Hypertension  Stable . Permissive hypertension (OK if < 220/120) but gradually normalize in 5-7 days . Long-term BP goal normotensive  Hyperlipidemia  Lipid lowering medication PTA:  none  LDL 112, goal < 70  Current lipid lowering medication - Add Lipitor 40 mg daily  Continue statin at discharge   Other Stroke Risk Factors  Advanced age  ETOH  use, advised to drink no more than 1 alcoholic beverage per day.   Other Active Problems  Autonomic dysfunction  BPPV    Plan  Possible cerebellar infarct with no discernible residuals.  She is s/p IV tPA.  Awaiting imaging at 24 hours later this afternoon to ensure no hemorrhagic transformation.  If negative for blood, will start ASA 325 mg qd.  The MRI will help me assess whether there was actually a stroke and extent of it.  BP is well controlled off treatment and in fact she is on Midodrine for Autonomic neuropathy.    Rogue Jury, MS, MD   Hospital day # 1   To contact Stroke Continuity provider, please refer to [email or web address] After hours, contact General Neurology

## 2018-03-22 NOTE — Progress Notes (Signed)
PT Cancellation Note  Patient Details Name: [name] MRN: [id number] DOB: May 31, 1945   Cancelled Treatment:    Reason Eval/Treat Not Completed: Patient at procedure or test/unavailable.  Pt is now off of bedrest, but currently in CT.  PT will check back tomorrow.  Thanks,    [name], PT, DPT  Acute Rehabilitation [phone] pager #[phone] office   03/22/2018, 3:58 PM

## 2018-03-23 MED ORDER — POTASSIUM CHLORIDE CRYS ER 20 MEQ PO TBCR
40.0000 meq | EXTENDED_RELEASE_TABLET | Freq: Once | ORAL | Status: AC
  Administered 2018-03-23: 40 meq via ORAL
  Filled 2018-03-23: qty 2

## 2018-03-23 MED ORDER — ASPIRIN EC 325 MG PO TBEC: 325.0000 mg | DELAYED_RELEASE_TABLET | Freq: Every day | ORAL | Status: DC

## 2018-03-23 MED ORDER — ASPIRIN EC 81 MG PO TBEC
81.0000 mg | DELAYED_RELEASE_TABLET | Freq: Every day | ORAL | Status: DC
  Administered 2018-03-24: 81 mg via ORAL
  Filled 2018-03-23 (×2): qty 1

## 2018-03-23 NOTE — Progress Notes (Signed)
SLP Cancellation Note  Patient Details Name: [name] MRN: [id number] DOB: 13-Oct-1945   Cancelled treatment:    Received orders for cognitive/language evaluation. Pt denies changes to speech, language, and cognition.  Pt participated in conversation appropriately.  CT negative.  MRI pending.  Pt has no ST needs at this time.  If there is any change in pt status, please re-consult speech therapy.   [name] [name] 03/23/2018, 1:42 PM

## 2018-03-23 NOTE — Progress Notes (Signed)
STROKE TEAM PROGRESS NOTE   HISTORY OF PRESENT ILLNESS (per record) [name] is an 72 y.o. female  With PMH significant for pacemaker, CNS neuropathy, right side breast cancer, hypothyroidism presented to [facility] via PV c/o of sudden dizziness. Code stroke was called.   No prior stroke history. Per patient has had BPPV before and this Is different. She reports that she woke up this morning in her usual state of health. At 11:30 had a sudden onset of dizziness. Feeling unsteady with walking. She says that the room is not spinning, but that objects are moving across her field of vision when she tried to move her head.  Also reports that she has diplopia at baseline up to 6 ft but the diplopia is worse than it has ever been.  The dizziness became worse about 12:30.  She has in the past had some dizziness attributed to BPPV and at one point also told she has complex migraine. She insisted that this dizziness is completely different. Denies any falls or recent head trauma, any focal weakness. Endorses nausea and vomiting.  She is extremely nauseous, no vomiting. No headache. Visual symptoms as above.  No fever, chills, SOB, CP, abd pain/. No recent surgeries, not on anticoag, no bleeding diathesis. ED course: seen in room by EDP-->code activated-->taken for [facility] and advanced imaging after seen by neurology right away (neurologist was in ER and saw the patient even before the page went out) [facility]: no hemorrhage. CTA: no LVO BP: 166/84 BG: 115  Date last known well: Date: 03/21/2018 Time last known well: Time: 11:30 tPA Given: Yes; bolus started @ 1431 Modified Rankin: Rankin Score=0 NIHSS: 5   SUBJECTIVE (INTERVAL HISTORY)   Denies any vertigo, nausea, vomiting, or dysarthria today.  .   S/p IV tPA 2 days ago.  Repeat CT Brain at 24 hours post tPA yesterday showed no bleed and no evidence of a cerebellar infarct.    CTA brain and neck negative.  TTE negative.  MRI Brain not done due to  PPM.    BP is well controlled off treatment.  LDL is only 112.  No smoking or DM.      OBJECTIVE Vitals:   03/22/18 2330 03/23/18 0317 03/23/18 0723 03/23/18 1237  BP: (!) 156/86 132/71 133/64 120/72  Pulse: 60 60 60 60  Resp: 18 18 18 18   Temp: 98.2 F (36.8 C) 98.3 F (36.8 C) 98.4 F (36.9 C) 98.1 F (36.7 C)  TempSrc: Oral Oral Oral Oral  SpO2: 95% 94% 96% 99%  Weight:      Height:        CBC:  Recent Labs  Lab 03/21/18 1415 03/21/18 1420  WBC 7.6  --   NEUTROABS 3.3  --   HGB 14.4 14.6  HCT 44.5 43.0  MCV 94.9  --   PLT 285  --     Basic Metabolic Panel:  Recent Labs  Lab 03/21/18 1415 03/21/18 1420  NA 137 139  K 3.6 3.4*  CL 105 105  CO2 20*  --   GLUCOSE 116* 115*  BUN 12 11  CREATININE 0.80 0.70  CALCIUM 9.3  --     Lipid Panel:     Component Value Date/Time   CHOL 169 03/22/2018 0012   TRIG 54 03/22/2018 0012   HDL 46 03/22/2018 0012   CHOLHDL 3.7 03/22/2018 0012   VLDL 11 03/22/2018 0012   LDLCALC 112 (H) 03/22/2018 0012   HgbA1c:  Lab Results  Component Value Date   HGBA1C 5.4 03/22/2018   Urine Drug Screen:     Component Value Date/Time   LABOPIA NONE DETECTED 03/21/2018 1539   COCAINSCRNUR NONE DETECTED 03/21/2018 1539   LABBENZ NONE DETECTED 03/21/2018 1539   AMPHETMU NONE DETECTED 03/21/2018 1539   THCU NONE DETECTED 03/21/2018 1539   LABBARB NONE DETECTED 03/21/2018 1539    Alcohol Level     Component Value Date/Time   ETH <10 03/21/2018 1415    IMAGING  Ct Angio Head W Or Wo Contrast Ct Angio Neck W Or Wo Contrast 03/21/2018 IMPRESSION:  No large vessel occlusion. Stable since 2016 and negative for age CTA Head and Neck.    Ct Head Code Stroke Wo Contrast 03/21/2018 IMPRESSION:  1. Stable since 2017 and negative for age noncontrast head CT.  2. ASPECTS is 10.   Ct Head Code Stroke Wo Contrast 03/22/2018 (18:01) IMPRESSION: Chronic changes as described. No definite acute infarction is demonstrated. The  patient had a pacemaker prior to the MR which was performed 09/12/2016. It is unclear if the current pacemaker is compatible with MR, but the previous device appears to have been.   MRI Brain Wo Contrast - pending   Transthoracic Echocardiogram  03/22/2018 Study Conclusions - Left ventricle: The cavity size was normal. Wall thickness was   normal. Systolic function was normal. The estimated ejection   fraction was in the range of 60% to 65%. Wall motion was normal;   there were no regional wall motion abnormalities. Doppler   parameters are consistent with abnormal left ventricular   relaxation (grade 1 diastolic dysfunction). - Aortic valve: There was no stenosis. - Mitral valve: There was no significant regurgitation. - Right ventricle: The cavity size was normal. Pacer wire or   catheter noted in right ventricle. Systolic function was normal. - Tricuspid valve: Peak RV-RA gradient (S): 16 mm Hg. - Pulmonary arteries: PA peak pressure: 19 mm Hg (S). - Inferior vena cava: The vessel was normal in size. The   respirophasic diameter changes were in the normal range (>= 50%),   consistent with normal central venous pressure. - Pericardium, extracardiac: A trivial pericardial effusion was   identified. Impressions: - Normal LV size with EF 60-65%. Normal RV size and systolic   function. No significant valvular abnormalities.    PHYSICAL EXAM Blood pressure 120/72, pulse 60, temperature 98.1 F (36.7 C), temperature source Oral, resp. rate 18, height 5\' 7"  (1.702 m), weight 72.5 kg, SpO2 99 %.  Awake, alert, fully oriented. Fluent, comprehension, naming, repetition - intact. Strength 5/5 BUE and BLE. Coord- intact FTN, HTS bilaterally. Sensory -intact. EOMI, PERL, no nystagmus.  Face symmetric. Tongue midline. Gait - deferred.       ASSESSMENT/PLAN Ms. [name] is a 72 y.o. female with history of pacemaker, BPPV, CNS neuropathy, autonomic dysfunction, right  side breast cancer, hypothyroidism presenting with sudden dizziness.  tPA Given: Yes; bolus started 03/21/2018 @ 1431  Suspected Stroke:  MRI pending  Resultant  Dizziness.  CT head - Stable since 2017 and negative for age noncontrast head CT.   MRI head - pending  MRA head - not performed  CTA H&N - No large vessel occlusion.  Carotid Doppler - CTA neck performed - carotid dopplers not indicated.  2D Echo  - EF 60 - 65%. No cardiac source of emboli identified.   LDL - 112  HgbA1c - 5.4  VTE prophylaxis - SCDs  Diet - regular  No antithrombotic prior  to admission, now on No antithrombotic S/P tPA  Patient counseled to be compliant with her antithrombotic medications  Ongoing aggressive stroke risk factor management  Therapy recommendations:  pending  Disposition:  Pending  Hypertension  Stable . Permissive hypertension (OK if < 220/120) but gradually normalize in 5-7 days . Long-term BP goal normotensive  Hyperlipidemia  Lipid lowering medication PTA:  none  LDL 112, goal < 70  Current lipid lowering medication - Add Lipitor 40 mg daily  Continue statin at discharge   Other Stroke Risk Factors  Advanced age  ETOH use, advised to drink no more than 1 alcoholic beverage per day.   Other Active Problems  Autonomic dysfunction  BPPV  Hypokalemia - supplement - recheck  The MRI tech reports that [employer] will no longer come to the hospital on weekends to assist with MRIs in pts with [employer] pacemakers without having a cardiology consult prior to [employer] being contacted. Apparently this is not a requirement Monday through Friday when the [employer] reps are already at the hospital. This policy needs further clarification as it directly affects patient care. Ms Mccrae MRI has not yet been performed for this reason. It can be done tomorrow.   Plan  No evidence of stroke on repeat CT.  I may miss a small cerebellar infarct on CT, but she really  has no stroke risk factors.  As precautionary measure, however, I do recommend continuing the ASA at a lower dose of 81 mg qd as well as Lipitor.  Telemetry has not shown any  A. Fib/flutter and Echo was normal.  She has had a long history of inner ear related vertigo and I will not put her through a TEE at this time.  She does need close follow with neurologist and ENT as outpatient.  I recommend observing her overnight and discharging her in the morning to follow up in 1-2 months.    Rogue Jury, MS, MD    Hospital day # 2   To contact Stroke Continuity provider, please refer to [email or web address] After hours, contact General Neurology

## 2018-03-23 NOTE — Progress Notes (Signed)
Physical Therapy Treatment Patient Details Name: [name] MRN: [id number] DOB: [birth year] Today's Date: 03/23/2018    History of Present Illness 72 y.o. female admitted on 03/21/18 with vertigo, imbalance, N/V/D.  Code stroke called and tPA given in the ED.  Initial CT scan was negative, repeat CT is pending, and MRI is pending.  Pt with significant PMH of pericardial effusion, pacemaker, neuromuscular disorder, orthostatic hypotension, Mallory-weiss tear, breast CA R side s/p mastectomy, autonomic dysfunction (per pt her primary issue is orthostatic hypotension), ADHD, bil THA, neck fusion, bil cataract surgery (also, she is unable to converge closer than 6', so she has double vision close up).      PT Comments    Session focused on discharge planning and answering patient's questions re: what would be different this time if she were to return (likely nothing, same approach). Answered questions re: previous referral to Neuro-ophthalmologist (when she finished her vestibular rehab) and rationale. With her diagnosis of convergence paralysis, do not anticipate vestibular rehab will be effective and would agree Neuro-ophthalmology input would be beneficial (however pt is not interested in prism glasses). At pt request, will dialogue with her previous OPPT (see below) re: whether she would benefit from returning to OP.    Follow Up Recommendations  Outpatient PT([facility] with Rudell Cobb, PT)     Equipment Recommendations  None recommended by PT    Recommendations for Other Services       Precautions / Restrictions Precautions Precautions: Fall    Mobility  Bed Mobility                  Transfers                    Ambulation/Gait                 Stairs             Wheelchair Mobility    Modified Rankin (Stroke Patients Only)       Balance                                             Cognition Arousal/Alertness: Awake/alert Behavior During Therapy: WFL for tasks assessed/performed Overall Cognitive Status: Within Functional Limits for tasks assessed                                        Exercises      General Comments General comments (skin integrity, edema, etc.): Patient greeted PT with "I don't need any therapy." Discussed her amazing progress s/p tPA and whether she is back to her baseline. (She feels she essentially is). Discussed her previous therapy at Outpatient Neurorehabilitation and whether it would be beneficial for her to return. She reports when she "graduated" Margreta Journey has wanted her to go see a Neuro-opthalmologist and she has not followed through on that. Reports she saw Dr. Gevena Cotton here in [place] prior to her Vestibular Therapy and he wanted to put prisms in her lenses and she did not want that. She continues to have "convergence paralysis" as it was diagnosed. She has not continued her vestibular rehab HEP and did not notice a change when she stopped doing the exercises. Patient agreed there was likely no reason to return  to OPPT and asked that I confer with her previous PT Rudell Cobb) to get her input. Will follow-up with her 03/24/18.       Pertinent Vitals/Pain Pain Assessment: No/denies pain    Home Living                      Prior Function            PT Goals (current goals can now be found in the care plan section) Acute Rehab PT Goals Patient Stated Goal: to go back home Time For Goal Achievement: 04/05/18 Potential to Achieve Goals: Good Progress towards PT goals: Not progressing toward goals - comment(refused mobility to address goals)    Frequency    Min 4X/week      PT Plan Current plan remains appropriate    Co-evaluation              AM-PAC PT "6 Clicks" Daily Activity  Outcome Measure  Difficulty turning over in bed (including adjusting bedclothes, sheets and  blankets)?: None Difficulty moving from lying on back to sitting on the side of the bed? : None Difficulty sitting down on and standing up from a chair with arms (e.g., wheelchair, bedside commode, etc,.)?: A Little Help needed moving to and from a bed to chair (including a wheelchair)?: A Little Help needed walking in hospital room?: A Little Help needed climbing 3-5 steps with a railing? : A Little 6 Click Score: 20    End of Session     Patient left: in bed;with call bell/phone within reach;with family/visitor present   PT Visit Diagnosis: Difficulty in walking, not elsewhere classified (R26.2);Other symptoms and signs involving the nervous system (R29.898)     Time: 1751-0258 PT Time Calculation (min) (ACUTE ONLY): 22 min  Charges:  $Self Care/Home Management: 8-22                        Jeanie Cooks Sereen Schaff, PT 03/23/2018, 2:45 PM

## 2018-03-23 NOTE — Evaluation (Signed)
Occupational Therapy Evaluation Patient Details Name: [name] MRN: [id number] DOB: 1946-03-06 Today's Date: 03/23/2018    History of Present Illness 72 y.o. female admitted on 03/21/18 with vertigo, imbalance, N/V/D.  Code stroke called and tPA given in the ED.  Initial CT scan was negative, repeat CT is pending, and MRI is pending.  Pt with significant PMH of pericardial effusion, pacemaker, neuromuscular disorder, orthostatic hypotension, Mallory-weiss tear, breast CA R side s/p mastectomy, autonomic dysfunction (per pt her primary issue is orthostatic hypotension), ADHD, bil THA, neck fusion, bil cataract surgery (also, she is unable to converge closer than 6', so she has double vision close up).     Clinical Impression   PATIENT WAS SEEN FOR SKILLED OT EVALUATION. PATIENT DID NOT PRESENT WITH ANY DEFICITS FOR B UE. PATIENT HAS LONG HX OF DOUBLE VISION WHICH HAS NOT WORSENED. PATIENT DOES HAVE HX OF DECREASED BALANCE AND PATIENT STATES SHE IS AT BASELINE FOR THIS AS WELL. PATIENT WAS ABEL TO PERFORM DRESSING AND GROOMING TASK AT MOD I LEVEL. PATIENT WAS MOD I WITH TRANSFER TO COMMODE AND WALK IN SHOWER. PATEINT DOES NOT NEED FURTHER SKILLED OT AND SHE IS AN AGREEMENT.     Follow Up Recommendations  No OT follow up    Equipment Recommendations  None recommended by OT    Recommendations for Other Services       Precautions / Restrictions Precautions Precautions: Fall Precaution Comments: mildly unsteady on her feet.       Mobility Bed Mobility   Bed Mobility: Supine to Sit;Sit to Supine     Supine to sit: Modified independent (Device/Increase time) Sit to supine: Modified independent (Device/Increase time)      Transfers       Sit to Stand: Modified independent (Device/Increase time)         General transfer comment: Mod i with mobility in the room    Balance                                           ADL either performed or assessed  with clinical judgement   ADL Overall ADL's : At baseline                                       General ADL Comments: Patient states she is at baseline Mod I     Vision Patient Visual Report: (pnt reports she has a long hx of double vision)       Perception     Praxis      Pertinent Vitals/Pain Pain Assessment: No/denies pain     Hand Dominance Right   Extremity/Trunk Assessment Upper Extremity Assessment Upper Extremity Assessment: Overall WFL for tasks assessed           Communication Communication Communication: No difficulties   Cognition Arousal/Alertness: Awake/alert Behavior During Therapy: WFL for tasks assessed/performed Overall Cognitive Status: Within Functional Limits for tasks assessed                                     General Comments       Exercises     Shoulder Instructions      Home Living Family/patient expects to be discharged to:: Private  residence Living Arrangements: Spouse/significant other Available Help at Discharge: Family;Available 24 hours/day Type of Home: House Home Access: Stairs to enter [employer] of Steps: 2(garage) Entrance Stairs-Rails: None Home Layout: Multi-level Alternate Level Stairs-Number of Steps: 12 Alternate Level Stairs-Rails: Can reach both Bathroom Shower/Tub: Occupational psychologist: Handicapped height     Home Equipment: Environmental consultant - 4 wheels;Shower seat - built in;Grab bars - tub/shower;Hand held shower head          Prior Functioning/Environment Level of Independence: Independent        Comments: retired [employer]- likes to walk (3 mi 3 times per week with her grandson), plays bridge, does drive        OT Problem List:        OT Treatment/Interventions:      OT Goals(Current goals can be found in the care plan section) Acute Rehab OT Goals Patient Stated Goal: to go back home  OT Frequency:     Barriers to D/C:             Co-evaluation              AM-PAC PT "6 Clicks" Daily Activity     Outcome Measure Help from another person eating meals?: None Help from another person taking care of personal grooming?: None Help from another person toileting, which includes using toliet, bedpan, or urinal?: None Help from another person bathing (including washing, rinsing, drying)?: None Help from another person to put on and taking off regular upper body clothing?: None Help from another person to put on and taking off regular lower body clothing?: None 6 Click Score: 24   End of Session Equipment Utilized During Treatment: Gait belt Nurse Communication: (ok therapy)  Activity Tolerance: Patient tolerated treatment well Patient left: in bed;with call bell/phone within reach;with bed alarm set  OT Visit Diagnosis: Unsteadiness on feet (R26.81)                Time: [id number] [id number] [id number] OT Time Calculation (min): 38 min Charges:  OT General Charges $OT Visit: 1 Visit OT Evaluation $OT Eval Low Complexity: 1 Low OT Treatments $Self Care/Home Management : 9-73 mins  6 CLICKS  Jency Schnieders 03/23/2018, 8:23 AM

## 2018-03-24 ENCOUNTER — Inpatient Hospital Stay (HOSPITAL_COMMUNITY): Payer: Medicare Other

## 2018-03-24 DIAGNOSIS — I6300 Cerebral infarction due to thrombosis of unspecified precerebral artery: Secondary | ICD-10-CM

## 2018-03-24 LAB — BASIC METABOLIC PANEL
Anion gap: 6 (ref 5–15)
BUN: 10 mg/dL (ref 8–23)
CHLORIDE: 110 mmol/L (ref 98–111)
CO2: 22 mmol/L (ref 22–32)
CREATININE: 0.71 mg/dL (ref 0.44–1.00)
Calcium: 8.9 mg/dL (ref 8.9–10.3)
GFR calc Af Amer: >60 mL/min (ref >60)
GFR calc non Af Amer: >60 mL/min (ref >60)
Glucose, Bld: 81 mg/dL (ref 70–99)
POTASSIUM: 4 mmol/L (ref 3.5–5.1)
Sodium: 138 mmol/L (ref 135–145)

## 2018-03-24 MED ORDER — CLOPIDOGREL BISULFATE 75 MG PO TABS: 75.0000 mg | ORAL_TABLET | Freq: Every day | ORAL | Status: DC

## 2018-03-24 MED ORDER — PANTOPRAZOLE SODIUM 40 MG PO TBEC: 40.0000 mg | DELAYED_RELEASE_TABLET | Freq: Every day | ORAL | Status: DC

## 2018-03-24 MED ORDER — CLOPIDOGREL BISULFATE 75 MG PO TABS: 75.0000 mg | ORAL_TABLET | Freq: Every day | ORAL | 0 refills | Status: DC

## 2018-03-24 MED ORDER — ASPIRIN 81 MG PO TBEC: 81.0000 mg | DELAYED_RELEASE_TABLET | Freq: Every day | ORAL | Status: DC

## 2018-03-24 MED ORDER — ATORVASTATIN CALCIUM 40 MG PO TABS: 40.0000 mg | ORAL_TABLET | Freq: Every day | ORAL | 2 refills | Status: DC

## 2018-03-24 NOTE — Progress Notes (Signed)
Attempted to see patient and she was out of the room. CM will attempt later.

## 2018-03-24 NOTE — Discharge Summary (Addendum)
Stroke Discharge Summary  Patient ID: [name]   MRN: [id number]      DOB: May 24, 1945  Date of Admission: 03/21/2018 Date of Discharge: 03/24/2018  Attending Physician:  Garvin Fila, MD, Stroke MD Consultant(s):    None  Patient's PCP:  Burnard Bunting, MD  DISCHARGE DIAGNOSIS:  Principal Problem:   Cerebral infarction [facility]) brainstem s/p IV tPA (not seen on MRI) Active Problems:   History of permanent cardiac pacemaker placement   Complicated migraine   HLD (hyperlipidemia)   Past Medical History:  Diagnosis Date  . ADHD (attention deficit hyperactivity disorder)   . Allergy    trees/pollen, mold, fungus, dust mites. Takes allergy shots  . Arthritis    PAIN AND OA LEFT HIP  . Asthma    allergist Dr Olena Heckle- monthly allergy injections  . Autonomic dysfunction    CENTRAL NERVOUS SYSTEM NEUROPATHY - DX BY DR. Erling Cruz MORE THAN 10 YRS AGO - AND IT IS FELT TO CONTRIBUTE TO THE AUTOMIC  DYSFUNCTION-- PT HAS NUMBNESS LEGS AND FEET AND SOMETIIMES TIPS OF FINGER, SEVERE CONSTIPATION( NO SENSATION TO HAVE BM ), DOUBLE VISION, ORTHOSTATIC HYPOTENSION  . Blood transfusion   . Breast cancer ([place])    right side  . Bruises easily   . Cataract   . Chest pain    a. 12/2012 Cath: nl cors, EF 55-65%.  . Complication of anesthesia    reaction to some anesthetics/ 7/12 anesth record on chart- states prefers epidural  . Depression   . Diverticulosis   . Dysrhythmia    HX OF HIGH GRADE HEART BLOCK - REQUIRED PACEMAKER INSERTION  . Esophageal stricture   . Gastritis   . GERD (gastroesophageal reflux disease)   . H/O hiatal hernia   . Hemorrhoids   . Hernia of abdominal wall    spigelian hernia RLQ - SURGERY TO REPAIR  . Hypothyroidism   . Interstitial cystitis   . Latex allergy, contact dermatitis   . Mallory - Weiss tear    HEALED   . Neuromuscular disorder (HCC)    central nervous system neuropathy- seen per Dr Erling Cruz  . Pacemaker   . Pericardial effusion    a.  12/2012 following ppm placement;  b. 01/01/2013 Echo: EF 55-60%, small pericardial effusion w/o RV collapse-->No need for tap/window.  Marland Kitchen PONV (postoperative nausea and vomiting)    pt needs scop patch  . Recurrent upper respiratory infection (URI) 1/13- to present   bronchitis following surgery- states improved but still with cough. OV with [facility] Dr [name] 09/06/11 on chart  . Shortness of breath    AT TIMES - BUT MUCH IMPROVED AFTER PACEMAKER WAS REPROGRAMED.  Marland Kitchen Symptomatic bradycardia    a. 12/2012 s/p MDT dual chamber PPM, ser # [id number]; b. 12/2012 post-op course complicated by pericardial effusinon req lead revision.  . Thyroid disease    Past Surgical History:  Procedure Laterality Date  . ABDOMINAL HYSTERECTOMY  1974  . BACK SURGERY     cervical fusion 4-5 with plate  . BLADDER SUSPENSION    . BREAST BIOPSY  2002   NO BLOOD PRESSURES ON RIGHT SIDE/   s/p  axillary node dissection  . CYSTO WITH HYDRODISTENSION  09/07/2011   Procedure: CYSTOSCOPY/HYDRODISTENSION;  Surgeon: Ailene Rud, MD;  Location: [facility];  Service: Urology;  Laterality: N/A;  INSTILLATION OF MARCAINE/PYRIDIUM INSTILLATION OF MARCAINE/KENALOG  . CYSTOSCOPY  1975, 2007  . EYE SURGERY     LASIK EYE SURGERY  BILATERAL  . LAPAROSCOPY  1973  . LEAD REVISION N/A 12/30/2012   Procedure: LEAD REVISION;  Surgeon: Evans Lance, MD;  Location: [facility] CATH LAB;  Service: Cardiovascular;  Laterality: N/A;  . LEFT HEART CATHETERIZATION WITH CORONARY ANGIOGRAM N/A 12/29/2012   Procedure: LEFT HEART CATHETERIZATION WITH CORONARY ANGIOGRAM;  Surgeon: Peter M Martinique, MD;  Location: [facility] CATH LAB;  Service: Cardiovascular;  Laterality: N/A;  . MASTECTOMY MODIFIED RADICAL     right; with immediate reconstruction  . MYRINGOPLASTY  1962  . NECK SURGERY     c4-5 ruptured disc  . OOPHORECTOMY  1982  . PACEMAKER INSERTION    . PERMANENT PACEMAKER INSERTION N/A 12/29/2012   Procedure: PERMANENT PACEMAKER INSERTION;  Surgeon:  Deboraha Sprang, MD;  Location: [facility] CATH LAB;  Service: Cardiovascular;  Laterality: N/A;  . RECTOCELE REPAIR     with cystocele repair  . TONSILLECTOMY    . TOTAL HIP ARTHROPLASTY Right   . TOTAL HIP ARTHROPLASTY Left 06/12/2013   Procedure: LEFT TOTAL HIP ARTHROPLASTY ANTERIOR APPROACH;  Surgeon: Mcarthur Rossetti, MD;  Location: [facility];  Service: Orthopedics;  Laterality: Left;  . TYMPANOPLASTY  1973   right  . ULNAR NERVE TRANSPOSITION Right   . VENTRAL HERNIA REPAIR  05/30/2011   Procedure: HERNIA REPAIR VENTRAL ADULT;  Surgeon: Haywood Lasso, MD;  Location: [place];  Service: General;  Laterality: Right;  repair right spigelian hernia    Allergies as of 03/24/2018      Reactions   Anesthetics, Amide Other (See Comments)   Patient unsure of names, however multiples cause swelling of airway & nausea   Clindamycin Swelling   Neomycin Swelling   Shellfish Allergy Other (See Comments)   Neurotoxic reaction   Sulfonamide Derivatives Anaphylaxis, Swelling   Zolpidem Tartrate Other (See Comments)   Jerking motions    Azithromycin Other (See Comments)   Severe gastritis    Bactroban Other (See Comments)   Causes sores in nose   Ciprofloxacin    REACTION: joint swelling   Codeine Swelling   Meperidine Hcl Nausea Only   Hallucinations   Morphine Nausea Only   Hallucinations    Neosporin [neomycin-polymyxin-gramicidin] Hives, Dermatitis   All topical "orin's ointment"   Nitrofurantoin    REACTION: neuropathy in legs   Other    ALLERGIC TO WARM WATER SHELLFISH PT STATES PLEASE NOTE SHE CAN TAKE OXYCODONE AND TYLENOL FOR PAIN -NOT ALLERGIC   Penicillins Other (See Comments)   Swelling in joints   Tramadol    Makes jerk   Latex Rash      Medication List    STOP taking these medications   ranitidine 300 MG capsule Commonly known as:  ZANTAC     TAKE these medications   albuterol 108 (90 Base) MCG/ACT inhaler Commonly known as:  PROVENTIL  HFA;VENTOLIN HFA Inhale 2 puffs into the lungs every 6 (six) hours as needed. For shortness of breath   ALIGN 4 MG Caps Take 4 mg by mouth at bedtime.   aspirin 81 MG EC tablet Take 1 tablet (81 mg total) by mouth daily. Start taking on:  03/25/2018   atorvastatin 40 MG tablet Commonly known as:  LIPITOR Take 1 tablet (40 mg total) by mouth daily at 6 PM.   b complex vitamins capsule Take 1 capsule by mouth daily.   cetirizine 10 MG tablet Commonly known as:  ZYRTEC Take 10 mg by mouth 2 (two) times daily.   cholecalciferol 1000  units tablet Commonly known as:  VITAMIN D Take 1,000 Units by mouth daily.   clopidogrel 75 MG tablet Commonly known as:  PLAVIX Take 1 tablet (75 mg total) by mouth daily.   cycloSPORINE 0.05 % ophthalmic emulsion Commonly known as:  RESTASIS Place 1 drop into both eyes 2 (two) times daily as needed (unk).   escitalopram 10 MG tablet Commonly known as:  LEXAPRO Take 10 mg by mouth daily.   estradiol 0.025 MG/24HR Commonly known as:  VIVELLE-DOT Place 1 patch onto the skin 2 (two) times a week. Applies new patch on Sunday & Thursday   estradiol 2 MG vaginal ring Commonly known as:  ESTRING Place 2 mg vaginally every 3 (three) months. follow package directions   fluocinonide 0.05 % external solution Commonly known as:  LIDEX 1 APPLICATION ON THE SKIN AS DIRECTED. APPLY TO SCALP BID FOR 1-2 WEEKS PRN FLARE AS DIRECTED   fluticasone 50 MCG/ACT nasal spray Commonly known as:  FLONASE Place 1 spray into both nostrils daily.   KLOR-CON M10 10 MEQ tablet Generic drug:  potassium chloride TAKE 1 TABLET DAILY   levothyroxine 100 MCG tablet Commonly known as:  SYNTHROID, LEVOTHROID Take 1 tablet by mouth daily.   Magnesium 500 MG Caps Take 1 capsule by mouth daily.   meclizine 25 MG tablet Commonly known as:  ANTIVERT Take 0.5 tablets (12.5 mg total) by mouth 3 (three) times daily. What changed:    when to take this  reasons to  take this   Melatonin 1 MG Tabs Take 1.5 tablets by mouth at bedtime as needed.   midodrine 5 MG tablet Commonly known as:  PROAMATINE Take 1 tablet by mouth every 3 (three) hours. Takes 4-5 times daily from waking until no later than 7 pm.   montelukast 10 MG tablet Commonly known as:  SINGULAIR TAKE 1 TABLET DAILY   RABEprazole 20 MG tablet Commonly known as:  ACIPHEX Take 20 mg by mouth daily.   vitamin C 500 MG tablet Commonly known as:  ASCORBIC ACID Take 500 mg by mouth daily.       LABORATORY STUDIES CBC    Component Value Date/Time   WBC 7.6 03/21/2018 1415   RBC 4.69 03/21/2018 1415   HGB 14.6 03/21/2018 1420   HCT 43.0 03/21/2018 1420   PLT 285 03/21/2018 1415   MCV 94.9 03/21/2018 1415   MCH 30.7 03/21/2018 1415   MCHC 32.4 03/21/2018 1415   RDW 13.0 03/21/2018 1415   LYMPHSABS 3.3 03/21/2018 1415   MONOABS 0.8 03/21/2018 1415   EOSABS 0.1 03/21/2018 1415   BASOSABS 0.0 03/21/2018 1415   CMP    Component Value Date/Time   NA 138 03/24/2018 0557   K 4.0 03/24/2018 0557   CL 110 03/24/2018 0557   CO2 22 03/24/2018 0557   GLUCOSE 81 03/24/2018 0557   BUN 10 03/24/2018 0557   CREATININE 0.71 03/24/2018 0557   CALCIUM 8.9 03/24/2018 0557   PROT 7.2 03/21/2018 1415   ALBUMIN 4.3 03/21/2018 1415   AST 30 03/21/2018 1415   ALT 23 03/21/2018 1415   ALKPHOS 44 03/21/2018 1415   BILITOT 0.6 03/21/2018 1415   GFRNONAA >60 03/24/2018 0557   GFRAA >60 03/24/2018 0557   COAGS Lab Results  Component Value Date   INR 1.12 03/22/2018   INR 1.06 10/06/2014   INR 0.96 06/08/2013   Lipid Panel    Component Value Date/Time   CHOL 169 03/22/2018 0012   TRIG 54  03/22/2018 0012   HDL 46 03/22/2018 0012   CHOLHDL 3.7 03/22/2018 0012   VLDL 11 03/22/2018 0012   LDLCALC 112 (H) 03/22/2018 0012   HgbA1C  Lab Results  Component Value Date   HGBA1C 5.4 03/22/2018   Urinalysis    Component Value Date/Time   COLORURINE YELLOW 03/21/2018 1539    APPEARANCEUR CLEAR 03/21/2018 1539   LABSPEC 1.030 03/21/2018 1539   PHURINE 8.0 03/21/2018 1539   GLUCOSEU NEGATIVE 03/21/2018 1539   HGBUR NEGATIVE 03/21/2018 1539   BILIRUBINUR NEGATIVE 03/21/2018 1539   KETONESUR 5 (A) 03/21/2018 1539   PROTEINUR NEGATIVE 03/21/2018 1539   UROBILINOGEN 0.2 10/13/2014 1640   NITRITE NEGATIVE 03/21/2018 1539   LEUKOCYTESUR NEGATIVE 03/21/2018 1539   Urine Drug Screen     Component Value Date/Time   LABOPIA NONE DETECTED 03/21/2018 1539   COCAINSCRNUR NONE DETECTED 03/21/2018 1539   LABBENZ NONE DETECTED 03/21/2018 1539   AMPHETMU NONE DETECTED 03/21/2018 1539   THCU NONE DETECTED 03/21/2018 1539   LABBARB NONE DETECTED 03/21/2018 1539    Alcohol Level    Component Value Date/Time   ETH <10 03/21/2018 1415     SIGNIFICANT DIAGNOSTIC STUDIES Ct Angio Head W Or Wo Contrast  Result Date: 03/21/2018 CLINICAL DATA:  72 year old female code stroke presentation with right facial droop and dizziness. EXAM: CT ANGIOGRAPHY HEAD AND NECK TECHNIQUE: Multidetector CT imaging of the head and neck was performed using the standard protocol during bolus administration of intravenous contrast. Multiplanar CT image reconstructions and MIPs were obtained to evaluate the vascular anatomy. Carotid stenosis measurements (when applicable) are obtained utilizing NASCET criteria, using the distal internal carotid diameter as the denominator. CONTRAST:  11mL ISOVUE-370 IOPAMIDOL (ISOVUE-370) INJECTION 76% COMPARISON:  Head CT without contrast 1422 hours today. CTA head and neck 10/07/2014. FINDINGS: CTA NECK Skeleton: No acute osseous abnormality identified. Chronic cervical ACDF. Upper chest: Stable apical lung scarring. No superior mediastinal lymphadenopathy. Stable small volume likely physiologic superior pericardial recess fluid. Partially visible left chest cardiac pacemaker. Other neck: Stable. Partial atrophy of the right submandibular gland. No neck mass or  lymphadenopathy. Aortic arch: Stable with mild tortuosity. Minimal atherosclerosis for age. Three vessel arch configuration. Right carotid system: Stable and negative aside from tortuous distal cervical right ICA. Left carotid system: Stable and negative aside from mild tortuosity. Vertebral arteries: No proximal right subclavian artery stenosis. Mild tortuosity and plaque. Normal right vertebral artery origin. Mildly tortuous right V1 segment. No right vertebral artery stenosis to the skull base. Normal proximal left subclavian artery and left vertebral artery origin. Mildly tortuous left V1 segment. Mild bilateral vertebral artery ectasia, the right is dominant. No left vertebral stenosis to the skull base. CTA HEAD Posterior circulation: Stable distal vertebral arteries without stenosis. Normal PICA origins. Normal vertebrobasilar junction. Patent basilar artery without stenosis. Normal SCA and PCA origins. Posterior communicating arteries are diminutive or absent. Bilateral PCA branches are stable and within normal limits. Anterior circulation: Both ICA siphons remain patent with no plaque or stenosis. Mild siphon tortuosity greater on the right. Normal ophthalmic artery origins. Patent carotid termini. Normal MCA and ACA origins. Diminutive or absent anterior communicating artery. Bilateral ACA branches are stable and within normal limits. Left MCA M1 segment, trifurcation and left MCA branches are stable and within normal limits. Right MCA M1 segment, bifurcation, and right MCA branches are stable and within normal limits. Venous sinuses: Patent. Anatomic variants: Mildly dominant right vertebral artery. Review of the MIP images confirms the above findings IMPRESSION:  1. No large vessel occlusion. Stable since 2016 and negative for age CTA Head and Neck. 2. These results were communicated to choose 1 at 2:56 pmon 11/8/2019by text page via the [facility] messaging system. Electronically Signed   By: Genevie Ann M.D.    On: 03/21/2018 14:57   Ct Head Wo Contrast  Result Date: 03/22/2018 CLINICAL DATA:  Sudden onset of dizziness. Continued surveillance. Concern for posterior circulation insult. EXAM: CT HEAD WITHOUT CONTRAST TECHNIQUE: Contiguous axial images were obtained from the base of the skull through the vertex without intravenous contrast. COMPARISON:  CTA head 12/22/2017. CT head 03/21/2018. MR head 09/12/2016. FINDINGS: Brain: No evidence for acute infarction, hemorrhage, mass lesion, hydrocephalus, or extra-axial fluid. Mild atrophy. Mild hypoattenuation of white matter, consistent with small vessel disease. Vascular: Calcification of the cavernous internal carotid arteries consistent with cerebrovascular atherosclerotic disease. No signs of intracranial large vessel occlusion. Skull: Calvarium intact. Sinuses/Orbits: No sinus or mastoid disease. Other: None. IMPRESSION: Unchanged CT head without contrast.  Chronic changes as described. No definite acute infarction is demonstrated. The patient had a pacemaker prior to the MR which was performed 09/12/2016. It is unclear if the current pacemaker is compatible with MR, but the previous device appears to have been. Electronically Signed   By: Staci Righter M.D.   On: 03/22/2018 18:01   Ct Angio Neck W Or Wo Contrast  Result Date: 03/21/2018 CLINICAL DATA:  72 year old female code stroke presentation with right facial droop and dizziness. EXAM: CT ANGIOGRAPHY HEAD AND NECK TECHNIQUE: Multidetector CT imaging of the head and neck was performed using the standard protocol during bolus administration of intravenous contrast. Multiplanar CT image reconstructions and MIPs were obtained to evaluate the vascular anatomy. Carotid stenosis measurements (when applicable) are obtained utilizing NASCET criteria, using the distal internal carotid diameter as the denominator. CONTRAST:  38mL ISOVUE-370 IOPAMIDOL (ISOVUE-370) INJECTION 76% COMPARISON:  Head CT without contrast 1422  hours today. CTA head and neck 10/07/2014. FINDINGS: CTA NECK Skeleton: No acute osseous abnormality identified. Chronic cervical ACDF. Upper chest: Stable apical lung scarring. No superior mediastinal lymphadenopathy. Stable small volume likely physiologic superior pericardial recess fluid. Partially visible left chest cardiac pacemaker. Other neck: Stable. Partial atrophy of the right submandibular gland. No neck mass or lymphadenopathy. Aortic arch: Stable with mild tortuosity. Minimal atherosclerosis for age. Three vessel arch configuration. Right carotid system: Stable and negative aside from tortuous distal cervical right ICA. Left carotid system: Stable and negative aside from mild tortuosity. Vertebral arteries: No proximal right subclavian artery stenosis. Mild tortuosity and plaque. Normal right vertebral artery origin. Mildly tortuous right V1 segment. No right vertebral artery stenosis to the skull base. Normal proximal left subclavian artery and left vertebral artery origin. Mildly tortuous left V1 segment. Mild bilateral vertebral artery ectasia, the right is dominant. No left vertebral stenosis to the skull base. CTA HEAD Posterior circulation: Stable distal vertebral arteries without stenosis. Normal PICA origins. Normal vertebrobasilar junction. Patent basilar artery without stenosis. Normal SCA and PCA origins. Posterior communicating arteries are diminutive or absent. Bilateral PCA branches are stable and within normal limits. Anterior circulation: Both ICA siphons remain patent with no plaque or stenosis. Mild siphon tortuosity greater on the right. Normal ophthalmic artery origins. Patent carotid termini. Normal MCA and ACA origins. Diminutive or absent anterior communicating artery. Bilateral ACA branches are stable and within normal limits. Left MCA M1 segment, trifurcation and left MCA branches are stable and within normal limits. Right MCA M1 segment, bifurcation, and right MCA  branches are  stable and within normal limits. Venous sinuses: Patent. Anatomic variants: Mildly dominant right vertebral artery. Review of the MIP images confirms the above findings IMPRESSION: 1. No large vessel occlusion. Stable since 2016 and negative for age CTA Head and Neck. 2. These results were communicated to choose 1 at 2:56 pmon 11/8/2019by text page via the [facility] messaging system. Electronically Signed   By: Genevie Ann M.D.   On: 03/21/2018 14:57   Mr Brain Wo Contrast  Result Date: 03/24/2018 CLINICAL DATA:  Vertigo presentation.  Dizziness.  Gait disturbance. EXAM: MRI HEAD WITHOUT CONTRAST TECHNIQUE: Multiplanar, multiecho pulse sequences of the brain and surrounding structures were obtained without intravenous contrast. COMPARISON:  Head CT 03/22/2018.  MRI 09/12/2016. FINDINGS: Brain: Diffusion imaging does not show any acute or subacute infarction. Minimal small vessel change of a chronic nature affects the pons. No focal cerebellar insult. Cerebral hemispheres show mild chronic small-vessel change of the deep and subcortical white matter. No cortical or large vessel territory infarction. No mass lesion, hemorrhage, hydrocephalus or extra-axial collection. Vascular: Major vessels at the base of the brain show flow. Skull and upper cervical spine: Negative Sinuses/Orbits: Clear/normal Other: None IMPRESSION: No acute or reversible finding. No specific cause of the clinical presentation is identified. Mild age related volume loss with mild chronic small-vessel change of the hemispheric white matter and minimal small vessel change of the pons. Electronically Signed   By: Nelson Chimes M.D.   On: 03/24/2018 11:56   Ct Head Code Stroke Wo Contrast  Result Date: 03/21/2018 CLINICAL DATA:  Code stroke. 72 year old female with right facial droop, dizziness. EXAM: CT HEAD WITHOUT CONTRAST TECHNIQUE: Contiguous axial images were obtained from the base of the skull through the vertex without intravenous contrast.  COMPARISON:  Brain MRI 09/12/2016, head CT 02/22/2016. FINDINGS: Brain: Stable cerebral volume. No midline shift, ventriculomegaly, mass effect, evidence of mass lesion, intracranial hemorrhage or evidence of cortically based acute infarction. Minimal to mild for age white matter hypodensity appears stable since 2017. Vascular: Mild Calcified atherosclerosis at the skull base. No suspicious intracranial vascular hyperdensity. Skull: Negative. Sinuses/Orbits: Paranasal sinuses and mastoids are stable and well pneumatized. Tympanic cavities remain clear. Other: Stable and negative orbit and scalp soft tissues. ASPECTS ([place] Stroke Program Early CT Score) - Ganglionic level infarction (caudate, lentiform nuclei, internal capsule, insula, M1-M3 cortex): 7 - Supraganglionic infarction (M4-M6 cortex): 3 Total score (0-10 with 10 being normal): 10 IMPRESSION: 1. Stable since 2017 and negative for age noncontrast head CT. 2. ASPECTS is 10. 3. These results were communicated to Dr. Rory Percy at 2:35 pmon 11/8/2019by text page via the [facility] messaging system. Electronically Signed   By: Genevie Ann M.D.   On: 03/21/2018 14:35    Transthoracic Echocardiogram  03/22/2018 - Left ventricle: The cavity size was normal. Wall thickness wasnormal. Systolic function was normal. The estimated ejectionfraction was in the range of 60% to 65%. Wall motion was normal;there were no regional wall motion abnormalities. Dopplerparameters are consistent with abnormal left ventricularrelaxation (grade 1 diastolic dysfunction). - Aortic valve: There was no stenosis. - Mitral valve: There was no significant regurgitation. - Right ventricle: The cavity size was normal. Pacer wire orcatheter noted in right ventricle. Systolic function was normal. - Tricuspid valve: Peak RV-RA gradient (S): 16 mm Hg. - Pulmonary arteries: PA peak pressure: 19 mm Hg (S). - Inferior vena cava: The vessel was normal in size. Therespirophasic diameter changes  were in the normal range (>= 50%),consistent with normal central  venous pressure. - Pericardium, extracardiac: A trivial pericardial effusion wasidentified. Impressions: Normal LV size with EF 60-65%. Normal RV size and systolicfunction. No significant valvular abnormalities.     HISTORY OF PRESENT ILLNESS JOHNNETTE LAUX an 72 y.o.femaleWith PMH significant for pacemaker, CNS neuropathy, right side breast cancer, hypothyroidism presented to [facility] via [facility] c/o of sudden dizziness. Code stroke was called.  She has no prior history of stroke. Per patient has had BPPV before and this Is different. She reports that she woke up this morning in her usual state of health. At 11:30 03/21/2018 ([facility]) had a sudden onset of dizziness. Feeling unsteady with walking. She says that the room is not spinning, but that objects are moving across her field of visionwhen she tried to move her head. Also reports that she has diplopia at baseline up to 6 ft but the diplopia is worse than it has ever been. The dizziness became worse about 12:30.She has in the past had some dizziness attributed to BPPV and at one point also told she has complex migraine. She insisted that this dizziness is completely different. Denies any falls or recent head trauma, any focal weakness. Endorses nausea and vomiting. She is extremely nauseous, no vomiting. No headache. Visual symptoms as above.  No fever, chills, SOB, CP, abd pain. No recent surgeries, not on anticoag, no bleeding diathesis.  CT of the head was negative for hemorrhage.  CT angios was negative for LVO. BP: 166/84 BG: 115.  Modified Rankin:Rankin Score=0. NIHSS:5. She was administered IV TPA and admitted to the neuro ICU.   HOSPITAL COURSE Ms. SENIA EVEN is a 72 y.o. female with history of pacemaker, BPPV, CNS neuropathy, autonomic dysfunction, right side breast cancer, hypothyroidism presenting with sudden dizziness.  She received IV TPA 03/21/2018 at 1431.   She tolerated it without difficulty.  MRI was negative for acute stroke, though with ongoing symptoms, it is thought she had a stroke too small to be seen on MRI.  Stroke:  small brainstem  infarct not seen on MRI status post IV TPA  Given ongoing symptoms, patient felt to have an infarct that is just too small to be seen on MRI   CT head - Stable since 2017 and negative for age noncontrast head CT.   CTA H&N - No large vessel occlusion.  MRI head - no acute infarct seen   2D Echo  - EF 60 - 65%. No cardiac source of emboli identified.   LDL - 112  HgbA1c - 5.4  No antithrombotic prior to admission, now on aspirin 81 mg daily. Given TIA, recommend aspirin 81 mg and plavix 75 mg daily x 3 weeks, then aspirin alone. Orders adjusted.   Therapy recommendations:  OP PT, no OT  Disposition:   Return home  Hyperlipidemia  Lipid lowering medication PTA:  none  LDL 112, goal < 70  Current lipid lowering medication - Lipitor 40 mg daily  Continue statin at discharge  Other Stroke Risk Factors  Advanced age  ETOH use, advised to drink no more than 1 alcoholic beverage per day.  History complicated migraine  Other Active Problems  Autonomic dysfunction  BPPV  Hypokalemia - supplement - recheck  History right-sided breast cancer   DISCHARGE EXAM per Dr. Leonie Man Blood pressure 136/79, pulse 65, temperature 97.9 F (36.6 C), temperature source Oral, resp. rate 18, height 5\' 7"  (1.702 m), weight 72.5 kg, SpO2 98 %. Awake, alert, fully oriented. Fluent, comprehension, naming, repetition - intact. Strength 5/5  BUE and BLE. Coord- intact FTN, HTS bilaterally. Sensory -intact. EOMI, PERL, no nystagmus.  Face symmetric. Tongue midline. Gait - deferred.    Discharge Diet   regular diet thin liquids  DISCHARGE PLAN  Disposition: Return home  Outpatient physical therapy  aspirin 81 mg daily and clopidogrel 75 mg daily for secondary stroke prevention x3 weeks then  aspirin alone  Ongoing risk factor control by Primary Care Physician at time of discharge  Follow-up Burnard Bunting, MD in 2 weeks.  Follow-up in [facility] in 4 weeks, office to schedule an appointment.   35 minutes were spent preparing discharge.  Burnetta Sabin, MSN, APRN, ANVP-BC, AGPCNP-BC Advanced Practice Stroke Nurse [place] for Schedule & Pager information 03/24/2018 4:53 PM   I have personally examined this patient, reviewed notes, independently viewed imaging studies, participated in medical decision making and plan of care.ROS completed by me personally and pertinent positives fully documented  I have made any additions or clarifications directly to the above note. Agree with note above.    Antony Contras, MD Medical Director [facility] Pager: [phone] 03/24/2018 6:25 PM

## 2018-03-24 NOTE — Progress Notes (Signed)
STROKE TEAM PROGRESS NOTE   HISTORY OF PRESENT ILLNESS (per record) [name] is an 72 y.o. female  With PMH significant for pacemaker, CNS neuropathy, right side breast cancer, hypothyroidism presented to [facility] via [facility] c/o of sudden dizziness. Code stroke was called.   No prior stroke history. Per patient has had BPPV before and this Is different. She reports that she woke up this morning in her usual state of health. At 11:30 had a sudden onset of dizziness. Feeling unsteady with walking. She says that the room is not spinning, but that objects are moving across her field of vision when she tried to move her head.  Also reports that she has diplopia at baseline up to 6 ft but the diplopia is worse than it has ever been.  The dizziness became worse about 12:30.  She has in the past had some dizziness attributed to BPPV and at one point also told she has complex migraine. She insisted that this dizziness is completely different. Denies any falls or recent head trauma, any focal weakness. Endorses nausea and vomiting.  She is extremely nauseous, no vomiting. No headache. Visual symptoms as above.  No fever, chills, SOB, CP, abd pain/. No recent surgeries, not on anticoag, no bleeding diathesis. ED course: seen in room by EDP-->code activated-->taken for [facility] and advanced imaging after seen by neurology right away (neurologist was in ER and saw the patient even before the page went out) [facility]: no hemorrhage. CTA: no LVO BP: 166/84 BG: 115  Date last known well: Date: 03/21/2018 Time last known well: Time: 11:30 tPA Given: Yes; bolus started @ 1431 Modified Rankin: Rankin Score=0 NIHSS: 5   SUBJECTIVE (INTERVAL HISTORY) She just returned from [facility] which I reviewed and was normal.she states that she had sudden onset of nausea, vomiting, dizziness with objects moving side to side rather than true vertigo which lasted to check him to the hospital. She was noted by EMS to have facial droop  and some slurred speech which was transient. She does have history of cholesteatoma and chronic tinnitus but denies true vertigo An episode of possible computed migraine a few years ago and saw Dr. Erlinda Hong. She has followed up with Dr. Floyde Parkins in office since then   OBJECTIVE Vitals:   03/23/18 1924 03/23/18 2348 03/24/18 0324 03/24/18 0820  BP: (!) 146/77 133/66 134/69 125/71  Pulse: 60 60 60 62  Resp: 18 18 18 17   Temp: 98 F (36.7 C) 98.3 F (36.8 C) 98.7 F (37.1 C) 99.1 F (37.3 C)  TempSrc: Oral Oral Oral Oral  SpO2: 96% 100% 94% 96%  Weight:      Height:        CBC:  Recent Labs  Lab 03/21/18 1415 03/21/18 1420  WBC 7.6  --   NEUTROABS 3.3  --   HGB 14.4 14.6  HCT 44.5 43.0  MCV 94.9  --   PLT 285  --     Basic Metabolic Panel:  Recent Labs  Lab 03/21/18 1415 03/21/18 1420 03/24/18 0557  NA 137 139 138  K 3.6 3.4* 4.0  CL 105 105 110  CO2 20*  --  22  GLUCOSE 116* 115* 81  BUN 12 11 10   CREATININE 0.80 0.70 0.71  CALCIUM 9.3  --  8.9    Lipid Panel:     Component Value Date/Time   CHOL 169 03/22/2018 0012   TRIG 54 03/22/2018 0012   HDL 46 03/22/2018 0012   CHOLHDL  3.7 03/22/2018 0012   VLDL 11 03/22/2018 0012   LDLCALC 112 (H) 03/22/2018 0012   HgbA1c:  Lab Results  Component Value Date   HGBA1C 5.4 03/22/2018   Urine Drug Screen:     Component Value Date/Time   LABOPIA NONE DETECTED 03/21/2018 1539   COCAINSCRNUR NONE DETECTED 03/21/2018 1539   LABBENZ NONE DETECTED 03/21/2018 1539   AMPHETMU NONE DETECTED 03/21/2018 1539   THCU NONE DETECTED 03/21/2018 1539   LABBARB NONE DETECTED 03/21/2018 1539    Alcohol Level     Component Value Date/Time   ETH <10 03/21/2018 1415    IMAGING  Ct Angio Head W Or Wo Contrast Ct Angio Neck W Or Wo Contrast 03/21/2018 IMPRESSION:  No large vessel occlusion. Stable since 2016 and negative for age CTA Head and Neck.    Ct Head Code Stroke Wo Contrast 03/21/2018 IMPRESSION:  1. Stable  since 2017 and negative for age noncontrast head CT.  2. ASPECTS is 10.   Ct Head Code Stroke Wo Contrast 03/22/2018 (18:01) IMPRESSION: Chronic changes as described. No definite acute infarction is demonstrated. The patient had a pacemaker prior to the MR which was performed 09/12/2016. It is unclear if the current pacemaker is compatible with MR, but the previous device appears to have been.   MRI Brain Wo Contrast - no acute infarct  Transthoracic Echocardiogram  03/22/2018 Study Conclusions - Left ventricle: The cavity size was normal. Wall thickness was   normal. Systolic function was normal. The estimated ejection   fraction was in the range of 60% to 65%. Wall motion was normal;   there were no regional wall motion abnormalities. Doppler   parameters are consistent with abnormal left ventricular   relaxation (grade 1 diastolic dysfunction). - Aortic valve: There was no stenosis. - Mitral valve: There was no significant regurgitation. - Right ventricle: The cavity size was normal. Pacer wire or   catheter noted in right ventricle. Systolic function was normal. - Tricuspid valve: Peak RV-RA gradient (S): 16 mm Hg. - Pulmonary arteries: PA peak pressure: 19 mm Hg (S). - Inferior vena cava: The vessel was normal in size. The   respirophasic diameter changes were in the normal range (>= 50%),   consistent with normal central venous pressure. - Pericardium, extracardiac: A trivial pericardial effusion was   identified. Impressions: - Normal LV size with EF 60-65%. Normal RV size and systolic   function. No significant valvular abnormalities.    PHYSICAL EXAM Blood pressure 125/71, pulse 62, temperature 99.1 F (37.3 C), temperature source Oral, resp. rate 17, height 5\' 7"  (1.702 m), weight 72.5 kg, SpO2 96 %.  Awake, alert, fully oriented. Fluent, comprehension, naming, repetition - intact. Strength 5/5 BUE and BLE. Coord- intact FTN, HTS bilaterally. Sensory  -intact. EOMI, PERL, no nystagmus.  Face symmetric. Tongue midline. Gait - deferred.       ASSESSMENT/PLAN [name] is a 72 y.o. female with history of pacemaker, BPPV, CNS neuropathy, autonomic dysfunction, right side breast cancer, hypothyroidism presenting with sudden dizziness.  tPA Given: Yes; bolus started 03/21/2018 @ 1431  Suspected small brainstem Stroke:  MRI negative  Resultant  Dizziness.  CT head - Stable since 2017 and negative for age noncontrast head CT.   MRI head - no acute infarct  MRA head - not performed  CTA H&N - No large vessel occlusion.  Carotid Doppler - CTA neck performed - carotid dopplers not indicated.  2D Echo  - EF 60 - 65%.  No cardiac source of emboli identified.   LDL - 112  HgbA1c - 5.4  VTE prophylaxis - SCDs  Diet - regular  No antithrombotic prior to admission, now on No antithrombotic S/P tPA  Patient counseled to be compliant with her antithrombotic medications  Ongoing aggressive stroke risk factor management  Therapy recommendations:  None Disposition:  none Hypertension  Stable . Permissive hypertension (OK if < 220/120) but gradually normalize in 5-7 days . Long-term BP goal normotensive  Hyperlipidemia  Lipid lowering medication PTA:  none  LDL 112, goal < 70  Current lipid lowering medication - Add Lipitor 40 mg daily  Continue statin at discharge   Other Stroke Risk Factors  Advanced age  ETOH use, advised to drink no more than 1 alcoholic beverage per day.   Other Active Problems  Autonomic dysfunction  BPPV  Hypokalemia - supplement - recheck     Plan  No evidence of stroke on MRI.  I may miss a small cerebellar infarct on CT,  Recommend aspirin and Plavix for 3 weeks followed by aspirin alone. Add Lipitor 40 mg for elevated lipids.  Discharge home later today. Long discussion with patient and family at the bedside and answered questions. Follow-up with Dr. Jannifer Franklin her  neurologist as an outpatient in 6 weeks Antony Contras, [facility] day # 3   To contact Stroke Continuity provider, please refer to [email or web address] After hours, contact General Neurology

## 2018-03-24 NOTE — Progress Notes (Signed)
PHARMACIST - PHYSICIAN COMMUNICATION DR:   Leonie Man CONCERNING: Protonix IV to Oral Route Change Policy  RECOMMENDATION: This patient is receiving Protonix by the intravenous route.  Based on criteria approved by the Pharmacy and Therapeutics Committee, this drug is being converted to the equivalent oral dose form(s).  DESCRIPTION: These criteria include:  The patient is eating (either orally or via tube) and/or has been taking other orally administered medications for a least 24 hours  There is no active GI bleed or impaired GI absorption noted.   If you have questions about this conversion, please contact the Pharmacy Department  []   [phone] )  [name] [x]   [phone] )  [name]  []   [phone] )  [facility] []   [phone] )  [place], PharmD, BCPS  3:17 PM

## 2018-03-25 NOTE — Care Management Important Message (Signed)
Important Message  Patient Details  Name: IMANII GOSDIN MRN: [id number] Date of Birth: 09/14/1945   Medicare Important Message Given:  No  Due to illness patient is not able to sign.  Unsigned copy left  Concettina Leth 03/25/2018, 8:51 AM

## 2018-03-26 NOTE — Consult Note (Signed)
            [facility] CM Primary Care Navigator  03/26/2018  [name] Jul 13, 1945 [id number]   Attempttoseepatient at the bedsideto identify possible discharge needs butshe was alreadydischarged per staff. Patient went home with outpatient PT, per therapy recommendation.   Per MD note, patient presented with sudden dizziness.  MRI was negative for acute stroke, though with ongoing symptoms, it is thought she had a stroke too small to be seen on MRI. (cerebral infarction brainstem status post IV tPA- not seen on MRI, complicated migraine, HLD- hyperlipidemia)  Primary care provider's officeis listed asproviding transition of care (TOC).  Patient hasdischarge instruction to follow-up primary care provider in 2 weeks and neurology follow-up in 4 weeks.   For additional questions please contact:  Edwena Felty A. Ebenezer Mccaskey, BSN, RN-BC [facility] PRIMARY CARE Navigator Cell: [phone]

## 2018-03-27 ENCOUNTER — Other Ambulatory Visit: Payer: Self-pay

## 2018-03-27 DIAGNOSIS — E038 Other specified hypothyroidism: Secondary | ICD-10-CM | POA: Diagnosis not present

## 2018-03-27 DIAGNOSIS — E7849 Other hyperlipidemia: Secondary | ICD-10-CM | POA: Diagnosis not present

## 2018-03-27 DIAGNOSIS — Z6824 Body mass index (BMI) 24.0-24.9, adult: Secondary | ICD-10-CM | POA: Diagnosis not present

## 2018-03-27 DIAGNOSIS — I639 Cerebral infarction, unspecified: Secondary | ICD-10-CM | POA: Diagnosis not present

## 2018-03-27 DIAGNOSIS — H8193 Unspecified disorder of vestibular function, bilateral: Secondary | ICD-10-CM | POA: Diagnosis not present

## 2018-03-27 LAB — CUP PACEART REMOTE DEVICE CHECK
Battery Voltage: 2.99 V
Brady Statistic AP VP Percent: 26.39 %
Brady Statistic AP VS Percent: 0 %
Brady Statistic AS VP Percent: 73.58 %
Brady Statistic AS VS Percent: 0.03 %
Brady Statistic RV Percent Paced: 99.95 %
Date Time Interrogation Session: 20191023144220
Implantable Lead Location: 753859
Implantable Lead Model: 5076
Implantable Pulse Generator Implant Date: 20140818
Lead Channel Impedance Value: 456 Ohm
Lead Channel Impedance Value: 646 Ohm
Lead Channel Pacing Threshold Amplitude: 0.625 V
Lead Channel Sensing Intrinsic Amplitude: 17.25 mV
Lead Channel Sensing Intrinsic Amplitude: 17.25 mV
Lead Channel Sensing Intrinsic Amplitude: 2.125 mV
Lead Channel Setting Pacing Amplitude: 2.5 V
Lead Channel Setting Pacing Pulse Width: 0.4 ms
Lead Channel Setting Sensing Sensitivity: 0.9 mV
MDC IDC LEAD IMPLANT DT: 20140818
MDC IDC LEAD IMPLANT DT: 20140818
MDC IDC LEAD LOCATION: 753860
MDC IDC MSMT BATTERY REMAINING LONGEVITY: 55 mo
MDC IDC MSMT LEADCHNL RA IMPEDANCE VALUE: 323 Ohm
MDC IDC MSMT LEADCHNL RA PACING THRESHOLD AMPLITUDE: 1 V
MDC IDC MSMT LEADCHNL RA PACING THRESHOLD PULSEWIDTH: 0.4 ms
MDC IDC MSMT LEADCHNL RA SENSING INTR AMPL: 2.125 mV
MDC IDC MSMT LEADCHNL RV IMPEDANCE VALUE: 608 Ohm
MDC IDC MSMT LEADCHNL RV PACING THRESHOLD PULSEWIDTH: 0.4 ms
MDC IDC SET LEADCHNL RA PACING AMPLITUDE: 2.25 V
MDC IDC STAT BRADY RA PERCENT PACED: 26.38 %

## 2018-03-27 NOTE — Patient Outreach (Signed)
[facility] [facility]) Care Management  03/27/2018  PELAGIA Reynolds [birth year] [id number]   EMMI- Stroke RED ON EMMI ALERT Day # 1 Date: 03/27/18 Red Alert Reason: Questions/problems with meds? Yes  Incoming call from patient.  She is able to verify HIPAA.  She states that she is doing well and just got back from seeing her PCP.  Discussed red alert. She states that she was questioning her medication Plavix as she has had some watery diarrhea.  She states that she discussed it with Dr. [name] this morning and she is continuing to take due to recent stroke but was advised to take medications with food as it can cause some stomach upset.  Discussed with patient foods that would also help with diarrhea.  Advised patient that she will continue to get automated phone calls and if there was some question in her answer a nurse would call.  She verbalized understanding and declines any needs.   Plan: RN CM will close case.    Jone Baseman, RN, MSN [place] Management Care Management Coordinator Direct Line [phone] Cell [phone] Toll Free: [phone]  Fax: [phone]

## 2018-03-27 NOTE — Patient Outreach (Signed)
[place] [facility]) Care Management  03/27/2018  ALVIRA HECHT 1945-06-12 [id number]   EMMI- Stroke RED ON EMMI ALERT Day # 1 Date: 03/27/18  Red Alert Reason:  Questions/problems with meds? Yes  Outreach attempt: No answer.  HIPAA compliant voice message left.     Plan: RN CM will attempt again within 4 business days and send a letter.  Jone Baseman, RN, MSN [facility] Care Management Care Management Coordinator Direct Line [phone] Toll Free: [phone]  Fax: [phone]

## 2018-03-28 ENCOUNTER — Telehealth: Payer: Self-pay | Admitting: Rehabilitative and Restorative Service Providers"

## 2018-03-28 NOTE — Telephone Encounter (Addendum)
-----   Message from Rexanne Mano, PT sent at 03/24/2018 10:55 PM EST ----- [place],  I'm thinking you will remember this lady... She has very complicated history and I think was seen at OP in 2017. 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   She asked that I ask your opinion on whether she should come to OPPT (acute PT had recommended, she was asking specifically for you)  Short version, she had severe vertigo with N/v, had tPA and symptoms reversed.   I told her I doubted she needed OP and she wanted your opinion. I told her you or I would call her back.  Jeani Hawking  PT called patient at 3:40pm on 03/28/18 and left message.  Amina Menchaca, PT   Patient called back today 03/31/18:  She notes she is having "vestibular issues" further described as some balance issues, spatial difficulties when turning her head.    She is trying to determine if she should return to our clinic for evaluation.  She notes overall her symptoms returned to baseline after being treated with TPA.   "It's not any worse than when you released me the last time."    She inquired about referral to neuro-ophthalmology-- PT recommended she speak to neurology at her f/u visit.  Damel Querry, PT

## 2018-03-31 ENCOUNTER — Other Ambulatory Visit: Payer: Self-pay | Admitting: Allergy and Immunology

## 2018-04-03 ENCOUNTER — Other Ambulatory Visit: Payer: Self-pay | Admitting: Internal Medicine

## 2018-04-04 DIAGNOSIS — D692 Other nonthrombocytopenic purpura: Secondary | ICD-10-CM | POA: Diagnosis not present

## 2018-04-04 DIAGNOSIS — L821 Other seborrheic keratosis: Secondary | ICD-10-CM | POA: Diagnosis not present

## 2018-04-04 DIAGNOSIS — D1801 Hemangioma of skin and subcutaneous tissue: Secondary | ICD-10-CM | POA: Diagnosis not present

## 2018-05-05 ENCOUNTER — Telehealth: Payer: Self-pay | Admitting: Adult Health

## 2018-05-05 ENCOUNTER — Encounter: Payer: Self-pay | Admitting: Adult Health

## 2018-05-05 ENCOUNTER — Ambulatory Visit (INDEPENDENT_AMBULATORY_CARE_PROVIDER_SITE_OTHER): Payer: Medicare Other | Admitting: Adult Health

## 2018-05-05 VITALS — BP 118/71 | HR 59 | Ht 67.0 in | Wt 162.0 lb

## 2018-05-05 DIAGNOSIS — H532 Diplopia: Secondary | ICD-10-CM

## 2018-05-05 DIAGNOSIS — R2689 Other abnormalities of gait and mobility: Secondary | ICD-10-CM | POA: Diagnosis not present

## 2018-05-05 DIAGNOSIS — R0609 Other forms of dyspnea: Secondary | ICD-10-CM

## 2018-05-05 DIAGNOSIS — I951 Orthostatic hypotension: Secondary | ICD-10-CM

## 2018-05-05 DIAGNOSIS — R29818 Other symptoms and signs involving the nervous system: Secondary | ICD-10-CM | POA: Diagnosis not present

## 2018-05-05 DIAGNOSIS — R0600 Dyspnea, unspecified: Secondary | ICD-10-CM

## 2018-05-05 DIAGNOSIS — G459 Transient cerebral ischemic attack, unspecified: Secondary | ICD-10-CM | POA: Diagnosis not present

## 2018-05-05 DIAGNOSIS — E785 Hyperlipidemia, unspecified: Secondary | ICD-10-CM | POA: Diagnosis not present

## 2018-05-05 MED ORDER — ROSUVASTATIN CALCIUM 10 MG PO TABS: 10.0000 mg | ORAL_TABLET | Freq: Every day | ORAL | 3 refills | Status: DC

## 2018-05-05 NOTE — Telephone Encounter (Signed)
Spoke with the patient, she has been feeling some SOB when exerting, followed by chest tightness. She denies chest pain. She also has noticed that she also has had heart palpitations that are different then before. The pain is more on the left side of her chest. Patient had trivial pericardial effusion. If the patient coughs she has a rapid thumping in her chest that occurs that feels different then her palpitations in the past. Patient had a follow up with neuro and they advised an appointment with Dr. Caryl Comes. The patient has been like this since the hospitalization on 11/08. Sending to Dr. Caryl Comes.

## 2018-05-05 NOTE — Patient Instructions (Addendum)
Continue aspirin 81 mg daily  for secondary stroke prevention  Stop lipitor and start Crestor 10mg  - we will check cholesterol panel in 4-6 weeks - please call office if you are unable to tolerate  Hypotension: 6-10g of sodium per day - look at nutrition labels. If you have hypotension episodes, increase fluid and eat something high in sodium  We will do an repeat echocardiogram due to worsening symptoms - please call cardiology for follow up appointment due to worsening symptoms   You will be called by physical therapy due to continued dizziness  You will be called by neuro-ophtho   Continue to follow up with PCP regarding cholesterol and blood pressure management   Continue to monitor blood pressure at home  Maintain strict control of hypertension with blood pressure goal below 130/90, diabetes with hemoglobin A1c goal below 6.5% and cholesterol with LDL cholesterol (bad cholesterol) goal below 70 mg/dL. I also advised the patient to eat a healthy diet with plenty of whole grains, cereals, fruits and vegetables, exercise regularly and maintain ideal body weight.  Followup in the future with me in 3 months or call earlier if needed       Thank you for coming to see [place] at [facility]. I hope we have been able to provide you high quality care today.  You may receive a patient satisfaction survey over the next few weeks. We would appreciate your feedback and comments so that we may continue to improve ourselves and the health of our patients.

## 2018-05-05 NOTE — Telephone Encounter (Signed)
Medicare/tricare order sent to GI. No auth they will reach out to the pt to schedule.  °

## 2018-05-05 NOTE — Progress Notes (Addendum)
[facility] [address] [place]. [place] [address] [phone]       OFFICE FOLLOW UP NOTE  [name] Date of Birth:  [birth year] Medical Record Number:  [id number]   Reason for Referral:  hospital stroke follow up  CHIEF COMPLAINT:  Chief Complaint  Patient presents with  . Follow-up    Hospital Stroke pt seen by Dr. Leonie Man, follow up room 9 patient with BEn pt ses MD at [facility] for syncope issues every 6 months, only takes lipitor on sunday per PCP    HPI: [name] is being seen today for initial visit in the office for small brainstem infarct to small to be seen on MRI s/p IV TPA versus TIA on 03/21/2018. History obtained from patient and chart review. Reviewed all radiology images and labs personally.  Courtney Centner Sternbergis an 72 y.o.femaleWith PMH significant for pacemaker, CNS neuropathy, right side breast cancer, hypothyroidism and BPPV who presented to [facility] with sudden onset dizziness and diplopia.  Due to continued deficits, she received IV TPA without complication.  CTA head negative for acute infarct.  CTA head and neck negative for large vessel occlusion.  MRI brain reviewed and was negative for acute infarct but due to her ongoing symptoms it was felt as though her infarct may be too small to see on imaging.  2D echo showed an EF of 60 to 65% without cardiac source of embolus identified.  LDL 112 and A1c 5.4.  Patient was not on antithrombotic PTA and recommended DAPT for 3 weeks and aspirin alone.  Also recommended to initiate atorvastatin 40 mg daily for HLD management.  Patient was discharged home in stable condition with recommendations of outpatient PT.   Patient is being seen today for hospital follow-up and is accompanied by her husband.  She does continue to have dizziness/vertigo-like symptoms which may possibly still be worsened from her baseline.  She did contact physical therapy for possible need of evaluation as she has  seen them in the past but it was recommended by physical therapy to follow-up in this office first.  She also continues to complain of diplopia which is a chronic complaint.  She was seen by neuro-ophthalmology in the past but has not seen them again since her concerns of worsening over the past year.  She denies continued worsening since hospital admission.  She has completed 3 weeks DAPT therapy with mild bruising but no abnormal bleeding and currently on aspirin 81 mg alone and is tolerating this well.  She does continue to take Lipitor but due to possible statin myalgias, this was recommended to start taking once weekly on Sunday by her PCP.  She does state that her myalgias resolved after decreasing atorvastatin to once weekly.  Due to continued LDL elevations, it was recommended to start on Crestor 10 mg daily and recommended repeat lipid panel in 4 to 6 weeks.  Blood pressure today satisfactory 118/71.  Patient also has complaints of dyspnea with exertion along with patient being concerned of hospital echocardiogram showing fluid around her heart. She also notes intermittent palpitations. Echocardiogram did note to have a trivial pericardial effusion with most recent echo in 2016 did not mention this.  She has not been followed by her cardiologist since hospital discharge. She does have a history of orthostatic hypotension and is concerned as she has been told to intake adequate amount of sodium but feels as though she may be consuming to much. 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 No further concerns at this time. Denies new or worsening stroke/TIA symptoms.     ROS:   14 system review of systems performed and negative with exception of palpitations, hearing loss, ringing in ears, double vision, shortness of breath, incontinence, easy bruising, joint pain, aching muscles, allergies, skin sensitivity, memory loss  and dizziness  PMH:  Past Medical History:  Diagnosis Date  . ADHD (attention deficit hyperactivity disorder)   . Allergy    trees/pollen, mold, fungus, dust mites. Takes allergy shots  . Arthritis    PAIN AND OA LEFT HIP  . Asthma    allergist Dr Olena Heckle- monthly allergy injections  . Autonomic dysfunction    CENTRAL NERVOUS SYSTEM NEUROPATHY - DX BY DR. Erling Cruz MORE THAN 10 YRS AGO - AND IT IS FELT TO CONTRIBUTE TO THE AUTOMIC  DYSFUNCTION-- PT HAS NUMBNESS LEGS AND FEET AND SOMETIIMES TIPS OF FINGER, SEVERE CONSTIPATION( NO SENSATION TO HAVE BM ), DOUBLE VISION, ORTHOSTATIC HYPOTENSION  . Blood transfusion   . Breast cancer ([place])    right side  . Bruises easily   . Cataract   . Chest pain    a. 12/2012 Cath: nl cors, EF 55-65%.  . Complication of anesthesia    reaction to some anesthetics/ 7/12 anesth record on chart- states prefers epidural  . Depression   . Diverticulosis   . Dysrhythmia    HX OF HIGH GRADE HEART BLOCK - REQUIRED PACEMAKER INSERTION  . Esophageal stricture   . Gastritis   . GERD (gastroesophageal reflux disease)   . H/O hiatal hernia   . Hemorrhoids   . Hernia of abdominal wall    spigelian hernia RLQ - SURGERY TO REPAIR  . Hypothyroidism   . Interstitial cystitis   . Latex allergy, contact dermatitis   . Mallory - Weiss tear    HEALED   . Neuromuscular disorder (HCC)    central nervous system neuropathy- seen per Dr Erling Cruz  . Pacemaker   . Pericardial effusion    a. 12/2012 following ppm placement;  b. 01/01/2013 Echo: EF 55-60%, small pericardial effusion w/o RV collapse-->No need for tap/window.  Marland Kitchen PONV (postoperative nausea and vomiting)    pt needs scop patch  . Recurrent upper respiratory infection (URI) 1/13- to present   bronchitis following surgery- states improved but still with cough. OV with [facility] Dr [name] 09/06/11 on chart  . Shortness of breath    AT TIMES - BUT MUCH IMPROVED AFTER PACEMAKER WAS REPROGRAMED.  Marland Kitchen Symptomatic bradycardia      a. 12/2012 s/p MDT dual chamber PPM, ser # [id number]; b. 12/2012 post-op course complicated by pericardial effusinon req lead revision.  . Thyroid disease     PSH:  Past Surgical History:  Procedure Laterality Date  . ABDOMINAL HYSTERECTOMY  1974  . BACK SURGERY     cervical fusion 4-5 with plate  . BLADDER SUSPENSION    . BREAST BIOPSY  2002   NO BLOOD PRESSURES ON RIGHT SIDE/   s/p  axillary node dissection  . CYSTO WITH HYDRODISTENSION  09/07/2011   Procedure: CYSTOSCOPY/HYDRODISTENSION;  Surgeon: Ailene Rud, MD;  Location: [facility];  Service: Urology;  Laterality: N/A;  INSTILLATION OF MARCAINE/PYRIDIUM INSTILLATION OF MARCAINE/KENALOG  . CYSTOSCOPY  1975, 2007  . EYE SURGERY     LASIK EYE SURGERY BILATERAL  . LAPAROSCOPY  1973  . LEAD  REVISION N/A 12/30/2012   Procedure: LEAD REVISION;  Surgeon: Evans Lance, MD;  Location[facility] CATH LAB;  Service: Cardiovascular;  Laterality: N/A;  . LEFT HEART CATHETERIZATION WITH CORONARY ANGIOGRAM N/A 12/29/2012   Procedure: LEFT HEART CATHETERIZATION WITH CORONARY ANGIOGRAM;  Surgeon: Peter M Martinique, MD;  Location: [facility] CATH LAB;  Service: Cardiovascular;  Laterality: N/A;  . MASTECTOMY MODIFIED RADICAL     right; with immediate reconstruction  . MYRINGOPLASTY  1962  . NECK SURGERY     c4-5 ruptured disc  . OOPHORECTOMY  1982  . PACEMAKER INSERTION    . PERMANENT PACEMAKER INSERTION N/A 12/29/2012   Procedure: PERMANENT PACEMAKER INSERTION;  Surgeon: Deboraha Sprang, MD;  Location: [facility] CATH LAB;  Service: Cardiovascular;  Laterality: N/A;  . RECTOCELE REPAIR     with cystocele repair  . TONSILLECTOMY    . TOTAL HIP ARTHROPLASTY Right   . TOTAL HIP ARTHROPLASTY Left 06/12/2013   Procedure: LEFT TOTAL HIP ARTHROPLASTY ANTERIOR APPROACH;  Surgeon: Mcarthur Rossetti, MD;  Location: [facility];  Service: Orthopedics;  Laterality: Left;  . TYMPANOPLASTY  1973   right  . ULNAR NERVE TRANSPOSITION Right   . VENTRAL HERNIA REPAIR   05/30/2011   Procedure: HERNIA REPAIR VENTRAL ADULT;  Surgeon: Haywood Lasso, MD;  Location: [facility];  Service: General;  Laterality: Right;  repair right spigelian hernia    Social History:  Social History   Socioeconomic History  . Marital status: Married    Spouse name: Not on file  . Number of children: 2  . Years of education: 45  . Highest education level: Not on file  Occupational History  . Occupation: Retired    Fish farm manager: [place]: [employer]    Employer: [employer]  . Financial resource strain: Not on file  . Food insecurity:    Worry: Not on file    Inability: Not on file  . Transportation needs:    Medical: Not on file    Non-medical: Not on file  Tobacco Use  . Smoking status: Never Smoker  . Smokeless tobacco: Never Used  Substance and Sexual Activity  . Alcohol use: Yes    Alcohol/week: 7.0 standard drinks    Types: 7 Glasses of wine per week    Comment: occasional  . Drug use: No  . Sexual activity: Not on file  Lifestyle  . Physical activity:    Days per week: Not on file    Minutes per session: Not on file  . Stress: Not on file  Relationships  . Social connections:    Talks on phone: Not on file    Gets together: Not on file    Attends religious service: Not on file    Active member of club or organization: Not on file    Attends meetings of clubs or organizations: Not on file    Relationship status: Not on file  . Intimate partner violence:    Fear of current or ex partner: Not on file    Emotionally abused: Not on file    Physically abused: Not on file    Forced sexual activity: Not on file  Other Topics Concern  . Not on file  Social History Narrative   Lives at home w/ her husband   Patient drinks 1-2 cup of caffeine daily.   Patient is right handed.    Family History:  Family History  Problem Relation Age of Onset  . Heart disease  Father   . Colon cancer Mother 78  . Depression Mother   . Pancreatic cancer Sister 8  . Diverticulitis Sister   . Uterine cancer Maternal Grandmother   . Heart disease Brother   . Heart disease Paternal Grandmother   . Heart disease Paternal Grandfather   . Throat cancer Maternal Uncle   . Heart disease Paternal Aunt   . Heart disease Paternal Uncle   . Heart disease Brother   . Thrombosis Maternal Aunt   . Cancer Maternal Uncle        NOS  . Diabetes Other        aunt    Medications:   Current Outpatient Medications on File Prior to Visit  Medication Sig Dispense Refill  . albuterol (PROVENTIL HFA;VENTOLIN HFA) 108 (90 BASE) MCG/ACT inhaler Inhale 2 puffs into the lungs every 6 (six) hours as needed. For shortness of breath    . aspirin EC 81 MG EC tablet Take 1 tablet (81 mg total) by mouth daily.    Marland Kitchen atorvastatin (LIPITOR) 40 MG tablet Take 1 tablet (40 mg total) by mouth daily at 6 PM. 30 tablet 2  . b complex vitamins capsule Take 1 capsule by mouth daily.     . cetirizine (ZYRTEC) 10 MG tablet Take 10 mg by mouth 2 (two) times daily.     . cholecalciferol (VITAMIN D) 1000 UNITS tablet Take 1,000 Units by mouth daily.    . cycloSPORINE (RESTASIS) 0.05 % ophthalmic emulsion Place 1 drop into both eyes 2 (two) times daily as needed (unk).     Marland Kitchen escitalopram (LEXAPRO) 10 MG tablet Take 10 mg by mouth daily.    Marland Kitchen estradiol (ESTRING) 2 MG vaginal ring Place 2 mg vaginally every 3 (three) months. follow package directions    . estradiol (VIVELLE-DOT) 0.025 MG/24HR Place 1 patch onto the skin 2 (two) times a week. Applies new patch on Sunday & Thursday    . fluocinonide (LIDEX) 0.05 % external solution 1 APPLICATION ON THE SKIN AS DIRECTED. APPLY TO SCALP BID FOR 1-2 WEEKS PRN FLARE AS DIRECTED  1  . fluticasone (FLONASE) 50 MCG/ACT nasal spray Place 1 spray into both nostrils daily.    Marland Kitchen levothyroxine (SYNTHROID, LEVOTHROID) 100 MCG tablet Take 1 tablet by mouth daily.    .  Magnesium 500 MG CAPS Take 1 capsule by mouth daily.    . meclizine (ANTIVERT) 25 MG tablet Take 0.5 tablets (12.5 mg total) by mouth 3 (three) times daily. (Patient taking differently: Take 12.5 mg by mouth 3 (three) times daily as needed for dizziness. ) 45 tablet 1  . Melatonin 1 MG TABS Take 1.5 tablets by mouth at bedtime as needed.     . midodrine (PROAMATINE) 5 MG tablet Take 1 tablet by mouth every 3 (three) hours. Takes 4-5 times daily from waking until no later than 7 pm.    . montelukast (SINGULAIR) 10 MG tablet TAKE 1 TABLET DAILY 90 tablet 2  . potassium chloride (KLOR-CON M10) 10 MEQ tablet Take 1 tablet (10 mEq total) by mouth daily. 90 tablet 3  . Probiotic Product (ALIGN) 4 MG CAPS Take 4 mg by mouth at bedtime.     . RABEprazole (ACIPHEX) 20 MG tablet Take 20 mg by mouth daily.   11  . vitamin C (ASCORBIC ACID) 500 MG tablet Take 500 mg by mouth daily.      Current  Facility-Administered Medications on File Prior to Visit  Medication Dose Route Frequency Provider Last Rate Last Dose  . bupivacaine (MARCAINE) 0.5 % 10 mL, triamcinolone acetonide (KENALOG-40) 40 mg injection   Subcutaneous Once Carolan Clines, MD      . bupivacaine (MARCAINE) 0.5 % 15 mL, phenazopyridine (PYRIDIUM) 400 mg bladder mixture   Bladder Instillation Once Carolan Clines, MD        Allergies:   Allergies  Allergen Reactions  . Anesthetics, Amide Other (See Comments)    Patient unsure of names, however multiples cause swelling of airway & nausea  . Clindamycin Swelling  . Neomycin Swelling  . Shellfish Allergy Other (See Comments)    Neurotoxic reaction  . Sulfonamide Derivatives Anaphylaxis and Swelling  . Zolpidem Tartrate Other (See Comments)    Jerking motions   . Azithromycin Other (See Comments)    Severe gastritis   . Bactroban Other (See Comments)    Causes sores in nose  . Ciprofloxacin     REACTION: joint swelling  . Codeine Swelling  . Meperidine Hcl Nausea Only     Hallucinations   . Morphine Nausea Only    Hallucinations   . Neosporin [Neomycin-Polymyxin-Gramicidin] Hives and Dermatitis    All topical "orin's ointment"  . Nitrofurantoin     REACTION: neuropathy in legs  . Other     ALLERGIC TO WARM WATER SHELLFISH  PT STATES PLEASE NOTE SHE CAN TAKE OXYCODONE AND TYLENOL FOR PAIN -NOT ALLERGIC  . Penicillins Other (See Comments)    Swelling in joints  . Tramadol     Makes jerk  . Latex Rash     Physical Exam  Vitals:   05/05/18 1309  BP: 118/71  Pulse: (!) 59  Weight: 162 lb (73.5 kg)  Height: 5\' 7"  (1.702 m)   Body mass index is 25.37 kg/m. No exam data present  General: well developed, well nourished, pleasant elderly Caucasian female, seated, in no evident distress Head: head normocephalic and atraumatic.   Neck: supple with no carotid or supraclavicular bruits Cardiovascular: regular rate and rhythm, no murmurs Musculoskeletal: no deformity Skin:  no rash/petichiae Vascular:  Normal pulses all extremities  Neurologic Exam Mental Status: Awake and fully alert. Oriented to place and time. Recent and remote memory intact. Attention span, concentration and fund of knowledge appropriate. Mood and affect appropriate.  Cranial Nerves: Fundoscopic exam reveals sharp disc margins. Pupils equal, briskly reactive to light. Extraocular movements full without nystagmus. Visual fields full to confrontation with subjective blurriness. Hearing intact. Facial sensation intact. Face, tongue, palate moves normally and symmetrically.  Motor: Normal bulk and tone. LUE: weakness in grip strength noted Sensory.: intact to touch , pinprick , position and vibratory sensation.  Coordination: Decreased left hand finger dexterity.  Orbits right arm over left arm.  Ataxia noted in bilateral upper extremities with finger-to-nose testing Gait and Station: Arises from chair without difficulty. Stance is normal. Gait demonstrates balance difficulties with  frequently grabbing onto the walls for assistance.  Romberg negative. Reflexes: 1+ and symmetric. Toes downgoing.    NIHSS  0 Modified Rankin  1    Diagnostic Data (Labs, Imaging, Testing)  CT HEAD WO CONTRAST 03/21/2018 IMPRESSION: 1. Stable since 2017 and negative for age noncontrast head CT. 2. ASPECTS is 10.  CT ANGIO HEAD W OR WO CONTRAST CT ANGIO NECK W OR WO CONTRAST 03/21/2018 IMPRESSION: 1. No large vessel occlusion. Stable since 2016 and negative for age CTA Head and Neck.  MR BRAIN WO CONTRAST  03/24/2018 IMPRESSION: No acute or reversible finding. No specific cause of the clinical presentation is identified. Mild age related volume loss with mild chronic small-vessel change of the hemispheric white matter and minimal small vessel change of the pons.     ASSESSMENT: TIAH HECKEL is a 72 y.o. year old female here with right brainstem infarct to small to be seen on MRI s/p IV TPA versus TIA on 03/21/2018. Vascular risk factors include pacemaker, CNS neuropathy, right-sided breast cancer, BPPV, possible complicated migraine, HLD and HTN.  Patient being seen today for hospital follow-up and does continue to have ongoing dizziness and diplopia which are both chronic complaints for patient with possible worsening over the past year.  Patient does state improvement of her symptoms compared to presentation in the hospital and possibly near baseline dizziness and diplopia.   PLAN:  1. Right brainstem infarct versus TIA: Continue aspirin 81 mg daily  and discontinue atorvastatin 40 mg daily and start Crestor 10 mg daily due to possible statin myalgias for secondary stroke prevention. Maintain strict control of hypertension with blood pressure goal below 130/90, diabetes with hemoglobin A1c goal below 6.5% and cholesterol with LDL cholesterol (bad cholesterol) goal below 70 mg/dL.  I also advised the patient to eat a healthy diet with plenty of whole grains, cereals,  fruits and vegetables, exercise regularly with at least 30 minutes of continuous activity daily and maintain ideal body weight. 2. Possible new left upper extremity weakness: Weakness had not been noted on prior provider visits or during admission.  Patient unaware of this weakness.  Order placed for CT head to ensure no new infarct 3. Dizziness: Referral placed for PT due to ongoing dizziness concerns 4. Diplopia: Referral placed to neuro-ophthalmology due to continued diplopia 5. HTN: Advised to continue current treatment regimen.  Today's BP 118/71.  Advised to continue to monitor at home along with continued follow-up with PCP for management 6. HLD: Advised to continue current treatment regimen along with continued follow-up with PCP for future prescribing and monitoring of lipid panel.  As patient does not have follow-up with PCP until 10/2018, it was recommended to obtain lipid panel in 6 weeks time to ensure adequate HLD management 7. Advised patient to schedule appointment with cardiology but due to worsening concerns of dyspnea on exertion, repeat 2D echo to ensure no worsening of pericardial effusion.  We will reach out to cardiologist to ensure he agrees with this plan and should update him with these worsening complaints.  Unable to determine if her symptoms are related to underlying cardiac condition versus possible deconditioning after her recent hospital admission   Follow up in 3 months or call earlier if needed   Greater than 50% of time during this 25 minute visit was spent on counseling, explanation of diagnosis of right brainstem infarct versus TIA, reviewing risk factor management of HTN, and HLD, planning of further management along with potential future management, and discussion with patient and family answering all questions.    Venancio Poisson, AGNP-BC  [facility] [address] [place], [place] [address]  Phone [phone] Fax  [phone] Note: This document was prepared with digital dictation and possible smart phrase technology. Any transcriptional errors that result from this process are unintentional.

## 2018-05-05 NOTE — Addendum Note (Signed)
Addended by: Venancio Poisson on: 05/05/2018 04:27 PM   Modules accepted: Orders

## 2018-05-06 ENCOUNTER — Telehealth: Payer: Self-pay | Admitting: *Deleted

## 2018-05-06 NOTE — Telephone Encounter (Signed)
-----   Message from Deboraha Sprang, MD sent at 05/05/2018  4:34 PM EST ----- Can we get transmission to see if she is having AF

## 2018-05-06 NOTE — Telephone Encounter (Signed)
Spoke with patient, requested transmission. She agrees to send now. Advised I will call back after it is received.

## 2018-05-06 NOTE — Telephone Encounter (Signed)
[name]  Can u plz arrange for Zio patch for next week if she is still having symptoms

## 2018-05-06 NOTE — Telephone Encounter (Signed)
Dr. Caryl Comes reviewed transmission. AF burden <0.1%, no recent episodes. He responded to patient via MyChart with recommendations.  Also updated patient via phone with recommendations. Patient is aware to call our office or send MyChart message later this week if symptoms persist and she wishes to pursue two week monitor. She denies additional questions or concerns at this time and thanked me for my call.

## 2018-05-09 ENCOUNTER — Ambulatory Visit
Admission: RE | Admit: 2018-05-09 | Discharge: 2018-05-09 | Disposition: A | Discharge status: Home / Self Care | Payer: Medicare Other | Source: Ambulatory Visit | Attending: Adult Health | Admitting: Adult Health

## 2018-05-09 DIAGNOSIS — R29818 Other symptoms and signs involving the nervous system: Secondary | ICD-10-CM | POA: Diagnosis not present

## 2018-05-13 ENCOUNTER — Ambulatory Visit (HOSPITAL_COMMUNITY)
Admission: RE | Admit: 2018-05-13 | Discharge: 2018-05-13 | Disposition: A | Discharge status: Home / Self Care | Payer: Medicare Other | Source: Ambulatory Visit | Attending: Adult Health | Admitting: Adult Health

## 2018-05-13 DIAGNOSIS — Z8673 Personal history of transient ischemic attack (TIA), and cerebral infarction without residual deficits: Secondary | ICD-10-CM | POA: Diagnosis not present

## 2018-05-13 DIAGNOSIS — I071 Rheumatic tricuspid insufficiency: Secondary | ICD-10-CM | POA: Diagnosis not present

## 2018-05-13 DIAGNOSIS — Z853 Personal history of malignant neoplasm of breast: Secondary | ICD-10-CM | POA: Insufficient documentation

## 2018-05-13 DIAGNOSIS — R0609 Other forms of dyspnea: Secondary | ICD-10-CM | POA: Diagnosis not present

## 2018-05-13 DIAGNOSIS — R0600 Dyspnea, unspecified: Secondary | ICD-10-CM

## 2018-05-13 DIAGNOSIS — R4182 Altered mental status, unspecified: Secondary | ICD-10-CM | POA: Diagnosis not present

## 2018-05-13 DIAGNOSIS — I441 Atrioventricular block, second degree: Secondary | ICD-10-CM | POA: Insufficient documentation

## 2018-05-13 NOTE — Progress Notes (Signed)
  Echocardiogram 2D Echocardiogram has been performed.  [name] 05/13/2018, 11:03 AM

## 2018-05-15 ENCOUNTER — Encounter: Payer: Self-pay | Admitting: Physical Therapy

## 2018-05-15 ENCOUNTER — Ambulatory Visit: Payer: Medicare Other | Attending: Adult Health | Admitting: Physical Therapy

## 2018-05-15 ENCOUNTER — Telehealth: Payer: Self-pay

## 2018-05-15 DIAGNOSIS — R42 Dizziness and giddiness: Secondary | ICD-10-CM | POA: Diagnosis not present

## 2018-05-15 DIAGNOSIS — M6281 Muscle weakness (generalized): Secondary | ICD-10-CM | POA: Diagnosis not present

## 2018-05-15 DIAGNOSIS — R2689 Other abnormalities of gait and mobility: Secondary | ICD-10-CM

## 2018-05-15 NOTE — Telephone Encounter (Signed)
Rn call patient about echocardiogram results. Rn stated it does show the echocardiogram continues to show pericardial effusion. The results were sent to a cardiologist and its recommend to follow-up with her cardiologist Dr. Caryl Comes with any continued symptoms of dyspnea or if she experiences worsening of dyspnea with exertion, orthopnea, chest pain or chest fullness. Pt verbalized understanding,and will call [facility] office for an appt. ------

## 2018-05-15 NOTE — Telephone Encounter (Signed)
-----   Message from Courtney Poisson, NP sent at 05/15/2018  7:36 AM EST ----- Please advise patient that her recent echocardiogram did continue to show pericardial effusion.  These results will be sent to a cardiologist and advised her to follow-up with her cardiologist Dr. Caryl Comes with any continued symptoms of dyspnea or if she experiences worsening of dyspnea with exertion, orthopnea, chest pain or chest fullness.

## 2018-05-15 NOTE — Therapy (Signed)
[facility] [address] [place], [place], [address] Phone: [phone]   Fax:  [phone]  Physical Therapy Evaluation  Patient Details  Name: [name] MRN: [id number] Date of Birth: [birth year] Referring Provider (PT): Tanja Port NP   Encounter Date: 05/15/2018  PT End of Session - 05/15/18 1333    Visit Number  1    Number of Visits  12    Date for PT Re-Evaluation  06/26/18       Past Medical History:  Diagnosis Date  . ADHD (attention deficit hyperactivity disorder)   . Allergy    trees/pollen, mold, fungus, dust mites. Takes allergy shots  . Arthritis    PAIN AND OA LEFT HIP  . Asthma    allergist Dr Olena Heckle- monthly allergy injections  . Autonomic dysfunction    CENTRAL NERVOUS SYSTEM NEUROPATHY - DX BY DR. Erling Cruz MORE THAN 10 YRS AGO - AND IT IS FELT TO CONTRIBUTE TO THE AUTOMIC  DYSFUNCTION-- PT HAS NUMBNESS LEGS AND FEET AND SOMETIIMES TIPS OF FINGER, SEVERE CONSTIPATION( NO SENSATION TO HAVE BM ), DOUBLE VISION, ORTHOSTATIC HYPOTENSION  . Blood transfusion   . Breast cancer ([facility])    right side  . Bruises easily   . Cataract   . Chest pain    a. 12/2012 Cath: nl cors, EF 55-65%.  . Complication of anesthesia    reaction to some anesthetics/ 7/12 anesth record on chart- states prefers epidural  . Depression   . Diverticulosis   . Dysrhythmia    HX OF HIGH GRADE HEART BLOCK - REQUIRED PACEMAKER INSERTION  . Esophageal stricture   . Gastritis   . GERD (gastroesophageal reflux disease)   . H/O hiatal hernia   . Hemorrhoids   . Hernia of abdominal wall    spigelian hernia RLQ - SURGERY TO REPAIR  . Hypothyroidism   . Interstitial cystitis   . Latex allergy, contact dermatitis   . Mallory - Weiss tear    HEALED   . Neuromuscular disorder (HCC)    central nervous system neuropathy- seen per Dr Erling Cruz  . Pacemaker   . Pericardial effusion    a. 12/2012 following ppm placement;  b.  01/01/2013 Echo: EF 55-60%, small pericardial effusion w/o RV collapse-->No need for tap/window.  Marland Kitchen PONV (postoperative nausea and vomiting)    pt needs scop patch  . Recurrent upper respiratory infection (URI) 1/13- to present   bronchitis following surgery- states improved but still with cough. OV with [facility] Dr [name] 09/06/11 on chart  . Shortness of breath    AT TIMES - BUT MUCH IMPROVED AFTER PACEMAKER WAS REPROGRAMED.  Marland Kitchen Symptomatic bradycardia    a. 12/2012 s/p MDT dual chamber PPM, ser # [id number]; b. 12/2012 post-op course complicated by pericardial effusinon req lead revision.  . Thyroid disease     Past Surgical History:  Procedure Laterality Date  . ABDOMINAL HYSTERECTOMY  1974  . BACK SURGERY     cervical fusion 4-5 with plate  . BLADDER SUSPENSION    . BREAST BIOPSY  2002   NO BLOOD PRESSURES ON RIGHT SIDE/   s/p  axillary node dissection  . CYSTO WITH HYDRODISTENSION  09/07/2011   Procedure: CYSTOSCOPY/HYDRODISTENSION;  Surgeon: Ailene Rud, MD;  Location: [facility];  Service: Urology;  Laterality: N/A;  INSTILLATION OF MARCAINE/PYRIDIUM INSTILLATION OF MARCAINE/KENALOG  . CYSTOSCOPY  1975, 2007  . EYE SURGERY     LASIK EYE SURGERY BILATERAL  . LAPAROSCOPY  [place] N/A 12/30/2012   Procedure: LEAD REVISION;  Surgeon: Evans Lance, MD;  Location: [facility] CATH LAB;  Service: Cardiovascular;  Laterality: N/A;  . LEFT HEART CATHETERIZATION WITH CORONARY ANGIOGRAM N/A 12/29/2012   Procedure: LEFT HEART CATHETERIZATION WITH CORONARY ANGIOGRAM;  Surgeon: Peter M Martinique, MD;  Location: [facility] CATH LAB;  Service: Cardiovascular;  Laterality: N/A;  . MASTECTOMY MODIFIED RADICAL     right; with immediate reconstruction  . MYRINGOPLASTY  1962  . NECK SURGERY     c4-5 ruptured disc  . OOPHORECTOMY  1982  . PACEMAKER INSERTION    . PERMANENT PACEMAKER INSERTION N/A 12/29/2012   Procedure: PERMANENT PACEMAKER INSERTION;  Surgeon: Deboraha Sprang, MD;  Location[facility]  CATH LAB;  Service: Cardiovascular;  Laterality: N/A;  . RECTOCELE REPAIR     with cystocele repair  . TONSILLECTOMY    . TOTAL HIP ARTHROPLASTY Right   . TOTAL HIP ARTHROPLASTY Left 06/12/2013   Procedure: LEFT TOTAL HIP ARTHROPLASTY ANTERIOR APPROACH;  Surgeon: Mcarthur Rossetti, MD;  Location: [facility];  Service: Orthopedics;  Laterality: Left;  . TYMPANOPLASTY  1973   right  . ULNAR NERVE TRANSPOSITION Right   . VENTRAL HERNIA REPAIR  05/30/2011   Procedure: HERNIA REPAIR VENTRAL ADULT;  Surgeon: Haywood Lasso, MD;  Location: [facility];  Service: General;  Laterality: Right;  repair right spigelian hernia    There were no vitals filed for this visit.   Subjective Assessment - 05/15/18 1106    Subjective  Pt relays mini stroke just before thanksgiving. She now is having more trouble with her gait, balance, and dizziness, along with heart palpatations (She does have pacemaker, she is being followed by cardiologist). She relays she gets dizzy walking in busy enviornments or if she turns her head fast. She does still have double vision and has been recommended in past to see opthalmologist for this in the past    Patient is accompained by:  Family member   Husband   Pertinent History  PMH: TIA, pacemaker, cerv fusion 4-5,Rt and LT THA, CNS neruopathy, Rt side breast CA,BPPV, orthostatic hypotension,DOE,HLD,HTN    Limitations  Standing;Walking    How long can you stand comfortably?  not limited standing    How long can you walk comfortably?  depends on if she is turning, or busy enviornmnet    Diagnostic tests  Head CT and MRI show no new acute infacts but some "degenerative changes and brainstem infarct vs TIA"    Patient Stated Goals  get back to baseline of walking up to 3 miles 3 times per week, improve balance and dizziness    Currently in Pain?  No/denies         [facility] PT Assessment - 05/15/18 0001      Assessment   Medical Diagnosis  balance (Rt  brainstem infarct), dizziness    Referring Provider (PT)  Tanja Port NP    Onset Date/Surgical Date  03/21/18    Next MD Visit  6 weeks    Prior Therapy  neuro PT in past for vestibular       Precautions   Precautions  Fall    Precaution Comments  based on balance tests today, she was recommended to use AD for now, she has cane and RW at home she reports      Balance Screen   Has the patient fallen in the past 6 months  No  Home Environment   Living Environment  Private residence    Additional Comments  2 steps to enter, inside is 2 stories, can go up/down stairs but has to hold onto handrail and gets SOB      Prior Function   Level of Independence  Independent    Vocation  Retired    Leisure  bridge, Control and instrumentation engineer, walking       Cognition   Overall Cognitive Status  Within Functional Limits for tasks assessed   does relay some trouble with word finding     Sensation   Light Touch  Appears Intact      Coordination   Gross Motor Movements are Fluid and Coordinated  No    Coordination and Movement Description  ataxia on Lt    Finger Nose Finger Test  decreased on Lt    Heel Shin Test  decreased on Lt      ROM / Strength   AROM / PROM / Strength  AROM;Strength      AROM   Overall AROM   Within functional limits for tasks performed      Strength   Overall Strength Comments  Rt UE 5/5, Lt UE 4 to 4+, Rt LE 4 to 4+, Lt LE 4-/5       Special Tests   Other special tests  no nystagmus with smooth pursuits but rebound nystagmus present with saccades and + head impulse test, (at baseline reports deficits with conversion and double vision)      Transfers   Transfers  Independent with all Transfers   increased time needed     Ambulation/Gait   Gait Comments  slow and unsteady gait with increased Lt knee hyperextension, dizziness and nausea if turns head or busy enviornment      Balance   Balance Assessed  Yes      Standardized Balance Assessment   Standardized  Balance Assessment  Berg Balance Test;Dynamic Gait Index;Timed Up and Go Test;Five Times Sit to Stand    Five times sit to stand comments   13.3      Berg Balance Test   Sit to Stand  Able to stand without using hands and stabilize independently    Standing Unsupported  Able to stand 2 minutes with supervision    Sitting with Back Unsupported but Feet Supported on Floor or Stool  Able to sit safely and securely 2 minutes    Stand to Sit  Sits safely with minimal use of hands    Transfers  Able to transfer safely, minor use of hands    Standing Unsupported with Eyes Closed  Able to stand 10 seconds with supervision    Standing Ubsupported with Feet Together  Able to place feet together independently and stand for 1 minute with supervision    From Standing, Reach Forward with Outstretched Arm  Can reach forward >12 cm safely (5")    From Standing Position, Pick up Object from Floor  Able to pick up shoe, needs supervision    From Standing Position, Turn to Look Behind Over each Shoulder  Turn sideways only but maintains balance   dizziness turning left   Turn 360 Degrees  Needs close supervision or verbal cueing   dizziness and unsteady turning to left   Standing Unsupported, Alternately Place Feet on Step/Stool  Able to stand independently and complete 8 steps >20 seconds    Standing Unsupported, One Foot in [employer] balance while stepping or standing    Standing  on One Leg  Unable to try or needs assist to prevent fall    Total Score  37      Dynamic Gait Index   Level Surface  Mild Impairment    Change in Gait Speed  Mild Impairment    Gait with Horizontal Head Turns  Severe Impairment    Gait with Vertical Head Turns  Severe Impairment    Gait and Pivot Turn  Severe Impairment    Step Over Obstacle  Severe Impairment    Step Around Obstacles  Moderate Impairment    Steps  Moderate Impairment    Total Score  6    DGI comment:  pt became very dizzy and nauseous with walking with  headturns and pivot turn causing her to remain very unseady for remainder of testing                Objective measurements completed on examination: See above findings.              PT Education - 05/15/18 1332    Education Details  HEP,POC, reach out to neurologist for opthalmologist referral    Person(s) Educated  Patient    Methods  Explanation;Demonstration;Verbal cues;Handout    Comprehension  Verbalized understanding;Need further instruction          PT Long Term Goals - 05/15/18 1344      PT LONG TERM GOAL #1   Title  Pt will be IND In HEP to improve dizziness, balance, and gait. TARGET DATE FOR ALL LTGS: 06/25/17    Time  6    Period  Days    Status  New      PT LONG TERM GOAL #2   Title  Pt will improve DGI to at least 19 and BERG to at least 46 to show improved balance and decreased risk for falls.     Time  6    Period  Weeks    Status  New      PT LONG TERM GOAL #3   Title  Pt will report average dizziness will be </=3/10 in order to perform ADLs in safe manner.     Time  6    Period  Weeks    Status  New      PT LONG TERM GOAL #4   Title  Pt will amb. 1000' over even/uneven terrain, IND, while performing head turns without dizziness incr. >/=1 point in order to improve functional mobility and safely garden.     Time  6    Period  Weeks    Status  New      PT LONG TERM GOAL #5   Title  Pt will improve LE strength to at least 4+/5 MMT bilat to improve function.     Time  6    Period  Weeks    Status  New             Plan - 05/15/18 1202    Clinical Impression Statement  Pt presents with gait and balance deficitis along with dizziness and nausea following suspected Rt brainstem infarct vs TIA which hospitalized her on Nov 8th 2019. Her balance tests place her at a high risk for falling and she has + vestibular testing today. PT has recommended she see vestibular specialist on site next PT visit for further vestibular evaluation as  she becomes very dizzy with turning her head or body to the left and with quick convergence. She has ataxia on  her Lt side with Lt sided weakness, difficulty walking, balance deficits, and dizziness affecting her functional abilities. She will benefit from skilled PT to address her deficits. She was also recommended to ask her neurologist for opthalmology referral.      History and Personal Factors relevant to plan of care:  PMH: TIA, pacemaker, cerv fusion 4-5,Rt and LT THA, CNS neruopathy, Rt side breast CA,BPPV, orthostatic hypotension,DOE,HLD,HTN    Clinical Presentation  Unstable    Clinical Presentation due to:  very unstable, dizzy, and nausea with turning head left for turning her body to the left, ataxic movements at times, co morbidites, visual defecits, cardiac involvement    Clinical Decision Making  High    Rehab Potential  Good    Clinical Impairments Affecting Rehab Potential  co morbidities, unstable symtpoms    PT Frequency  2x / week    PT Duration  6 weeks    PT Treatment/Interventions  Canalith Repostioning;Electrical Stimulation;Moist Heat;Gait training;Stair training;Functional mobility training;Therapeutic activities;Therapeutic exercise;Balance training;Neuromuscular re-education;Manual techniques;Passive range of motion;Energy conservation;Taping;Vestibular    PT Next Visit Plan  vestibular eval, gait, balance, Lt LE strength, has had fusion so no cervical mobs/manip or traction    PT Home Exercise Plan  [email or web address]    Recommended Other Services  she was recommeded to F/U with neurologist for referral for opthalmologist,     Consulted and Agree with Plan of Care  Patient       Patient will benefit from skilled therapeutic intervention in order to improve the following deficits and impairments:  Abnormal gait, Decreased activity tolerance, Cardiopulmonary status limiting activity, Decreased balance, Decreased coordination, Decreased endurance, Decreased safety awareness,  Decreased strength, Difficulty walking, Postural dysfunction  Visit Diagnosis: Other abnormalities of gait and mobility  Dizziness and giddiness  Muscle weakness (generalized)     Problem List Patient Active Problem List   Diagnosis Date Noted  . Chronic right shoulder pain 02/13/2017  . Short Achilles tendon (acquired), left ankle 02/13/2017  . Dizziness and giddiness 02/16/2016  . Monoallelic mutation of CHEK2 gene 02/02/2015  . Genetic testing 01/31/2015  . Altered mental status 10/25/2014  . Occipital neuralgia of left side   . Cerebral infarction ([place]) brainstem s/p IV tPA (not seen on MRI) 10/07/2014  . History of permanent cardiac pacemaker placement 10/07/2014  . Cephalalgia   . Complicated migraine   . HLD (hyperlipidemia)   . Headache 10/06/2014  . Arthritis of left hip 06/12/2013  . Status post THR (total hip replacement) 06/12/2013  . Constipation 05/25/2013  . Orthostatic hypotension 01/27/2013  . crosstalk-atrial lead 01/27/2013  . Pre-syncope 01/02/2013  . Sinus tachycardia 01/01/2013  . Second degree heart block 12/27/2012  . Hypothyroidism 12/27/2012  . Spigelian hernia 04/20/2011  . DYSPNEA 05/09/2010  . BREAST CANCER 05/05/2010  . ASTHMA 05/05/2010  . HIATAL HERNIA 05/05/2010  . DEGENERATIVE Disk DISEASE 05/05/2010  . OSTEOPENIA 05/05/2010    Silvestre Mesi 05/15/2018, 1:48 PM  [facility] [address] [place], [place], [address] Phone: [phone]   Fax:  [phone]  Name: [name] MRN: [id number] Date of Birth: 02-Apr-1946

## 2018-05-15 NOTE — Patient Instructions (Signed)
Access Code: [email or web address]  URL: [email or web address]  Date: 05/15/2018  Prepared by: Elsie Ra   Exercises  Sit to Stand with Armchair - 10 reps - 1-2 sets - 2x daily - 6x weekly  Side Stepping with Counter Support - 3-5 reps - 1 sets - 2x daily - 6x weekly  Standing Tandem Balance with Counter Support - 3 reps - 1 sets - 30 hold - 2x daily - 6x weekly  Standing Single Leg Stance with Unilateral Counter Support - 3 reps - 1 sets - 30 hold - 2x daily - 6x weekly  Seated Gaze Stabilization with Head Rotation - 1 reps - 2 sets - 30 hold - 2x daily - 6x weekly

## 2018-05-18 NOTE — Progress Notes (Signed)
I agree with the above plan 

## 2018-05-29 ENCOUNTER — Encounter: Payer: Self-pay | Admitting: Rehabilitative and Restorative Service Providers"

## 2018-05-29 ENCOUNTER — Ambulatory Visit: Payer: Medicare Other | Admitting: Rehabilitative and Restorative Service Providers"

## 2018-05-29 DIAGNOSIS — M6281 Muscle weakness (generalized): Secondary | ICD-10-CM | POA: Diagnosis not present

## 2018-05-29 DIAGNOSIS — R42 Dizziness and giddiness: Secondary | ICD-10-CM

## 2018-05-29 DIAGNOSIS — R2689 Other abnormalities of gait and mobility: Secondary | ICD-10-CM | POA: Diagnosis not present

## 2018-05-29 NOTE — Therapy (Addendum)
[facility] [address] [place], [place], [address] Phone: [phone]   Fax:  [phone]  Physical Therapy Treatment  Patient Details  Name: [name] MRN: [id number] Date of Birth: 17-Mar-1946 Referring Provider (PT): Tanja Port NP   Encounter Date: 05/29/2018  PT End of Session - 05/29/18 0908    Visit Number  2    Number of Visits  12    Date for PT Re-Evaluation  06/26/18    PT Start Time  6546    PT Stop Time  0934    PT Time Calculation (min)  40 min    Activity Tolerance  Other (comment);Patient tolerated treatment well   some limitation due to nausea   Behavior During Therapy  [facility] for tasks assessed/performed       Past Medical History:  Diagnosis Date  . ADHD (attention deficit hyperactivity disorder)   . Allergy    trees/pollen, mold, fungus, dust mites. Takes allergy shots  . Arthritis    PAIN AND OA LEFT HIP  . Asthma    allergist Dr Olena Heckle- monthly allergy injections  . Autonomic dysfunction    CENTRAL NERVOUS SYSTEM NEUROPATHY - DX BY DR. Erling Cruz MORE THAN 10 YRS AGO - AND IT IS FELT TO CONTRIBUTE TO THE AUTOMIC  DYSFUNCTION-- PT HAS NUMBNESS LEGS AND FEET AND SOMETIIMES TIPS OF FINGER, SEVERE CONSTIPATION( NO SENSATION TO HAVE BM ), DOUBLE VISION, ORTHOSTATIC HYPOTENSION  . Blood transfusion   . Breast cancer ([place])    right side  . Bruises easily   . Cataract   . Chest pain    a. 12/2012 Cath: nl cors, EF 55-65%.  . Complication of anesthesia    reaction to some anesthetics/ 7/12 anesth record on chart- states prefers epidural  . Depression   . Diverticulosis   . Dysrhythmia    HX OF HIGH GRADE HEART BLOCK - REQUIRED PACEMAKER INSERTION  . Esophageal stricture   . Gastritis   . GERD (gastroesophageal reflux disease)   . H/O hiatal hernia   . Hemorrhoids   . Hernia of abdominal wall    spigelian hernia RLQ - SURGERY TO REPAIR  . Hypothyroidism   . Interstitial  cystitis   . Latex allergy, contact dermatitis   . Mallory - Weiss tear    HEALED   . Neuromuscular disorder (HCC)    central nervous system neuropathy- seen per Dr Erling Cruz  . Pacemaker   . Pericardial effusion    a. 12/2012 following ppm placement;  b. 01/01/2013 Echo: EF 55-60%, small pericardial effusion w/o RV collapse-->No need for tap/window.  Marland Kitchen PONV (postoperative nausea and vomiting)    pt needs scop patch  . Recurrent upper respiratory infection (URI) 1/13- to present   bronchitis following surgery- states improved but still with cough. OV with Clearance Dr [name] 09/06/11 on chart  . Shortness of breath    AT TIMES - BUT MUCH IMPROVED AFTER PACEMAKER WAS REPROGRAMED.  Marland Kitchen Symptomatic bradycardia    a. 12/2012 s/p MDT dual chamber PPM, ser # [id number]; b. 12/2012 post-op course complicated by pericardial effusinon req lead revision.  . Thyroid disease     Past Surgical History:  Procedure Laterality Date  . ABDOMINAL HYSTERECTOMY  1974  . BACK SURGERY     cervical fusion 4-5 with plate  . BLADDER SUSPENSION    . BREAST BIOPSY  2002   NO BLOOD PRESSURES ON RIGHT SIDE/   s/p  axillary node dissection  .  CYSTO WITH HYDRODISTENSION  09/07/2011   Procedure: CYSTOSCOPY/HYDRODISTENSION;  Surgeon: Ailene Rud, MD;  Location: [facility];  Service: Urology;  Laterality: N/A;  INSTILLATION OF MARCAINE/PYRIDIUM INSTILLATION OF MARCAINE/KENALOG  . CYSTOSCOPY  1975, 2007  . EYE SURGERY     LASIK EYE SURGERY BILATERAL  . LAPAROSCOPY  1973  . LEAD REVISION N/A 12/30/2012   Procedure: LEAD REVISION;  Surgeon: Evans Lance, MD;  Location: [facility] CATH LAB;  Service: Cardiovascular;  Laterality: N/A;  . LEFT HEART CATHETERIZATION WITH CORONARY ANGIOGRAM N/A 12/29/2012   Procedure: LEFT HEART CATHETERIZATION WITH CORONARY ANGIOGRAM;  Surgeon: Peter M Martinique, MD;  Location: [facility] CATH LAB;  Service: Cardiovascular;  Laterality: N/A;  . MASTECTOMY MODIFIED RADICAL     right; with immediate  reconstruction  . MYRINGOPLASTY  1962  . NECK SURGERY     c4-5 ruptured disc  . OOPHORECTOMY  1982  . PACEMAKER INSERTION    . PERMANENT PACEMAKER INSERTION N/A 12/29/2012   Procedure: PERMANENT PACEMAKER INSERTION;  Surgeon: Deboraha Sprang, MD;  Location: [facility] CATH LAB;  Service: Cardiovascular;  Laterality: N/A;  . RECTOCELE REPAIR     with cystocele repair  . TONSILLECTOMY    . TOTAL HIP ARTHROPLASTY Right   . TOTAL HIP ARTHROPLASTY Left 06/12/2013   Procedure: LEFT TOTAL HIP ARTHROPLASTY ANTERIOR APPROACH;  Surgeon: Mcarthur Rossetti, MD;  Location: [facility];  Service: Orthopedics;  Laterality: Left;  . TYMPANOPLASTY  1973   right  . ULNAR NERVE TRANSPOSITION Right   . VENTRAL HERNIA REPAIR  05/30/2011   Procedure: HERNIA REPAIR VENTRAL ADULT;  Surgeon: Haywood Lasso, MD;  Location: [facility];  Service: General;  Laterality: Right;  repair right spigelian hernia    There were no vitals filed for this visit.  Subjective Assessment - 05/29/18 0855    Subjective  The patient notes that things are the same this week.  "I've tried to work on exercise and they make me dizzy and nauseous."  She notes she is taking it slow.    She notes visual issues at onset different than anything she has experienced before.  Since then her eyes have felt "wonky".    She notes she saw a neuroopthalmologist years ago at [facility].      Pertinent History  PMH: TIA, pacemaker, cerv fusion 4-5,Rt and LT THA, CNS neruopathy, Rt side breast CA,BPPV, orthostatic hypotension,DOE,HLD,HTN    Patient Stated Goals  get back to baseline of walking up to 3 miles 3 times per week, improve balance and dizziness    Currently in Pain?  No/denies             Vestibular Assessment - 05/29/18 0901      Vestibular Assessment   General Observation  "I always have double vision out to 5 or 6 feet and then it's not double."                 [facility] Adult PT Treatment/Exercise - 05/29/18 0914       Ambulation/Gait   Ambulation/Gait  Yes    Ambulation/Gait Assistance  6: Modified independent (Device/Increase time)    Ambulation Distance (Feet)  400 Feet    Assistive device  None    Gait Pattern  Within Functional Limits   occasional small steps, shuffling   Ambulation Surface  Level;Indoor    Gait Comments  The patient has some variability with gait pattern occasionally taking small, shuffling steps, then able to go back to a  normal gait pattern.        Self-Care   Self-Care  Other Self-Care Comments    Other Self-Care Comments   Discussed increasing walking at home and performing exercises frequently throughout the day.        Neuro Re-ed    Neuro Re-ed Details   Standing tandem stance and single leg stance.        Vestibular Treatment/Exercise - 05/29/18 0902      Vestibular Treatment/Exercise   Vestibular Treatment Provided  Gaze;Habituation    Habituation Exercises  Standing Vertical Head Turns;Standing Horizontal Head Turns    Gaze Exercises  X1 Viewing Horizontal;X1 Viewing Vertical      Standing Horizontal Head Turns   Number of Reps   5    Symptom Description   Not as provoking as vertical head turns.  Able to tolerate 5 reps with minimal symptoms.      Standing Vertical Head Turns   Number of Reps   3    Symptom Description   Patient notes 4/10 nausea and visual movement like things are moving.        X1 Viewing Horizontal   Foot Position  seated with letter 6 feet away    Comments  x 10 reps reportin 3/10 nausea and some difficulty  maintaining gaze on target      X1 Viewing Vertical   Foot Position  seated with letter 6 feet away.    Comments  seated x 10 reps -- able to do at a faster pace than horizontal            PT Education - 05/29/18 1157    Education Details  updated HEP    Person(s) Educated  Patient    Methods  Explanation;Demonstration;Handout    Comprehension  Returned demonstration;Verbalized understanding          PT Long  Term Goals - 05/15/18 1344      PT LONG TERM GOAL #1   Title  Pt will be IND In HEP to improve dizziness, balance, and gait. TARGET DATE FOR ALL LTGS: 06/25/18    Time  6    Period  Days    Status  New      PT LONG TERM GOAL #2   Title  Pt will improve DGI to at least 19 and BERG to at least 46 to show improved balance and decreased risk for falls.     Time  6    Period  Weeks    Status  New      PT LONG TERM GOAL #3   Title  Pt will report average dizziness will be </=3/10 in order to perform ADLs in safe manner.     Time  6    Period  Weeks    Status  New      PT LONG TERM GOAL #4   Title  Pt will amb. 1000' over even/uneven terrain, IND, while performing head turns without dizziness incr. >/=1 point in order to improve functional mobility and safely garden.     Time  6    Period  Weeks    Status  New      PT LONG TERM GOAL #5   Title  Pt will improve LE strength to at least 4+/5 MMT bilat to improve function.     Time  6    Period  Weeks    Status  New           Plan: PT  established HEP today to emphasize improved mobility and balance.   Continue working to [employer] progressing functional mobility.   PT Treatment/Interventions  Canalith Repostioning;Electrical Stimulation;Moist Heat;Gait training;Stair training;Functional mobility training;Therapeutic activities;Therapeutic exercise;Balance training;Neuromuscular re-education;Manual techniques;Passive range of motion;Energy conservation;Taping;Vestibular     PT Next Visit Plan  vestibular eval, gait, balance, Lt LE strength, has had fusion so no cervical mobs/manip or traction    PT Home Exercise Plan  [email or web address]         Patient will benefit from skilled therapeutic intervention in order to improve the following deficits and impairments:     Visit Diagnosis: Other abnormalities of gait and mobility  Dizziness and giddiness  Muscle weakness (generalized)     Problem List Patient Active Problem List    Diagnosis Date Noted  . Chronic right shoulder pain 02/13/2017  . Short Achilles tendon (acquired), left ankle 02/13/2017  . Dizziness and giddiness 02/16/2016  . Monoallelic mutation of CHEK2 gene 02/02/2015  . Genetic testing 01/31/2015  . Altered mental status 10/25/2014  . Occipital neuralgia of left side   . Cerebral infarction ([place]) brainstem s/p IV tPA (not seen on MRI) 10/07/2014  . History of permanent cardiac pacemaker placement 10/07/2014  . Cephalalgia   . Complicated migraine   . HLD (hyperlipidemia)   . Headache 10/06/2014  . Arthritis of left hip 06/12/2013  . Status post THR (total hip replacement) 06/12/2013  . Constipation 05/25/2013  . Orthostatic hypotension 01/27/2013  . crosstalk-atrial lead 01/27/2013  . Pre-syncope 01/02/2013  . Sinus tachycardia 01/01/2013  . Second degree heart block 12/27/2012  . Hypothyroidism 12/27/2012  . Spigelian hernia 04/20/2011  . DYSPNEA 05/09/2010  . BREAST CANCER 05/05/2010  . ASTHMA 05/05/2010  . HIATAL HERNIA 05/05/2010  . DEGENERATIVE Disk DISEASE 05/05/2010  . OSTEOPENIA 05/05/2010    Schuyler Olden, PT 05/29/2018, 11:57 AM  [facility] [address] [place], [place], [address] Phone: [phone]   Fax:  [phone]  Name: CASSIDI MODESITT MRN: [id number] Date of Birth: [birth year]

## 2018-05-29 NOTE — Patient Instructions (Signed)
Access Code: [email or web address]  URL: [email or web address]  Date: 05/29/2018  Prepared by: Rudell Cobb   Program Notes  Walking: 4 minutes at a time, 3 times per day. For exercises: don't allow symptoms to go beyond a 4/10. Stop and rest in between exercises to let dizziness, nausea, or lightheadedness settle.        Exercises Seated Gaze Stabilization with Head Rotation - 1 reps - 2 sets - 30 seconds hold - 2-3x daily - 6x weekly Seated Gaze Stabilization with Head Nod - 1 reps - 2 sets - 30 seconds hold - 2-3x daily - 6x weekly Romberg Stance with Head Nods - 3 reps - 1 sets - 2x daily - 7x weekly Romberg Stance with Head Rotation - 5 reps - 1 sets - 2x daily - 7x weekly Standing Tandem Balance with Counter Support - 3 reps - 1 sets - 30 hold - 1x daily - 6x weekly Sit to Stand with Armchair - 10 reps - 1 sets - 1x daily - 6x weekly Standing Single Leg Stance with Unilateral Counter Support - 3 reps - 1 sets - 30 hold - 1x daily - 6x weekly

## 2018-06-04 ENCOUNTER — Ambulatory Visit (INDEPENDENT_AMBULATORY_CARE_PROVIDER_SITE_OTHER): Payer: Medicare Other

## 2018-06-04 ENCOUNTER — Ambulatory Visit: Payer: Medicare Other | Admitting: Rehabilitative and Restorative Service Providers"

## 2018-06-04 ENCOUNTER — Encounter: Payer: Self-pay | Admitting: Rehabilitative and Restorative Service Providers"

## 2018-06-04 DIAGNOSIS — R2689 Other abnormalities of gait and mobility: Secondary | ICD-10-CM | POA: Diagnosis not present

## 2018-06-04 DIAGNOSIS — M6281 Muscle weakness (generalized): Secondary | ICD-10-CM

## 2018-06-04 DIAGNOSIS — I442 Atrioventricular block, complete: Secondary | ICD-10-CM | POA: Diagnosis not present

## 2018-06-04 DIAGNOSIS — R42 Dizziness and giddiness: Secondary | ICD-10-CM | POA: Diagnosis not present

## 2018-06-04 NOTE — Therapy (Signed)
[facility] [address] [place], [place], [address] Phone: [phone]   Fax:  [phone]  Physical Therapy Treatment  Patient Details  Name: [name] MRN: [id number] Date of Birth: 02/24/1946 Referring Provider (PT): Tanja Port NP   Encounter Date: 06/04/2018  PT End of Session - 06/04/18 0937    Visit Number  3    Number of Visits  12    Date for PT Re-Evaluation  06/26/18    PT Start Time  0934    PT Stop Time  1015    PT Time Calculation (min)  41 min    Activity Tolerance  Other (comment);Patient tolerated treatment well   some limitation due to nausea   Behavior During Therapy  [facility] for tasks assessed/performed       Past Medical History:  Diagnosis Date  . ADHD (attention deficit hyperactivity disorder)   . Allergy    trees/pollen, mold, fungus, dust mites. Takes allergy shots  . Arthritis    PAIN AND OA LEFT HIP  . Asthma    allergist Dr Olena Heckle- monthly allergy injections  . Autonomic dysfunction    CENTRAL NERVOUS SYSTEM NEUROPATHY - DX BY DR. Erling Cruz MORE THAN 10 YRS AGO - AND IT IS FELT TO CONTRIBUTE TO THE AUTOMIC  DYSFUNCTION-- PT HAS NUMBNESS LEGS AND FEET AND SOMETIIMES TIPS OF FINGER, SEVERE CONSTIPATION( NO SENSATION TO HAVE BM ), DOUBLE VISION, ORTHOSTATIC HYPOTENSION  . Blood transfusion   . Breast cancer ([place])    right side  . Bruises easily   . Cataract   . Chest pain    a. 12/2012 Cath: nl cors, EF 55-65%.  . Complication of anesthesia    reaction to some anesthetics/ 7/12 anesth record on chart- states prefers epidural  . Depression   . Diverticulosis   . Dysrhythmia    HX OF HIGH GRADE HEART BLOCK - REQUIRED PACEMAKER INSERTION  . Esophageal stricture   . Gastritis   . GERD (gastroesophageal reflux disease)   . H/O hiatal hernia   . Hemorrhoids   . Hernia of abdominal wall    spigelian hernia RLQ - SURGERY TO REPAIR  . Hypothyroidism   . Interstitial  cystitis   . Latex allergy, contact dermatitis   . Mallory - Weiss tear    HEALED   . Neuromuscular disorder (HCC)    central nervous system neuropathy- seen per Dr Erling Cruz  . Pacemaker   . Pericardial effusion    a. 12/2012 following ppm placement;  b. 01/01/2013 Echo: EF 55-60%, small pericardial effusion w/o RV collapse-->No need for tap/window.  Marland Kitchen PONV (postoperative nausea and vomiting)    pt needs scop patch  . Recurrent upper respiratory infection (URI) 1/13- to present   bronchitis following surgery- states improved but still with cough. OV with Clearance Dr [name] 09/06/11 on chart  . Shortness of breath    AT TIMES - BUT MUCH IMPROVED AFTER PACEMAKER WAS REPROGRAMED.  Marland Kitchen Symptomatic bradycardia    a. 12/2012 s/p MDT dual chamber PPM, ser # [id number]; b. 12/2012 post-op course complicated by pericardial effusinon req lead revision.  . Thyroid disease     Past Surgical History:  Procedure Laterality Date  . ABDOMINAL HYSTERECTOMY  1974  . BACK SURGERY     cervical fusion 4-5 with plate  . BLADDER SUSPENSION    . BREAST BIOPSY  2002   NO BLOOD PRESSURES ON RIGHT SIDE/   s/p  axillary node dissection  .  CYSTO WITH HYDRODISTENSION  09/07/2011   Procedure: CYSTOSCOPY/HYDRODISTENSION;  Surgeon: Ailene Rud, MD;  Location: [facility];  Service: Urology;  Laterality: N/A;  INSTILLATION OF MARCAINE/PYRIDIUM INSTILLATION OF MARCAINE/KENALOG  . CYSTOSCOPY  1975, 2007  . EYE SURGERY     LASIK EYE SURGERY BILATERAL  . LAPAROSCOPY  1973  . LEAD REVISION N/A 12/30/2012   Procedure: LEAD REVISION;  Surgeon: Evans Lance, MD;  Location: [facility] CATH LAB;  Service: Cardiovascular;  Laterality: N/A;  . LEFT HEART CATHETERIZATION WITH CORONARY ANGIOGRAM N/A 12/29/2012   Procedure: LEFT HEART CATHETERIZATION WITH CORONARY ANGIOGRAM;  Surgeon: Peter M Martinique, MD;  Location: [facility] CATH LAB;  Service: Cardiovascular;  Laterality: N/A;  . MASTECTOMY MODIFIED RADICAL     right; with immediate  reconstruction  . MYRINGOPLASTY  1962  . NECK SURGERY     c4-5 ruptured disc  . OOPHORECTOMY  1982  . PACEMAKER INSERTION    . PERMANENT PACEMAKER INSERTION N/A 12/29/2012   Procedure: PERMANENT PACEMAKER INSERTION;  Surgeon: Deboraha Sprang, MD;  Location: [facility] CATH LAB;  Service: Cardiovascular;  Laterality: N/A;  . RECTOCELE REPAIR     with cystocele repair  . TONSILLECTOMY    . TOTAL HIP ARTHROPLASTY Right   . TOTAL HIP ARTHROPLASTY Left 06/12/2013   Procedure: LEFT TOTAL HIP ARTHROPLASTY ANTERIOR APPROACH;  Surgeon: Mcarthur Rossetti, MD;  Location: [facility];  Service: Orthopedics;  Laterality: Left;  . TYMPANOPLASTY  1973   right  . ULNAR NERVE TRANSPOSITION Right   . VENTRAL HERNIA REPAIR  05/30/2011   Procedure: HERNIA REPAIR VENTRAL ADULT;  Surgeon: Haywood Lasso, MD;  Location: [place];  Service: General;  Laterality: Right;  repair right spigelian hernia    There were no vitals filed for this visit.  Subjective Assessment - 06/04/18 0934    Subjective  "I've particularly improved on the balance exercises." Patient reports when she moves her head side to side things get jumbled up when her head comes back to midline.      Pertinent History  PMH: TIA, pacemaker, cerv fusion 4-5,Rt and LT THA, CNS neruopathy, Rt side breast CA,BPPV, orthostatic hypotension,DOE,HLD,HTN    Patient Stated Goals  get back to baseline of walking up to 3 miles 3 times per week, improve balance and dizziness    Currently in Pain?  Yes   "I always have pain, but nothing worth mentioning."                      [facility] Adult PT Treatment/Exercise - 06/04/18 0937      Ambulation/Gait   Ambulation/Gait  Yes    Ambulation/Gait Assistance  6: Modified independent (Device/Increase time)    Ambulation Distance (Feet)  500 Feet    Assistive device  None    Gait Pattern  Within Functional Limits    Ambulation Surface  Level;Indoor    Gait Comments  Dynamic activities  switching from walking to slow/fast speed changes, side stepping, and looking down to midline.   Walking with ball toss working on tolerance to vertical head movement.      Neuro Re-ed    Neuro Re-ed Details   Rocker board standing performing reaching task alternating R and L UEs, head motion in 45 degree ROM R to L, looking down and to midline, and standing with eyes closed in 5 second intervals.  Foam/compliant beam standing working on head motion, reaching, and balance with CGA to min A for tasks.  The patient reports brain fog and requests a rest break.      Exercises   Exercises  Other Exercises    Other Exercises   Step up:  R and L x 10 reps in anterior direction without UE support with SBA to CGA.        Vestibular Treatment/Exercise - 06/04/18 6440      Vestibular Treatment/Exercise   Vestibular Treatment Provided  Habituation;Gaze    Habituation Exercises  Standing Horizontal Head Turns;Standing Vertical Head Turns    Gaze Exercises  X1 Viewing Horizontal;X1 Viewing Vertical      Standing Vertical Head Turns   Number of Reps   5    Symptom Description   While walking, had patient look down and then straight ahead x 5 reps with CGA for safety.      X1 Viewing Horizontal   Foot Position  standing with UE support    Reps  2    Comments  x 20 seconds x 2 sets in standing working on head/body disassociation to increase movement.      X1 Viewing Vertical   Foot Position  Standing with UE support    Comments  could tolerate 5 reps before noting quesy sensation.            PT Education - 06/04/18 1221    Education Details  Modified prior HEP to include standing Gaze x 1 adaptation in horiz and vertical planes    Person(s) Educated  Patient    Methods  Explanation;Demonstration;Handout    Comprehension  Returned demonstration;Verbalized understanding          PT Long Term Goals - 05/15/18 1344      PT LONG TERM GOAL #1   Title  Pt will be IND In HEP to improve  dizziness, balance, and gait. TARGET DATE FOR ALL LTGS: 06/25/18    Time  6    Period  Days    Status  New      PT LONG TERM GOAL #2   Title  Pt will improve DGI to at least 19 and BERG to at least 46 to show improved balance and decreased risk for falls.     Time  6    Period  Weeks    Status  New      PT LONG TERM GOAL #3   Title  Pt will report average dizziness will be </=3/10 in order to perform ADLs in safe manner.     Time  6    Period  Weeks    Status  New      PT LONG TERM GOAL #4   Title  Pt will amb. 1000' over even/uneven terrain, IND, while performing head turns without dizziness incr. >/=1 point in order to improve functional mobility and safely garden.     Time  6    Period  Weeks    Status  New      PT LONG TERM GOAL #5   Title  Pt will improve LE strength to at least 4+/5 MMT bilat to improve function.     Time  6    Period  Weeks    Status  New          Plan - 06/04/18 1223    Clinical Impression Statement  PT continuing to progress HEP and patient improving tolerance to activity today.  PT continued to upgrade the patient's HEP progressing gaze adaptation to standing.     PT Treatment/Interventions  Canalith Repostioning;Electrical Stimulation;Moist Heat;Gait training;Stair training;Functional mobility training;Therapeutic activities;Therapeutic exercise;Balance training;Neuromuscular re-education;Manual techniques;Passive range of motion;Energy conservation;Taping;Vestibular    PT Next Visit Plan  Gait, balance, dynamic head motion, L LE strengthening; *h/o cervical fusion    Consulted and Agree with Plan of Care  Patient        Patient will benefit from skilled therapeutic intervention in order to improve the following deficits and impairments:  Abnormal gait, Decreased activity tolerance, Cardiopulmonary status limiting activity, Decreased balance, Decreased coordination, Decreased endurance, Decreased safety awareness, Decreased strength, Difficulty  walking, Postural dysfunction  Visit Diagnosis: Other abnormalities of gait and mobility  Dizziness and giddiness  Muscle weakness (generalized)     Problem List Patient Active Problem List   Diagnosis Date Noted  . Chronic right shoulder pain 02/13/2017  . Short Achilles tendon (acquired), left ankle 02/13/2017  . Dizziness and giddiness 02/16/2016  . Monoallelic mutation of CHEK2 gene 02/02/2015  . Genetic testing 01/31/2015  . Altered mental status 10/25/2014  . Occipital neuralgia of left side   . Cerebral infarction ([place]) brainstem s/p IV tPA (not seen on MRI) 10/07/2014  . History of permanent cardiac pacemaker placement 10/07/2014  . Cephalalgia   . Complicated migraine   . HLD (hyperlipidemia)   . Headache 10/06/2014  . Arthritis of left hip 06/12/2013  . Status post THR (total hip replacement) 06/12/2013  . Constipation 05/25/2013  . Orthostatic hypotension 01/27/2013  . crosstalk-atrial lead 01/27/2013  . Pre-syncope 01/02/2013  . Sinus tachycardia 01/01/2013  . Second degree heart block 12/27/2012  . Hypothyroidism 12/27/2012  . Spigelian hernia 04/20/2011  . DYSPNEA 05/09/2010  . BREAST CANCER 05/05/2010  . ASTHMA 05/05/2010  . HIATAL HERNIA 05/05/2010  . DEGENERATIVE Disk DISEASE 05/05/2010  . OSTEOPENIA 05/05/2010    Addis Tuohy, PT 06/04/2018, 7:54 PM  [facility] [address] [place], [place], [address] Phone: [phone]   Fax:  [phone]  Name: [name] MRN: [id number] Date of Birth: Aug 31, 1945

## 2018-06-04 NOTE — Patient Instructions (Signed)
Access Code: [email or web address]  URL: [email or web address]  Date: 06/04/2018  Prepared by: Rudell Cobb   Program Notes  Walking: 4 minutes at a time, 3 times per day. For exercises: don't allow symptoms to go beyond a 4/10. Stop and rest in between exercises to let dizziness, nausea, or lightheadedness settle.        Exercises Romberg Stance with Head Nods - 3 reps - 1 sets - 2x daily - 7x weekly Romberg Stance with Head Rotation - 5 reps - 1 sets - 2x daily - 7x weekly Standing Tandem Balance with Counter Support - 3 reps - 1 sets - 30 hold - 1x daily - 6x weekly Sit to Stand with Armchair - 10 reps - 1 sets - 1x daily - 6x weekly Standing Single Leg Stance with Unilateral Counter Support - 3 reps - 1 sets - 30 hold - 1x daily - 6x weekly Standing Gaze Stabilization with Head Nod - 5 reps - 2 sets - 1x daily - 7x weekly Standing Gaze Stabilization with Head Rotation - 2 sets - 1x daily - 7x weekly

## 2018-06-05 NOTE — Progress Notes (Signed)
Remote pacemaker transmission.   

## 2018-06-06 ENCOUNTER — Ambulatory Visit: Payer: Medicare Other

## 2018-06-06 ENCOUNTER — Encounter: Payer: Self-pay | Admitting: Cardiology

## 2018-06-08 LAB — CUP PACEART REMOTE DEVICE CHECK
Battery Remaining Longevity: 52 mo
Battery Voltage: 2.99 V
Brady Statistic AS VS Percent: 0.04 %
Brady Statistic RA Percent Paced: 20.95 %
Implantable Lead Implant Date: 20140818
Implantable Lead Implant Date: 20140818
Implantable Lead Location: 753860
Implantable Lead Model: 5076
Implantable Pulse Generator Implant Date: 20140818
Lead Channel Impedance Value: 342 Ohm
Lead Channel Impedance Value: 456 Ohm
Lead Channel Pacing Threshold Amplitude: 0.5 V
Lead Channel Pacing Threshold Amplitude: 1 V
Lead Channel Pacing Threshold Pulse Width: 0.4 ms
Lead Channel Pacing Threshold Pulse Width: 0.4 ms
Lead Channel Sensing Intrinsic Amplitude: 1.875 mV
Lead Channel Setting Sensing Sensitivity: 0.9 mV
MDC IDC LEAD LOCATION: 753859
MDC IDC MSMT LEADCHNL RA SENSING INTR AMPL: 1.875 mV
MDC IDC MSMT LEADCHNL RV IMPEDANCE VALUE: 646 Ohm
MDC IDC MSMT LEADCHNL RV IMPEDANCE VALUE: 665 Ohm
MDC IDC MSMT LEADCHNL RV SENSING INTR AMPL: 18.5 mV
MDC IDC MSMT LEADCHNL RV SENSING INTR AMPL: 18.5 mV
MDC IDC SESS DTM: 20200122143722
MDC IDC SET LEADCHNL RA PACING AMPLITUDE: 2 V
MDC IDC SET LEADCHNL RV PACING AMPLITUDE: 2.5 V
MDC IDC SET LEADCHNL RV PACING PULSEWIDTH: 0.4 ms
MDC IDC STAT BRADY AP VP PERCENT: 21.01 %
MDC IDC STAT BRADY AP VS PERCENT: 0 %
MDC IDC STAT BRADY AS VP PERCENT: 78.94 %
MDC IDC STAT BRADY RV PERCENT PACED: 99.72 %

## 2018-06-10 ENCOUNTER — Other Ambulatory Visit: Payer: Self-pay

## 2018-06-10 NOTE — Patient Outreach (Signed)
First attempt to obtain mRs. No answer. Left message for return call.  

## 2018-06-11 ENCOUNTER — Ambulatory Visit: Payer: Medicare Other | Admitting: Rehabilitative and Restorative Service Providers"

## 2018-06-11 DIAGNOSIS — R42 Dizziness and giddiness: Secondary | ICD-10-CM | POA: Diagnosis not present

## 2018-06-11 DIAGNOSIS — M6281 Muscle weakness (generalized): Secondary | ICD-10-CM

## 2018-06-11 DIAGNOSIS — R2689 Other abnormalities of gait and mobility: Secondary | ICD-10-CM

## 2018-06-11 NOTE — Patient Instructions (Signed)
Access Code: [email or web address]  URL: [email or web address]  Date: 06/11/2018  Prepared by: Rudell Cobb   Program Notes  Walking: 6 minutes at a time, 3 times per day. For exercises: don't allow symptoms to go beyond a 4/10. Stop and rest in between exercises to let dizziness, nausea, or lightheadedness settle.        Exercises Romberg Stance with Head Nods - 3 reps - 1 sets - 2x daily - 7x weekly Romberg Stance with Head Rotation - 5 reps - 1 sets - 2x daily - 7x weekly Standing Tandem Balance with Counter Support - 3 reps - 1 sets - 30 hold - 1x daily - 6x weekly Sit to Stand with Armchair - 10 reps - 1 sets - 1x daily - 6x weekly Standing Single Leg Stance with Unilateral Counter Support - 3 reps - 1 sets - 30 hold - 1x daily - 6x weekly Standing Gaze Stabilization with Head Nod - 1 reps - 1 sets - 3x daily - 7x weekly Standing Gaze Stabilization with Head Rotation - 2 sets - 1x daily - 7x weekly

## 2018-06-11 NOTE — Therapy (Signed)
[facility] [address] [place], [place], [address] Phone: [phone]   Fax:  [phone]  Physical Therapy Treatment  Patient Details  Name: [name] MRN: [id number] Date of Birth: 26-Mar-1946 Referring Provider (PT): Tanja Port NP   Encounter Date: 06/11/2018  PT End of Session - 06/11/18 1240    Visit Number  4    Number of Visits  12    Date for PT Re-Evaluation  06/26/18    PT Start Time  1236    PT Stop Time  1315    PT Time Calculation (min)  39 min    Activity Tolerance  Patient tolerated treatment well    Behavior During Therapy  [facility] for tasks assessed/performed       Past Medical History:  Diagnosis Date  . ADHD (attention deficit hyperactivity disorder)   . Allergy    trees/pollen, mold, fungus, dust mites. Takes allergy shots  . Arthritis    PAIN AND OA LEFT HIP  . Asthma    allergist Dr Olena Heckle- monthly allergy injections  . Autonomic dysfunction    CENTRAL NERVOUS SYSTEM NEUROPATHY - DX BY DR. Erling Cruz MORE THAN 10 YRS AGO - AND IT IS FELT TO CONTRIBUTE TO THE AUTOMIC  DYSFUNCTION-- PT HAS NUMBNESS LEGS AND FEET AND SOMETIIMES TIPS OF FINGER, SEVERE CONSTIPATION( NO SENSATION TO HAVE BM ), DOUBLE VISION, ORTHOSTATIC HYPOTENSION  . Blood transfusion   . Breast cancer ([place])    right side  . Bruises easily   . Cataract   . Chest pain    a. 12/2012 Cath: nl cors, EF 55-65%.  . Complication of anesthesia    reaction to some anesthetics/ 7/12 anesth record on chart- states prefers epidural  . Depression   . Diverticulosis   . Dysrhythmia    HX OF HIGH GRADE HEART BLOCK - REQUIRED PACEMAKER INSERTION  . Esophageal stricture   . Gastritis   . GERD (gastroesophageal reflux disease)   . H/O hiatal hernia   . Hemorrhoids   . Hernia of abdominal wall    spigelian hernia RLQ - SURGERY TO REPAIR  . Hypothyroidism   . Interstitial cystitis   . Latex allergy, contact dermatitis   .  Mallory - Weiss tear    HEALED   . Neuromuscular disorder (HCC)    central nervous system neuropathy- seen per Dr Erling Cruz  . Pacemaker   . Pericardial effusion    a. 12/2012 following ppm placement;  b. 01/01/2013 Echo: EF 55-60%, small pericardial effusion w/o RV collapse-->No need for tap/window.  Marland Kitchen PONV (postoperative nausea and vomiting)    pt needs scop patch  . Recurrent upper respiratory infection (URI) 1/13- to present   bronchitis following surgery- states improved but still with cough. OV with Clearance Dr [name] 09/06/11 on chart  . Shortness of breath    AT TIMES - BUT MUCH IMPROVED AFTER PACEMAKER WAS REPROGRAMED.  Marland Kitchen Symptomatic bradycardia    a. 12/2012 s/p MDT dual chamber PPM, ser # [id number]; b. 12/2012 post-op course complicated by pericardial effusinon req lead revision.  . Thyroid disease     Past Surgical History:  Procedure Laterality Date  . ABDOMINAL HYSTERECTOMY  1974  . BACK SURGERY     cervical fusion 4-5 with plate  . BLADDER SUSPENSION    . BREAST BIOPSY  2002   NO BLOOD PRESSURES ON RIGHT SIDE/   s/p  axillary node dissection  . CYSTO WITH HYDRODISTENSION  09/07/2011  Procedure: CYSTOSCOPY/HYDRODISTENSION;  Surgeon: Ailene Rud, MD;  Location: [facility];  Service: Urology;  Laterality: N/A;  INSTILLATION OF MARCAINE/PYRIDIUM INSTILLATION OF MARCAINE/KENALOG  . CYSTOSCOPY  1975, 2007  . EYE SURGERY     LASIK EYE SURGERY BILATERAL  . LAPAROSCOPY  1973  . LEAD REVISION N/A 12/30/2012   Procedure: LEAD REVISION;  Surgeon: Evans Lance, MD;  Location: [facility] CATH LAB;  Service: Cardiovascular;  Laterality: N/A;  . LEFT HEART CATHETERIZATION WITH CORONARY ANGIOGRAM N/A 12/29/2012   Procedure: LEFT HEART CATHETERIZATION WITH CORONARY ANGIOGRAM;  Surgeon: Peter M Martinique, MD;  Location: [facility] CATH LAB;  Service: Cardiovascular;  Laterality: N/A;  . MASTECTOMY MODIFIED RADICAL     right; with immediate reconstruction  . MYRINGOPLASTY  1962  . NECK SURGERY      c4-5 ruptured disc  . OOPHORECTOMY  1982  . PACEMAKER INSERTION    . PERMANENT PACEMAKER INSERTION N/A 12/29/2012   Procedure: PERMANENT PACEMAKER INSERTION;  Surgeon: Deboraha Sprang, MD;  Location: [facility] CATH LAB;  Service: Cardiovascular;  Laterality: N/A;  . RECTOCELE REPAIR     with cystocele repair  . TONSILLECTOMY    . TOTAL HIP ARTHROPLASTY Right   . TOTAL HIP ARTHROPLASTY Left 06/12/2013   Procedure: LEFT TOTAL HIP ARTHROPLASTY ANTERIOR APPROACH;  Surgeon: Mcarthur Rossetti, MD;  Location: [facility];  Service: Orthopedics;  Laterality: Left;  . TYMPANOPLASTY  1973   right  . ULNAR NERVE TRANSPOSITION Right   . VENTRAL HERNIA REPAIR  05/30/2011   Procedure: HERNIA REPAIR VENTRAL ADULT;  Surgeon: Haywood Lasso, MD;  Location: [facility];  Service: General;  Laterality: Right;  repair right spigelian hernia    There were no vitals filed for this visit.  Subjective Assessment - 06/11/18 1238    Subjective  The patient reports that she got an upset stomach after going to lunch last week and thinks she had a food borne illness.  She has worked on home exercises.  She notices her ability to perform exercises varies from day to day.  "One day I'll do the exercises and be fine, and the next day it is wonky."    No dizziness at baseline.    Pertinent History  PMH: TIA, pacemaker, cerv fusion 4-5,Rt and LT THA, CNS neruopathy, Rt side breast CA,BPPV, orthostatic hypotension,DOE,HLD,HTN    Patient Stated Goals  get back to baseline of walking up to 3 miles 3 times per week, improve balance and dizziness    Currently in Pain?  No/denies                       [facility] Adult PT Treatment/Exercise - 06/11/18 1250      Ambulation/Gait   Ambulation/Gait  Yes    Ambulation/Gait Assistance  7: Independent    Assistive device  None    Gait Pattern  Within Functional Limits    Ambulation Surface  Level;Indoor    Gait Comments  Walked 6.5 minutes nonstop at  comfortable pace on indoor surfaces.       Neuro Re-ed    Neuro Re-ed Details   Rocker board standing with horizontal head motion, vertical head motion, eyes closed with CGA for safety with intermittent postural correction with use of UEs in parallel bars.  Foam standing with head motion horizontal/vertical and eyes closed.  Lateral rocker board with CGA for safety performing horizontal head mtion and eyes closed.  Stepping up and down laterally from foam square  with CGA.        Vestibular Treatment/Exercise - 06/11/18 1310      Vestibular Treatment/Exercise   Vestibular Treatment Provided  Gaze      X1 Viewing Horizontal   Foot Position  standing with UE support    Reps  2    Comments  30 seconds      X1 Viewing Vertical   Foot Position  standing with UE support    Reps  1    Comments  30 seconds            PT Education - 06/11/18 1434    Education Details  modified HEP increasing gaze and walking    Person(s) Educated  Patient    Methods  Explanation;Demonstration;Handout    Comprehension  Verbalized understanding;Returned demonstration          PT Long Term Goals - 05/15/18 1344      PT LONG TERM GOAL #1   Title  Pt will be IND In HEP to improve dizziness, balance, and gait. TARGET DATE FOR ALL LTGS: 06/25/18    Time  6    Period  Days    Status  New      PT LONG TERM GOAL #2   Title  Pt will improve DGI to at least 19 and BERG to at least 46 to show improved balance and decreased risk for falls.     Time  6    Period  Weeks    Status  New      PT LONG TERM GOAL #3   Title  Pt will report average dizziness will be </=3/10 in order to perform ADLs in safe manner.     Time  6    Period  Weeks    Status  New      PT LONG TERM GOAL #4   Title  Pt will amb. 1000' over even/uneven terrain, IND, while performing head turns without dizziness incr. >/=1 point in order to improve functional mobility and safely garden.     Time  6    Period  Weeks    Status  New       PT LONG TERM GOAL #5   Title  Pt will improve LE strength to at least 4+/5 MMT bilat to improve function.     Time  6    Period  Weeks    Status  New            Plan - 06/11/18 1435    Clinical Impression Statement  The patient is improving today as compared to last week.  She is now able to tolerate gaze x 1 viewing horiz x 30 seconds.  PT progressed for HEP.  The patient continues with variability in performance noting she has good and bad days.      PT Treatment/Interventions  Canalith Repostioning;Electrical Stimulation;Moist Heat;Gait training;Stair training;Functional mobility training;Therapeutic activities;Therapeutic exercise;Balance training;Neuromuscular re-education;Manual techniques;Passive range of motion;Energy conservation;Taping;Vestibular    PT Next Visit Plan  Dynamic gait activities, compliant surface standing for balance, LE strengthening (notes L leg still feels weak), gaze adaptation progression (has h/o cervical fusion)    Consulted and Agree with Plan of Care  Patient       Patient will benefit from skilled therapeutic intervention in order to improve the following deficits and impairments:  Abnormal gait, Decreased activity tolerance, Cardiopulmonary status limiting activity, Decreased balance, Decreased coordination, Decreased endurance, Decreased safety awareness, Decreased strength, Difficulty walking, Postural dysfunction  Visit Diagnosis:  Other abnormalities of gait and mobility  Dizziness and giddiness  Muscle weakness (generalized)     Problem List Patient Active Problem List   Diagnosis Date Noted  . Chronic right shoulder pain 02/13/2017  . Short Achilles tendon (acquired), left ankle 02/13/2017  . Dizziness and giddiness 02/16/2016  . Monoallelic mutation of CHEK2 gene 02/02/2015  . Genetic testing 01/31/2015  . Altered mental status 10/25/2014  . Occipital neuralgia of left side   . Cerebral infarction ([place]) brainstem s/p IV tPA  (not seen on MRI) 10/07/2014  . History of permanent cardiac pacemaker placement 10/07/2014  . Cephalalgia   . Complicated migraine   . HLD (hyperlipidemia)   . Headache 10/06/2014  . Arthritis of left hip 06/12/2013  . Status post THR (total hip replacement) 06/12/2013  . Constipation 05/25/2013  . Orthostatic hypotension 01/27/2013  . crosstalk-atrial lead 01/27/2013  . Pre-syncope 01/02/2013  . Sinus tachycardia 01/01/2013  . Second degree heart block 12/27/2012  . Hypothyroidism 12/27/2012  . Spigelian hernia 04/20/2011  . DYSPNEA 05/09/2010  . BREAST CANCER 05/05/2010  . ASTHMA 05/05/2010  . HIATAL HERNIA 05/05/2010  . DEGENERATIVE Disk DISEASE 05/05/2010  . OSTEOPENIA 05/05/2010    Kden Wagster, PT 06/11/2018, 2:37 PM  [facility] [address] [place], [place], [address] Phone: [phone]   Fax:  [phone]  Name: NASRA COUNCE MRN: [id number] Date of Birth: 04-10-1946

## 2018-06-13 ENCOUNTER — Ambulatory Visit: Payer: Medicare Other | Admitting: Physical Therapy

## 2018-06-13 DIAGNOSIS — R42 Dizziness and giddiness: Secondary | ICD-10-CM | POA: Diagnosis not present

## 2018-06-13 DIAGNOSIS — M6281 Muscle weakness (generalized): Secondary | ICD-10-CM

## 2018-06-13 DIAGNOSIS — R2689 Other abnormalities of gait and mobility: Secondary | ICD-10-CM

## 2018-06-13 NOTE — Therapy (Signed)
[facility] [address] [place], [place], [address] Phone: [phone]   Fax:  [phone]  Physical Therapy Treatment  Patient Details  Name: SUSANA DUELL MRN: [id number] Date of Birth: February 26, 1946 Referring Provider (PT): Tanja Port NP   Encounter Date: 06/13/2018  PT End of Session - 06/13/18 1152    Visit Number  5    Number of Visits  12    Date for PT Re-Evaluation  06/26/18    Authorization Type  Medicare and [employer] for [employer] - NO PTA.  10th visit PN    PT Start Time  1105    PT Stop Time  1145    PT Time Calculation (min)  40 min    Activity Tolerance  Patient tolerated treatment well    Behavior During Therapy  [facility] for tasks assessed/performed       Past Medical History:  Diagnosis Date  . ADHD (attention deficit hyperactivity disorder)   . Allergy    trees/pollen, mold, fungus, dust mites. Takes allergy shots  . Arthritis    PAIN AND OA LEFT HIP  . Asthma    allergist Dr Olena Heckle- monthly allergy injections  . Autonomic dysfunction    CENTRAL NERVOUS SYSTEM NEUROPATHY - DX BY DR. Erling Cruz MORE THAN 10 YRS AGO - AND IT IS FELT TO CONTRIBUTE TO THE AUTOMIC  DYSFUNCTION-- PT HAS NUMBNESS LEGS AND FEET AND SOMETIIMES TIPS OF FINGER, SEVERE CONSTIPATION( NO SENSATION TO HAVE BM ), DOUBLE VISION, ORTHOSTATIC HYPOTENSION  . Blood transfusion   . Breast cancer ([place])    right side  . Bruises easily   . Cataract   . Chest pain    a. 12/2012 Cath: nl cors, EF 55-65%.  . Complication of anesthesia    reaction to some anesthetics/ 7/12 anesth record on chart- states prefers epidural  . Depression   . Diverticulosis   . Dysrhythmia    HX OF HIGH GRADE HEART BLOCK - REQUIRED PACEMAKER INSERTION  . Esophageal stricture   . Gastritis   . GERD (gastroesophageal reflux disease)   . H/O hiatal hernia   . Hemorrhoids   . Hernia of abdominal wall    spigelian hernia RLQ - SURGERY TO REPAIR  .  Hypothyroidism   . Interstitial cystitis   . Latex allergy, contact dermatitis   . Mallory - Weiss tear    HEALED   . Neuromuscular disorder (HCC)    central nervous system neuropathy- seen per Dr Erling Cruz  . Pacemaker   . Pericardial effusion    a. 12/2012 following ppm placement;  b. 01/01/2013 Echo: EF 55-60%, small pericardial effusion w/o RV collapse-->No need for tap/window.  Marland Kitchen PONV (postoperative nausea and vomiting)    pt needs scop patch  . Recurrent upper respiratory infection (URI) 1/13- to present   bronchitis following surgery- states improved but still with cough. OV with Clearance Dr [name] 09/06/11 on chart  . Shortness of breath    AT TIMES - BUT MUCH IMPROVED AFTER PACEMAKER WAS REPROGRAMED.  Marland Kitchen Symptomatic bradycardia    a. 12/2012 s/p MDT dual chamber PPM, ser # [id number]; b. 12/2012 post-op course complicated by pericardial effusinon req lead revision.  . Thyroid disease     Past Surgical History:  Procedure Laterality Date  . ABDOMINAL HYSTERECTOMY  1974  . BACK SURGERY     cervical fusion 4-5 with plate  . BLADDER SUSPENSION    . BREAST BIOPSY  2002   NO BLOOD PRESSURES ON  RIGHT SIDE/   s/p  axillary node dissection  . CYSTO WITH HYDRODISTENSION  09/07/2011   Procedure: CYSTOSCOPY/HYDRODISTENSION;  Surgeon: Ailene Rud, MD;  Location: [facility];  Service: Urology;  Laterality: N/A;  INSTILLATION OF MARCAINE/PYRIDIUM INSTILLATION OF MARCAINE/KENALOG  . CYSTOSCOPY  1975, 2007  . EYE SURGERY     LASIK EYE SURGERY BILATERAL  . LAPAROSCOPY  1973  . LEAD REVISION N/A 12/30/2012   Procedure: LEAD REVISION;  Surgeon: Evans Lance, MD;  Location: [facility] CATH LAB;  Service: Cardiovascular;  Laterality: N/A;  . LEFT HEART CATHETERIZATION WITH CORONARY ANGIOGRAM N/A 12/29/2012   Procedure: LEFT HEART CATHETERIZATION WITH CORONARY ANGIOGRAM;  Surgeon: Peter M Martinique, MD;  Location: [facility] CATH LAB;  Service: Cardiovascular;  Laterality: N/A;  . MASTECTOMY MODIFIED RADICAL      right; with immediate reconstruction  . MYRINGOPLASTY  1962  . NECK SURGERY     c4-5 ruptured disc  . OOPHORECTOMY  1982  . PACEMAKER INSERTION    . PERMANENT PACEMAKER INSERTION N/A 12/29/2012   Procedure: PERMANENT PACEMAKER INSERTION;  Surgeon: Deboraha Sprang, MD;  Location: [facility] CATH LAB;  Service: Cardiovascular;  Laterality: N/A;  . RECTOCELE REPAIR     with cystocele repair  . TONSILLECTOMY    . TOTAL HIP ARTHROPLASTY Right   . TOTAL HIP ARTHROPLASTY Left 06/12/2013   Procedure: LEFT TOTAL HIP ARTHROPLASTY ANTERIOR APPROACH;  Surgeon: Mcarthur Rossetti, MD;  Location: [facility];  Service: Orthopedics;  Laterality: Left;  . TYMPANOPLASTY  1973   right  . ULNAR NERVE TRANSPOSITION Right   . VENTRAL HERNIA REPAIR  05/30/2011   Procedure: HERNIA REPAIR VENTRAL ADULT;  Surgeon: Haywood Lasso, MD;  Location: [facility];  Service: General;  Laterality: Right;  repair right spigelian hernia    There were no vitals filed for this visit.  Subjective Assessment - 06/13/18 1106    Subjective  Pt reports she has been working hard at the exercises.    Pertinent History  PMH: TIA, pacemaker, cerv fusion 4-5,Rt and LT THA, CNS neruopathy, Rt side breast CA,BPPV, orthostatic hypotension,DOE,HLD,HTN    Patient Stated Goals  get back to baseline of walking up to 3 miles 3 times per week, improve balance and dizziness    Currently in Pain?  No/denies                        Vestibular Treatment/Exercise - 06/13/18 1112      Vestibular Treatment/Exercise   Vestibular Treatment Provided  Gaze;Habituation    Habituation Exercises  Seated Horizontal Head Turns;Seated Vertical Head Turns    Gaze Exercises  X1 Viewing Horizontal;X1 Viewing Vertical      Seated Horizontal Head Turns   Number of Reps   10    Symptom Description   with compensatory saccades      Seated Vertical Head Turns   Number of Reps   5    Symptom Description   with compensatory saccades       X1 Viewing Horizontal   Foot Position  standing feet apart, bilat UE support    Reps  2    Comments  30 seconds      X1 Viewing Vertical   Foot Position  standing feet apart, bilat UE support    Reps  2    Comments  30 seconds, symptoms mild (2/10)   feels like pressure in head        Balance Exercises -  06/13/18 1116      Balance Exercises: Standing   Standing Eyes Opened  Narrow base of support (BOS);Head turns;Solid surface;5 reps;Other reps (comment)   10 reps up/down, 5 side to side due to symptoms       PT Education - 06/13/18 1150    Education Details  added compensatory saccades and how to use functional for increased stability    Person(s) Educated  Patient    Methods  Explanation;Demonstration;Handout    Comprehension  Verbalized understanding;Returned demonstration          PT Long Term Goals - 06/13/18 1159      PT LONG TERM GOAL #1   Title  Pt will be IND In HEP to improve dizziness, balance, and gait. TARGET DATE FOR ALL LTGS: 06/26/18    Time  6    Period  Weeks    Status  New      PT LONG TERM GOAL #2   Title  Pt will improve DGI to at least 19 and BERG to at least 46 to show improved balance and decreased risk for falls.     Time  6    Period  Weeks    Status  New      PT LONG TERM GOAL #3   Title  Pt will report average dizziness will be </=3/10 in order to perform ADLs in safe manner.     Time  6    Period  Weeks    Status  New      PT LONG TERM GOAL #4   Title  Pt will amb. 1000' over even/uneven terrain, IND, while performing head turns without dizziness incr. >/=1 point in order to improve functional mobility and safely garden.     Time  6    Period  Weeks    Status  New      PT LONG TERM GOAL #5   Title  Pt will improve LE strength to at least 4+/5 MMT bilat to improve function.     Time  6    Period  Weeks    Status  New            Plan - 06/13/18 1153    Clinical Impression Statement  Unable to advance or  progress x1 viewing or corner balance exercises today; pt contiues to report mild-moderate symptoms with each exercise.  Educated pt on use of compensatory saccades during functional movement for increase stability during sit <> stand and turning.  Pt tolerated compensatory saccades well but becomes frustrated with symptoms and progress.  Will continue to address and progress as pt is able to tolerate.    PT Treatment/Interventions  Canalith Repostioning;Electrical Stimulation;Moist Heat;Gait training;Stair training;Functional mobility training;Therapeutic activities;Therapeutic exercise;Balance training;Neuromuscular re-education;Manual techniques;Passive range of motion;Energy conservation;Taping;Vestibular    PT Next Visit Plan  Incorporate compensatory saccades into functional movement/gait for stability.  Dynamic gait activities, compliant surface standing for balance, LE strengthening (notes L leg still feels weak), gaze adaptation progression (has h/o cervical fusion)    PT Home Exercise Plan  [email or web address]    Consulted and Agree with Plan of Care  Patient       Patient will benefit from skilled therapeutic intervention in order to improve the following deficits and impairments:  Abnormal gait, Decreased activity tolerance, Cardiopulmonary status limiting activity, Decreased balance, Decreased coordination, Decreased endurance, Decreased safety awareness, Decreased strength, Difficulty walking, Postural dysfunction  Visit Diagnosis: Other abnormalities of gait and mobility  Dizziness and giddiness  Muscle  weakness (generalized)     Problem List Patient Active Problem List   Diagnosis Date Noted  . Chronic right shoulder pain 02/13/2017  . Short Achilles tendon (acquired), left ankle 02/13/2017  . Dizziness and giddiness 02/16/2016  . Monoallelic mutation of CHEK2 gene 02/02/2015  . Genetic testing 01/31/2015  . Altered mental status 10/25/2014  . Occipital neuralgia of left side   .  Cerebral infarction ([place]) brainstem s/p IV tPA (not seen on MRI) 10/07/2014  . History of permanent cardiac pacemaker placement 10/07/2014  . Cephalalgia   . Complicated migraine   . HLD (hyperlipidemia)   . Headache 10/06/2014  . Arthritis of left hip 06/12/2013  . Status post THR (total hip replacement) 06/12/2013  . Constipation 05/25/2013  . Orthostatic hypotension 01/27/2013  . crosstalk-atrial lead 01/27/2013  . Pre-syncope 01/02/2013  . Sinus tachycardia 01/01/2013  . Second degree heart block 12/27/2012  . Hypothyroidism 12/27/2012  . Spigelian hernia 04/20/2011  . DYSPNEA 05/09/2010  . BREAST CANCER 05/05/2010  . ASTHMA 05/05/2010  . HIATAL HERNIA 05/05/2010  . DEGENERATIVE Disk DISEASE 05/05/2010  . OSTEOPENIA 05/05/2010   Rico Junker, PT, DPT 06/13/18    12:00 PM    [facility] [address] [place], [place], [address] Phone: [phone]   Fax:  [phone]  Name: SAINA WAAGE MRN: [id number] Date of Birth: October 12, 1945

## 2018-06-13 NOTE — Patient Instructions (Addendum)
Compensatory Strategies: Corrective Saccades    1. Tape 4 playing cards on a wall in a t/cross pattern __12__ inches apart, sit about 3-6 feet away from wall.  Move eyes to target, keep head still. 2. Then move head in direction of target while eyes remain on target. 3/4. Repeat in opposite direction. Perform sitting. Repeat sequence __10__ times per session side to side.  10 times up and down. Do __2__ sessions per day.  Copyright  VHI. All rights reserved.

## 2018-06-18 ENCOUNTER — Ambulatory Visit: Payer: Medicare Other | Attending: Adult Health | Admitting: Rehabilitative and Restorative Service Providers"

## 2018-06-18 DIAGNOSIS — M6281 Muscle weakness (generalized): Secondary | ICD-10-CM

## 2018-06-18 DIAGNOSIS — R2689 Other abnormalities of gait and mobility: Secondary | ICD-10-CM

## 2018-06-18 DIAGNOSIS — R42 Dizziness and giddiness: Secondary | ICD-10-CM | POA: Diagnosis not present

## 2018-06-18 NOTE — Therapy (Signed)
[facility] [address] [place], [place], [address] Phone: [phone]   Fax:  [phone]  Physical Therapy Treatment  Patient Details  Name: [name] MRN: [id number] Date of Birth: 04/08/1946 Referring Provider (PT): Tanja Port NP   Encounter Date: 06/18/2018  PT End of Session - 06/18/18 1116    Visit Number  6    Number of Visits  12    Date for PT Re-Evaluation  06/26/18    Authorization Type  Medicare and [employer] for [employer] - NO PTA.  10th visit PN    PT Start Time  1110    PT Stop Time  1152    PT Time Calculation (min)  42 min    Activity Tolerance  Patient tolerated treatment well    Behavior During Therapy  WFL for tasks assessed/performed       Past Medical History:  Diagnosis Date  . ADHD (attention deficit hyperactivity disorder)   . Allergy    trees/pollen, mold, fungus, dust mites. Takes allergy shots  . Arthritis    PAIN AND OA LEFT HIP  . Asthma    allergist Dr Olena Heckle- monthly allergy injections  . Autonomic dysfunction    CENTRAL NERVOUS SYSTEM NEUROPATHY - DX BY DR. Erling Cruz MORE THAN 10 YRS AGO - AND IT IS FELT TO CONTRIBUTE TO THE AUTOMIC  DYSFUNCTION-- PT HAS NUMBNESS LEGS AND FEET AND SOMETIIMES TIPS OF FINGER, SEVERE CONSTIPATION( NO SENSATION TO HAVE BM ), DOUBLE VISION, ORTHOSTATIC HYPOTENSION  . Blood transfusion   . Breast cancer ([place])    right side  . Bruises easily   . Cataract   . Chest pain    a. 12/2012 Cath: nl cors, EF 55-65%.  . Complication of anesthesia    reaction to some anesthetics/ 7/12 anesth record on chart- states prefers epidural  . Depression   . Diverticulosis   . Dysrhythmia    HX OF HIGH GRADE HEART BLOCK - REQUIRED PACEMAKER INSERTION  . Esophageal stricture   . Gastritis   . GERD (gastroesophageal reflux disease)   . H/O hiatal hernia   . Hemorrhoids   . Hernia of abdominal wall    spigelian hernia RLQ - SURGERY TO REPAIR  .  Hypothyroidism   . Interstitial cystitis   . Latex allergy, contact dermatitis   . Mallory - Weiss tear    HEALED   . Neuromuscular disorder (H[facility])    central nervous system neuropathy- seen per Dr Erling Cruz  . Pacemaker   . Pericardial effusion    a. 12/2012 following ppm placement;  b. 01/01/2013 Echo: EF 55-60%, small pericardial effusion w/o RV collapse-->No need for tap/window.  Marland Kitchen PONV (postoperative nausea and vomiting)    pt needs scop patch  . Recurrent upper respiratory infection (URI) 1/13- to present   bronchitis following surgery- states improved but still with cough. OV with [facility] Dr [name] 09/06/11 on chart  . Shortness of breath    AT TIMES - BUT MUCH IMPROVED AFTER PACEMAKER WAS REPROGRAMED.  Marland Kitchen Symptomatic bradycardia    a. 12/2012 s/p MDT dual chamber PPM, ser # [id number]; b. 12/2012 post-op course complicated by pericardial effusinon req lead revision.  . Thyroid disease     Past Surgical History:  Procedure Laterality Date  . ABDOMINAL HYSTERECTOMY  1974  . BACK SURGERY     cervical fusion 4-5 with plate  . BLADDER SUSPENSION    . BREAST BIOPSY  2002   NO BLOOD PRESSURES ON  RIGHT SIDE/   s/p  axillary node dissection  . CYSTO WITH HYDRODISTENSION  09/07/2011   Procedure: CYSTOSCOPY/HYDRODISTENSION;  Surgeon: Ailene Rud, MD;  Location: [facility];  Service: Urology;  Laterality: N/A;  INSTILLATION OF MARCAINE/PYRIDIUM INSTILLATION OF MARCAINE/KENALOG  . CYSTOSCOPY  1975, 2007  . EYE SURGERY     LASIK EYE SURGERY BILATERAL  . LAPAROSCOPY  1973  . LEAD REVISION N/A 12/30/2012   Procedure: LEAD REVISION;  Surgeon: Evans Lance, MD;  Location: [facility] CATH LAB;  Service: Cardiovascular;  Laterality: N/A;  . LEFT HEART CATHETERIZATION WITH CORONARY ANGIOGRAM N/A 12/29/2012   Procedure: LEFT HEART CATHETERIZATION WITH CORONARY ANGIOGRAM;  Surgeon: Peter M Martinique, MD;  Location: [facility] CATH LAB;  Service: Cardiovascular;  Laterality: N/A;  . MASTECTOMY MODIFIED RADICAL      right; with immediate reconstruction  . MYRINGOPLASTY  1962  . NECK SURGERY     c4-5 ruptured disc  . OOPHORECTOMY  1982  . PACEMAKER INSERTION    . PERMANENT PACEMAKER INSERTION N/A 12/29/2012   Procedure: PERMANENT PACEMAKER INSERTION;  Surgeon: Deboraha Sprang, MD;  Location: [facility] CATH LAB;  Service: Cardiovascular;  Laterality: N/A;  . RECTOCELE REPAIR     with cystocele repair  . TONSILLECTOMY    . TOTAL HIP ARTHROPLASTY Right   . TOTAL HIP ARTHROPLASTY Left 06/12/2013   Procedure: LEFT TOTAL HIP ARTHROPLASTY ANTERIOR APPROACH;  Surgeon: Mcarthur Rossetti, MD;  Location: [facility];  Service: Orthopedics;  Laterality: Left;  . TYMPANOPLASTY  1973   right  . ULNAR NERVE TRANSPOSITION Right   . VENTRAL HERNIA REPAIR  05/30/2011   Procedure: HERNIA REPAIR VENTRAL ADULT;  Surgeon: Haywood Lasso, MD;  Location: [facility];  Service: General;  Laterality: Right;  repair right spigelian hernia    There were no vitals filed for this visit.  Subjective Assessment - 06/18/18 1111    Subjective  The patient reports tings are going well.  "I really like what the lady last week had me doing."    Pertinent History  PMH: TIA, pacemaker, cerv fusion 4-5,Rt and LT THA, CNS neruopathy, Rt side breast CA,BPPV, orthostatic hypotension,DOE,HLD,HTN    Patient Stated Goals  get back to baseline of walking up to 3 miles 3 times per week, improve balance and dizziness    Currently in Pain?  No/denies         [facility] PT Assessment - 06/18/18 1113      Standardized Balance Assessment   Standardized Balance Assessment  Berg Balance Test;Dynamic Gait Index      Berg Balance Test   Sit to Stand  Able to stand without using hands and stabilize independently    Standing Unsupported  Able to stand safely 2 minutes    Sitting with Back Unsupported but Feet Supported on Floor or Stool  Able to sit safely and securely 2 minutes    Stand to Sit  Sits safely with minimal use of hands     Transfers  Able to transfer safely, minor use of hands    Standing Unsupported with Eyes Closed  Able to stand 10 seconds safely    Standing Ubsupported with Feet Together  Able to place feet together independently and stand 1 minute safely    From Standing, Reach Forward with Outstretched Arm  Can reach confidently >25 cm (10")    From Standing Position, Pick up Object from Floor  Able to pick up shoe safely and easily    From  Standing Position, Turn to Look Behind Over each Shoulder  Looks behind from both sides and weight shifts well    Turn 360 Degrees  Able to turn 360 degrees safely in 4 seconds or less    Standing Unsupported, Alternately Place Feet on Step/Stool  Able to stand independently and safely and complete 8 steps in 20 seconds    Standing Unsupported, One Foot in Front  Able to plae foot ahead of the other independently and hold 30 seconds    Standing on One Leg  Able to lift leg independently and hold 5-10 seconds    Total Score  54    Berg comment:  54/56      Dynamic Gait Index   Level Surface  Normal    Change in Gait Speed  Normal    Gait with Horizontal Head Turns  Mild Impairment    Gait with Vertical Head Turns  Mild Impairment    Gait and Pivot Turn  Normal    Step Over Obstacle  Mild Impairment    Step Around Obstacles  Mild Impairment    Steps  Mild Impairment    Total Score  19    DGI comment:  19/24, improved from 6/24 at initial evaluation.                   [facility] Adult PT Treatment/Exercise - 06/18/18 1113      Ambulation/Gait   Ambulation/Gait  Yes    Ambulation/Gait Assistance  7: Independent    Assistive device  None    Gait Pattern  Within Functional Limits    Ambulation Surface  Level;Indoor    Gait Comments  Dynamic gait activities working on compensatory turning in place adding it to walking.   Slow/fast gait, and forward/backwards walking.      Self-Care   Self-Care  Other Self-Care Comments    Other Self-Care Comments   The  patient notes she is not walking outdoors yet for exercise.  She is doing other HEP.   "I feel like I'm making progress."  She still notes some dizziness when her husband enters her visual field (even if her head is still); bending down increases sensations of dizziness.    We discussed return to community exercises at [employer].      Neuro Re-ed    Neuro Re-ed Details   Compensatory saccades during turns and progressed to gait, tandem gait with narrow base of support.      Vestibular Treatment/Exercise - 06/18/18 1227      Vestibular Treatment/Exercise   Vestibular Treatment Provided  Gaze    Gaze Exercises  X1 Viewing Horizontal;X1 Viewing Vertical      X1 Viewing Horizontal   Foot Position  standing feet apart    Comments  able to do 30 seconds with minimal symptoms; increased to 60 seconds for HEP      X1 Viewing Vertical   Foot Position  standing feet apart.    Comments  able to do 30 seconds with minimal symptoms; increased to 60 seconds for HEP            PT Education - 06/18/18 1227    Education Details  progressed gaze to 60 seconds    Person(s) Educated  Patient    Methods  Explanation;Demonstration;Handout    Comprehension  Returned demonstration;Verbalized understanding          PT Long Term Goals - 06/18/18 1135      PT LONG TERM GOAL #1  Title  Pt will be IND In HEP to improve dizziness, balance, and gait. TARGET DATE FOR ALL LTGS: 06/26/18    Time  6    Period  Weeks    Status  New      PT LONG TERM GOAL #2   Title  Pt will improve DGI to at least 19 and BERG to at least 46 to show improved balance and decreased risk for falls.     Baseline  DGI is 19/24 and Merrilee Jansky is 54/56.    Time  6    Period  Weeks    Status  Achieved      PT LONG TERM GOAL #3   Title  Pt will report average dizziness will be </=3/10 in order to perform ADLs in safe manner.     Baseline  20% of the time she gets dizziness worse with turning in the kitchen and cooking.  In  laundry room when moving clothes over or trying to put clean sheets on the bed.  Dizziness stays at or below a 3/10 (she now stops when she gets worsening symptoms).      Time  6    Period  Weeks    Status  Achieved      PT LONG TERM GOAL #4   Title  Pt will amb. 1000' over even/uneven terrain, IND, while performing head turns without dizziness incr. >/=1 point in order to improve functional mobility and safely garden.     Time  6    Period  Weeks    Status  New      PT LONG TERM GOAL #5   Title  Pt will improve LE strength to at least 4+/5 MMT bilat to improve function.     Time  6    Period  Weeks    Status  New            Plan - 06/18/18 1229    Clinical Impression Statement  The patient met 2 LTGs.  PT to continue to progress towards other LTGs progressing HEP.  May need to extend end date (will discuss next week) to f/u after returning to community exercise program.    PT Treatment/Interventions  Canalith Repostioning;Electrical Stimulation;Moist Heat;Gait training;Stair training;Functional mobility training;Therapeutic activities;Therapeutic exercise;Balance training;Neuromuscular re-education;Manual techniques;Passive range of motion;Energy conservation;Taping;Vestibular    PT Next Visit Plan  check LTGs, determine return to community wellness, home walking/community walking activities.    Consulted and Agree with Plan of Care  Patient       Patient will benefit from skilled therapeutic intervention in order to improve the following deficits and impairments:  Abnormal gait, Decreased activity tolerance, Cardiopulmonary status limiting activity, Decreased balance, Decreased coordination, Decreased endurance, Decreased safety awareness, Decreased strength, Difficulty walking, Postural dysfunction  Visit Diagnosis: Other abnormalities of gait and mobility  Dizziness and giddiness  Muscle weakness (generalized)     Problem List Patient Active Problem List   Diagnosis  Date Noted  . Chronic right shoulder pain 02/13/2017  . Short Achilles tendon (acquired), left ankle 02/13/2017  . Dizziness and giddiness 02/16/2016  . Monoallelic mutation of CHEK2 gene 02/02/2015  . Genetic testing 01/31/2015  . Altered mental status 10/25/2014  . Occipital neuralgia of left side   . Cerebral infarction ([place]) brainstem s/p IV tPA (not seen on MRI) 10/07/2014  . History of permanent cardiac pacemaker placement 10/07/2014  . Cephalalgia   . Complicated migraine   . HLD (hyperlipidemia)   . Headache 10/06/2014  .  Arthritis of left hip 06/12/2013  . Status post THR (total hip replacement) 06/12/2013  . Constipation 05/25/2013  . Orthostatic hypotension 01/27/2013  . crosstalk-atrial lead 01/27/2013  . Pre-syncope 01/02/2013  . Sinus tachycardia 01/01/2013  . Second degree heart block 12/27/2012  . Hypothyroidism 12/27/2012  . Spigelian hernia 04/20/2011  . DYSPNEA 05/09/2010  . BREAST CANCER 05/05/2010  . ASTHMA 05/05/2010  . HIATAL HERNIA 05/05/2010  . DEGENERATIVE Disk DISEASE 05/05/2010  . OSTEOPENIA 05/05/2010    Bridgette Wolden, PT 06/18/2018, 12:30 PM  [facility] [address] [place], [place], [address] Phone: [phone]   Fax:  [phone]  Name: [name] MRN: [id number] Date of Birth: [birth year]

## 2018-06-18 NOTE — Patient Instructions (Signed)
Access Code: [email or web address]  URL: [email or web address]  Date: 06/18/2018  Prepared by: Rudell Cobb   Program Notes  Walking: 6 minutes at a time, 3 times per day. For exercises: don't allow symptoms to go beyond a 4/10. Stop and rest in between exercises to let dizziness, nausea, or lightheadedness settle.        Exercises Romberg Stance with Head Nods - 3 reps - 1 sets - 2x daily - 7x weekly Romberg Stance with Head Rotation - 5 reps - 1 sets - 2x daily - 7x weekly Standing Tandem Balance with Counter Support - 3 reps - 1 sets - 30 hold - 1x daily - 6x weekly Sit to Stand with Armchair - 10 reps - 1 sets - 1x daily - 6x weekly Standing Single Leg Stance with Unilateral Counter Support - 3 reps - 1 sets - 30 hold - 1x daily - 6x weekly Standing Gaze Stabilization with Head Nod - 1 reps - 1 sets - 3x daily - 7x weekly Standing Gaze Stabilization with Head Rotation - 2 sets - 1x daily - 7x weekly

## 2018-06-20 ENCOUNTER — Ambulatory Visit: Payer: Medicare Other | Admitting: Rehabilitative and Restorative Service Providers"

## 2018-06-20 ENCOUNTER — Other Ambulatory Visit (INDEPENDENT_AMBULATORY_CARE_PROVIDER_SITE_OTHER): Payer: Self-pay

## 2018-06-20 DIAGNOSIS — M6281 Muscle weakness (generalized): Secondary | ICD-10-CM | POA: Diagnosis not present

## 2018-06-20 DIAGNOSIS — E785 Hyperlipidemia, unspecified: Secondary | ICD-10-CM

## 2018-06-20 DIAGNOSIS — R42 Dizziness and giddiness: Secondary | ICD-10-CM

## 2018-06-20 DIAGNOSIS — R2689 Other abnormalities of gait and mobility: Secondary | ICD-10-CM | POA: Diagnosis not present

## 2018-06-20 DIAGNOSIS — Z0289 Encounter for other administrative examinations: Secondary | ICD-10-CM

## 2018-06-20 NOTE — Therapy (Signed)
[facility] [address] [place], [place], [address] Phone: [phone]   Fax:  [phone]  Physical Therapy Treatment  Patient Details  Name: [name] MRN: [id number] Date of Birth: [birth year] Referring Provider (PT): Tanja Port NP   Encounter Date: 06/20/2018  PT End of Session - 06/20/18 1110    Visit Number  7    Number of Visits  12    Date for PT Re-Evaluation  06/26/18    Authorization Type  Medicare and T[employer] for Life - NO PTA.  10th visit PN    PT Start Time  1108    PT Stop Time  1140    PT Time Calculation (min)  32 min    Activity Tolerance  Patient tolerated treatment well    Behavior During Therapy  WFL for tasks assessed/performed       Past Medical History:  Diagnosis Date  . ADHD (attention deficit hyperactivity disorder)   . Allergy    trees/pollen, mold, fungus, dust mites. Takes allergy shots  . Arthritis    PAIN AND OA LEFT HIP  . Asthma    allergist Dr Olena Heckle- monthly allergy injections  . Autonomic dysfunction    CENTRAL NERVOUS SYSTEM NEUROPATHY - DX BY DR. Erling Cruz MORE THAN 10 YRS AGO - AND IT IS FELT TO CONTRIBUTE TO THE AUTOMIC  DYSFUNCTION-- PT HAS NUMBNESS LEGS AND FEET AND SOMETIIMES TIPS OF FINGER, SEVERE CONSTIPATION( NO SENSATION TO HAVE BM ), DOUBLE VISION, ORTHOSTATIC HYPOTENSION  . Blood transfusion   . Breast cancer ([place])    right side  . Bruises easily   . Cataract   . Chest pain    a. 12/2012 Cath: nl cors, EF 55-65%.  . Complication of anesthesia    reaction to some anesthetics/ 7/12 anesth record on chart- states prefers epidural  . Depression   . Diverticulosis   . Dysrhythmia    HX OF HIGH GRADE HEART BLOCK - REQUIRED PACEMAKER INSERTION  . Esophageal stricture   . Gastritis   . GERD (gastroesophageal reflux disease)   . H/O hiatal hernia   . Hemorrhoids   . Hernia of abdominal wall    spigelian hernia RLQ - SURGERY TO REPAIR  .  Hypothyroidism   . Interstitial cystitis   . Latex allergy, contact dermatitis   . Mallory - Weiss tear    HEALED   . Neuromuscular disorder (HCC)    central nervous system neuropathy- seen per Dr Erling Cruz  . Pacemaker   . Pericardial effusion    a. 12/2012 following ppm placement;  b. 01/01/2013 Echo: EF 55-60%, small pericardial effusion w/o RV collapse-->No need for tap/window.  Marland Kitchen PONV (postoperative nausea and vomiting)    pt needs scop patch  . Recurrent upper respiratory infection (URI) 1/13- to present   bronchitis following surgery- states improved but still with cough. OV with Clearance Dr [name] 09/06/11 on chart  . Shortness of breath    AT TIMES - BUT MUCH IMPROVED AFTER PACEMAKER WAS REPROGRAMED.  Marland Kitchen Symptomatic bradycardia    a. 12/2012 s/p MDT dual chamber PPM, ser # [id number]; b. 12/2012 post-op course complicated by pericardial effusinon req lead revision.  . Thyroid disease     Past Surgical History:  Procedure Laterality Date  . ABDOMINAL HYSTERECTOMY  1974  . BACK SURGERY     cervical fusion 4-5 with plate  . BLADDER SUSPENSION    . BREAST BIOPSY  2002   NO BLOOD PRESSURES ON  RIGHT SIDE/   s/p  axillary node dissection  . CYSTO WITH HYDRODISTENSION  09/07/2011   Procedure: CYSTOSCOPY/HYDRODISTENSION;  Surgeon: Ailene Rud, MD;  Location: [facility];  Service: Urology;  Laterality: N/A;  INSTILLATION OF MARCAINE/PYRIDIUM INSTILLATION OF MARCAINE/KENALOG  . CYSTOSCOPY  1975, 2007  . EYE SURGERY     LASIK EYE SURGERY BILATERAL  . LAPAROSCOPY  1973  . LEAD REVISION N/A 12/30/2012   Procedure: LEAD REVISION;  Surgeon: Evans Lance, MD;  Location: [facility] CATH LAB;  Service: Cardiovascular;  Laterality: N/A;  . LEFT HEART CATHETERIZATION WITH CORONARY ANGIOGRAM N/A 12/29/2012   Procedure: LEFT HEART CATHETERIZATION WITH CORONARY ANGIOGRAM;  Surgeon: Peter M Martinique, MD;  Location: [facility] CATH LAB;  Service: Cardiovascular;  Laterality: N/A;  . MASTECTOMY MODIFIED RADICAL      right; with immediate reconstruction  . MYRINGOPLASTY  1962  . NECK SURGERY     c4-5 ruptured disc  . OOPHORECTOMY  1982  . PACEMAKER INSERTION    . PERMANENT PACEMAKER INSERTION N/A 12/29/2012   Procedure: PERMANENT PACEMAKER INSERTION;  Surgeon: Deboraha Sprang, MD;  Location: [facility] CATH LAB;  Service: Cardiovascular;  Laterality: N/A;  . RECTOCELE REPAIR     with cystocele repair  . TONSILLECTOMY    . TOTAL HIP ARTHROPLASTY Right   . TOTAL HIP ARTHROPLASTY Left 06/12/2013   Procedure: LEFT TOTAL HIP ARTHROPLASTY ANTERIOR APPROACH;  Surgeon: Mcarthur Rossetti, MD;  Location: [facility];  Service: Orthopedics;  Laterality: Left;  . TYMPANOPLASTY  1973   right  . ULNAR NERVE TRANSPOSITION Right   . VENTRAL HERNIA REPAIR  05/30/2011   Procedure: HERNIA REPAIR VENTRAL ADULT;  Surgeon: Haywood Lasso, MD;  Location: [facility];  Service: General;  Laterality: Right;  repair right spigelian hernia    There were no vitals filed for this visit.  Subjective Assessment - 06/20/18 1110    Subjective  The patient notes that she go tup to 60 seconds with gaze in sitting.  She feels like she is making progress.      Pertinent History  PMH: TIA, pacemaker, cerv fusion 4-5,Rt and LT THA, CNS neruopathy, Rt side breast CA,BPPV, orthostatic hypotension,DOE,HLD,HTN    Patient Stated Goals  get back to baseline of walking up to 3 miles 3 times per week, improve balance and dizziness    Currently in Pain?  No/denies         [facility] PT Assessment - 06/20/18 1112      Ambulation/Gait   Ambulation/Gait  Yes    Ambulation/Gait Assistance  7: Independent;4: Min guard    Ambulation Distance (Feet)  500 Feet    Assistive device  None    Ambulation Surface  Level;Indoor                   [facility] Adult PT Treatment/Exercise - 06/20/18 1112      Ambulation/Gait   Stairs  Yes    Stairs Assistance  6: Modified independent (Device/Increase time)   pt had one mis-step, self  recovered   Stair Management Technique  One rail Left;Alternating pattern    Number of Stairs  4    Gait Comments  Patient provided gait with ball toss, marching with ball toss, walking passing ball R<>L with trunk and head rotation.    Gait for endurance performing 6 minutes nonstop, 4 minutes of continued ambulation.      Self-Care   Self-Care  Other Self-Care Comments    Other Self-Care  Comments   "I haven't been going up and down the stairs."  She notes concern about safety with steps.       Neuro Re-ed    Neuro Re-ed Details   Standing on foam with head motion + eyes open, faom with eyes closed.  The patient then performed rocker board with reactive balance strategies, rocker board with eyes closed, rocker board with head motion.  Gait activities turning to read cards for increased scanning and dec'd neck guarding.      Vestibular Treatment/Exercise - 06/20/18 1139      Vestibular Treatment/Exercise   Vestibular Treatment Provided  Gaze    Gaze Exercises  X1 Viewing Horizontal;X1 Viewing Vertical      X1 Viewing Horizontal   Foot Position  standing feet apart with letter 6 feet away.    Comments  60 seconds      X1 Viewing Vertical   Foot Position  standing feet apaart with letter 6 feet away.    Comments  60 seconds.                 PT Long Term Goals - 06/18/18 1135      PT LONG TERM GOAL #1   Title  Pt will be IND In HEP to improve dizziness, balance, and gait. TARGET DATE FOR ALL LTGS: 06/26/18    Time  6    Period  Weeks    Status  New      PT LONG TERM GOAL #2   Title  Pt will improve DGI to at least 19 and BERG to at least 46 to show improved balance and decreased risk for falls.     Baseline  DGI is 19/24 and Merrilee Jansky is 54/56.    Time  6    Period  Weeks    Status  Achieved      PT LONG TERM GOAL #3   Title  Pt will report average dizziness will be </=3/10 in order to perform ADLs in safe manner.     Baseline  20% of the time she gets dizziness worse  with turning in the kitchen and cooking.  In laundry room when moving clothes over or trying to put clean sheets on the bed.  Dizziness stays at or below a 3/10 (she now stops when she gets worsening symptoms).      Time  6    Period  Weeks    Status  Achieved      PT LONG TERM GOAL #4   Title  Pt will amb. 1000' over even/uneven terrain, IND, while performing head turns without dizziness incr. >/=1 point in order to improve functional mobility and safely garden.     Time  6    Period  Weeks    Status  New      PT LONG TERM GOAL #5   Title  Pt will improve LE strength to at least 4+/5 MMT bilat to improve function.     Time  6    Period  Weeks    Status  New            Plan - 06/20/18 1217    Clinical Impression Statement  The patient had to leave early today due to medical visit for bloodwork.  She is progressing well this week with PT encouraging she begin to return to steps at home (with husband's supervision initially), increase outdoor ambulation and return to gym routine as able.     PT Treatment/Interventions  Canalith Repostioning;Electrical Stimulation;Moist Heat;Gait training;Stair training;Functional mobility training;Therapeutic activities;Therapeutic exercise;Balance training;Neuromuscular re-education;Manual techniques;Passive range of motion;Energy conservation;Taping;Vestibular    PT Next Visit Plan  Check LTGs,  renew for 1-2 visits as indicated based on patient progress and status at next visit.      Consulted and Agree with Plan of Care  Patient       Patient will benefit from skilled therapeutic intervention in order to improve the following deficits and impairments:  Abnormal gait, Decreased activity tolerance, Cardiopulmonary status limiting activity, Decreased balance, Decreased coordination, Decreased endurance, Decreased safety awareness, Decreased strength, Difficulty walking, Postural dysfunction  Visit Diagnosis: Other abnormalities of gait and  mobility  Dizziness and giddiness  Muscle weakness (generalized)     Problem List Patient Active Problem List   Diagnosis Date Noted  . Chronic right shoulder pain 02/13/2017  . Short Achilles tendon (acquired), left ankle 02/13/2017  . Dizziness and giddiness 02/16/2016  . Monoallelic mutation of CHEK2 gene 02/02/2015  . Genetic testing 01/31/2015  . Altered mental status 10/25/2014  . Occipital neuralgia of left side   . 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 History of permanent cardiac pacemaker placement 10/07/2014  . Cephalalgia   . Complicated migraine   . HLD (hyperlipidemia)   . Headache 10/06/2014  . Arthritis of left hip 06/12/2013  . Status post THR (total hip replacement) 06/12/2013  . Constipation 05/25/2013  . Orthostatic hypotension 01/27/2013  . crosstalk-atrial lead 01/27/2013  . Pre-syncope 01/02/2013  . Sinus tachycardia 01/01/2013  . Second degree heart block 12/27/2012  . Hypothyroidism 12/27/2012  . Spigelian hernia 04/20/2011  . DYSPNEA 05/09/2010  . BREAST CANCER 05/05/2010  . ASTHMA 05/05/2010  . HIATAL HERNIA 05/05/2010  . DEGENERATIVE Disk DISEASE 05/05/2010  . OSTEOPENIA 05/05/2010    WEAVER,CHRISTINA ,PT 06/20/2018, 12:19 PM  [facility] [address] [place], [place], [address] Phone: [phone]   Fax:  [phone]  Name: GRACELYN COVENTRY MRN: [id number] Date of Birth: 1945-08-06

## 2018-06-21 LAB — LIPID PANEL
CHOLESTEROL TOTAL: 116 mg/dL (ref 100–199)
Chol/HDL Ratio: 2.5 ratio (ref 0.0–4.4)
HDL: 47 mg/dL (ref >39)
LDL CALC: 50 mg/dL (ref 0–99)
Triglycerides: 96 mg/dL (ref 0–149)
VLDL Cholesterol Cal: 19 mg/dL (ref 5–40)

## 2018-06-23 ENCOUNTER — Other Ambulatory Visit: Payer: Self-pay

## 2018-06-23 NOTE — Patient Outreach (Signed)
Second attempt to obtain mRs. No answer. Left message for return call on home phone.

## 2018-06-24 ENCOUNTER — Ambulatory Visit (INDEPENDENT_AMBULATORY_CARE_PROVIDER_SITE_OTHER): Payer: Medicare Other | Admitting: Allergy and Immunology

## 2018-06-24 ENCOUNTER — Encounter: Payer: Self-pay | Admitting: Allergy and Immunology

## 2018-06-24 VITALS — BP 120/64 | HR 66 | Resp 16 | Ht 67.0 in | Wt 163.4 lb

## 2018-06-24 DIAGNOSIS — K219 Gastro-esophageal reflux disease without esophagitis: Secondary | ICD-10-CM

## 2018-06-24 DIAGNOSIS — J3089 Other allergic rhinitis: Secondary | ICD-10-CM

## 2018-06-24 DIAGNOSIS — J4520 Mild intermittent asthma, uncomplicated: Secondary | ICD-10-CM

## 2018-06-24 MED ORDER — ALBUTEROL SULFATE HFA 108 (90 BASE) MCG/ACT IN AERS: 2.0000 | INHALATION_SPRAY | Freq: Four times a day (QID) | RESPIRATORY_TRACT | 1 refills | Status: DC | PRN

## 2018-06-24 MED ORDER — RABEPRAZOLE SODIUM 20 MG PO TBEC: 20.0000 mg | DELAYED_RELEASE_TABLET | Freq: Every day | ORAL | 2 refills | Status: DC

## 2018-06-24 MED ORDER — FLUTICASONE PROPIONATE 50 MCG/ACT NA SUSP: 1.0000 | Freq: Every day | NASAL | 1 refills | Status: DC

## 2018-06-24 NOTE — Patient Instructions (Addendum)
  1. Continue Aciphex 20 mg in a.m.  2. Continue cetirizine/Zyrtec 10 mg 1-2 times a day  3. Continue montelukast 10 mg one time per day  4. Continue OTC nasal fluticasone one spray each nostril one time per day  5. Can use albuterol HFA 2 inhalations every 4-6 hours if needed  6. Return 6 months or earlier if problem

## 2018-06-24 NOTE — Progress Notes (Signed)
Follow-up Note  Referring Provider: Burnard Bunting, MD Primary Provider: Burnard Bunting, MD Date of Office Visit: 06/24/2018  Subjective:   [name] (DOB: Apr 07, 1946) is a 73 y.o. female who returns to the [facility] on 06/24/2018 in re-evaluation of the following:  HPI: Meg presents to this clinic in evaluation of rhinitis and LPR.  Her last visit to this clinic was 25 December 2017.  She believes that she is doing very well at this point in time regarding her upper airway issue while using montelukast and some Flonase.  She has not required a systemic steroid or antibiotic to treat any type of respiratory tract issue.  Reflux is going quite well while using AcipHex.  During her last visit she discontinued her ranitidine and she has continued to do very well.  She has an albuterol inhaler which she rarely uses.  She has not had any significant issues revolving around asthma.  She did obtain the flu vaccine this year.  She informs me that she had a cerebrovascular accident treated with TPA prior to Thanksgiving 2019 that apparently affected her brainstem.  She is doing much better since that event although she does have some balance issues that she is working on rehab.  Allergies as of 06/24/2018      Reactions   Anesthetics, Amide Other (See Comments)   Patient unsure of names, however multiples cause swelling of airway & nausea   Clindamycin Swelling   Neomycin Swelling   Shellfish Allergy Other (See Comments)   Neurotoxic reaction   Sulfonamide Derivatives Anaphylaxis, Swelling   Zolpidem Tartrate Other (See Comments)   Jerking motions    Azithromycin Other (See Comments)   Severe gastritis    Bactroban Other (See Comments)   Causes sores in nose   Ciprofloxacin    REACTION: joint swelling   Codeine Swelling   Meperidine Hcl Nausea Only   Hallucinations   Morphine Nausea Only   Hallucinations    Neosporin  [neomycin-polymyxin-gramicidin] Hives, Dermatitis   All topical "orin's ointment"   Nitrofurantoin    REACTION: neuropathy in legs   Other    ALLERGIC TO WARM WATER SHELLFISH PT STATES PLEASE NOTE SHE CAN TAKE OXYCODONE AND TYLENOL FOR PAIN -NOT ALLERGIC   Penicillins Other (See Comments)   Swelling in joints   Tramadol    Makes jerk   Latex Rash      Medication List      albuterol 108 (90 Base) MCG/ACT inhaler Commonly known as:  PROVENTIL HFA;VENTOLIN HFA Inhale 2 puffs into the lungs every 6 (six) hours as needed. For shortness of breath   ALIGN 4 MG Caps Take 4 mg by mouth at bedtime.   aspirin 81 MG EC tablet Take 1 tablet (81 mg total) by mouth daily.   atorvastatin 40 MG tablet Commonly known as:  LIPITOR Take 1 tablet (40 mg total) by mouth daily at 6 PM.   b complex vitamins capsule Take 1 capsule by mouth daily.   cetirizine 10 MG tablet Commonly known as:  ZYRTEC Take 10 mg by mouth 2 (two) times daily.   cholecalciferol 1000 units tablet Commonly known as:  VITAMIN D Take 1,000 Units by mouth daily.   cycloSPORINE 0.05 % ophthalmic emulsion Commonly known as:  RESTASIS Place 1 drop into both eyes 2 (two) times daily as needed (unk).   escitalopram 10 MG tablet Commonly known as:  LEXAPRO Take 10 mg by mouth daily.   estradiol 0.025  MG/24HR Commonly known as:  VIVELLE-DOT Place 1 patch onto the skin 2 (two) times a week. Applies new patch on Sunday & Thursday   estradiol 2 MG vaginal ring Commonly known as:  ESTRING Place 2 mg vaginally every 3 (three) months. follow package directions   fluocinonide 0.05 % external solution Commonly known as:  LIDEX 1 APPLICATION ON THE SKIN AS DIRECTED. APPLY TO SCALP BID FOR 1-2 WEEKS PRN FLARE AS DIRECTED   fluticasone 50 MCG/ACT nasal spray Commonly known as:  FLONASE Place 1 spray into both nostrils daily.   levothyroxine 100 MCG tablet Commonly known as:  SYNTHROID, LEVOTHROID Take 1 tablet by  mouth daily.   Magnesium 500 MG Caps Take 1 capsule by mouth daily.   meclizine 25 MG tablet Commonly known as:  ANTIVERT Take 0.5 tablets (12.5 mg total) by mouth 3 (three) times daily.   Melatonin 1 MG Tabs Take 1.5 tablets by mouth at bedtime as needed.   midodrine 5 MG tablet Commonly known as:  PROAMATINE Take 1 tablet by mouth every 3 (three) hours. Takes 4-5 times daily from waking until no later than 7 pm.   montelukast 10 MG tablet Commonly known as:  SINGULAIR TAKE 1 TABLET DAILY   potassium chloride 10 MEQ tablet Commonly known as:  KLOR-CON M10 Take 1 tablet (10 mEq total) by mouth daily.   RABEprazole 20 MG tablet Commonly known as:  ACIPHEX Take 20 mg by mouth daily.   rosuvastatin 10 MG tablet Commonly known as:  CRESTOR Take 1 tablet (10 mg total) by mouth daily.   vitamin C 500 MG tablet Commonly known as:  ASCORBIC ACID Take 500 mg by mouth daily.       Past Medical History:  Diagnosis Date  . ADHD (attention deficit hyperactivity disorder)   . Allergy    trees/pollen, mold, fungus, dust mites. Takes allergy shots  . Arthritis    PAIN AND OA LEFT HIP  . Asthma    allergist Dr Olena Heckle- monthly allergy injections  . Autonomic dysfunction    CENTRAL NERVOUS SYSTEM NEUROPATHY - DX BY DR. Erling Cruz MORE THAN 10 YRS AGO - AND IT IS FELT TO CONTRIBUTE TO THE AUTOMIC  DYSFUNCTION-- PT HAS NUMBNESS LEGS AND FEET AND SOMETIIMES TIPS OF FINGER, SEVERE CONSTIPATION( NO SENSATION TO HAVE BM ), DOUBLE VISION, ORTHOSTATIC HYPOTENSION  . Blood transfusion   . Breast cancer ([place])    right side  . Bruises easily   . Cataract   . Chest pain    a. 12/2012 Cath: nl cors, EF 55-65%.  . Complication of anesthesia    reaction to some anesthetics/ 7/12 anesth record on chart- states prefers epidural  . CVA (cerebrovascular accident) ([place]) 03/21/2018  . Depression   . Diverticulosis   . Dysrhythmia    HX OF HIGH GRADE HEART BLOCK - REQUIRED PACEMAKER INSERTION  .  Esophageal stricture   . Gastritis   . GERD (gastroesophageal reflux disease)   . H/O hiatal hernia   . Hemorrhoids   . Hernia of abdominal wall    spigelian hernia RLQ - SURGERY TO REPAIR  . Hypothyroidism   . Interstitial cystitis   . Latex allergy, contact dermatitis   . Mallory - Weiss tear    HEALED   . Neuromuscular disorder (HCC)    central nervous system neuropathy- seen per Dr Erling Cruz  . Pacemaker   . Pericardial effusion    a. 12/2012 following ppm placement;  b. 01/01/2013 Echo: EF 55-60%,  small pericardial effusion w/o RV collapse-->No need for tap/window.  Marland Kitchen PONV (postoperative nausea and vomiting)    pt needs scop patch  . Recurrent upper respiratory infection (URI) 1/13- to present   bronchitis following surgery- states improved but still with cough. OV with Clearance Dr [name] 09/06/11 on chart  . Shortness of breath    AT TIMES - BUT MUCH IMPROVED AFTER PACEMAKER WAS REPROGRAMED.  Marland Kitchen Symptomatic bradycardia    a. 12/2012 s/p MDT dual chamber PPM, ser # [id number]; b. 12/2012 post-op course complicated by pericardial effusinon req lead revision.  . Thyroid disease     Past Surgical History:  Procedure Laterality Date  . ABDOMINAL HYSTERECTOMY  1974  . BACK SURGERY     cervical fusion 4-5 with plate  . BLADDER SUSPENSION    . BREAST BIOPSY  2002   NO BLOOD PRESSURES ON RIGHT SIDE/   s/p  axillary node dissection  . CYSTO WITH HYDRODISTENSION  09/07/2011   Procedure: CYSTOSCOPY/HYDRODISTENSION;  Surgeon: Ailene Rud, MD;  [facility]: [facility];  Service: Urology;  Laterality: N/A;  INSTILLATION OF MARCAINE/PYRIDIUM INSTILLATION OF MARCAINE/KENALOG  . CYSTOSCOPY  1975, 2007  . EYE SURGERY     LASIK EYE SURGERY BILATERAL  . LAPAROSCOPY  1973  . LEAD REVISION N/A 12/30/2012   Procedure: LEAD REVISION;  Surgeon: Evans Lance, MD;  Location[facility] CATH LAB;  Service: Cardiovascular;  Laterality: N/A;  . LEFT HEART CATHETERIZATION WITH CORONARY ANGIOGRAM N/A 12/29/2012    Procedure: LEFT HEART CATHETERIZATION WITH CORONARY ANGIOGRAM;  Surgeon: Peter M Martinique, MD;  Location: [facility] CATH LAB;  Service: Cardiovascular;  Laterality: N/A;  . MASTECTOMY MODIFIED RADICAL     right; with immediate reconstruction  . MYRINGOPLASTY  1962  . NECK SURGERY     c4-5 ruptured disc  . OOPHORECTOMY  1982  . PACEMAKER INSERTION    . PERMANENT PACEMAKER INSERTION N/A 12/29/2012   Procedure: PERMANENT PACEMAKER INSERTION;  Surgeon: Deboraha Sprang, MD;  Location: [facility] CATH LAB;  Service: Cardiovascular;  Laterality: N/A;  . RECTOCELE REPAIR     with cystocele repair  . TONSILLECTOMY    . TOTAL HIP ARTHROPLASTY Right   . TOTAL HIP ARTHROPLASTY Left 06/12/2013   Procedure: LEFT TOTAL HIP ARTHROPLASTY ANTERIOR APPROACH;  Surgeon: Mcarthur Rossetti, MD;  Location: [facility];  Service: Orthopedics;  Laterality: Left;  . TYMPANOPLASTY  1973   right  . ULNAR NERVE TRANSPOSITION Right   . VENTRAL HERNIA REPAIR  05/30/2011   Procedure: HERNIA REPAIR VENTRAL ADULT;  Surgeon: Haywood Lasso, MD;  Location: [facility];  Service: General;  Laterality: Right;  repair right spigelian hernia    Review of systems negative except as noted in HPI / PMHx or noted below:  Review of Systems  Constitutional: Negative.   HENT: Negative.   Eyes: Negative.   Respiratory: Negative.   Cardiovascular: Negative.   Gastrointestinal: Negative.   Genitourinary: Negative.   Musculoskeletal: Negative.   Skin: Negative.   Neurological: Negative.   Endo/Heme/Allergies: Negative.   Psychiatric/Behavioral: Negative.      Objective:   Vitals:   06/24/18 1503  BP: 120/64  Pulse: 66  Resp: 16  SpO2: 94%   Height: 5\' 7"  (170.2 cm)  Weight: 163 lb 6.4 oz (74.1 kg)   Physical Exam Constitutional:      Appearance: She is not diaphoretic.  HENT:     Head: Normocephalic.     Right Ear: Tympanic membrane, ear canal and  external ear normal.     Left Ear: Tympanic membrane, ear canal  and external ear normal.     Nose: Nose normal. No mucosal edema or rhinorrhea.     Mouth/Throat:     Pharynx: Uvula midline. No oropharyngeal exudate.  Eyes:     Conjunctiva/sclera: Conjunctivae normal.  Neck:     Thyroid: No thyromegaly.     Trachea: Trachea normal. No tracheal tenderness or tracheal deviation.  Cardiovascular:     Rate and Rhythm: Normal rate and regular rhythm.     Heart sounds: Normal heart sounds, S1 normal and S2 normal. No murmur.  Pulmonary:     Effort: No respiratory distress.     Breath sounds: Normal breath sounds. No stridor. No wheezing or rales.  Lymphadenopathy:     Head:     Right side of head: No tonsillar adenopathy.     Left side of head: No tonsillar adenopathy.     Cervical: No cervical adenopathy.  Skin:    Findings: No erythema or rash.     Nails: There is no clubbing.   Neurological:     Mental Status: She is alert.     Diagnostics:    Spirometry was performed and demonstrated an FEV1 of 2.11 at 86 % of predicted.  The patient had an Asthma Control Test with the following results: ACT Total Score: 23.    Assessment and Plan:   1. Other allergic rhinitis   2. LPRD (laryngopharyngeal reflux disease)   3. Asthma, mild intermittent, well-controlled     1. Continue Aciphex 20 mg in a.m.  2. Continue cetirizine/Zyrtec 10 mg 1-2 times a day  3. Continue montelukast 10 mg one time per day  4. Continue OTC nasal fluticasone one spray each nostril one time per day  5. Can use albuterol HFA 2 inhalations every 4-6 hours if needed  6. Return 6 months or earlier if problem  Meg is really doing very well on a minimal collection of medical therapy directed against respiratory tract inflammation and reflux and she will continue to utilize this plan and I will see her back in this clinic in 6 months or earlier if there is a problem.  Allena Katz, MD Allergy / Immunology [place]

## 2018-06-25 ENCOUNTER — Telehealth: Payer: Self-pay | Admitting: *Deleted

## 2018-06-25 ENCOUNTER — Encounter: Payer: Self-pay | Admitting: Allergy and Immunology

## 2018-06-25 NOTE — Telephone Encounter (Signed)
Patient husband called back stating patient doesn't want to use Flonase and will use OTC nasal spray.

## 2018-06-25 NOTE — Telephone Encounter (Signed)
Called and left voicemail asking to return call to discuss.  

## 2018-06-25 NOTE — Telephone Encounter (Signed)
Patient's husband called with questions regarding the prescription that Dr Neldon Mc sent in yesterday. Please advise

## 2018-06-27 ENCOUNTER — Other Ambulatory Visit: Payer: Self-pay

## 2018-06-27 NOTE — Patient Outreach (Signed)
Telephone outreach to patient to obtain mRs was successfully completed. mRs= 1. 

## 2018-07-02 ENCOUNTER — Telehealth: Payer: Self-pay | Admitting: Adult Health

## 2018-07-02 NOTE — Telephone Encounter (Signed)
Pt states she received a response from [facility] that they are waiting to hear from NP Janett Billow re: a resell on pt's rosuvastatin (CRESTOR) 10 MG tablet.  Please call

## 2018-07-03 ENCOUNTER — Other Ambulatory Visit: Payer: Self-pay

## 2018-07-03 MED ORDER — ROSUVASTATIN CALCIUM 10 MG PO TABS: 10.0000 mg | ORAL_TABLET | Freq: Every day | ORAL | 3 refills | Status: DC

## 2018-07-03 MED ORDER — ROSUVASTATIN CALCIUM 10 MG PO TABS: 10.0000 mg | ORAL_TABLET | Freq: Every day | ORAL | 0 refills | Status: AC

## 2018-07-03 NOTE — Telephone Encounter (Signed)
I called patient that refill [facility] to [facility] crest[facility]. Pt stated [facility]  stated they contacted Janett Billow NP and have not heard anything. I stated all refill phone calls and request are done by the nursing staff. I stated there was no phone call or fax from [facility] as of today. I advised pt a [facility]ill was [facility] today.Pt verbalized understanding.

## 2018-07-03 NOTE — Telephone Encounter (Signed)
Unsure what exactly [facility] is requesting regarding "resell" on rosuvastatin.  Please call patient for [facility] for further verification

## 2018-07-04 ENCOUNTER — Ambulatory Visit: Payer: Medicare Other | Admitting: Physical Therapy

## 2018-07-07 ENCOUNTER — Ambulatory Visit: Payer: Medicare Other | Admitting: Rehabilitative and Restorative Service Providers"

## 2018-07-08 NOTE — Telephone Encounter (Signed)
Error

## 2018-07-10 ENCOUNTER — Ambulatory Visit: Payer: Medicare Other | Admitting: Rehabilitative and Restorative Service Providers"

## 2018-07-10 DIAGNOSIS — M6281 Muscle weakness (generalized): Secondary | ICD-10-CM

## 2018-07-10 DIAGNOSIS — R2689 Other abnormalities of gait and mobility: Secondary | ICD-10-CM | POA: Diagnosis not present

## 2018-07-10 DIAGNOSIS — R42 Dizziness and giddiness: Secondary | ICD-10-CM

## 2018-07-10 NOTE — Therapy (Signed)
[facility] [address] [place], [place], [address] Phone: [phone]   Fax:  [phone]  Physical Therapy Treatment and Discharge summary  Patient Details  Name: [name] MRN: [id number] Date of Birth: 18-Dec-1945 Referring Provider (PT): Tanja Port NP   Encounter Date: 07/10/2018  PT End of Session - 07/10/18 1120    Visit Number  8    Number of Visits  12    Date for PT Re-Evaluation  06/26/18    Authorization Type  Medicare and T[employer] for Life - NO PTA.  10th visit PN    PT Start Time  1104    PT Stop Time  1128    PT Time Calculation (min)  24 min    Activity Tolerance  Patient tolerated treatment well    Behavior During Therapy  WFL for tasks assessed/performed       Past Medical History:  Diagnosis Date  . ADHD (attention deficit hyperactivity disorder)   . Allergy    trees/pollen, mold, fungus, dust mites. Takes allergy shots  . Arthritis    PAIN AND OA LEFT HIP  . Asthma    allergist Dr Olena Heckle- monthly allergy injections  . Autonomic dysfunction    CENTRAL NERVOUS SYSTEM NEUROPATHY - DX BY DR. Erling Cruz MORE THAN 10 YRS AGO - AND IT IS FELT TO CONTRIBUTE TO THE AUTOMIC  DYSFUNCTION-- PT HAS NUMBNESS LEGS AND FEET AND SOMETIIMES TIPS OF FINGER, SEVERE CONSTIPATION( NO SENSATION TO HAVE BM ), DOUBLE VISION, ORTHOSTATIC HYPOTENSION  . Blood transfusion   . Breast cancer ([facility])    right side  . Bruises easily   . Cataract   . Chest pain    a. 12/2012 Cath: nl cors, EF 55-65%.  . Complication of anesthesia    reaction to some anesthetics/ 7/12 anesth record on chart- states prefers epidural  . CVA (cerebrovascular accident) ([place]) 03/21/2018  . Depression   . Diverticulosis   . Dysrhythmia    HX OF HIGH GRADE HEART BLOCK - REQUIRED PACEMAKER INSERTION  . Esophageal stricture   . Gastritis   . GERD (gastroesophageal reflux disease)   . H/O hiatal hernia   . Hemorrhoids   . Hernia of  abdominal wall    spigelian hernia RLQ - SURGERY TO REPAIR  . Hypothyroidism   . Interstitial cystitis   . Latex allergy, contact dermatitis   . Mallory - Weiss tear    HEALED   . Neuromuscular disorder ([facility])    central nervous system neuropathy- seen per Dr Erling Cruz  . Pacemaker   . Pericardial effusion    a. 12/2012 following ppm placement;  b. 01/01/2013 Echo: EF 55-60%, small pericardial effusion w/o RV collapse-->No need for tap/window.  Marland Kitchen PONV (postoperative nausea and vomiting)    pt needs scop patch  . Recurrent upper respiratory infection (URI) 1/13- to present   bronchitis following surgery- states improved but still with cough. OV with [facility] Dr [name] 09/06/11 on chart  . Shortness of breath    AT TIMES - BUT MUCH IMPROVED AFTER PACEMAKER WAS REPROGRAMED.  Marland Kitchen Symptomatic bradycardia    a. 12/2012 s/p MDT dual chamber PPM, ser # [id number]; b. 12/2012 post-op course complicated by pericardial effusinon req lead revision.  . Thyroid disease     Past Surgical History:  Procedure Laterality Date  . ABDOMINAL HYSTERECTOMY  1974  . BACK SURGERY     cervical fusion 4-5 with plate  . BLADDER SUSPENSION    .  BREAST BIOPSY  2002   NO BLOOD PRESSURES ON RIGHT SIDE/   s/p  axillary node dissection  . CYSTO WITH HYDRODISTENSION  09/07/2011   Procedure: CYSTOSCOPY/HYDRODISTENSION;  Surgeon: Ailene Rud, MD;  Location: [facility];  Service: Urology;  Laterality: N/A;  INSTILLATION OF MARCAINE/PYRIDIUM INSTILLATION OF MARCAINE/KENALOG  . CYSTOSCOPY  1975, 2007  . EYE SURGERY     LASIK EYE SURGERY BILATERAL  . LAPAROSCOPY  1973  . LEAD REVISION N/A 12/30/2012   Procedure: LEAD REVISION;  Surgeon: Evans Lance, MD;  Location[facility] CATH LAB;  Service: Cardiovascular;  Laterality: N/A;  . LEFT HEART CATHETERIZATION WITH CORONARY ANGIOGRAM N/A 12/29/2012   Procedure: LEFT HEART CATHETERIZATION WITH CORONARY ANGIOGRAM;  Surgeon: Peter M Martinique, MD;  Location: [facility] CATH LAB;  Service:  Cardiovascular;  Laterality: N/A;  . MASTECTOMY MODIFIED RADICAL     right; with immediate reconstruction  . MYRINGOPLASTY  1962  . NECK SURGERY     c4-5 ruptured disc  . OOPHORECTOMY  1982  . PACEMAKER INSERTION    . PERMANENT PACEMAKER INSERTION N/A 12/29/2012   Procedure: PERMANENT PACEMAKER INSERTION;  Surgeon: Deboraha Sprang, MD;  Location[facility] C[facility] LAB;  Service: Cardiovascular;  Laterality: N/A;  . RECTOCELE REPAIR     with cystocele repair  . TONSILLECTOMY    . TOTAL HIP ARTHROPLASTY Right   . TOTAL HIP ARTHROPLASTY Left 06/12/2013   Procedure: LEFT TOTAL HIP ARTHROPLASTY ANTERIOR APPROACH;  Surgeon: Mcarthur Rossetti, MD;  Location: [facility];  Service: Orthopedics;  Laterality: Left;  . TYMPANOPLASTY  1973   right  . ULNAR NERVE TRANSPOSITION Right   . VENTRAL HERNIA REPAIR  05/30/2011   Procedure: HERNIA REPAIR VENTRAL ADULT;  Surgeon: Haywood Lasso, MD;  Location: [facility];  Service: General;  Laterality: Right;  repair right spigelian hernia    There were no vitals filed for this visit.  Subjective Assessment - 07/10/18 1103    Subjective  "I feel like I've made great progress."  She missed last week due to therapist being sick.    The only thing she notes she cannot do well is stand on one leg.  She has tried to return to walking and can tolerate 1 mile.      Pertinent History  PMH: TIA, pacemaker, cerv fusion 4-5,Rt and LT THA, CNS neruopathy, Rt side breast CA,BPPV, orthostatic hypotension,DOE,HLD,HTN    Patient Stated Goals  get back to baseline of walking up to 3 miles 3 times per week, improve balance and dizziness    Currently in Pain?  No/denies         [facility] PT Assessment - 07/10/18 1107      ROM / Strength   AROM / PROM / Strength  Strength      Strength   Overall Strength  Within functional limits for tasks performed                   [facility] Adult PT Treatment/Exercise - 07/10/18 1311      Self-Care   Self-Care   Other Self-Care Comments    Other Self-Care Comments   Discussed progression of walking program.  Patient has returned to driving short distances without difficulty.      Neuro Re-ed    Neuro Re-ed Details   Reviewed prior HEP and d/c'd head nods + head rotation romberg for HEP as it is no longer challenging her.  Then performed VOR x 1 viewing in horizontal and  vertical planes.  Recommended continuation of tandem gait and single leg stance activities.                  PT Long Term Goals - 07/10/18 1106      PT LONG TERM GOAL #1   Title  Pt will be IND In HEP to improve dizziness, balance, and gait. TARGET DATE FOR ALL LTGS: 06/26/18    Time  6    Period  Weeks    Status  Achieved      PT LONG TERM GOAL #2   Title  Pt will improve DGI to at least 19 and BERG to at least 46 to show improved balance and decreased risk for falls.     Baseline  DGI is 19/24 and Merrilee Jansky is 54/56.    Time  6    Period  Weeks    Status  Achieved      PT LONG TERM GOAL #3   Title  Pt will report average dizziness will be </=3/10 in order to perform ADLs in safe manner.     Baseline  20% of the time she gets dizziness worse with turning in the kitchen and cooking.  In laundry room when moving clothes over or trying to put clean sheets on the bed.  Dizziness stays at or below a 3/10 (she now stops when she gets worsening symptoms).      Time  6    Period  Weeks    Status  Achieved      PT LONG TERM GOAL #4   Title  Pt will amb. 1000' over even/uneven terrain, IND, while performing head turns without dizziness incr. >/=1 point in order to improve functional mobility and safely garden.     Baseline  Due to cold weather, did not officially test.  Patient notes she walks through grass and is back to 1 mile of walking.    Time  6    Period  Weeks    Status  Achieved      PT LONG TERM GOAL #5   Title  Pt will improve LE strength to at least 4+/5 MMT bilat to improve function.     Baseline  5/5  throughout    Time  6    Period  Weeks    Status  Achieved            Plan - 07/10/18 1312    Clinical Impression Statement  The patient met all LTGs and notes contnued progression of mobility with return to home walking and driving.   Discharge from PT today.    PT Treatment/Interventions  Canalith Repostioning;Electrical Stimulation;Moist Heat;Gait training;Stair training;Functional mobility training;Therapeutic activities;Therapeutic exercise;Balance training;Neuromuscular re-education;Manual techniques;Passive range of motion;Energy conservation;Taping;Vestibular    PT Next Visit Plan  discharge today.    Consulted and Agree with Plan of Care  Patient       Patient will benefit from skilled therapeutic intervention in order to improve the following deficits and impairments:  Abnormal gait, Decreased activity tolerance, Cardiopulmonary status limiting activity, Decreased balance, Decreased coordination, Decreased endurance, Decreased safety awareness, Decreased strength, Difficulty walking, Postural dysfunction  Visit Diagnosis: Other abnormalities of gait and mobility  Dizziness and giddiness  Muscle weakness (generalized)    PHYSICAL THERAPY DISCHARGE SUMMARY  Visits from Start of Care: 8  Current functional level related to goals / functional outcomes: See above   Remaining deficits: Patient feels she is at prior baseline status.   Education / Equipment: Home  program, home walking, yoga resources for community.  Plan: Patient agrees to discharge.  Patient goals were met. Patient is being discharged due to meeting the stated rehab goals.  ?????          Thank you for the referral of this patient. Rudell Cobb, MPT  [place] 07/10/2018, 1:13 PM  [facility] [address] [place], [place], [address] Phone: [phone]   Fax:  [phone]  Name: [name] MRN:  [id number] Date of Birth: December 25, 1945

## 2018-07-10 NOTE — Patient Instructions (Signed)
Access Code: [email or web address]  URL: [email or web address]  Date: 07/10/2018  Prepared by: Rudell Cobb   Program Notes  Walking: Return to walking 30-45 minutes.   Exercises Standing Tandem Balance with Counter Support - 3 reps - 1 sets - 30 hold - 1x daily - 6x weekly Single Leg Stance - 3 reps - 1 sets - 10 seconds hold - 1x daily - 7x weekly Standing Gaze Stabilization with Head Nod - 1 reps - 1 sets - 3x daily - 7x weekly Standing Gaze Stabilization with Head Rotation - 2 sets - 1x daily - 7x weekly

## 2018-07-15 DIAGNOSIS — H5111 Convergence insufficiency: Secondary | ICD-10-CM | POA: Diagnosis not present

## 2018-07-15 DIAGNOSIS — H518 Other specified disorders of binocular movement: Secondary | ICD-10-CM | POA: Diagnosis not present

## 2018-07-15 DIAGNOSIS — H532 Diplopia: Secondary | ICD-10-CM | POA: Diagnosis not present

## 2018-08-04 ENCOUNTER — Telehealth (INDEPENDENT_AMBULATORY_CARE_PROVIDER_SITE_OTHER): Payer: Medicare Other | Admitting: Adult Health

## 2018-08-04 ENCOUNTER — Other Ambulatory Visit: Payer: Self-pay

## 2018-08-04 ENCOUNTER — Telehealth: Payer: Self-pay

## 2018-08-04 DIAGNOSIS — E785 Hyperlipidemia, unspecified: Secondary | ICD-10-CM | POA: Diagnosis not present

## 2018-08-04 DIAGNOSIS — H532 Diplopia: Secondary | ICD-10-CM

## 2018-08-04 DIAGNOSIS — R2689 Other abnormalities of gait and mobility: Secondary | ICD-10-CM

## 2018-08-04 DIAGNOSIS — G459 Transient cerebral ischemic attack, unspecified: Secondary | ICD-10-CM

## 2018-08-04 NOTE — Progress Notes (Addendum)
Virtual Visit via Telephone Note  I connected with [name] on 08/04/18 at  1:45 PM EDT by telephone and verified that I am speaking with the correct person using two identifiers. Location of patient in her own home. My current location [facility] office.    I discussed the limitations, risks, security and privacy concerns of performing an evaluation and management service by telephone and the availability of in person appointments. I also discussed with the patient that there may be a patient responsible charge related to this service. The patient expressed understanding and agreed to proceed.   History of Present Illness:  [name] is a 73 year old female with underlying medical history significant for pacemaker, CNS neuropathy, right-sided breast cancer, hypothyroidism, BPPV,complicated migraine, HLD, HTN and right brainstem infarct (unable to see on MRI) s/p IV TPA vs TIA in 03/2018.  She was initially scheduled for face-to-face office visit today at this time but due to [place], face-to-face office visit rescheduled for non-face-to-face telephone visit.  At prior visit on 05/05/2018, she did have concerns post stroke of slightly worsening chronic vertigo and diplopia.  She states overall she has been doing exceptionally well with a "tremendous" improvement of her overall dizziness and vestibular issues.  She has completed physical therapy but plans on participating in another therapy class when she is able to go depending on future restrictions with COVID19.  She does endorse continuing to be active and exercising when able.  She was evaluated by neuro-ophthalmology and did not recommend any treatment options but likely due to vestibular issues and recommended evaluation by neuro toxicologist at [facility].  This evaluation was put on hold due to [place] but plans on following up with them once able.  She has continued on rosuvastatin without reported myalgias  and recent lipid panel showed satisfactory levels with LDL 50.  She continues on aspirin 81 mg with mild bruising but no bleeding.  She has no additional concerns at this time and is very pleased with the progress she has made thus far and ongoing progress she plans on making in the future.  Denies new or worsening stroke/TIA symptoms.     Observations/Objective:  14 system review of systems performed and negative with exception of intermittent dizziness  Assessment and Plan:  [name] is a 73 year old Caucasian female who unfortunately experienced possible right brainstem infarct unable to see an MRI s/p TPA vs TIA.  Vascular risk factors include migraine and HLD.  She has been stable from a stroke standpoint without new or worsening stroke/TIA symptoms and reported improvement regarding chronic vestibular concerns.  1. Right brainstem infarct versus TIA: Continue aspirin 81 mg daily   and rosuvastatin for secondary stroke prevention.  Maintain strict control of hypertension with blood pressure goal below 130/90, diabetes with hemoglobin A1c goal below 6.5% and cholesterol with LDL cholesterol (bad cholesterol) goal below 70 mg/dL.  I also advised the patient to eat a healthy diet with plenty of whole grains, cereals, fruits and vegetables, exercise regularly with at least 30 minutes of continuous activity daily and maintain ideal body weight. 2. HLD: Advised to continue current treatment regimen along with continued follow-up with PCP for future prescribing and monitoring of lipid panel. 3. Vestibular issues: Highly encouraged continuing exercise when able with social avoidance at this time.  Encourage participation in additional therapy classes when she is able.  Neuro-ophthalmology recommended evaluation by neuro toxicologist at [facility] and if interested in the future, advised her  to follow with PCP for referral 4. Highly encouraged monitoring blood pressure at home as she is  currently on midodrine along with moderate salt intake to avoid hypotension.  She will continue to follow with her cardiologist for ongoing monitoring and management   Follow Up Instructions:  As she has been stable from a stroke standpoint and continues to be followed closely by her PCP and cardiologist, she can follow-up as needed.  Did advise her to call office with any questions or concerns regarding her stroke or potential need for future follow-up.  She verbalized understanding.    I discussed the assessment and treatment plan with the patient. The patient was provided an opportunity to ask questions and all were answered. The patient agreed with the plan and demonstrated an understanding of the instructions.   The patient was advised to call back or seek an in-person evaluation if the symptoms worsen or if the condition fails to improve as anticipated.  I provided 15 minutes of non-face-to-face time during this encounter.   Venancio Poisson, AGNP-BC  [facility] [address] [place], [place] [address]  Phone [phone] Fax [phone] Note: This document was prepared with digital dictation and possible smart phrase technology. Any transcriptional errors that result from this process are unintentional.

## 2018-08-04 NOTE — Progress Notes (Signed)
Agree with above note

## 2018-08-04 NOTE — Telephone Encounter (Signed)
I called pt about doing a telephone visit at 145pm.Pt stated she is doing well and not having any issues. Pt gave consent with telephone visit and consent to file insurance.

## 2018-09-01 DIAGNOSIS — E038 Other specified hypothyroidism: Secondary | ICD-10-CM | POA: Diagnosis not present

## 2018-09-01 DIAGNOSIS — E7849 Other hyperlipidemia: Secondary | ICD-10-CM | POA: Diagnosis not present

## 2018-09-03 ENCOUNTER — Ambulatory Visit (INDEPENDENT_AMBULATORY_CARE_PROVIDER_SITE_OTHER): Payer: Medicare Other | Admitting: *Deleted

## 2018-09-03 ENCOUNTER — Other Ambulatory Visit: Payer: Self-pay

## 2018-09-03 DIAGNOSIS — I442 Atrioventricular block, complete: Secondary | ICD-10-CM

## 2018-09-03 LAB — CUP PACEART REMOTE DEVICE CHECK
Battery Remaining Longevity: 45 mo
Battery Voltage: 2.98 V
Brady Statistic AP VP Percent: 26 %
Brady Statistic AP VS Percent: 0 %
Brady Statistic AS VP Percent: 73.96 %
Brady Statistic AS VS Percent: 0.04 %
Brady Statistic RA Percent Paced: 25.94 %
Brady Statistic RV Percent Paced: 99.78 %
Date Time Interrogation Session: 20200422121605
Implantable Lead Implant Date: 20140818
Implantable Lead Implant Date: 20140818
Implantable Lead Location: 753859
Implantable Lead Location: 753860
Implantable Lead Model: 5076
Implantable Lead Model: 5076
Implantable Pulse Generator Implant Date: 20140818
Lead Channel Impedance Value: 323 Ohm
Lead Channel Impedance Value: 456 Ohm
Lead Channel Impedance Value: 570 Ohm
Lead Channel Impedance Value: 608 Ohm
Lead Channel Pacing Threshold Amplitude: 0.625 V
Lead Channel Pacing Threshold Amplitude: 0.875 V
Lead Channel Pacing Threshold Pulse Width: 0.4 ms
Lead Channel Pacing Threshold Pulse Width: 0.4 ms
Lead Channel Sensing Intrinsic Amplitude: 1.375 mV
Lead Channel Sensing Intrinsic Amplitude: 1.375 mV
Lead Channel Sensing Intrinsic Amplitude: 24.625 mV
Lead Channel Sensing Intrinsic Amplitude: 24.625 mV
Lead Channel Setting Pacing Amplitude: 2 V
Lead Channel Setting Pacing Amplitude: 2.5 V
Lead Channel Setting Pacing Pulse Width: 0.4 ms
Lead Channel Setting Sensing Sensitivity: 0.9 mV

## 2018-09-04 DIAGNOSIS — R82998 Other abnormal findings in urine: Secondary | ICD-10-CM | POA: Diagnosis not present

## 2018-09-08 DIAGNOSIS — K219 Gastro-esophageal reflux disease without esophagitis: Secondary | ICD-10-CM | POA: Diagnosis not present

## 2018-09-08 DIAGNOSIS — K449 Diaphragmatic hernia without obstruction or gangrene: Secondary | ICD-10-CM | POA: Diagnosis not present

## 2018-09-08 DIAGNOSIS — M545 Low back pain: Secondary | ICD-10-CM | POA: Diagnosis not present

## 2018-09-08 DIAGNOSIS — H8193 Unspecified disorder of vestibular function, bilateral: Secondary | ICD-10-CM | POA: Diagnosis not present

## 2018-09-08 DIAGNOSIS — E039 Hypothyroidism, unspecified: Secondary | ICD-10-CM | POA: Diagnosis not present

## 2018-09-08 DIAGNOSIS — E785 Hyperlipidemia, unspecified: Secondary | ICD-10-CM | POA: Diagnosis not present

## 2018-09-08 DIAGNOSIS — Z1331 Encounter for screening for depression: Secondary | ICD-10-CM | POA: Diagnosis not present

## 2018-09-08 DIAGNOSIS — I480 Paroxysmal atrial fibrillation: Secondary | ICD-10-CM | POA: Diagnosis not present

## 2018-09-08 DIAGNOSIS — M5412 Radiculopathy, cervical region: Secondary | ICD-10-CM | POA: Diagnosis not present

## 2018-09-08 DIAGNOSIS — Z950 Presence of cardiac pacemaker: Secondary | ICD-10-CM | POA: Diagnosis not present

## 2018-09-08 DIAGNOSIS — I639 Cerebral infarction, unspecified: Secondary | ICD-10-CM | POA: Diagnosis not present

## 2018-09-08 DIAGNOSIS — G909 Disorder of the autonomic nervous system, unspecified: Secondary | ICD-10-CM | POA: Diagnosis not present

## 2018-09-08 DIAGNOSIS — Z0000 Encounter for general adult medical examination without abnormal findings: Secondary | ICD-10-CM | POA: Diagnosis not present

## 2018-09-08 DIAGNOSIS — Z1339 Encounter for screening examination for other mental health and behavioral disorders: Secondary | ICD-10-CM | POA: Diagnosis not present

## 2018-09-11 NOTE — Progress Notes (Signed)
Remote pacemaker transmission.   

## 2018-09-17 ENCOUNTER — Other Ambulatory Visit: Payer: Self-pay | Admitting: Adult Health

## 2018-09-30 DIAGNOSIS — I951 Orthostatic hypotension: Secondary | ICD-10-CM | POA: Diagnosis not present

## 2018-10-20 DIAGNOSIS — K52831 Collagenous colitis: Secondary | ICD-10-CM | POA: Diagnosis not present

## 2018-10-20 DIAGNOSIS — R197 Diarrhea, unspecified: Secondary | ICD-10-CM | POA: Diagnosis not present

## 2018-10-20 DIAGNOSIS — E785 Hyperlipidemia, unspecified: Secondary | ICD-10-CM | POA: Diagnosis not present

## 2018-10-27 DIAGNOSIS — K52831 Collagenous colitis: Secondary | ICD-10-CM | POA: Diagnosis not present

## 2018-10-28 DIAGNOSIS — R197 Diarrhea, unspecified: Secondary | ICD-10-CM | POA: Diagnosis not present

## 2018-11-18 DIAGNOSIS — K219 Gastro-esophageal reflux disease without esophagitis: Secondary | ICD-10-CM | POA: Diagnosis not present

## 2018-11-18 DIAGNOSIS — K52831 Collagenous colitis: Secondary | ICD-10-CM | POA: Diagnosis not present

## 2018-12-03 ENCOUNTER — Ambulatory Visit (INDEPENDENT_AMBULATORY_CARE_PROVIDER_SITE_OTHER): Payer: Medicare Other | Admitting: *Deleted

## 2018-12-03 DIAGNOSIS — R55 Syncope and collapse: Secondary | ICD-10-CM

## 2018-12-03 DIAGNOSIS — I441 Atrioventricular block, second degree: Secondary | ICD-10-CM

## 2018-12-04 ENCOUNTER — Telehealth: Payer: Self-pay

## 2018-12-04 LAB — CUP PACEART REMOTE DEVICE CHECK
Battery Remaining Longevity: 46 mo
Battery Voltage: 2.97 V
Brady Statistic AP VP Percent: 21.59 %
Brady Statistic AP VS Percent: 0 %
Brady Statistic AS VP Percent: 78.37 %
Brady Statistic AS VS Percent: 0.04 %
Brady Statistic RA Percent Paced: 21.54 %
Brady Statistic RV Percent Paced: 99.79 %
Date Time Interrogation Session: 20200723161352
Implantable Lead Implant Date: 20140818
Implantable Lead Implant Date: 20140818
Implantable Lead Location: 753859
Implantable Lead Location: 753860
Implantable Lead Model: 5076
Implantable Lead Model: 5076
Implantable Pulse Generator Implant Date: 20140818
Lead Channel Impedance Value: 342 Ohm
Lead Channel Impedance Value: 475 Ohm
Lead Channel Impedance Value: 570 Ohm
Lead Channel Impedance Value: 589 Ohm
Lead Channel Pacing Threshold Amplitude: 0.5 V
Lead Channel Pacing Threshold Amplitude: 0.75 V
Lead Channel Pacing Threshold Pulse Width: 0.4 ms
Lead Channel Pacing Threshold Pulse Width: 0.4 ms
Lead Channel Sensing Intrinsic Amplitude: 17.25 mV
Lead Channel Sensing Intrinsic Amplitude: 2 mV
Lead Channel Setting Pacing Amplitude: 2 V
Lead Channel Setting Pacing Amplitude: 2.5 V
Lead Channel Setting Pacing Pulse Width: 0.4 ms
Lead Channel Setting Sensing Sensitivity: 0.9 mV

## 2018-12-04 NOTE — Telephone Encounter (Signed)
Left message for patient to remind of missed remote transmission.  

## 2018-12-11 IMAGING — CT CT ANGIO NECK
2 of 7 series · 8 of 33 positions shown · IV contrast (OMNI 350)
Comparison: Head CT without contrast 8055 hours today. CTA head and
neck 10/07/2014.

CLINICAL DATA: 72-year-old female code stroke presentation with
right facial droop and dizziness.

EXAM:
CT ANGIOGRAPHY HEAD AND NECK
TECHNIQUE: Multidetector CT imaging of the head and neck was performed using
the standard protocol during bolus administration of intravenous
contrast. Multiplanar CT image reconstructions and MIPs were
obtained to evaluate the vascular anatomy. Carotid stenosis
measurements (when applicable) are obtained utilizing NASCET
criteria, using the distal internal carotid diameter as the
denominator.
CONTRAST:  50mL [id number]-[id number] IOPAMIDOL ([id number]-[id number]) INJECTION 76%

[Series 5: cta neck · axial · 0.33mm/px · z∈[-253,-135]mm · 2 of 178 slices shown]
[im 60/178  soft-tissue]
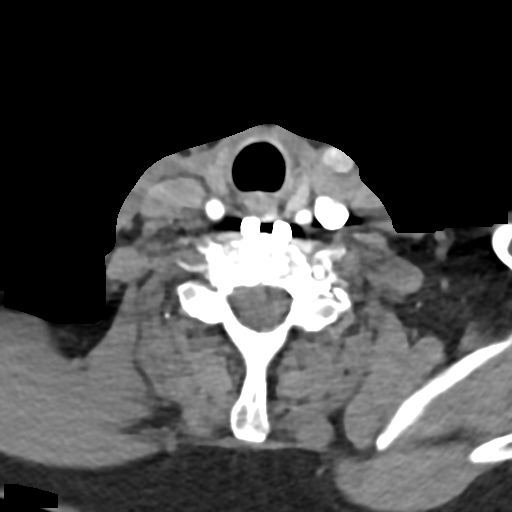
[im 119/178  soft-tissue]
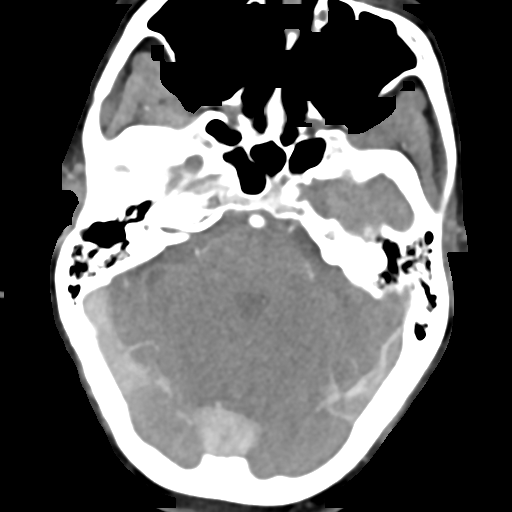

[Series 7: cta neck axial · axial · 0.50mm/px · z∈[-320,-68]mm · 6 of 352 slices shown]
[im 51/352  soft-tissue]
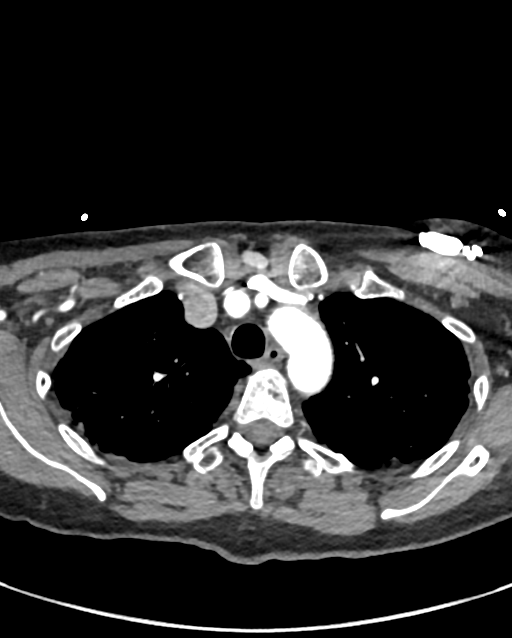
[im 101/352  bone]
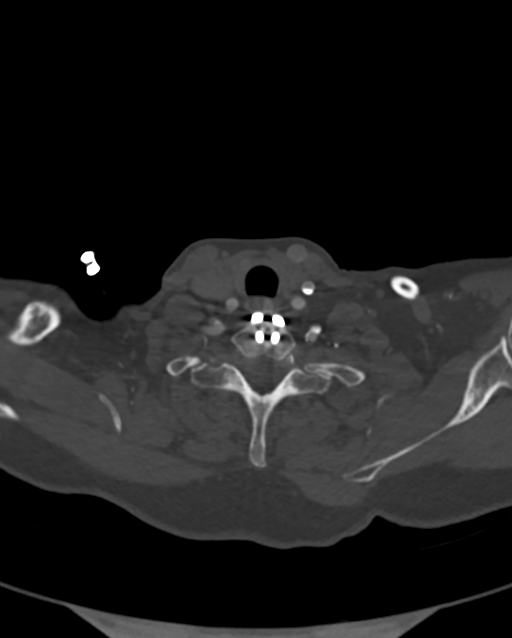
[im 151/352  soft-tissue]
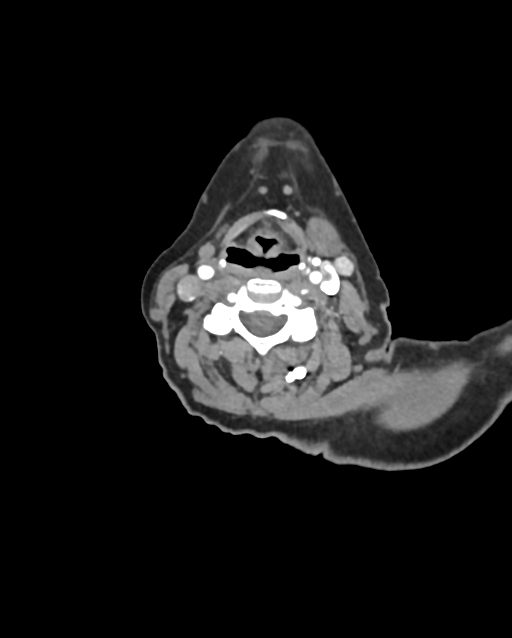
[im 201/352  bone]
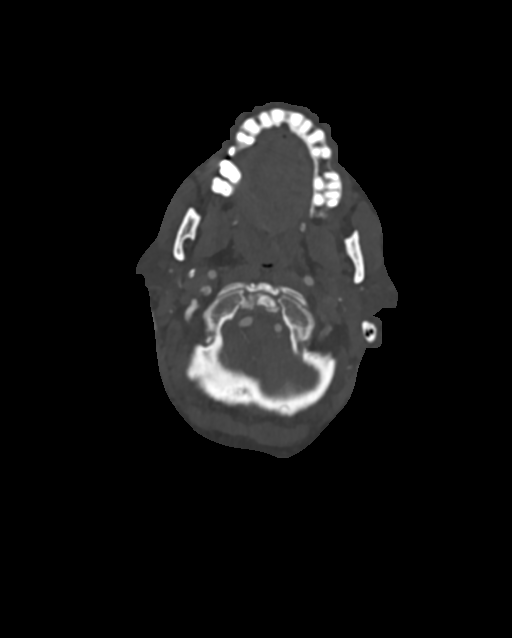
[im 251/352  soft-tissue]
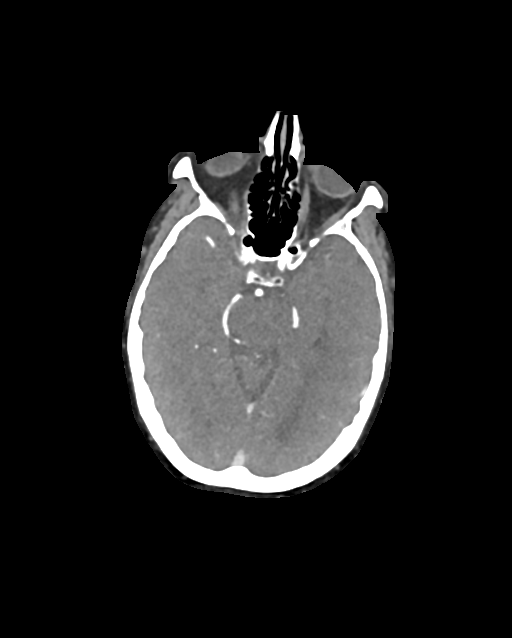
[im 301/352  bone]
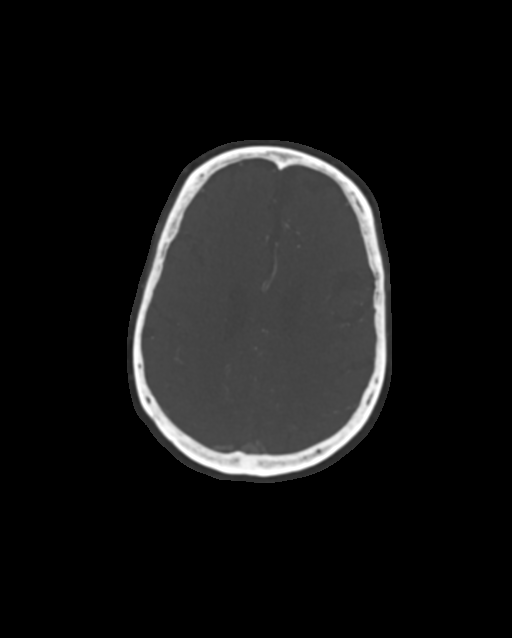

[8 of 33 positions shown; findings below may reference images not displayed]

FINDINGS: CTA NECK

Skeleton: No acute osseous abnormality identified. Chronic cervical
ACDF.

Upper chest: Stable apical lung scarring. No superior mediastinal
lymphadenopathy. Stable small volume likely physiologic superior
pericardial recess fluid. Partially visible left chest cardiac
pacemaker.

Other neck: Stable. Partial atrophy of the right submandibular
gland. No neck mass or lymphadenopathy.

Aortic arch: Stable with mild tortuosity. Minimal atherosclerosis
for age. Three vessel arch configuration.

Right carotid system: Stable and negative aside from tortuous distal
cervical right ICA.

Left carotid system: Stable and negative aside from mild tortuosity.

Vertebral arteries:
No proximal right subclavian artery stenosis. Mild tortuosity and
plaque. Normal right vertebral artery origin. Mildly tortuous right
V1 segment. No right vertebral artery stenosis to the skull base.

Normal proximal left subclavian artery and left vertebral artery
origin. Mildly tortuous left V1 segment. Mild bilateral vertebral
artery ectasia, the right is dominant. No left vertebral stenosis to
the skull base.

CTA HEAD

Posterior circulation: Stable distal vertebral arteries without
stenosis. Normal PICA origins. Normal vertebrobasilar junction.
Patent basilar artery without stenosis. Normal SCA and PCA origins.
Posterior communicating arteries are diminutive or absent. Bilateral
PCA branches are stable and within normal limits.

Anterior circulation: Both ICA siphons remain patent with no plaque
or stenosis. Mild siphon tortuosity greater on the right. Normal
ophthalmic artery origins. Patent carotid termini. Normal MCA and
ACA origins. Diminutive or absent anterior communicating artery.
Bilateral ACA branches are stable and within normal limits. Left MCA
M1 segment, trifurcation and left MCA branches are stable and within
normal limits. Right MCA M1 segment, bifurcation, and right MCA
branches are stable and within normal limits.

Venous sinuses: Patent.

Anatomic variants: Mildly dominant right vertebral artery.

Review of the MIP images confirms the above findings
IMPRESSION: 1. No large vessel occlusion. Stable since 2898 and negative for age
CTA Head and Neck.
2. These results were communicated to choose [facility] at [DATE] pmon
03/21/2018by text page via the [employer] messaging system.

## 2018-12-18 NOTE — Progress Notes (Signed)
Remote pacemaker transmission.   

## 2019-01-02 DIAGNOSIS — N3010 Interstitial cystitis (chronic) without hematuria: Secondary | ICD-10-CM | POA: Diagnosis not present

## 2019-01-02 DIAGNOSIS — R350 Frequency of micturition: Secondary | ICD-10-CM | POA: Diagnosis not present

## 2019-01-05 DIAGNOSIS — N3010 Interstitial cystitis (chronic) without hematuria: Secondary | ICD-10-CM | POA: Diagnosis not present

## 2019-01-07 DIAGNOSIS — N3010 Interstitial cystitis (chronic) without hematuria: Secondary | ICD-10-CM | POA: Diagnosis not present

## 2019-01-09 DIAGNOSIS — N3010 Interstitial cystitis (chronic) without hematuria: Secondary | ICD-10-CM | POA: Diagnosis not present

## 2019-01-12 DIAGNOSIS — N3010 Interstitial cystitis (chronic) without hematuria: Secondary | ICD-10-CM | POA: Diagnosis not present

## 2019-01-14 DIAGNOSIS — N3010 Interstitial cystitis (chronic) without hematuria: Secondary | ICD-10-CM | POA: Diagnosis not present

## 2019-01-16 DIAGNOSIS — N3010 Interstitial cystitis (chronic) without hematuria: Secondary | ICD-10-CM | POA: Diagnosis not present

## 2019-01-20 DIAGNOSIS — N3010 Interstitial cystitis (chronic) without hematuria: Secondary | ICD-10-CM | POA: Diagnosis not present

## 2019-01-22 DIAGNOSIS — N3010 Interstitial cystitis (chronic) without hematuria: Secondary | ICD-10-CM | POA: Diagnosis not present

## 2019-01-23 DIAGNOSIS — N3010 Interstitial cystitis (chronic) without hematuria: Secondary | ICD-10-CM | POA: Diagnosis not present

## 2019-01-26 DIAGNOSIS — N3010 Interstitial cystitis (chronic) without hematuria: Secondary | ICD-10-CM | POA: Diagnosis not present

## 2019-01-28 DIAGNOSIS — N3010 Interstitial cystitis (chronic) without hematuria: Secondary | ICD-10-CM | POA: Diagnosis not present

## 2019-01-30 DIAGNOSIS — N3010 Interstitial cystitis (chronic) without hematuria: Secondary | ICD-10-CM | POA: Diagnosis not present

## 2019-02-21 DIAGNOSIS — Z23 Encounter for immunization: Secondary | ICD-10-CM | POA: Diagnosis not present

## 2019-03-05 ENCOUNTER — Ambulatory Visit (INDEPENDENT_AMBULATORY_CARE_PROVIDER_SITE_OTHER): Payer: Medicare Other | Admitting: *Deleted

## 2019-03-05 DIAGNOSIS — I441 Atrioventricular block, second degree: Secondary | ICD-10-CM

## 2019-03-05 LAB — CUP PACEART REMOTE DEVICE CHECK
Battery Remaining Longevity: 44 mo
Battery Voltage: 2.97 V
Brady Statistic AP VP Percent: 15.6 %
Brady Statistic AP VS Percent: 0 %
Brady Statistic AS VP Percent: 84.37 %
Brady Statistic AS VS Percent: 0.03 %
Brady Statistic RA Percent Paced: 15.58 %
Brady Statistic RV Percent Paced: 99.88 %
Date Time Interrogation Session: 20201022121924
Implantable Lead Implant Date: 20140818
Implantable Lead Implant Date: 20140818
Implantable Lead Location: 753859
Implantable Lead Location: 753860
Implantable Lead Model: 5076
Implantable Lead Model: 5076
Implantable Pulse Generator Implant Date: 20140818
Lead Channel Impedance Value: 361 Ohm
Lead Channel Impedance Value: 494 Ohm
Lead Channel Impedance Value: 551 Ohm
Lead Channel Impedance Value: 570 Ohm
Lead Channel Pacing Threshold Amplitude: 0.5 V
Lead Channel Pacing Threshold Amplitude: 0.875 V
Lead Channel Pacing Threshold Pulse Width: 0.4 ms
Lead Channel Pacing Threshold Pulse Width: 0.4 ms
Lead Channel Sensing Intrinsic Amplitude: 1.5 mV
Lead Channel Sensing Intrinsic Amplitude: 1.5 mV
Lead Channel Sensing Intrinsic Amplitude: 19.125 mV
Lead Channel Sensing Intrinsic Amplitude: 19.125 mV
Lead Channel Setting Pacing Amplitude: 2 V
Lead Channel Setting Pacing Amplitude: 2.5 V
Lead Channel Setting Pacing Pulse Width: 0.4 ms
Lead Channel Setting Sensing Sensitivity: 0.9 mV

## 2019-03-11 DIAGNOSIS — Z950 Presence of cardiac pacemaker: Secondary | ICD-10-CM | POA: Diagnosis not present

## 2019-03-11 DIAGNOSIS — I639 Cerebral infarction, unspecified: Secondary | ICD-10-CM | POA: Diagnosis not present

## 2019-03-11 DIAGNOSIS — Z2803 Immunization not carried out because of immune compromised state of patient: Secondary | ICD-10-CM | POA: Diagnosis not present

## 2019-03-11 DIAGNOSIS — I480 Paroxysmal atrial fibrillation: Secondary | ICD-10-CM | POA: Diagnosis not present

## 2019-03-11 DIAGNOSIS — K449 Diaphragmatic hernia without obstruction or gangrene: Secondary | ICD-10-CM | POA: Diagnosis not present

## 2019-03-11 DIAGNOSIS — H8193 Unspecified disorder of vestibular function, bilateral: Secondary | ICD-10-CM | POA: Diagnosis not present

## 2019-03-11 DIAGNOSIS — G909 Disorder of the autonomic nervous system, unspecified: Secondary | ICD-10-CM | POA: Diagnosis not present

## 2019-03-11 DIAGNOSIS — E039 Hypothyroidism, unspecified: Secondary | ICD-10-CM | POA: Diagnosis not present

## 2019-03-11 DIAGNOSIS — K52831 Collagenous colitis: Secondary | ICD-10-CM | POA: Diagnosis not present

## 2019-03-11 DIAGNOSIS — N3010 Interstitial cystitis (chronic) without hematuria: Secondary | ICD-10-CM | POA: Diagnosis not present

## 2019-03-11 DIAGNOSIS — E785 Hyperlipidemia, unspecified: Secondary | ICD-10-CM | POA: Diagnosis not present

## 2019-03-11 DIAGNOSIS — M5412 Radiculopathy, cervical region: Secondary | ICD-10-CM | POA: Diagnosis not present

## 2019-03-17 NOTE — Progress Notes (Signed)
Remote pacemaker transmission.   

## 2019-05-18 DIAGNOSIS — N952 Postmenopausal atrophic vaginitis: Secondary | ICD-10-CM | POA: Diagnosis not present

## 2019-05-18 DIAGNOSIS — N3010 Interstitial cystitis (chronic) without hematuria: Secondary | ICD-10-CM | POA: Diagnosis not present

## 2019-05-18 DIAGNOSIS — Z9189 Other specified personal risk factors, not elsewhere classified: Secondary | ICD-10-CM | POA: Diagnosis not present

## 2019-05-18 DIAGNOSIS — R32 Unspecified urinary incontinence: Secondary | ICD-10-CM | POA: Diagnosis not present

## 2019-05-18 DIAGNOSIS — Z779 Other contact with and (suspected) exposures hazardous to health: Secondary | ICD-10-CM | POA: Diagnosis not present

## 2019-05-19 DIAGNOSIS — Z9189 Other specified personal risk factors, not elsewhere classified: Secondary | ICD-10-CM | POA: Diagnosis not present

## 2019-05-19 DIAGNOSIS — I442 Atrioventricular block, complete: Secondary | ICD-10-CM | POA: Insufficient documentation

## 2019-05-20 ENCOUNTER — Other Ambulatory Visit: Payer: Self-pay

## 2019-05-20 ENCOUNTER — Encounter: Payer: Self-pay | Admitting: Internal Medicine

## 2019-05-20 ENCOUNTER — Ambulatory Visit (INDEPENDENT_AMBULATORY_CARE_PROVIDER_SITE_OTHER): Payer: Medicare Other | Admitting: Internal Medicine

## 2019-05-20 VITALS — BP 126/68 | HR 63 | Ht 66.5 in | Wt 166.8 lb

## 2019-05-20 DIAGNOSIS — R072 Precordial pain: Secondary | ICD-10-CM

## 2019-05-20 DIAGNOSIS — R079 Chest pain, unspecified: Secondary | ICD-10-CM

## 2019-05-20 DIAGNOSIS — I442 Atrioventricular block, complete: Secondary | ICD-10-CM

## 2019-05-20 DIAGNOSIS — Z950 Presence of cardiac pacemaker: Secondary | ICD-10-CM | POA: Diagnosis not present

## 2019-05-20 LAB — CUP PACEART INCLINIC DEVICE CHECK
Battery Remaining Longevity: 39 mo
Battery Voltage: 2.97 V
Brady Statistic AP VP Percent: 19.68 %
Brady Statistic AP VS Percent: 0 %
Brady Statistic AS VP Percent: 80.28 %
Brady Statistic AS VS Percent: 0.04 %
Brady Statistic RA Percent Paced: 19.64 %
Brady Statistic RV Percent Paced: 99.83 %
Date Time Interrogation Session: 20210106154700
Implantable Lead Implant Date: 20140818
Implantable Lead Implant Date: 20140818
Implantable Lead Location: 753859
Implantable Lead Location: 753860
Implantable Lead Model: 5076
Implantable Lead Model: 5076
Implantable Pulse Generator Implant Date: 20140818
Lead Channel Impedance Value: 361 Ohm
Lead Channel Impedance Value: 494 Ohm
Lead Channel Impedance Value: 589 Ohm
Lead Channel Impedance Value: 627 Ohm
Lead Channel Pacing Threshold Amplitude: 0.5 V
Lead Channel Pacing Threshold Amplitude: 1 V
Lead Channel Pacing Threshold Pulse Width: 0.4 ms
Lead Channel Pacing Threshold Pulse Width: 0.4 ms
Lead Channel Sensing Intrinsic Amplitude: 1.9 mV
Lead Channel Sensing Intrinsic Amplitude: 17.4 mV
Lead Channel Setting Pacing Amplitude: 2.25 V
Lead Channel Setting Pacing Amplitude: 2.5 V
Lead Channel Setting Pacing Pulse Width: 0.4 ms
Lead Channel Setting Sensing Sensitivity: 0.9 mV

## 2019-05-20 NOTE — Patient Instructions (Addendum)
Medication Instructions:  Your physician recommends that you continue on your current medications as directed. Please refer to the Current Medication list given to you today.  Labwork: BMET ordered  Testing/Procedures: Your cardiac CT will be scheduled at one of the below locations:   [facility] [address] [place], [place] [address] (336) [place] [address] [place], [place] [address] [phone]  If scheduled at [facility], please arrive at the [facility] main entrance of [facility] 30-45 minutes prior to test start time. Proceed to the [facility] Radiology Department (first floor) to check-in and test prep.  If scheduled at [facility], please arrive 15 mins early for check-in and test prep.  Please follow these instructions carefully (unless otherwise directed):    On the Night Before the Test: . Be sure to Drink plenty of water. . Do not consume any caffeinated/decaffeinated beverages or chocolate 12 hours prior to your test. . Do not take any antihistamines 12 hours prior to your test.  On the Day of the Test: . Drink plenty of water. Do not drink any water within one hour of the test. . Do not eat any food 4 hours prior to the test. . You may take your regular medications prior to the test.  . FEMALES- please wear underwire-free bra if available        -Drink plenty of water        -Take metoprolol (Lopressor) 2 hours prior to test (if applicable).                  -If HR is less than 55 BPM- No Beta Blocker                -IF HR is greater than 55 BPM and patient is less than or equal to 60 yrs old Lopressor 100mg .                    After the Test: . Drink plenty of water. . After receiving IV contrast, you may experience a mild flushed feeling. This is normal. . On occasion, you may experience a mild rash up to 24 hours after  the test. This is not dangerous. If this occurs, you can take Benadryl 25 mg and increase your fluid intake. . If you experience trouble breathing, this can be serious. If it is severe call 911 IMMEDIATELY. If it is mild, please call our office.    Once we have confirmed authorization from your insurance company, we will call you to set up a date and time for your test.   For non-scheduling related questions, please contact the cardiac imaging nurse navigator should you have any questions/concerns: Marchia Bond, RN Navigator Cardiac Imaging [facility] [phone] Office     Follow-Up: Your physician wants you to follow-up in: one year with Dr Caryl Comes  Cardiac CT Angiogram A cardiac CT angiogram is a procedure to look at the heart and the area around the heart. It may be done to help find the cause of chest pains or other symptoms of heart disease. During this procedure, a substance called contrast dye is injected into the blood vessels in the area to be checked. A large X-ray machine, called a CT scanner, then takes detailed pictures of the heart and the surrounding area. The procedure is also sometimes called a coronary CT angiogram, coronary artery  scanning, or CTA. A cardiac CT angiogram allows the health care provider to see how well blood is flowing to and from the heart. The health care provider will be able to see if there are any problems, such as:  Blockage or narrowing of the coronary arteries in the heart.  Fluid around the heart.  Signs of weakness or disease in the muscles, valves, and tissues of the heart. Tell a health care provider about:  Any allergies you have. This is especially important if you have had a previous allergic reaction to contrast dye.  All medicines you are taking, including vitamins, herbs, eye drops, creams, and over-the-counter medicines.  Any blood disorders you have.  Any surgeries you have had.  Any medical  conditions you have.  Whether you are pregnant or may be pregnant.  Any anxiety disorders, chronic pain, or other conditions you have that may increase your stress or prevent you from lying still. What are the risks? Generally, this is a safe procedure. However, problems may occur, including:  Bleeding.  Infection.  Allergic reactions to medicines or dyes.  Damage to other structures or organs.  Kidney damage from the contrast dye that is used.  Increased risk of cancer from radiation exposure. This risk is low. Talk with your health care provider about: ? The risks and benefits of testing. ? How you can receive the lowest dose of radiation. What happens before the procedure?  Wear comfortable clothing and remove any jewelry, glasses, dentures, and hearing aids.  Follow instructions from your health care provider about eating and drinking. This may include: ? For 12 hours before the procedure -- avoid caffeine. This includes tea, coffee, soda, energy drinks, and diet pills. Drink plenty of water or other fluids that do not have caffeine in them. Being well hydrated can prevent complications. ? For 4-6 hours before the procedure -- stop eating and drinking. The contrast dye can cause nausea, but this is less likely if your stomach is empty.  Ask your health care provider about changing or stopping your regular medicines. This is especially important if you are taking diabetes medicines, blood thinners, or medicines to treat problems with erections (erectile dysfunction). What happens during the procedure?   Hair on your chest may need to be removed so that small sticky patches called electrodes can be placed on your chest. These will transmit information that helps to monitor your heart during the procedure.  An IV will be inserted into one of your veins.  You might be given a medicine to control your heart rate during the procedure. This will help to ensure that good images are  obtained.  You will be asked to lie on an exam table. This table will slide in and out of the CT machine during the procedure.  Contrast dye will be injected into the IV. You might feel warm, or you may get a metallic taste in your mouth.  You will be given a medicine called nitroglycerin. This will relax or dilate the arteries in your heart.  The table that you are lying on will move into the CT machine tunnel for the scan.  The person running the machine will give you instructions while the scans are being done. You may be asked to: ? Keep your arms above your head. ? Hold your breath. ? Stay very still, even if the table is moving.  When the scanning is complete, you will be moved out of the machine.  The IV will  be removed. The procedure may vary among health care providers and hospitals. What can I expect after the procedure? After your procedure, it is common to have:  A metallic taste in your mouth from the contrast dye.  A feeling of warmth.  A headache from the nitroglycerin. Follow these instructions at home:  Take over-the-counter and prescription medicines only as told by your health care provider.  If you are told, drink enough fluid to keep your urine pale yellow. This will help to flush the contrast dye out of your body.  Most people can return to their normal activities right after the procedure. Ask your health care provider what activities are safe for you.  It is up to you to get the results of your procedure. Ask your health care provider, or the department that is doing the procedure, when your results will be ready.  Keep all follow-up visits as told by your health care provider. This is important. Contact a health care provider if:  You have any symptoms of allergy to the contrast dye. These include: ? Shortness of breath. ? Rash or hives. ? A racing heartbeat. Summary  A cardiac CT angiogram is a procedure to look at the heart and the area around  the heart. It may be done to help find the cause of chest pains or other symptoms of heart disease.  During this procedure, a large X-ray machine, called a CT scanner, takes detailed pictures of the heart and the surrounding area after a contrast dye has been injected into blood vessels in the area.  Ask your health care provider about changing or stopping your regular medicines before the procedure. This is especially important if you are taking diabetes medicines, blood thinners, or medicines to treat erectile dysfunction.  If you are told, drink enough fluid to keep your urine pale yellow. This will help to flush the contrast dye out of your body. This information is not intended to replace advice given to you by your health care provider. Make sure you discuss any questions you have with your health care provider. Document Revised: 12/24/2018 Document Reviewed: 12/24/2018 Elsevier Patient Education  2020 [place] will receive a reminder letter in the mail two months in advance. If you don't receive a letter, please call our office to schedule the follow-up appointment.  Remote monitoring is used to monitor your Pacemaker of ICD from home. This monitoring reduces the number of office visits required to check your device to one time per year. It allows [place] to keep an eye on the functioning of your device to ensure it is working properly.   Any Other Special Instructions Will Be Listed Below (If Applicable).  If you need a refill on your cardiac medications before your next appointment, please call your pharmacy.

## 2019-05-20 NOTE — Progress Notes (Signed)
A and a and     Patient Care Team: Burnard Bunting, MD as PCP - General (Internal Medicine) Chucky May, MD as Consulting Physician (Psychiatry)   HPI  [name] is a 74 y.o. female Seen in followup for high grade heart block for which she underwent pacing (2014 Medtronic )compllicated by microperforation modest effusion and then adderall withdrawal; now with complete heart block  She is far  better following reprogramming of the device after we noted upper rate behavior limiting heart rate excursion she still has complaints sometimes and exercise intolerance on stairs  She reminded that she has a diagnosis of central polyneuropathy;    She has been followed at [facility] with Marilynne Drivers. She was last seen 6/19 Salt and proamatine were again recommended with up titration of her dose to 4 times a day.    DATE TEST EF   9/14 Echo   55-60 %   12/19 Echo   55-60 %         Date Cr K Hgb  6/20 0.8 4.0 13.8           She has exertional left chest discomfort with radiation into her arm.  Predictably precipitated with increasingly less exertion.  Duration is typically 5-10 minutes resolving with rest.  Accompanied by shortness of breath  Also spells of nausea accompanying dizziness.  This is nonpositional.  It is associated with her being flushed.         Past Medical History:  Diagnosis Date  . ADHD (attention deficit hyperactivity disorder)   . Allergy    trees/pollen, mold, fungus, dust mites. Takes allergy shots  . Arthritis    PAIN AND OA LEFT HIP  . Asthma    allergist Dr Olena Heckle- monthly allergy injections  . Autonomic dysfunction    CENTRAL NERVOUS SYSTEM NEUROPATHY - DX BY DR. Erling Cruz MORE THAN 10 YRS AGO - AND IT IS FELT TO CONTRIBUTE TO THE AUTOMIC  DYSFUNCTION-- PT HAS NUMBNESS LEGS AND FEET AND SOMETIIMES TIPS OF FINGER, SEVERE CONSTIPATION( NO SENSATION TO HAVE BM ), DOUBLE VISION, ORTHOSTATIC HYPOTENSION  . Blood transfusion   . Breast cancer ([place])     right side  . Bruises easily   . Cataract   . Chest pain    a. 12/2012 Cath: nl cors, EF 55-65%.  . Complication of anesthesia    reaction to some anesthetics/ 7/12 anesth record on chart- states prefers epidural  . CVA (cerebrovascular accident) ([place]) 03/21/2018  . Depression   . Diverticulosis   . Dysrhythmia    HX OF HIGH GRADE HEART BLOCK - REQUIRED PACEMAKER INSERTION  . Esophageal stricture   . Gastritis   . GERD (gastroesophageal reflux disease)   . H/O hiatal hernia   . Hemorrhoids   . Hernia of abdominal wall    spigelian hernia RLQ - SURGERY TO REPAIR  . Hypothyroidism   . Interstitial cystitis   . Latex allergy, contact dermatitis   . Mallory - Weiss tear    HEALED   . Neuromuscular disorder (HCC)    central nervous system neuropathy- seen per Dr Erling Cruz  . Pacemaker   . Pericardial effusion    a. 12/2012 following ppm placement;  b. 01/01/2013 Echo: EF 55-60%, small pericardial effusion w/o RV collapse-->No need for tap/window.  Marland Kitchen PONV (postoperative nausea and vomiting)    pt needs scop patch  . Recurrent upper respiratory infection (URI) 1/13- to present   bronchitis following surgery- states improved but still with  cough. OV with [facility] Dr [name] 09/06/11 on chart  . Shortness of breath    AT TIMES - BUT MUCH IMPROVED AFTER PACEMAKER WAS REPROGRAMED.  Marland Kitchen Symptomatic bradycardia    a. 12/2012 s/p MDT dual chamber PPM, ser # [id number]; b. 12/2012 post-op course complicated by pericardial effusinon req lead revision.  . Thyroid disease     Past Surgical History:  Procedure Laterality Date  . ABDOMINAL HYSTERECTOMY  1974  . BACK SURGERY     cervical fusion 4-5 with plate  . BLADDER SUSPENSION    . BREAST BIOPSY  2002   NO BLOOD PRESSURES ON RIGHT SIDE/   s/p  axillary node dissection  . CYSTO WITH HYDRODISTENSION  09/07/2011   Procedure: CYSTOSCOPY/HYDRODISTENSION;  Surgeon: Ailene Rud, MD;  Location: [facility];  Service: Urology;  Laterality: N/A;   INSTILLATION OF MARCAINE/PYRIDIUM INSTILLATION OF MARCAINE/KENALOG  . CYSTOSCOPY  1975, 2007  . EYE SURGERY     LASIK EYE SURGERY BILATERAL  . LAPAROSCOPY  1973  . LEAD REVISION N/A 12/30/2012   Procedure: LEAD REVISION;  Surgeon: Evans Lance, MD;  Location: [facility] CATH LAB;  Service: Cardiovascular;  Laterality: N/A;  . LEFT HEART CATHETERIZATION WITH CORONARY ANGIOGRAM N/A 12/29/2012   Procedure: LEFT HEART CATHETERIZATION WITH CORONARY ANGIOGRAM;  Surgeon: Peter M Martinique, MD;  Location: [facility] CATH LAB;  Service: Cardiovascular;  Laterality: N/A;  . MASTECTOMY MODIFIED RADICAL     right; with immediate reconstruction  . MYRINGOPLASTY  1962  . NECK SURGERY     c4-5 ruptured disc  . OOPHORECTOMY  1982  . PACEMAKER INSERTION    . PERMANENT PACEMAKER INSERTION N/A 12/29/2012   Procedure: PERMANENT PACEMAKER INSERTION;  Surgeon: Deboraha Sprang, MD;  Location: [facility] CATH LAB;  Service: Cardiovascular;  Laterality: N/A;  . RECTOCELE REPAIR     with cystocele repair  . TONSILLECTOMY    . TOTAL HIP ARTHROPLASTY Right   . TOTAL HIP ARTHROPLASTY Left 06/12/2013   Procedure: LEFT TOTAL HIP ARTHROPLASTY ANTERIOR APPROACH;  Surgeon: Mcarthur Rossetti, MD;  Location: [facility];  Service: Orthopedics;  Laterality: Left;  . TYMPANOPLASTY  1973   right  . ULNAR NERVE TRANSPOSITION Right   . VENTRAL HERNIA REPAIR  05/30/2011   Procedure: HERNIA REPAIR VENTRAL ADULT;  Surgeon: Haywood Lasso, MD;  Location: [place];  Service: General;  Laterality: Right;  repair right spigelian hernia    Current Outpatient Medications  Medication Sig Dispense Refill  . albuterol (PROVENTIL HFA;VENTOLIN HFA) 108 (90 Base) MCG/ACT inhaler Inhale 2 puffs into the lungs every 6 (six) hours as needed. For shortness of breath 1 Inhaler 1  . aspirin EC 81 MG EC tablet Take 1 tablet (81 mg total) by mouth daily.    Marland Kitchen b complex vitamins capsule Take 1 capsule by mouth daily.     . budesonide (ENTOCORT EC) 3 MG  24 hr capsule Take 3 mg by mouth as directed.    . cetirizine (ZYRTEC) 10 MG tablet Take 10 mg by mouth 2 (two) times daily.     . cholecalciferol (VITAMIN D) 1000 UNITS tablet Take 1,000 Units by mouth daily.    . cycloSPORINE (RESTASIS) 0.05 % ophthalmic emulsion Place 1 drop into both eyes 2 (two) times daily as needed (unk).     Marland Kitchen escitalopram (LEXAPRO) 10 MG tablet Take 10 mg by mouth daily.    Marland Kitchen estradiol (ESTRING) 2 MG vaginal ring Place 2 mg vaginally every 3 (three) months.  follow package directions    . fluocinonide (LIDEX) 0.05 % external solution 1 APPLICATION ON THE SKIN AS DIRECTED. APPLY TO SCALP BID FOR 1-2 WEEKS PRN FLARE AS DIRECTED  1  . fluticasone (FLONASE) 50 MCG/ACT nasal spray Place 1 spray into both nostrils daily. 48 g 1  . levothyroxine (SYNTHROID, LEVOTHROID) 100 MCG tablet Take 1 tablet by mouth daily.    . Magnesium 500 MG CAPS Take 1 capsule by mouth daily.    . meclizine (ANTIVERT) 25 MG tablet Take 12.5 mg by mouth 3 (three) times daily as needed for dizziness.    . Melatonin 1 MG TABS Take 1.5 tablets by mouth at bedtime as needed.     . midodrine (PROAMATINE) 5 MG tablet Take 1 tablet by mouth every 3 (three) hours. Takes 4-5 times daily from waking until no later than 7 pm.    . Pentosan Polysulfate Sodium (ELMIRON PO) Take 1 capsule by mouth 2 (two) times daily.    . potassium chloride (KLOR-CON M10) 10 MEQ tablet Take 1 tablet (10 mEq total) by mouth daily. 90 tablet 3  . Probiotic Product (ALIGN) 4 MG CAPS Take 4 mg by mouth at bedtime.     . rosuvastatin (CRESTOR) 10 MG tablet Take 1 tablet (10 mg total) by mouth daily. 90 tablet 0  . vitamin C (ASCORBIC ACID) 500 MG tablet Take 500 mg by mouth daily.     . RABEprazole (ACIPHEX) 20 MG tablet Take 1 tablet (20 mg total) by mouth daily. 90 tablet 2   No current facility-administered medications for this visit.   Facility-Administered Medications Ordered in Other Visits  Medication Dose Route Frequency  Provider Last Rate Last Admin  . bupivacaine (MARCAINE) 0.5 % 10 mL, triamcinolone acetonide (KENALOG-40) 40 mg injection   Subcutaneous Once Carolan Clines, MD      . bupivacaine (MARCAINE) 0.5 % 15 mL, phenazopyridine (PYRIDIUM) 400 mg bladder mixture   Bladder Instillation Once Carolan Clines, MD        Allergies  Allergen Reactions  . Anesthetics, [place] Other (See Comments)    Patient unsure of names, however multiples cause swelling of airway & nausea  . Clindamycin Swelling  . Neomycin Swelling  . Shellfish Allergy Other (See Comments)    Neurotoxic reaction  . Sulfonamide Derivatives Anaphylaxis and Swelling  . Zolpidem Tartrate Other (See Comments)    Jerking motions   . Azithromycin Other (See Comments)    Severe gastritis   . Bactroban Other (See Comments)    Causes sores in nose  . Ciprofloxacin     REACTION: joint swelling  . Codeine Swelling  . Meperidine Hcl Nausea Only    Hallucinations   . Morphine Nausea Only    Hallucinations   . Neosporin [Neomycin-Polymyxin-Gramicidin] Hives and Dermatitis    All topical "orin's ointment"  . Nitrofurantoin     REACTION: neuropathy in legs  . Other     ALLERGIC TO WARM WATER SHELLFISH  PT STATES PLEASE NOTE SHE CAN TAKE OXYCODONE AND TYLENOL FOR PAIN -NOT ALLERGIC  . Penicillins Other (See Comments)    Swelling in joints  . Tramadol     Makes jerk  . Latex Rash    Review of Systems negative except from HPI and PMH  Physical Exam BP 126/68   Pulse 63   Ht 5' 6.5" (1.689 m)   Wt 166 lb 12.8 oz (75.7 kg)   SpO2 96%   BMI 26.52 kg/m  Well developed and well  nourished in no acute distress HENT normal Neck supple with JVP-flat Clear Device pocket well healed; without hematoma or erythema.  There is no tethering  Regular rate and rhythm, no  murmur Abd-soft with active BS No Clubbing cyanosis   edema Skin-warm and dry A & Oriented  Grossly normal sensory and motor function  ECG sinus at 60 with P  synchronous pacing   Assessment and  Plan  Complete heart block  Pacemaker-Medtronic The patient's device was interrogated.  The information was reviewed. No changes were made in the programming.     Exercise associated hypotension  ? Dysautonomia  Central polyneuropathy    Exertional associated chest discomfort concerning for angina.  Stress echo was -2014.  Have discussed with Dr. London Sheer.  Will undertake CTA.  Pacemaker should not be an issue.  With the progression of her heart block to permanent and complete, will have to be cognizant of pacemaker cardiomyopathy; no evidence of volume overload at this time or symptoms to suggest heart failure.  Her dizzy spells do not sound as if they are cardiovascular being nonpositional and been associated with flushing.  She will follow up with Dr. Jannifer Franklin.

## 2019-05-21 LAB — BASIC METABOLIC PANEL
BUN/Creatinine Ratio: 19 (ref 12–28)
BUN: 13 mg/dL (ref 8–27)
CO2: 24 mmol/L (ref 20–29)
Calcium: 9.3 mg/dL (ref 8.7–10.3)
Chloride: 102 mmol/L (ref 96–106)
Creatinine, Ser: 0.67 mg/dL (ref 0.57–1.00)
GFR calc Af Amer: 100 mL/min/{1.73_m2} (ref >59)
GFR calc non Af Amer: 87 mL/min/{1.73_m2} (ref >59)
Glucose: 80 mg/dL (ref 65–99)
Potassium: 4.3 mmol/L (ref 3.5–5.2)
Sodium: 139 mmol/L (ref 134–144)

## 2019-05-28 DIAGNOSIS — R82998 Other abnormal findings in urine: Secondary | ICD-10-CM | POA: Diagnosis not present

## 2019-06-03 ENCOUNTER — Other Ambulatory Visit: Payer: Self-pay | Admitting: Internal Medicine

## 2019-06-04 ENCOUNTER — Ambulatory Visit (INDEPENDENT_AMBULATORY_CARE_PROVIDER_SITE_OTHER): Payer: Medicare Other | Admitting: *Deleted

## 2019-06-04 DIAGNOSIS — I441 Atrioventricular block, second degree: Secondary | ICD-10-CM | POA: Diagnosis not present

## 2019-06-05 ENCOUNTER — Telehealth: Payer: Self-pay

## 2019-06-05 LAB — CUP PACEART REMOTE DEVICE CHECK
Battery Remaining Longevity: 38 mo
Battery Voltage: 2.97 V
Brady Statistic AP VP Percent: 19.94 %
Brady Statistic AP VS Percent: 0 %
Brady Statistic AS VP Percent: 80.04 %
Brady Statistic AS VS Percent: 0.02 %
Brady Statistic RA Percent Paced: 19.94 %
Brady Statistic RV Percent Paced: 99.98 %
Date Time Interrogation Session: 20210122110055
Implantable Lead Implant Date: 20140818
Implantable Lead Implant Date: 20140818
Implantable Lead Location: 753859
Implantable Lead Location: 753860
Implantable Lead Model: 5076
Implantable Lead Model: 5076
Implantable Pulse Generator Implant Date: 20140818
Lead Channel Impedance Value: 342 Ohm
Lead Channel Impedance Value: 475 Ohm
Lead Channel Impedance Value: 589 Ohm
Lead Channel Impedance Value: 627 Ohm
Lead Channel Pacing Threshold Amplitude: 0.5 V
Lead Channel Pacing Threshold Amplitude: 1.125 V
Lead Channel Pacing Threshold Pulse Width: 0.4 ms
Lead Channel Pacing Threshold Pulse Width: 0.4 ms
Lead Channel Sensing Intrinsic Amplitude: 1.375 mV
Lead Channel Sensing Intrinsic Amplitude: 1.375 mV
Lead Channel Sensing Intrinsic Amplitude: 17.375 mV
Lead Channel Sensing Intrinsic Amplitude: 17.375 mV
Lead Channel Setting Pacing Amplitude: 2.25 V
Lead Channel Setting Pacing Amplitude: 2.5 V
Lead Channel Setting Pacing Pulse Width: 0.4 ms
Lead Channel Setting Sensing Sensitivity: 0.9 mV

## 2019-06-05 NOTE — Telephone Encounter (Signed)
Spoke with patient to remind of missed remote transmission 

## 2019-06-12 ENCOUNTER — Other Ambulatory Visit: Payer: Self-pay | Admitting: Allergy and Immunology

## 2019-06-16 DIAGNOSIS — Z1231 Encounter for screening mammogram for malignant neoplasm of breast: Secondary | ICD-10-CM | POA: Diagnosis not present

## 2019-06-17 ENCOUNTER — Other Ambulatory Visit: Payer: Self-pay | Admitting: Radiology

## 2019-06-17 DIAGNOSIS — R9230 Dense breasts, unspecified: Secondary | ICD-10-CM

## 2019-06-17 DIAGNOSIS — R922 Inconclusive mammogram: Secondary | ICD-10-CM

## 2019-06-17 DIAGNOSIS — Z853 Personal history of malignant neoplasm of breast: Secondary | ICD-10-CM

## 2019-06-21 ENCOUNTER — Ambulatory Visit: Payer: Medicare Other | Attending: Internal Medicine

## 2019-06-21 DIAGNOSIS — Z23 Encounter for immunization: Secondary | ICD-10-CM

## 2019-06-21 NOTE — Progress Notes (Signed)
   Covid-19 Vaccination Clinic  Name:  EVIANA GUNNELL    MRN: [id number] DOB: November 28, 1945  06/21/2019  Ms. Konop was observed post Covid-19 immunization for 15 minutes without incidence. She was provided with Vaccine Information Sheet and instruction to access the V-Safe system.   Ms. Stitely was instructed to call 911 with any severe reactions post vaccine: Marland Kitchen Difficulty breathing  . Swelling of your face and throat  . A fast heartbeat  . A bad rash all over your body  . Dizziness and weakness    Immunizations Administered    Name Date Dose VIS Date Route   Pfizer COVID-19 Vaccine 06/21/2019 11:29 AM 0.3 mL 04/24/2019 Intramuscular   Manufacturer: [place]   Lot: [id number]   [place]: [id number]

## 2019-06-24 DIAGNOSIS — Z1501 Genetic susceptibility to malignant neoplasm of breast: Secondary | ICD-10-CM | POA: Diagnosis not present

## 2019-06-25 ENCOUNTER — Encounter (HOSPITAL_COMMUNITY): Payer: Self-pay

## 2019-06-25 ENCOUNTER — Telehealth (HOSPITAL_COMMUNITY): Payer: Self-pay | Admitting: Emergency Medicine

## 2019-06-25 NOTE — Telephone Encounter (Signed)
Reaching out to patient to offer assistance regarding upcoming cardiac imaging study; pt verbalizes understanding of appt date/time, parking situation and where to check in, pre-test NPO status and medications ordered, and verified current allergies; name and call back number provided for further questions should they arise Biviana Saddler RN Navigator Cardiac Imaging [facility] [phone] office [phone] cell 

## 2019-06-26 ENCOUNTER — Other Ambulatory Visit: Payer: Self-pay

## 2019-06-26 ENCOUNTER — Ambulatory Visit (HOSPITAL_COMMUNITY)
Admission: RE | Admit: 2019-06-26 | Discharge: 2019-06-26 | Disposition: A | Discharge status: Home / Self Care | Payer: Medicare Other | Source: Ambulatory Visit | Attending: Internal Medicine | Admitting: Internal Medicine

## 2019-06-26 DIAGNOSIS — R072 Precordial pain: Secondary | ICD-10-CM

## 2019-06-26 MED ORDER — NITROGLYCERIN 0.4 MG SL SUBL: 0.8000 mg | SUBLINGUAL_TABLET | Freq: Once | SUBLINGUAL | Status: DC

## 2019-06-26 MED ORDER — NITROGLYCERIN 0.4 MG SL SUBL
SUBLINGUAL_TABLET | SUBLINGUAL | Status: AC
  Filled 2019-06-26: qty 2

## 2019-06-26 MED ORDER — IOHEXOL 350 MG/ML SOLN
100.0000 mL | Freq: Once | INTRAVENOUS | Status: AC | PRN
  Administered 2019-06-26: 15:00:00 100 mL via INTRAVENOUS

## 2019-07-09 ENCOUNTER — Ambulatory Visit: Payer: Medicare Other

## 2019-07-15 ENCOUNTER — Ambulatory Visit: Payer: Medicare Other

## 2019-07-15 ENCOUNTER — Ambulatory Visit: Payer: Medicare Other | Attending: Internal Medicine

## 2019-07-15 DIAGNOSIS — Z23 Encounter for immunization: Secondary | ICD-10-CM | POA: Insufficient documentation

## 2019-07-15 NOTE — Progress Notes (Signed)
   Covid-19 Vaccination Clinic  Name:  MAALIYAH ALLOY    MRN: [id number] DOB: 1946-02-05  07/15/2019  Ms. Crout was observed post Covid-19 immunization for 30 minutes based on pre-vaccination screening without incident. She was provided with Vaccine Information Sheet and instruction to access the V-Safe system.   Ms. Brutus was instructed to call 911 with any severe reactions post vaccine: Marland Kitchen Difficulty breathing  . Swelling of face and throat  . A fast heartbeat  . A bad rash all over body  . Dizziness and weakness   Immunizations Administered    Name Date Dose VIS Date Route   Pfizer COVID-19 Vaccine 07/15/2019 10:24 AM 0.3 mL 04/24/2019 Intramuscular   Manufacturer: [place]   Lot: [id number]   [place]: [id number]

## 2019-07-29 DIAGNOSIS — G903 Multi-system degeneration of the autonomic nervous system: Secondary | ICD-10-CM | POA: Diagnosis not present

## 2019-07-29 DIAGNOSIS — I498 Other specified cardiac arrhythmias: Secondary | ICD-10-CM | POA: Diagnosis not present

## 2019-07-29 DIAGNOSIS — Z87898 Personal history of other specified conditions: Secondary | ICD-10-CM | POA: Diagnosis not present

## 2019-07-29 DIAGNOSIS — Z950 Presence of cardiac pacemaker: Secondary | ICD-10-CM | POA: Diagnosis not present

## 2019-07-29 DIAGNOSIS — Z01818 Encounter for other preprocedural examination: Secondary | ICD-10-CM | POA: Diagnosis not present

## 2019-08-10 ENCOUNTER — Other Ambulatory Visit: Payer: Medicare Other

## 2019-08-10 DIAGNOSIS — Z20822 Contact with and (suspected) exposure to covid-19: Secondary | ICD-10-CM | POA: Diagnosis not present

## 2019-08-10 DIAGNOSIS — Z01812 Encounter for preprocedural laboratory examination: Secondary | ICD-10-CM | POA: Diagnosis not present

## 2019-08-12 DIAGNOSIS — Z885 Allergy status to narcotic agent status: Secondary | ICD-10-CM | POA: Diagnosis not present

## 2019-08-12 DIAGNOSIS — I4891 Unspecified atrial fibrillation: Secondary | ICD-10-CM | POA: Diagnosis not present

## 2019-08-12 DIAGNOSIS — K449 Diaphragmatic hernia without obstruction or gangrene: Secondary | ICD-10-CM | POA: Diagnosis not present

## 2019-08-12 DIAGNOSIS — Z853 Personal history of malignant neoplasm of breast: Secondary | ICD-10-CM | POA: Diagnosis not present

## 2019-08-12 DIAGNOSIS — K523 Indeterminate colitis: Secondary | ICD-10-CM | POA: Diagnosis not present

## 2019-08-12 DIAGNOSIS — Z950 Presence of cardiac pacemaker: Secondary | ICD-10-CM | POA: Diagnosis not present

## 2019-08-12 DIAGNOSIS — Z8673 Personal history of transient ischemic attack (TIA), and cerebral infarction without residual deficits: Secondary | ICD-10-CM | POA: Diagnosis not present

## 2019-08-12 DIAGNOSIS — G903 Multi-system degeneration of the autonomic nervous system: Secondary | ICD-10-CM | POA: Diagnosis not present

## 2019-08-12 DIAGNOSIS — Z79899 Other long term (current) drug therapy: Secondary | ICD-10-CM | POA: Diagnosis not present

## 2019-08-12 DIAGNOSIS — Z882 Allergy status to sulfonamides status: Secondary | ICD-10-CM | POA: Diagnosis not present

## 2019-08-12 DIAGNOSIS — K5730 Diverticulosis of large intestine without perforation or abscess without bleeding: Secondary | ICD-10-CM | POA: Diagnosis not present

## 2019-08-12 DIAGNOSIS — J4520 Mild intermittent asthma, uncomplicated: Secondary | ICD-10-CM | POA: Diagnosis not present

## 2019-08-12 DIAGNOSIS — Z1211 Encounter for screening for malignant neoplasm of colon: Secondary | ICD-10-CM | POA: Diagnosis not present

## 2019-08-12 DIAGNOSIS — R1013 Epigastric pain: Secondary | ICD-10-CM | POA: Diagnosis not present

## 2019-08-12 DIAGNOSIS — K222 Esophageal obstruction: Secondary | ICD-10-CM | POA: Diagnosis not present

## 2019-08-12 DIAGNOSIS — K5900 Constipation, unspecified: Secondary | ICD-10-CM | POA: Diagnosis not present

## 2019-08-12 DIAGNOSIS — Z800 Family history of malignant neoplasm of digestive organs: Secondary | ICD-10-CM | POA: Diagnosis not present

## 2019-08-12 DIAGNOSIS — E039 Hypothyroidism, unspecified: Secondary | ICD-10-CM | POA: Diagnosis not present

## 2019-08-12 DIAGNOSIS — Z7982 Long term (current) use of aspirin: Secondary | ICD-10-CM | POA: Diagnosis not present

## 2019-08-12 DIAGNOSIS — K219 Gastro-esophageal reflux disease without esophagitis: Secondary | ICD-10-CM | POA: Diagnosis not present

## 2019-08-12 DIAGNOSIS — K3189 Other diseases of stomach and duodenum: Secondary | ICD-10-CM | POA: Diagnosis not present

## 2019-08-12 DIAGNOSIS — Z880 Allergy status to penicillin: Secondary | ICD-10-CM | POA: Diagnosis not present

## 2019-08-12 DIAGNOSIS — K317 Polyp of stomach and duodenum: Secondary | ICD-10-CM | POA: Diagnosis not present

## 2019-08-12 DIAGNOSIS — Z881 Allergy status to other antibiotic agents status: Secondary | ICD-10-CM | POA: Diagnosis not present

## 2019-08-12 DIAGNOSIS — I459 Conduction disorder, unspecified: Secondary | ICD-10-CM | POA: Diagnosis not present

## 2019-08-24 DIAGNOSIS — K219 Gastro-esophageal reflux disease without esophagitis: Secondary | ICD-10-CM | POA: Diagnosis not present

## 2019-08-24 DIAGNOSIS — K52831 Collagenous colitis: Secondary | ICD-10-CM | POA: Diagnosis not present

## 2019-08-24 DIAGNOSIS — R110 Nausea: Secondary | ICD-10-CM | POA: Diagnosis not present

## 2019-08-24 DIAGNOSIS — R1013 Epigastric pain: Secondary | ICD-10-CM | POA: Diagnosis not present

## 2019-08-24 DIAGNOSIS — K449 Diaphragmatic hernia without obstruction or gangrene: Secondary | ICD-10-CM | POA: Diagnosis not present

## 2019-08-24 DIAGNOSIS — R1031 Right lower quadrant pain: Secondary | ICD-10-CM | POA: Diagnosis not present

## 2019-08-26 DIAGNOSIS — Z9049 Acquired absence of other specified parts of digestive tract: Secondary | ICD-10-CM | POA: Diagnosis not present

## 2019-08-26 DIAGNOSIS — N200 Calculus of kidney: Secondary | ICD-10-CM | POA: Diagnosis not present

## 2019-08-26 DIAGNOSIS — K5730 Diverticulosis of large intestine without perforation or abscess without bleeding: Secondary | ICD-10-CM | POA: Diagnosis not present

## 2019-08-27 DIAGNOSIS — R110 Nausea: Secondary | ICD-10-CM | POA: Diagnosis not present

## 2019-08-27 DIAGNOSIS — N200 Calculus of kidney: Secondary | ICD-10-CM | POA: Diagnosis not present

## 2019-09-03 ENCOUNTER — Ambulatory Visit (INDEPENDENT_AMBULATORY_CARE_PROVIDER_SITE_OTHER): Payer: Medicare Other | Admitting: *Deleted

## 2019-09-03 DIAGNOSIS — I441 Atrioventricular block, second degree: Secondary | ICD-10-CM | POA: Diagnosis not present

## 2019-09-03 LAB — CUP PACEART REMOTE DEVICE CHECK
Date Time Interrogation Session: 20210422164803
Implantable Lead Implant Date: 20140818
Implantable Lead Implant Date: 20140818
Implantable Lead Location: 753859
Implantable Lead Location: 753860
Implantable Lead Model: 5076
Implantable Lead Model: 5076
Implantable Pulse Generator Implant Date: 20140818
Lead Channel Setting Pacing Amplitude: 2.25 V
Lead Channel Setting Pacing Amplitude: 2.5 V
Lead Channel Setting Pacing Pulse Width: 0.4 ms
Lead Channel Setting Sensing Sensitivity: 0.9 mV

## 2019-09-04 NOTE — Progress Notes (Signed)
PPM Remote  

## 2019-12-01 DIAGNOSIS — E7849 Other hyperlipidemia: Secondary | ICD-10-CM | POA: Diagnosis not present

## 2019-12-01 DIAGNOSIS — E038 Other specified hypothyroidism: Secondary | ICD-10-CM | POA: Diagnosis not present

## 2019-12-03 ENCOUNTER — Ambulatory Visit (INDEPENDENT_AMBULATORY_CARE_PROVIDER_SITE_OTHER): Payer: Medicare Other | Admitting: *Deleted

## 2019-12-03 DIAGNOSIS — I442 Atrioventricular block, complete: Secondary | ICD-10-CM

## 2019-12-04 LAB — CUP PACEART REMOTE DEVICE CHECK
Battery Remaining Longevity: 37 mo
Battery Voltage: 2.96 V
Brady Statistic AP VP Percent: 22.91 %
Brady Statistic AP VS Percent: 0 %
Brady Statistic AS VP Percent: 77.07 %
Brady Statistic AS VS Percent: 0.03 %
Brady Statistic RA Percent Paced: 22.9 %
Brady Statistic RV Percent Paced: 99.97 %
Date Time Interrogation Session: 20210722092554
Implantable Lead Implant Date: 20140818
Implantable Lead Implant Date: 20140818
Implantable Lead Location: 753859
Implantable Lead Location: 753860
Implantable Lead Model: 5076
Implantable Lead Model: 5076
Implantable Pulse Generator Implant Date: 20140818
Lead Channel Impedance Value: 342 Ohm
Lead Channel Impedance Value: 456 Ohm
Lead Channel Impedance Value: 627 Ohm
Lead Channel Impedance Value: 646 Ohm
Lead Channel Pacing Threshold Amplitude: 0.375 V
Lead Channel Pacing Threshold Amplitude: 1 V
Lead Channel Pacing Threshold Pulse Width: 0.4 ms
Lead Channel Pacing Threshold Pulse Width: 0.4 ms
Lead Channel Sensing Intrinsic Amplitude: 2.25 mV
Lead Channel Sensing Intrinsic Amplitude: 2.25 mV
Lead Channel Sensing Intrinsic Amplitude: 20.75 mV
Lead Channel Sensing Intrinsic Amplitude: 20.75 mV
Lead Channel Setting Pacing Amplitude: 2 V
Lead Channel Setting Pacing Amplitude: 2.5 V
Lead Channel Setting Pacing Pulse Width: 0.4 ms
Lead Channel Setting Sensing Sensitivity: 0.9 mV

## 2019-12-07 DIAGNOSIS — F329 Major depressive disorder, single episode, unspecified: Secondary | ICD-10-CM | POA: Diagnosis not present

## 2019-12-07 DIAGNOSIS — K219 Gastro-esophageal reflux disease without esophagitis: Secondary | ICD-10-CM | POA: Diagnosis not present

## 2019-12-07 DIAGNOSIS — Z950 Presence of cardiac pacemaker: Secondary | ICD-10-CM | POA: Diagnosis not present

## 2019-12-07 DIAGNOSIS — E039 Hypothyroidism, unspecified: Secondary | ICD-10-CM | POA: Diagnosis not present

## 2019-12-07 DIAGNOSIS — R110 Nausea: Secondary | ICD-10-CM | POA: Diagnosis not present

## 2019-12-07 DIAGNOSIS — Z1212 Encounter for screening for malignant neoplasm of rectum: Secondary | ICD-10-CM | POA: Diagnosis not present

## 2019-12-07 DIAGNOSIS — Z0000 Encounter for general adult medical examination without abnormal findings: Secondary | ICD-10-CM | POA: Diagnosis not present

## 2019-12-07 DIAGNOSIS — M545 Low back pain: Secondary | ICD-10-CM | POA: Diagnosis not present

## 2019-12-07 DIAGNOSIS — D692 Other nonthrombocytopenic purpura: Secondary | ICD-10-CM | POA: Diagnosis not present

## 2019-12-07 DIAGNOSIS — E785 Hyperlipidemia, unspecified: Secondary | ICD-10-CM | POA: Diagnosis not present

## 2019-12-07 DIAGNOSIS — Z1339 Encounter for screening examination for other mental health and behavioral disorders: Secondary | ICD-10-CM | POA: Diagnosis not present

## 2019-12-07 DIAGNOSIS — J45909 Unspecified asthma, uncomplicated: Secondary | ICD-10-CM | POA: Diagnosis not present

## 2019-12-07 DIAGNOSIS — R1031 Right lower quadrant pain: Secondary | ICD-10-CM | POA: Diagnosis not present

## 2019-12-07 DIAGNOSIS — I480 Paroxysmal atrial fibrillation: Secondary | ICD-10-CM | POA: Diagnosis not present

## 2019-12-07 DIAGNOSIS — R82998 Other abnormal findings in urine: Secondary | ICD-10-CM | POA: Diagnosis not present

## 2019-12-07 DIAGNOSIS — Z1331 Encounter for screening for depression: Secondary | ICD-10-CM | POA: Diagnosis not present

## 2019-12-07 NOTE — Progress Notes (Signed)
Remote pacemaker transmission.   

## 2019-12-08 ENCOUNTER — Telehealth: Payer: Self-pay | Admitting: Internal Medicine

## 2019-12-08 DIAGNOSIS — R109 Unspecified abdominal pain: Secondary | ICD-10-CM | POA: Diagnosis not present

## 2019-12-08 DIAGNOSIS — R1013 Epigastric pain: Secondary | ICD-10-CM | POA: Diagnosis not present

## 2019-12-08 DIAGNOSIS — R1031 Right lower quadrant pain: Secondary | ICD-10-CM | POA: Diagnosis not present

## 2019-12-08 NOTE — Telephone Encounter (Signed)
Appointment scheduled with Dr Lovena Le, [place] 12/09/2019.  Per Gracy Bruins, scheduler, Anderson Malta advised with [employer] who will contact the pt and inform of appointment date and time.

## 2019-12-08 NOTE — Telephone Encounter (Signed)
Anderson Malta with [facility] (Dr. Jacquiline Doe Office) states Dr. [name] is requesting that the patient has follow up appointment with cardiology ASAP due to fluid around heart in ultrasound. Patient also reports nausea and chest pain. Appointment has been scheduled for 12/25/19 at 9:30 AM with Tommye Standard. However, Anderson Malta insisted that patient needs to be seen sooner. Made her aware I would route request to Dr. Olin Pia nurse. She states a call may be returned to discuss at [phone].

## 2019-12-09 ENCOUNTER — Other Ambulatory Visit: Payer: Self-pay

## 2019-12-09 ENCOUNTER — Encounter: Payer: Self-pay | Admitting: Internal Medicine

## 2019-12-09 ENCOUNTER — Ambulatory Visit (INDEPENDENT_AMBULATORY_CARE_PROVIDER_SITE_OTHER): Payer: Medicare Other | Admitting: Internal Medicine

## 2019-12-09 VITALS — BP 106/62 | HR 67 | Ht 66.5 in | Wt 162.6 lb

## 2019-12-09 DIAGNOSIS — I442 Atrioventricular block, complete: Secondary | ICD-10-CM | POA: Diagnosis not present

## 2019-12-09 DIAGNOSIS — I313 Pericardial effusion (noninflammatory): Secondary | ICD-10-CM | POA: Diagnosis not present

## 2019-12-09 DIAGNOSIS — Z950 Presence of cardiac pacemaker: Secondary | ICD-10-CM

## 2019-12-09 DIAGNOSIS — I3139 Other pericardial effusion (noninflammatory): Secondary | ICD-10-CM

## 2019-12-09 LAB — BASIC METABOLIC PANEL
BUN/Creatinine Ratio: 17 (ref 12–28)
BUN: 14 mg/dL (ref 8–27)
CO2: 23 mmol/L (ref 20–29)
Calcium: 9.5 mg/dL (ref 8.7–10.3)
Chloride: 102 mmol/L (ref 96–106)
Creatinine, Ser: 0.82 mg/dL (ref 0.57–1.00)
GFR calc Af Amer: 81 mL/min/{1.73_m2} (ref >59)
GFR calc non Af Amer: 70 mL/min/{1.73_m2} (ref >59)
Glucose: 91 mg/dL (ref 65–99)
Potassium: 4.3 mmol/L (ref 3.5–5.2)
Sodium: 139 mmol/L (ref 134–144)

## 2019-12-09 LAB — CUP PACEART INCLINIC DEVICE CHECK
Battery Remaining Longevity: 37 mo
Battery Voltage: 2.96 V
Brady Statistic AP VP Percent: 22.89 %
Brady Statistic AP VS Percent: 0 %
Brady Statistic AS VP Percent: 77.08 %
Brady Statistic AS VS Percent: 0.02 %
Brady Statistic RA Percent Paced: 22.89 %
Brady Statistic RV Percent Paced: 99.98 %
Date Time Interrogation Session: 20210728104600
Implantable Lead Implant Date: 20140818
Implantable Lead Implant Date: 20140818
Implantable Lead Location: 753859
Implantable Lead Location: 753860
Implantable Lead Model: 5076
Implantable Lead Model: 5076
Implantable Pulse Generator Implant Date: 20140818
Lead Channel Impedance Value: 361 Ohm
Lead Channel Impedance Value: 494 Ohm
Lead Channel Impedance Value: 665 Ohm
Lead Channel Impedance Value: 760 Ohm
Lead Channel Pacing Threshold Amplitude: 0.5 V
Lead Channel Pacing Threshold Amplitude: 1 V
Lead Channel Pacing Threshold Pulse Width: 0.4 ms
Lead Channel Pacing Threshold Pulse Width: 0.4 ms
Lead Channel Sensing Intrinsic Amplitude: 1.5 mV
Lead Channel Sensing Intrinsic Amplitude: 20.75 mV
Lead Channel Setting Pacing Amplitude: 2 V
Lead Channel Setting Pacing Amplitude: 2.5 V
Lead Channel Setting Pacing Pulse Width: 0.4 ms
Lead Channel Setting Sensing Sensitivity: 0.9 mV

## 2019-12-09 LAB — HEPATIC FUNCTION PANEL
ALT: 22 IU/L (ref 0–32)
AST: 22 IU/L (ref 0–40)
Albumin: 4.4 g/dL (ref 3.7–4.7)
Alkaline Phosphatase: 51 IU/L (ref 48–121)
Bilirubin Total: 0.5 mg/dL (ref 0.0–1.2)
Bilirubin, Direct: 0.15 mg/dL (ref 0.00–0.40)
Total Protein: 6.8 g/dL (ref 6.0–8.5)

## 2019-12-09 LAB — AMYLASE: Amylase: 50 U/L (ref 31–110)

## 2019-12-09 LAB — SEDIMENTATION RATE: Sed Rate: 4 mm/hr (ref 0–40)

## 2019-12-09 LAB — LIPASE: Lipase: 22 U/L (ref 14–85)

## 2019-12-09 NOTE — Progress Notes (Signed)
HPI [name] is referred today by Dr. Reynaldo Reynolds for evaluation of a pericardia effusion. She is a pleasant 74 yo woman with CHB, s/p PPM insertion. She has been bothered by epigastric pain and difficulty with eating. She has anorexia. Workup with an abdominal ultrasound demonstrated a small pericardia effusion. This was not seen on CT scan 3 months ago. She denies fever or chills. She tells me that her sister had pancreatic CA.  Allergies  Allergen Reactions  . Anesthetics, Amide Other (See Comments)    Patient unsure of names, however multiples cause swelling of airway & nausea  . Clindamycin Swelling  . Neomycin Swelling  . Shellfish Allergy Other (See Comments)    Neurotoxic reaction  . Sulfonamide Derivatives Anaphylaxis and Swelling  . Zolpidem Tartrate Other (See Comments)    Jerking motions   . Azithromycin Other (See Comments)    Severe gastritis   . Bactroban Other (See Comments)    Causes sores in nose  . Ciprofloxacin     REACTION: joint swelling  . Codeine Swelling  . Meperidine Hcl Nausea Only    Hallucinations   . Morphine Nausea Only    Hallucinations   . Neosporin [Neomycin-Polymyxin-Gramicidin] Hives and Dermatitis    All topical "orin's ointment"  . Nitrofurantoin     REACTION: neuropathy in legs  . Other     ALLERGIC TO WARM WATER SHELLFISH  PT STATES PLEASE NOTE SHE CAN TAKE OXYCODONE AND TYLENOL FOR PAIN -NOT ALLERGIC  . Penicillins Other (See Comments)    Swelling in joints  . Tramadol     Makes jerk  . Latex Rash     Current Outpatient Medications  Medication Sig Dispense Refill  . acetaminophen (TYLENOL) 325 MG tablet Take 1 tablet by mouth as needed for pain.    Marland Kitchen albuterol (PROVENTIL HFA;VENTOLIN HFA) 108 (90 Base) MCG/ACT inhaler Inhale 2 puffs into the lungs every 6 (six) hours as needed. For shortness of breath 1 Inhaler 1  . aspirin EC 81 MG EC tablet Take 1 tablet (81 mg total) by mouth daily.    Marland Kitchen b complex vitamins capsule  Take 1 capsule by mouth daily.     . budesonide (ENTOCORT EC) 3 MG 24 hr capsule Take 3 mg by mouth as directed.    . cetirizine (ZYRTEC) 10 MG tablet Take 10 mg by mouth 2 (two) times daily.     . cholecalciferol (VITAMIN D) 1000 UNITS tablet Take 1,000 Units by mouth daily.    . cycloSPORINE (RESTASIS) 0.05 % ophthalmic emulsion Place 1 drop into both eyes 2 (two) times daily as needed (unk).     Marland Kitchen escitalopram (LEXAPRO) 10 MG tablet Take 10 mg by mouth daily.    Marland Kitchen estradiol (ESTRING) 2 MG vaginal ring Place 2 mg vaginally every 3 (three) months. follow package directions    . fluocinonide (LIDEX) 0.05 % external solution 1 APPLICATION ON THE SKIN AS DIRECTED. APPLY TO SCALP BID FOR 1-2 WEEKS PRN FLARE AS DIRECTED  1  . fluticasone (FLONASE) 50 MCG/ACT nasal spray Place 1 spray into both nostrils daily. 48 g 1  . levothyroxine (SYNTHROID) 88 MCG tablet Take 88 mcg by mouth daily.    . Magnesium 500 MG CAPS Take 1 capsule by mouth daily.    . meclizine (ANTIVERT) 25 MG tablet Take 12.5 mg by mouth 3 (three) times daily as needed for dizziness.    . Melatonin 1 MG TABS Take 1.5 tablets by mouth  at bedtime as needed.     . midodrine (PROAMATINE) 5 MG tablet Take 1 tablet by mouth every 3 (three) hours. Takes 4-5 times daily from waking until no later than 7 pm.    . Pentosan Polysulfate Sodium (ELMIRON PO) Take 1 capsule by mouth 2 (two) times daily.    . potassium chloride (KLOR-CON) 10 MEQ tablet TAKE 1 TABLET DAILY 90 tablet 3  . Probiotic Product (ALIGN) 4 MG CAPS Take 4 mg by mouth at bedtime.     . RABEprazole (ACIPHEX) 20 MG tablet TAKE 1 TABLET DAILY 30 tablet 0  . rosuvastatin (CRESTOR) 10 MG tablet Take 1 tablet (10 mg total) by mouth daily. 90 tablet 0  . vitamin C (ASCORBIC ACID) 500 MG tablet Take 500 mg by mouth daily.      No current facility-administered medications for this visit.   Facility-Administered Medications Ordered in Other Visits  Medication Dose Route Frequency  Provider Last Rate Last Admin  . bupivacaine (MARCAINE) 0.5 % 10 mL, triamcinolone acetonide (KENALOG-40) 40 mg injection   Subcutaneous Once Courtney Clines, MD      . bupivacaine (MARCAINE) 0.5 % 15 mL, phenazopyridine (PYRIDIUM) 400 mg bladder mixture   Bladder Instillation Once Courtney Clines, MD         Past Medical History:  Diagnosis Date  . ADHD (attention deficit hyperactivity disorder)   . Allergy    trees/pollen, mold, fungus, dust mites. Takes allergy shots  . Arthritis    PAIN AND OA LEFT HIP  . Asthma    allergist Dr Olena Reynolds- monthly allergy injections  . Autonomic dysfunction    CENTRAL NERVOUS SYSTEM NEUROPATHY - DX BY [name] MORE THAN 10 YRS AGO - AND IT IS FELT TO CONTRIBUTE TO THE AUTOMIC  DYSFUNCTION-- PT HAS NUMBNESS LEGS AND FEET AND SOMETIIMES TIPS OF FINGER, SEVERE CONSTIPATION( NO SENSATION TO HAVE BM ), DOUBLE VISION, ORTHOSTATIC HYPOTENSION  . Blood transfusion   . Breast cancer ([place])    right side  . Bruises easily   . Cataract   . Chest pain    a. 12/2012 Cath: nl cors, EF 55-65%.  . Complication of anesthesia    reaction to some anesthetics/ 7/12 anesth record on chart- states prefers epidural  . CVA (cerebrovascular accident) ([place]) 03/21/2018  . Depression   . Diverticulosis   . Dysrhythmia    HX OF HIGH GRADE HEART BLOCK - REQUIRED PACEMAKER INSERTION  . Esophageal stricture   . Gastritis   . GERD (gastroesophageal reflux disease)   . H/O hiatal hernia   . Hemorrhoids   . Hernia of abdominal wall    spigelian hernia RLQ - SURGERY TO REPAIR  . Hypothyroidism   . Interstitial cystitis   . Latex allergy, contact dermatitis   . Mallory - Weiss tear    HEALED   . Neuromuscular disorder (H[facility])    central nervous system neuropathy- seen per Dr Erling Reynolds  . Pacemaker   . Pericardial effusion    a. 12/2012 following ppm placement;  b. 01/01/2013 Echo: EF 55-60%, small pericardial effusion w/o RV collapse-->No need for tap/window.  Marland Kitchen PONV  (postoperative nausea and vomiting)    pt needs scop patch  . Recurrent upper respiratory infection (URI) 1/13- to present   bronchitis following surgery- states improved but still with cough. OV with [facility] Dr [name] 09/06/11 on chart  . Shortness of breath    AT TIMES - BUT MUCH IMPROVED AFTER PACEMAKER WAS REPROGRAMED.  Marland Kitchen Symptomatic bradycardia  a. 12/2012 s/p MDT dual chamber PPM, ser # [id number]; b. 12/2012 post-op course complicated by pericardial effusinon req lead revision.  . Thyroid disease     ROS:   All systems reviewed and negative except as noted in the HPI.   Past Surgical History:  Procedure Laterality Date  . ABDOMINAL HYSTERECTOMY  1974  . BACK SURGERY     cervical fusion 4-5 with plate  . BLADDER SUSPENSION    . BREAST BIOPSY  2002   NO BLOOD PRESSURES ON RIGHT SIDE/   s/p  axillary node dissection  . CYSTO WITH HYDRODISTENSION  09/07/2011   Procedure: CYSTOSCOPY/HYDRODISTENSION;  Surgeon: Ailene Rud, MD;  Location: [facility];  Service: Urology;  Laterality: N/A;  INSTILLATION OF MARCAINE/PYRIDIUM INSTILLATION OF MARCAINE/KENALOG  . CYSTOSCOPY  1975, 2007  . EYE SURGERY     LASIK EYE SURGERY BILATERAL  . LAPAROSCOPY  1973  . LEAD REVISION N/A 12/30/2012   Procedure: LEAD REVISION;  Surgeon: Evans Lance, MD;  Location[facility] CATH LAB;  Service: Cardiovascular;  Laterality: N/A;  . LEFT HEART CATHETERIZATION WITH CORONARY ANGIOGRAM N/A 12/29/2012   Procedure: LEFT HEART CATHETERIZATION WITH CORONARY ANGIOGRAM;  Surgeon: Peter M Martinique, MD;  Location: [facility] CATH LAB;  Service: Cardiovascular;  Laterality: N/A;  . MASTECTOMY MODIFIED RADICAL     right; with immediate reconstruction  . MYRINGOPLASTY  1962  . NECK SURGERY     c4-5 ruptured disc  . OOPHORECTOMY  1982  . PACEMAKER INSERTION    . PERMANENT PACEMAKER INSERTION N/A 12/29/2012   Procedure: PERMANENT PACEMAKER INSERTION;  Surgeon: Deboraha Sprang, MD;  Location: [facility] CATH LAB;  Service:  Cardiovascular;  Laterality: N/A;  . RECTOCELE REPAIR     with cystocele repair  . TONSILLECTOMY    . TOTAL HIP ARTHROPLASTY Right   . TOTAL HIP ARTHROPLASTY Left 06/12/2013   Procedure: LEFT TOTAL HIP ARTHROPLASTY ANTERIOR APPROACH;  Surgeon: Mcarthur Rossetti, MD;  Location: [facility];  Service: Orthopedics;  Laterality: Left;  . TYMPANOPLASTY  1973   right  . ULNAR NERVE TRANSPOSITION Right   . VENTRAL HERNIA REPAIR  05/30/2011   Procedure: HERNIA REPAIR VENTRAL ADULT;  Surgeon: Haywood Lasso, MD;  Location: [facility];  Service: General;  Laterality: Right;  repair right spigelian hernia     Family History  Problem Relation Age of Onset  . Heart disease Father   . Colon cancer Mother 87  . Depression Mother   . Pancreatic cancer Sister 92  . Diverticulitis Sister   . Uterine cancer Maternal Grandmother   . Heart disease Brother   . Heart disease Paternal Grandmother   . Heart disease Paternal Grandfather   . Throat cancer Maternal Uncle   . Heart disease Paternal Aunt   . Heart disease Paternal Uncle   . Heart disease Brother   . Thrombosis Maternal Aunt   . Cancer Maternal Uncle        NOS  . Diabetes Other        aunt     Social History   Socioeconomic History  . Marital status: Married    Spouse name: Not on file  . Number of children: 2  . Years of education: 91  . Highest education level: Not on file  Occupational History  . Occupation: Retired    Fish farm manager: [employer]: [employer]    Employer: [employer]  Tobacco Use  .  Smoking status: Never Smoker  . Smokeless tobacco: Never Used  Vaping Use  . Vaping Use: Never used  Substance and Sexual Activity  . Alcohol use: Yes    Alcohol/week: 7.0 standard drinks    Types: 7 Glasses of wine per week    Comment: occasional  . Drug use: No  . Sexual activity: Not on file  Other Topics Concern  . Not on file  Social History  Narrative   Lives at home w/ her husband   Patient drinks 1-2 cup of caffeine daily.   Patient is right handed.   Social Determinants of Health   Financial Resource Strain:   . Difficulty of Paying Living Expenses:   Food Insecurity:   . Worried About Charity fundraiser in the Last Year:   . Arboriculturist in the Last Year:   Transportation Needs:   . Film/video editor (Medical):   Marland Kitchen Lack of Transportation (Non-Medical):   Physical Activity:   . Days of Exercise per Week:   . Minutes of Exercise per Session:   Stress:   . Feeling of Stress :   Social Connections:   . Frequency of Communication with Friends and Family:   . Frequency of Social Gatherings with Friends and Family:   . Attends Religious Services:   . Active Member of Clubs or Organizations:   . Attends Archivist Meetings:   Marland Kitchen Marital Status:   Intimate Partner Violence:   . Fear of Current or Ex-Partner:   . Emotionally Abused:   Marland Kitchen Physically Abused:   . Sexually Abused:      BP (!) 106/62   Pulse 67   Ht 5' 6.5" (1.689 m)   Wt 162 lb 9.6 oz (73.8 kg)   SpO2 94%   BMI 25.85 kg/m   Physical Exam:  Well appearing NAD HEENT: Unremarkable Neck:  No JVD, no thyromegally Lymphatics:  No adenopathy Back:  No CVA tenderness Lungs:  Clear HEART:  Regular rate rhythm, no murmurs, no rubs, no clicks Abd:  soft, positive bowel sounds, no organomegally, marked epigastric tenderness Ext:  2 plus pulses, no edema, no cyanosis, no clubbing Skin:  No rashes no nodules Neuro:  CN II through XII intact, motor grossly intact  EKG - nsr with pacing induced LBBB  DEVICE  Normal device function.  See PaceArt for details.   Assess/Plan: 1. Pericardial effusion - she does not have clinic evidence of pericarditis. We will obtain a 2D echo to better characterized the extent of her effusion and to determine if there is any additional findings or evidence of hemodynamic consequence. 2. Epigastric pain  - I discussed with Dr. Reynaldo Reynolds today. Her workup is ongoing. She had a CT scan 3 months ago that did not show any obvious etiology. We ordered more blood work today. 3. CHB - she is asymptomatic, s/p PPM Insertion. 4. Weight loss - her appetite has been down. She has lost 4 lbs.   [email or web address].

## 2019-12-09 NOTE — Patient Instructions (Addendum)
Medication Instructions:  Your physician recommends that you continue on your current medications as directed. Please refer to the Current Medication list given to you today.  *If you need a refill on your cardiac medications before your next appointment, please call your pharmacy*  Lab Work: You will get lab work today  BMP, sed rate, Liver panel, Amylase, Lipase  If you have labs (blood work) drawn today and your tests are completely normal, you will receive your results only by: Marland Kitchen MyChart Message (if you have MyChart) OR . A paper copy in the mail If you have any lab test that is abnormal or we need to change your treatment, we will call you to review the results.  Testing/Procedures: Your ECHO is scheduled for: 12/10/2019 at 7:30am at [address] office. Please arrive by 7:15am  Follow-Up: At [facility], you and your health needs are our priority.  As part of our continuing mission to provide you with exceptional heart care, we have created designated Provider Care Teams.  These Care Teams include your primary Cardiologist (physician) and Advanced Practice Providers (APPs -  Physician Assistants and Nurse Practitioners) who all work together to provide you with the care you need, when you need it.  We recommend signing up for the patient portal called "MyChart".  Sign up information is provided on this After Visit Summary.  MyChart is used to connect with patients for Virtual Visits (Telemedicine).  Patients are able to view lab/test results, encounter notes, upcoming appointments, etc.  Non-urgent messages can be sent to your provider as well.   To learn more about what you can do with MyChart, go to [email or web address].    Your next appointment:   Your physician wants you to follow-up in: Based on Echo results.  Remote monitoring is used to monitor your Pacemaker from home. This monitoring reduces the number of office visits required to check your device to one time per year. It  allows [place] to keep an eye on the functioning of your device to ensure it is working properly. You are scheduled for a device check from home on 03/03/20. You may send your transmission at any time that day. If you have a wireless device, the transmission will be sent automatically. After your physician reviews your transmission, you will receive a postcard with your next transmission date.  Other Instructions:

## 2019-12-10 ENCOUNTER — Ambulatory Visit (HOSPITAL_COMMUNITY): Payer: Medicare Other | Attending: Cardiovascular Disease

## 2019-12-10 DIAGNOSIS — Z950 Presence of cardiac pacemaker: Secondary | ICD-10-CM

## 2019-12-10 DIAGNOSIS — I313 Pericardial effusion (noninflammatory): Secondary | ICD-10-CM | POA: Insufficient documentation

## 2019-12-10 DIAGNOSIS — I3139 Other pericardial effusion (noninflammatory): Secondary | ICD-10-CM

## 2019-12-10 DIAGNOSIS — I442 Atrioventricular block, complete: Secondary | ICD-10-CM | POA: Diagnosis not present

## 2019-12-10 LAB — ECHOCARDIOGRAM COMPLETE
Area-P 1/2: 3.97 cm2
S' Lateral: 2.5 cm

## 2019-12-11 NOTE — Addendum Note (Signed)
Addended by: Rose Phi on: 12/11/2019 09:46 AM   Modules accepted: Orders

## 2019-12-14 ENCOUNTER — Other Ambulatory Visit: Payer: Self-pay | Admitting: Internal Medicine

## 2019-12-14 DIAGNOSIS — R1031 Right lower quadrant pain: Secondary | ICD-10-CM

## 2019-12-14 DIAGNOSIS — R1013 Epigastric pain: Secondary | ICD-10-CM

## 2019-12-14 DIAGNOSIS — R110 Nausea: Secondary | ICD-10-CM

## 2019-12-16 ENCOUNTER — Other Ambulatory Visit: Payer: Self-pay

## 2019-12-16 ENCOUNTER — Encounter (HOSPITAL_COMMUNITY)
Admission: RE | Admit: 2019-12-16 | Discharge: 2019-12-16 | Disposition: A | Discharge status: Home / Self Care | Payer: Medicare Other | Source: Ambulatory Visit | Attending: Internal Medicine | Admitting: Internal Medicine

## 2019-12-16 DIAGNOSIS — R112 Nausea with vomiting, unspecified: Secondary | ICD-10-CM | POA: Diagnosis not present

## 2019-12-16 DIAGNOSIS — R110 Nausea: Secondary | ICD-10-CM | POA: Diagnosis not present

## 2019-12-16 DIAGNOSIS — R1031 Right lower quadrant pain: Secondary | ICD-10-CM

## 2019-12-16 DIAGNOSIS — R1013 Epigastric pain: Secondary | ICD-10-CM | POA: Insufficient documentation

## 2019-12-16 DIAGNOSIS — R109 Unspecified abdominal pain: Secondary | ICD-10-CM | POA: Diagnosis not present

## 2019-12-16 MED ORDER — TECHNETIUM TC 99M MEBROFENIN IV KIT
5.2000 | PACK | Freq: Once | INTRAVENOUS | Status: AC | PRN
  Administered 2019-12-16: 5.2 via INTRAVENOUS

## 2019-12-25 ENCOUNTER — Encounter: Payer: Medicare Other | Admitting: Physician Assistant

## 2020-01-05 MED ORDER — MIDODRINE HCL 5 MG PO TABS: 5.0000 mg | ORAL_TABLET | Freq: Four times a day (QID) | ORAL | 3 refills | Status: DC

## 2020-01-13 DIAGNOSIS — R1310 Dysphagia, unspecified: Secondary | ICD-10-CM | POA: Diagnosis not present

## 2020-01-15 ENCOUNTER — Other Ambulatory Visit: Payer: Self-pay | Admitting: General Surgery

## 2020-01-15 ENCOUNTER — Other Ambulatory Visit (HOSPITAL_COMMUNITY): Payer: Self-pay | Admitting: General Surgery

## 2020-01-15 DIAGNOSIS — R1310 Dysphagia, unspecified: Secondary | ICD-10-CM

## 2020-01-25 ENCOUNTER — Other Ambulatory Visit: Payer: Self-pay

## 2020-01-25 ENCOUNTER — Encounter (HOSPITAL_COMMUNITY)
Admission: RE | Admit: 2020-01-25 | Discharge: 2020-01-25 | Disposition: A | Discharge status: Home / Self Care | Payer: Medicare Other | Source: Ambulatory Visit | Attending: General Surgery | Admitting: General Surgery

## 2020-01-25 DIAGNOSIS — R112 Nausea with vomiting, unspecified: Secondary | ICD-10-CM | POA: Diagnosis not present

## 2020-01-25 DIAGNOSIS — R1013 Epigastric pain: Secondary | ICD-10-CM | POA: Diagnosis not present

## 2020-01-25 DIAGNOSIS — R140 Abdominal distension (gaseous): Secondary | ICD-10-CM | POA: Diagnosis not present

## 2020-01-25 DIAGNOSIS — R197 Diarrhea, unspecified: Secondary | ICD-10-CM | POA: Diagnosis not present

## 2020-01-25 DIAGNOSIS — R1310 Dysphagia, unspecified: Secondary | ICD-10-CM | POA: Diagnosis not present

## 2020-01-25 MED ORDER — TECHNETIUM TC 99M SULFUR COLLOID
2.0000 | Freq: Once | INTRAVENOUS | Status: AC | PRN
  Administered 2020-01-25: 2 via INTRAVENOUS

## 2020-01-27 ENCOUNTER — Ambulatory Visit: Payer: Self-pay | Admitting: General Surgery

## 2020-01-27 DIAGNOSIS — K449 Diaphragmatic hernia without obstruction or gangrene: Secondary | ICD-10-CM | POA: Diagnosis not present

## 2020-01-27 DIAGNOSIS — K2100 Gastro-esophageal reflux disease with esophagitis, without bleeding: Secondary | ICD-10-CM | POA: Diagnosis not present

## 2020-01-27 NOTE — H&P (Signed)
History of Present Illness Ralene Ok MD; 01/27/2020 4:04 PM) The patient is a 74 year old female who presents with a complaint of epigastric abdominal pain. Patient is a 74 year old female PulsePak today secondary to epigastric abdominal pain. I was able to receive the patient's esophageal manometry report as well as pH probe study from [facility] this was from January 2019. Patient states that since that time her reflux has increased in severity. Patient's manometry test reveals 50% or more ineffective swallows. There is a normal lower esophageal sphincter tone, patient pH probe study gives her a DeMeester score of 13 with normal being less than 22. Patient underwent gastric emptying study which revealed normal gastric emptying.  Patient comments today that she continues with early satiety, reflux, as a burning, and epigastric pain.     ------------------------ Patient is a 74 year old female who comes in secondary to referral secondary to epigastric abdominal pain. Patient comes in with a significant history of recent dysphagia, reflux symptoms, shortness of breath. Patient had previous workup at [facility] [place] to include endoscopy, manometry, and pH probe study. I do not have results of the manometry and/or pH probe study. She also presented on ultrasound as well as CT scan. I do not have those results currently. We will recheck to obtain these results.  Patient states that she does have some dysphagia, reflux type symptoms. She does state that she has difficulty with liquids and solids. She states that she has emesis of gastric acid however no solid food. This is also complicated by a history of orthostatic hypotension.  Patient states that at some point in the past she has had a study for gastric emptying however this was in [place] in the remote past.    She's had multiple previous abdominal operations to include spigelian repair as well as cystocele repair   Allergies (Chanel  Teressa Senter, CMA; 01/27/2020 3:25 PM) Anesthetics, Amide  Clindamycin  Neomycin  Shellfish  Sulfonamide Derivatives  Ambien *HYPNOTICS/SEDATIVES/SLEEP DISORDER AGENTS*  Azithromycin *CHEMICALS*  Bactroban *DERMATOLOGICALS*  Ciprofloxacin *CHEMICALS*  Codeine Phosphate *ANALGESICS - OPIOID*  Meperidine HCl *ANALGESICS - OPIOID*  Morphine Sulfate (Concentrate) *ANALGESICS - OPIOID*  Neosporin *DERMATOLOGICALS*  Nitrofurantoin *ANTI-INFECTIVE AGENTS - MISC.*  Penicillins  Tramadol  Latex   Medication History (Chanel Teressa Senter, CMA; 01/27/2020 3:25 PM) Vitamin C (500MG  Tablet Chewable, Oral) Active. Rosuvastatin Calcium (10MG  Tablet, Oral) Active. RABEprazole Sodium (20MG  Tablet DR, Oral) Active. Potassium Chloride Crys ER (10MEQ Tablet ER, Oral) Active. Midodrine HCl (5MG  Tablet, Oral) Active. Melatonin (1MG  Tablet, Oral) Active. Magnesium (500MG  Tablet, Oral) Active. Levothyroxine Sodium (88MCG Tablet, Oral) Active. Flonase Allergy Relief (50MCG/ACT Suspension, Nasal) Active. Lidex (0.05% Solution, External) Active. Estring (2MG  Ring, Vaginal) Active. Escitalopram Oxalate (10MG  Tablet, Oral) Active. Vitamin D (1000UNIT Tablet, Oral) Active. ZyrTEC (10MG  Tablet, Oral) Active. Budesonide (3MG  Capsule DR Part, Oral) Active. B Complex (Oral) Active. Aspirin (81MG  Tablet, Oral) Active. Align (4MG  Capsule, Oral) Active. Medications Reconciled    Review of Systems Ralene Ok, MD; 01/27/2020 4:6 PM) General Present- Appetite Loss and Weight Loss. Not Present- Chills, Fatigue, Fever, Night Sweats and Weight Gain. Skin Present- Dryness. Not Present- Change in Wart/Mole, Hives, Jaundice, New Lesions, Non-Healing Wounds, Rash and Ulcer. HEENT Present- Hearing Loss and Ringing in the Ears. Not Present- Earache, Hoarseness, Nose Bleed, Oral Ulcers, Seasonal Allergies, Sinus Pain, Sore Throat, Visual Disturbances, Wears glasses/contact lenses and Yellow  Eyes. Respiratory Not Present- Bloody sputum, Chronic Cough, Difficulty Breathing, Snoring and Wheezing. Cardiovascular Present- Shortness of Breath. Not Present- Chest Pain, Difficulty Breathing  Lying Down, Leg Cramps, Palpitations, Rapid Heart Rate and Swelling of Extremities. Gastrointestinal Present- Abdominal Pain, Bloating, Chronic diarrhea, Gets full quickly at meals, Nausea and Vomiting. Not Present- Bloody Stool, Change in Bowel Habits, Constipation, Difficulty Swallowing, Excessive gas, Hemorrhoids, Indigestion and Rectal Pain. Female Genitourinary Present- Nocturia and Urgency. Not Present- Frequency, Painful Urination and Pelvic Pain. Musculoskeletal Present- Joint Stiffness and Muscle Weakness. Not Present- Back Pain, Joint Pain, Muscle Pain and Swelling of Extremities. Neurological Present- Weakness. Not Present- Decreased Memory, Fainting, Headaches, Numbness, Seizures, Tingling, Tremor and Trouble walking. Psychiatric Not Present- Anxiety, Bipolar, Change in Sleep Pattern, Depression, Fearful and Frequent crying. Endocrine Not Present- Cold Intolerance, Excessive Hunger, Hair Changes, Heat Intolerance, Hot flashes and New Diabetes. Hematology Present- Blood Thinners and Easy Bruising. Not Present- Excessive bleeding, Gland problems, HIV and Persistent Infections.  Vitals (Chanel Nolan CMA; 01/27/2020 3:26 PM) 01/27/2020 3:25 PM Weight: 158 lb Height: 67in Body Surface Area: 1.83 m Body Mass Index: 24.75 kg/m  Temp.: 21F  Pulse: 74 (Regular)  BP: 114/74(Sitting, Left Arm, Standard)       Physical Exam Ralene Ok, MD; 01/27/2020 4:6 PM) The physical exam findings are as follows: Note: Constitutional: No acute distress, conversant, appears stated age  Eyes: Anicteric sclerae, moist conjunctiva, no lid lag  Neck: No thyromegaly, trachea midline, no cervical lymphadenopathy  Lungs: Clear to auscultation biilaterally, normal respiratory  effot  Cardiovascular: regular rate & rhythm, no murmurs, no peripheal edema, pedal pulses 2+  GI: Soft, no masses or hepatosplenomegaly, non-tender to palpation  MSK: Normal gait, no clubbing cyanosis, edema  Skin: No rashes, palpation reveals normal skin turgor  Psychiatric: Appropriate judgment and insight, oriented to person, place, and time    Assessment & Plan Ralene Ok MD; 01/27/2020 4:06 PM) CHRONIC REFLUX ESOPHAGITIS (K21.00) Impression: Patient is a 74 year old female, with chronic reflux, small hiatal hernia, with some added dysphasia. In looking at her labs and discussed with her the increased severity it appears that the patient likely is suffering from reflux likely caused from hiatal hernia. I discussed with her that her pictures complex secondary to the fact that some of her studies, manometry, pH probe study could be done off-duty hiatal hernia. I believe her early satiety, dysphagia is likely due to her hiatal hernia.  1. At this time would recommend robotic hiatal hernia repair with mesh and partial fundoplication. 2. Discussed with patient the risks and benefits of the procedure to include but not limited to: Infection, bleeding, damage to structures, possible pneumothorax, possible recurrence. The patient voiced understanding and wishes to proceed. HIATAL HERNIA (K44.9)

## 2020-01-29 ENCOUNTER — Other Ambulatory Visit: Payer: Self-pay | Admitting: General Surgery

## 2020-01-29 DIAGNOSIS — K449 Diaphragmatic hernia without obstruction or gangrene: Secondary | ICD-10-CM

## 2020-02-01 ENCOUNTER — Ambulatory Visit
Admission: RE | Admit: 2020-02-01 | Discharge: 2020-02-01 | Disposition: A | Discharge status: Home / Self Care | Payer: Medicare Other | Source: Ambulatory Visit | Attending: General Surgery | Admitting: General Surgery

## 2020-02-01 DIAGNOSIS — K219 Gastro-esophageal reflux disease without esophagitis: Secondary | ICD-10-CM | POA: Diagnosis not present

## 2020-02-01 DIAGNOSIS — K449 Diaphragmatic hernia without obstruction or gangrene: Secondary | ICD-10-CM

## 2020-02-08 NOTE — Progress Notes (Signed)
A and a and     Patient Care Team: Burnard Bunting, MD as PCP - General (Internal Medicine) Chucky May, MD as Consulting Physician (Psychiatry)   HPI  [name] is a 74 y.o. female Seen in followup for high grade heart block for which she underwent pacing (2014 Medtronic )compllicated by microperforation modest effusion and then adderall withdrawal; now with complete heart block Previous complaints of dyspnea were resolved with reprogramming And c/o Chest pain >> CTA neg (See Below)   Seen intercurrently 7/21 GT for small pericardial effusion..  She reminded that she has a diagnosis of central polyneuropathy;    She has been followed at [facility] with Marilynne Drivers. She was last seen 6/19 Salt and proamatine were again recommended with up titration of her dose to 4 times a day.    GI issues, followed by [facility] it has been decided that she has a hiatal hernia which will require surgical repair.  Now complaining of shortness of breath.  This can be limited to walking across the room.  Denies peripheral edema nocturnal dyspnea or orthopnea.  No attendant chest pain.    DATE TEST EF   9/14 Echo   55-60 %   12/19 Echo   55-60 %   2/21 CTA  CaScore 2 Min CAD  7/21 Echo  50-55% Pericardial effusion Trivial    Date Cr K Hgb  6/20 0.8 4.0 13.8                  Past Medical History:  Diagnosis Date  . ADHD (attention deficit hyperactivity disorder)   . Allergy    trees/pollen, mold, fungus, dust mites. Takes allergy shots  . Arthritis    PAIN AND OA LEFT HIP  . Asthma    allergist Dr Olena Heckle- monthly allergy injections  . Autonomic dysfunction    CENTRAL NERVOUS SYSTEM NEUROPATHY - DX BY DR. Erling Cruz MORE THAN 10 YRS AGO - AND IT IS FELT TO CONTRIBUTE TO THE AUTOMIC  DYSFUNCTION-- PT HAS NUMBNESS LEGS AND FEET AND SOMETIIMES TIPS OF FINGER, SEVERE CONSTIPATION( NO SENSATION TO HAVE BM ), DOUBLE VISION, ORTHOSTATIC HYPOTENSION  . Blood transfusion   .  Breast cancer ([place])    right side  . Bruises easily   . Cataract   . Chest pain    a. 12/2012 Cath: nl cors, EF 55-65%.  . Complication of anesthesia    reaction to some anesthetics/ 7/12 anesth record on chart- states prefers epidural  . CVA (cerebrovascular accident) ([place]) 03/21/2018  . Depression   . Diverticulosis   . Dysrhythmia    HX OF HIGH GRADE HEART BLOCK - REQUIRED PACEMAKER INSERTION  . Esophageal stricture   . Gastritis   . GERD (gastroesophageal reflux disease)   . H/O hiatal hernia   . Hemorrhoids   . Hernia of abdominal wall    spigelian hernia RLQ - SURGERY TO REPAIR  . Hypothyroidism   . Interstitial cystitis   . Latex allergy, contact dermatitis   . Mallory - Weiss tear    HEALED   . Neuromuscular disorder (HCC)    central nervous system neuropathy- seen per Dr Erling Cruz  . Pacemaker   . Pericardial effusion    a. 12/2012 following ppm placement;  b. 01/01/2013 Echo: EF 55-60%, small pericardial effusion w/o RV collapse-->No need for tap/window.  Marland Kitchen PONV (postoperative nausea and vomiting)    pt needs scop patch  . Recurrent upper respiratory infection (URI) 1/13- to present  bronchitis following surgery- states improved but still with cough. OV with [facility] Dr [name] 09/06/11 on chart  . Shortness of breath    AT TIMES - BUT MUCH IMPROVED AFTER PACEMAKER WAS REPROGRAMED.  Marland Kitchen Symptomatic bradycardia    a. 12/2012 s/p MDT dual chamber PPM, ser # [id number]; b. 12/2012 post-op course complicated by pericardial effusinon req lead revision.  . Thyroid disease     Past Surgical History:  Procedure Laterality Date  . ABDOMINAL HYSTERECTOMY  1974  . BACK SURGERY     cervical fusion 4-5 with plate  . BLADDER SUSPENSION    . BREAST BIOPSY  2002   NO BLOOD PRESSURES ON RIGHT SIDE/   s/p  axillary node dissection  . CYSTO WITH HYDRODISTENSION  09/07/2011   Procedure: CYSTOSCOPY/HYDRODISTENSION;  Surgeon: Ailene Rud, MD;  Location: [facility];  Service: Urology;   Laterality: N/A;  INSTILLATION OF MARCAINE/PYRIDIUM INSTILLATION OF MARCAINE/KENALOG  . CYSTOSCOPY  1975, 2007  . EYE SURGERY     LASIK EYE SURGERY BILATERAL  . LAPAROSCOPY  1973  . LEAD REVISION N/A 12/30/2012   Procedure: LEAD REVISION;  Surgeon: Evans Lance, MD;  Location: [facility] CATH LAB;  Service: Cardiovascular;  Laterality: N/A;  . LEFT HEART CATHETERIZATION WITH CORONARY ANGIOGRAM N/A 12/29/2012   Procedure: LEFT HEART CATHETERIZATION WITH CORONARY ANGIOGRAM;  Surgeon: Peter M Martinique, MD;  Location: [facility] CATH LAB;  Service: Cardiovascular;  Laterality: N/A;  . MASTECTOMY MODIFIED RADICAL     right; with immediate reconstruction  . MYRINGOPLASTY  1962  . NECK SURGERY     c4-5 ruptured disc  . OOPHORECTOMY  1982  . PACEMAKER INSERTION    . PERMANENT PACEMAKER INSERTION N/A 12/29/2012   Procedure: PERMANENT PACEMAKER INSERTION;  Surgeon: Deboraha Sprang, MD;  Location: [facility] CATH LAB;  Service: Cardiovascular;  Laterality: N/A;  . RECTOCELE REPAIR     with cystocele repair  . TONSILLECTOMY    . TOTAL HIP ARTHROPLASTY Right   . TOTAL HIP ARTHROPLASTY Left 06/12/2013   Procedure: LEFT TOTAL HIP ARTHROPLASTY ANTERIOR APPROACH;  Surgeon: Mcarthur Rossetti, MD;  Location: [facility];  Service: Orthopedics;  Laterality: Left;  . TYMPANOPLASTY  1973   right  . ULNAR NERVE TRANSPOSITION Right   . VENTRAL HERNIA REPAIR  05/30/2011   Procedure: HERNIA REPAIR VENTRAL ADULT;  Surgeon: Haywood Lasso, MD;  Location: [place];  Service: General;  Laterality: Right;  repair right spigelian hernia    Allergies as of 02/09/2020      Reactions   Anesthetics, Amide Other (See Comments)   Patient unsure of names, however multiples cause swelling of airway & nausea   Clindamycin Swelling   Neomycin Swelling   Shellfish Allergy Other (See Comments)   Neurotoxic reaction   Sulfonamide Derivatives Anaphylaxis, Swelling   Zolpidem Tartrate Other (See Comments)   Jerking motions     Azithromycin Other (See Comments)   Severe gastritis    Bactroban Other (See Comments)   Causes sores in nose   Ciprofloxacin    REACTION: joint swelling   Codeine Swelling   Meperidine Hcl Nausea Only   Hallucinations   Morphine Nausea Only   Hallucinations    Neosporin [neomycin-polymyxin-gramicidin] Hives, Dermatitis   All topical "orin's ointment"   Nitrofurantoin    REACTION: neuropathy in legs   Other    ALLERGIC TO WARM WATER SHELLFISH PT STATES PLEASE NOTE SHE CAN TAKE OXYCODONE AND TYLENOL FOR PAIN -NOT ALLERGIC   Penicillins Other (  See Comments)   Swelling in joints   Tramadol    Makes jerk   Latex Rash      Medication List     cycloSPORINE 0.05 % ophthalmic emulsion Commonly known as: RESTASIS Stopped by: Virl Axe, MD   fluocinonide 0.05 % external solution Commonly known as: LIDEX Stopped by: Virl Axe, MD     acetaminophen 325 MG tablet Commonly known as: TYLENOL Take 1 tablet by mouth as needed for pain.   albuterol 108 (90 Base) MCG/ACT inhaler Commonly known as: VENTOLIN HFA Inhale 2 puffs into the lungs every 6 (six) hours as needed. For shortness of breath   Align 4 MG Caps Take 4 mg by mouth at bedtime.   aspirin 81 MG EC tablet Take 1 tablet (81 mg total) by mouth daily.   b complex vitamins capsule Take 1 capsule by mouth daily.   budesonide 3 MG 24 hr capsule Commonly known as: ENTOCORT EC Take 3 mg by mouth as directed.   cetirizine 10 MG tablet Commonly known as: ZYRTEC Take 10 mg by mouth 2 (two) times daily.   cholecalciferol 1000 units tablet Commonly known as: VITAMIN D Take 1,000 Units by mouth daily.   ELMIRON PO Take 1 capsule by mouth 2 (two) times daily.   escitalopram 10 MG tablet Commonly known as: LEXAPRO Take 10 mg by mouth daily.   estradiol 2 MG vaginal ring Commonly known as: ESTRING Place 2 mg vaginally every 3 (three) months. follow package directions   levothyroxine 88 MCG tablet Commonly  known as: SYNTHROID Take 88 mcg by mouth daily.   Magnesium 500 MG Caps Take 1 capsule by mouth daily.   meclizine 25 MG tablet Commonly known as: ANTIVERT Take 12.5 mg by mouth 3 (three) times daily as needed for dizziness.   melatonin 1 MG Tabs tablet Take 1.5 tablets by mouth at bedtime as needed.   midodrine 5 MG tablet Commonly known as: PROAMATINE Take 1 tablet (5 mg total) by mouth 4 (four) times daily.   ondansetron 4 MG tablet Commonly known as: ZOFRAN Take 4 mg by mouth daily.   potassium chloride 10 MEQ tablet Commonly known as: KLOR-CON TAKE 1 TABLET DAILY   RABEprazole 20 MG tablet Commonly known as: ACIPHEX TAKE 1 TABLET DAILY   rosuvastatin 10 MG tablet Commonly known as: Crestor Take 1 tablet (10 mg total) by mouth daily.        Allergies  Allergen Reactions  . Anesthetics, Amide Other (See Comments)    Patient unsure of names, however multiples cause swelling of airway & nausea  . Clindamycin Swelling  . Neomycin Swelling  . Shellfish Allergy Other (See Comments)    Neurotoxic reaction  . Sulfonamide Derivatives Anaphylaxis and Swelling  . Zolpidem Tartrate Other (See Comments)    Jerking motions   . Azithromycin Other (See Comments)    Severe gastritis   . Bactroban Other (See Comments)    Causes sores in nose  . Ciprofloxacin     REACTION: joint swelling  . Codeine Swelling  . Meperidine Hcl Nausea Only    Hallucinations   . Morphine Nausea Only    Hallucinations   . Neosporin [Neomycin-Polymyxin-Gramicidin] Hives and Dermatitis    All topical "orin's ointment"  . Nitrofurantoin     REACTION: neuropathy in legs  . Other     ALLERGIC TO WARM WATER SHELLFISH  PT STATES PLEASE NOTE SHE CAN TAKE OXYCODONE AND TYLENOL FOR PAIN -NOT ALLERGIC  . Penicillins  Other (See Comments)    Swelling in joints  . Tramadol     Makes jerk  . Latex Rash    Review of Systems negative except from HPI and PMH  Physical Exam BP 100/70   Pulse  69   Ht 5\' 7"  (1.702 m)   Wt 159 lb (72.1 kg)   SpO2 98%   BMI 24.90 kg/m  Well developed and well nourished in no acute distress HENT normal Neck supple with JVP-flat Clear Device pocket well healed; without hematoma or erythema.  There is no tethering  Regular rate and rhythm, no  murmur Abd-soft with active BS No Clubbing cyanosis   edema Skin-warm and dry A & Oriented  Grossly normal sensory and motor function  ECG s sinus at 63 with P synchronous pacing    Assessment and  Plan  Complete heart block  Pacemaker-Medtronic The patient's device was interrogated.  The information was reviewed. No changes were made in the programming.     Exercise associated hypotension  ? Dysautonomia  Central polyneuropathy  Dyspnea on exertion  Hiatal hernia  Dyspnea on exertion with a negative recent cardiac evaluation including a CTA 2/21 and a echo 7/21 makes me think that this is not likely cardiac.   Ambulatory test in offcie with recurrent dyspnea, modest, with O@ sat >96 and HR < 80  Schedule for hiatal hernia surgery.  I have seen a few cases over the years where this can trigger dysautonomia; she is having issues with orthostatic hypotension.  We will check a BNP today and otherwise consider adding fludrocortisone to her ProAmatine if her BNP is indeed normal  Heart rate excursion and device function is normal

## 2020-02-09 ENCOUNTER — Encounter: Payer: Self-pay | Admitting: Internal Medicine

## 2020-02-09 ENCOUNTER — Ambulatory Visit (INDEPENDENT_AMBULATORY_CARE_PROVIDER_SITE_OTHER): Payer: Medicare Other | Admitting: Internal Medicine

## 2020-02-09 ENCOUNTER — Other Ambulatory Visit: Payer: Self-pay

## 2020-02-09 VITALS — BP 100/70 | HR 69 | Ht 67.0 in | Wt 159.0 lb

## 2020-02-09 DIAGNOSIS — Z950 Presence of cardiac pacemaker: Secondary | ICD-10-CM | POA: Diagnosis not present

## 2020-02-09 DIAGNOSIS — I951 Orthostatic hypotension: Secondary | ICD-10-CM | POA: Diagnosis not present

## 2020-02-09 DIAGNOSIS — R0602 Shortness of breath: Secondary | ICD-10-CM

## 2020-02-09 DIAGNOSIS — I442 Atrioventricular block, complete: Secondary | ICD-10-CM | POA: Diagnosis not present

## 2020-02-09 LAB — CUP PACEART INCLINIC DEVICE CHECK
Battery Remaining Longevity: 33 mo
Battery Voltage: 2.95 V
Brady Statistic AP VP Percent: 11.83 %
Brady Statistic AP VS Percent: 0 %
Brady Statistic AS VP Percent: 88.15 %
Brady Statistic AS VS Percent: 0.02 %
Brady Statistic RA Percent Paced: 11.83 %
Brady Statistic RV Percent Paced: 99.98 %
Date Time Interrogation Session: 20210928144525
Implantable Lead Implant Date: 20140818
Implantable Lead Implant Date: 20140818
Implantable Lead Location: 753859
Implantable Lead Location: 753860
Implantable Lead Model: 5076
Implantable Lead Model: 5076
Implantable Pulse Generator Implant Date: 20140818
Lead Channel Impedance Value: 361 Ohm
Lead Channel Impedance Value: 494 Ohm
Lead Channel Impedance Value: 589 Ohm
Lead Channel Impedance Value: 627 Ohm
Lead Channel Pacing Threshold Amplitude: 0.625 V
Lead Channel Pacing Threshold Amplitude: 1.25 V
Lead Channel Pacing Threshold Pulse Width: 0.4 ms
Lead Channel Pacing Threshold Pulse Width: 0.4 ms
Lead Channel Sensing Intrinsic Amplitude: 1.25 mV
Lead Channel Sensing Intrinsic Amplitude: 2.375 mV
Lead Channel Sensing Intrinsic Amplitude: 20.75 mV
Lead Channel Sensing Intrinsic Amplitude: 20.75 mV
Lead Channel Setting Pacing Amplitude: 2.5 V
Lead Channel Setting Pacing Amplitude: 2.5 V
Lead Channel Setting Pacing Pulse Width: 0.4 ms
Lead Channel Setting Sensing Sensitivity: 0.9 mV

## 2020-02-09 NOTE — Patient Instructions (Signed)
Medication Instructions:  Your physician recommends that you continue on your current medications as directed. Please refer to the Current Medication list given to you today. *If you need a refill on your cardiac medications before your next appointment, please call your pharmacy*   Lab Work: BNP today  If you have labs (blood work) drawn today and your tests are completely normal, you will receive your results only by: Marland Kitchen MyChart Message (if you have MyChart) OR . A paper copy in the mail If you have any lab test that is abnormal or we need to change your treatment, we will call you to review the results.   Testing/Procedures: None ordered.    Follow-Up: At [facility], you and your health needs are our priority.  As part of our continuing mission to provide you with exceptional heart care, we have created designated Provider Care Teams.  These Care Teams include your primary Cardiologist (physician) and Advanced Practice Providers (APPs -  Physician Assistants and Nurse Practitioners) who all work together to provide you with the care you need, when you need it.  We recommend signing up for the patient portal called "MyChart".  Sign up information is provided on this After Visit Summary.  MyChart is used to connect with patients for Virtual Visits (Telemedicine).  Patients are able to view lab/test results, encounter notes, upcoming appointments, etc.  Non-urgent messages can be sent to your provider as well.   To learn more about what you can do with MyChart, go to [email or web address].    Your next appointment:   12 month(s)  The format for your next appointment:   In Person  Provider:   Virl Axe, MD

## 2020-02-10 DIAGNOSIS — Z23 Encounter for immunization: Secondary | ICD-10-CM | POA: Diagnosis not present

## 2020-02-10 LAB — PRO B NATRIURETIC PEPTIDE: NT-Pro BNP: 446 pg/mL (ref 0–738)

## 2020-02-16 ENCOUNTER — Telehealth: Payer: Self-pay

## 2020-02-16 NOTE — Telephone Encounter (Signed)
-----   Message from Deboraha Sprang, MD sent at 02/11/2020  5:34 PM EDT ----- Please Inform Patient that heart failure lab was normal  Lets try the florinef at 0.1 mg    will need BMET in 2 weeks  Thanks

## 2020-02-16 NOTE — Telephone Encounter (Signed)
Spoke with pt and advised of normal lab result.  Pt declines taking Florinef 0.1mg  at this time d/t upcoming surgery.  Pt states she would prefer to increase her Midodrine to 10mg  as discussed with Dr Caryl Comes during [place].  Pt would like new Rx sent to pharmacy.  Pt advised will make Dr Caryl Comes aware of how she would like to proceed.  Pt verbalizes understanding and thanked Therapist, sports for call.

## 2020-02-16 NOTE — Telephone Encounter (Signed)
That is fine with me.

## 2020-02-18 MED ORDER — MIDODRINE HCL 10 MG PO TABS: 10.0000 mg | ORAL_TABLET | Freq: Three times a day (TID) | ORAL | 3 refills | Status: DC

## 2020-02-20 DIAGNOSIS — Z23 Encounter for immunization: Secondary | ICD-10-CM | POA: Diagnosis not present

## 2020-02-23 NOTE — Progress Notes (Signed)
Your procedure is scheduled on Tuesday, March 01, 2020.  Report to [facility] Main Entrance "A" at 9:30 A.M., and check in at the Admitting office.  Call this number if you have problems the morning of surgery:  [phone]  Call [phone] if you have any questions prior to your surgery date Monday-Friday 8am-4pm    Remember:  Do not eat after midnight the night before your surgery  You may drink clear liquids until 8:30 AM the morning of your surgery.   Clear liquids allowed are: Water, Non-Citrus Juices (without pulp), Carbonated Beverages, Clear Tea, Black Coffee Only, and Gatorade    Take these medicines the morning of surgery with A SIP OF WATER:  budesonide (ENTOCORT EC)  cetirizine (ZYRTEC) escitalopram (LEXAPRO) levothyroxine (SYNTHROID) midodrine (PROAMATINE) RABEprazole (ACIPHEX)   IF NEEDED: acetaminophen (TYLENOL) albuterol (PROVENTIL HFA;VENTOLIN HFA)  Artificial Tear Ointment (DRY EYES OP) meclizine (ANTIVERT)  ondansetron (ZOFRAN)   Follow your surgeon's instructions on when to stop Aspirin and Pentosan Polysulfate Sodium (ELMIRON PO) .  If no instructions were given by your surgeon then you will need to call the office to get those instructions.     As of today, STOP taking any Aleve, Naproxen, Ibuprofen, Motrin, Advil, Goody's, BC's, all herbal medications, fish oil, and all vitamins.                      Do not wear jewelry, make up, or nail polish            Do not wear lotions, powders, perfumes, or deodorant.            Do not shave 48 hours prior to surgery.            Do not bring valuables to the hospital.            [facility] is not responsible for any belongings or valuables.  Do NOT Smoke (Tobacco/Vaping) or drink Alcohol 24 hours prior to your procedure If you use a CPAP at night, you may bring all equipment for your overnight stay.   Contacts, glasses, dentures or bridgework may not be worn into surgery.      For patients admitted  to the hospital, discharge time will be determined by your treatment team.   Patients discharged the day of surgery will not be allowed to drive home, and someone needs to stay with them for 24 hours.    Special instructions:   [place]- Preparing For Surgery  Before surgery, you can play an important role. Because skin is not sterile, your skin needs to be as free of germs as possible. You can reduce the number of germs on your skin by washing with CHG (chlorahexidine gluconate) Soap before surgery.  CHG is an antiseptic cleaner which kills germs and bonds with the skin to continue killing germs even after washing.    Oral Hygiene is also important to reduce your risk of infection.  Remember - BRUSH YOUR TEETH THE MORNING OF SURGERY WITH YOUR REGULAR TOOTHPASTE  Please do not use if you have an allergy to CHG or antibacterial soaps. If your skin becomes reddened/irritated stop using the CHG.  Do not shave (including legs and underarms) for at least 48 hours prior to first CHG shower. It is OK to shave your face.  Please follow these instructions carefully.   1. Shower the NIGHT BEFORE SURGERY and the MORNING OF SURGERY with CHG Soap.   2. If you chose to  wash your hair, wash your hair first as usual with your normal shampoo.  3. After you shampoo, rinse your hair and body thoroughly to remove the shampoo.  4. Use CHG as you would any other liquid soap. You can apply CHG directly to the skin and wash gently with a scrungie or a clean washcloth.   5. Apply the CHG Soap to your body ONLY FROM THE NECK DOWN.  Do not use on open wounds or open sores. Avoid contact with your eyes, ears, mouth and genitals (private parts). Wash Face and genitals (private parts)  with your normal soap.   6. Wash thoroughly, paying special attention to the area where your surgery will be performed.  7. Thoroughly rinse your body with warm water from the neck down.  8. DO NOT shower/wash with your normal  soap after using and rinsing off the CHG Soap.  9. Pat yourself dry with a CLEAN TOWEL.  10. Wear CLEAN PAJAMAS to bed the night before surgery  11. Place CLEAN SHEETS on your bed the night of your first shower and DO NOT SLEEP WITH PETS.   Day of Surgery: Wear Clean/Comfortable clothing the morning of surgery Do not apply any deodorants/lotions.   Remember to brush your teeth WITH YOUR REGULAR TOOTHPASTE.   Please read over the following fact sheets that you were given.

## 2020-02-24 ENCOUNTER — Encounter (HOSPITAL_COMMUNITY)
Admission: RE | Admit: 2020-02-24 | Discharge: 2020-02-24 | Disposition: A | Discharge status: Home / Self Care | Payer: Medicare Other | Source: Ambulatory Visit | Attending: General Surgery | Admitting: General Surgery

## 2020-02-24 ENCOUNTER — Encounter: Payer: Self-pay | Admitting: Internal Medicine

## 2020-02-24 ENCOUNTER — Encounter (HOSPITAL_COMMUNITY): Payer: Self-pay

## 2020-02-24 ENCOUNTER — Other Ambulatory Visit: Payer: Self-pay

## 2020-02-24 DIAGNOSIS — Z01812 Encounter for preprocedural laboratory examination: Secondary | ICD-10-CM | POA: Diagnosis not present

## 2020-02-24 LAB — COMPREHENSIVE METABOLIC PANEL
ALT: 24 U/L (ref 0–44)
AST: 23 U/L (ref 15–41)
Albumin: 4.2 g/dL (ref 3.5–5.0)
Alkaline Phosphatase: 45 U/L (ref 38–126)
Anion gap: 9 (ref 5–15)
BUN: 11 mg/dL (ref 8–23)
CO2: 28 mmol/L (ref 22–32)
Calcium: 9.9 mg/dL (ref 8.9–10.3)
Chloride: 103 mmol/L (ref 98–111)
Creatinine, Ser: 0.89 mg/dL (ref 0.44–1.00)
GFR, Estimated: >60 mL/min (ref >60)
Glucose, Bld: 86 mg/dL (ref 70–99)
Potassium: 3.9 mmol/L (ref 3.5–5.1)
Sodium: 140 mmol/L (ref 135–145)
Total Bilirubin: 0.9 mg/dL (ref 0.3–1.2)
Total Protein: 6.7 g/dL (ref 6.5–8.1)

## 2020-02-24 LAB — CBC
HCT: 43.1 % (ref 36.0–46.0)
Hemoglobin: 14 g/dL (ref 12.0–15.0)
MCH: 30.7 pg (ref 26.0–34.0)
MCHC: 32.5 g/dL (ref 30.0–36.0)
MCV: 94.5 fL (ref 80.0–100.0)
Platelets: 269 10*3/uL (ref 150–400)
RBC: 4.56 MIL/uL (ref 3.87–5.11)
RDW: 13.1 % (ref 11.5–15.5)
WBC: 6.1 10*3/uL (ref 4.0–10.5)
nRBC: 0 % (ref 0.0–0.2)

## 2020-02-24 NOTE — Progress Notes (Unsigned)
[facility] DEVICE PROGRAMMING  Patient Information: Name:  [name]  DOB:  07-Feb-1946  MRN:  [id number]  {TIP - You do not have to delete this tip  -  Copy the info from the staff message sent by the PAT staff  then press F2 here and paste the information using CTL - V on the next line :[id number]}  Planned Procedure: Robotic hiatal hernia repair with mesh and fundoplication  Surgeon: Dr. Ralene Ok  Date of Procedure: 03/01/20  Position during surgery: Supine   Please send documentation back to:  [facility] (Fax # [phone])   Device Information:  Clinic EP Physician:  Virl Axe, MD   Device Type:  Pacemaker Manufacturer and Phone #:  Medtronic: [id number] Pacemaker Dependent?:  Yes.   Date of Last Device Check:  02/09/20 Normal Device Function?:  Yes.    Electrophysiologist's Recommendations:   Have magnet available.  Provide continuous ECG monitoring when magnet is used or reprogramming is to be performed.   Procedure may interfere with device function.  Magnet should be placed over device during procedure.  Per Device Clinic Standing Orders, Simone Curia, RN  10:00 AM 02/24/2020

## 2020-02-24 NOTE — Progress Notes (Addendum)
PCP:  Burnard Bunting, MD Cardiologist:  Virl Axe, MD  EKG:  02/09/20 CXR:  N/A ECHO:  12/10/19 Stress Test:  Denies Cardiac Cath:  Denies  Covid test 02/26/20  Anesthesia Review:  Yes, PPM (Medtronic).  Device orders received and in Epic.     Patient denies shortness of breath, fever, cough, and chest pain at PAT appointment.  Patient verbalized understanding of instructions provided today at the PAT appointment.  Patient asked to review instructions at home and day of surgery.

## 2020-02-25 NOTE — Progress Notes (Addendum)
Anesthesia Chart Review:  Follows with EP cardiology for hx of CHB s/p medtronic PPM, autonomic dysfunction. She recently reported DOE and had negative cardiac workup including CTA 2/21 and echo 7/21. She had ambulatory test in office with modest DOE, no drop in O2 sats. Last seen by Dr. Caryl Comes 02/09/20. Discussed upcoming surgery. Per note, "Schedule for hiatal hernia surgery.  I have seen a few cases over the years where this can trigger dysautonomia; she is having issues with orthostatic hypotension."  Hx of C4-5 cervical fusion.   Preop labs reviewed, WNL.  EKG 02/09/20 (read per note same date): sinus at 63 with P synchronous pacing   Periop device orders 02/24/20: Device Information: Clinic EP Physician:  Virl Axe, MD   Device Type:  Pacemaker Manufacturer and Phone #:  Medtronic: [phone] Pacemaker Dependent?:  Yes.   Date of Last Device Check:  02/09/20           Normal Device Function?:  Yes.    Electrophysiologist's Recommendations:   Have magnet available.  Provide continuous ECG monitoring when magnet is used or reprogramming is to be performed.   Procedure may interfere with device function.  Magnet should be placed over device during procedure.   TTE 12/10/19: 1. Abnormal septal motion from pacing Abnormal GLS -11. Left ventricular  ejection fraction, by estimation, is 50 to 55%. The left ventricle has low  normal function. The left ventricle has no regional wall motion  abnormalities. Left ventricular diastolic  parameters are indeterminate.  2. Pacing wires seen in RA/RV. Right ventricular systolic function is  normal. The right ventricular size is normal. There is normal pulmonary  artery systolic pressure.  3. The mitral valve is normal in structure. Trivial mitral valve  regurgitation. No evidence of mitral stenosis.  4. The aortic valve is tricuspid. Aortic valve regurgitation is not  visualized. Mild aortic valve sclerosis is present, with no  evidence of  aortic valve stenosis.  5. The inferior vena cava is normal in size with greater than 50%  respiratory variability, suggesting right atrial pressure of 3 mmHg.   CT coronary morphology  06/26/19: IMPRESSION: 1. Coronary calcium score of 2. This was 51 percentile for age and sex matched control.  2. Normal coronary origin with right dominance.  3. CAD-RADS 1. Minimal non-obstructive CAD (0-24%). Consider non-atherosclerotic causes of chest pain. Consider preventive therapy and risk factor modification.  4.  Dual chamber PM is in place.   [name] [facility] Short Stay Center/Anesthesiology Phone [phone] 02/25/2020 2:09 PM

## 2020-02-25 NOTE — Anesthesia Preprocedure Evaluation (Addendum)
Anesthesia Evaluation  Patient identified by MRN, date of birth, ID band Patient awake    Reviewed: Allergy & Precautions, NPO status   History of Anesthesia Complications (+) PONV  Airway Mallampati: II  TM Distance: >3 FB     Dental   Pulmonary shortness of breath, asthma , sleep apnea , Recent URI ,    breath sounds clear to auscultation       Cardiovascular + dysrhythmias + pacemaker  Rhythm:Regular Rate:Normal  Heart history noted. No SOB or CP noted. CG   Neuro/Psych  Headaches, PSYCHIATRIC DISORDERS Depression  Neuromuscular disease CVA    GI/Hepatic Neg liver ROS, hiatal hernia, GERD  ,  Endo/Other  Hypothyroidism   Renal/GU negative Renal ROS     Musculoskeletal   Abdominal   Peds  Hematology   Anesthesia Other Findings   Reproductive/Obstetrics                            Anesthesia Physical Anesthesia Plan  ASA: III  Anesthesia Plan: General   Post-op Pain Management:    Induction: Intravenous  PONV Risk Score and Plan: 4 or greater and Ondansetron, Dexamethasone and Midazolam  Airway Management Planned: Oral ETT  Additional Equipment:   Intra-op Plan:   Post-operative Plan: Extubation in OR  Informed Consent: I have reviewed the patients History and Physical, chart, labs and discussed the procedure including the risks, benefits and alternatives for the proposed anesthesia with the patient or authorized representative who has indicated his/her understanding and acceptance.     Dental advisory given  Plan Discussed with:   Anesthesia Plan Comments: (PAT note by Karoline Caldwell, PA-C: Follows with EP cardiology for hx of CHB s/p medtronic PPM, autonomic dysfunction. She recently reported DOE and had negative cardiac workup including CTA 2/21 and echo 7/21. She had ambulatory test in office with modest DOE, no drop in O2 sats. Last seen by Dr. Caryl Comes 02/09/20. Discussed  upcoming surgery. Per note, "Schedule for hiatal hernia surgery.  I have seen a few cases over the years where this can trigger dysautonomia; she is having issues with orthostatic hypotension."  Hx of C4-5 cervical fusion.   Preop labs reviewed, WNL.  EKG 02/09/20 (read per note same date): sinus at 63 with P synchronous pacing   Periop device orders 02/24/20: Device Information: Clinic EP Physician:  Virl Axe, MD   Device Type:  Pacemaker Manufacturer and Phone #:  Medtronic: [id number] Pacemaker Dependent?:  Yes.   Date of Last Device Check:  02/09/20           Normal Device Function?:  Yes.    Electrophysiologist's Recommendations:   Have magnet available.  Provide continuous ECG monitoring when magnet is used or reprogramming is to be performed.   Procedure may interfere with device function.  Magnet should be placed over device during procedure.  TTE 12/10/19: 1. Abnormal septal motion from pacing Abnormal GLS -11. Left ventricular  ejection fraction, by estimation, is 50 to 55%. The left ventricle has low  normal function. The left ventricle has no regional wall motion  abnormalities. Left ventricular diastolic  parameters are indeterminate.  2. Pacing wires seen in RA/RV. Right ventricular systolic function is  normal. The right ventricular size is normal. There is normal pulmonary  artery systolic pressure.  3. The mitral valve is normal in structure. Trivial mitral valve  regurgitation. No evidence of mitral stenosis.  4. The aortic valve is tricuspid. Aortic valve  regurgitation is not  visualized. Mild aortic valve sclerosis is present, with no evidence of  aortic valve stenosis.  5. The inferior vena cava is normal in size with greater than 50%  respiratory variability, suggesting right atrial pressure of 3 mmHg.   CT coronary morphology  06/26/19: IMPRESSION: 1. Coronary calcium score of 2. This was 46 percentile for age and sex matched  control.  2. Normal coronary origin with right dominance.  3. CAD-RADS 1. Minimal non-obstructive CAD (0-24%). Consider non-atherosclerotic causes of chest pain. Consider preventive therapy and risk factor modification.  4.  Dual chamber PM is in place.  )       Anesthesia Quick Evaluation

## 2020-02-26 ENCOUNTER — Other Ambulatory Visit (HOSPITAL_COMMUNITY)
Admission: RE | Admit: 2020-02-26 | Discharge: 2020-02-26 | Disposition: A | Discharge status: Home / Self Care | Payer: Medicare Other | Source: Ambulatory Visit | Attending: General Surgery | Admitting: General Surgery

## 2020-02-26 DIAGNOSIS — Z20822 Contact with and (suspected) exposure to covid-19: Secondary | ICD-10-CM | POA: Insufficient documentation

## 2020-02-26 DIAGNOSIS — Z01812 Encounter for preprocedural laboratory examination: Secondary | ICD-10-CM | POA: Diagnosis not present

## 2020-02-27 LAB — SARS CORONAVIRUS 2 (TAT 6-24 HRS): SARS Coronavirus 2: NEGATIVE

## 2020-03-01 ENCOUNTER — Ambulatory Visit (HOSPITAL_COMMUNITY): Payer: Medicare Other

## 2020-03-01 ENCOUNTER — Encounter (HOSPITAL_COMMUNITY)
Admission: RE | Disposition: A | Discharge status: Home / Self Care | Payer: Self-pay | Source: Home / Self Care | Attending: General Surgery

## 2020-03-01 ENCOUNTER — Ambulatory Visit (HOSPITAL_COMMUNITY): Payer: Medicare Other | Admitting: Physician Assistant

## 2020-03-01 ENCOUNTER — Observation Stay (HOSPITAL_COMMUNITY)
Admission: RE | Admit: 2020-03-01 | Discharge: 2020-03-02 | Disposition: A | Discharge status: Home / Self Care | Payer: Medicare Other | Attending: General Surgery | Admitting: General Surgery

## 2020-03-01 ENCOUNTER — Other Ambulatory Visit: Payer: Self-pay

## 2020-03-01 ENCOUNTER — Encounter (HOSPITAL_COMMUNITY): Payer: Self-pay | Admitting: General Surgery

## 2020-03-01 DIAGNOSIS — J45909 Unspecified asthma, uncomplicated: Secondary | ICD-10-CM | POA: Insufficient documentation

## 2020-03-01 DIAGNOSIS — R0781 Pleurodynia: Secondary | ICD-10-CM

## 2020-03-01 DIAGNOSIS — Z79899 Other long term (current) drug therapy: Secondary | ICD-10-CM | POA: Insufficient documentation

## 2020-03-01 DIAGNOSIS — Z950 Presence of cardiac pacemaker: Secondary | ICD-10-CM | POA: Diagnosis not present

## 2020-03-01 DIAGNOSIS — Z9889 Other specified postprocedural states: Secondary | ICD-10-CM | POA: Diagnosis not present

## 2020-03-01 DIAGNOSIS — E039 Hypothyroidism, unspecified: Secondary | ICD-10-CM | POA: Diagnosis not present

## 2020-03-01 DIAGNOSIS — Z853 Personal history of malignant neoplasm of breast: Secondary | ICD-10-CM | POA: Insufficient documentation

## 2020-03-01 DIAGNOSIS — K2100 Gastro-esophageal reflux disease with esophagitis, without bleeding: Secondary | ICD-10-CM | POA: Insufficient documentation

## 2020-03-01 DIAGNOSIS — Z91040 Latex allergy status: Secondary | ICD-10-CM | POA: Insufficient documentation

## 2020-03-01 DIAGNOSIS — K449 Diaphragmatic hernia without obstruction or gangrene: Principal | ICD-10-CM | POA: Insufficient documentation

## 2020-03-01 DIAGNOSIS — Z7982 Long term (current) use of aspirin: Secondary | ICD-10-CM | POA: Insufficient documentation

## 2020-03-01 DIAGNOSIS — E785 Hyperlipidemia, unspecified: Secondary | ICD-10-CM | POA: Diagnosis not present

## 2020-03-01 DIAGNOSIS — R1013 Epigastric pain: Secondary | ICD-10-CM | POA: Diagnosis present

## 2020-03-01 DIAGNOSIS — I313 Pericardial effusion (noninflammatory): Secondary | ICD-10-CM

## 2020-03-01 LAB — TROPONIN I (HIGH SENSITIVITY)
Troponin I (High Sensitivity): 9 ng/L (ref <18)
Troponin I (High Sensitivity): 13 ng/L (ref <18)

## 2020-03-01 SURGERY — FUNDOPLICATION, NISSEN, ROBOT-ASSISTED, LAPAROSCOPIC
Anesthesia: General | Site: Abdomen

## 2020-03-01 MED ORDER — KETOROLAC TROMETHAMINE 30 MG/ML IJ SOLN
INTRAMUSCULAR | Status: AC
  Filled 2020-03-01: qty 1

## 2020-03-01 MED ORDER — MORPHINE SULFATE (PF) 2 MG/ML IV SOLN
2.0000 mg | INTRAVENOUS | Status: DC | PRN
  Administered 2020-03-01 – 2020-03-02 (×4): 2 mg via INTRAVENOUS
  Filled 2020-03-01 (×4): qty 1

## 2020-03-01 MED ORDER — NITROGLYCERIN 0.4 MG SL SUBL
0.4000 mg | SUBLINGUAL_TABLET | Freq: Once | SUBLINGUAL | Status: AC
  Administered 2020-03-01: 0.4 mg via SUBLINGUAL

## 2020-03-01 MED ORDER — HYDRALAZINE HCL 20 MG/ML IJ SOLN: 10.0000 mg | INTRAMUSCULAR | Status: DC | PRN

## 2020-03-01 MED ORDER — CHLORHEXIDINE GLUCONATE CLOTH 2 % EX PADS: 6.0000 | MEDICATED_PAD | Freq: Once | CUTANEOUS | Status: DC

## 2020-03-01 MED ORDER — KETOROLAC TROMETHAMINE 30 MG/ML IJ SOLN
30.0000 mg | Freq: Four times a day (QID) | INTRAMUSCULAR | Status: DC
Start: 2020-03-01 — End: 2020-03-01
  Administered 2020-03-01: 30 mg via INTRAVENOUS

## 2020-03-01 MED ORDER — ONDANSETRON HCL 4 MG/2ML IJ SOLN
INTRAMUSCULAR | Status: DC | PRN
  Administered 2020-03-01: 4 mg via INTRAVENOUS

## 2020-03-01 MED ORDER — CHLORHEXIDINE GLUCONATE 0.12 % MT SOLN
15.0000 mL | Freq: Once | OROMUCOSAL | Status: AC
  Administered 2020-03-01: 15 mL via OROMUCOSAL
  Filled 2020-03-01: qty 15

## 2020-03-01 MED ORDER — ACETAMINOPHEN 500 MG PO TABS
1000.0000 mg | ORAL_TABLET | ORAL | Status: AC
  Administered 2020-03-01: 1000 mg via ORAL
  Filled 2020-03-01: qty 2

## 2020-03-01 MED ORDER — ONDANSETRON 4 MG PO TBDP: 4.0000 mg | ORAL_TABLET | Freq: Four times a day (QID) | ORAL | Status: DC | PRN

## 2020-03-01 MED ORDER — SCOPOLAMINE 1 MG/3DAYS TD PT72
MEDICATED_PATCH | TRANSDERMAL | Status: DC | PRN
  Administered 2020-03-01: 1 via TRANSDERMAL

## 2020-03-01 MED ORDER — 0.9 % SODIUM CHLORIDE (POUR BTL) OPTIME
TOPICAL | Status: DC | PRN
  Administered 2020-03-01: 1000 mL

## 2020-03-01 MED ORDER — EPHEDRINE 5 MG/ML INJ
INTRAVENOUS | Status: AC
  Filled 2020-03-01: qty 20

## 2020-03-01 MED ORDER — FENTANYL CITRATE (PF) 100 MCG/2ML IJ SOLN: 25.0000 ug | INTRAMUSCULAR | Status: DC | PRN

## 2020-03-01 MED ORDER — ONDANSETRON HCL 4 MG/2ML IJ SOLN
INTRAMUSCULAR | Status: AC
  Filled 2020-03-01: qty 4

## 2020-03-01 MED ORDER — FENTANYL CITRATE (PF) 250 MCG/5ML IJ SOLN
INTRAMUSCULAR | Status: DC | PRN
Start: 2020-03-01 — End: 2020-03-01
  Administered 2020-03-01: 50 ug via INTRAVENOUS
  Administered 2020-03-01: 25 ug via INTRAVENOUS
  Administered 2020-03-01: 100 ug via INTRAVENOUS

## 2020-03-01 MED ORDER — AMISULPRIDE (ANTIEMETIC) 5 MG/2ML IV SOLN
INTRAVENOUS | Status: AC
  Filled 2020-03-01: qty 4

## 2020-03-01 MED ORDER — FENTANYL CITRATE (PF) 250 MCG/5ML IJ SOLN
INTRAMUSCULAR | Status: AC
  Filled 2020-03-01: qty 5

## 2020-03-01 MED ORDER — ROCURONIUM BROMIDE 10 MG/ML (PF) SYRINGE
PREFILLED_SYRINGE | INTRAVENOUS | Status: DC | PRN
  Administered 2020-03-01: 30 mg via INTRAVENOUS
  Administered 2020-03-01 (×2): 10 mg via INTRAVENOUS

## 2020-03-01 MED ORDER — PHENYLEPHRINE 40 MCG/ML (10ML) SYRINGE FOR IV PUSH (FOR BLOOD PRESSURE SUPPORT)
PREFILLED_SYRINGE | INTRAVENOUS | Status: AC
  Filled 2020-03-01: qty 20

## 2020-03-01 MED ORDER — PROPOFOL 10 MG/ML IV BOLUS
INTRAVENOUS | Status: DC | PRN
  Administered 2020-03-01: 150 mg via INTRAVENOUS
  Administered 2020-03-01: 50 mg via INTRAVENOUS

## 2020-03-01 MED ORDER — ROCURONIUM BROMIDE 10 MG/ML (PF) SYRINGE
PREFILLED_SYRINGE | INTRAVENOUS | Status: AC
  Filled 2020-03-01: qty 10

## 2020-03-01 MED ORDER — ORAL CARE MOUTH RINSE: 15.0000 mL | Freq: Once | OROMUCOSAL | Status: AC

## 2020-03-01 MED ORDER — EPINEPHRINE 1 MG/10ML IJ SOSY
PREFILLED_SYRINGE | INTRAMUSCULAR | Status: AC
  Filled 2020-03-01: qty 10

## 2020-03-01 MED ORDER — DEXAMETHASONE SODIUM PHOSPHATE 10 MG/ML IJ SOLN
INTRAMUSCULAR | Status: AC
  Filled 2020-03-01: qty 3

## 2020-03-01 MED ORDER — EPHEDRINE 5 MG/ML INJ
INTRAVENOUS | Status: AC
  Filled 2020-03-01: qty 10

## 2020-03-01 MED ORDER — LIDOCAINE 2% (20 MG/ML) 5 ML SYRINGE
INTRAMUSCULAR | Status: AC
  Filled 2020-03-01: qty 5

## 2020-03-01 MED ORDER — PROPOFOL 10 MG/ML IV BOLUS
INTRAVENOUS | Status: AC
  Filled 2020-03-01: qty 20

## 2020-03-01 MED ORDER — LACTATED RINGERS IV SOLN: INTRAVENOUS | Status: DC

## 2020-03-01 MED ORDER — LEVOTHYROXINE SODIUM 100 MCG/5ML IV SOLN: 44.0000 ug | Freq: Every day | INTRAVENOUS | Status: DC

## 2020-03-01 MED ORDER — SODIUM CHLORIDE 0.9 % IR SOLN
Status: DC | PRN
  Administered 2020-03-01: 1000 mL

## 2020-03-01 MED ORDER — VANCOMYCIN HCL IN DEXTROSE 1-5 GM/200ML-% IV SOLN
1000.0000 mg | INTRAVENOUS | Status: AC
  Administered 2020-03-01: 1000 mg via INTRAVENOUS
  Filled 2020-03-01: qty 200

## 2020-03-01 MED ORDER — NITROGLYCERIN 0.4 MG SL SUBL
SUBLINGUAL_TABLET | SUBLINGUAL | Status: AC
  Filled 2020-03-01: qty 1

## 2020-03-01 MED ORDER — AMISULPRIDE (ANTIEMETIC) 5 MG/2ML IV SOLN
10.0000 mg | Freq: Once | INTRAVENOUS | Status: AC
  Administered 2020-03-01: 10 mg via INTRAVENOUS

## 2020-03-01 MED ORDER — DEXAMETHASONE SODIUM PHOSPHATE 10 MG/ML IJ SOLN
INTRAMUSCULAR | Status: DC | PRN
  Administered 2020-03-01: 5 mg via INTRAVENOUS

## 2020-03-01 MED ORDER — ROCURONIUM BROMIDE 10 MG/ML (PF) SYRINGE
PREFILLED_SYRINGE | INTRAVENOUS | Status: AC
  Filled 2020-03-01: qty 20

## 2020-03-01 MED ORDER — SUCCINYLCHOLINE CHLORIDE 200 MG/10ML IV SOSY
PREFILLED_SYRINGE | INTRAVENOUS | Status: DC | PRN
  Administered 2020-03-01: 100 mg via INTRAVENOUS

## 2020-03-01 MED ORDER — SUCCINYLCHOLINE CHLORIDE 200 MG/10ML IV SOSY
PREFILLED_SYRINGE | INTRAVENOUS | Status: AC
  Filled 2020-03-01: qty 10

## 2020-03-01 MED ORDER — ALBUTEROL SULFATE HFA 108 (90 BASE) MCG/ACT IN AERS
2.0000 | INHALATION_SPRAY | Freq: Four times a day (QID) | RESPIRATORY_TRACT | Status: DC | PRN
  Filled 2020-03-01: qty 6.7

## 2020-03-01 MED ORDER — ENOXAPARIN SODIUM 40 MG/0.4ML ~~LOC~~ SOLN
40.0000 mg | SUBCUTANEOUS | Status: DC
  Administered 2020-03-02: 40 mg via SUBCUTANEOUS
  Filled 2020-03-01 (×2): qty 0.4

## 2020-03-01 MED ORDER — ONDANSETRON HCL 4 MG/2ML IJ SOLN
4.0000 mg | Freq: Four times a day (QID) | INTRAMUSCULAR | Status: DC | PRN
  Administered 2020-03-01 – 2020-03-02 (×2): 4 mg via INTRAVENOUS
  Filled 2020-03-01 (×2): qty 2

## 2020-03-01 MED ORDER — SUGAMMADEX SODIUM 200 MG/2ML IV SOLN
INTRAVENOUS | Status: DC | PRN
  Administered 2020-03-01: 200 mg via INTRAVENOUS

## 2020-03-01 MED ORDER — DEXTROSE-NACL 5-0.9 % IV SOLN: INTRAVENOUS | Status: DC

## 2020-03-01 MED ORDER — ENSURE PRE-SURGERY PO LIQD: 296.0000 mL | Freq: Once | ORAL | Status: DC

## 2020-03-01 SURGICAL SUPPLY — 53 items
APPLIER CLIP 5 13 M/L LIGAMAX5 (MISCELLANEOUS)
CHLORAPREP W/TINT 26 (MISCELLANEOUS) ×2 IMPLANT
CLIP APPLIE 5 13 M/L LIGAMAX5 (MISCELLANEOUS) IMPLANT
COVER MAYO STAND STRL (DRAPES) ×2 IMPLANT
COVER SURGICAL LIGHT HANDLE (MISCELLANEOUS) ×2 IMPLANT
COVER TIP SHEARS 8 DVNC (MISCELLANEOUS) ×1 IMPLANT
COVER TIP SHEARS 8MM DA VINCI (MISCELLANEOUS) ×2
DECANTER SPIKE VIAL GLASS SM (MISCELLANEOUS) ×2 IMPLANT
DEFOGGER SCOPE WARMER CLEARIFY (MISCELLANEOUS) ×2 IMPLANT
DERMABOND ADVANCED (GAUZE/BANDAGES/DRESSINGS) ×1
DERMABOND ADVANCED .7 DNX12 (GAUZE/BANDAGES/DRESSINGS) ×1 IMPLANT
DRAIN PENROSE 0.5X18 (DRAIN) IMPLANT
DRAPE ARM DVNC X/XI (DISPOSABLE) ×4 IMPLANT
DRAPE CARDIOVASC SPLIT 88X140 (DRAPES) ×2 IMPLANT
DRAPE COLUMN DVNC XI (DISPOSABLE) ×1 IMPLANT
DRAPE DA VINCI XI ARM (DISPOSABLE) ×8
DRAPE DA VINCI XI COLUMN (DISPOSABLE) ×2
DRAPE ORTHO SPLIT 77X108 STRL (DRAPES) ×2
DRAPE SURG ORHT 6 SPLT 77X108 (DRAPES) ×1 IMPLANT
ELECT REM PT RETURN 9FT ADLT (ELECTROSURGICAL) ×2
ELECTRODE REM PT RTRN 9FT ADLT (ELECTROSURGICAL) ×1 IMPLANT
GLOVE BIO SURGEON STRL SZ7.5 (GLOVE) ×4 IMPLANT
GOWN STRL REUS W/ TWL XL LVL3 (GOWN DISPOSABLE) ×2 IMPLANT
GOWN STRL REUS W/TWL 2XL LVL3 (GOWN DISPOSABLE) ×2 IMPLANT
GOWN STRL REUS W/TWL XL LVL3 (GOWN DISPOSABLE) ×4
GRASPER SUT TROCAR 14GX15 (MISCELLANEOUS) IMPLANT
KIT BASIN OR (CUSTOM PROCEDURE TRAY) ×2 IMPLANT
KIT TURNOVER KIT B (KITS) IMPLANT
MARKER SKIN DUAL TIP RULER LAB (MISCELLANEOUS) ×2 IMPLANT
MESH BIO-A 7X10 SYN MAT (Mesh General) ×2 IMPLANT
NEEDLE INSUFFLATION 14GA 120MM (NEEDLE) ×2 IMPLANT
OBTURATOR OPTICAL STANDARD 8MM (TROCAR)
OBTURATOR OPTICAL STND 8 DVNC (TROCAR)
OBTURATOR OPTICALSTD 8 DVNC (TROCAR) IMPLANT
PENCIL SMOKE EVACUATOR (MISCELLANEOUS) IMPLANT
SCISSORS LAP 5X35 DISP (ENDOMECHANICALS) IMPLANT
SEAL CANN UNIV 5-8 DVNC XI (MISCELLANEOUS) ×4 IMPLANT
SEAL XI 5MM-8MM UNIVERSAL (MISCELLANEOUS) ×8
SEALER VESSEL DA VINCI XI (MISCELLANEOUS) ×2
SEALER VESSEL EXT DVNC XI (MISCELLANEOUS) ×1 IMPLANT
SET IRRIG TUBING LAPAROSCOPIC (IRRIGATION / IRRIGATOR) ×2 IMPLANT
SET TUBE SMOKE EVAC HIGH FLOW (TUBING) ×2 IMPLANT
SLEEVE ENDOPATH XCEL 5M (ENDOMECHANICALS) IMPLANT
STAPLER VISISTAT 35W (STAPLE) IMPLANT
STOPCOCK 4 WAY LG BORE MALE ST (IV SETS) ×2 IMPLANT
SUT ETHIBOND 0 36 GRN (SUTURE) ×4 IMPLANT
SUT ETHIBOND 2 0 SH (SUTURE)
SUT ETHIBOND 2 0 SH 36X2 (SUTURE) IMPLANT
SUT MNCRL AB 4-0 PS2 18 (SUTURE) ×2 IMPLANT
SUT SILK 0 SH 30 (SUTURE) ×6 IMPLANT
TRAY FOLEY MTR SLVR 16FR STAT (SET/KITS/TRAYS/PACK) ×2 IMPLANT
TRAY LAPAROSCOPIC MC (CUSTOM PROCEDURE TRAY) ×2 IMPLANT
TROCAR ADV FIXATION 5X100MM (TROCAR) ×2 IMPLANT

## 2020-03-01 NOTE — Discharge Instructions (Signed)
EATING AFTER YOUR ESOPHAGEAL SURGERY (Stomach Fundoplication, Hiatal Hernia repair, Achalasia surgery, etc)  ######################################################################  EAT Start with a pureed / full liquid diet (see below) Gradually transition to a high fiber diet with a fiber supplement over the next month after discharge.    WALK Walk an hour a day.  Control your pain to do that.    CONTROL PAIN Control pain so that you can walk, sleep, tolerate sneezing/coughing, go up/down stairs.  HAVE A BOWEL MOVEMENT DAILY Keep your bowels regular to avoid problems.  OK to try a laxative to override constipation.  OK to use an antidairrheal to slow down diarrhea.  Call if not better after 2 tries  CALL IF YOU HAVE PROBLEMS/CONCERNS Call if you are still struggling despite following these instructions. Call if you have concerns not answered by these instructions  ######################################################################   After your esophageal surgery, expect some sticking with swallowing over the next 1-2 months.    If food sticks when you eat, it is called "dysphagia".  This is due to swelling around your esophagus at the wrap & hiatal diaphragm repair.  It will gradually ease off over the next few months.  To help you through this temporary phase, we start you out on a pureed (blenderized) diet.  Your first meal in the hospital was thin liquids.  You should have been given a pureed diet by the time you left the hospital.  We ask patients to stay on a pureed diet for the first 2-3 weeks to avoid anything getting "stuck" near your recent surgery.  Don't be alarmed if your ability to swallow doesn't progress according to this plan.  Everyone is different and some diets can advance more or less quickly.    It is often helpful to crush your medications or split them as they can sometimes stick, especially the first week or so.   Some BASIC RULES to follow  are:  Maintain an upright position whenever eating or drinking.  Take small bites - just a teaspoon size bite at a time.  Eat slowly.  It may also help to eat only one food at a time.  Consider nibbling through smaller, more frequent meals & avoid the urge to eat BIG meals  Do not push through feelings of fullness, nausea, or bloatedness  Do not mix solid foods and liquids in the same mouthful  Try not to "wash foods down" with large gulps of liquids.  Avoid carbonated (bubbly/fizzy) drinks.    Avoid foods that make you feel gassy or bloated.  Start with bland foods first.  Wait on trying greasy, fried, or spicy meals until you are tolerating more bland solids well.  Understand that it will be hard to burp and belch at first.  This gradually improves with time.  Expect to be more gassy/flatulent/bloated initially.  Walking will help your body manage it better.  Consider using medications for bloating that contain simethicone such as  Maalox or Gas-X   Consider crushing her medications, especially smaller pills.  The ability to swallow pills should get easier after a few weeks  Eat in a relaxed atmosphere & minimize distractions.  Avoid talking while eating.    Do not use straws.  Following each meal, sit in an upright position (90 degree angle) for 60 to 90 minutes.  Going for a short walk can help as well  If food does stick, don't panic.  Try to relax and let the food pass on its own.    Sipping WARM LIQUID such as strong hot black tea can also help slide it down.   Be gradual in changes & use common sense:  -If you easily tolerating a certain "level" of foods, advance to the next level gradually -If you are having trouble swallowing a particular food, then avoid it.   -If food is sticking when you advance your diet, go back to thinner previous diet (the lower LEVEL) for 1-2 days.  LEVEL 1 = PUREED DIET  Do for the first 2 WEEKS AFTER SURGERY  -Foods in this group are  pureed or blenderized to a smooth, mashed potato-like consistency.  -If necessary, the pureed foods can keep their shape with the addition of a thickening agent.   -Meat should be pureed to a smooth, pasty consistency.  Hot broth or gravy may be added to the pureed meat, approximately 1 oz. of liquid per 3 oz. serving of meat. -CAUTION:  If any foods do not puree into a smooth consistency, swallowing will be more difficult.  (For example, nuts or seeds sometimes do not blend well.)  Hot Foods Cold Foods  Pureed scrambled eggs and cheese Pureed cottage cheese  Baby cereals Thickened juices and nectars  Thinned cooked cereals (no lumps) Thickened milk or eggnog  Pureed French toast or pancakes Ensure  Mashed potatoes Ice cream  Pureed parsley, au gratin, scalloped potatoes, candied sweet potatoes Fruit or Italian ice, sherbet  Pureed buttered or alfredo noodles Plain yogurt  Pureed vegetables (no corn or peas) Instant breakfast  Pureed soups and creamed soups Smooth pudding, mousse, custard  Pureed scalloped apples Whipped gelatin  Gravies Sugar, syrup, honey, jelly  Sauces, cheese, tomato, barbecue, white, creamed Cream  Any baby food Creamer  Alcohol in moderation (not beer or champagne) Margarine  Coffee or tea Mayonnaise   Ketchup, mustard   Apple sauce   SAMPLE MENU:  PUREED DIET Breakfast Lunch Dinner   Orange juice, 1/2 cup  Cream of wheat, 1/2 cup  Pineapple juice, 1/2 cup  Pureed turkey, barley soup, 3/4 cup  Pureed Hawaiian chicken, 3 oz   Scrambled eggs, mashed or blended with cheese, 1/2 cup  Tea or coffee, 1 cup   Whole milk, 1 cup   Non-dairy creamer, 2 Tbsp.  Mashed potatoes, 1/2 cup  Pureed cooled broccoli, 1/2 cup  Apple sauce, 1/2 cup  Coffee or tea  Mashed potatoes, 1/2 cup  Pureed spinach, 1/2 cup  Frozen yogurt, 1/2 cup  Tea or coffee      LEVEL 2 = SOFT DIET  After your first 2 weeks, you can advance to a soft diet.   Keep on this  diet until everything goes down easily.  Hot Foods Cold Foods  White fish Cottage cheese  Stuffed fish Junior baby fruit  Baby food meals Semi thickened juices  Minced soft cooked, scrambled, poached eggs nectars  Souffle & omelets Ripe mashed bananas  Cooked cereals Canned fruit, pineapple sauce, milk  potatoes Milkshake  Buttered or Alfredo noodles Custard  Cooked cooled vegetable Puddings, including tapioca  Sherbet Yogurt  Vegetable soup or alphabet soup Fruit ice, Italian ice  Gravies Whipped gelatin  Sugar, syrup, honey, jelly Junior baby desserts  Sauces:  Cheese, creamed, barbecue, tomato, white Cream  Coffee or tea Margarine   SAMPLE MENU:  LEVEL 2 Breakfast Lunch Dinner   Orange juice, 1/2 cup  Oatmeal, 1/2 cup  Scrambled eggs with cheese, 1/2 cup  Decaffeinated tea, 1 cup  Whole milk, 1 cup    Non-dairy creamer, 2 Tbsp  Pineapple juice, 1/2 cup  Minced beef, 3 oz  Gravy, 2 Tbsp  Mashed potatoes, 1/2 cup  Minced fresh broccoli, 1/2 cup  Applesauce, 1/2 cup  Coffee, 1 cup  Turkey, barley soup, 3/4 cup  Minced Hawaiian chicken, 3 oz  Mashed potatoes, 1/2 cup  Cooked spinach, 1/2 cup  Frozen yogurt, 1/2 cup  Non-dairy creamer, 2 Tbsp      LEVEL 3 = CHOPPED DIET  -After all the foods in level 2 (soft diet) are passing through well you should advance up to more chopped foods.  -It is still important to cut these foods into small pieces and eat slowly.  Hot Foods Cold Foods  Poultry Cottage cheese  Chopped Swedish meatballs Yogurt  Meat salads (ground or flaked meat) Milk  Flaked fish (tuna) Milkshakes  Poached or scrambled eggs Soft, cold, dry cereal  Souffles and omelets Fruit juices or nectars  Cooked cereals Chopped canned fruit  Chopped French toast or pancakes Canned fruit cocktail  Noodles or pasta (no rice) Pudding, mousse, custard  Cooked vegetables (no frozen peas, corn, or mixed vegetables) Green salad  Canned small sweet peas  Ice cream  Creamed soup or vegetable soup Fruit ice, Italian ice  Pureed vegetable soup or alphabet soup Non-dairy creamer  Ground scalloped apples Margarine  Gravies Mayonnaise  Sauces:  Cheese, creamed, barbecue, tomato, white Ketchup  Coffee or tea Mustard   SAMPLE MENU:  LEVEL 3 Breakfast Lunch Dinner   Orange juice, 1/2 cup  Oatmeal, 1/2 cup  Scrambled eggs with cheese, 1/2 cup  Decaffeinated tea, 1 cup  Whole milk, 1 cup  Non-dairy creamer, 2 Tbsp  Ketchup, 1 Tbsp  Margarine, 1 tsp  Salt, 1/4 tsp  Sugar, 2 tsp  Pineapple juice, 1/2 cup  Ground beef, 3 oz  Gravy, 2 Tbsp  Mashed potatoes, 1/2 cup  Cooked spinach, 1/2 cup  Applesauce, 1/2 cup  Decaffeinated coffee  Whole milk  Non-dairy creamer, 2 Tbsp  Margarine, 1 tsp  Salt, 1/4 tsp  Pureed turkey, barley soup, 3/4 cup  Barbecue chicken, 3 oz  Mashed potatoes, 1/2 cup  Ground fresh broccoli, 1/2 cup  Frozen yogurt, 1/2 cup  Decaffeinated tea, 1 cup  Non-dairy creamer, 2 Tbsp  Margarine, 1 tsp  Salt, 1/4 tsp  Sugar, 1 tsp    LEVEL 4:  REGULAR FOODS  -Foods in this group are soft, moist, regularly textured foods.   -This level includes meat and breads, which tend to be the hardest things to swallow.   -Eat very slowly, chew well and continue to avoid carbonated drinks. -most people are at this level in 4-6 weeks  Hot Foods Cold Foods  Baked fish or skinned Soft cheeses - cottage cheese  Souffles and omelets Cream cheese  Eggs Yogurt  Stuffed shells Milk  Spaghetti with meat sauce Milkshakes  Cooked cereal Cold dry cereals (no nuts, dried fruit, coconut)  French toast or pancakes Crackers  Buttered toast Fruit juices or nectars  Noodles or pasta (no rice) Canned fruit  Potatoes (all types) Ripe bananas  Soft, cooked vegetables (no corn, lima, or baked beans) Peeled, ripe, fresh fruit  Creamed soups or vegetable soup Cakes (no nuts, dried fruit, coconut)  Canned chicken  noodle soup Plain doughnuts  Gravies Ice cream  Bacon dressing Pudding, mousse, custard  Sauces:  Cheese, creamed, barbecue, tomato, white Fruit ice, Italian ice, sherbet  Decaffeinated tea or coffee Whipped gelatin  Pork chops Regular gelatin     Canned fruited gelatin molds   Sugar, syrup, honey, jam, jelly   Cream   Non-dairy   Margarine   Oil   Mayonnaise   Ketchup   Mustard   TROUBLESHOOTING IRREGULAR BOWELS  1) Avoid extremes of bowel movements (no bad constipation/diarrhea)  2) Miralax 17gm mixed in 8oz. water or juice-daily. May use BID as needed.  3) Gas-x,Phazyme, etc. as needed for gas & bloating.  4) Soft,bland diet. No spicy,greasy,fried foods.  5) Prilosec over-the-counter as needed  6) May hold gluten/wheat products from diet to see if symptoms improve.  7) May try probiotics (Align, Activa, etc) to help calm the bowels down  7) If symptoms become worse call back immediately.    If you have any questions please call our office at [facility]: [phone].  

## 2020-03-01 NOTE — Consult Note (Addendum)
The patient has been seen in conjunction with Cadence Furth PA-C. All aspects of care have been considered and discussed. The patient has been personally interviewed, examined, and all clinical data has been reviewed.   Patient is having sharp left subclavicular and bilateral shoulder discomfort.  Discomfort is worse with breathing.  Never had this type chest discomfort before.  Clean coronaries by CTA within the past 12 months.  EKG is nonischemic in appearance.  Ventricular pacing is noted however.  Suspect that this pain is due to serositis from either pleural or pericardial irritation.  Patient is not able to tolerate opiates.  Recommend Toradol 30 mg IV (single dose).  Will get serial cardiac markers to exclude cardiac injury.     Cardiology Consultation:   Patient ID: [name] MRN: [id number]; DOB: 14-May-1946  Admit date: 03/01/2020 Date of Consult: 03/01/2020  Primary Care Provider: Burnard Bunting, MD [facility] Cardiologist: No primary care provider on file.  [facility] Electrophysiologist:  Virl Axe, MD    Patient Profile:   [name] is a 74 y.o. female with a hx of CHB s/p PPM (2014 [employer]), pericardial effusion followed over the years, central polyneuropathy, HLD, hypothyroidism, orthostatic hypotension on midodrine, hypotension, dyspnea on exertion, hiatal hernia, dysphagia, and GERD who is being seen today for the evaluation of chest pain at the request of Dr. Rosendo Gros.  History of Present Illness:   Ms. Teston had a normal exercise Myoview stress test in 2010 with normal EF. She received PPM insertion for CHB in 2014. Has been followed for pericardial effusion over the years. Most recent echo 12/10/19 showed LVEF 50-55%, trivial pericardial effusion, mild aortic valve sclerosis, trivial MR. She had a coronary CTA ordered for chest pain 06/2019. This showed mild non-obstructive CAD, coronary calcium score of 2.   The patient  sees GI for hiatal hernia, chronic reflux, and dysphagia. It was suspected reflux was from the hernia so hernia repaire was recommended. The patient underwent hernia repair 03/01/20 and overall tolerated the procedure well. Post-procedure the patient complained of chest pain. On my interview she reports pleuritic type chest pain that is worse when she takes a deep breath in. It is sharp in nature about 6/10. It is non-radiating. She also feels nauseous and is shaky. No vomiting or diaphoresis. Breath feels short due to the pain. She has never felt this pain before. She was given Nitox2 without relief. Also given pain med. Pt feels pain is the same.    Past Medical History:  Diagnosis Date  . ADHD (attention deficit hyperactivity disorder)   . Allergy    trees/pollen, mold, fungus, dust mites. Takes allergy shots  . Arthritis    PAIN AND OA LEFT HIP  . Asthma    allergist Dr Olena Heckle- monthly allergy injections  . Autonomic dysfunction    CENTRAL NERVOUS SYSTEM NEUROPATHY - DX BY DR. Erling Cruz MORE THAN 10 YRS AGO - AND IT IS FELT TO CONTRIBUTE TO THE AUTOMIC  DYSFUNCTION-- PT HAS NUMBNESS LEGS AND FEET AND SOMETIIMES TIPS OF FINGER, SEVERE CONSTIPATION( NO SENSATION TO HAVE BM ), DOUBLE VISION, ORTHOSTATIC HYPOTENSION  . Blood transfusion   . Breast cancer ([place])    right side  . Bruises easily   . Cataract   . Chest pain    a. 12/2012 Cath: nl cors, EF 55-65%.  . Complication of anesthesia    reaction to some anesthetics/ 7/12 anesth record on chart- states prefers epidural  . CVA (cerebrovascular accident) (  [place]) 03/21/2018  . Depression   . Diverticulosis   . Dysrhythmia    HX OF HIGH GRADE HEART BLOCK - REQUIRED PACEMAKER INSERTION  . Esophageal stricture   . Gastritis   . GERD (gastroesophageal reflux disease)   . H/O hiatal hernia   . Hemorrhoids   . Hernia of abdominal wall    spigelian hernia RLQ - SURGERY TO REPAIR  . Hypothyroidism   . Interstitial cystitis   . Latex allergy,  contact dermatitis   . Mallory - Weiss tear    HEALED   . Neuromuscular disorder (HCC)    central nervous system neuropathy- seen per Dr Erling Cruz  . Pacemaker   . Pericardial effusion    a. 12/2012 following ppm placement;  b. 01/01/2013 Echo: EF 55-60%, small pericardial effusion w/o RV collapse-->No need for tap/window.  Marland Kitchen PONV (postoperative nausea and vomiting)    pt needs scop patch  . Recurrent upper respiratory infection (URI) 1/13- to present   bronchitis following surgery- states improved but still with cough. OV with [facility] Dr [name] 09/06/11 on chart  . Shortness of breath    AT TIMES - BUT MUCH IMPROVED AFTER PACEMAKER WAS REPROGRAMED.  Marland Kitchen Symptomatic bradycardia    a. 12/2012 s/p MDT dual chamber PPM, ser # [id number]; b. 12/2012 post-op course complicated by pericardial effusinon req lead revision.  . Thyroid disease     Past Surgical History:  Procedure Laterality Date  . ABDOMINAL HYSTERECTOMY  1974  . BACK SURGERY     cervical fusion 4-5 with plate  . BLADDER SUSPENSION    . BREAST BIOPSY  2002   NO BLOOD PRESSURES ON RIGHT SIDE/   s/p  axillary node dissection  . CYSTO WITH HYDRODISTENSION  09/07/2011   Procedure: CYSTOSCOPY/HYDRODISTENSION;  Surgeon: Ailene Rud, MD;  Location: [facility];  Service: Urology;  Laterality: N/A;  INSTILLATION OF MARCAINE/PYRIDIUM INSTILLATION OF MARCAINE/KENALOG  . CYSTOSCOPY  1975, 2007  . EYE SURGERY     LASIK EYE SURGERY BILATERAL  . LAPAROSCOPY  1973  . LEAD REVISION N/A 12/30/2012   Procedure: LEAD REVISION;  Surgeon: Evans Lance, MD;  Location[facility] CATH LAB;  Service: Cardiovascular;  Laterality: N/A;  . LEFT HEART CATHETERIZATION WITH CORONARY ANGIOGRAM N/A 12/29/2012   Procedure: LEFT HEART CATHETERIZATION WITH CORONARY ANGIOGRAM;  Surgeon: Peter M Martinique, MD;  Location: [facility] CATH LAB;  Service: Cardiovascular;  Laterality: N/A;  . MASTECTOMY MODIFIED RADICAL     right; with immediate reconstruction  . MYRINGOPLASTY  1962   . NECK SURGERY     c4-5 ruptured disc  . OOPHORECTOMY  1982  . PACEMAKER INSERTION    . PERMANENT PACEMAKER INSERTION N/A 12/29/2012   Procedure: PERMANENT PACEMAKER INSERTION;  Surgeon: Deboraha Sprang, MD;  Location[facility] C[facility] LAB;  Service: Cardiovascular;  Laterality: N/A;  . RECTOCELE REPAIR     with cystocele repair  . TONSILLECTOMY    . TOTAL HIP ARTHROPLASTY Right   . TOTAL HIP ARTHROPLASTY Left 06/12/2013   Procedure: LEFT TOTAL HIP ARTHROPLASTY ANTERIOR APPROACH;  Surgeon: Mcarthur Rossetti, MD;  Location: [facility];  Service: Orthopedics;  Laterality: Left;  . TYMPANOPLASTY  1973   right  . ULNAR NERVE TRANSPOSITION Right   . VENTRAL HERNIA REPAIR  05/30/2011   Procedure: HERNIA REPAIR VENTRAL ADULT;  Surgeon: Haywood Lasso, MD;  Location: [place];  Service: General;  Laterality: Right;  repair right spigelian hernia     Home Medications:  Prior  to Admission medications   Medication Sig Start Date End Date Taking? Authorizing Provider  Artificial Tear Ointment (DRY EYES OP) Place 1 drop into both eyes daily as needed (dry eye).   Yes [provider]  aspirin EC 81 MG EC tablet Take 1 tablet (81 mg total) by mouth daily. 03/25/18  Yes Donzetta Starch, NP  budesonide (ENTOCORT EC) 3 MG 24 hr capsule Take 3 mg by mouth daily.    Yes [provider]  cetirizine (ZYRTEC) 10 MG tablet Take 10 mg by mouth daily.    Yes [provider]  cholecalciferol (VITAMIN D) 1000 UNITS tablet Take 1,000 Units by mouth daily.   Yes [provider]  escitalopram (LEXAPRO) 10 MG tablet Take 10 mg by mouth daily.   Yes [provider]  estradiol (ESTRING) 2 MG vaginal ring Place 2 mg vaginally every 3 (three) months. follow package directions   Yes [provider]  levothyroxine (SYNTHROID) 88 MCG tablet Take 88 mcg by mouth daily. 12/07/19  Yes [provider]  Magnesium 500 MG CAPS Take 250 mg by mouth daily.    Yes  [provider]  Melatonin 1 MG TABS Take 1 tablet by mouth at bedtime.    Yes [provider]  midodrine (PROAMATINE) 10 MG tablet Take 1 tablet (10 mg total) by mouth 3 (three) times daily. Take last dose prior to 6pm. 02/18/20  Yes Deboraha Sprang, MD  ondansetron (ZOFRAN) 4 MG tablet Take 4 mg by mouth every 8 (eight) hours as needed for nausea.  01/14/20  Yes [provider]  potassium chloride (KLOR-CON) 10 MEQ tablet TAKE 1 TABLET DAILY Patient taking differently: Take 10 mEq by mouth daily.  06/03/19  Yes Deboraha Sprang, MD  Probiotic Product (ALIGN) 4 MG CAPS Take 4 mg by mouth at bedtime.    Yes [provider]  RABEprazole (ACIPHEX) 20 MG tablet TAKE 1 TABLET DAILY Patient taking differently: Take 20 mg by mouth 2 (two) times daily.  06/15/19  Yes Kozlow, Donnamarie Poag, MD  rosuvastatin (CRESTOR) 10 MG tablet Take 1 tablet (10 mg total) by mouth daily. Patient taking differently: Take 10 mg by mouth at bedtime.  07/03/18  Yes McCue, Janett Billow, NP  vitamin B-12 (CYANOCOBALAMIN) 1000 MCG tablet Take 1,000 mcg by mouth daily.   Yes [provider]  acetaminophen (TYLENOL) 325 MG tablet Take 325 mg by mouth every 4 (four) hours as needed for mild pain or moderate pain.     [provider]  albuterol (PROVENTIL HFA;VENTOLIN HFA) 108 (90 Base) MCG/ACT inhaler Inhale 2 puffs into the lungs every 6 (six) hours as needed. For shortness of breath Patient taking differently: Inhale 2 puffs into the lungs every 6 (six) hours as needed for wheezing or shortness of breath.  06/24/18   Kozlow, Donnamarie Poag, MD  meclizine (ANTIVERT) 25 MG tablet Take 25 mg by mouth 3 (three) times daily as needed for dizziness.     [provider]  Pentosan Polysulfate Sodium (ELMIRON PO) Take 1 capsule by mouth 2 (two) times daily as needed (cystitis).     [provider]    Inpatient Medications: Scheduled Meds: . amisulpride      . Chlorhexidine Gluconate Cloth  6  each Topical Once   And  . Chlorhexidine Gluconate Cloth  6 each Topical Once  . [START ON 03/02/2020] feeding supplement  296 mL Oral Once  . nitroGLYCERIN      .  nitroGLYCERIN       Continuous Infusions: . lactated ringers 300 mL/hr at 03/01/20 1155   PRN Meds: fentaNYL (SUBLIMAZE) injection  Allergies:    Allergies  Allergen Reactions  . Anesthetics, Amide Other (See Comments)    Patient unsure of names, however multiples cause swelling of airway & nausea  . Clindamycin Swelling  . Neomycin Swelling  . Shellfish Allergy Other (See Comments)    Neurotoxic reaction  . Sulfonamide Derivatives Anaphylaxis and Swelling  . Zolpidem Tartrate Other (See Comments)    Jerking motions   . Azithromycin Other (See Comments)    Severe gastritis   . Bactroban Other (See Comments)    Causes sores in nose  . Ciprofloxacin     REACTION: joint swelling  . Codeine Swelling  . Meperidine Hcl Nausea Only    Hallucinations   . Morphine Nausea Only    Hallucinations   . Neosporin [Neomycin-Polymyxin-Gramicidin] Hives and Dermatitis    All topical "orin's ointment"  . Nitrofurantoin     REACTION: neuropathy in legs  . Other     ALLERGIC TO WARM WATER SHELLFISH  PT STATES PLEASE NOTE SHE CAN TAKE OXYCODONE AND TYLENOL FOR PAIN -NOT ALLERGIC  . Penicillins Other (See Comments)    Swelling in joints  . Tramadol     Makes jerk  . Latex Rash    Social History:   Social History   Socioeconomic History  . Marital status: Married    Spouse name: Not on file  . Number of children: 2  . Years of education: 47  . Highest education level: Not on file  Occupational History  . 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 Smoking status: Never Smoker  . Smokeless tobacco: Never Used  Vaping Use  . Vaping Use: Never used  Substance and Sexual Activity  . Alcohol use: Yes     Alcohol/week: 7.0 standard drinks    Types: 7 Glasses of wine per week    Comment: occasional  . Drug use: No  . Sexual activity: Not on file  Other Topics Concern  . Not on file  Social History Narrative   Lives at home w/ her husband   Patient drinks 1-2 cup of caffeine daily.   Patient is right handed.   Social Determinants of Health   Financial Resource Strain:   . Difficulty of Paying Living Expenses: Not on file  Food Insecurity:   . Worried About Charity fundraiser in the Last Year: Not on file  . Ran Out of Food in the Last Year: Not on file  Transportation Needs:   . Lack of Transportation (Medical): Not on file  . Lack of Transportation (Non-Medical): Not on file  Physical Activity:   . Days of Exercise per Week: Not on file  . Minutes of Exercise per Session: Not on file  Stress:   . Feeling of Stress : Not on file  Social Connections:   . Frequency of Communication with Friends and Family: Not on file  . Frequency of Social Gatherings with Friends and Family: Not on file  . Attends Religious Services: Not on file  . Active Member of Clubs or Organizations: Not on file  . Attends Archivist Meetings: Not on file  . Marital Status: Not on file  Intimate Partner Violence:   . Fear  of Current or Ex-Partner: Not on file  . Emotionally Abused: Not on file  . Physically Abused: Not on file  . Sexually Abused: Not on file    Family History:   Family History  Problem Relation Age of Onset  . Heart disease Father   . Colon cancer Mother 31  . Depression Mother   . Pancreatic cancer Sister 60  . Diverticulitis Sister   . Uterine cancer Maternal Grandmother   . Heart disease Brother   . Heart disease Paternal Grandmother   . Heart disease Paternal Grandfather   . Throat cancer Maternal Uncle   . Heart disease Paternal Aunt   . Heart disease Paternal Uncle   . Heart disease Brother   . Thrombosis Maternal Aunt   . Cancer Maternal Uncle         NOS  . Diabetes Other        aunt     ROS:  Please see the history of present illness.  All other ROS reviewed and negative.     Physical Exam/Data:   Vitals:   03/01/20 0955 03/01/20 1356  BP: (!) 183/84 (!) 145/72  Pulse: (!) 59 79  Resp: 18 19  Temp: (!) 97.1 F (36.2 C) 97.7 F (36.5 C)  TempSrc: Temporal   SpO2: 100% 100%  Weight: 70.1 kg   Height: 5\' 7"  (1.702 m)     Intake/Output Summary (Last 24 hours) at 03/01/2020 1529 Last data filed at 03/01/2020 1357 Gross per 24 hour  Intake 1000 ml  Output 255 ml  Net 745 ml   Last 3 Weights 03/01/2020 02/24/2020 02/09/2020  Weight (lbs) 154 lb 8 oz 156 lb 11.2 oz 159 lb  Weight (kg) 70.081 kg 71.079 kg 72.122 kg     Body mass index is 24.2 kg/m.  General:  Well nourished, well developed, in no acute distress HEENT: normal Lymph: no adenopathy Neck: no JVD Endocrine:  No thryomegaly Vascular: No carotid bruits; FA pulses 2+ bilaterally without bruits  Cardiac:  normal S1, S2; RRR; no murmur  Lungs:  clear to auscultation bilaterally, no wheezing, rhonchi or rales  Abd: soft, nontender, no hepatomegaly  Ext: no edema Musculoskeletal:  No deformities, BUE and BLE strength normal and equal Skin: warm and dry  Neuro:  CNs 2-12 intact, no focal abnormalities noted Psych:  Normal affect   EKG:  The EKG was personally reviewed and demonstrates:  ordered Telemetry:  Telemetry was personally reviewed and demonstrates:  N/A  Relevant CV Studies:  Coronary CTA 06/2019  IMPRESSION: 1. Coronary calcium score of 2. This was 29 percentile for age and sex matched control.  2. Normal coronary origin with right dominance.  3. CAD-RADS 1. Minimal non-obstructive CAD (0-24%). Consider non-atherosclerotic causes of chest pain. Consider preventive therapy and risk factor modification.  4.  Dual chamber PM is in place.   Echo 12/10/19 1. Abnormal septal motion from pacing Abnormal GLS -11. Left ventricular   ejection fraction, by estimation, is 50 to 55%. The left ventricle has low  normal function. The left ventricle has no regional wall motion  abnormalities. Left ventricular diastolic  parameters are indeterminate.  2. Pacing wires seen in RA/RV. Right ventricular systolic function is  normal. The right ventricular size is normal. There is normal pulmonary  artery systolic pressure.  3. The mitral valve is normal in structure. Trivial mitral valve  regurgitation. No evidence of mitral stenosis.  4. The aortic valve is tricuspid. Aortic valve regurgitation is not  visualized. Mild aortic valve sclerosis is present, with no evidence of  aortic valve stenosis.  5. The inferior vena cava is normal in size with greater than 50%  respiratory variability, suggesting right atrial pressure of 3 mmHg.   Exercise Myoview 02/2009 Normal stress test and normal LV function  Laboratory Data:  High Sensitivity Troponin:  No results for input(s): TROPONINIHS in the last 720 hours.   Chemistry Recent Labs  Lab 02/24/20 1200  NA 140  K 3.9  CL 103  CO2 28  GLUCOSE 86  BUN 11  CREATININE 0.89  CALCIUM 9.9  GFRNONAA >60  ANIONGAP 9    Recent Labs  Lab 02/24/20 1200  PROT 6.7  ALBUMIN 4.2  AST 23  ALT 24  ALKPHOS 45  BILITOT 0.9   Hematology Recent Labs  Lab 02/24/20 1200  WBC 6.1  RBC 4.56  HGB 14.0  HCT 43.1  MCV 94.5  MCH 30.7  MCHC 32.5  RDW 13.1  PLT 269   BNPNo results for input(s): BNP, PROBNP in the last 168 hours.  DDimer No results for input(s): DDIMER in the last 168 hours.   Radiology/Studies:  No results found.   Assessment and Plan:   Pleuritic chest pain - Patient complained of CP post-hernia repair in the PACU. Sounds pleuritic in nature, sharp and worse with a deep breath. Not responsive to Nitro. Has never had this type of pain before. Suspected secondary to gas administration during surgery.  -EKG showed a sensed V paced rhythm with no acute  changes.  - trend HS troponin - Cardiac CT 06/2019 showed mild non-obstructive CAD - Echo 11/2019 LVEF 50-55%, trivial pericardial effusion, mild aortic valve sclerosis, trivial MR.  - Suspect non-cardiac pain. However if pain persists can get an echo. Can also consider CXR. Will check EKG in the morning.   Recurrent pericardial effusion - most recent echo showed trivial effusion with LVEF 50-55% - can repeat echo if pain persists  CHB s/p PPM - device check 02/09/20 showed normal functioning device  For questions or updates, please contact [place] Please consult [email or web address] for contact info under    Signed, Cadence Ninfa Meeker, PA-C  03/01/2020 3:29 PM

## 2020-03-01 NOTE — Anesthesia Postprocedure Evaluation (Signed)
Anesthesia Post Note  Patient: [name]  Procedure(s) Performed: XI ROBOTIC ASSISTED HIATAL HERNIA REPAIR AND PARTIAL NISSEN FUNDOPLICATION (N/A Abdomen)     Patient location during evaluation: PACU Anesthesia Type: General Level of consciousness: awake Pain management: pain level controlled Respiratory status: spontaneous breathing Cardiovascular status: stable Postop Assessment: no apparent nausea or vomiting Anesthetic complications: no   No complications documented.  Last Vitals:  Vitals:   03/01/20 0955 03/01/20 1356  BP: (!) 183/84 (!) 145/72  Pulse: (!) 59 79  Resp: 18 19  Temp: (!) 36.2 C 36.5 C  SpO2: 100% 100%    Last Pain:  Vitals:   03/01/20 1600  TempSrc:   PainSc: (P) 7                  Brighton Delio

## 2020-03-01 NOTE — Anesthesia Procedure Notes (Signed)
Procedure Name: Intubation Date/Time: 03/01/2020 11:42 AM Performed by: Terrence Dupont, RN Pre-anesthesia Checklist: Patient identified, Patient being monitored, Timeout performed, Emergency Drugs available and Suction available Patient Re-evaluated:Patient Re-evaluated prior to induction Oxygen Delivery Method: Circle System Utilized Preoxygenation: Pre-oxygenation with 100% oxygen Induction Type: IV induction, Rapid sequence and Cricoid Pressure applied Laryngoscope Size: 3 and Glidescope Grade View: Grade I Tube type: Oral Tube size: 7.0 mm Number of attempts: 1 Airway Equipment and Method: Stylet Placement Confirmation: ETT inserted through vocal cords under direct vision,  positive ETCO2 and breath sounds checked- equal and bilateral Secured at: 21 cm Tube secured with: Tape Dental Injury: Teeth and Oropharynx as per pre-operative assessment

## 2020-03-01 NOTE — Op Note (Signed)
03/01/2020  1:33 PM  PATIENT:  [name]  74 y.o. female  PRE-OPERATIVE DIAGNOSIS:  HIATAL HERNIA  POST-OPERATIVE DIAGNOSIS:  HIATAL HERNIA  PROCEDURE:  Procedure(s): XI ROBOTIC ASSISTED HIATAL HERNIA REPAIR  WITH MESH AND PARTIAL TOUPET FUNDOPLICATION (N/A)  SURGEON:  Surgeon(s) and Role:    Ralene Ok, MD - Primary  ANESTHESIA:   none  EBL:  5 mL   BLOOD ADMINISTERED:none  DRAINS: none   LOCAL MEDICATIONS USED:  NONE  SPECIMEN:  No Specimen  DISPOSITION OF SPECIMEN:  N/A  COUNTS:  YES  TOURNIQUET:  * No tourniquets in log *  DICTATION: .Dragon Dictation The patient was taken back to the operating room and placed in the supine position with bilateral SCDs in place. The patient was prepped and draped in the usual sterile fashion. After appropriate antibiotics were confirmed a timeout was called and all facts were verified.   A Veress needle technique was used to insufflate the abdomen to 15 mm of mercury the paramedian stab incision. Subsequent to this an 8 mm trocar was introduced as was a 8 millimeter camera. At this time the subsequent robotic trochars x3, were then placed adjacent to this trocar approximately 8-10 cm away. Each trocar was inserted under direct visualization, there were total of 4 trochars. The assistant trocar was then placed in the right lower quadrant under direct visualization. The Nathanson retractor was then visualized inserted into the abdomen and the incision just to the left of the falciform ligament. This was then placed to retract the liver appropriately. At this time the patient was positioned in reverse Trendelenburg.   At this time the robot patient cart was brought to the bedside and placed in good position and the arms were docked to the trochars appropriately. At this time I proceeded to incised the gastrohepatic ligament.  At this time I proceeded to mobilize the stomach inferiorly and visualize the right crus. The  peritoneum over the right crus was incised and right crus was identified. I proceeded to dissect this inferiorly until the left crus was seen joining the right crus. Once the right crus was adequately dissected we turned our to the left crus which was dissected away. This required traction of the stomach to the right side. Once this was visualized we then proceeded to circumferentially dissect the esophagus away from the surrounding tissue. There was some chronic scar tissue around the esophagus  That was dissected.  The anterior and posterior vagus was seen along the esophagus at the GE junction.  These were both preserved throughout the entire case.  At this time the phrenoesophageal fat pad was dissected away from the esophagus. There was a moderate-sized hiatal hernia seen. I mobilized the esophagus cephalad approximately 4-5 cm, clearing away the surrounding tissue.   At this time we turned our attention to the greater curvature the stomach and the omentum was mobilized using the robotic vessel sealer. This was taken up to the greater curvature to the hiatus. This mobilized the entire greater curvature to allow mobilization and the wrap. I then proceeded to bring the greater curvature the stomach posterior to the esophagus, and a shoeshine technique was used to evaluate the mobilization of the greater curvature.   At this time I proceeded to close the hiatus using Ethibonds x 3 in an interrupted fashion. This brought together the hiatal closure without undue stricture to the esophagus.   A piece of Gore Bio A hiatal mesh was placed over the hiatal  closure and sutured to the crus using 2-0 Ethibond sutures x 4.  At this time the greater curvature was brought around the esophagus and sutured using 0 silk sutures interrupted fashion approximately 1 cm apart x3 on each side of the esophagus in a Toupet fashion. A left collar stitch was then used to gastropexy the stomach from the wrap to the diaphragm just  lateral to the left crus as. The wrap lay loose with no strangulation of the esophagus.  At this time the robot was undocked. The liver trocar was removed. At this time insufflation was evacuated. Skin was reapproximated for Monocryl subcuticular fashion. The skin was then dressed with Dermabond. The patient tolerated the procedure well and was taken to the recovery room in stable condition.     PLAN OF CARE: Admit for overnight observation  PATIENT DISPOSITION:  PACU - hemodynamically stable.   Delay start of Pharmacological VTE agent (>24hrs) due to surgical blood loss or risk of bleeding: not applicable

## 2020-03-01 NOTE — H&P (Signed)
History of Present Illness  The patient is a 74 year old female who presents with a complaint of epigastric abdominal pain. Patient is a 73 year old female PulsePak today secondary to epigastric abdominal pain. I was able to receive the patient's esophageal manometry report as well as pH probe study from [facility] this was from January 2019. Patient states that since that time her reflux has increased in severity. Patient's manometry test reveals 50% or more ineffective swallows. There is a normal lower esophageal sphincter tone, patient pH probe study gives her a DeMeester score of 13 with normal being less than 22. Patient underwent gastric emptying study which revealed normal gastric emptying.  Patient comments today that she continues with early satiety, reflux, as a burning, and epigastric pain.     ------------------------ Patient is a 74 year old female who comes in secondary to referral secondary to epigastric abdominal pain. Patient comes in with a significant history of recent dysphagia, reflux symptoms, shortness of breath. Patient had previous workup at [facility] to include endoscopy, manometry, and pH probe study. I do not have results of the manometry and/or pH probe study. She also presented on ultrasound as well as CT scan. I do not have those results currently. We will recheck to obtain these results.  Patient states that she does have some dysphagia, reflux type symptoms. She does state that she has difficulty with liquids and solids. She states that she has emesis of gastric acid however no solid food. This is also complicated by a history of orthostatic hypotension.  Patient states that at some point in the past she has had a study for gastric emptying however this was in [place] in the remote past.    She's had multiple previous abdominal operations to include spigelian repair as well as cystocele repair   Allergies Anesthetics, Amide  Clindamycin   Neomycin  Shellfish  Sulfonamide Derivatives  Ambien *HYPNOTICS/SEDATIVES/SLEEP DISORDER AGENTS*  Azithromycin *CHEMICALS*  Bactroban *DERMATOLOGICALS*  Ciprofloxacin *CHEMICALS*  Codeine Phosphate *ANALGESICS - OPIOID*  Meperidine HCl *ANALGESICS - OPIOID*  Morphine Sulfate (Concentrate) *ANALGESICS - OPIOID*  Neosporin *DERMATOLOGICALS*  Nitrofurantoin *ANTI-INFECTIVE AGENTS - MISC.*  Penicillins  Tramadol  Latex   Medication History  Vitamin C (500MG  Tablet Chewable, Oral) Active. Rosuvastatin Calcium (10MG  Tablet, Oral) Active. RABEprazole Sodium (20MG  Tablet DR, Oral) Active. Potassium Chloride Crys ER (10MEQ Tablet ER, Oral) Active. Midodrine HCl (5MG  Tablet, Oral) Active. Melatonin (1MG  Tablet, Oral) Active. Magnesium (500MG  Tablet, Oral) Active. Levothyroxine Sodium (88MCG Tablet, Oral) Active. Flonase Allergy Relief (50MCG/ACT Suspension, Nasal) Active. Lidex (0.05% Solution, External) Active. Estring (2MG  Ring, Vaginal) Active. Escitalopram Oxalate (10MG  Tablet, Oral) Active. Vitamin D (1000UNIT Tablet, Oral) Active. ZyrTEC (10MG  Tablet, Oral) Active. Budesonide (3MG  Capsule DR Part, Oral) Active. B Complex (Oral) Active. Aspirin (81MG  Tablet, Oral) Active. Align (4MG  Capsule, Oral) Active. Medications Reconciled    Review of Systems General Present- Appetite Loss and Weight Loss. Not Present- Chills, Fatigue, Fever, Night Sweats and Weight Gain. Skin Present- Dryness. Not Present- Change in Wart/Mole, Hives, Jaundice, New Lesions, Non-Healing Wounds, Rash and Ulcer. HEENT Present- Hearing Loss and Ringing in the Ears. Not Present- Earache, Hoarseness, Nose Bleed, Oral Ulcers, Seasonal Allergies, Sinus Pain, Sore Throat, Visual Disturbances, Wears glasses/contact lenses and Yellow Eyes. Respiratory Not Present- Bloody sputum, Chronic Cough, Difficulty Breathing, Snoring and Wheezing. Cardiovascular Present- Shortness of  Breath. Not Present- Chest Pain, Difficulty Breathing Lying Down, Leg Cramps, Palpitations, Rapid Heart Rate and Swelling of Extremities. Gastrointestinal Present- Abdominal Pain, Bloating, Chronic diarrhea, Gets full quickly  at meals, Nausea and Vomiting. Not Present- Bloody Stool, Change in Bowel Habits, Constipation, Difficulty Swallowing, Excessive gas, Hemorrhoids, Indigestion and Rectal Pain. Female Genitourinary Present- Nocturia and Urgency. Not Present- Frequency, Painful Urination and Pelvic Pain. Musculoskeletal Present- Joint Stiffness and Muscle Weakness. Not Present- Back Pain, Joint Pain, Muscle Pain and Swelling of Extremities. Neurological Present- Weakness. Not Present- Decreased Memory, Fainting, Headaches, Numbness, Seizures, Tingling, Tremor and Trouble walking. Psychiatric Not Present- Anxiety, Bipolar, Change in Sleep Pattern, Depression, Fearful and Frequent crying. Endocrine Not Present- Cold Intolerance, Excessive Hunger, Hair Changes, Heat Intolerance, Hot flashes and New Diabetes. Hematology Present- Blood Thinners and Easy Bruising. Not Present- Excessive bleeding, Gland problems, HIV and Persistent Infections.  BP (!) 183/84   Pulse (!) 59   Temp (!) 97.1 F (36.2 C) (Temporal)   Resp 18   Ht 5\' 7"  (1.702 m)   Wt 70.1 kg   SpO2 100%   BMI 24.20 kg/m    Physical Exam  The physical exam findings are as follows: Note: Constitutional: No acute distress, conversant, appears stated age  Eyes: Anicteric sclerae, moist conjunctiva, no lid lag  Neck: No thyromegaly, trachea midline, no cervical lymphadenopathy  Lungs: Clear to auscultation biilaterally, normal respiratory effot  Cardiovascular: regular rate & rhythm, no murmurs, no peripheal edema, pedal pulses 2+  GI: Soft, no masses or hepatosplenomegaly, non-tender to palpation  MSK: Normal gait, no clubbing cyanosis, edema  Skin: No rashes, palpation reveals normal skin turgor  Psychiatric:  Appropriate judgment and insight, oriented to person, place, and time    Assessment & Plan  CHRONIC REFLUX ESOPHAGITIS (K21.00) Impression: Patient is a 74 year old female, with chronic reflux, small hiatal hernia, with some added dysphasia. In looking at her labs and discussed with her the increased severity it appears that the patient likely is suffering from reflux likely caused from hiatal hernia. I discussed with her that her pictures complex secondary to the fact that some of her studies, manometry, pH probe study could be done off-duty hiatal hernia. I believe her early satiety, dysphagia is likely due to her hiatal hernia.  1. At this time would recommend robotic hiatal hernia repair with mesh and partial fundoplication. 2. Discussed with patient the risks and benefits of the procedure to include but not limited to: Infection, bleeding, damage to structures, possible pneumothorax, possible recurrence. The patient voiced understanding and wishes to proceed. HIATAL HERNIA (K44.9)

## 2020-03-01 NOTE — Transfer of Care (Signed)
Immediate Anesthesia Transfer of Care Note  Patient: [name]  Procedure(s) Performed: XI ROBOTIC ASSISTED HIATAL HERNIA REPAIR AND PARTIAL NISSEN FUNDOPLICATION (N/A Abdomen)  Patient Location: PACU  Anesthesia Type:General  Level of Consciousness: awake, alert , patient cooperative and responds to stimulation  Airway & Oxygen Therapy: Patient Spontanous Breathing and Patient connected to face mask oxygen  Post-op Assessment: Report given to RN and Post -op Vital signs reviewed and stable  Post vital signs: Reviewed and stable  Last Vitals:  Vitals Value Taken Time  BP 145/72 03/01/20 1356  Temp    Pulse 78 03/01/20 1357  Resp 16 03/01/20 1357  SpO2 100 % 03/01/20 1357  Vitals shown include unvalidated device data.  Last Pain:  Vitals:   03/01/20 0955  TempSrc: Temporal         Complications: No complications documented.

## 2020-03-02 ENCOUNTER — Inpatient Hospital Stay (HOSPITAL_COMMUNITY): Payer: Medicare Other

## 2020-03-02 DIAGNOSIS — Z79899 Other long term (current) drug therapy: Secondary | ICD-10-CM | POA: Diagnosis not present

## 2020-03-02 DIAGNOSIS — Z91040 Latex allergy status: Secondary | ICD-10-CM | POA: Diagnosis not present

## 2020-03-02 DIAGNOSIS — J45909 Unspecified asthma, uncomplicated: Secondary | ICD-10-CM | POA: Diagnosis not present

## 2020-03-02 DIAGNOSIS — K2100 Gastro-esophageal reflux disease with esophagitis, without bleeding: Secondary | ICD-10-CM | POA: Diagnosis not present

## 2020-03-02 DIAGNOSIS — I313 Pericardial effusion (noninflammatory): Secondary | ICD-10-CM | POA: Diagnosis not present

## 2020-03-02 DIAGNOSIS — K224 Dyskinesia of esophagus: Secondary | ICD-10-CM | POA: Diagnosis not present

## 2020-03-02 DIAGNOSIS — E039 Hypothyroidism, unspecified: Secondary | ICD-10-CM | POA: Diagnosis not present

## 2020-03-02 DIAGNOSIS — Z7982 Long term (current) use of aspirin: Secondary | ICD-10-CM | POA: Diagnosis not present

## 2020-03-02 DIAGNOSIS — Z950 Presence of cardiac pacemaker: Secondary | ICD-10-CM | POA: Diagnosis not present

## 2020-03-02 DIAGNOSIS — Z853 Personal history of malignant neoplasm of breast: Secondary | ICD-10-CM | POA: Diagnosis not present

## 2020-03-02 DIAGNOSIS — Z9889 Other specified postprocedural states: Secondary | ICD-10-CM | POA: Diagnosis not present

## 2020-03-02 DIAGNOSIS — K449 Diaphragmatic hernia without obstruction or gangrene: Secondary | ICD-10-CM | POA: Diagnosis not present

## 2020-03-02 DIAGNOSIS — R0781 Pleurodynia: Secondary | ICD-10-CM | POA: Diagnosis not present

## 2020-03-02 MED ORDER — IOHEXOL 300 MG/ML  SOLN
150.0000 mL | Freq: Once | INTRAMUSCULAR | Status: AC | PRN
  Administered 2020-03-02: 150 mL via ORAL

## 2020-03-02 MED ORDER — KETOROLAC TROMETHAMINE 15 MG/ML IJ SOLN: 15.0000 mg | Freq: Four times a day (QID) | INTRAMUSCULAR | Status: DC | PRN

## 2020-03-02 NOTE — Discharge Summary (Signed)
Physician Discharge Summary  Patient ID: [name] MRN: [id number] DOB/AGE: 05/19/1945 74 y.o.  Admit date: 03/01/2020 Discharge date: 03/02/2020  Admission Diagnoses: Hiatal hernia, chronic reflux  Discharge Diagnoses:  Status post robotic hiatal hernia repair with mesh, toupet fundoplication Discharged Condition: good  Hospital Course:  Patient is a 74 year old female with a large hiatal hernia and chronic reflux.  Patient underwent robotic hiatal hernia repair with mesh and fundoplication.  Please see op note for full details.  Patient was kept n.p.o. until postop day 1.  Patient with esophagram which revealed no leak.  Patient started on liquid diet.  She tolerated this well.  She did have some upper shoulder gas pain however was ambulating well.  Patient had no incisional hernia pain.  Patient was afebrile, deemed stable for discharge and discharged home.  Consults: none  Significant Diagnostic Studies: Esophagram with no leak  Treatments: surgery: As above  Discharge Exam: Blood pressure 130/65, pulse 64, temperature 98.2 F (36.8 C), temperature source Oral, resp. rate 17, height 5\' 7"  (1.702 m), weight 70.1 kg, SpO2 95 %. General appearance: alert and cooperative GI: soft, non-tender; bowel sounds normal; no masses,  no organomegaly  Disposition: Discharge disposition: 01-Home or Self Care       Discharge Instructions    Diet - low sodium heart healthy   Complete by: As directed    Increase activity slowly   Complete by: As directed      Allergies as of 03/02/2020      Reactions   Anesthetics, Amide Other (See Comments)   Patient unsure of names, however multiples cause swelling of airway & nausea   Clindamycin Swelling   Neomycin Swelling   Shellfish Allergy Other (See Comments)   Neurotoxic reaction   Sulfonamide Derivatives Anaphylaxis, Swelling   Zolpidem Tartrate Other (See Comments)   Jerking motions    Azithromycin Other (See  Comments)   Severe gastritis    Bactroban Other (See Comments)   Causes sores in nose   Ciprofloxacin    REACTION: joint swelling   Codeine Swelling   Meperidine Hcl Nausea Only   Hallucinations   Morphine Nausea Only   Hallucinations    Neosporin [neomycin-polymyxin-gramicidin] Hives, Dermatitis   All topical "orin's ointment"   Nitrofurantoin    REACTION: neuropathy in legs   Other    ALLERGIC TO WARM WATER SHELLFISH PT STATES PLEASE NOTE SHE CAN TAKE OXYCODONE AND TYLENOL FOR PAIN -NOT ALLERGIC   Penicillins Other (See Comments)   Swelling in joints   Tramadol    Makes jerk   Latex Rash      Medication List    TAKE these medications   acetaminophen 325 MG tablet Commonly known as: TYLENOL Take 325 mg by mouth every 4 (four) hours as needed for mild pain or moderate pain.   albuterol 108 (90 Base) MCG/ACT inhaler Commonly known as: VENTOLIN HFA Inhale 2 puffs into the lungs every 6 (six) hours as needed. For shortness of breath What changed:   reasons to take this  additional instructions   Align 4 MG Caps Take 4 mg by mouth at bedtime.   aspirin 81 MG EC tablet Take 1 tablet (81 mg total) by mouth daily.   budesonide 3 MG 24 hr capsule Commonly known as: ENTOCORT EC Take 3 mg by mouth daily.   cetirizine 10 MG tablet Commonly known as: ZYRTEC Take 10 mg by mouth daily.   cholecalciferol 1000 units tablet Commonly known as: VITAMIN D Take  1,000 Units by mouth daily.   DRY EYES OP Place 1 drop into both eyes daily as needed (dry eye).   ELMIRON PO Take 1 capsule by mouth 2 (two) times daily as needed (cystitis).   escitalopram 10 MG tablet Commonly known as: LEXAPRO Take 10 mg by mouth daily.   estradiol 2 MG vaginal ring Commonly known as: ESTRING Place 2 mg vaginally every 3 (three) months. follow package directions   levothyroxine 88 MCG tablet Commonly known as: SYNTHROID Take 88 mcg by mouth daily.   Magnesium 500 MG Caps Take 250 mg  by mouth daily.   meclizine 25 MG tablet Commonly known as: ANTIVERT Take 25 mg by mouth 3 (three) times daily as needed for dizziness.   melatonin 1 MG Tabs tablet Take 1 tablet by mouth at bedtime.   midodrine 10 MG tablet Commonly known as: PROAMATINE Take 1 tablet (10 mg total) by mouth 3 (three) times daily. Take last dose prior to 6pm.   ondansetron 4 MG tablet Commonly known as: ZOFRAN Take 4 mg by mouth every 8 (eight) hours as needed for nausea.   potassium chloride 10 MEQ tablet Commonly known as: KLOR-CON TAKE 1 TABLET DAILY   RABEprazole 20 MG tablet Commonly known as: ACIPHEX TAKE 1 TABLET DAILY What changed: when to take this   rosuvastatin 10 MG tablet Commonly known as: Crestor Take 1 tablet (10 mg total) by mouth daily. What changed: when to take this   vitamin B-12 1000 MCG tablet Commonly known as: CYANOCOBALAMIN Take 1,000 mcg by mouth daily.       Follow-up Information    Ralene Ok, MD.   Specialty: General Surgery Contact information: [address] [place] [address] [phone]               Signed: Ralene Ok 03/02/2020, 12:55 PM

## 2020-03-02 NOTE — Care Management CC44 (Signed)
Condition Code 44 Documentation Completed  Patient Details  Name: [name] MRN: [id number] Date of Birth: 1945-06-10   Condition Code 44 given:  Yes Patient signature on Condition Code 44 notice:  Yes Documentation of 2 MD's agreement:  Yes Code 44 added to claim:  Yes    Marilu Favre, RN 03/02/2020, 1:17 PM

## 2020-03-02 NOTE — Progress Notes (Signed)
[name] to be D/C'd per MD order. Discussed with the patient and all questions fully answered. ? VSS, Skin clean, dry and intact without evidence of skin break down, no evidence of skin tears noted. ? IV catheter discontinued intact. Site without signs and symptoms of complications. Dressing and pressure applied. ? An After Visit Summary was printed and given to the patient. Patient informed where to pickup prescriptions. ? D/c education completed with patient/family including follow up instructions, medication list, d/c activities limitations if indicated, with other d/c instructions as indicated by MD - patient able to verbalize understanding, all questions fully answered.  ? Patient instructed to return to ED, call 911, or call MD for any changes in condition.  ? Patient to be escorted via [place], and D/C home via private auto.

## 2020-03-02 NOTE — Care Management Obs Status (Signed)
[place] NOTIFICATION   Patient Details  Name: [name] MRN: [id number] Date of Birth: 08-13-1945   Medicare Observation Status Notification Given:  Yes    Marilu Favre, RN 03/02/2020, 1:17 PM

## 2020-03-02 NOTE — Progress Notes (Signed)
Progress Note  Patient Name: [name] Date of Encounter: 03/02/2020  [place] Cardiologist: No primary care provider on file.   Subjective   Still uncomfortable.  Pain is less severe than yesterday.  Discomfort is less pleuritic.  No breathing, rhythm, and blood pressure is stable.  She completed swallow study this morning..  Inpatient Medications    Scheduled Meds: . enoxaparin (LOVENOX) injection  40 mg Subcutaneous Q24H  . [START ON 03/04/2020] levothyroxine  44 mcg Intravenous Daily   Continuous Infusions: . dextrose 5 % and 0.9% NaCl 75 mL/hr at 03/02/20 0700   PRN Meds: albuterol, hydrALAZINE, ketorolac, morphine injection, ondansetron **OR** ondansetron (ZOFRAN) IV   Vital Signs    Vitals:   03/01/20 1650 03/01/20 1719 03/01/20 2005 03/02/20 0540  BP:  140/81 137/71 130/65  Pulse: 73 75 78 64  Resp: 14 18 18 17   Temp: (!) 97.2 F (36.2 C) 97.9 F (36.6 C) 98 F (36.7 C) 98.2 F (36.8 C)  TempSrc:  Oral Oral Oral  SpO2: 100% 100% 95% 95%  Weight:      Height:        Intake/Output Summary (Last 24 hours) at 03/02/2020 0854 Last data filed at 03/02/2020 0700 Gross per 24 hour  Intake 1941.22 ml  Output 255 ml  Net 1686.22 ml   Last 3 Weights 03/01/2020 02/24/2020 02/09/2020  Weight (lbs) 154 lb 8 oz 156 lb 11.2 oz 159 lb  Weight (kg) 70.081 kg 71.079 kg 72.122 kg      Telemetry    Not reviewed- Personally Reviewed  ECG    Atrial sensing with ventricular pacing, and unchanged from preop EKG on 02/09/2020.- Personally Reviewed  Physical Exam  Good skin color GEN: No acute distress.   Neck: No JVD Cardiac: No pericardial rub Respiratory:  No pleural rub GI: Soft, nontender, non-distended  MS: No edema; No deformity. Neuro:  Nonfocal  Psych: Normal affect   Labs    High Sensitivity Troponin:   Recent Labs  Lab 03/01/20 1638 03/01/20 1825  TROPONINIHS 9 13      Chemistry Recent Labs  Lab 02/24/20 1200  NA 140  K  3.9  CL 103  CO2 28  GLUCOSE 86  BUN 11  CREATININE 0.89  CALCIUM 9.9  PROT 6.7  ALBUMIN 4.2  AST 23  ALT 24  ALKPHOS 45  BILITOT 0.9  GFRNONAA >60  ANIONGAP 9     Hematology Recent Labs  Lab 02/24/20 1200  WBC 6.1  RBC 4.56  HGB 14.0  HCT 43.1  MCV 94.5  MCH 30.7  MCHC 32.5  RDW 13.1  PLT 269    BNPNo results for input(s): BNP, PROBNP in the last 168 hours.   DDimer No results for input(s): DDIMER in the last 168 hours.   Radiology    DG ESOPHAGUS W SINGLE CM (SOL OR THIN BA)  Result Date: 03/02/2020 CLINICAL DATA:  Postop day 1 hiatal hernia repair EXAM: ESOPHOGRAM/BARIUM SWALLOW TECHNIQUE: Single contrast examination was performed using water-soluble Omnipaque 300 p.o. FLUOROSCOPY TIME:  Fluoroscopy Time:  1 minutes 18 seconds Radiation Exposure Index (if provided by the fluoroscopic device): Number of Acquired Spot Images: 6 COMPARISON:  Esophagram 02/01/2020 FINDINGS: Mild decreased esophageal motility. No esophageal stricture or mass. Negative for hiatal hernia. No leak identified. Contrast flows readily into the stomach which is nondilated. IMPRESSION: Satisfactory reduction and repair of hiatal hernia. No obstruction or leak identified. Electronically Signed   By: Franchot Gallo M.D.  On: 03/02/2020 08:51    Cardiac Studies   Cardiac markers were negative for any evidence of myocardial injury.  Patient Profile     73 y.o. female CHB s/p PPM (2014 [employer]), pericardial effusion followed over the years, central polyneuropathy, HLD, hypothyroidism, orthostatic hypotension on midodrine, hypotension, dyspnea on exertion, hiatal hernia, dysphagia, and GERD .  Has had prior cardiac CTA with calcium score to and minimal atherosclerosis less than 25%.  Underwent Nissen fundoplication and postoperatively had pleuritic upper chest and shoulder discomfort.  Assessment & Plan    1. Upper chest and neck pain representing postoperative pain: Improved since yesterday.   No evidence that this discomfort is cardiac related.  Plan analgesia as tolerated by the patient.   [facility] will sign off.   Medication Recommendations: Analgesia as tolerated Other recommendations (labs, testing, etc): No further cardiac evaluation Follow up as an outpatient: As previously scheduled in the device clinic  For questions or updates, please contact [place] Please consult [email or web address] for contact info under        Signed, Sinclair Grooms, MD  03/02/2020, 8:54 AM

## 2020-03-02 NOTE — Progress Notes (Signed)
1 Day Post-Op   Subjective/Chief Complaint: Doing well No reflux Pain better   Objective: Vital signs in last 24 hours: Temp:  [97.1 F (36.2 C)-98.2 F (36.8 C)] 98.2 F (36.8 C) (10/20 0540) Pulse Rate:  [59-87] 64 (10/20 0540) Resp:  [9-31] 17 (10/20 0540) BP: (94-183)/(63-84) 130/65 (10/20 0540) SpO2:  [92 %-100 %] 95 % (10/20 0540) Weight:  [70.1 kg] 70.1 kg (10/19 0955) Last BM Date: 03/01/20  Intake/Output from previous day: 10/19 0701 - 10/20 0700 In: 1941.2 [I.V.:1941.2] Out: 255 [Urine:250; Blood:5] Intake/Output this shift: No intake/output data recorded.  General appearance: alert and cooperative GI: soft, non-tender; bowel sounds normal; no masses,  no organomegaly and inc c/d/i Anti-infectives: Anti-infectives (From admission, onward)   Start     Dose/Rate Route Frequency Ordered Stop   03/01/20 1045  vancomycin (VANCOCIN) IVPB 1000 mg/200 mL premix        1,000 mg 200 mL/hr over 60 Minutes Intravenous On call to O.R. 03/01/20 1030 03/01/20 1144      Assessment/Plan: s/p Procedure(s): XI ROBOTIC ASSISTED HIATAL HERNIA REPAIR AND PARTIAL NISSEN FUNDOPLICATION (N/A) await esophagram  If Ok will start clears today Home later today if pain controlled and ambulating well  LOS: 1 day    Ralene Ok 03/02/2020

## 2020-03-02 NOTE — Plan of Care (Signed)
Nutrition Education Note  RD consulted for nutrition education regarding pureed diet s/p Nissen.   Spoke with pt and husband at bedside. Pt reports she is waiting in her clear liquid lunch tray, but has tolerated ice chips well. Pt familiar with pureed diet and reports she prepared PTA but thinking abut what she could shop for. She has a food processor and blender at her house.   RD provided "National Dysphagia Diet Level 1: Pureed Nutrition Therapy" handout from the Academy of Nutrition and Dietetics. Reviewed patient's dietary recall. Provided examples on ways to modify foods to be of pureed/liquid consistency. Discouraged intake of solid or hard textured foods.  Discussed ways that pt could utilize a blender or food processor to achieve desired consistency. Encouraged increased protein intake and oral nutrition supplements/ use of protein powders added to foods, soups, and smoothies.  RD discussed why it is important for patient to adhere to diet recommendations. Discussed plan to continue with current diet until wires can be removed. Teach back method used.  Expect good compliance.  Current diet order is clear liquid, patient is consuming approximately n/a% of meals at this time. Labs and medications reviewed. No further nutrition interventions warranted at this time. RD contact information provided. If additional nutrition issues arise, please re-consult RD.   Loistine Chance, RD, LDN, [place] Registered Dietitian II Certified Diabetes Care and Education Specialist Please refer to [facility] for RD and/or RD on-call/weekend/after hours pager

## 2020-03-07 ENCOUNTER — Encounter (HOSPITAL_COMMUNITY): Payer: Self-pay | Admitting: General Surgery

## 2020-03-07 ENCOUNTER — Ambulatory Visit (INDEPENDENT_AMBULATORY_CARE_PROVIDER_SITE_OTHER): Payer: Medicare Other

## 2020-03-07 DIAGNOSIS — I442 Atrioventricular block, complete: Secondary | ICD-10-CM | POA: Diagnosis not present

## 2020-03-08 LAB — CUP PACEART REMOTE DEVICE CHECK
Battery Remaining Longevity: 34 mo
Battery Voltage: 2.95 V
Brady Statistic AP VP Percent: 29.67 %
Brady Statistic AP VS Percent: 0 %
Brady Statistic AS VP Percent: 70.29 %
Brady Statistic AS VS Percent: 0.04 %
Brady Statistic RA Percent Paced: 29.66 %
Brady Statistic RV Percent Paced: 99.95 %
Date Time Interrogation Session: 20211025084640
Implantable Lead Implant Date: 20140818
Implantable Lead Implant Date: 20140818
Implantable Lead Location: 753859
Implantable Lead Location: 753860
Implantable Lead Model: 5076
Implantable Lead Model: 5076
Implantable Pulse Generator Implant Date: 20140818
Lead Channel Impedance Value: 342 Ohm
Lead Channel Impedance Value: 456 Ohm
Lead Channel Impedance Value: 532 Ohm
Lead Channel Impedance Value: 570 Ohm
Lead Channel Pacing Threshold Amplitude: 0.625 V
Lead Channel Pacing Threshold Amplitude: 1.125 V
Lead Channel Pacing Threshold Pulse Width: 0.4 ms
Lead Channel Pacing Threshold Pulse Width: 0.4 ms
Lead Channel Sensing Intrinsic Amplitude: 1.5 mV
Lead Channel Sensing Intrinsic Amplitude: 1.5 mV
Lead Channel Sensing Intrinsic Amplitude: 26.75 mV
Lead Channel Sensing Intrinsic Amplitude: 26.75 mV
Lead Channel Setting Pacing Amplitude: 2.25 V
Lead Channel Setting Pacing Amplitude: 2.5 V
Lead Channel Setting Pacing Pulse Width: 0.4 ms
Lead Channel Setting Sensing Sensitivity: 0.9 mV

## 2020-03-10 NOTE — Progress Notes (Signed)
Remote pacemaker transmission.   

## 2020-04-01 ENCOUNTER — Telehealth: Payer: Self-pay | Admitting: Internal Medicine

## 2020-04-01 NOTE — Telephone Encounter (Signed)
Pt c/o BP issue: STAT if pt c/o blurred vision, one-sided weakness or slurred speech  1. What are your last 5 BP readings?  04/01/20 11:00 am:88/63 HR 82 96/70 90/68 91/76  85/66  2. Are you having any other symptoms (ex. Dizziness, headache, blurred vision, passed out)? Yes. Headache, blurry vision upon standing, pt feels like she is going to faint  3. What is your BP issue? Pt is concerned because her BP is low   STAT if patient feels like he/she is going to faint   1) Are you dizzy now? yes  2) Do you feel faint or have you passed out? Pt has not passed out   3) Do you have any other symptoms? Low BP   4) Have you checked your HR and BP (record if available)? See above

## 2020-04-01 NOTE — Telephone Encounter (Signed)
Left message for patient to call back  

## 2020-04-04 ENCOUNTER — Telehealth: Payer: Self-pay

## 2020-04-04 DIAGNOSIS — Z79899 Other long term (current) drug therapy: Secondary | ICD-10-CM

## 2020-04-04 DIAGNOSIS — I951 Orthostatic hypotension: Secondary | ICD-10-CM

## 2020-04-04 NOTE — Telephone Encounter (Signed)
Dr. Caryl Comes called and states patient notified him her heart rate was in the 180's. Request manual transmission.  Patient called to assist with transmission. Dr. Caryl Comes made aware.

## 2020-04-04 NOTE — Telephone Encounter (Signed)
Addressed through [place].  See Encounter.  Message has been sent to Dr Caryl Comes for review and recommendation.

## 2020-04-04 NOTE — Telephone Encounter (Signed)
Called patient and arranged for transmission as HR 188 would not be expected with dysautonomia >> sinus @ 80s   On followup call she realized it was a typo  She is recently s/p hiatal hernia repair, something which we had discussed as potentially aggravating her dysautonomia  Will begin fludrocortisone 0./1 mg bid as she has increased her proamatine salt and water and compression without benefit

## 2020-04-05 MED ORDER — FLUDROCORTISONE ACETATE 0.1 MG PO TABS
0.1000 mg | ORAL_TABLET | Freq: Two times a day (BID) | ORAL | 0 refills | Status: DC
Start: 2020-04-05 — End: 2020-06-08

## 2020-04-05 MED ORDER — FLUDROCORTISONE ACETATE 0.1 MG PO TABS: 0.1000 mg | ORAL_TABLET | Freq: Two times a day (BID) | ORAL | 3 refills | Status: DC

## 2020-04-19 ENCOUNTER — Other Ambulatory Visit: Payer: Medicare Other | Admitting: *Deleted

## 2020-04-19 ENCOUNTER — Other Ambulatory Visit: Payer: Self-pay

## 2020-04-19 DIAGNOSIS — Z79899 Other long term (current) drug therapy: Secondary | ICD-10-CM | POA: Diagnosis not present

## 2020-04-19 DIAGNOSIS — I951 Orthostatic hypotension: Secondary | ICD-10-CM | POA: Diagnosis not present

## 2020-04-19 LAB — BASIC METABOLIC PANEL
BUN/Creatinine Ratio: 18 (ref 12–28)
BUN: 12 mg/dL (ref 8–27)
CO2: 25 mmol/L (ref 20–29)
Calcium: 9.1 mg/dL (ref 8.7–10.3)
Chloride: 101 mmol/L (ref 96–106)
Creatinine, Ser: 0.66 mg/dL (ref 0.57–1.00)
GFR calc Af Amer: 100 mL/min/{1.73_m2} (ref >59)
GFR calc non Af Amer: 87 mL/min/{1.73_m2} (ref >59)
Glucose: 81 mg/dL (ref 65–99)
Potassium: 4 mmol/L (ref 3.5–5.2)
Sodium: 140 mmol/L (ref 134–144)

## 2020-05-30 ENCOUNTER — Other Ambulatory Visit: Payer: Self-pay | Admitting: Internal Medicine

## 2020-06-06 ENCOUNTER — Ambulatory Visit (INDEPENDENT_AMBULATORY_CARE_PROVIDER_SITE_OTHER): Payer: Medicare Other

## 2020-06-06 DIAGNOSIS — I442 Atrioventricular block, complete: Secondary | ICD-10-CM

## 2020-06-08 ENCOUNTER — Other Ambulatory Visit: Payer: Self-pay

## 2020-06-08 DIAGNOSIS — W009XXA Unspecified fall due to ice and snow, initial encounter: Secondary | ICD-10-CM | POA: Diagnosis not present

## 2020-06-08 DIAGNOSIS — K219 Gastro-esophageal reflux disease without esophagitis: Secondary | ICD-10-CM | POA: Diagnosis not present

## 2020-06-08 DIAGNOSIS — M25571 Pain in right ankle and joints of right foot: Secondary | ICD-10-CM | POA: Diagnosis not present

## 2020-06-08 LAB — CUP PACEART REMOTE DEVICE CHECK
Battery Remaining Longevity: 31 mo
Battery Voltage: 2.94 V
Brady Statistic AP VP Percent: 12.34 %
Brady Statistic AP VS Percent: 0 %
Brady Statistic AS VP Percent: 87.64 %
Brady Statistic AS VS Percent: 0.02 %
Brady Statistic RA Percent Paced: 12.34 %
Brady Statistic RV Percent Paced: 99.98 %
Date Time Interrogation Session: 20220125132504
Implantable Lead Implant Date: 20140818
Implantable Lead Implant Date: 20140818
Implantable Lead Location: 753859
Implantable Lead Location: 753860
Implantable Lead Model: 5076
Implantable Lead Model: 5076
Implantable Pulse Generator Implant Date: 20140818
Lead Channel Impedance Value: 323 Ohm
Lead Channel Impedance Value: 456 Ohm
Lead Channel Impedance Value: 570 Ohm
Lead Channel Impedance Value: 608 Ohm
Lead Channel Pacing Threshold Amplitude: 0.5 V
Lead Channel Pacing Threshold Amplitude: 1.125 V
Lead Channel Pacing Threshold Pulse Width: 0.4 ms
Lead Channel Pacing Threshold Pulse Width: 0.4 ms
Lead Channel Sensing Intrinsic Amplitude: 2 mV
Lead Channel Sensing Intrinsic Amplitude: 2 mV
Lead Channel Sensing Intrinsic Amplitude: 23.375 mV
Lead Channel Sensing Intrinsic Amplitude: 23.375 mV
Lead Channel Setting Pacing Amplitude: 2.25 V
Lead Channel Setting Pacing Amplitude: 2.5 V
Lead Channel Setting Pacing Pulse Width: 0.4 ms
Lead Channel Setting Sensing Sensitivity: 0.9 mV

## 2020-06-08 MED ORDER — FLUDROCORTISONE ACETATE 0.1 MG PO TABS: 0.1000 mg | ORAL_TABLET | Freq: Two times a day (BID) | ORAL | 2 refills | Status: DC

## 2020-06-16 NOTE — Progress Notes (Signed)
Remote pacemaker transmission.   

## 2020-06-21 DIAGNOSIS — Z1231 Encounter for screening mammogram for malignant neoplasm of breast: Secondary | ICD-10-CM | POA: Diagnosis not present

## 2020-08-26 DIAGNOSIS — Z23 Encounter for immunization: Secondary | ICD-10-CM | POA: Diagnosis not present

## 2020-09-05 ENCOUNTER — Ambulatory Visit (INDEPENDENT_AMBULATORY_CARE_PROVIDER_SITE_OTHER): Payer: Medicare Other

## 2020-09-05 DIAGNOSIS — I442 Atrioventricular block, complete: Secondary | ICD-10-CM

## 2020-09-07 LAB — CUP PACEART REMOTE DEVICE CHECK
Battery Remaining Longevity: 28 mo
Battery Voltage: 2.94 V
Brady Statistic AP VP Percent: 12.17 %
Brady Statistic AP VS Percent: 0 %
Brady Statistic AS VP Percent: 87.81 %
Brady Statistic AS VS Percent: 0.02 %
Brady Statistic RA Percent Paced: 12.17 %
Brady Statistic RV Percent Paced: 99.98 %
Date Time Interrogation Session: 20220426092953
Implantable Lead Implant Date: 20140818
Implantable Lead Implant Date: 20140818
Implantable Lead Location: 753859
Implantable Lead Location: 753860
Implantable Lead Model: 5076
Implantable Lead Model: 5076
Implantable Pulse Generator Implant Date: 20140818
Lead Channel Impedance Value: 342 Ohm
Lead Channel Impedance Value: 456 Ohm
Lead Channel Impedance Value: 665 Ohm
Lead Channel Impedance Value: 684 Ohm
Lead Channel Pacing Threshold Amplitude: 0.5 V
Lead Channel Pacing Threshold Amplitude: 1 V
Lead Channel Pacing Threshold Pulse Width: 0.4 ms
Lead Channel Pacing Threshold Pulse Width: 0.4 ms
Lead Channel Sensing Intrinsic Amplitude: 16.625 mV
Lead Channel Sensing Intrinsic Amplitude: 16.625 mV
Lead Channel Sensing Intrinsic Amplitude: 2.75 mV
Lead Channel Sensing Intrinsic Amplitude: 2.75 mV
Lead Channel Setting Pacing Amplitude: 2.25 V
Lead Channel Setting Pacing Amplitude: 2.5 V
Lead Channel Setting Pacing Pulse Width: 0.4 ms
Lead Channel Setting Sensing Sensitivity: 0.9 mV

## 2020-09-16 DIAGNOSIS — N3010 Interstitial cystitis (chronic) without hematuria: Secondary | ICD-10-CM | POA: Diagnosis not present

## 2020-09-16 DIAGNOSIS — Z779 Other contact with and (suspected) exposures hazardous to health: Secondary | ICD-10-CM | POA: Diagnosis not present

## 2020-09-16 DIAGNOSIS — Z01419 Encounter for gynecological examination (general) (routine) without abnormal findings: Secondary | ICD-10-CM | POA: Diagnosis not present

## 2020-09-16 DIAGNOSIS — Z6825 Body mass index (BMI) 25.0-25.9, adult: Secondary | ICD-10-CM | POA: Diagnosis not present

## 2020-09-16 DIAGNOSIS — N952 Postmenopausal atrophic vaginitis: Secondary | ICD-10-CM | POA: Diagnosis not present

## 2020-09-16 DIAGNOSIS — Z01411 Encounter for gynecological examination (general) (routine) with abnormal findings: Secondary | ICD-10-CM | POA: Diagnosis not present

## 2020-09-16 DIAGNOSIS — Z124 Encounter for screening for malignant neoplasm of cervix: Secondary | ICD-10-CM | POA: Diagnosis not present

## 2020-09-16 DIAGNOSIS — Z87898 Personal history of other specified conditions: Secondary | ICD-10-CM | POA: Diagnosis not present

## 2020-09-19 DIAGNOSIS — N6489 Other specified disorders of breast: Secondary | ICD-10-CM | POA: Diagnosis not present

## 2020-09-22 NOTE — Progress Notes (Signed)
Remote pacemaker transmission.   

## 2020-11-10 DIAGNOSIS — G459 Transient cerebral ischemic attack, unspecified: Secondary | ICD-10-CM | POA: Diagnosis not present

## 2020-11-10 DIAGNOSIS — I639 Cerebral infarction, unspecified: Secondary | ICD-10-CM | POA: Diagnosis not present

## 2020-11-10 DIAGNOSIS — Z950 Presence of cardiac pacemaker: Secondary | ICD-10-CM | POA: Diagnosis not present

## 2020-11-10 DIAGNOSIS — R42 Dizziness and giddiness: Secondary | ICD-10-CM | POA: Diagnosis not present

## 2020-11-10 DIAGNOSIS — R4701 Aphasia: Secondary | ICD-10-CM | POA: Diagnosis not present

## 2020-11-10 DIAGNOSIS — I6782 Cerebral ischemia: Secondary | ICD-10-CM | POA: Diagnosis not present

## 2020-12-05 ENCOUNTER — Ambulatory Visit (INDEPENDENT_AMBULATORY_CARE_PROVIDER_SITE_OTHER): Payer: Medicare Other

## 2020-12-05 DIAGNOSIS — I442 Atrioventricular block, complete: Secondary | ICD-10-CM

## 2020-12-07 DIAGNOSIS — H5212 Myopia, left eye: Secondary | ICD-10-CM | POA: Diagnosis not present

## 2020-12-07 DIAGNOSIS — E785 Hyperlipidemia, unspecified: Secondary | ICD-10-CM | POA: Diagnosis not present

## 2020-12-07 DIAGNOSIS — E039 Hypothyroidism, unspecified: Secondary | ICD-10-CM | POA: Diagnosis not present

## 2020-12-07 DIAGNOSIS — H04123 Dry eye syndrome of bilateral lacrimal glands: Secondary | ICD-10-CM | POA: Diagnosis not present

## 2020-12-07 DIAGNOSIS — Z961 Presence of intraocular lens: Secondary | ICD-10-CM | POA: Diagnosis not present

## 2020-12-07 LAB — CUP PACEART REMOTE DEVICE CHECK
Battery Remaining Longevity: 26 mo
Battery Voltage: 2.93 V
Brady Statistic AP VP Percent: 18.19 %
Brady Statistic AP VS Percent: 0 %
Brady Statistic AS VP Percent: 81.79 %
Brady Statistic AS VS Percent: 0.02 %
Brady Statistic RA Percent Paced: 18.19 %
Brady Statistic RV Percent Paced: 99.98 %
Date Time Interrogation Session: 20220725084721
Implantable Lead Implant Date: 20140818
Implantable Lead Implant Date: 20140818
Implantable Lead Location: 753859
Implantable Lead Location: 753860
Implantable Lead Model: 5076
Implantable Lead Model: 5076
Implantable Pulse Generator Implant Date: 20140818
Lead Channel Impedance Value: 323 Ohm
Lead Channel Impedance Value: 456 Ohm
Lead Channel Impedance Value: 513 Ohm
Lead Channel Impedance Value: 551 Ohm
Lead Channel Pacing Threshold Amplitude: 0.75 V
Lead Channel Pacing Threshold Amplitude: 1 V
Lead Channel Pacing Threshold Pulse Width: 0.4 ms
Lead Channel Pacing Threshold Pulse Width: 0.4 ms
Lead Channel Sensing Intrinsic Amplitude: 0.875 mV
Lead Channel Sensing Intrinsic Amplitude: 0.875 mV
Lead Channel Sensing Intrinsic Amplitude: 17.125 mV
Lead Channel Sensing Intrinsic Amplitude: 17.125 mV
Lead Channel Setting Pacing Amplitude: 2 V
Lead Channel Setting Pacing Amplitude: 2.5 V
Lead Channel Setting Pacing Pulse Width: 0.4 ms
Lead Channel Setting Sensing Sensitivity: 0.9 mV

## 2020-12-09 DIAGNOSIS — Z780 Asymptomatic menopausal state: Secondary | ICD-10-CM | POA: Diagnosis not present

## 2020-12-14 DIAGNOSIS — K219 Gastro-esophageal reflux disease without esophagitis: Secondary | ICD-10-CM | POA: Diagnosis not present

## 2020-12-14 DIAGNOSIS — G459 Transient cerebral ischemic attack, unspecified: Secondary | ICD-10-CM | POA: Diagnosis not present

## 2020-12-14 DIAGNOSIS — E785 Hyperlipidemia, unspecified: Secondary | ICD-10-CM | POA: Diagnosis not present

## 2020-12-14 DIAGNOSIS — I480 Paroxysmal atrial fibrillation: Secondary | ICD-10-CM | POA: Diagnosis not present

## 2020-12-14 DIAGNOSIS — E039 Hypothyroidism, unspecified: Secondary | ICD-10-CM | POA: Diagnosis not present

## 2020-12-14 DIAGNOSIS — K449 Diaphragmatic hernia without obstruction or gangrene: Secondary | ICD-10-CM | POA: Diagnosis not present

## 2020-12-14 DIAGNOSIS — Z0000 Encounter for general adult medical examination without abnormal findings: Secondary | ICD-10-CM | POA: Diagnosis not present

## 2020-12-15 ENCOUNTER — Other Ambulatory Visit: Payer: Self-pay | Admitting: Internal Medicine

## 2020-12-15 DIAGNOSIS — G459 Transient cerebral ischemic attack, unspecified: Secondary | ICD-10-CM

## 2020-12-15 DIAGNOSIS — Z1212 Encounter for screening for malignant neoplasm of rectum: Secondary | ICD-10-CM | POA: Diagnosis not present

## 2020-12-15 DIAGNOSIS — R82998 Other abnormal findings in urine: Secondary | ICD-10-CM | POA: Diagnosis not present

## 2020-12-16 ENCOUNTER — Other Ambulatory Visit (HOSPITAL_COMMUNITY): Payer: Self-pay | Admitting: Internal Medicine

## 2020-12-16 DIAGNOSIS — G459 Transient cerebral ischemic attack, unspecified: Secondary | ICD-10-CM

## 2020-12-19 ENCOUNTER — Ambulatory Visit (HOSPITAL_COMMUNITY)
Admission: RE | Admit: 2020-12-19 | Discharge: 2020-12-19 | Disposition: A | Discharge status: Home / Self Care | Payer: Medicare Other | Source: Ambulatory Visit | Attending: Internal Medicine | Admitting: Internal Medicine

## 2020-12-19 ENCOUNTER — Other Ambulatory Visit: Payer: Self-pay

## 2020-12-19 DIAGNOSIS — G459 Transient cerebral ischemic attack, unspecified: Secondary | ICD-10-CM | POA: Insufficient documentation

## 2020-12-26 ENCOUNTER — Ambulatory Visit
Admission: RE | Admit: 2020-12-26 | Discharge: 2020-12-26 | Disposition: A | Discharge status: Home / Self Care | Payer: Medicare Other | Source: Ambulatory Visit | Attending: Internal Medicine | Admitting: Internal Medicine

## 2020-12-26 ENCOUNTER — Other Ambulatory Visit: Payer: Self-pay | Admitting: Internal Medicine

## 2020-12-26 DIAGNOSIS — G459 Transient cerebral ischemic attack, unspecified: Secondary | ICD-10-CM | POA: Diagnosis not present

## 2020-12-26 MED ORDER — IOPAMIDOL (ISOVUE-300) INJECTION 61%
75.0000 mL | Freq: Once | INTRAVENOUS | Status: AC | PRN
  Administered 2020-12-26: 75 mL via INTRAVENOUS

## 2020-12-27 DIAGNOSIS — K52831 Collagenous colitis: Secondary | ICD-10-CM | POA: Diagnosis not present

## 2020-12-30 NOTE — Progress Notes (Signed)
Remote pacemaker transmission.   

## 2021-01-20 DIAGNOSIS — R300 Dysuria: Secondary | ICD-10-CM | POA: Diagnosis not present

## 2021-01-20 DIAGNOSIS — R5383 Other fatigue: Secondary | ICD-10-CM | POA: Diagnosis not present

## 2021-01-20 DIAGNOSIS — R110 Nausea: Secondary | ICD-10-CM | POA: Diagnosis not present

## 2021-02-09 DIAGNOSIS — M545 Low back pain, unspecified: Secondary | ICD-10-CM | POA: Diagnosis not present

## 2021-02-09 DIAGNOSIS — K529 Noninfective gastroenteritis and colitis, unspecified: Secondary | ICD-10-CM | POA: Diagnosis not present

## 2021-02-16 DIAGNOSIS — M545 Low back pain, unspecified: Secondary | ICD-10-CM | POA: Diagnosis not present

## 2021-02-20 ENCOUNTER — Other Ambulatory Visit: Payer: Self-pay | Admitting: Internal Medicine

## 2021-02-21 DIAGNOSIS — M545 Low back pain, unspecified: Secondary | ICD-10-CM | POA: Diagnosis not present

## 2021-02-23 DIAGNOSIS — M545 Low back pain, unspecified: Secondary | ICD-10-CM | POA: Diagnosis not present

## 2021-02-28 DIAGNOSIS — M545 Low back pain, unspecified: Secondary | ICD-10-CM | POA: Diagnosis not present

## 2021-03-02 DIAGNOSIS — M545 Low back pain, unspecified: Secondary | ICD-10-CM | POA: Diagnosis not present

## 2021-03-04 DIAGNOSIS — Z23 Encounter for immunization: Secondary | ICD-10-CM | POA: Diagnosis not present

## 2021-03-06 ENCOUNTER — Ambulatory Visit (INDEPENDENT_AMBULATORY_CARE_PROVIDER_SITE_OTHER): Payer: Medicare Other

## 2021-03-06 DIAGNOSIS — I442 Atrioventricular block, complete: Secondary | ICD-10-CM

## 2021-03-06 DIAGNOSIS — M545 Low back pain, unspecified: Secondary | ICD-10-CM | POA: Diagnosis not present

## 2021-03-07 LAB — CUP PACEART REMOTE DEVICE CHECK
Battery Remaining Longevity: 24 mo
Battery Voltage: 2.92 V
Brady Statistic AP VP Percent: 16.24 %
Brady Statistic AP VS Percent: 0 %
Brady Statistic AS VP Percent: 83.74 %
Brady Statistic AS VS Percent: 0.02 %
Brady Statistic RA Percent Paced: 16.24 %
Brady Statistic RV Percent Paced: 99.98 %
Date Time Interrogation Session: 20221024083829
Implantable Lead Implant Date: 20140818
Implantable Lead Implant Date: 20140818
Implantable Lead Location: 753859
Implantable Lead Location: 753860
Implantable Lead Model: 5076
Implantable Lead Model: 5076
Implantable Pulse Generator Implant Date: 20140818
Lead Channel Impedance Value: 323 Ohm
Lead Channel Impedance Value: 456 Ohm
Lead Channel Impedance Value: 532 Ohm
Lead Channel Impedance Value: 570 Ohm
Lead Channel Pacing Threshold Amplitude: 0.75 V
Lead Channel Pacing Threshold Amplitude: 1 V
Lead Channel Pacing Threshold Pulse Width: 0.4 ms
Lead Channel Pacing Threshold Pulse Width: 0.4 ms
Lead Channel Sensing Intrinsic Amplitude: 1.25 mV
Lead Channel Sensing Intrinsic Amplitude: 1.25 mV
Lead Channel Sensing Intrinsic Amplitude: 14.625 mV
Lead Channel Sensing Intrinsic Amplitude: 14.625 mV
Lead Channel Setting Pacing Amplitude: 2.25 V
Lead Channel Setting Pacing Amplitude: 2.5 V
Lead Channel Setting Pacing Pulse Width: 0.4 ms
Lead Channel Setting Sensing Sensitivity: 0.9 mV

## 2021-03-09 DIAGNOSIS — M545 Low back pain, unspecified: Secondary | ICD-10-CM | POA: Diagnosis not present

## 2021-03-14 DIAGNOSIS — M545 Low back pain, unspecified: Secondary | ICD-10-CM | POA: Diagnosis not present

## 2021-03-14 NOTE — Progress Notes (Signed)
Remote pacemaker transmission.   

## 2021-03-16 DIAGNOSIS — M545 Low back pain, unspecified: Secondary | ICD-10-CM | POA: Diagnosis not present

## 2021-03-21 DIAGNOSIS — M545 Low back pain, unspecified: Secondary | ICD-10-CM | POA: Diagnosis not present

## 2021-03-23 DIAGNOSIS — M545 Low back pain, unspecified: Secondary | ICD-10-CM | POA: Diagnosis not present

## 2021-03-27 DIAGNOSIS — M545 Low back pain, unspecified: Secondary | ICD-10-CM | POA: Diagnosis not present

## 2021-03-28 ENCOUNTER — Other Ambulatory Visit (HOSPITAL_COMMUNITY): Payer: Self-pay | Admitting: Internal Medicine

## 2021-03-28 ENCOUNTER — Ambulatory Visit (HOSPITAL_COMMUNITY)
Admission: RE | Admit: 2021-03-28 | Discharge: 2021-03-28 | Disposition: A | Discharge status: Home / Self Care | Payer: Medicare Other | Source: Ambulatory Visit | Attending: Cardiovascular Disease | Admitting: Cardiovascular Disease

## 2021-03-28 ENCOUNTER — Other Ambulatory Visit: Payer: Self-pay

## 2021-03-28 DIAGNOSIS — K529 Noninfective gastroenteritis and colitis, unspecified: Secondary | ICD-10-CM | POA: Diagnosis not present

## 2021-03-28 DIAGNOSIS — M7989 Other specified soft tissue disorders: Secondary | ICD-10-CM | POA: Insufficient documentation

## 2021-03-28 DIAGNOSIS — E785 Hyperlipidemia, unspecified: Secondary | ICD-10-CM | POA: Diagnosis not present

## 2021-03-28 DIAGNOSIS — I959 Hypotension, unspecified: Secondary | ICD-10-CM | POA: Diagnosis not present

## 2021-03-28 DIAGNOSIS — R0602 Shortness of breath: Secondary | ICD-10-CM | POA: Diagnosis not present

## 2021-03-28 DIAGNOSIS — M5416 Radiculopathy, lumbar region: Secondary | ICD-10-CM | POA: Diagnosis not present

## 2021-03-28 DIAGNOSIS — K219 Gastro-esophageal reflux disease without esophagitis: Secondary | ICD-10-CM | POA: Diagnosis not present

## 2021-03-30 DIAGNOSIS — M545 Low back pain, unspecified: Secondary | ICD-10-CM | POA: Diagnosis not present

## 2021-05-04 ENCOUNTER — Encounter: Payer: Self-pay | Admitting: Orthopaedic Surgery

## 2021-05-04 ENCOUNTER — Ambulatory Visit: Payer: Self-pay

## 2021-05-04 ENCOUNTER — Other Ambulatory Visit: Payer: Self-pay

## 2021-05-04 ENCOUNTER — Ambulatory Visit (INDEPENDENT_AMBULATORY_CARE_PROVIDER_SITE_OTHER): Payer: Medicare Other | Admitting: Orthopaedic Surgery

## 2021-05-04 DIAGNOSIS — M7062 Trochanteric bursitis, left hip: Secondary | ICD-10-CM | POA: Diagnosis not present

## 2021-05-04 DIAGNOSIS — M545 Low back pain, unspecified: Secondary | ICD-10-CM

## 2021-05-04 DIAGNOSIS — M25552 Pain in left hip: Secondary | ICD-10-CM

## 2021-05-04 MED ORDER — LIDOCAINE HCL 1 % IJ SOLN
2.0000 mL | INTRAMUSCULAR | Status: AC | PRN
  Administered 2021-05-04: 16:00:00 2 mL

## 2021-05-04 MED ORDER — BUPIVACAINE HCL 0.25 % IJ SOLN
2.0000 mL | INTRAMUSCULAR | Status: AC | PRN
  Administered 2021-05-04: 16:00:00 2 mL via INTRA_ARTICULAR

## 2021-05-04 NOTE — Progress Notes (Signed)
Office Visit Note   Patient: [name]           Date of Birth: 07-05-1945           MRN: [id number] Visit Date: 05/04/2021              Requested by: Burnard Bunting, MD [address] [place],  [place] [address] PCP: Burnard Bunting, MD   Assessment & Plan: Visit Diagnoses:  1. Low back pain, unspecified back pain laterality, unspecified chronicity, unspecified whether sciatica present   2. Pain in left hip   3. Trochanteric bursitis, left hip     Plan: [name] has been experiencing a combination of low back lateral left hip and groin pain for several months..  May have all started when she was doing some heavy lifting.  She was seen by her primary care physician and placed on prednisone which seemed to give her some temporary relief but for the past 6 weeks or so she has been having more discomfort that is more localized along the lateral aspect of the left hip.  She has had bilateral total hip replacements by Dr. Ninfa Linden and notes that they are doing well.  She does have an occasion to have some left groin pain.  She has not had any numbness or tingling but some pain along the lateral aspect of her hip in conjunction with the above.  There are some degenerative changes by films of her lumbar spine.  The total hip replacements appear to be in good position.  Her symptoms are consistent with greater trochanteric bursitis with tenderness directly over her hip.  I have elected to inject that area of tenderness with betamethasone.  She definitely felt better after the injection.  We will just monitor response and have her call let me know if she has any further problems.  We might want to consider working up her lumbar spine if she continues to have some pain  Follow-Up Instructions: Return if symptoms worsen or fail to improve.   Orders:  Orders Placed This Encounter  Procedures   XR Lumbar Spine 2-3 Views   XR HIP UNILAT W OR W/O PELVIS 2-3 VIEWS LEFT   No orders of  the defined types were placed in this encounter.     Procedures: Large Joint Inj: L greater trochanter on 05/04/2021 3:30 PM Indications: pain and diagnostic evaluation Details: 25 G 1.5 in needle, lateral approach  Arthrogram: No  Medications: 2 mL lidocaine 1 %; 2 mL bupivacaine 0.25 %  12 mg betamethasone injected with Xylocaine and Marcaine over the area of greatest tenderness left greater trochanter Procedure, treatment alternatives, risks and benefits explained, specific risks discussed. Consent was given by the patient. Immediately prior to procedure a time out was called to verify the correct patient, procedure, equipment, support staff and site/side marked as required. Patient was prepped and draped in the usual sterile fashion.      Clinical Data: No additional findings.   Subjective: Chief Complaint  Patient presents with   Lower Back - Pain   Left Hip - Pain  Presently having some discomfort but tickly in the area of her left hip laterally and pain with weightbearing.  Occasionally she has some groin pain.  She has had bilateral total hip replacements by Dr. Ninfa Linden 10 to 12 years ago.  She is not had any fever or chills.  Pain in her back hip and groin started several months ago after doing some heavy lifting  but seems to more localized along the lateral aspect of her left hip.  Has had some low back pain.  Primary care physician and had her on prednisone and started a course of physical therapy but still having some discomfort  HPI  Review of Systems   Objective: Vital Signs: There were no vitals taken for this visit.  Physical Exam Constitutional:      Appearance: She is well-developed.  Pulmonary:     Effort: Pulmonary effort is normal.  Skin:    General: Skin is warm and dry.  Neurological:     Mental Status: She is alert and oriented to person, place, and time.  Psychiatric:        Behavior: Behavior normal.    Ortho Exam awake alert and oriented  x3.  Comfortable sitting great leg raise is negative.  Painless range of motion of both hips without any loss of motion.  There was tenderness directly over the greater trochanter of her left hip.  Skin was intact.  Very minimal percussible tenderness lower lumbar spine and very minimal tenderness over the left parasacral region.  No specific pain at the SI joint.  Specialty Comments:  No specialty comments available.  Imaging: XR HIP UNILAT W OR W/O PELVIS 2-3 VIEWS LEFT  Result Date: 05/04/2021 AP pelvis and lateral of left hip were performed demonstrating intact bilateral hip replacements.  No obvious complications.  Femoral stents are well-seated and in good position.  No evidence of polyethylene wear.  Some irregularity along the greater trochanters which could predispose to partial gluteal tears or bursitis  XR Lumbar Spine 2-3 Views  Result Date: 05/04/2021 2 views lumbar spine were obtained in the AP and lateral projections.  There are degenerative changes at the facet joints at L4-5 and L5-S1.  No listhesis.  Slight degenerative curvature to the right.  The spaces appear to be well-maintained.  There are osteophytes identified anteriorly.  No obvious compression fracture.  Diffuse contrast within the intestine    PMFS History: Patient Active Problem List   Diagnosis Date Noted   Trochanteric bursitis, left hip 05/04/2021   S/P Nissen fundoplication (without gastrostomy tube) procedure 03/01/2020   Pleuritic chest pain    Heart block AV complete ([place]) 05/19/2019   Chronic right shoulder pain 02/13/2017   Short Achilles tendon (acquired), left ankle 02/13/2017   Dizziness and giddiness 71/21/9758   Monoallelic mutation of CHEK2 gene 02/02/2015   Genetic testing 01/31/2015   Altered mental status 10/25/2014   Occipital neuralgia of left side    Cerebral infarction ([place]) brainstem s/p IV tPA (not seen on MRI) 10/07/2014   History of permanent cardiac pacemaker placement  10/07/2014   Cephalalgia    Complicated migraine    HLD (hyperlipidemia)    Headache 10/06/2014   Arthritis of left hip 06/12/2013   Status post THR (total hip replacement) 06/12/2013   Constipation 05/25/2013   Orthostatic hypotension 01/27/2013   crosstalk-atrial lead 01/27/2013   Pre-syncope 01/02/2013   Sinus tachycardia 01/01/2013   Second degree heart block 12/27/2012   Hypothyroidism 12/27/2012   Spigelian hernia 04/20/2011   DYSPNEA 05/09/2010   BREAST CANCER 05/05/2010   ASTHMA 05/05/2010   HIATAL HERNIA 05/05/2010   DEGENERATIVE Disk DISEASE 05/05/2010   OSTEOPENIA 05/05/2010   Past Medical History:  Diagnosis Date   ADHD (attention deficit hyperactivity disorder)    Allergy    trees/pollen, mold, fungus, dust mites. Takes allergy shots   Arthritis    PAIN AND OA LEFT  HIP   Asthma    allergist Dr Olena Heckle- monthly allergy injections   Autonomic dysfunction    CENTRAL NERVOUS SYSTEM NEUROPATHY - DX BY DR. Erling Cruz MORE THAN 10 YRS AGO - AND IT IS FELT TO CONTRIBUTE TO THE AUTOMIC  DYSFUNCTION-- PT HAS NUMBNESS LEGS AND FEET AND SOMETIIMES TIPS OF FINGER, SEVERE CONSTIPATION( NO SENSATION TO HAVE BM ), DOUBLE VISION, ORTHOSTATIC HYPOTENSION   Blood transfusion    Breast cancer ([place])    right side   Bruises easily    Cataract    Chest pain    a. 12/2012 Cath: nl cors, EF 55-65%.   Complication of anesthesia    reaction to some anesthetics/ 7/12 anesth record on chart- states prefers epidural   CVA (cerebrovascular accident) ([place]) 03/21/2018   Depression    Diverticulosis    Dysrhythmia    HX OF HIGH GRADE HEART BLOCK - REQUIRED PACEMAKER INSERTION   Esophageal stricture    Gastritis    GERD (gastroesophageal reflux disease)    H/O hiatal hernia    Hemorrhoids    Hernia of abdominal wall    spigelian hernia RLQ - SURGERY TO REPAIR   Hypothyroidism    Interstitial cystitis    Latex allergy, contact dermatitis    Mallory - Weiss tear    HEALED    Neuromuscular  disorder (HCC)    central nervous system neuropathy- seen per Dr Erling Cruz   Pacemaker    Pericardial effusion    a. 12/2012 following ppm placement;  b. 01/01/2013 Echo: EF 55-60%, small pericardial effusion w/o RV collapse-->No need for tap/window.   PONV (postoperative nausea and vomiting)    pt needs scop patch   Recurrent upper respiratory infection (URI) 1/13- to present   bronchitis following surgery- states improved but still with cough. OV with Clearance Dr [name] 09/06/11 on chart   Shortness of breath    AT TIMES - BUT [place].   Symptomatic bradycardia    a. 12/2012 s/p MDT dual chamber PPM, ser # [id number]; b. 12/2012 post-op course complicated by pericardial effusinon req lead revision.   Thyroid disease     Family History  Problem Relation Age of Onset   Heart disease Father    Colon cancer Mother 51   Depression Mother    Pancreatic cancer Sister 61   Diverticulitis Sister    Uterine cancer Maternal Grandmother    Heart disease Brother    Heart disease Paternal Grandmother    Heart disease Paternal Grandfather    Throat cancer Maternal Uncle    Heart disease Paternal Aunt    Heart disease Paternal Uncle    Heart disease Brother    Thrombosis Maternal Aunt    Cancer Maternal Uncle        NOS   Diabetes Other        aunt    Past Surgical History:  Procedure Laterality Date   ABDOMINAL HYSTERECTOMY  1974   BACK SURGERY     cervical fusion 4-5 with plate   BLADDER SUSPENSION     BREAST BIOPSY  2002   NO BLOOD PRESSURES ON RIGHT SIDE/   s/p  axillary node dissection   CYSTO WITH HYDRODISTENSION  09/07/2011   Procedure: CYSTOSCOPY/HYDRODISTENSION;  Surgeon: Ailene Rud, MD;  Location: [facility];  Service: Urology;  Laterality: N/A;  INSTILLATION OF MARCAINE/PYRIDIUM INSTILLATION OF MARCAINE/KENALOG   CYSTOSCOPY  1975, 2007   EYE SURGERY     LASIK EYE SURGERY  BILATERAL   LAPAROSCOPY  1973   LEAD REVISION N/A 12/30/2012    Procedure: LEAD REVISION;  Surgeon: Evans Lance, MD;  Location: [facility] CATH LAB;  Service: Cardiovascular;  Laterality: N/A;   LEFT HEART CATHETERIZATION WITH CORONARY ANGIOGRAM N/A 12/29/2012   Procedure: LEFT HEART CATHETERIZATION WITH CORONARY ANGIOGRAM;  Surgeon: Caylon Saine M Martinique, MD;  Location: [facility] CATH LAB;  Service: Cardiovascular;  Laterality: N/A;   MASTECTOMY MODIFIED RADICAL     right; with immediate reconstruction   MYRINGOPLASTY  1962   NECK SURGERY     c4-5 ruptured disc   OOPHORECTOMY  1982   PACEMAKER INSERTION     PERMANENT PACEMAKER INSERTION N/A 12/29/2012   Procedure: PERMANENT PACEMAKER INSERTION;  Surgeon: Deboraha Sprang, MD;  Location: [facility] CATH LAB;  Service: Cardiovascular;  Laterality: N/A;   RECTOCELE REPAIR     with cystocele repair   TONSILLECTOMY     TOTAL HIP ARTHROPLASTY Right    TOTAL HIP ARTHROPLASTY Left 06/12/2013   Procedure: LEFT TOTAL HIP ARTHROPLASTY ANTERIOR APPROACH;  Surgeon: Mcarthur Rossetti, MD;  Location: [facility];  Service: Orthopedics;  Laterality: Left;   TYMPANOPLASTY  1973   right   ULNAR NERVE TRANSPOSITION Right    VENTRAL HERNIA REPAIR  05/30/2011   Procedure: HERNIA REPAIR VENTRAL ADULT;  Surgeon: Haywood Lasso, MD;  Location: [place];  Service: General;  Laterality: Right;  repair right spigelian hernia   Social History   Occupational History   Occupation: Retired    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  Chart creation errors have been  sought, but may not always  have been located. Such creation errors do not reflect on  the standard of medical care.

## 2021-05-11 ENCOUNTER — Encounter: Payer: Self-pay | Admitting: Orthopaedic Surgery

## 2021-05-11 ENCOUNTER — Other Ambulatory Visit: Payer: Self-pay

## 2021-05-11 DIAGNOSIS — M545 Low back pain, unspecified: Secondary | ICD-10-CM

## 2021-05-16 ENCOUNTER — Other Ambulatory Visit: Payer: Self-pay

## 2021-05-16 ENCOUNTER — Ambulatory Visit (INDEPENDENT_AMBULATORY_CARE_PROVIDER_SITE_OTHER): Payer: Medicare Other | Admitting: Student

## 2021-05-16 ENCOUNTER — Encounter: Payer: Self-pay | Admitting: Student

## 2021-05-16 ENCOUNTER — Other Ambulatory Visit: Payer: Self-pay | Admitting: Orthopaedic Surgery

## 2021-05-16 ENCOUNTER — Encounter: Payer: Medicare Other | Admitting: Internal Medicine

## 2021-05-16 VITALS — BP 130/82 | HR 67 | Ht 67.0 in | Wt 173.6 lb

## 2021-05-16 DIAGNOSIS — I442 Atrioventricular block, complete: Secondary | ICD-10-CM

## 2021-05-16 DIAGNOSIS — M545 Low back pain, unspecified: Secondary | ICD-10-CM

## 2021-05-16 DIAGNOSIS — Z950 Presence of cardiac pacemaker: Secondary | ICD-10-CM | POA: Diagnosis not present

## 2021-05-16 DIAGNOSIS — I951 Orthostatic hypotension: Secondary | ICD-10-CM

## 2021-05-16 LAB — CUP PACEART INCLINIC DEVICE CHECK
Battery Remaining Longevity: 22 mo
Battery Voltage: 2.92 V
Brady Statistic AP VP Percent: 16.08 %
Brady Statistic AP VS Percent: 0 %
Brady Statistic AS VP Percent: 83.88 %
Brady Statistic AS VS Percent: 0.04 %
Brady Statistic RA Percent Paced: 16.08 %
Brady Statistic RV Percent Paced: 99.94 %
Date Time Interrogation Session: 20230103115933
Implantable Lead Implant Date: 20140818
Implantable Lead Implant Date: 20140818
Implantable Lead Location: 753859
Implantable Lead Location: 753860
Implantable Lead Model: 5076
Implantable Lead Model: 5076
Implantable Pulse Generator Implant Date: 20140818
Lead Channel Impedance Value: 304 Ohm
Lead Channel Impedance Value: 456 Ohm
Lead Channel Impedance Value: 551 Ohm
Lead Channel Impedance Value: 589 Ohm
Lead Channel Pacing Threshold Amplitude: 0.875 V
Lead Channel Pacing Threshold Amplitude: 1.25 V
Lead Channel Pacing Threshold Pulse Width: 0.4 ms
Lead Channel Pacing Threshold Pulse Width: 0.4 ms
Lead Channel Sensing Intrinsic Amplitude: 1.125 mV
Lead Channel Sensing Intrinsic Amplitude: 1.25 mV
Lead Channel Sensing Intrinsic Amplitude: 7.875 mV
Lead Channel Sensing Intrinsic Amplitude: 7.875 mV
Lead Channel Setting Pacing Amplitude: 2.5 V
Lead Channel Setting Pacing Amplitude: 2.75 V
Lead Channel Setting Pacing Pulse Width: 0.4 ms
Lead Channel Setting Sensing Sensitivity: 0.9 mV

## 2021-05-16 MED ORDER — OXYCODONE-ACETAMINOPHEN 5-325 MG PO TABS: 1.0000 | ORAL_TABLET | Freq: Two times a day (BID) | ORAL | 0 refills | Status: DC | PRN

## 2021-05-16 NOTE — Progress Notes (Addendum)
Electrophysiology Office Note Date: 05/16/2021  ID:  [name], DOB July 30, 1945, MRN [id number]  PCP: Burnard Bunting, MD Primary Cardiologist: None Electrophysiologist: Virl Axe, MD   CC: Pacemaker follow-up  [name] is a 76 y.o. female seen today for Virl Axe, MD for routine electrophysiology followup.  Since last being seen in our clinic the patient reports doing OK from a cardiac perspective. She has had increasing difficulty with R sided hip pain, initially responsive to injection. Planning CT +/- MRI.  Her Autonomic symptoms are well controlled on florinef and compression hose. She does continue to have vestibular type balance issues.   Device History: Medtronic Dual Chamber PPM implanted 2014 for CHB  Past Medical History:  Diagnosis Date   ADHD (attention deficit hyperactivity disorder)    Allergy    trees/pollen, mold, fungus, dust mites. Takes allergy shots   Arthritis    PAIN AND OA LEFT HIP   Asthma    allergist Dr Olena Heckle- monthly allergy injections   Autonomic dysfunction    CENTRAL NERVOUS SYSTEM NEUROPATHY - DX BY DR. Erling Cruz MORE THAN 10 YRS AGO - AND IT IS FELT TO CONTRIBUTE TO THE AUTOMIC  DYSFUNCTION-- PT HAS NUMBNESS LEGS AND FEET AND SOMETIIMES TIPS OF FINGER, SEVERE CONSTIPATION( NO SENSATION TO HAVE BM ), DOUBLE VISION, ORTHOSTATIC HYPOTENSION   Blood transfusion    Breast cancer ([place])    right side   Bruises easily    Cataract    Chest pain    a. 12/2012 Cath: nl cors, EF 55-65%.   Complication of anesthesia    reaction to some anesthetics/ 7/12 anesth record on chart- states prefers epidural   CVA (cerebrovascular accident) ([place]) 03/21/2018   Depression    Diverticulosis    Dysrhythmia    HX OF HIGH GRADE HEART BLOCK - REQUIRED PACEMAKER INSERTION   Esophageal stricture    Gastritis    GERD (gastroesophageal reflux disease)    H/O hiatal hernia    Hemorrhoids    Hernia of abdominal wall    spigelian hernia RLQ -  SURGERY TO REPAIR   Hypothyroidism    Interstitial cystitis    Latex allergy, contact dermatitis    Mallory - Weiss tear    HEALED    Neuromuscular disorder (HCC)    central nervous system neuropathy- seen per Dr Erling Cruz   Pacemaker    Pericardial effusion    a. 12/2012 following ppm placement;  b. 01/01/2013 Echo: EF 55-60%, small pericardial effusion w/o RV collapse-->No need for tap/window.   PONV (postoperative nausea and vomiting)    pt needs scop patch   Recurrent upper respiratory infection (URI) 1/13- to present   bronchitis following surgery- states improved but still with cough. OV with Clearance Dr [name] 09/06/11 on chart   Shortness of breath    AT TIMES - BUT [place].   Symptomatic bradycardia    a. 12/2012 s/p MDT dual chamber PPM, ser # [id number]; b. 12/2012 post-op course complicated by pericardial effusinon req lead revision.   Thyroid disease    Past Surgical History:  Procedure Laterality Date   ABDOMINAL HYSTERECTOMY  1974   BACK SURGERY     cervical fusion 4-5 with plate   BLADDER SUSPENSION     BREAST BIOPSY  2002   NO BLOOD PRESSURES ON RIGHT SIDE/   s/p  axillary node dissection   CYSTO WITH HYDRODISTENSION  09/07/2011   Procedure: CYSTOSCOPY/HYDRODISTENSION;  Surgeon: Ailene Rud,  MD;  [facility];  Service: Urology;  Laterality: N/A;  INSTILLATION OF MARCAINE/PYRIDIUM INSTILLATION OF MARCAINE/KENALOG   CYSTOSCOPY  1975, 2007   EYE SURGERY     LASIK EYE SURGERY BILATERAL   LAPAROSCOPY  1973   LEAD REVISION N/A 12/30/2012   Procedure: LEAD REVISION;  Surgeon: Evans Lance, MD;  Location: [facility] CATH LAB;  Service: Cardiovascular;  Laterality: N/A;   LEFT HEART CATHETERIZATION WITH CORONARY ANGIOGRAM N/A 12/29/2012   Procedure: LEFT HEART CATHETERIZATION WITH CORONARY ANGIOGRAM;  Surgeon: Peter M Martinique, MD;  [facility];  Service: Cardiovascular;  Laterality: N/A;   MASTECTOMY MODIFIED RADICAL      right; with immediate reconstruction   MYRINGOPLASTY  1962   NECK SURGERY     c4-5 ruptured disc   OOPHORECTOMY  1982   PACEMAKER INSERTION     PERMANENT PACEMAKER INSERTION N/A 12/29/2012   Procedure: PERMANENT PACEMAKER INSERTION;  Surgeon: Deboraha Sprang, MD;  Location: [facility] CATH LAB;  Service: Cardiovascular;  Laterality: N/A;   RECTOCELE REPAIR     with cystocele repair   TONSILLECTOMY     TOTAL HIP ARTHROPLASTY Right    TOTAL HIP ARTHROPLASTY Left 06/12/2013   Procedure: LEFT TOTAL HIP ARTHROPLASTY ANTERIOR APPROACH;  Surgeon: Mcarthur Rossetti, MD;  [facility];  Service: Orthopedics;  Laterality: Left;   TYMPANOPLASTY  1973   right   ULNAR NERVE TRANSPOSITION Right    VENTRAL HERNIA REPAIR  05/30/2011   Procedure: HERNIA REPAIR VENTRAL ADULT;  Surgeon: Haywood Lasso, MD;  Location: [place];  Service: General;  Laterality: Right;  repair right spigelian hernia    Current Outpatient Medications  Medication Sig Dispense Refill   acetaminophen (TYLENOL) 325 MG tablet Take 325 mg by mouth every 4 (four) hours as needed for mild pain or moderate pain.      aspirin EC 81 MG EC tablet Take 1 tablet (81 mg total) by mouth daily.     bismuth subsalicylate (PEPTO BISMOL) 262 MG chewable tablet 2 tablets with meals and at bedtime     cetirizine (ZYRTEC) 10 MG tablet Take 10 mg by mouth daily.      cholecalciferol (VITAMIN D) 1000 UNITS tablet Take 1,000 Units by mouth daily.     cycloSPORINE (RESTASIS) 0.05 % ophthalmic emulsion      escitalopram (LEXAPRO) 10 MG tablet Take 10 mg by mouth daily.     Esomeprazole Magnesium (NEXIUM PO) Take 1 capsule by mouth daily.     estradiol (ESTRING) 2 MG vaginal ring Place 2 mg vaginally every 3 (three) months. follow package directions     fludrocortisone (FLORINEF) 0.1 MG tablet TAKE 1 TABLET TWICE A DAY (Patient taking differently: daily.) 180 tablet 0   levothyroxine (SYNTHROID) 88 MCG tablet Take 88 mcg by mouth  daily.     Magnesium 500 MG CAPS Take 250 mg by mouth daily.      meclizine (ANTIVERT) 25 MG tablet Take 25 mg by mouth 3 (three) times daily as needed for dizziness.      Melatonin 1 MG TABS Take 1 tablet by mouth at bedtime.      ondansetron (ZOFRAN) 4 MG tablet Take 4 mg by mouth every 8 (eight) hours as needed for nausea.      oxyCODONE-acetaminophen (PERCOCET/ROXICET) 5-325 MG tablet Take 1 tablet by mouth every 12 (twelve) hours as needed for severe pain. 30 tablet 0   potassium chloride (KLOR-CON) 10 MEQ tablet TAKE 1 TABLET  DAILY 90 tablet 3   Probiotic Product (ALIGN) 4 MG CAPS Take 4 mg by mouth at bedtime.      rosuvastatin (CRESTOR) 10 MG tablet Take 1 tablet (10 mg total) by mouth daily. (Patient taking differently: Take 10 mg by mouth at bedtime.) 90 tablet 0   vitamin B-12 (CYANOCOBALAMIN) 1000 MCG tablet Take 1,000 mcg by mouth daily.     albuterol (PROVENTIL HFA;VENTOLIN HFA) 108 (90 Base) MCG/ACT inhaler Inhale 2 puffs into the lungs every 6 (six) hours as needed. For shortness of breath (Patient taking differently: Inhale 2 puffs into the lungs every 6 (six) hours as needed for wheezing or shortness of breath. ) 1 Inhaler 1   Artificial Tear Ointment (DRY EYES OP) Place 1 drop into both eyes daily as needed (dry eye).     budesonide (ENTOCORT EC) 3 MG 24 hr capsule Take 3 mg by mouth daily.  (Patient not taking: Reported on 05/16/2021)     midodrine (PROAMATINE) 10 MG tablet Take 1 tablet (10 mg total) by mouth 3 (three) times daily. Take last dose prior to 6pm. 270 tablet 3   Pentosan Polysulfate Sodium (ELMIRON PO) Take 1 capsule by mouth 2 (two) times daily as needed (cystitis).      RABEprazole (ACIPHEX) 20 MG tablet TAKE 1 TABLET DAILY (Patient taking differently: Take 20 mg by mouth 2 (two) times daily. ) 30 tablet 0   No current facility-administered medications for this visit.   Facility-Administered Medications Ordered in Other Visits  Medication Dose Route Frequency  Provider Last Rate Last Admin   bupivacaine (MARCAINE) 0.5 % 10 mL, triamcinolone acetonide (KENALOG-40) 40 mg injection   Subcutaneous Once Carolan Clines, MD       bupivacaine (MARCAINE) 0.5 % 15 mL, phenazopyridine (PYRIDIUM) 400 mg bladder mixture   Bladder Instillation Once Carolan Clines, MD        Allergies:   Anesthetics, amide; Clindamycin; Neomycin; Shellfish allergy; Sulfonamide derivatives; Zolpidem tartrate; Azithromycin; Bactroban; Ciprofloxacin; Codeine; Meperidine hcl; Morphine; Neosporin [neomycin-polymyxin-gramicidin]; Nitrofurantoin; Other; Penicillins; Tramadol; and Latex   Social History: Social History   Socioeconomic History   Marital status: Married    Spouse name: Not on file   Number of children: 2   Years of education: 15   Highest education level: Not on file  Occupational History   Occupation: Retired    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   Patient is right handed.   Social Determinants of Health   Financial Resource Strain: Not on file  Food Insecurity: Not on file  Transportation Needs: Not on file  Physical Activity: Not on file  Stress: Not on file  Social Connections: Not on file  Intimate Partner Violence: Not on file    Family History: Family History  Problem Relation Age of Onset   Heart disease Father  Colon cancer Mother 38   Depression Mother    Pancreatic cancer Sister 69   Diverticulitis Sister     Uterine cancer Maternal Grandmother    Heart disease Brother    Heart disease Paternal Grandmother    Heart disease Paternal Grandfather    Throat cancer Maternal Uncle    Heart disease Paternal Aunt    Heart disease Paternal Uncle    Heart disease Brother    Thrombosis Maternal Aunt    Cancer Maternal Uncle        NOS   Diabetes Other        aunt     Review of Systems: All other systems reviewed and are otherwise negative except as noted above.  Physical Exam: Vitals:   05/16/21 1124  BP: 130/82  Pulse: 67  SpO2: 98%  Weight: 173 lb 9.6 oz (78.7 kg)  Height: 5\' 7"  (1.702 m)     GEN- The patient is well appearing, alert and oriented x 3 today.   HEENT: normocephalic, atraumatic; sclera clear, conjunctiva pink; hearing intact; oropharynx clear; neck supple  Lungs- Clear to ausculation bilaterally, normal work of breathing.  No wheezes, rales, rhonchi Heart- Regular rate and rhythm, no murmurs, rubs or gallops  GI- soft, non-tender, non-distended, bowel sounds present  Extremities- no clubbing or cyanosis. No edema MS- no significant deformity or atrophy Skin- warm and dry, no rash or lesion; PPM pocket well healed Psych- euthymic mood, full affect Neuro- strength and sensation are intact  PPM Interrogation- reviewed in detail today,  See PACEART report  EKG:  EKG is ordered today. Personal review shows V pacing at 67 bpm.  Recent Labs: No results found for requested labs within last 8760 hours.   Wt Readings from Last 3 Encounters:  05/16/21 173 lb 9.6 oz (78.7 kg)  03/01/20 154 lb 8 oz (70.1 kg)  02/24/20 156 lb 11.2 oz (71.1 kg)     Other studies Reviewed: Additional studies/ records that were reviewed today include: Previous EP office notes, Previous remote checks, Most recent labwork.   Assessment and Plan:  Complete heart block   Pacemaker-Medtronic    Exercise associated hypotension/Dysautonomia   Central polyneuropathy   Dyspnea on exertion    Hiatal hernia  Low back pain / Left hip Bursitis  Normal PPM function See Pace Art report No changes today  Following with Ortho for LBP/Left hip bursitis. Recently received injection with improvement.  Her device is MRI compatible   Dysautonomic type symptoms well controlled on florinef and compression hose. No concerns from this currently.   Current medicines are reviewed at length with the patient today.    Disposition:   Follow up with Dr. Caryl Comes in 12 months    Signed, Shirley Friar, PA-C  05/16/2021 12:05 PM  [facility] [address] [place] [address] [phone] (office) [phone] (fax)

## 2021-05-16 NOTE — Patient Instructions (Signed)
Medication Instructions:  Your physician recommends that you continue on your current medications as directed. Please refer to the Current Medication list given to you today.  *If you need a refill on your cardiac medications before your next appointment, please call your pharmacy*   Lab Work: None If you have labs (blood work) drawn today and your tests are completely normal, you will receive your results only by: [place] (if you have MyChart) OR A paper copy in the mail If you have any lab test that is abnormal or we need to change your treatment, we will call you to review the results.   Follow-Up: At [facility], you and your health needs are our priority.  As part of our continuing mission to provide you with exceptional heart care, we have created designated Provider Care Teams.  These Care Teams include your primary Cardiologist (physician) and Advanced Practice Providers (APPs -  Physician Assistants and Nurse Practitioners) who all work together to provide you with the care you need, when you need it.  Your next appointment:   1 year(s)  The format for your next appointment:   In Person  Provider:   You may see Virl Axe, MD or one of the following Advanced Practice Providers on your designated Care Team:   Tommye Standard, [place] [facility]" [place], [place]

## 2021-05-18 ENCOUNTER — Other Ambulatory Visit: Payer: Self-pay

## 2021-05-18 ENCOUNTER — Ambulatory Visit (HOSPITAL_COMMUNITY)
Admission: RE | Admit: 2021-05-18 | Discharge: 2021-05-18 | Disposition: A | Discharge status: Home / Self Care | Payer: Medicare Other | Source: Ambulatory Visit | Attending: Orthopaedic Surgery | Admitting: Orthopaedic Surgery

## 2021-05-18 DIAGNOSIS — M545 Low back pain, unspecified: Secondary | ICD-10-CM

## 2021-05-18 NOTE — Progress Notes (Signed)
Please schedule ESI L-S spine- discussed with patientr

## 2021-05-19 ENCOUNTER — Other Ambulatory Visit: Payer: Self-pay | Admitting: Internal Medicine

## 2021-05-23 ENCOUNTER — Other Ambulatory Visit: Payer: Self-pay

## 2021-05-23 ENCOUNTER — Ambulatory Visit
Admission: RE | Admit: 2021-05-23 | Discharge: 2021-05-23 | Disposition: A | Discharge status: Home / Self Care | Payer: Medicare Other | Source: Ambulatory Visit | Attending: Orthopaedic Surgery | Admitting: Orthopaedic Surgery

## 2021-05-23 DIAGNOSIS — M47817 Spondylosis without myelopathy or radiculopathy, lumbosacral region: Secondary | ICD-10-CM | POA: Diagnosis not present

## 2021-05-23 DIAGNOSIS — M545 Low back pain, unspecified: Secondary | ICD-10-CM

## 2021-05-23 MED ORDER — IOPAMIDOL (ISOVUE-M 200) INJECTION 41%
1.0000 mL | Freq: Once | INTRAMUSCULAR | Status: AC
  Administered 2021-05-23: 1 mL via EPIDURAL

## 2021-05-23 MED ORDER — METHYLPREDNISOLONE ACETATE 40 MG/ML INJ SUSP (RADIOLOG
80.0000 mg | Freq: Once | INTRAMUSCULAR | Status: AC
  Administered 2021-05-23: 80 mg via EPIDURAL

## 2021-05-23 NOTE — Discharge Instructions (Signed)

## 2021-05-30 DIAGNOSIS — K52831 Collagenous colitis: Secondary | ICD-10-CM | POA: Diagnosis not present

## 2021-06-02 ENCOUNTER — Other Ambulatory Visit: Payer: Self-pay

## 2021-06-02 ENCOUNTER — Encounter: Payer: Self-pay | Admitting: Orthopaedic Surgery

## 2021-06-02 DIAGNOSIS — M545 Low back pain, unspecified: Secondary | ICD-10-CM

## 2021-06-09 ENCOUNTER — Telehealth: Payer: Self-pay | Admitting: Orthopaedic Surgery

## 2021-06-09 ENCOUNTER — Ambulatory Visit
Admission: RE | Admit: 2021-06-09 | Discharge: 2021-06-09 | Disposition: A | Discharge status: Home / Self Care | Payer: Medicare Other | Source: Ambulatory Visit | Attending: Orthopaedic Surgery | Admitting: Orthopaedic Surgery

## 2021-06-09 DIAGNOSIS — M545 Low back pain, unspecified: Secondary | ICD-10-CM

## 2021-06-09 DIAGNOSIS — M546 Pain in thoracic spine: Secondary | ICD-10-CM | POA: Diagnosis not present

## 2021-06-09 NOTE — Telephone Encounter (Signed)
thanks

## 2021-06-09 NOTE — Telephone Encounter (Signed)
When is study scheduled?

## 2021-06-09 NOTE — Telephone Encounter (Signed)
Called pt for follow up with sch'ing for an MRI follow up. Pt asked if possible to receive a call once the results are in as the next opening to come in was a few weeks out. The best call back number for the pt once the results come in are [phone].

## 2021-06-12 ENCOUNTER — Other Ambulatory Visit: Payer: Self-pay

## 2021-06-12 DIAGNOSIS — M545 Low back pain, unspecified: Secondary | ICD-10-CM

## 2021-06-12 NOTE — Progress Notes (Signed)
Discussed scan with Ms Royce- please schedule another spine injection with [facility] radiology and ask them to evaluate what type of injection would be best given the ESI did not help-thanks

## 2021-06-12 NOTE — Telephone Encounter (Signed)
Called and discussed results- see other note re referral to radiologist

## 2021-06-13 ENCOUNTER — Telehealth: Payer: Self-pay | Admitting: *Deleted

## 2021-06-13 NOTE — Telephone Encounter (Signed)
Faxed order to [name]. They will contact pt to schedule appt

## 2021-06-13 NOTE — Telephone Encounter (Signed)
-----   Message from Lendon Collar, RT sent at 06/12/2021  4:46 PM EST ----- Injection at [facility] ordered.

## 2021-06-19 ENCOUNTER — Ambulatory Visit
Admission: RE | Admit: 2021-06-19 | Discharge: 2021-06-19 | Disposition: A | Discharge status: Home / Self Care | Payer: Medicare Other | Source: Ambulatory Visit | Attending: Orthopaedic Surgery | Admitting: Orthopaedic Surgery

## 2021-06-19 ENCOUNTER — Other Ambulatory Visit: Payer: Self-pay | Admitting: Orthopaedic Surgery

## 2021-06-19 ENCOUNTER — Other Ambulatory Visit: Payer: Self-pay

## 2021-06-19 DIAGNOSIS — D692 Other nonthrombocytopenic purpura: Secondary | ICD-10-CM | POA: Diagnosis not present

## 2021-06-19 DIAGNOSIS — M47817 Spondylosis without myelopathy or radiculopathy, lumbosacral region: Secondary | ICD-10-CM | POA: Diagnosis not present

## 2021-06-19 DIAGNOSIS — K219 Gastro-esophageal reflux disease without esophagitis: Secondary | ICD-10-CM | POA: Diagnosis not present

## 2021-06-19 DIAGNOSIS — I480 Paroxysmal atrial fibrillation: Secondary | ICD-10-CM | POA: Diagnosis not present

## 2021-06-19 DIAGNOSIS — M545 Low back pain, unspecified: Secondary | ICD-10-CM

## 2021-06-19 DIAGNOSIS — R110 Nausea: Secondary | ICD-10-CM | POA: Diagnosis not present

## 2021-06-19 DIAGNOSIS — R0600 Dyspnea, unspecified: Secondary | ICD-10-CM | POA: Diagnosis not present

## 2021-06-19 DIAGNOSIS — R531 Weakness: Secondary | ICD-10-CM | POA: Diagnosis not present

## 2021-06-19 DIAGNOSIS — R5383 Other fatigue: Secondary | ICD-10-CM | POA: Diagnosis not present

## 2021-06-19 MED ORDER — IOPAMIDOL (ISOVUE-M 200) INJECTION 41%
1.0000 mL | Freq: Once | INTRAMUSCULAR | Status: AC
  Administered 2021-06-19: 1 mL via EPIDURAL

## 2021-06-19 MED ORDER — METHYLPREDNISOLONE ACETATE 40 MG/ML INJ SUSP (RADIOLOG
80.0000 mg | Freq: Once | INTRAMUSCULAR | Status: AC
  Administered 2021-06-19: 80 mg via EPIDURAL

## 2021-06-19 NOTE — Discharge Instructions (Signed)

## 2021-06-20 ENCOUNTER — Other Ambulatory Visit (HOSPITAL_COMMUNITY): Payer: Self-pay | Admitting: Internal Medicine

## 2021-06-20 ENCOUNTER — Other Ambulatory Visit: Payer: Self-pay | Admitting: Internal Medicine

## 2021-06-20 ENCOUNTER — Ambulatory Visit (HOSPITAL_BASED_OUTPATIENT_CLINIC_OR_DEPARTMENT_OTHER)
Admission: RE | Admit: 2021-06-20 | Discharge: 2021-06-20 | Disposition: A | Discharge status: Home / Self Care | Payer: Medicare Other | Source: Ambulatory Visit | Attending: Internal Medicine | Admitting: Internal Medicine

## 2021-06-20 DIAGNOSIS — R0600 Dyspnea, unspecified: Secondary | ICD-10-CM

## 2021-06-20 DIAGNOSIS — R0602 Shortness of breath: Secondary | ICD-10-CM | POA: Diagnosis not present

## 2021-06-20 DIAGNOSIS — N200 Calculus of kidney: Secondary | ICD-10-CM | POA: Diagnosis not present

## 2021-06-20 LAB — POCT I-STAT CREATININE: Creatinine, Ser: 0.8 mg/dL (ref 0.44–1.00)

## 2021-06-20 MED ORDER — IOHEXOL 350 MG/ML SOLN
75.0000 mL | Freq: Once | INTRAVENOUS | Status: AC | PRN
  Administered 2021-06-20: 75 mL via INTRAVENOUS

## 2021-06-26 ENCOUNTER — Ambulatory Visit (INDEPENDENT_AMBULATORY_CARE_PROVIDER_SITE_OTHER): Payer: Medicare Other

## 2021-06-26 DIAGNOSIS — I442 Atrioventricular block, complete: Secondary | ICD-10-CM

## 2021-06-26 LAB — CUP PACEART REMOTE DEVICE CHECK
Battery Remaining Longevity: 19 mo
Battery Voltage: 2.91 V
Brady Statistic AP VP Percent: 1.33 %
Brady Statistic AP VS Percent: 0 %
Brady Statistic AS VP Percent: 98.64 %
Brady Statistic AS VS Percent: 0.03 %
Brady Statistic RA Percent Paced: 1.33 %
Brady Statistic RV Percent Paced: 99.97 %
Date Time Interrogation Session: 20230213133049
Implantable Lead Implant Date: 20140818
Implantable Lead Implant Date: 20140818
Implantable Lead Location: 753859
Implantable Lead Location: 753860
Implantable Lead Model: 5076
Implantable Lead Model: 5076
Implantable Pulse Generator Implant Date: 20140818
Lead Channel Impedance Value: 342 Ohm
Lead Channel Impedance Value: 456 Ohm
Lead Channel Impedance Value: 513 Ohm
Lead Channel Impedance Value: 532 Ohm
Lead Channel Pacing Threshold Amplitude: 0.75 V
Lead Channel Pacing Threshold Amplitude: 1.125 V
Lead Channel Pacing Threshold Pulse Width: 0.4 ms
Lead Channel Pacing Threshold Pulse Width: 0.4 ms
Lead Channel Sensing Intrinsic Amplitude: 1.75 mV
Lead Channel Sensing Intrinsic Amplitude: 1.75 mV
Lead Channel Sensing Intrinsic Amplitude: 21.25 mV
Lead Channel Sensing Intrinsic Amplitude: 21.25 mV
Lead Channel Setting Pacing Amplitude: 2.25 V
Lead Channel Setting Pacing Amplitude: 2.5 V
Lead Channel Setting Pacing Pulse Width: 0.4 ms
Lead Channel Setting Sensing Sensitivity: 0.9 mV

## 2021-06-27 ENCOUNTER — Other Ambulatory Visit: Payer: Self-pay

## 2021-06-27 DIAGNOSIS — M25552 Pain in left hip: Secondary | ICD-10-CM

## 2021-06-28 ENCOUNTER — Other Ambulatory Visit: Payer: Self-pay

## 2021-06-28 DIAGNOSIS — M25552 Pain in left hip: Secondary | ICD-10-CM

## 2021-06-28 NOTE — Progress Notes (Signed)
Remote pacemaker transmission.   

## 2021-07-05 DIAGNOSIS — Z853 Personal history of malignant neoplasm of breast: Secondary | ICD-10-CM | POA: Diagnosis not present

## 2021-07-05 DIAGNOSIS — N6002 Solitary cyst of left breast: Secondary | ICD-10-CM | POA: Diagnosis not present

## 2021-07-05 DIAGNOSIS — Z1231 Encounter for screening mammogram for malignant neoplasm of breast: Secondary | ICD-10-CM | POA: Diagnosis not present

## 2021-07-07 ENCOUNTER — Ambulatory Visit
Admission: RE | Admit: 2021-07-07 | Discharge: 2021-07-07 | Disposition: A | Discharge status: Home / Self Care | Payer: Medicare Other | Source: Ambulatory Visit | Attending: Orthopaedic Surgery | Admitting: Orthopaedic Surgery

## 2021-07-07 ENCOUNTER — Encounter: Payer: Self-pay | Admitting: Orthopaedic Surgery

## 2021-07-07 DIAGNOSIS — M25552 Pain in left hip: Secondary | ICD-10-CM | POA: Diagnosis not present

## 2021-07-07 DIAGNOSIS — Z96642 Presence of left artificial hip joint: Secondary | ICD-10-CM | POA: Diagnosis not present

## 2021-07-07 DIAGNOSIS — Z471 Aftercare following joint replacement surgery: Secondary | ICD-10-CM | POA: Diagnosis not present

## 2021-07-07 DIAGNOSIS — M545 Low back pain, unspecified: Secondary | ICD-10-CM

## 2021-07-07 NOTE — Telephone Encounter (Signed)
Referral entered  

## 2021-07-07 NOTE — Telephone Encounter (Signed)
Please schedule another L-S spine injection at [facility]-radiologist to determine level of injection-thanks

## 2021-07-10 ENCOUNTER — Encounter: Payer: Self-pay | Admitting: Orthopaedic Surgery

## 2021-07-12 ENCOUNTER — Ambulatory Visit
Admission: RE | Admit: 2021-07-12 | Discharge: 2021-07-12 | Disposition: A | Discharge status: Home / Self Care | Payer: Medicare Other | Source: Ambulatory Visit | Attending: Orthopaedic Surgery | Admitting: Orthopaedic Surgery

## 2021-07-12 ENCOUNTER — Other Ambulatory Visit: Payer: Self-pay

## 2021-07-12 DIAGNOSIS — M47817 Spondylosis without myelopathy or radiculopathy, lumbosacral region: Secondary | ICD-10-CM | POA: Diagnosis not present

## 2021-07-12 DIAGNOSIS — M545 Low back pain, unspecified: Secondary | ICD-10-CM

## 2021-07-12 MED ORDER — METHYLPREDNISOLONE ACETATE 40 MG/ML INJ SUSP (RADIOLOG
80.0000 mg | Freq: Once | INTRAMUSCULAR | Status: AC
  Administered 2021-07-12: 80 mg via EPIDURAL

## 2021-07-12 MED ORDER — IOPAMIDOL (ISOVUE-M 200) INJECTION 41%
1.0000 mL | Freq: Once | INTRAMUSCULAR | Status: AC
  Administered 2021-07-12: 1 mL via EPIDURAL

## 2021-07-12 NOTE — Discharge Instructions (Signed)
Post Procedure Spinal Discharge Instruction Sheet ? ?You may resume a regular diet and any medications that you routinely take (including pain medications) unless otherwise noted by MD. ? ?No driving day of procedure. ? ?Light activity throughout the rest of the day.  Do not do any strenuous work, exercise, bending or lifting.  The day following the procedure, you can resume normal physical activity but you should refrain from exercising or physical therapy for at least three days thereafter. ? ?You may apply ice to the injection site, 20 minutes on, 20 minutes off, as needed. Do not apply ice directly to skin.  ? ? ?Common Side Effects: ? ?Headaches- take your usual medications as directed by your physician.  Increase your fluid intake.  Caffeinated beverages may be helpful.  Lie flat in bed until your headache resolves. ? ?Restlessness or inability to sleep- you may have trouble sleeping for the next few days.  Ask your referring physician if you need any medication for sleep. ? ?Facial flushing or redness- should subside within a few days. ? ?Increased pain- a temporary increase in pain a day or two following your procedure is not unusual.  Take your pain medication as prescribed by your referring physician. ? ?Leg cramps ? ?Please contact our office at [phone] for the following symptoms: ?Fever greater than 100 degrees. ?Headaches unresolved with medication after 2-3 days. ?Increased swelling, pain, or redness at injection site. ? ? ?Thank you for visiting [facility] today.   ? ?YOU MAY RESUME YOUR ASPIRIN ANYTIME AFTER INJECTION  ?

## 2021-07-13 ENCOUNTER — Ambulatory Visit: Payer: Medicare Other | Admitting: Orthopaedic Surgery

## 2021-07-27 ENCOUNTER — Encounter: Payer: Self-pay | Admitting: Orthopaedic Surgery

## 2021-08-29 ENCOUNTER — Encounter: Payer: Self-pay | Admitting: Orthopaedic Surgery

## 2021-08-29 ENCOUNTER — Other Ambulatory Visit: Payer: Self-pay

## 2021-08-29 DIAGNOSIS — M545 Low back pain, unspecified: Secondary | ICD-10-CM

## 2021-08-29 DIAGNOSIS — K52831 Collagenous colitis: Secondary | ICD-10-CM | POA: Diagnosis not present

## 2021-09-25 ENCOUNTER — Ambulatory Visit (INDEPENDENT_AMBULATORY_CARE_PROVIDER_SITE_OTHER): Payer: Medicare Other

## 2021-09-25 DIAGNOSIS — I442 Atrioventricular block, complete: Secondary | ICD-10-CM

## 2021-09-27 LAB — CUP PACEART REMOTE DEVICE CHECK
Battery Remaining Longevity: 18 mo
Battery Voltage: 2.9 V
Brady Statistic AP VP Percent: 14.32 %
Brady Statistic AP VS Percent: 0 %
Brady Statistic AS VP Percent: 85.66 %
Brady Statistic AS VS Percent: 0.02 %
Brady Statistic RA Percent Paced: 14.32 %
Brady Statistic RV Percent Paced: 99.98 %
Date Time Interrogation Session: 20230515094203
Implantable Lead Implant Date: 20140818
Implantable Lead Implant Date: 20140818
Implantable Lead Location: 753859
Implantable Lead Location: 753860
Implantable Lead Model: 5076
Implantable Lead Model: 5076
Implantable Pulse Generator Implant Date: 20140818
Lead Channel Impedance Value: 323 Ohm
Lead Channel Impedance Value: 456 Ohm
Lead Channel Impedance Value: 608 Ohm
Lead Channel Impedance Value: 608 Ohm
Lead Channel Pacing Threshold Amplitude: 0.625 V
Lead Channel Pacing Threshold Amplitude: 1.25 V
Lead Channel Pacing Threshold Pulse Width: 0.4 ms
Lead Channel Pacing Threshold Pulse Width: 0.4 ms
Lead Channel Sensing Intrinsic Amplitude: 1.125 mV
Lead Channel Sensing Intrinsic Amplitude: 1.125 mV
Lead Channel Sensing Intrinsic Amplitude: 21.25 mV
Lead Channel Sensing Intrinsic Amplitude: 21.25 mV
Lead Channel Setting Pacing Amplitude: 2.5 V
Lead Channel Setting Pacing Amplitude: 2.5 V
Lead Channel Setting Pacing Pulse Width: 0.4 ms
Lead Channel Setting Sensing Sensitivity: 0.9 mV

## 2021-10-06 DIAGNOSIS — B86 Scabies: Secondary | ICD-10-CM | POA: Diagnosis not present

## 2021-10-13 NOTE — Progress Notes (Signed)
Remote pacemaker transmission.   

## 2021-10-16 DIAGNOSIS — Z779 Other contact with and (suspected) exposures hazardous to health: Secondary | ICD-10-CM | POA: Diagnosis not present

## 2021-10-16 DIAGNOSIS — Z124 Encounter for screening for malignant neoplasm of cervix: Secondary | ICD-10-CM | POA: Diagnosis not present

## 2021-10-16 DIAGNOSIS — Z01419 Encounter for gynecological examination (general) (routine) without abnormal findings: Secondary | ICD-10-CM | POA: Diagnosis not present

## 2021-10-16 DIAGNOSIS — Z9189 Other specified personal risk factors, not elsewhere classified: Secondary | ICD-10-CM | POA: Diagnosis not present

## 2021-10-16 DIAGNOSIS — N952 Postmenopausal atrophic vaginitis: Secondary | ICD-10-CM | POA: Diagnosis not present

## 2021-10-16 DIAGNOSIS — Z853 Personal history of malignant neoplasm of breast: Secondary | ICD-10-CM | POA: Diagnosis not present

## 2021-10-16 DIAGNOSIS — Z01411 Encounter for gynecological examination (general) (routine) with abnormal findings: Secondary | ICD-10-CM | POA: Diagnosis not present

## 2021-10-16 DIAGNOSIS — Z0142 Encounter for cervical smear to confirm findings of recent normal smear following initial abnormal smear: Secondary | ICD-10-CM | POA: Diagnosis not present

## 2021-10-16 DIAGNOSIS — Z6828 Body mass index (BMI) 28.0-28.9, adult: Secondary | ICD-10-CM | POA: Diagnosis not present

## 2021-10-18 ENCOUNTER — Other Ambulatory Visit: Payer: Self-pay | Admitting: Obstetrics

## 2021-10-18 DIAGNOSIS — Z803 Family history of malignant neoplasm of breast: Secondary | ICD-10-CM

## 2021-10-20 DIAGNOSIS — M48062 Spinal stenosis, lumbar region with neurogenic claudication: Secondary | ICD-10-CM | POA: Diagnosis not present

## 2021-10-23 ENCOUNTER — Other Ambulatory Visit (HOSPITAL_COMMUNITY): Payer: Self-pay | Admitting: Neurological Surgery

## 2021-10-23 ENCOUNTER — Other Ambulatory Visit: Payer: Self-pay | Admitting: Neurological Surgery

## 2021-10-23 DIAGNOSIS — M48062 Spinal stenosis, lumbar region with neurogenic claudication: Secondary | ICD-10-CM

## 2021-10-25 ENCOUNTER — Other Ambulatory Visit: Payer: Self-pay

## 2021-10-25 ENCOUNTER — Ambulatory Visit (HOSPITAL_COMMUNITY)
Admission: RE | Admit: 2021-10-25 | Discharge: 2021-10-25 | Disposition: A | Discharge status: Home / Self Care | Payer: Medicare Other | Source: Ambulatory Visit | Attending: Neurological Surgery | Admitting: Neurological Surgery

## 2021-10-25 DIAGNOSIS — M48062 Spinal stenosis, lumbar region with neurogenic claudication: Secondary | ICD-10-CM

## 2021-10-25 DIAGNOSIS — M4726 Other spondylosis with radiculopathy, lumbar region: Secondary | ICD-10-CM | POA: Diagnosis not present

## 2021-10-25 DIAGNOSIS — M549 Dorsalgia, unspecified: Secondary | ICD-10-CM | POA: Diagnosis not present

## 2021-10-25 DIAGNOSIS — M5136 Other intervertebral disc degeneration, lumbar region: Secondary | ICD-10-CM | POA: Diagnosis not present

## 2021-10-25 MED ORDER — IOHEXOL 180 MG/ML  SOLN
20.0000 mL | Freq: Once | INTRAMUSCULAR | Status: AC | PRN
  Administered 2021-10-25: 12 mL via INTRATHECAL

## 2021-10-25 MED ORDER — DIAZEPAM 5 MG PO TABS
ORAL_TABLET | ORAL | Status: AC
  Administered 2021-10-25: 10 mg via ORAL
  Filled 2021-10-25: qty 2

## 2021-10-25 MED ORDER — ONDANSETRON HCL 4 MG/2ML IJ SOLN: 4.0000 mg | Freq: Four times a day (QID) | INTRAMUSCULAR | Status: DC | PRN

## 2021-10-25 MED ORDER — DIAZEPAM 5 MG PO TABS
10.0000 mg | ORAL_TABLET | Freq: Once | ORAL | Status: AC
  Filled 2021-10-25: qty 2

## 2021-10-25 MED ORDER — LIDOCAINE HCL (PF) 1 % IJ SOLN
5.0000 mL | Freq: Once | INTRAMUSCULAR | Status: AC
  Administered 2021-10-25: 3 mL via INTRADERMAL

## 2021-10-25 MED ORDER — HYDROCODONE-ACETAMINOPHEN 5-325 MG PO TABS: 1.0000 | ORAL_TABLET | ORAL | Status: DC | PRN

## 2021-10-25 NOTE — Procedures (Signed)
[name] is a 76 year old individual whose had progressive back pain and leg discomfort with weakness in her lower extremities.  CT scan suggest the presence of diffuse spondylitic disease and she underwent conservative treatment with a number of epidural steroid injections which have yielded minimal relief.  She cannot undergo an MRI and for that reason she is having a myelogram and postmyelogram CAT scan to evaluate the degree and nature of his significant lumbar spinal stenosis.  Pre op Dx: Lumbar spinal stenosis neurogenic claudication lumbar radiculopathy. Post op Dx: Same Procedure: Lumbar myelogram Surgeon: Analese Sovine Puncture level: L3-4 Fluid color: Clear colorless Injection: Isovue 180, 12 mL Findings: Mixed injection with modest spondylosis in the lumbar spine but no severe stenosis.  Further evaluation with CT scanning.

## 2021-10-27 DIAGNOSIS — M48062 Spinal stenosis, lumbar region with neurogenic claudication: Secondary | ICD-10-CM | POA: Diagnosis not present

## 2021-11-06 ENCOUNTER — Encounter: Payer: Self-pay | Admitting: Neurology

## 2021-11-06 ENCOUNTER — Ambulatory Visit (INDEPENDENT_AMBULATORY_CARE_PROVIDER_SITE_OTHER): Payer: Medicare Other | Admitting: Neurology

## 2021-11-06 VITALS — BP 122/76 | HR 80 | Ht 67.0 in | Wt 180.0 lb

## 2021-11-06 DIAGNOSIS — M5416 Radiculopathy, lumbar region: Secondary | ICD-10-CM | POA: Diagnosis not present

## 2021-11-06 DIAGNOSIS — R269 Unspecified abnormalities of gait and mobility: Secondary | ICD-10-CM | POA: Diagnosis not present

## 2021-11-06 MED ORDER — GABAPENTIN 300 MG PO CAPS: 600.0000 mg | ORAL_CAPSULE | Freq: Three times a day (TID) | ORAL | 3 refills | Status: DC

## 2021-11-06 NOTE — Progress Notes (Signed)
Chief Complaint  Patient presents with   New Patient (Initial Visit)    Rm 15 with daughter in law, Kim NX Willis/Jess/Paper/[place] [facility]/Henry Elsner MD [phone] leg weakness/      ASSESSMENT AND PLAN  [name] is a 76 y.o. female   Lower extremity radicular pain, left low back pain,  History most suggestive of left lumbar radiculopathy,  However CT myelogram of lumbar spine showed multilevel degenerative changes without definite neural foraminal narrowing,  CT of left hip showed left total hip arthroplasty without hardware failure or complication  EMG nerve conduction study  Add on gabapentin may titrating to 2 tablets 3 times a day   DIAGNOSTIC DATA (LABS, IMAGING, TESTING) - I reviewed patient records, labs, notes, testing and imaging myself where available.   MEDICAL HISTORY:  [name] is a 76 year old female, seen in request by neurosurgeon Dr. Kristeen Miss, MD for evaluation of low back pain, leg weakness, her primary care physician is Dr. Burnard Bunting, initial evaluation was on November 10, 2021  I reviewed and summarized the referring note.PMHX. S/p pacemaker. Histroy of right cervical radiculopathy, surgery did help.   In October 2022, after lifting a heavy box of books, she noticed low back pain, muscle spasm and lower back  She was initially treated with NSAIDs, muscle relaxant with some help  But since then, with prolonged standing, bending over she will develop low back pain, then was giving steroid, physical therapy,  She had a history of bilateral hip replacement,  Over the past few months, she developed worsening low back pain, radiating pain to left lateral thigh region, she received injection for left hip bursitis, did not help her symptoms  CT of left hip in February 2023 showed total arthroplasty without hardware failure or complication  CT myelogram October 25, 2021 showed lumbar region disc bulging, facet  osteoarthritis with mild narrowing of lateral recess at L3-4, L4-5 without definite neural compression,  CT thoracic spine showed chronic compression deformity of T3 no significant canal stenosis  She now complains of significant left lower extremity weakness, when she lying down sitting down no significant pain, when she getting up she complains of significant left leg pain 9 out of 10, radiating to the left lateral leg, deep aching pain at nighttime, has to take frequent over-the-counter medications   PHYSICAL EXAM:   Vitals:   11/06/21 1321  BP: 122/76  Pulse: 80  Weight: 180 lb (81.6 kg)  Height: '5\' 7"'$  (1.702 m)   Not recorded     Body mass index is 28.19 kg/m.  PHYSICAL EXAMNIATION:  Gen: NAD, conversant, well nourised, well groomed                     Cardiovascular: Regular rate rhythm, no peripheral edema, warm, nontender. Eyes: Conjunctivae clear without exudates or hemorrhage Neck: Supple, no carotid bruits. Pulmonary: Clear to auscultation bilaterally   NEUROLOGICAL EXAM:  MENTAL STATUS: Speech/cognition: Awake, alert, oriented to history taking and casual conversation CRANIAL NERVES: CN II: Visual fields are full to confrontation. Pupils are round equal and briskly reactive to light. CN III, IV, VI: extraocular movement are normal. No ptosis. CN V: Facial sensation is intact to light touch CN VII: Face is symmetric with normal eye closure  CN VIII: Hearing is normal to causal conversation. CN IX, X: Phonation is normal. CN XI: Head turning and shoulder shrug are intact  MOTOR: There was no significant bilateral lower extremity proximal and distal  muscle weakness.  REFLEXES: Reflexes are trace and symmetric at the biceps, triceps, knees, and ankles. Plantar responses are flexor.  SENSORY: Intact to light touch, pinprick and vibratory sensation are intact in fingers and toes.  COORDINATION: There is no trunk or limb dysmetria noted.  GAIT/STANCE: She  needs push-up to get up from seated position, antalgic, cautious,  REVIEW OF SYSTEMS:  Full 14 system review of systems performed and notable only for as above All other review of systems were negative.   ALLERGIES: Allergies  Allergen Reactions   Anesthetics, Amide Other (See Comments)    Patient unsure of names, however multiples cause swelling of airway & nausea   Clindamycin Swelling   Fentanyl Anaphylaxis   Shellfish Allergy Other (See Comments)    Neurotoxic reaction   Sulfonamide Derivatives Anaphylaxis and Swelling   Neomycin Swelling   Zolpidem Tartrate Other (See Comments)    Jerking motions    Azithromycin Other (See Comments)    Severe gastritis    Bactroban Other (See Comments)    Causes sores in nose   Ciprofloxacin     joint swelling   Codeine Swelling   Meperidine Hcl Nausea Only    Hallucinations    Morphine Nausea Only    Hallucinations    Neosporin [Neomycin-Polymyxin-Gramicidin] Hives and Dermatitis    All topical "orin's ointment"   Nitrofurantoin     neuropathy in legs   Other     ALLERGIC TO WARM WATER SHELLFISH     Penicillins Other (See Comments)    Swelling in joints   Percocet [Oxycodone-Acetaminophen]     Dizziness    Tramadol     Makes jerk   Latex Rash    HOME MEDICATIONS: Current Outpatient Medications  Medication Sig Dispense Refill   acetaminophen (TYLENOL) 500 MG tablet Take 1,000 mg by mouth 2 (two) times daily.     albuterol (PROVENTIL HFA;VENTOLIN HFA) 108 (90 Base) MCG/ACT inhaler Inhale 2 puffs into the lungs every 6 (six) hours as needed. For shortness of breath (Patient taking differently: Inhale 2 puffs into the lungs every 6 (six) hours as needed for wheezing or shortness of breath.) 1 Inhaler 1   aspirin EC 81 MG EC tablet Take 1 tablet (81 mg total) by mouth daily.     budesonide (ENTOCORT EC) 3 MG 24 hr capsule Take 3 mg by mouth daily.     calcium carbonate (OSCAL) 1500 (600 Ca) MG TABS tablet Take 600 mg of  elemental calcium by mouth daily.     cetirizine (ZYRTEC) 10 MG tablet Take 10 mg by mouth 2 (two) times daily.     cholecalciferol (VITAMIN D) 1000 UNITS tablet Take 1,000 Units by mouth daily.     cycloSPORINE (RESTASIS) 0.05 % ophthalmic emulsion Place 1 drop into both eyes 2 (two) times daily.     diclofenac Sodium (VOLTAREN) 1 % GEL Apply 1 application  topically daily as needed (pain).     escitalopram (LEXAPRO) 10 MG tablet Take 10 mg by mouth daily.     esomeprazole (NEXIUM) 20 MG capsule Take 20 mg by mouth 2 (two) times daily.     estradiol (ESTRING) 2 MG vaginal ring Place 2 mg vaginally every 3 (three) months. follow package directions     fludrocortisone (FLORINEF) 0.1 MG tablet TAKE 1 TABLET TWICE A DAY (MUST KEEP UPCOMING APPOINTMENT IN JANUARY 2023 WITH DR. Caryl Comes BEFORE ANYMORE REFILLS) (Patient taking differently: Take 0.1 mg by mouth daily.) 180 tablet 3   levothyroxine (  SYNTHROID) 50 MCG tablet Take 50 mcg by mouth daily before breakfast.     Magnesium 250 MG TABS Take 250 mg by mouth daily.     meclizine (ANTIVERT) 25 MG tablet Take 25 mg by mouth 3 (three) times daily as needed for dizziness.      Melatonin 1 MG TABS Take 1 tablet by mouth at bedtime.      ondansetron (ZOFRAN) 4 MG tablet Take 4 mg by mouth every 8 (eight) hours as needed for nausea.      potassium chloride (KLOR-CON) 10 MEQ tablet TAKE 1 TABLET DAILY 90 tablet 3   Probiotic Product (ALIGN) 4 MG CAPS Take 4 mg by mouth at bedtime.      rosuvastatin (CRESTOR) 10 MG tablet Take 1 tablet (10 mg total) by mouth daily. (Patient taking differently: Take 10 mg by mouth at bedtime.) 90 tablet 0   vitamin B-12 (CYANOCOBALAMIN) 500 MCG tablet Take 500 mcg by mouth daily.     No current facility-administered medications for this visit.   Facility-Administered Medications Ordered in Other Visits  Medication Dose Route Frequency Provider Last Rate Last Admin   bupivacaine (MARCAINE) 0.5 % 10 mL, triamcinolone acetonide  (KENALOG-40) 40 mg injection   Subcutaneous Once Carolan Clines, MD       bupivacaine (MARCAINE) 0.5 % 15 mL, phenazopyridine (PYRIDIUM) 400 mg bladder mixture   Bladder Instillation Once Carolan Clines, MD        PAST MEDICAL HISTORY: Past Medical History:  Diagnosis Date   ADHD (attention deficit hyperactivity disorder)    Allergy    trees/pollen, mold, fungus, dust mites. Takes allergy shots   Arthritis    PAIN AND OA LEFT HIP   Asthma    allergist Dr Olena Heckle- monthly allergy injections   Autonomic dysfunction    CENTRAL NERVOUS SYSTEM NEUROPATHY - DX BY DR. Erling Cruz MORE THAN 10 YRS AGO - AND IT IS FELT TO CONTRIBUTE TO THE AUTOMIC  DYSFUNCTION-- PT HAS NUMBNESS LEGS AND FEET AND SOMETIIMES TIPS OF FINGER, SEVERE CONSTIPATION( NO SENSATION TO HAVE BM ), DOUBLE VISION, ORTHOSTATIC HYPOTENSION   Blood transfusion    Breast cancer ([place])    right side   Bruises easily    Cataract    Chest pain    a. 12/2012 Cath: nl cors, EF 55-65%.   Complication of anesthesia    reaction to some anesthetics/ 7/12 anesth record on chart- states prefers epidural   CVA (cerebrovascular accident) ([place]) 03/21/2018   Depression    Diverticulosis    Dysrhythmia    HX OF HIGH GRADE HEART BLOCK - REQUIRED PACEMAKER INSERTION   Esophageal stricture    Gastritis    GERD (gastroesophageal reflux disease)    H/O hiatal hernia    Hemorrhoids    Hernia of abdominal wall    spigelian hernia RLQ - SURGERY TO REPAIR   Hypothyroidism    Interstitial cystitis    Latex allergy, contact dermatitis    M[place]ory - Weiss tear    HEALED    Neuromuscular disorder (HCC)    central nervous system neuropathy- seen per Dr Erling Cruz   Pacemaker    Pericardial effusion    a. 12/2012 following ppm placement;  b. 01/01/2013 Echo: EF 55-60%, small pericardial effusion w/o RV collapse-->No need for tap/window.   PONV (postoperative nausea and vomiting)    pt needs scop patch   Recurrent upper respiratory infection (URI)  1/13- to present   bronchitis following surgery- states improved but still with  cough. OV with [facility] Dr [name] 09/06/11 on chart   Shortness of breath    AT TIMES - BUT [place].   Symptomatic bradycardia    a. 12/2012 s/p MDT dual chamber PPM, ser # [id number]; b. 12/2012 post-op course complicated by pericardial effusinon req lead revision.   Thyroid disease     PAST SURGICAL HISTORY: Past Surgical History:  Procedure Laterality Date   ABDOMINAL HYSTERECTOMY  1974   BACK SURGERY     cervical fusion 4-5 with plate   BLADDER SUSPENSION     BREAST BIOPSY  2002   NO BLOOD PRESSURES ON RIGHT SIDE/   s/p  axillary node dissection   CYSTO WITH HYDRODISTENSION  09/07/2011   Procedure: CYSTOSCOPY/HYDRODISTENSION;  Surgeon: Ailene Rud, MD;  Location: [facility];  Service: Urology;  Laterality: N/A;  INSTILLATION OF MARCAINE/PYRIDIUM INSTILLATION OF MARCAINE/KENALOG   CYSTOSCOPY  1975, 2007   EYE SURGERY     LASIK EYE SURGERY BILATERAL   LAPAROSCOPY  1973   LEAD REVISION N/A 12/30/2012   Procedure: LEAD REVISION;  Surgeon: Evans Lance, MD;  Location: [facility] CATH LAB;  Service: Cardiovascular;  Laterality: N/A;   LEFT HEART CATHETERIZATION WITH CORONARY ANGIOGRAM N/A 12/29/2012   Procedure: LEFT HEART CATHETERIZATION WITH CORONARY ANGIOGRAM;  Surgeon: Peter M Martinique, MD;  Location: [facility] C[facility] LAB;  Service: Cardiovascular;  Laterality: N/A;   MASTECTOMY MODIFIED RADICAL     right; with immediate reconstruction   MYRINGOPLASTY  1962   NECK SURGERY     c4-5 ruptured disc   OOPHORECTOMY  1982   PACEMAKER INSERTION     PERMANENT PACEMAKER INSERTION N/A 12/29/2012   Procedure: PERMANENT PACEMAKER INSERTION;  Surgeon: Deboraha Sprang, MD;  Location: [facility] CATH LAB;  Service: Cardiovascular;  Laterality: N/A;   RECTOCELE REPAIR     with cystocele repair   TONSILLECTOMY     TOTAL HIP ARTHROPLASTY Right    TOTAL HIP ARTHROPLASTY Left 06/12/2013   Procedure:  LEFT TOTAL HIP ARTHROPLASTY ANTERIOR APPROACH;  Surgeon: Mcarthur Rossetti, MD;  Location: [facility];  Service: Orthopedics;  Laterality: Left;   TYMPANOPLASTY  1973   right   ULNAR NERVE TRANSPOSITION Right    VENTRAL HERNIA REPAIR  05/30/2011   Procedure: HERNIA REPAIR VENTRAL ADULT;  Surgeon: Haywood Lasso, MD;  Location: [place];  Service: General;  Laterality: Right;  repair right spigelian hernia    FAMILY HISTORY: Family History  Problem Relation Age of Onset   Heart disease Father    Colon cancer Mother 1   Depression Mother    Pancreatic cancer Sister 12   Diverticulitis Sister    Uterine cancer Maternal Grandmother    Heart disease Brother    Heart disease Paternal Grandmother    Heart disease Paternal Grandfather    Throat cancer Maternal Uncle    Heart disease Paternal Aunt    Heart disease Paternal Uncle    Heart disease Brother    Thrombosis Maternal Aunt    Cancer Maternal Uncle        NOS   Diabetes Other        aunt    SOCIAL HISTORY: Social History   Socioeconomic History   Marital status: Married    Spouse name: Not on file   Number of children: 2   Years of education: 15   Highest education level: Not on file  Occupational History   Occupation: Retired    Fish farm manager: [employer]    Comment: vp corporate affairs [employer]    Employer: [employer]  Tobacco Use   Smoking status: Never   Smokeless tobacco: Never  Vaping Use   Vaping Use: Never used  Substance and Sexual Activity   Alcohol use: Yes    Alcohol/week: 7.0 standard drinks of alcohol    Types: 7 Glasses of wine per week    Comment: occasional   Drug use: No   Sexual activity: Not on file  Other Topics Concern   Not on file  Social History Narrative   Lives at home w/ her husband   Patient drinks 1-2 cup of caffeine daily.   Patient is right handed.   Social Determinants of Health   Financial Resource Strain: Not on file   Food Insecurity: Not on file  Transportation Needs: Not on file  Physical Activity: Not on file  Stress: Not on file  Social Connections: Not on file  Intimate Partner Violence: Not on file      Marcial Pacas, M.D. Ph.D.  [facility] [address], [place], [place] [address] Ph: [phone] Fax: [phone]  CC:  Kristeen Miss, MD [address] [place],  [place] [address]  Burnard Bunting, MD

## 2021-11-20 ENCOUNTER — Other Ambulatory Visit: Payer: Self-pay

## 2021-11-20 MED ORDER — GABAPENTIN 300 MG PO CAPS: 600.0000 mg | ORAL_CAPSULE | Freq: Three times a day (TID) | ORAL | 3 refills | Status: DC

## 2021-11-20 NOTE — Progress Notes (Signed)
Rx refilled for 90 day supply to [facility].

## 2021-12-11 DIAGNOSIS — E039 Hypothyroidism, unspecified: Secondary | ICD-10-CM | POA: Diagnosis not present

## 2021-12-11 DIAGNOSIS — R7989 Other specified abnormal findings of blood chemistry: Secondary | ICD-10-CM | POA: Diagnosis not present

## 2021-12-11 DIAGNOSIS — E785 Hyperlipidemia, unspecified: Secondary | ICD-10-CM | POA: Diagnosis not present

## 2021-12-11 DIAGNOSIS — R5383 Other fatigue: Secondary | ICD-10-CM | POA: Diagnosis not present

## 2021-12-12 DIAGNOSIS — H5212 Myopia, left eye: Secondary | ICD-10-CM | POA: Diagnosis not present

## 2021-12-12 DIAGNOSIS — Z961 Presence of intraocular lens: Secondary | ICD-10-CM | POA: Diagnosis not present

## 2021-12-12 DIAGNOSIS — H04123 Dry eye syndrome of bilateral lacrimal glands: Secondary | ICD-10-CM | POA: Diagnosis not present

## 2021-12-12 DIAGNOSIS — H5111 Convergence insufficiency: Secondary | ICD-10-CM | POA: Diagnosis not present

## 2021-12-18 DIAGNOSIS — Z0000 Encounter for general adult medical examination without abnormal findings: Secondary | ICD-10-CM | POA: Diagnosis not present

## 2021-12-18 DIAGNOSIS — Z1331 Encounter for screening for depression: Secondary | ICD-10-CM | POA: Diagnosis not present

## 2021-12-18 DIAGNOSIS — I480 Paroxysmal atrial fibrillation: Secondary | ICD-10-CM | POA: Diagnosis not present

## 2021-12-18 DIAGNOSIS — E039 Hypothyroidism, unspecified: Secondary | ICD-10-CM | POA: Diagnosis not present

## 2021-12-18 DIAGNOSIS — Z1339 Encounter for screening examination for other mental health and behavioral disorders: Secondary | ICD-10-CM | POA: Diagnosis not present

## 2021-12-18 DIAGNOSIS — E785 Hyperlipidemia, unspecified: Secondary | ICD-10-CM | POA: Diagnosis not present

## 2021-12-18 DIAGNOSIS — G909 Disorder of the autonomic nervous system, unspecified: Secondary | ICD-10-CM | POA: Diagnosis not present

## 2021-12-18 DIAGNOSIS — K219 Gastro-esophageal reflux disease without esophagitis: Secondary | ICD-10-CM | POA: Diagnosis not present

## 2021-12-18 DIAGNOSIS — R82998 Other abnormal findings in urine: Secondary | ICD-10-CM | POA: Diagnosis not present

## 2021-12-25 ENCOUNTER — Ambulatory Visit: Payer: Medicare Other

## 2021-12-25 ENCOUNTER — Encounter (HOSPITAL_COMMUNITY): Payer: Self-pay

## 2021-12-25 ENCOUNTER — Other Ambulatory Visit (HOSPITAL_COMMUNITY): Payer: Self-pay | Admitting: Obstetrics

## 2021-12-25 DIAGNOSIS — Z803 Family history of malignant neoplasm of breast: Secondary | ICD-10-CM

## 2021-12-26 LAB — CUP PACEART REMOTE DEVICE CHECK
Battery Remaining Longevity: 13 mo
Battery Voltage: 2.89 V
Brady Statistic AP VP Percent: 18.57 %
Brady Statistic AP VS Percent: 0 %
Brady Statistic AS VP Percent: 81.41 %
Brady Statistic AS VS Percent: 0.02 %
Brady Statistic RA Percent Paced: 18.57 %
Brady Statistic RV Percent Paced: 99.98 %
Date Time Interrogation Session: 20230814123258
Implantable Lead Implant Date: 20140818
Implantable Lead Implant Date: 20140818
Implantable Lead Location: 753859
Implantable Lead Location: 753860
Implantable Lead Model: 5076
Implantable Lead Model: 5076
Implantable Pulse Generator Implant Date: 20140818
Lead Channel Impedance Value: 323 Ohm
Lead Channel Impedance Value: 456 Ohm
Lead Channel Impedance Value: 494 Ohm
Lead Channel Impedance Value: 532 Ohm
Lead Channel Pacing Threshold Amplitude: 0.75 V
Lead Channel Pacing Threshold Amplitude: 1 V
Lead Channel Pacing Threshold Pulse Width: 0.4 ms
Lead Channel Pacing Threshold Pulse Width: 0.4 ms
Lead Channel Sensing Intrinsic Amplitude: 1.25 mV
Lead Channel Sensing Intrinsic Amplitude: 1.25 mV
Lead Channel Sensing Intrinsic Amplitude: 21.25 mV
Lead Channel Sensing Intrinsic Amplitude: 21.25 mV
Lead Channel Setting Pacing Amplitude: 2.25 V
Lead Channel Setting Pacing Amplitude: 2.5 V
Lead Channel Setting Pacing Pulse Width: 0.4 ms
Lead Channel Setting Sensing Sensitivity: 0.9 mV

## 2021-12-27 ENCOUNTER — Ambulatory Visit (INDEPENDENT_AMBULATORY_CARE_PROVIDER_SITE_OTHER): Payer: Medicare Other | Admitting: Neurology

## 2021-12-27 VITALS — BP 118/64 | HR 78 | Ht 67.0 in | Wt 180.0 lb

## 2021-12-27 DIAGNOSIS — I6312 Cerebral infarction due to embolism of basilar artery: Secondary | ICD-10-CM | POA: Diagnosis not present

## 2021-12-27 DIAGNOSIS — M5416 Radiculopathy, lumbar region: Secondary | ICD-10-CM

## 2021-12-27 DIAGNOSIS — R269 Unspecified abnormalities of gait and mobility: Secondary | ICD-10-CM | POA: Diagnosis not present

## 2021-12-27 DIAGNOSIS — M79604 Pain in right leg: Secondary | ICD-10-CM

## 2021-12-27 DIAGNOSIS — M791 Myalgia, unspecified site: Secondary | ICD-10-CM | POA: Diagnosis not present

## 2021-12-27 DIAGNOSIS — E678 Other specified hyperalimentation: Secondary | ICD-10-CM

## 2021-12-27 NOTE — Progress Notes (Signed)
ASSESSMENT AND PLAN  [name] is a 76 y.o. female   Worsening gait abnormality Bilateral lower extremity deep achy pain  EMG nerve conduction study today showed no evidence of active lumbar radiculopathy to explain her pain and subjective weakness gait abnormality,  She has significant tenderness upon deep palpitation at proximal upper and lower extremity muscles,  CT myelogram of lumbar spine showed multilevel degenerative changes without definite neural foraminal narrowing,  CT of left hip showed left total hip arthroplasty without hardware failure or complication  Laboratory evaluation, inflammatory markers, including C-reactive protein, ESR,  CT cervical spine to rule out cervical spondylitic myelopathy  Will hold off physical therapy at this point, if laboratory evaluation showed abnormalities suggestive of inflammatory process such as polymyalgia rheumatica, may consider low-dose of steroid treatment, she did reported dramatic improvement with prednisone tapering dose few months ago,   DIAGNOSTIC DATA (LABS, IMAGING, TESTING) - I reviewed patient records, labs, notes, testing and imaging myself where available.   MEDICAL HISTORY:  [name] is a 76 year old female, seen in request by neurosurgeon Dr. Kristeen Miss, MD for evaluation of low back pain, leg weakness, her primary care physician is Dr. Burnard Bunting, initial evaluation was on November 10, 2021  I reviewed and summarized the referring note.PMHX. S/p pacemaker. Histroy of right cervical radiculopathy, surgery did help.   In October 2022, after lifting a heavy box of books, she noticed low back pain, muscle spasm and lower back  She was initially treated with NSAIDs, muscle relaxant with some help  But since then, with prolonged standing, bending over she will develop low back pain, then was giving steroid, physical therapy,  She had a history of bilateral hip replacement,  Over the past  few months, she developed worsening low back pain, radiating pain to left lateral thigh region, she received injection for left hip bursitis, did not help her symptoms  CT of left hip in February 2023 showed total arthroplasty without hardware failure or complication  CT myelogram October 25, 2021 showed lumbar region disc bulging, facet osteoarthritis with mild narrowing of lateral recess at L3-4, L4-5 without definite neural compression,  CT thoracic spine showed chronic compression deformity of T3 no significant canal stenosis  She now complains of significant left lower extremity weakness, when she lying down sitting down no significant pain, when she getting up she complains of significant left leg pain 9 out of 10, radiating to the left lateral leg, deep aching pain at nighttime, has to take frequent over-the-counter medications  UPDATE August 16th 2023: Patient is accompanied by her husband return for electrodiagnostic study today, which showed evidence of mild peripheral neuropathy if anything, the test is also limited due to her lower extremity pitting edema, relative preserved distal lower extremity motor nerve conduction study  EMG nerve conduction study showed no evidence of active lumbar radiculopathy  On further questioning, today she mainly complains of subacute onset of bilateral lower extremity deep achy pain, mainly involving left lateral thigh,  now similar involvement of the right side, also complains of low back pain, upper extremity pain  Initially she was treated with tapering dose of the steroid for 1 week, she did reported significant improvement afterwards, benefit last for more than 4 weeks, but she could not tolerate the side effect of high-dose of prednisone after 60 mg daily,  She later had few sessions of epidural injection without any benefit  Today she also complains of worsening urinary urgency, to the  point of incontinence,  She did have a history of cervical  decompression surgery in the past, for severe right cervical radiculopathy, denied gait abnormality prior to her cervical decompression surgery  PHYSICAL EXAM:   PHYSICAL EXAMNIATION: Blood pressure 130/80  Gen: NAD, conversant, well nourised, well groomed                     Cardiovascular: Regular rate rhythm, no peripheral edema, warm, nontender. Eyes: Conjunctivae clear without exudates or hemorrhage Neck: Supple, no carotid bruits.   NEUROLOGICAL EXAM:  MENTAL STATUS: Speech/cognition: Awake, alert, oriented to history taking and casual conversation CRANIAL NERVES: CN II: Visual fields are full to confrontation. Pupils are round equal and briskly reactive to light. CN III, IV, VI: extraocular movement are normal. No ptosis. CN V: Facial sensation is intact to light touch CN VII: Face is symmetric with normal eye closure  CN VIII: Hearing is normal to causal conversation. CN IX, X: Phonation is normal. CN XI: Head turning and shoulder shrug are intact  MOTOR: Upper and lower extremity motor strength testing was limited by pain, she also has noticeable bilateral lower extremity pitting edema to mid shin level,  REFLEXES: Reflexes are trace and symmetric at the biceps, triceps, knees, and ankles. Plantar responses are flexor.  SENSORY: Decreased vibratory sensation to below knee level, decreased to pinprick to mid shin level,  COORDINATION: There is no trunk or limb dysmetria noted.  GAIT/STANCE: She needs push-up to get up from seated position, antalgic, cautious, able to stand up on tiptoes and heels  REVIEW OF SYSTEMS:  Full 14 system review of systems performed and notable only for as above All other review of systems were negative.   ALLERGIES: Allergies  Allergen Reactions   Anesthetics, Amide Other (See Comments)    Patient unsure of names, however multiples cause swelling of airway & nausea   Clindamycin Swelling   Fentanyl Anaphylaxis   Shellfish Allergy  Other (See Comments)    Neurotoxic reaction   Sulfonamide Derivatives Anaphylaxis and Swelling   Neomycin Swelling   Zolpidem Tartrate Other (See Comments)    Jerking motions    Azithromycin Other (See Comments)    Severe gastritis    Bactroban Other (See Comments)    Causes sores in nose   Ciprofloxacin     joint swelling   Codeine Swelling   Meperidine Hcl Nausea Only    Hallucinations    Morphine Nausea Only    Hallucinations    Neosporin [Neomycin-Polymyxin-Gramicidin] Hives and Dermatitis    All topical "orin's ointment"   Nitrofurantoin     neuropathy in legs   Other     ALLERGIC TO WARM WATER SHELLFISH     Penicillins Other (See Comments)    Swelling in joints   Percocet [Oxycodone-Acetaminophen]     Dizziness    Tramadol     Makes jerk   Latex Rash    HOME MEDICATIONS: Current Outpatient Medications  Medication Sig Dispense Refill   acetaminophen (TYLENOL) 500 MG tablet Take 1,000 mg by mouth 2 (two) times daily.     albuterol (PROVENTIL HFA;VENTOLIN HFA) 108 (90 Base) MCG/ACT inhaler Inhale 2 puffs into the lungs every 6 (six) hours as needed. For shortness of breath (Patient taking differently: Inhale 2 puffs into the lungs every 6 (six) hours as needed for wheezing or shortness of breath.) 1 Inhaler 1   aspirin EC 81 MG EC tablet Take 1 tablet (81 mg total) by mouth daily.  budesonide (ENTOCORT EC) 3 MG 24 hr capsule Take 3 mg by mouth daily.     calcium carbonate (OSCAL) 1500 (600 Ca) MG TABS tablet Take 600 mg of elemental calcium by mouth daily.     cetirizine (ZYRTEC) 10 MG tablet Take 10 mg by mouth 2 (two) times daily.     cholecalciferol (VITAMIN D) 1000 UNITS tablet Take 1,000 Units by mouth daily.     cycloSPORINE (RESTASIS) 0.05 % ophthalmic emulsion Place 1 drop into both eyes 2 (two) times daily.     diclofenac Sodium (VOLTAREN) 1 % GEL Apply 1 application  topically daily as needed (pain).     escitalopram (LEXAPRO) 10 MG tablet Take 10 mg by  mouth daily.     esomeprazole (NEXIUM) 20 MG capsule Take 20 mg by mouth 2 (two) times daily.     estradiol (ESTRING) 2 MG vaginal ring Place 2 mg vaginally every 3 (three) months. follow package directions     fludrocortisone (FLORINEF) 0.1 MG tablet TAKE 1 TABLET TWICE A DAY (MUST KEEP UPCOMING APPOINTMENT IN JANUARY 2023 WITH DR. Caryl Comes BEFORE ANYMORE REFILLS) (Patient taking differently: Take 0.1 mg by mouth daily.) 180 tablet 3   gabapentin (NEURONTIN) 300 MG capsule Take 2 capsules (600 mg total) by mouth 3 (three) times daily. 180 capsule 3   levothyroxine (SYNTHROID) 50 MCG tablet Take 50 mcg by mouth daily before breakfast.     Magnesium 250 MG TABS Take 250 mg by mouth daily.     meclizine (ANTIVERT) 25 MG tablet Take 25 mg by mouth 3 (three) times daily as needed for dizziness.      Melatonin 1 MG TABS Take 1 tablet by mouth at bedtime.      ondansetron (ZOFRAN) 4 MG tablet Take 4 mg by mouth every 8 (eight) hours as needed for nausea.      potassium chloride (KLOR-CON) 10 MEQ tablet TAKE 1 TABLET DAILY 90 tablet 3   Probiotic Product (ALIGN) 4 MG CAPS Take 4 mg by mouth at bedtime.      rosuvastatin (CRESTOR) 10 MG tablet Take 1 tablet (10 mg total) by mouth daily. (Patient taking differently: Take 10 mg by mouth at bedtime.) 90 tablet 0   vitamin B-12 (CYANOCOBALAMIN) 500 MCG tablet Take 500 mcg by mouth daily.     No current facility-administered medications for this visit.   Facility-Administered Medications Ordered in Other Visits  Medication Dose Route Frequency Provider Last Rate Last Admin   bupivacaine (MARCAINE) 0.5 % 10 mL, triamcinolone acetonide (KENALOG-40) 40 mg injection   Subcutaneous Once Carolan Clines, MD       bupivacaine (MARCAINE) 0.5 % 15 mL, phenazopyridine (PYRIDIUM) 400 mg bladder mixture   Bladder Instillation Once Carolan Clines, MD        PAST MEDICAL HISTORY: Past Medical History:  Diagnosis Date   ADHD (attention deficit hyperactivity  disorder)    Allergy    trees/pollen, mold, fungus, dust mites. Takes allergy shots   Arthritis    PAIN AND OA LEFT HIP   Asthma    allergist Dr Olena Heckle- monthly allergy injections   Autonomic dysfunction    CENTRAL NERVOUS SYSTEM NEUROPATHY - DX BY DR. Erling Cruz MORE THAN 10 YRS AGO - AND IT IS FELT TO CONTRIBUTE TO THE AUTOMIC  DYSFUNCTION-- PT HAS NUMBNESS LEGS AND FEET AND SOMETIIMES TIPS OF FINGER, SEVERE CONSTIPATION( NO SENSATION TO HAVE BM ), DOUBLE VISION, ORTHOSTATIC HYPOTENSION   Blood transfusion    Breast cancer ([place])  right side   Bruises easily    Cataract    Chest pain    a. 12/2012 Cath: nl cors, EF 55-65%.   Complication of anesthesia    reaction to some anesthetics/ 7/12 anesth record on chart- states prefers epidural   CVA (cerebrovascular accident) ([place]) 03/21/2018   Depression    Diverticulosis    Dysrhythmia    HX OF HIGH GRADE HEART BLOCK - REQUIRED PACEMAKER INSERTION   Esophageal stricture    Gastritis    GERD (gastroesophageal reflux disease)    H/O hiatal hernia    Hemorrhoids    Hernia of abdominal wall    spigelian hernia RLQ - SURGERY TO REPAIR   Hypothyroidism    Interstitial cystitis    Latex allergy, contact dermatitis    Mallory - Weiss tear    HEALED    Neuromuscular disorder ([facility])    central nervous system neuropathy- seen per Dr Erling Cruz   Pacemaker    Pericardial effusion    a. 12/2012 following ppm placement;  b. 01/01/2013 Echo: EF 55-60%, small pericardial effusion w/o RV collapse-->No need for tap/window.   PONV (postoperative nausea and vomiting)    pt needs scop patch   Recurrent upper respiratory infection (URI) 1/13- to present   bronchitis following surgery- states improved but still with cough. OV with [facility] Dr [name] 09/06/11 on chart   Shortness of breath    AT TIMES - BUT [place].   Symptomatic bradycardia    a. 12/2012 s/p MDT dual chamber PPM, ser # [id number]; b. 12/2012 post-op course  complicated by pericardial effusinon req lead revision.   Thyroid disease     PAST SURGICAL HISTORY: Past Surgical History:  Procedure Laterality Date   ABDOMINAL HYSTERECTOMY  1974   BACK SURGERY     cervical fusion 4-5 with plate   BLADDER SUSPENSION     BREAST BIOPSY  2002   NO BLOOD PRESSURES ON RIGHT SIDE/   s/p  axillary node dissection   CYSTO WITH HYDRODISTENSION  09/07/2011   Procedure: CYSTOSCOPY/HYDRODISTENSION;  Surgeon: Ailene Rud, MD;  Location: [facility];  Service: Urology;  Laterality: N/A;  INSTILLATION OF MARCAINE/PYRIDIUM INSTILLATION OF MARCAINE/KENALOG   CYSTOSCOPY  1975, 2007   EYE SURGERY     LASIK EYE SURGERY BILATERAL   LAPAROSCOPY  1973   LEAD REVISION N/A 12/30/2012   Procedure: LEAD REVISION;  Surgeon: Evans Lance, MD;  Location: [facility] CATH LAB;  Service: Cardiovascular;  Laterality: N/A;   LEFT HEART CATHETERIZATION WITH CORONARY ANGIOGRAM N/A 12/29/2012   Procedure: LEFT HEART CATHETERIZATION WITH CORONARY ANGIOGRAM;  Surgeon: Peter M Martinique, MD;  Location[facility] C[facility];  Service: Cardiovascular;  Laterality: N/A;   MASTECTOMY MODIFIED RADICAL     right; with immediate reconstruction   MYRINGOPLASTY  1962   NECK SURGERY     c4-5 ruptured disc   OOPHORECTOMY  1982   PACEMAKER INSERTION     PERMANENT PACEMAKER INSERTION N/A 12/29/2012   Procedure: PERMANENT PACEMAKER INSERTION;  Surgeon: Deboraha Sprang, MD;  Location: [facility] CATH LAB;  Service: Cardiovascular;  Laterality: N/A;   RECTOCELE REPAIR     with cystocele repair   TONSILLECTOMY     TOTAL HIP ARTHROPLASTY Right    TOTAL HIP ARTHROPLASTY Left 06/12/2013   Procedure: LEFT TOTAL HIP ARTHROPLASTY ANTERIOR APPROACH;  Surgeon: Mcarthur Rossetti, MD;  Location: [facility];  Service: Orthopedics;  Laterality: Left;   TYMPANOPLASTY  1973   right  ULNAR NERVE TRANSPOSITION Right    VENTRAL HERNIA REPAIR  05/30/2011   Procedure: HERNIA REPAIR VENTRAL ADULT;  Surgeon: Haywood Lasso, MD;   Location: [facility];  Service: General;  Laterality: Right;  repair right spigelian hernia    FAMILY HISTORY: Family History  Problem Relation Age of Onset   Heart disease Father    Colon cancer Mother 12   Depression Mother    Pancreatic cancer Sister 32   Diverticulitis Sister    Uterine cancer Maternal Grandmother    Heart disease Brother    Heart disease Paternal Grandmother    Heart disease Paternal Grandfather    Throat cancer Maternal Uncle    Heart disease Paternal Aunt    Heart disease Paternal Uncle    Heart disease Brother    Thrombosis Maternal Aunt    Cancer Maternal Uncle        NOS   Diabetes Other        aunt    SOCIAL HISTORY: Social History   Socioeconomic History   Marital status: Married    Spouse name: Not on file   Number of children: 2   Years of education: 15   Highest education level: Not on file  Occupational History   Occupation: Retired    Fish farm manager: [place]: [employer]    Employer: [employer]  Tobacco Use   Smoking status: Never   Smokeless tobacco: Never  Vaping Use   Vaping Use: Never used  Substance and Sexual Activity   Alcohol use: Yes    Alcohol/week: 7.0 standard drinks of alcohol    Types: 7 Glasses of wine per week    Comment: occasional   Drug use: No   Sexual activity: Not on file  Other Topics Concern   Not on file  Social History Narrative   Lives at home w/ her husband   Patient drinks 1-2 cup of caffeine daily.   Patient is right handed.   Social Determinants of Health   Financial Resource Strain: Not on file  Food Insecurity: Not on file  Transportation Needs: Not on file  Physical Activity: Not on file  Stress: Not on file  Social Connections: Not on file  Intimate Partner Violence: Not on file      Marcial Pacas, M.D. Ph.D.  [facility] [address], [place], [place] [address] Ph: [phone] Fax: [phone]  CC:  Burnard Bunting, MD [place],   [id number]  Burnard Bunting, MD

## 2021-12-28 ENCOUNTER — Telehealth: Payer: Self-pay | Admitting: Neurology

## 2021-12-28 LAB — THYROID PANEL WITH TSH
Free Thyroxine Index: 1.6 (ref 1.2–4.9)
T3 Uptake Ratio: 25 % (ref 24–39)
T4, Total: 6.4 ug/dL (ref 4.5–12.0)
TSH: 8.49 u[IU]/mL — ABNORMAL HIGH (ref 0.450–4.500)

## 2021-12-28 LAB — CK: Total CK: 88 U/L (ref 32–182)

## 2021-12-28 LAB — SEDIMENTATION RATE: Sed Rate: 2 mm/hr (ref 0–40)

## 2021-12-28 LAB — VITAMIN D 25 HYDROXY (VIT D DEFICIENCY, FRACTURES): Vit D, 25-Hydroxy: 26.2 ng/mL — ABNORMAL LOW (ref 30.0–100.0)

## 2021-12-28 LAB — VITAMIN B12: Vitamin B-12: 439 pg/mL (ref 232–1245)

## 2021-12-28 LAB — ANA W/REFLEX IF POSITIVE: Anti Nuclear Antibody (ANA): NEGATIVE

## 2021-12-28 LAB — C-REACTIVE PROTEIN: CRP: <1 mg/L (ref 0–10)

## 2021-12-28 LAB — HIGH SENSITIVITY CRP: CRP, High Sensitivity: 0.68 mg/L (ref 0.00–3.00)

## 2021-12-28 NOTE — Telephone Encounter (Signed)
medicare/tricare NPR sent to [facility]

## 2022-01-03 ENCOUNTER — Ambulatory Visit (HOSPITAL_COMMUNITY)
Admission: RE | Admit: 2022-01-03 | Discharge: 2022-01-03 | Disposition: A | Discharge status: Home / Self Care | Payer: Medicare Other | Source: Ambulatory Visit | Attending: Neurology | Admitting: Neurology

## 2022-01-03 DIAGNOSIS — E678 Other specified hyperalimentation: Secondary | ICD-10-CM | POA: Insufficient documentation

## 2022-01-03 DIAGNOSIS — M791 Myalgia, unspecified site: Secondary | ICD-10-CM | POA: Insufficient documentation

## 2022-01-03 DIAGNOSIS — I6312 Cerebral infarction due to embolism of basilar artery: Secondary | ICD-10-CM | POA: Diagnosis not present

## 2022-01-03 DIAGNOSIS — M5416 Radiculopathy, lumbar region: Secondary | ICD-10-CM | POA: Insufficient documentation

## 2022-01-03 DIAGNOSIS — R270 Ataxia, unspecified: Secondary | ICD-10-CM | POA: Diagnosis not present

## 2022-01-03 DIAGNOSIS — M4322 Fusion of spine, cervical region: Secondary | ICD-10-CM | POA: Diagnosis not present

## 2022-01-03 DIAGNOSIS — R269 Unspecified abnormalities of gait and mobility: Secondary | ICD-10-CM | POA: Diagnosis not present

## 2022-01-03 DIAGNOSIS — M47812 Spondylosis without myelopathy or radiculopathy, cervical region: Secondary | ICD-10-CM | POA: Diagnosis not present

## 2022-01-03 DIAGNOSIS — M4312 Spondylolisthesis, cervical region: Secondary | ICD-10-CM | POA: Diagnosis not present

## 2022-01-07 ENCOUNTER — Telehealth: Payer: Self-pay | Admitting: Neurology

## 2022-01-07 DIAGNOSIS — M79604 Pain in right leg: Secondary | ICD-10-CM | POA: Insufficient documentation

## 2022-01-07 NOTE — Progress Notes (Signed)
EMG is under procedure tab 

## 2022-01-07 NOTE — Telephone Encounter (Signed)
Open in error

## 2022-01-07 NOTE — Procedures (Signed)
Full Name: Loella Hickle Gender: Female MRN #: [id number] Date of Birth: 09-14-1945    Visit Date: 12/27/2021 10:30 Age: 76 Years Examining Physician: Marcial Pacas Referring Physician: Marcial Pacas Height: 5 feet 7 inch History: 76 year old female complains of left lower extremity pain, radiating pain to left lower extremity  Summary of the test:  Nerve conduction study:  Left sural, bilateral superficial peroneal sensory responses were absent.  Right sural sensory response were within normal limits  Bilateral tibial motor response showed moderately decreased CMAP amplitude.  Bilateral peroneal motor responses showed no significant abnormality with exception of slight decreased CMAP amplitude at the left peroneal to EDB motor response  Electromyography:  Selected needle examinations were performed at bilateral lower extremity muscles, lumbosacral paraspinal muscles, and right upper extremity muscles.  There was no significant abnormality found on needle examinations.    Conclusion: There is no significant abnormality found at today's examination.  Left lower extremity nerve conduction study examination is limited by her left lower extremity edema, there is no evidence of significant large fiber peripheral neuropathy, there is no active bilateral lumbosacral radiculopathy, there is no evidence of intrinsic muscle disease.   Marcial Pacas, M.D. ph.D.  [facility] [address] [place], [place] [address] Tel: [phone] Fax: [phone]  Verbal informed consent was obtained from the patient, patient was informed of potential risk of procedure, including bruising, bleeding, hematoma formation, infection, muscle weakness, muscle pain, numbness, among others.        [place]    Nerve / Sites Muscle Latency Ref. Amplitude Ref. Rel Amp Segments Distance Velocity Ref. Area    ms ms mV mV %  cm m/s m/s mVms  R Peroneal - EDB     Ankle EDB 4.8 ?6.5  2.0 ?2.0 100 Ankle - EDB 9   5.8     Fib head EDB 12.5  2.0  101 Fib head - Ankle 33 43 ?44 6.3     Pop fossa EDB 14.8  2.0  97.4 Pop fossa - Fib head 10 44 ?44 6.3         Pop fossa - Ankle      L Peroneal - EDB     Ankle EDB 5.8 ?6.5 1.7 ?2.0 100 Ankle - EDB 9   6.1     Fib head EDB 12.2  1.7  99.6 Fib head - Ankle 29 45 ?44 6.2     Pop fossa EDB 14.5  1.8  104 Pop fossa - Fib head 10 45 ?44 6.4         Pop fossa - Ankle      R Tibial - AH     Ankle AH 5.5 ?5.8 2.8 ?4.0 100 Ankle - AH 9   7.1     Pop fossa AH 16.3  2.4  86.7 Pop fossa - Ankle 44 41 ?41 4.2  L Tibial - AH     Ankle AH 7.9 ?5.8 1.7 ?4.0 100 Ankle - AH 9   3.9     Pop fossa AH 16.0  1.2  73.4 Pop fossa - Ankle 45 55 ?41 3.7             SNC    Nerve / Sites Rec. Site Peak Lat Ref.  Amp Ref. Segments Distance    ms ms V V  cm  R Sural - Ankle (Calf)     Calf Ankle 4.2 ?4.4 7 ?6 Calf - Ankle 14  L Sural - Ankle (Calf)     Calf Ankle NR ?4.4 NR ?6 Calf - Ankle 14  R Superficial peroneal - Ankle     Lat leg Ankle NR ?4.4 NR ?6 Lat leg - Ankle 14  L Superficial peroneal - Ankle     Lat leg Ankle NR ?4.4 NR ?6 Lat leg - Ankle 14             F  Wave    Nerve F Lat Ref.   ms ms  R Tibial - AH 58.8 ?56.0  L Tibial - AH 62.4 ?56.0         EMG Summary Table    Spontaneous MUAP Recruitment  Muscle IA Fib PSW Fasc Other Amp Dur. Poly Pattern  R. Biceps brachii Normal None None None _______ Normal Normal Normal Normal  R. Deltoid Normal None None None _______ Normal Normal Normal Normal  R. Triceps brachii Normal None None None _______ Normal Normal Normal Normal  R. Tibialis anterior Normal None None None _______ Normal Normal Normal Normal  R. Tibialis posterior Normal None None None _______ Normal Normal Normal Normal  R. Peroneus longus Normal None None None _______ Normal Normal Normal Normal  R. Gastrocnemius (Medial head) Normal None None None _______ Normal Normal Normal Normal  R. Vastus lateralis Normal None  None None _______ Normal Normal Normal Normal  L. Tibialis anterior Normal None None None _______ Normal Normal Normal Normal  L. Tibialis posterior Normal None None None _______ Normal Normal Normal Normal  L. Peroneus longus Normal None None None _______ Normal Normal Normal Normal  L. Gastrocnemius (Medial head) Normal None None None _______ Normal Normal Normal Normal  L. Vastus lateralis Normal None None None _______ Normal Normal Normal Normal  R. Lumbar paraspinals (low) Normal None None None _______ Normal Normal Normal Normal  R. Lumbar paraspinals (mid) Normal None None None _______ Normal Normal Normal Normal  L. Lumbar paraspinals (low) Normal None None None _______ Normal Normal Normal Normal  L. Lumbar paraspinals (mid) Normal None None None _______ Normal Normal Normal Normal

## 2022-01-11 DIAGNOSIS — H5111 Convergence insufficiency: Secondary | ICD-10-CM | POA: Diagnosis not present

## 2022-01-11 DIAGNOSIS — H5581 Saccadic eye movements: Secondary | ICD-10-CM | POA: Diagnosis not present

## 2022-01-11 DIAGNOSIS — H524 Presbyopia: Secondary | ICD-10-CM | POA: Diagnosis not present

## 2022-01-12 DIAGNOSIS — Z7952 Long term (current) use of systemic steroids: Secondary | ICD-10-CM | POA: Diagnosis not present

## 2022-01-16 ENCOUNTER — Encounter: Payer: Self-pay | Admitting: Neurology

## 2022-01-16 ENCOUNTER — Ambulatory Visit (INDEPENDENT_AMBULATORY_CARE_PROVIDER_SITE_OTHER): Payer: Medicare Other | Admitting: Neurology

## 2022-01-16 VITALS — BP 136/83 | HR 64 | Ht 67.0 in | Wt 188.0 lb

## 2022-01-16 DIAGNOSIS — R269 Unspecified abnormalities of gait and mobility: Secondary | ICD-10-CM

## 2022-01-16 DIAGNOSIS — M791 Myalgia, unspecified site: Secondary | ICD-10-CM

## 2022-01-16 MED ORDER — DULOXETINE HCL 30 MG PO CPEP: 30.0000 mg | ORAL_CAPSULE | Freq: Every day | ORAL | 3 refills | Status: DC

## 2022-01-16 NOTE — Progress Notes (Signed)
ASSESSMENT AND PLAN  [name] is a 76 y.o. female   Worsening gait abnormality Bilateral lower extremity deep achy pain  EMG nerve conduction study today showed no evidence of active lumbar radiculopathy to explain her pain and subjective weakness gait abnormality,  She has significant tenderness upon deep palpitation at proximal upper and lower extremity muscles,  CT myelogram of lumbar spine showed multilevel degenerative changes without definite neural foraminal narrowing,  CT of left hip showed left total hip arthroplasty without hardware failure or complication  Laboratory evaluation, inflammatory markers, including C-reactive protein, ESR, no significant abnormality  CT cervical spine to show previous ACDF C5-6, C 6 7, no evidence of cord or foraminal stenosis  Her current gait abnormality most related to her diffuse muscle achy pain especially at the bilateral lateral thigh area, no evidence of structural abnormality or muscle weakness, encouraged her to gradually increase mobility  Referral to physical therapy  Start Cymbalta 30 mg daily, has been on Lexapro 10 mg daily for long-term for previous diagnosis of depression, denies depression, suggest for slow taper off Lexapro 5 mg for 1 week then stop, before she is starting Cymbalta,  Call clinic for new issues ,   DIAGNOSTIC DATA (LABS, IMAGING, TESTING) - I reviewed patient records, labs, notes, testing and imaging myself where available.   MEDICAL HISTORY:  JAIMEE Reynolds is a 76 year old female, seen in request by neurosurgeon Dr. Kristeen Miss, MD for evaluation of low back pain, leg weakness, her primary care physician is Dr. Burnard Bunting, initial evaluation was on November 10, 2021  I reviewed and summarized the referring note.PMHX. S/p pacemaker. Histroy of right cervical radiculopathy, surgery did help.   In October 2022, after lifting a heavy box of books, she noticed low back pain, muscle spasm  and lower back pain  She was initially treated with NSAIDs, muscle relaxant with some help  But since then, with prolonged standing, bending over she will develop low back pain, then was given steroid, physical therapy,  She had a history of bilateral hip replacement,  Over the past few months, she developed worsening low back pain, radiating pain to left lateral thigh region, she received injection for left hip bursitis, did not help her symptoms  CT of left hip in February 2023 showed total arthroplasty without hardware failure or complication  CT myelogram October 25, 2021 showed lumbar region disc bulging, facet osteoarthritis with mild narrowing of lateral recess at L3-4, L4-5 without definite neural compression,  CT thoracic spine showed chronic compression deformity of T3 no significant canal stenosis  She now complains of significant left lower extremity weakness, when she lying down sitting down no significant pain, when she getting up she complains of significant left leg pain 9 out of 10, radiating to the left lateral leg, deep aching pain at nighttime, has to take frequent over-the-counter medications  UPDATE August 16th 2023: Patient is accompanied by her husband return for electrodiagnostic study today, which showed evidence of mild peripheral neuropathy if anything, the test is also limited due to her lower extremity pitting edema, relative preserved distal lower extremity motor nerve conduction study  EMG nerve conduction study showed no evidence of active lumbar radiculopathy  On further questioning, today she mainly complains of subacute onset of bilateral lower extremity deep achy pain, mainly involving left lateral thigh,  now similar involvement of the right side, also complains of low back pain, upper extremity pain  Initially she was treated with tapering  dose of the steroid for 1 week, she did reported significant improvement afterwards, benefit last for more than 4  weeks, but she could not tolerate the side effect of high-dose of prednisone after 60 mg daily,  She later had few sessions of epidural injection without any benefit  Today she also complains of worsening urinary urgency, to the point of incontinence,  She did have a history of cervical decompression surgery in the past, for severe right cervical radiculopathy, denied gait abnormality prior to her cervical decompression surgery  UPDATE Sept 5th 2023: She is accompanied by her husband at today's clinical visit, continues to complains of moderate left lateral thigh pain, right thigh pain, gait abnormality mainly due to limitation from pain  Reviewed laboratory evaluations normal high-sensitivity C-reactive protein ESR, CPK, B12, ANA, slightly low vitamin D 26.2  Reviewed home medication list, she has been on Lexapro 10 mg since age 55, she does suffer depression following total hysterectomy, she denies depression,  Personally reviewed CT cervical spine August 2023, status post ACDF C5-6 C6-7, no significant canal foraminal narrowing  CT lumbar myelogram October 25, 2021, multilevel degenerative changes, most obvious degenerative changes worse at L4-5, facet and ligamentous hypertrophy, mild stenosis of lateral recess but without definite neural compression  Husband revealed that she has become very sedentary since 2022, likely deconditioned  PHYSICAL EXAM:   PHYSICAL EXAMNIATION: Vitals:   01/16/22 1327  Weight: 188 lb (85.3 kg)  Height: _0  (1.702 m)     Gen: NAD, conversant, well nourised, well groomed                     Cardiovascular: Regular rate rhythm, no peripheral edema, warm, nontender. Eyes: Conjunctivae clear without exudates or hemorrhage Neck: Supple, no carotid bruits.   NEUROLOGICAL EXAM:  MENTAL STATUS: Speech/cognition: Awake, alert, oriented to history taking and casual conversation CRANIAL NERVES: CN II: Visual fields are full to confrontation. Pupils are  round equal and briskly reactive to light. CN III, IV, VI: extraocular movement are normal. No ptosis. CN V: Facial sensation is intact to light touch CN VII: Face is symmetric with normal eye closure  CN VIII: Hearing is normal to causal conversation. CN IX, X: Phonation is normal. CN XI: Head turning and shoulder shrug are intact  MOTOR: There was no significant bilateral upper and lower extremity proximal and distal muscle weakness noted  REFLEXES: Reflexes are trace and symmetric at the biceps, triceps, knees, and ankles. Plantar responses are flexor.  SENSORY: Decreased vibratory sensation to below knee level, decreased to pinprick to mid shin level,  COORDINATION: There is no trunk or limb dysmetria noted.  GAIT/STANCE: She needs push-up to get up from seated position, antalgic, cautious, able to stand up on tiptoes and heels  REVIEW OF SYSTEMS:  Full 14 system review of systems performed and notable only for as above All other review of systems were negative.   ALLERGIES: Allergies  Allergen Reactions   Anesthetics, Amide Other (See Comments)    Patient unsure of names, however multiples cause swelling of airway & nausea   Clindamycin Swelling   Fentanyl Anaphylaxis   Shellfish Allergy Other (See Comments)    Neurotoxic reaction   Sulfonamide Derivatives Anaphylaxis and Swelling   Neomycin Swelling   Zolpidem Tartrate Other (See Comments)    Jerking motions    Azithromycin Other (See Comments)    Severe gastritis    Bactroban Other (See Comments)    Causes sores in nose  Ciprofloxacin     joint swelling   Codeine Swelling   Meperidine Hcl Nausea Only    Hallucinations    Morphine Nausea Only    Hallucinations    Neosporin [Neomycin-Polymyxin-Gramicidin] Hives and Dermatitis    All topical "orin's ointment"   Nitrofurantoin     neuropathy in legs   Other     ALLERGIC TO WARM WATER SHELLFISH     Penicillins Other (See Comments)    Swelling in joints    Percocet [Oxycodone-Acetaminophen]     Dizziness    Tramadol     Makes jerk   Latex Rash    HOME MEDICATIONS: Current Outpatient Medications  Medication Sig Dispense Refill   acetaminophen (TYLENOL) 500 MG tablet Take 1,000 mg by mouth 2 (two) times daily.     albuterol (PROVENTIL HFA;VENTOLIN HFA) 108 (90 Base) MCG/ACT inhaler Inhale 2 puffs into the lungs every 6 (six) hours as needed. For shortness of breath (Patient taking differently: Inhale 2 puffs into the lungs every 6 (six) hours as needed for wheezing or shortness of breath.) 1 Inhaler 1   aspirin EC 81 MG EC tablet Take 1 tablet (81 mg total) by mouth daily.     budesonide (ENTOCORT EC) 3 MG 24 hr capsule Take 3 mg by mouth daily.     calcium carbonate (OSCAL) 1500 (600 Ca) MG TABS tablet Take 600 mg of elemental calcium by mouth daily.     cetirizine (ZYRTEC) 10 MG tablet Take 10 mg by mouth 2 (two) times daily.     cholecalciferol (VITAMIN D) 1000 UNITS tablet Take 1,000 Units by mouth daily.     cycloSPORINE (RESTASIS) 0.05 % ophthalmic emulsion Place 1 drop into both eyes 2 (two) times daily.     diclofenac Sodium (VOLTAREN) 1 % GEL Apply 1 application  topically daily as needed (pain).     escitalopram (LEXAPRO) 10 MG tablet Take 10 mg by mouth daily.     esomeprazole (NEXIUM) 20 MG capsule Take 20 mg by mouth 2 (two) times daily.     estradiol (ESTRING) 2 MG vaginal ring Place 2 mg vaginally every 3 (three) months. follow package directions     fludrocortisone (FLORINEF) 0.1 MG tablet TAKE 1 TABLET TWICE A DAY (MUST KEEP UPCOMING APPOINTMENT IN JANUARY 2023 WITH DR. Caryl Comes BEFORE ANYMORE REFILLS) (Patient taking differently: Take 0.1 mg by mouth daily.) 180 tablet 3   gabapentin (NEURONTIN) 300 MG capsule Take 2 capsules (600 mg total) by mouth 3 (three) times daily. 180 capsule 3   levothyroxine (SYNTHROID) 50 MCG tablet Take 50 mcg by mouth daily before breakfast.     Magnesium 250 MG TABS Take 250 mg by mouth daily.      meclizine (ANTIVERT) 25 MG tablet Take 25 mg by mouth 3 (three) times daily as needed for dizziness.      Melatonin 1 MG TABS Take 1 tablet by mouth at bedtime.      ondansetron (ZOFRAN) 4 MG tablet Take 4 mg by mouth every 8 (eight) hours as needed for nausea.      potassium chloride (KLOR-CON) 10 MEQ tablet TAKE 1 TABLET DAILY 90 tablet 3   Probiotic Product (ALIGN) 4 MG CAPS Take 4 mg by mouth at bedtime.      rosuvastatin (CRESTOR) 10 MG tablet Take 1 tablet (10 mg total) by mouth daily. (Patient taking differently: Take 10 mg by mouth at bedtime.) 90 tablet 0   vitamin B-12 (CYANOCOBALAMIN) 500 MCG tablet Take 500  mcg by mouth daily.     No current facility-administered medications for this visit.   Facility-Administered Medications Ordered in Other Visits  Medication Dose Route Frequency Provider Last Rate Last Admin   bupivacaine (MARCAINE) 0.5 % 10 mL, triamcinolone acetonide (KENALOG-40) 40 mg injection   Subcutaneous Once Carolan Clines, MD       bupivacaine (MARCAINE) 0.5 % 15 mL, phenazopyridine (PYRIDIUM) 400 mg bladder mixture   Bladder Instillation Once Carolan Clines, MD        PAST MEDICAL HISTORY: Past Medical History:  Diagnosis Date   ADHD (attention deficit hyperactivity disorder)    Allergy    trees/pollen, mold, fungus, dust mites. Takes allergy shots   Arthritis    PAIN AND OA LEFT HIP   Asthma    allergist Dr Olena Heckle- monthly allergy injections   Autonomic dysfunction    CENTRAL NERVOUS SYSTEM NEUROPATHY - DX BY DR. Erling Cruz MORE THAN 10 YRS AGO - AND IT IS FELT TO CONTRIBUTE TO THE AUTOMIC  DYSFUNCTION-- PT HAS NUMBNESS LEGS AND FEET AND SOMETIIMES TIPS OF FINGER, SEVERE CONSTIPATION( NO SENSATION TO HAVE BM ), DOUBLE VISION, ORTHOSTATIC HYPOTENSION   Blood transfusion    Breast cancer ([place])    right side   Bruises easily    Cataract    Chest pain    a. 12/2012 Cath: nl cors, EF 55-65%.   Complication of anesthesia    reaction to some anesthetics/ 7/12  anesth record on chart- states prefers epidural   CVA (cerebrovascular accident) ([place]) 03/21/2018   Depression    Diverticulosis    Dysrhythmia    HX OF HIGH GRADE HEART BLOCK - REQUIRED PACEMAKER INSERTION   Esophageal stricture    Gastritis    GERD (gastroesophageal reflux disease)    H/O hiatal hernia    Hemorrhoids    Hernia of abdominal wall    spigelian hernia RLQ - SURGERY TO REPAIR   Hypothyroidism    Interstitial cystitis    Latex allergy, contact dermatitis    Mallory - Weiss tear    HEALED    Neuromuscular disorder (HCC)    central nervous system neuropathy- seen per Dr Erling Cruz   Pacemaker    Pericardial effusion    a. 12/2012 following ppm placement;  b. 01/01/2013 Echo: EF 55-60%, small pericardial effusion w/o RV collapse-->No need for tap/window.   PONV (postoperative nausea and vomiting)    pt needs scop patch   Recurrent upper respiratory infection (URI) 1/13- to present   bronchitis following surgery- states improved but still with cough. OV with [facility] Dr [name] 09/06/11 on chart   Shortness of breath    AT TIMES - BUT [place].   Symptomatic bradycardia    a. 12/2012 s/p MDT dual chamber PPM, ser # [id number]; b. 12/2012 post-op course complicated by pericardial effusinon req lead revision.   Thyroid disease     PAST SURGICAL HISTORY: Past Surgical History:  Procedure Laterality Date   ABDOMINAL HYSTERECTOMY  1974   BACK SURGERY     cervical fusion 4-5 with plate   BLADDER SUSPENSION     BREAST BIOPSY  2002   NO BLOOD PRESSURES ON RIGHT SIDE/   s/p  axillary node dissection   CYSTO WITH HYDRODISTENSION  09/07/2011   Procedure: CYSTOSCOPY/HYDRODISTENSION;  Surgeon: Ailene Rud, MD;  Location: [facility];  Service: Urology;  Laterality: N/A;  INSTILLATION OF MARCAINE/PYRIDIUM INSTILLATION OF MARCAINE/KENALOG   CYSTOSCOPY  1975, 2007   EYE SURGERY  LASIK EYE SURGERY BILATERAL   LAPAROSCOPY  1973   LEAD REVISION  N/A 12/30/2012   Procedure: LEAD REVISION;  Surgeon: Evans Lance, MD;  Location[facility] CATH LAB;  Service: Cardiovascular;  Laterality: N/A;   LEFT HEART CATHETERIZATION WITH CORONARY ANGIOGRAM N/A 12/29/2012   Procedure: LEFT HEART CATHETERIZATION WITH CORONARY ANGIOGRAM;  Surgeon: Peter M Martinique, MD;  Location: [facility] CATH LAB;  Service: Cardiovascular;  Laterality: N/A;   MASTECTOMY MODIFIED RADICAL     right; with immediate reconstruction   MYRINGOPLASTY  1962   NECK SURGERY     c4-5 ruptured disc   OOPHORECTOMY  1982   PACEMAKER INSERTION     PERMANENT PACEMAKER INSERTION N/A 12/29/2012   Procedure: PERMANENT PACEMAKER INSERTION;  Surgeon: Deboraha Sprang, MD;  Location: [facility] CATH LAB;  Service: Cardiovascular;  Laterality: N/A;   RECTOCELE REPAIR     with cystocele repair   TONSILLECTOMY     TOTAL HIP ARTHROPLASTY Right    TOTAL HIP ARTHROPLASTY Left 06/12/2013   Procedure: LEFT TOTAL HIP ARTHROPLASTY ANTERIOR APPROACH;  Surgeon: Mcarthur Rossetti, MD;  Location: [facility];  Service: Orthopedics;  Laterality: Left;   TYMPANOPLASTY  1973   right   ULNAR NERVE TRANSPOSITION Right    VENTRAL HERNIA REPAIR  05/30/2011   Procedure: HERNIA REPAIR VENTRAL ADULT;  Surgeon: Haywood Lasso, MD;  Location: [facility];  Service: General;  Laterality: Right;  repair right spigelian hernia    FAMILY HISTORY: Family History  Problem Relation Age of Onset   Heart disease Father    Colon cancer Mother 71   Depression Mother    Pancreatic cancer Sister 81   Diverticulitis Sister    Uterine cancer Maternal Grandmother    Heart disease Brother    Heart disease Paternal Grandmother    Heart disease Paternal Grandfather    Throat cancer Maternal Uncle    Heart disease Paternal Aunt    Heart disease Paternal Uncle    Heart disease Brother    Thrombosis Maternal Aunt    Cancer Maternal Uncle        NOS   Diabetes Other        aunt    SOCIAL HISTORY: Social History    Socioeconomic History   Marital status: Married    Spouse name: Not on file   Number of children: 2   Years of education: 15   Highest education level: Not on file  Occupational History   Occupation: Retired    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   Patient is right handed.   Social Determinants of Health   Financial Resource Strain: Not on file  Food Insecurity: Not on file  Transportation Needs: Not on file  Physical Activity: Not on file  Stress: Not on file  Social Connections: Not on  file  Intimate Partner Violence: Not on file      Marcial Pacas, M.D. Ph.D.  [facility] [address] [place], [place] [address] Ph: [phone] Fax: [phone]  CC:  Burnard Bunting, MD [place],  [place] [id number]  Burnard Bunting, MD    Total time spent reviewing the chart, obtaining history, examined patient, ordering tests, documentation, consultations and family, care coordination was 40 minutes

## 2022-01-19 ENCOUNTER — Encounter: Payer: Self-pay | Admitting: Neurology

## 2022-01-23 ENCOUNTER — Other Ambulatory Visit: Payer: Self-pay

## 2022-01-23 ENCOUNTER — Ambulatory Visit: Payer: Medicare Other | Attending: Neurology | Admitting: Physical Therapy

## 2022-01-23 ENCOUNTER — Encounter: Payer: Self-pay | Admitting: Physical Therapy

## 2022-01-23 VITALS — BP 130/69 | HR 62

## 2022-01-23 DIAGNOSIS — R2681 Unsteadiness on feet: Secondary | ICD-10-CM | POA: Diagnosis not present

## 2022-01-23 DIAGNOSIS — M791 Myalgia, unspecified site: Secondary | ICD-10-CM | POA: Insufficient documentation

## 2022-01-23 DIAGNOSIS — M6281 Muscle weakness (generalized): Secondary | ICD-10-CM

## 2022-01-23 DIAGNOSIS — R2689 Other abnormalities of gait and mobility: Secondary | ICD-10-CM | POA: Diagnosis not present

## 2022-01-23 DIAGNOSIS — R269 Unspecified abnormalities of gait and mobility: Secondary | ICD-10-CM | POA: Diagnosis not present

## 2022-01-23 MED ORDER — DULOXETINE HCL 30 MG PO CPEP: 30.0000 mg | ORAL_CAPSULE | Freq: Every day | ORAL | 1 refills | Status: DC

## 2022-01-23 NOTE — Therapy (Signed)
OUTPATIENT PHYSICAL THERAPY NEURO EVALUATION   Patient Name: [name] MRN: [id number] DOB:Apr 05, 1946, 76 y.o., female Today's Date: 01/23/2022   PCP: Burnard Bunting, MD REFERRING PROVIDER: Marcial Pacas, MD    PT End of Session - 01/23/22 1201     Visit Number 1    Number of Visits 17   16 + eval   Date for PT Re-Evaluation 04/06/22   pushed out due to pt OOT for 2 weeks following eval.   Authorization Type MEDICARE PART A AND B    PT Start Time 1150    PT Stop Time 1238    PT Time Calculation (min) 48 min    Equipment Utilized During Treatment Gait belt    Activity Tolerance Patient tolerated treatment well    Behavior During Therapy WFL for tasks assessed/performed             Past Medical History:  Diagnosis Date   ADHD (attention deficit hyperactivity disorder)    Allergy    trees/pollen, mold, fungus, dust mites. Takes allergy shots   Arthritis    PAIN AND OA LEFT HIP   Asthma    allergist Dr Olena Heckle- monthly allergy injections   Autonomic dysfunction    CENTRAL NERVOUS SYSTEM NEUROPATHY - DX BY DR. Erling Cruz MORE THAN 10 YRS AGO - AND IT IS FELT TO CONTRIBUTE TO THE AUTOMIC  DYSFUNCTION-- PT HAS NUMBNESS LEGS AND FEET AND SOMETIIMES TIPS OF FINGER, SEVERE CONSTIPATION( NO SENSATION TO HAVE BM ), DOUBLE VISION, ORTHOSTATIC HYPOTENSION   Blood transfusion    Breast cancer ([place])    right side   Bruises easily    Cataract    Chest pain    a. 12/2012 Cath: nl cors, EF 55-65%.   Complication of anesthesia    reaction to some anesthetics/ 7/12 anesth record on chart- states prefers epidural   CVA (cerebrovascular accident) ([place]) 03/21/2018   Depression    Diverticulosis    Dysrhythmia    HX OF HIGH GRADE HEART BLOCK - REQUIRED PACEMAKER INSERTION   Esophageal stricture    Gastritis    GERD (gastroesophageal reflux disease)    H/O hiatal hernia    Hemorrhoids    Hernia of abdominal wall    spigelian hernia RLQ - SURGERY TO REPAIR   Hypothyroidism     Interstitial cystitis    Latex allergy, contact dermatitis    Mallory - Weiss tear    HEALED    Neuromuscular disorder (H[facility])    central nervous system neuropathy- seen per Dr Erling Cruz   Pacemaker    Pericardial effusion    a. 12/2012 following ppm placement;  b. 01/01/2013 Echo: EF 55-60%, small pericardial effusion w/o RV collapse-->No need for tap/window.   PONV (postoperative nausea and vomiting)    pt needs scop patch   Recurrent upper respiratory infection (URI) 1/13- to present   bronchitis following surgery- states improved but still with cough. OV with [facility] Dr [name] 09/06/11 on chart   Shortness of breath    AT TIMES - BUT [place].   Symptomatic bradycardia    a. 12/2012 s/p MDT dual chamber PPM, ser # [id number]; b. 12/2012 post-op course complicated by pericardial effusinon req lead revision.   Thyroid disease    Past Surgical History:  Procedure Laterality Date   ABDOMINAL HYSTERECTOMY  1974   BACK SURGERY     cervical fusion 4-5 with plate   BLADDER SUSPENSION     BREAST BIOPSY  2002  NO BLOOD PRESSURES ON RIGHT SIDE/   s/p  axillary node dissection   CYSTO WITH HYDRODISTENSION  09/07/2011   Procedure: CYSTOSCOPY/HYDRODISTENSION;  Surgeon: Ailene Rud, MD;  Location: [facility];  Service: Urology;  Laterality: N/A;  INSTILLATION OF MARCAINE/PYRIDIUM INSTILLATION OF MARCAINE/KENALOG   CYSTOSCOPY  1975, 2007   EYE SURGERY     LASIK EYE SURGERY BILATERAL   LAPAROSCOPY  1973   LEAD REVISION N/A 12/30/2012   Procedure: LEAD REVISION;  Surgeon: Evans Lance, MD;  Location: [facility] CATH LAB;  Service: Cardiovascular;  Laterality: N/A;   LEFT HEART CATHETERIZATION WITH CORONARY ANGIOGRAM N/A 12/29/2012   Procedure: LEFT HEART CATHETERIZATION WITH CORONARY ANGIOGRAM;  Surgeon: Peter M Martinique, MD;  Location: [facility] CATH LAB;  Service: Cardiovascular;  Laterality: N/A;   MASTECTOMY MODIFIED RADICAL     right; with immediate reconstruction    MYRINGOPLASTY  1962   NECK SURGERY     c4-5 ruptured disc   OOPHORECTOMY  1982   PACEMAKER INSERTION     PERMANENT PACEMAKER INSERTION N/A 12/29/2012   Procedure: PERMANENT PACEMAKER INSERTION;  Surgeon: Deboraha Sprang, MD;  Location: [facility] CATH LAB;  Service: Cardiovascular;  Laterality: N/A;   RECTOCELE REPAIR     with cystocele repair   TONSILLECTOMY     TOTAL HIP ARTHROPLASTY Right    TOTAL HIP ARTHROPLASTY Left 06/12/2013   Procedure: LEFT TOTAL HIP ARTHROPLASTY ANTERIOR APPROACH;  Surgeon: Mcarthur Rossetti, MD;  Location: [facility];  Service: Orthopedics;  Laterality: Left;   TYMPANOPLASTY  1973   right   ULNAR NERVE TRANSPOSITION Right    VENTRAL HERNIA REPAIR  05/30/2011   Procedure: HERNIA REPAIR VENTRAL ADULT;  Surgeon: Haywood Lasso, MD;  Location: [place];  Service: General;  Laterality: Right;  repair right spigelian hernia   Patient Active Problem List   Diagnosis Date Noted   Pain in both lower extremities 01/07/2022   Muscle ache 12/27/2021   Left lumbar radiculopathy 11/06/2021   Gait abnormality 11/06/2021   Trochanteric bursitis, left hip 05/04/2021   S/P Nissen fundoplication (without gastrostomy tube) procedure 03/01/2020   Pleuritic chest pain    Heart block AV complete ([place]) 05/19/2019   Chronic right shoulder pain 02/13/2017   Short Achilles tendon (acquired), left ankle 02/13/2017   Dizziness and giddiness 27/74/1287   Monoallelic mutation of CHEK2 gene 02/02/2015   Genetic testing 01/31/2015   Altered mental status 10/25/2014   Occipital neuralgia of left side    Cerebral infarction ([place]) brainstem s/p IV tPA (not seen on MRI) 10/07/2014   History of permanent cardiac pacemaker placement 10/07/2014   Cephalalgia    Complicated migraine    HLD (hyperlipidemia)    Headache 10/06/2014   Arthritis of left hip 06/12/2013   Status post THR (total hip replacement) 06/12/2013   Constipation 05/25/2013   Orthostatic hypotension  01/27/2013   crosstalk-atrial lead 01/27/2013   Pre-syncope 01/02/2013   Sinus tachycardia 01/01/2013   Second degree heart block 12/27/2012   Hypothyroidism 12/27/2012   Spigelian hernia 04/20/2011   DYSPNEA 05/09/2010   BREAST CANCER 05/05/2010   ASTHMA 05/05/2010   HIATAL HERNIA 05/05/2010   DEGENERATIVE Disk DISEASE 05/05/2010   OSTEOPENIA 05/05/2010    ONSET DATE: 01/16/2022   REFERRING DIAG: M79.10 (ICD-10-CM) - Muscle ache R26.9 (ICD-10-CM) - Gait abnormality   THERAPY DIAG:  Other abnormalities of gait and mobility  Muscle weakness (generalized)  Unsteadiness on feet  Rationale for Evaluation and Treatment Rehabilitation  SUBJECTIVE:  SUBJECTIVE STATEMENT: "It all started last September, I went first of all to my PCP.  He referred me to therapy for my back and did that for about 2 months up until almost Christmas.  I had to go up to [place] to my daughter, by the time I got up there I could barely walk up her stairs and now I can barely walk up any stairs.  It's been fairly progressive, when I came back to [facility] I saw an orthopedic MD who thought maybe is was left hip bursitis and gave me a Cortisone shot.  He then sent me for CT scans that did show abnormalities in my lower lumbar area.  Then after that we tried the injections into my back.  I went through a total series of 3 of those and it did nothing.  So then I saw Dr. Ellene Route and he really thought I had stenosis and I was kind of all prepared for back surgery.  Then he did the oh, I cannot remember all these terms, basically where he does the spinal tap and is able to look at the spinal cord.  He said there was not anything there he could operate on.  He thought it was neuromuscular and referred me to Dr. Krista Blue.  This has been going  on for a year!  She did the nerve conduction and the EMG, he did a myelogram, and she did not see nerve damage."  She further elaborates on PMH and concern for potential "residual damage" from brainstem stroke and relies on prior vestibular therapy here to keep going.  She used to shuffle her feet really badly, but does her prior exercises occasionally.  She furniture surfs ("walking the walls").  She admits fear of falling.  Pt accompanied by: self  PERTINENT HISTORY: right cervical radiculopathy w/ ACDF C5-6 C6-7, chronic compression deformity of T3 w/o canal stenosis, bilateral THA, lumbar DDD, osteopenia, second degree heart block w/ pacemaker (HR does not drop below 60bpm), hypothyroidism, orthostatic hypotension, CVA, HLD, depression, Right mastectomy   PAIN:  At rest pt is at 0/10.  Are you having pain? Yes: NPRS scale: 8-9/10 Pain location: left hip radiating to foot, right gluts, left greater trochanter Pain description: sharp, dull Aggravating factors: any movement particularly step ups Relieving factors: Gabapentin relieves the sharpness  PRECAUTIONS: Fall and ICD/Pacemaker  WEIGHT BEARING RESTRICTIONS No  FALLS: Has patient fallen in last 6 months? No  LIVING ENVIRONMENT: Lives with: lives with their spouse Lives in: House/apartment Stairs: Yes: Internal: 14 steps; on left going up and External: 2 (in garage-preferred entrance) steps; none Has following equipment at home: Single point cane, Walker - 2 wheeled, Electronics engineer, and Grab bars  PLOF: Independent  PATIENT GOALS "I'd love to be able to get back out to walking the trails in [place] and not losing my breath."  OBJECTIVE:   DIAGNOSTIC FINDINGS: General cervical and lumbar DDD, no relevant nerve findings.  COGNITION: Overall cognitive status: No family/caregiver present to determine baseline cognitive functioning and "some days I feel like I have more brain fog than  others."   SENSATION: WFL  COORDINATION: Bil Heel-to-shin:  WNL Bil LE RAMPS:  Impaired  EDEMA:  Bil LE non-pitting edema (Left mildly worse than right)-pt states MD is aware and cardiologist would like for her to wear compression stockings, but she does not currently wear any.  MUSCLE TONE: none noted in BLE  POSTURE: forward head  LOWER EXTREMITY ROM:     Active  Right Eval Left Eval  Hip flexion [facility]  Hip extension    Hip abduction " "  Hip adduction " "  Hip internal rotation    Hip external rotation    Knee flexion " "  Knee extension " "  Ankle dorsiflexion " "  Ankle plantarflexion " "  Ankle inversion    Ankle eversion     (Blank rows = not tested)  LOWER EXTREMITY MMT:   LLE remains weaker than right as pt endorses residual deficit from prior CVA. MMT Right Eval Left Eval  Hip flexion 4-/5 3+/5; jerky movement  Hip extension    Hip abduction 4/5 3+/5  Hip adduction 4/5 4-/5  Hip internal rotation    Hip external rotation    Knee flexion 4-/5 3+/5  Knee extension 4/5 4/5  Ankle dorsiflexion 4+/5 4/5  Ankle plantarflexion    Ankle inversion    Ankle eversion    (Blank rows = not tested)  BED MOBILITY:  Sit to supine Complete Independence Supine to sit Complete Independence  TRANSFERS: Assistive device utilized: None  Sit to stand: Complete Independence Stand to sit: Complete Independence Chair to chair: Complete Independence  GAIT: Gait pattern: step to pattern, decreased arm swing- Right, decreased arm swing- Left, decreased step length- Right, decreased stance time- Left, decreased stride length, decreased hip/knee flexion- Left, genu recurvatum- Right, and lateral lean- Left Distance walked: various clinic distances Assistive device utilized: None-pt does attempt to furniture walk Level of assistance: SBA and CGA Comments: Pt may most benefit from use of AD until pain is better managed.  FUNCTIONAL TESTs:  5 times sit to stand:  27.44 sec w/ BUE support 10 meter walk test: 17.41 sec no AD =  0.57 m/sec OR 1.90 ft/sec  PATIENT SURVEYS:  None completed.  TODAY'S TREATMENT:  N/A  PATIENT EDUCATION: Education details: PT POC, assessments performed and to be performed, and goals set. Person educated: Patient Education method: Explanation Education comprehension: verbalized understanding   HOME EXERCISE PROGRAM: To be established.    GOALS: Goals reviewed with patient? Yes  SHORT TERM GOALS: Target date: 02/23/2022  Pt will be independent with initial strength and balance HEP. Baseline:  To be established. Goal status: INITIAL  2.  Gait training to be initiated with LRAD to assess for improved safety with upright mobility. Baseline: Pt ambulates w/o AD at baseline. Goal status: INITIAL  3.  Pt will decrease 5xSTS to </=20 seconds using BUE support in order to demonstrate decreased risk for falls and improved functional bilateral LE strength and power. Baseline: 27.44 sec BUE support Goal status: INITIAL  4.  Pt will demonstrate a gait speed of >/=2.2 feet/sec in order to decrease risk for falls. Baseline: 1.90 ft/sec Goal status: INITIAL  LONG TERM GOALS: Target date: 03/23/2022  Pt will decrease 5xSTS to </=15 seconds w/ BUE support in order to demonstrate decreased risk for falls and improved functional bilateral LE strength and power. Baseline: 27.44 sec Goal status: INITIAL  2.  Pt will demonstrate a gait speed of >/=2.5 feet/sec in order to decrease risk for falls. Baseline: 1.90 ft/sec Goal status: INITIAL  3.  Pt will ambulate >/=250' using LRAD w/ improved LLE weight bearing in stance to improve safety and symmetry of upright ambulation for limited community distance. Baseline: antalgic gait w/ dec left stance. Goal status: INITIAL  4.  Pt will report pain of </=5/10 during ambulation and continuous activity to improve quality of life. Baseline: 8-9/10 during all  movement Goal  status: INITIAL  5.  FGA (or just stairs) to be assessed w/ LTG set. Baseline: Unable to assess on eval. Goal status: INITIAL  ASSESSMENT:  CLINICAL IMPRESSION: Patient is a 76 y.o. female who was seen today for physical therapy evaluation and treatment for gait abnormality and muscle ache.  Pt has a significant PMH of right cervical radiculopathy w/ ACDF C5-6 C6-7, chronic compression deformity of T3 w/o canal stenosis, bilateral THA, lumbar DDD, osteopenia, second degree heart block w/ pacemaker (HR does not drop below 60bpm), hypothyroidism, orthostatic hypotension, CVA, HLD, depression, and right mastectomy.  Identified impairments include antalgic gait w/ dec left stance time among other abnormalities, generalized imbalance, left LE muscle weakness worse than right, residual speech and memory deficits from prior CVA, mildly impaired coordination of LE, and widespread muscle pain.  Evaluation via the following assessment tools: 5xSTS and 10MWT indicate fall risk.  She would benefit from high level balance assessment such as the FGA at next visit to further assess deficits and fall risk.  She would benefit from skilled PT to address impairments as noted and progress towards long term goals.  OBJECTIVE IMPAIRMENTS Abnormal gait, cardiopulmonary status limiting activity, decreased activity tolerance, decreased balance, decreased cognition, decreased coordination, decreased endurance, decreased knowledge of condition, decreased knowledge of use of DME, decreased mobility, difficulty walking, decreased strength, decreased safety awareness, increased edema, postural dysfunction, and pain.   ACTIVITY LIMITATIONS carrying, lifting, bending, squatting, stairs, and locomotion level  PARTICIPATION LIMITATIONS: community activity  PERSONAL FACTORS Age, Fitness, Past/current experiences, Time since onset of injury/illness/exacerbation, and 3+ comorbidities: osteopenia, second degree heart block w/ pacemaker,  degenerative spine changes, orthostatic hypotension  are also affecting patient's functional outcome.   REHAB POTENTIAL: Good  CLINICAL DECISION MAKING: Evolving/moderate complexity  EVALUATION COMPLEXITY: Moderate  PLAN: PT FREQUENCY: 2x/week  PT DURATION: 8 weeks  PLANNED INTERVENTIONS: Therapeutic exercises, Therapeutic activity, Neuromuscular re-education, Balance training, Gait training, Patient/Family education, Self Care, Joint mobilization, Stair training, Vestibular training, DME instructions, Cryotherapy, Moist heat, Manual therapy, and Re-evaluation  PLAN FOR NEXT SESSION: Assess FGA-set goals, if pt cannot tolerate FGA or unsafe to perform please assess stair management and set goal, gait training w/ AD vs w/o, establish HEP for general stretching/strengthening, soft tissue mobilization for pain management.   Bary Richard, PT, DPT 01/23/2022, 1:12 PM

## 2022-01-23 NOTE — Telephone Encounter (Signed)
Received request for 90 day supply of duloxetine from [facility]. RX sent.

## 2022-02-03 DIAGNOSIS — Z23 Encounter for immunization: Secondary | ICD-10-CM | POA: Diagnosis not present

## 2022-02-07 ENCOUNTER — Other Ambulatory Visit: Payer: Self-pay | Admitting: Neurology

## 2022-02-12 ENCOUNTER — Ambulatory Visit: Payer: Medicare Other | Admitting: Physical Therapy

## 2022-02-12 DIAGNOSIS — E039 Hypothyroidism, unspecified: Secondary | ICD-10-CM | POA: Diagnosis not present

## 2022-02-12 DIAGNOSIS — R7989 Other specified abnormal findings of blood chemistry: Secondary | ICD-10-CM | POA: Diagnosis not present

## 2022-02-12 DIAGNOSIS — M5412 Radiculopathy, cervical region: Secondary | ICD-10-CM | POA: Diagnosis not present

## 2022-02-12 DIAGNOSIS — E785 Hyperlipidemia, unspecified: Secondary | ICD-10-CM | POA: Diagnosis not present

## 2022-02-12 DIAGNOSIS — M797 Fibromyalgia: Secondary | ICD-10-CM | POA: Diagnosis not present

## 2022-02-12 DIAGNOSIS — Z23 Encounter for immunization: Secondary | ICD-10-CM | POA: Diagnosis not present

## 2022-02-12 DIAGNOSIS — H00016 Hordeolum externum left eye, unspecified eyelid: Secondary | ICD-10-CM | POA: Diagnosis not present

## 2022-02-15 ENCOUNTER — Ambulatory Visit: Payer: Medicare Other | Admitting: Physical Therapy

## 2022-02-19 ENCOUNTER — Ambulatory Visit: Payer: Medicare Other | Admitting: Physical Therapy

## 2022-02-21 ENCOUNTER — Ambulatory Visit: Payer: Medicare Other | Admitting: Physical Therapy

## 2022-02-26 DIAGNOSIS — K52831 Collagenous colitis: Secondary | ICD-10-CM | POA: Diagnosis not present

## 2022-02-27 ENCOUNTER — Ambulatory Visit: Payer: Medicare Other | Admitting: Physical Therapy

## 2022-03-01 ENCOUNTER — Ambulatory Visit: Payer: Medicare Other | Admitting: Physical Therapy

## 2022-03-05 ENCOUNTER — Ambulatory Visit: Payer: Medicare Other

## 2022-03-08 ENCOUNTER — Ambulatory Visit: Payer: Medicare Other

## 2022-03-12 ENCOUNTER — Ambulatory Visit: Payer: Medicare Other

## 2022-03-14 ENCOUNTER — Ambulatory Visit (HOSPITAL_COMMUNITY): Payer: Medicare Other

## 2022-03-14 ENCOUNTER — Encounter (HOSPITAL_COMMUNITY): Payer: Self-pay

## 2022-03-15 ENCOUNTER — Ambulatory Visit: Payer: Medicare Other | Admitting: Physical Therapy

## 2022-03-19 ENCOUNTER — Ambulatory Visit: Payer: Medicare Other

## 2022-03-22 ENCOUNTER — Ambulatory Visit: Payer: Medicare Other

## 2022-03-26 ENCOUNTER — Ambulatory Visit (INDEPENDENT_AMBULATORY_CARE_PROVIDER_SITE_OTHER): Payer: Medicare Other

## 2022-03-26 DIAGNOSIS — I442 Atrioventricular block, complete: Secondary | ICD-10-CM

## 2022-03-27 ENCOUNTER — Ambulatory Visit: Payer: Medicare Other | Admitting: Physical Therapy

## 2022-03-27 LAB — CUP PACEART REMOTE DEVICE CHECK
Battery Remaining Longevity: 9 mo
Battery Voltage: 2.88 V
Brady Statistic AP VP Percent: 12.54 %
Brady Statistic AP VS Percent: 0 %
Brady Statistic AS VP Percent: 87.44 %
Brady Statistic AS VS Percent: 0.02 %
Brady Statistic RA Percent Paced: 12.54 %
Brady Statistic RV Percent Paced: 99.98 %
Date Time Interrogation Session: 20231113130234
Implantable Lead Connection Status: 753985
Implantable Lead Connection Status: 753985
Implantable Lead Implant Date: 20140818
Implantable Lead Implant Date: 20140818
Implantable Lead Location: 753859
Implantable Lead Location: 753860
Implantable Lead Model: 5076
Implantable Lead Model: 5076
Implantable Pulse Generator Implant Date: 20140818
Lead Channel Impedance Value: 304 Ohm
Lead Channel Impedance Value: 437 Ohm
Lead Channel Impedance Value: 494 Ohm
Lead Channel Impedance Value: 532 Ohm
Lead Channel Pacing Threshold Amplitude: 0.75 V
Lead Channel Pacing Threshold Amplitude: 1.625 V
Lead Channel Pacing Threshold Pulse Width: 0.4 ms
Lead Channel Pacing Threshold Pulse Width: 0.4 ms
Lead Channel Sensing Intrinsic Amplitude: 1.125 mV
Lead Channel Sensing Intrinsic Amplitude: 1.125 mV
Lead Channel Sensing Intrinsic Amplitude: 21.25 mV
Lead Channel Sensing Intrinsic Amplitude: 21.25 mV
Lead Channel Setting Pacing Amplitude: 2.5 V
Lead Channel Setting Pacing Amplitude: 3.25 V
Lead Channel Setting Pacing Pulse Width: 0.4 ms
Lead Channel Setting Sensing Sensitivity: 0.9 mV
Zone Setting Status: 755011

## 2022-03-29 ENCOUNTER — Ambulatory Visit: Payer: Medicare Other | Admitting: Physical Therapy

## 2022-04-02 ENCOUNTER — Ambulatory Visit: Payer: Medicare Other

## 2022-04-10 ENCOUNTER — Ambulatory Visit: Payer: Medicare Other | Admitting: Physical Therapy

## 2022-04-12 ENCOUNTER — Ambulatory Visit: Payer: Medicare Other | Admitting: Physical Therapy

## 2022-05-03 ENCOUNTER — Encounter: Payer: Self-pay | Admitting: Internal Medicine

## 2022-05-10 NOTE — Progress Notes (Signed)
Remote pacemaker transmission.   

## 2022-05-14 ENCOUNTER — Other Ambulatory Visit: Payer: Self-pay | Admitting: Internal Medicine

## 2022-05-28 ENCOUNTER — Encounter: Payer: Medicare Other | Admitting: Internal Medicine

## 2022-06-20 ENCOUNTER — Encounter: Payer: Self-pay | Admitting: Internal Medicine

## 2022-06-20 ENCOUNTER — Ambulatory Visit: Payer: Medicare Other | Attending: Internal Medicine | Admitting: Internal Medicine

## 2022-06-20 VITALS — BP 111/76 | HR 94 | Ht 67.0 in | Wt 179.6 lb

## 2022-06-20 DIAGNOSIS — Z950 Presence of cardiac pacemaker: Secondary | ICD-10-CM

## 2022-06-20 DIAGNOSIS — I951 Orthostatic hypotension: Secondary | ICD-10-CM | POA: Diagnosis not present

## 2022-06-20 DIAGNOSIS — M797 Fibromyalgia: Secondary | ICD-10-CM | POA: Diagnosis not present

## 2022-06-20 DIAGNOSIS — Z636 Dependent relative needing care at home: Secondary | ICD-10-CM | POA: Diagnosis not present

## 2022-06-20 DIAGNOSIS — I480 Paroxysmal atrial fibrillation: Secondary | ICD-10-CM | POA: Diagnosis not present

## 2022-06-20 DIAGNOSIS — R4189 Other symptoms and signs involving cognitive functions and awareness: Secondary | ICD-10-CM | POA: Diagnosis not present

## 2022-06-20 DIAGNOSIS — R269 Unspecified abnormalities of gait and mobility: Secondary | ICD-10-CM | POA: Diagnosis not present

## 2022-06-20 NOTE — Progress Notes (Signed)
A and a and     Patient Care Team: Burnard Bunting, MD as PCP - General (Internal Medicine) Deboraha Sprang, MD as PCP - Electrophysiology (Cardiology) Chucky May, MD as Consulting Physician (Psychiatry)   HPI  [name] is a 77 y.o. female Seen in followup for high grade heart block for which she underwent pacing (2014 Medtronic )compllicated by microperforation modest effusion and then adderall withdrawal; now with complete heart block Previous complaints of dyspnea were resolved with reprogramming And c/o Chest pain >> CTA neg (See Below)   Seen intercurrently 7/21 GT for small pericardial effusion..  She reminded that she has a diagnosis of central polyneuropathy;    She has been followed at [facility] with Marilynne Drivers. She was last seen 6/19 Salt and proamatine were again recommended with up titration of her dose to 4 times a day.    She continues to struggle with dyspnea.  Lightheadedness upon standing.  She has also seen multiple physicians with a diagnosis recently of fibromyalgia and concerns as to whether she has MS.  She is also under a great deal of stress concerning her daughter lives in [place] and she is on her way there shortly.    DATE TEST EF   9/14 Echo   55-60 %   12/19 Echo   55-60 %   2/21 CTA  CaScore 2 Min CAD  7/21 Echo  50-55% Pericardial effusion Trivial    Date Cr K Hgb  6/20 0.8 4.0 13.8                  Past Medical History:  Diagnosis Date   ADHD (attention deficit hyperactivity disorder)    Allergy    trees/pollen, mold, fungus, dust mites. Takes allergy shots   Arthritis    PAIN AND OA LEFT HIP   Asthma    allergist Dr Olena Heckle- monthly allergy injections   Autonomic dysfunction    CENTRAL NERVOUS SYSTEM NEUROPATHY - DX BY DR. Erling Cruz MORE THAN 10 YRS AGO - AND IT IS FELT TO CONTRIBUTE TO THE AUTOMIC  DYSFUNCTION-- PT HAS NUMBNESS LEGS AND FEET AND SOMETIIMES TIPS OF FINGER, SEVERE CONSTIPATION( NO SENSATION TO HAVE BM  ), DOUBLE VISION, ORTHOSTATIC HYPOTENSION   Blood transfusion    Breast cancer ([place])    right side   Bruises easily    Cataract    Chest pain    a. 12/2012 Cath: nl cors, EF 55-65%.   Complication of anesthesia    reaction to some anesthetics/ 7/12 anesth record on chart- states prefers epidural   CVA (cerebrovascular accident) ([place]) 03/21/2018   Depression    Diverticulosis    Dysrhythmia    HX OF HIGH GRADE HEART BLOCK - REQUIRED PACEMAKER INSERTION   Esophageal stricture    Gastritis    GERD (gastroesophageal reflux disease)    H/O hiatal hernia    Hemorrhoids    Hernia of abdominal wall    spigelian hernia RLQ - SURGERY TO REPAIR   Hypothyroidism    Interstitial cystitis    Latex allergy, contact dermatitis    Mallory - Weiss tear    HEALED    Neuromuscular disorder (HCC)    central nervous system neuropathy- seen per Dr Erling Cruz   Pacemaker    Pericardial effusion    a. 12/2012 following ppm placement;  b. 01/01/2013 Echo: EF 55-60%, small pericardial effusion w/o RV collapse-->No need for tap/window.   PONV (postoperative nausea and vomiting)    pt needs  scop patch   Recurrent upper respiratory infection (URI) 1/13- to present   bronchitis following surgery- states improved but still with cough. OV with [facility] Dr [name] 09/06/11 on chart   Shortness of breath    AT TIMES - BUT [place].   Symptomatic bradycardia    a. 12/2012 s/p MDT dual chamber PPM, ser # [id number]; b. 12/2012 post-op course complicated by pericardial effusinon req lead revision.   Thyroid disease     Past Surgical History:  Procedure Laterality Date   ABDOMINAL HYSTERECTOMY  1974   BACK SURGERY     cervical fusion 4-5 with plate   BLADDER SUSPENSION     BREAST BIOPSY  2002   NO BLOOD PRESSURES ON RIGHT SIDE/   s/p  axillary node dissection   CYSTO WITH HYDRODISTENSION  09/07/2011   Procedure: CYSTOSCOPY/HYDRODISTENSION;  Surgeon: Ailene Rud, MD;   Location: [facility];  Service: Urology;  Laterality: N/A;  INSTILLATION OF MARCAINE/PYRIDIUM INSTILLATION OF MARCAINE/KENALOG   CYSTOSCOPY  1975, 2007   EYE SURGERY     LASIK EYE SURGERY BILATERAL   LAPAROSCOPY  1973   LEAD REVISION N/A 12/30/2012   Procedure: LEAD REVISION;  Surgeon: Evans Lance, MD;  Location: [facility] CATH LAB;  Service: Cardiovascular;  Laterality: N/A;   LEFT HEART CATHETERIZATION WITH CORONARY ANGIOGRAM N/A 12/29/2012   Procedure: LEFT HEART CATHETERIZATION WITH CORONARY ANGIOGRAM;  Surgeon: Peter M Martinique, MD;  Location: [facility] CATH LAB;  Service: Cardiovascular;  Laterality: N/A;   MASTECTOMY MODIFIED RADICAL     right; with immediate reconstruction   MYRINGOPLASTY  1962   NECK SURGERY     c4-5 ruptured disc   OOPHORECTOMY  1982   PACEMAKER INSERTION     PERMANENT PACEMAKER INSERTION N/A 12/29/2012   Procedure: PERMANENT PACEMAKER INSERTION;  Surgeon: Deboraha Sprang, MD;  Location: [facility] CATH LAB;  Service: Cardiovascular;  Laterality: N/A;   RECTOCELE REPAIR     with cystocele repair   TONSILLECTOMY     TOTAL HIP ARTHROPLASTY Right    TOTAL HIP ARTHROPLASTY Left 06/12/2013   Procedure: LEFT TOTAL HIP ARTHROPLASTY ANTERIOR APPROACH;  Surgeon: Mcarthur Rossetti, MD;  Location: [facility];  Service: Orthopedics;  Laterality: Left;   TYMPANOPLASTY  1973   right   ULNAR NERVE TRANSPOSITION Right    VENTRAL HERNIA REPAIR  05/30/2011   Procedure: HERNIA REPAIR VENTRAL ADULT;  Surgeon: Haywood Lasso, MD;  Location: [place];  Service: General;  Laterality: Right;  repair right spigelian hernia   Current Meds  Medication Sig   acetaminophen (TYLENOL) 500 MG tablet Take 1,000 mg by mouth 2 (two) times daily.   albuterol (PROVENTIL HFA;VENTOLIN HFA) 108 (90 Base) MCG/ACT inhaler Inhale 2 puffs into the lungs every 6 (six) hours as needed. For shortness of breath (Patient taking differently: Inhale 2 puffs into the lungs every 6 (six) hours as needed for wheezing  or shortness of breath.)   aspirin EC 81 MG EC tablet Take 1 tablet (81 mg total) by mouth daily.   budesonide (ENTOCORT EC) 3 MG 24 hr capsule Take 3 mg by mouth daily.   calcium carbonate (OSCAL) 1500 (600 Ca) MG TABS tablet Take 600 mg of elemental calcium by mouth daily.   cetirizine (ZYRTEC) 10 MG tablet Take 10 mg by mouth 2 (two) times daily.   cholecalciferol (VITAMIN D) 1000 UNITS tablet Take 1,000 Units by mouth daily.   cycloSPORINE (RESTASIS) 0.05 % ophthalmic emulsion  Place 1 drop into both eyes 2 (two) times daily.   diclofenac Sodium (VOLTAREN) 1 % GEL Apply 1 application  topically daily as needed (pain).   DULoxetine (CYMBALTA) 30 MG capsule Take 1 capsule (30 mg total) by mouth daily.   esomeprazole (NEXIUM) 20 MG capsule Take 20 mg by mouth 2 (two) times daily.   estradiol (ESTRING) 2 MG vaginal ring Place 2 mg vaginally every 3 (three) months. follow package directions   fludrocortisone (FLORINEF) 0.1 MG tablet Take 1 tablet (0.1 mg total) by mouth 2 (two) times daily.   gabapentin (NEURONTIN) 300 MG capsule Take 2 capsules (600 mg total) by mouth 3 (three) times daily.   levothyroxine (SYNTHROID) 50 MCG tablet Take 50 mcg by mouth daily before breakfast.   Magnesium 250 MG TABS Take 250 mg by mouth daily.   meclizine (ANTIVERT) 25 MG tablet Take 25 mg by mouth 3 (three) times daily as needed for dizziness.    melatonin 3 MG TABS tablet Take 1 tablet by mouth at bedtime.   ondansetron (ZOFRAN) 4 MG tablet Take 4 mg by mouth every 8 (eight) hours as needed for nausea.    potassium chloride (KLOR-CON) 10 MEQ tablet TAKE 1 TABLET DAILY   Probiotic Product (ALIGN) 4 MG CAPS Take 4 mg by mouth at bedtime.    rosuvastatin (CRESTOR) 10 MG tablet Take 1 tablet (10 mg total) by mouth daily. (Patient taking differently: Take 10 mg by mouth at bedtime.)   vitamin B-12 (CYANOCOBALAMIN) 500 MCG tablet Take 500 mcg by mouth daily.     Allergies  Allergen Reactions   Anesthetics,  Amide Other (See Comments)    Patient unsure of names, however multiples cause swelling of airway & nausea   Clindamycin Swelling   Fentanyl Anaphylaxis   Shellfish Allergy Other (See Comments)    Neurotoxic reaction   Sulfonamide Derivatives Anaphylaxis and Swelling   Neomycin Swelling   Zolpidem Tartrate Other (See Comments)    Jerking motions    Azithromycin Other (See Comments)    Severe gastritis    Bactroban Other (See Comments)    Causes sores in nose   Ciprofloxacin     joint swelling   Codeine Swelling   Meperidine Hcl Nausea Only    Hallucinations    Morphine Nausea Only    Hallucinations    Neosporin [Neomycin-Polymyxin-Gramicidin] Hives and Dermatitis    All topical "orin's ointment"   Nitrofurantoin     neuropathy in legs   Other     ALLERGIC TO WARM WATER SHELLFISH     Penicillins Other (See Comments)    Swelling in joints   Percocet [Oxycodone-Acetaminophen]     Dizziness    Tramadol     Makes jerk   Latex Rash    Review of Systems negative except from HPI and PMH  Physical Exam BP 111/76 (Patient Position: Standing)   Pulse 94   Ht '5\' 7"'$  (1.702 m)   Wt 179 lb 9.6 oz (81.5 kg)   SpO2 94%   BMI 28.13 kg/m  Well developed and well nourished in no acute distress HENT normal Neck supple with JVP-flat Clear Device pocket well healed; without hematoma or erythema.  There is no tethering  Regular rate and rhythm, no   gallop 2/6 murmur Abd-soft with active BS No Clubbing cyanosis   edema Skin-warm and dry A & Oriented  Grossly normal sensory and motor function  ECG sinus with P synchronous pacing at 77  Device function is  normal. Programming changes   See Paceart for details     Assessment and  Plan  Complete heart block  Pacemaker-Medtronic   Exercise associated hypotension  ? Dysautonomia  Central polyneuropathy  Dyspnea on exertion  Hiatal hernia  Device function is normal approaching ERI.  Safe to go to [place].  Will see  her in the device clinic in 3 months.  Would get an echo at that time to look for pacemaker cardiomyopathy  Orthostatic vital signs are more abnormal than previously.  As you are leaving town, would not introduce any medication suggested spanks perhaps gurgle pants, as well as reviewed isometric contraction to undertake prior to standing.  She is doing recumbent exercise.

## 2022-06-20 NOTE — Patient Instructions (Signed)
Medication Instructions:  Your physician recommends that you continue on your current medications as directed. Please refer to the Current Medication list given to you today.  *If you need a refill on your cardiac medications before your next appointment, please call your pharmacy*   Lab Work: None ordered.  If you have labs (blood work) drawn today and your tests are completely normal, you will receive your results only by: [place] (if you have MyChart) OR A paper copy in the mail If you have any lab test that is abnormal or we need to change your treatment, we will call you to review the results.   Testing/Procedures: None ordered.    Follow-Up: At [facility], you and your health needs are our priority.  As part of our continuing mission to provide you with exceptional heart care, we have created designated Provider Care Teams.  These Care Teams include your primary Cardiologist (physician) and Advanced Practice Providers (APPs -  Physician Assistants and Nurse Practitioners) who all work together to provide you with the care you need, when you need it.  We recommend signing up for the patient portal called "MyChart".  Sign up information is provided on this After Visit Summary.  MyChart is used to connect with patients for Virtual Visits (Telemedicine).  Patients are able to view lab/test results, encounter notes, upcoming appointments, etc.  Non-urgent messages can be sent to your provider as well.   To learn more about what you can do with MyChart, go to [email or web address].    Your next appointment:   As scheduled with device clinic

## 2022-06-25 ENCOUNTER — Ambulatory Visit: Payer: Medicare Other

## 2022-06-25 DIAGNOSIS — I442 Atrioventricular block, complete: Secondary | ICD-10-CM

## 2022-06-26 LAB — CUP PACEART REMOTE DEVICE CHECK
Battery Remaining Longevity: 7 mo
Battery Voltage: 2.86 V
Brady Statistic AP VP Percent: 3.5 %
Brady Statistic AP VS Percent: 0 %
Brady Statistic AS VP Percent: 96.48 %
Brady Statistic AS VS Percent: 0.02 %
Brady Statistic RA Percent Paced: 3.5 %
Brady Statistic RV Percent Paced: 99.98 %
Date Time Interrogation Session: 20240212152509
Implantable Lead Connection Status: 753985
Implantable Lead Connection Status: 753985
Implantable Lead Implant Date: 20140818
Implantable Lead Implant Date: 20140818
Implantable Lead Location: 753859
Implantable Lead Location: 753860
Implantable Lead Model: 5076
Implantable Lead Model: 5076
Implantable Pulse Generator Implant Date: 20140818
Lead Channel Impedance Value: 342 Ohm
Lead Channel Impedance Value: 456 Ohm
Lead Channel Impedance Value: 646 Ohm
Lead Channel Impedance Value: 665 Ohm
Lead Channel Pacing Threshold Amplitude: 0.75 V
Lead Channel Pacing Threshold Amplitude: 1.25 V
Lead Channel Pacing Threshold Pulse Width: 0.4 ms
Lead Channel Pacing Threshold Pulse Width: 0.4 ms
Lead Channel Sensing Intrinsic Amplitude: 15.625 mV
Lead Channel Sensing Intrinsic Amplitude: 2.375 mV
Lead Channel Sensing Intrinsic Amplitude: 2.375 mV
Lead Channel Sensing Intrinsic Amplitude: 20.25 mV
Lead Channel Setting Pacing Amplitude: 2.5 V
Lead Channel Setting Pacing Amplitude: 2.5 V
Lead Channel Setting Pacing Pulse Width: 0.4 ms
Lead Channel Setting Sensing Sensitivity: 0.9 mV
Zone Setting Status: 755011

## 2022-08-09 NOTE — Progress Notes (Signed)
Remote pacemaker transmission.   

## 2022-08-13 ENCOUNTER — Other Ambulatory Visit: Payer: Self-pay | Admitting: Internal Medicine

## 2022-08-20 ENCOUNTER — Telehealth: Payer: Self-pay | Admitting: Neurology

## 2022-08-20 NOTE — Telephone Encounter (Signed)
Visit has been changed per chart

## 2022-08-20 NOTE — Telephone Encounter (Signed)
Ok to change to virutal visit at previous slot

## 2022-08-20 NOTE — Telephone Encounter (Signed)
Called pt to confirmed appt, pt states she has been in [place] for few months longer than anticipated because of her sick daughter. Pt is requesting a phone or virtual visit because she states she can not get home anytime soon.

## 2022-08-23 ENCOUNTER — Telehealth (INDEPENDENT_AMBULATORY_CARE_PROVIDER_SITE_OTHER): Payer: Medicare Other | Admitting: Neurology

## 2022-08-23 DIAGNOSIS — R269 Unspecified abnormalities of gait and mobility: Secondary | ICD-10-CM

## 2022-08-23 DIAGNOSIS — M791 Myalgia, unspecified site: Secondary | ICD-10-CM | POA: Diagnosis not present

## 2022-08-23 MED ORDER — MELOXICAM 7.5 MG PO TABS: 7.5000 mg | ORAL_TABLET | Freq: Every day | ORAL | 5 refills | Status: DC | PRN

## 2022-08-23 NOTE — Progress Notes (Signed)
ASSESSMENT AND PLAN  [name] is a 77 y.o. female   Worsening gait abnormality Bilateral lower extremity deep achy pain  EMG nerve conduction study in August 2023 showed no evidence of active lumbar radiculopathy to explain her pain and subjective weakness gait abnormality,  She has significant tenderness upon deep palpitation at proximal upper and lower extremity muscles,  CT myelogram of lumbar spine showed multilevel degenerative changes without definite neural foraminal narrowing,  CT of left hip showed left total hip arthroplasty without hardware failure or complication  CT cervical spine to show previous ACDF C5-6, C 6 7, no evidence of cord or foraminal stenosis  Laboratory evaluation showed NO  significant abnormality, negative inflammatory markers, including C-reactive protein, ESR,   Her current gait abnormality most likely related to her general deconditioning, aging, anxiety, poor sleeping quality,  diffuse muscle achy pain, there was no evidence of structural abnormality or muscle weakness, encouraged her to gradually increase mobility  She is tolerating Cymbalta, now higher dose of 60 mg daily, successfully taper off Lexapro, did not notice any significant change,  Tylenol alternating with Mobic as needed for body achy pain, stretching, warm compression, continue moderate exercise    DIAGNOSTIC DATA (LABS, IMAGING, TESTING) - I reviewed patient records, labs, notes, testing and imaging myself where available.   MEDICAL HISTORY:  [name] is a 77 year old female, seen in request by neurosurgeon Dr. Barnett Abu for evaluation of low back pain, leg weakness, her primary care physician is Dr. Geoffry Paradise, initial evaluation was on November 10, 2021  I reviewed and summarized the referring note.PMHX. S/p pacemaker. Histroy of right cervical radiculopathy, surgery did help.  Hx of bilateral hip replacement  In October 2022, after lifting a heavy  box of books, she noticed low back pain, muscle spasm and lower back pain  She was initially treated with NSAIDs, muscle relaxant with some help  But since then, with prolonged standing, bending over she will develop low back pain, then was given steroid, physical therapy,  She had a history of bilateral hip replacement,  Over the past few months, she developed worsening low back pain, radiating pain to left lateral thigh region, she received injection for left hip bursitis, did not help her symptoms  CT of left hip in February 2023 showed total arthroplasty without hardware failure or complication  CT lumbar myelogram October 25, 2021 showed lumbar region disc bulging, facet osteoarthritis with mild narrowing of lateral recess at L3-4, L4-5 without definite neural compression,  CT thoracic spine showed chronic compression deformity of T3 no significant canal stenosis  She now complains of significant left lower extremity weakness, when she lying down sitting down no significant pain, when she getting up she complains of significant left leg pain 9 out of 10, radiating to the left lateral leg, deep aching pain at nighttime, has to take frequent over-the-counter medications  UPDATE August 16th 2023: Patient is accompanied by her husband return for electrodiagnostic study today, which showed evidence of mild peripheral neuropathy if anything, the test is also limited due to her lower extremity pitting edema, relative preserved distal lower extremity motor nerve conduction study  EMG nerve conduction study showed no evidence of active lumbar radiculopathy  On further questioning, today she mainly complains of subacute onset of bilateral lower extremity deep achy pain, mainly involving left lateral thigh,  now similar involvement of the right side, also complains of low back pain, upper extremity pain  She was treated  with tapering dose of the steroid for 1 week by orthopedic surgeon for left hip  pain, she did reported significant improvement afterwards, benefit last for more than 4 weeks, but she could not tolerate the side effect of high-dose of prednisone after 60 mg daily,  She later had few sessions of epidural injection without any benefit  Today she also complains of worsening urinary urgency, to the point of incontinence,  She did have a history of cervical decompression surgery in the past, for severe right cervical radiculopathy, denied gait abnormality prior to her cervical decompression surgery  UPDATE Sept 5th 2023: She is accompanied by her husband at today's clinical visit, continues to complains of moderate left lateral thigh pain, right thigh pain, gait abnormality mainly due to limitation from pain  Reviewed laboratory evaluations normal high-sensitivity C-reactive protein ESR, CPK, B12, ANA, slightly low vitamin D 26.2  Reviewed home medication list, she has been on Lexapro 10 mg since age 89, she does suffer depression following total hysterectomy, she denies depression,  Personally reviewed CT cervical spine August 2023, status post ACDF C5-6 C6-7, no significant canal foraminal narrowing  CT lumbar myelogram October 25, 2021, multilevel degenerative changes, most obvious degenerative changes worse at L4-5, facet and ligamentous hypertrophy, mild stenosis of lateral recess but without definite neural compression  Husband revealed that she has become very sedentary since 2022, likely deconditioned  Virtual Visit via video August 23 2022: I discussed the limitations of evaluation and management by telemedicine and the availability of in person appointments. The patient expressed understanding and agreed to proceed  Location: Provider: [facility] office; Patient: Home  I connected with Donnamae JudeMargaret E Mees  on August 23 2022 by a video enabled telemedicine application and verified that I am speaking with the correct person using two identifiers.  UPDATED She was setup for  physical therapy following last visit, but shortly after that she was called in to take care of her daughter at [place], who is facing a lots of surgery, hiatal hernia, SI joint fusion, now needing shoulder replacement.  She is very struggling with her condition mentally and physically.  Continue complains of gait abnormality, intermittent body achy pain involving her lower extremity, knees, low back, frequent muscle spasm, sometimes leg gave out underneath her, like a rubber, risk for fall,    PHYSICAL EXAM:   NEUROLOGICAL EXAM:  MENTAL STATUS: Speech/cognition: Awake, alert, oriented to history taking and casual conversation CRANIAL NERVES: CN II: Visual fields are full to confrontation. Pupils are round equal and briskly reactive to light. CN III, IV, VI: extraocular movement are normal. No ptosis. CN V: Facial sensation is intact to light touch CN VII: Face is symmetric with normal eye closure  CN VIII: Hearing is normal to causal conversation. CN IX, X: Phonation is normal. CN XI: Head turning and shoulder shrug are intact  MOTOR: Moving 4 extremities without difficulty  SENSORY: Decreased vibratory sensation to below knee level, decreased to pinprick to mid shin level,  COORDINATION: There is no trunk or limb dysmetria noted.  GAIT/STANCE: She needs push-up to get up from seated position, cautious, able to walk backwards, forward, mildly unsteady  REVIEW OF SYSTEMS:  Full 14 system review of systems performed and notable only for as above All other review of systems were negative.   ALLERGIES: Allergies  Allergen Reactions   Anesthetics, Amide Other (See Comments)    Patient unsure of names, however multiples cause swelling of airway & nausea   Clindamycin Swelling  Fentanyl Anaphylaxis   Shellfish Allergy Other (See Comments)    Neurotoxic reaction   Sulfonamide Derivatives Anaphylaxis and Swelling   Neomycin Swelling   Zolpidem Tartrate Other (See Comments)     Jerking motions    Azithromycin Other (See Comments)    Severe gastritis    Bactroban Other (See Comments)    Causes sores in nose   Ciprofloxacin     joint swelling   Codeine Swelling   Meperidine Hcl Nausea Only    Hallucinations    Morphine Nausea Only    Hallucinations    Neosporin [Neomycin-Polymyxin-Gramicidin] Hives and Dermatitis    All topical "orin's ointment"   Nitrofurantoin     neuropathy in legs   Other     ALLERGIC TO WARM WATER SHELLFISH     Penicillins Other (See Comments)    Swelling in joints   Percocet [Oxycodone-Acetaminophen]     Dizziness    Tramadol     Makes jerk   Latex Rash    HOME MEDICATIONS: Current Outpatient Medications  Medication Sig Dispense Refill   acetaminophen (TYLENOL) 500 MG tablet Take 1,000 mg by mouth 2 (two) times daily.     albuterol (PROVENTIL HFA;VENTOLIN HFA) 108 (90 Base) MCG/ACT inhaler Inhale 2 puffs into the lungs every 6 (six) hours as needed. For shortness of breath (Patient taking differently: Inhale 2 puffs into the lungs every 6 (six) hours as needed for wheezing or shortness of breath.) 1 Inhaler 1   aspirin EC 81 MG EC tablet Take 1 tablet (81 mg total) by mouth daily.     budesonide (ENTOCORT EC) 3 MG 24 hr capsule Take 3 mg by mouth daily.     calcium carbonate (OSCAL) 1500 (600 Ca) MG TABS tablet Take 600 mg of elemental calcium by mouth daily.     cetirizine (ZYRTEC) 10 MG tablet Take 10 mg by mouth 2 (two) times daily.     cholecalciferol (VITAMIN D) 1000 UNITS tablet Take 1,000 Units by mouth daily.     cycloSPORINE (RESTASIS) 0.05 % ophthalmic emulsion Place 1 drop into both eyes 2 (two) times daily.     diclofenac Sodium (VOLTAREN) 1 % GEL Apply 1 application  topically daily as needed (pain).     DULoxetine (CYMBALTA) 30 MG capsule Take 1 capsule (30 mg total) by mouth daily. 90 capsule 1   escitalopram (LEXAPRO) 10 MG tablet Take 10 mg by mouth daily.     esomeprazole (NEXIUM) 20 MG capsule Take 20 mg by  mouth 2 (two) times daily.     estradiol (ESTRING) 2 MG vaginal ring Place 2 mg vaginally every 3 (three) months. follow package directions     fludrocortisone (FLORINEF) 0.1 MG tablet Take 1 tablet (0.1 mg total) by mouth 2 (two) times daily. 180 tablet 3   gabapentin (NEURONTIN) 300 MG capsule Take 2 capsules (600 mg total) by mouth 3 (three) times daily. 180 capsule 3   levothyroxine (SYNTHROID) 50 MCG tablet Take 50 mcg by mouth daily before breakfast.     Magnesium 250 MG TABS Take 250 mg by mouth daily.     meclizine (ANTIVERT) 25 MG tablet Take 25 mg by mouth 3 (three) times daily as needed for dizziness.      melatonin 3 MG TABS tablet Take 1 tablet by mouth at bedtime.     ondansetron (ZOFRAN) 4 MG tablet Take 4 mg by mouth every 8 (eight) hours as needed for nausea.      potassium chloride (  KLOR-CON) 10 MEQ tablet TAKE 1 TABLET DAILY 90 tablet 3   Probiotic Product (ALIGN) 4 MG CAPS Take 4 mg by mouth at bedtime.      rosuvastatin (CRESTOR) 10 MG tablet Take 1 tablet (10 mg total) by mouth daily. (Patient taking differently: Take 10 mg by mouth at bedtime.) 90 tablet 0   vitamin B-12 (CYANOCOBALAMIN) 500 MCG tablet Take 500 mcg by mouth daily.     No current facility-administered medications for this visit.   Facility-Administered Medications Ordered in Other Visits  Medication Dose Route Frequency Provider Last Rate Last Admin   bupivacaine (MARCAINE) 0.5 % 10 mL, triamcinolone acetonide (KENALOG-40) 40 mg injection   Subcutaneous Once Jethro Bolus, MD       bupivacaine (MARCAINE) 0.5 % 15 mL, phenazopyridine (PYRIDIUM) 400 mg bladder mixture   Bladder Instillation Once Jethro Bolus, MD        PAST MEDICAL HISTORY: Past Medical History:  Diagnosis Date   ADHD (attention deficit hyperactivity disorder)    Allergy    trees/pollen, mold, fungus, dust mites. Takes allergy shots   Arthritis    PAIN AND OA LEFT HIP   Asthma    allergist Dr Illene Labrador- monthly allergy  injections   Autonomic dysfunction    CENTRAL NERVOUS SYSTEM NEUROPATHY - DX BY DR. Sandria Manly MORE THAN 10 YRS AGO - AND IT IS FELT TO CONTRIBUTE TO THE AUTOMIC  DYSFUNCTION-- PT HAS NUMBNESS LEGS AND FEET AND SOMETIIMES TIPS OF FINGER, SEVERE CONSTIPATION( NO SENSATION TO HAVE BM ), DOUBLE VISION, ORTHOSTATIC HYPOTENSION   Blood transfusion    Breast cancer (HCC)    right side   Bruises easily    Cataract    Chest pain    a. 12/2012 Cath: nl cors, EF 55-65%.   Complication of anesthesia    reaction to some anesthetics/ 7/12 anesth record on chart- states prefers epidural   CVA (cerebrovascular accident) (HCC) 03/21/2018   Depression    Diverticulosis    Dysrhythmia    HX OF HIGH GRADE HEART BLOCK - REQUIRED PACEMAKER INSERTION   Esophageal stricture    Gastritis    GERD (gastroesophageal reflux disease)    H/O hiatal hernia    Hemorrhoids    Hernia of abdominal wall    spigelian hernia RLQ - SURGERY TO REPAIR   Hypothyroidism    Interstitial cystitis    Latex allergy, contact dermatitis    Mallory - Weiss tear    HEALED    Neuromuscular disorder (HCC)    central nervous system neuropathy- seen per Dr Sandria Manly   Pacemaker    Pericardial effusion    a. 12/2012 following ppm placement;  b. 01/01/2013 Echo: EF 55-60%, small pericardial effusion w/o RV collapse-->No need for tap/window.   PONV (postoperative nausea and vomiting)    pt needs scop patch   Recurrent upper respiratory infection (URI) 1/13- to present   bronchitis following surgery- states improved but still with cough. OV with [facility] Dr [name] 09/06/11 on chart   Shortness of breath    AT TIMES - BUT MUCH IMPROVED AFTER PACEMAKER WAS REPROGRAMED.   Symptomatic bradycardia    a. 12/2012 s/p MDT dual chamber PPM, ser # [id number]; b. 12/2012 post-op course complicated by pericardial effusinon req lead revision.   Thyroid disease     PAST SURGICAL HISTORY: Past Surgical History:  Procedure Laterality Date   ABDOMINAL  HYSTERECTOMY  1974   BACK SURGERY     cervical fusion 4-5 with plate  BLADDER SUSPENSION     BREAST BIOPSY  2002   NO BLOOD PRESSURES ON RIGHT SIDE/   s/p  axillary node dissection   CYSTO WITH HYDRODISTENSION  09/07/2011   Procedure: CYSTOSCOPY/HYDRODISTENSION;  Surgeon: Kathi Ludwig, MD;  Location: [facility];  Service: Urology;  Laterality: N/A;  INSTILLATION OF MARCAINE/PYRIDIUM INSTILLATION OF MARCAINE/KENALOG   CYSTOSCOPY  1975, 2007   EYE SURGERY     LASIK EYE SURGERY BILATERAL   LAPAROSCOPY  1973   LEAD REVISION N/A 12/30/2012   Procedure: LEAD REVISION;  Surgeon: Marinus Maw, MD;  Location: [facility] CATH LAB;  Service: Cardiovascular;  Laterality: N/A;   LEFT HEART CATHETERIZATION WITH CORONARY ANGIOGRAM N/A 12/29/2012   Procedure: LEFT HEART CATHETERIZATION WITH CORONARY ANGIOGRAM;  Surgeon: Peter M Swaziland, MD;  Location: [facility] CATH LAB;  Service: Cardiovascular;  Laterality: N/A;   MASTECTOMY MODIFIED RADICAL     right; with immediate reconstruction   MYRINGOPLASTY  1962   NECK SURGERY     c4-5 ruptured disc   OOPHORECTOMY  1982   PACEMAKER INSERTION     PERMANENT PACEMAKER INSERTION N/A 12/29/2012   Procedure: PERMANENT PACEMAKER INSERTION;  Surgeon: Duke Salvia, MD;  Location: [facility] CATH LAB;  Service: Cardiovascular;  Laterality: N/A;   RECTOCELE REPAIR     with cystocele repair   TONSILLECTOMY     TOTAL HIP ARTHROPLASTY Right    TOTAL HIP ARTHROPLASTY Left 06/12/2013   Procedure: LEFT TOTAL HIP ARTHROPLASTY ANTERIOR APPROACH;  Surgeon: Kathryne Hitch, MD;  Location: [facility];  Service: Orthopedics;  Laterality: Left;   TYMPANOPLASTY  1973   right   ULNAR NERVE TRANSPOSITION Right    VENTRAL HERNIA REPAIR  05/30/2011   Procedure: HERNIA REPAIR VENTRAL ADULT;  Surgeon: Currie Paris, MD;  Location: [facility];  Service: General;  Laterality: Right;  repair right spigelian hernia    FAMILY HISTORY: Family History  Problem Relation Age of Onset    Heart disease Father    Colon cancer Mother 52   Depression Mother    Pancreatic cancer Sister 4   Diverticulitis Sister    Uterine cancer Maternal Grandmother    Heart disease Brother    Heart disease Paternal Grandmother    Heart disease Paternal Grandfather    Throat cancer Maternal Uncle    Heart disease Paternal Aunt    Heart disease Paternal Uncle    Heart disease Brother    Thrombosis Maternal Aunt    Cancer Maternal Uncle        NOS   Diabetes Other        aunt    SOCIAL HISTORY: Social History   Socioeconomic History   Marital status: Married    Spouse name: Not on file   Number of children: 2   Years of education: 15   Highest education level: Not on file  Occupational History   Occupation: Retired    Associate Professor: [employer]    Comment: vp corporate affairs [employer]    Employer: [employer]  Tobacco Use   Smoking status: Never   Smokeless tobacco: Never  Vaping Use   Vaping Use: Never used  Substance and Sexual Activity   Alcohol use: Yes    Alcohol/week: 7.0 standard drinks of alcohol    Types: 7 Glasses of wine per week    Comment: occasional   Drug use: No   Sexual activity: Not on file  Other Topics Concern  Not on file  Social History Narrative   Lives at home w/ her husband   Patient drinks 1-2 cup of caffeine daily.   Patient is right handed.   Social Determinants of Health   Financial Resource Strain: Not on file  Food Insecurity: Not on file  Transportation Needs: Not on file  Physical Activity: Not on file  Stress: Not on file  Social Connections: Not on file  Intimate Partner Violence: Not on file      Levert Feinstein, M.D. Ph.D.  [facility] [address] [place], [place] [address] Ph: [phone] Fax: [phone]  CC:  Geoffry Paradise, MD [address] [place],  [place] [address]  Geoffry Paradise, MD

## 2022-09-19 ENCOUNTER — Ambulatory Visit: Payer: Medicare Other | Attending: Internal Medicine

## 2022-09-19 DIAGNOSIS — I442 Atrioventricular block, complete: Secondary | ICD-10-CM | POA: Diagnosis not present

## 2022-09-19 DIAGNOSIS — R0600 Dyspnea, unspecified: Secondary | ICD-10-CM

## 2022-09-19 NOTE — Progress Notes (Signed)
Pacemaker check in clinic. Normal device function. Thresholds, sensing, impedances consistent with previous measurements. Device programmed to maximize longevity. No mode switch or high ventricular rates noted. Device programmed at appropriate safety margins. Histogram distribution appropriate for patient activity level. Device programmed to optimize intrinsic conduction. Estimated longevity 4 months. Monthly battery remotes scheduled

## 2022-09-20 ENCOUNTER — Ambulatory Visit: Payer: Medicare Other | Attending: Internal Medicine

## 2022-09-20 DIAGNOSIS — R0600 Dyspnea, unspecified: Secondary | ICD-10-CM | POA: Diagnosis not present

## 2022-09-20 DIAGNOSIS — Z1231 Encounter for screening mammogram for malignant neoplasm of breast: Secondary | ICD-10-CM | POA: Diagnosis not present

## 2022-09-20 DIAGNOSIS — I442 Atrioventricular block, complete: Secondary | ICD-10-CM | POA: Diagnosis not present

## 2022-09-21 LAB — ECHOCARDIOGRAM COMPLETE
AR max vel: 1.96 cm2
AV Area VTI: 1.98 cm2
AV Area mean vel: 1.92 cm2
AV Mean grad: 3 mmHg
AV Peak grad: 5.6 mmHg
Ao pk vel: 1.18 m/s
Area-P 1/2: 2.62 cm2
Calc EF: 58.8 %
S' Lateral: 3.3 cm
Single Plane A2C EF: 60 %
Single Plane A4C EF: 59.6 %

## 2022-09-24 ENCOUNTER — Ambulatory Visit (INDEPENDENT_AMBULATORY_CARE_PROVIDER_SITE_OTHER): Payer: Medicare Other

## 2022-09-24 DIAGNOSIS — I442 Atrioventricular block, complete: Secondary | ICD-10-CM | POA: Diagnosis not present

## 2022-09-27 ENCOUNTER — Telehealth: Payer: Self-pay | Admitting: Internal Medicine

## 2022-09-27 LAB — CUP PACEART REMOTE DEVICE CHECK
Battery Remaining Longevity: 3 mo
Battery Voltage: 2.85 V
Brady Statistic AP VP Percent: 8.6 %
Brady Statistic AP VS Percent: 0 %
Brady Statistic AS VP Percent: 91.38 %
Brady Statistic AS VS Percent: 0.02 %
Brady Statistic RA Percent Paced: 8.6 %
Brady Statistic RV Percent Paced: 99.98 %
Date Time Interrogation Session: 20240516112309
Implantable Lead Connection Status: 753985
Implantable Lead Connection Status: 753985
Implantable Lead Implant Date: 20140818
Implantable Lead Implant Date: 20140818
Implantable Lead Location: 753859
Implantable Lead Location: 753860
Implantable Lead Model: 5076
Implantable Lead Model: 5076
Implantable Pulse Generator Implant Date: 20140818
Lead Channel Impedance Value: 323 Ohm
Lead Channel Impedance Value: 437 Ohm
Lead Channel Impedance Value: 513 Ohm
Lead Channel Impedance Value: 532 Ohm
Lead Channel Pacing Threshold Amplitude: 0.625 V
Lead Channel Pacing Threshold Amplitude: 1.125 V
Lead Channel Pacing Threshold Pulse Width: 0.4 ms
Lead Channel Pacing Threshold Pulse Width: 0.4 ms
Lead Channel Sensing Intrinsic Amplitude: 1.125 mV
Lead Channel Sensing Intrinsic Amplitude: 1.125 mV
Lead Channel Sensing Intrinsic Amplitude: 14.75 mV
Lead Channel Sensing Intrinsic Amplitude: 14.75 mV
Lead Channel Setting Pacing Amplitude: 2.25 V
Lead Channel Setting Pacing Amplitude: 2.5 V
Lead Channel Setting Pacing Pulse Width: 0.4 ms
Lead Channel Setting Sensing Sensitivity: 0.9 mV
Zone Setting Status: 755011

## 2022-09-27 NOTE — Telephone Encounter (Signed)
Patient is calling to follow on her Echo results.

## 2022-09-27 NOTE — Telephone Encounter (Signed)
Patient called asking for her echocardiogram test results. Advised pt Dr. Marikay Alar has not reviewed the test results, when he does the office will call her. Patient voiced understanding.

## 2022-10-18 ENCOUNTER — Telehealth: Payer: Self-pay | Admitting: Internal Medicine

## 2022-10-18 NOTE — Telephone Encounter (Signed)
Pt c/o Shortness Of Breath: STAT if SOB developed within the last 24 hours or pt is noticeably SOB on the phone  1. Are you currently SOB (can you hear that pt is SOB on the phone)? Yes   2. How long have you been experiencing SOB? A week  3. Are you SOB when sitting or when up moving around? When moving around  4. Are you currently experiencing any other symptoms? Dizziness, lightheadedness, pre-syncope

## 2022-10-18 NOTE — Telephone Encounter (Signed)
Spoke with pt and advised of information below.  Encouraged pt to seek care in [place] if symptoms continue or worsen.  Pt verbalizes understanding and agrees with recommendation.

## 2022-10-18 NOTE — Telephone Encounter (Signed)
Remote transmission received and reviewed 10/18/22. Shows normal device function. No episodes noted. Nothing that correlates with patient symptoms.

## 2022-10-18 NOTE — Telephone Encounter (Signed)
  Spoke with pt who complains of increasing dyspnea on exertion over the past week.  Pt denies CP or edema.  Also complains of lightheadedness/dizziness with nausea very notable "around her eyes" and when she turns her head.  Current BP 122/80 and HR -76.  Pt states she is currently in [place] with her daughter who is dealing with health problems.  Pt is concerned because she is 3 months to ERI.  She is scheduled for monthly remotes but wonders if her pacemaker battery has depleted.   According to dr Odessa Fleming last note 06/2022 pt has had complaints of ongoing SOB.  Reviewed pt's last echo 09/2022 which shows near normal and improved heart muscle function per Dr Graciela Husbands. Provided education on pacemaker battery replacement.  Pt will send a transmission to be reviewed.  Recommended pt try her Meclizine to see if this helps with her dizziness and lightheadedness.  She states she will have to purchase OTC as she does not have any with her.  Pt verbalizes understanding and agrees with current plan.

## 2022-10-22 NOTE — Progress Notes (Signed)
Remote pacemaker transmission.   

## 2022-10-25 ENCOUNTER — Ambulatory Visit (INDEPENDENT_AMBULATORY_CARE_PROVIDER_SITE_OTHER): Payer: Medicare Other

## 2022-10-25 DIAGNOSIS — I442 Atrioventricular block, complete: Secondary | ICD-10-CM | POA: Diagnosis not present

## 2022-10-25 LAB — CUP PACEART REMOTE DEVICE CHECK
Battery Remaining Longevity: 2 mo
Battery Voltage: 2.84 V
Brady Statistic AP VP Percent: 10.22 %
Brady Statistic AP VS Percent: 0 %
Brady Statistic AS VP Percent: 89.77 %
Brady Statistic AS VS Percent: 0.02 %
Brady Statistic RA Percent Paced: 10.22 %
Brady Statistic RV Percent Paced: 99.98 %
Date Time Interrogation Session: 20240613082836
Implantable Lead Connection Status: 753985
Implantable Lead Connection Status: 753985
Implantable Lead Implant Date: 20140818
Implantable Lead Implant Date: 20140818
Implantable Lead Location: 753859
Implantable Lead Location: 753860
Implantable Lead Model: 5076
Implantable Lead Model: 5076
Implantable Pulse Generator Implant Date: 20140818
Lead Channel Impedance Value: 323 Ohm
Lead Channel Impedance Value: 437 Ohm
Lead Channel Impedance Value: 513 Ohm
Lead Channel Impedance Value: 532 Ohm
Lead Channel Pacing Threshold Amplitude: 0.5 V
Lead Channel Pacing Threshold Amplitude: 1.25 V
Lead Channel Pacing Threshold Pulse Width: 0.4 ms
Lead Channel Pacing Threshold Pulse Width: 0.4 ms
Lead Channel Sensing Intrinsic Amplitude: 1.125 mV
Lead Channel Sensing Intrinsic Amplitude: 1.125 mV
Lead Channel Sensing Intrinsic Amplitude: 14.75 mV
Lead Channel Sensing Intrinsic Amplitude: 14.75 mV
Lead Channel Setting Pacing Amplitude: 2.5 V
Lead Channel Setting Pacing Amplitude: 2.5 V
Lead Channel Setting Pacing Pulse Width: 0.4 ms
Lead Channel Setting Sensing Sensitivity: 0.9 mV
Zone Setting Status: 755011

## 2022-11-12 ENCOUNTER — Telehealth: Payer: Self-pay

## 2022-11-12 NOTE — Telephone Encounter (Signed)
The patient LMOVM stating she got a message about an abnormal check. She would like a call back. Her phone number  is [phone].

## 2022-11-12 NOTE — Telephone Encounter (Signed)
Spoke to patient about the abnormal remote refers to device is estimated 2 months until ERI. Advised that is normal and we are checking her battery monthly. Appreciative of call.

## 2022-11-19 NOTE — Progress Notes (Signed)
Remote pacemaker transmission.   

## 2022-11-23 ENCOUNTER — Ambulatory Visit (INDEPENDENT_AMBULATORY_CARE_PROVIDER_SITE_OTHER): Payer: Medicare Other

## 2022-11-23 ENCOUNTER — Ambulatory Visit: Payer: Medicare Other

## 2022-11-23 DIAGNOSIS — I442 Atrioventricular block, complete: Secondary | ICD-10-CM

## 2022-11-23 LAB — CUP PACEART REMOTE DEVICE CHECK
Battery Remaining Longevity: 2 mo
Battery Voltage: 2.84 V
Brady Statistic AP VP Percent: 4.69 %
Brady Statistic AP VS Percent: 0 %
Brady Statistic AS VP Percent: 95.28 %
Brady Statistic AS VS Percent: 0.02 %
Brady Statistic RA Percent Paced: 4.69 %
Brady Statistic RV Percent Paced: 99.98 %
Date Time Interrogation Session: 20240712074229
Implantable Lead Connection Status: 753985
Implantable Lead Connection Status: 753985
Implantable Lead Implant Date: 20140818
Implantable Lead Implant Date: 20140818
Implantable Lead Location: 753859
Implantable Lead Location: 753860
Implantable Lead Model: 5076
Implantable Lead Model: 5076
Implantable Pulse Generator Implant Date: 20140818
Lead Channel Impedance Value: 323 Ohm
Lead Channel Impedance Value: 437 Ohm
Lead Channel Impedance Value: 532 Ohm
Lead Channel Impedance Value: 551 Ohm
Lead Channel Pacing Threshold Amplitude: 0.75 V
Lead Channel Pacing Threshold Amplitude: 1.25 V
Lead Channel Pacing Threshold Pulse Width: 0.4 ms
Lead Channel Pacing Threshold Pulse Width: 0.4 ms
Lead Channel Sensing Intrinsic Amplitude: 1.25 mV
Lead Channel Sensing Intrinsic Amplitude: 1.25 mV
Lead Channel Sensing Intrinsic Amplitude: 14.75 mV
Lead Channel Sensing Intrinsic Amplitude: 14.75 mV
Lead Channel Setting Pacing Amplitude: 2.5 V
Lead Channel Setting Pacing Amplitude: 2.5 V
Lead Channel Setting Pacing Pulse Width: 0.4 ms
Lead Channel Setting Sensing Sensitivity: 0.9 mV
Zone Setting Status: 755011

## 2022-12-07 NOTE — Progress Notes (Signed)
Remote pacemaker transmission.   

## 2022-12-11 ENCOUNTER — Telehealth: Payer: Self-pay | Admitting: Internal Medicine

## 2022-12-11 NOTE — Telephone Encounter (Signed)
  1. Has your device fired?   2. Is you device beeping?   3. Are you experiencing draining or swelling at device site?   4. Are you calling to see if we received your device transmission?   5. Have you passed out?  Patient wanted to talk to someone in the device clinic, concerning her transmission results on My Chart  Please route to Device Clinic Pool

## 2022-12-11 NOTE — Telephone Encounter (Signed)
Patient had question about 2 months on her battery. Patient was appreciative of call back to clarify.

## 2022-12-17 ENCOUNTER — Encounter: Payer: Self-pay | Admitting: Internal Medicine

## 2022-12-17 DIAGNOSIS — M545 Low back pain, unspecified: Secondary | ICD-10-CM | POA: Diagnosis not present

## 2022-12-17 DIAGNOSIS — H8190 Unspecified disorder of vestibular function, unspecified ear: Secondary | ICD-10-CM | POA: Diagnosis not present

## 2022-12-17 DIAGNOSIS — Z1339 Encounter for screening examination for other mental health and behavioral disorders: Secondary | ICD-10-CM | POA: Diagnosis not present

## 2022-12-17 DIAGNOSIS — E039 Hypothyroidism, unspecified: Secondary | ICD-10-CM | POA: Diagnosis not present

## 2022-12-17 DIAGNOSIS — E785 Hyperlipidemia, unspecified: Secondary | ICD-10-CM | POA: Diagnosis not present

## 2022-12-17 DIAGNOSIS — Z1331 Encounter for screening for depression: Secondary | ICD-10-CM | POA: Diagnosis not present

## 2022-12-17 DIAGNOSIS — Z0000 Encounter for general adult medical examination without abnormal findings: Secondary | ICD-10-CM | POA: Diagnosis not present

## 2022-12-17 DIAGNOSIS — R82998 Other abnormal findings in urine: Secondary | ICD-10-CM | POA: Diagnosis not present

## 2022-12-17 DIAGNOSIS — D692 Other nonthrombocytopenic purpura: Secondary | ICD-10-CM | POA: Diagnosis not present

## 2022-12-24 ENCOUNTER — Ambulatory Visit (INDEPENDENT_AMBULATORY_CARE_PROVIDER_SITE_OTHER): Payer: Medicare Other

## 2022-12-24 ENCOUNTER — Ambulatory Visit: Payer: Medicare Other

## 2022-12-24 DIAGNOSIS — I442 Atrioventricular block, complete: Secondary | ICD-10-CM

## 2022-12-26 ENCOUNTER — Telehealth: Payer: Self-pay

## 2022-12-26 NOTE — Telephone Encounter (Signed)
Scheduled remote reviewed. Normal device function.   The device reached the elective replacement indicator on 12/21/2022, sent to triage Next remote 01/24/2023.  Hassell Halim, RN, CCDS, [employer]    Spoke with patient. Notified of ERI status. Reached ERI 12/21/22. Patient will be in [place] the next few weeks for personal reasons. Routed to scheduling to get patient scheduled. Pt reports some increased palpitations over the last week. Remote transmission received. Device functioning WNL.

## 2023-01-08 NOTE — Progress Notes (Signed)
Remote pacemaker transmission.   

## 2023-01-18 ENCOUNTER — Ambulatory Visit: Payer: Medicare Other | Attending: Student | Admitting: Student

## 2023-01-18 ENCOUNTER — Other Ambulatory Visit: Payer: Self-pay

## 2023-01-18 ENCOUNTER — Encounter: Payer: Self-pay | Admitting: Student

## 2023-01-18 ENCOUNTER — Encounter: Payer: Self-pay | Admitting: *Deleted

## 2023-01-18 VITALS — BP 148/90 | HR 67 | Ht 67.0 in | Wt 164.4 lb

## 2023-01-18 DIAGNOSIS — Z950 Presence of cardiac pacemaker: Secondary | ICD-10-CM | POA: Insufficient documentation

## 2023-01-18 DIAGNOSIS — I442 Atrioventricular block, complete: Secondary | ICD-10-CM | POA: Diagnosis not present

## 2023-01-18 LAB — CUP PACEART INCLINIC DEVICE CHECK
Battery Remaining Longevity: 2 mo
Battery Voltage: 2.83 V
Brady Statistic AP VP Percent: 5.8 %
Brady Statistic AP VS Percent: 0 %
Brady Statistic AS VP Percent: 94.17 %
Brady Statistic AS VS Percent: 0.02 %
Brady Statistic RA Percent Paced: 5.8 %
Brady Statistic RV Percent Paced: 99.98 %
Date Time Interrogation Session: 20240906124616
Implantable Lead Connection Status: 753985
Implantable Lead Connection Status: 753985
Implantable Lead Implant Date: 20140818
Implantable Lead Implant Date: 20140818
Implantable Lead Location: 753859
Implantable Lead Location: 753860
Implantable Lead Model: 5076
Implantable Lead Model: 5076
Implantable Pulse Generator Implant Date: 20140818
Lead Channel Impedance Value: 342 Ohm
Lead Channel Impedance Value: 475 Ohm
Lead Channel Impedance Value: 589 Ohm
Lead Channel Impedance Value: 646 Ohm
Lead Channel Pacing Threshold Amplitude: 0.875 V
Lead Channel Pacing Threshold Amplitude: 1.125 V
Lead Channel Pacing Threshold Pulse Width: 0.4 ms
Lead Channel Pacing Threshold Pulse Width: 0.4 ms
Lead Channel Sensing Intrinsic Amplitude: 1.375 mV
Lead Channel Sensing Intrinsic Amplitude: 1.625 mV
Lead Channel Sensing Intrinsic Amplitude: 14.75 mV
Lead Channel Sensing Intrinsic Amplitude: 14.75 mV
Lead Channel Setting Pacing Amplitude: 2.25 V
Lead Channel Setting Pacing Amplitude: 2.5 V
Lead Channel Setting Pacing Pulse Width: 0.4 ms
Lead Channel Setting Sensing Sensitivity: 0.9 mV
Zone Setting Status: 755011

## 2023-01-18 NOTE — Progress Notes (Signed)
  Electrophysiology Office Note:   ID:  [name] 1946-02-06, MRN [id number]  Primary Cardiologist: None Electrophysiologist: Sherryl Manges, MD      History of Present Illness:   [name] is a 77 y.o. female with h/o advanced AV block s/p MDT PPM, atypical chest pain, central polyneuropathy, and possible dysautonomia seen today for routine electrophysiology followup.   Pacemaker alert received for device at [facility] as of 12/21/2022  Since last being seen in our clinic the patient reports doing OK overall. She had recent dental surgery 9/4 for a tooth "resorption" and remains on ABx, to finish in several days.  She has mild SOB with moderate exertion. Otherwise, she denies chest pain, palpitations, PND, orthopnea, nausea, vomiting, dizziness, syncope, or edema.   Review of systems complete and found to be negative unless listed in HPI.   EP Information / Studies Reviewed:    EKG is ordered today. Personal review as below.  A-sensed V paced rhythm at 67 bpm. (MUSE not crossing)  PPM Interrogation-  reviewed in detail today,  See PACEART report.  Device History: Medtronic Dual Chamber PPM implanted 2014 for CHB   Echocardiogram 09/20/2022 LVEF 55-60%, Grade 1 DD, Normal RV  Physical Exam:   VS:  BP (!) 148/90   Pulse 67   Ht 5\' 7"  (1.702 m)   Wt 164 lb 6.4 oz (74.6 kg)   SpO2 98%   BMI 25.75 kg/m    Wt Readings from Last 3 Encounters:  01/18/23 164 lb 6.4 oz (74.6 kg)  06/20/22 179 lb 9.6 oz (81.5 kg)  01/16/22 188 lb (85.3 kg)     GEN: Well nourished, well developed in no acute distress NECK: No JVD; No carotid bruits CARDIAC: Regular rate and rhythm, no murmurs, rubs, gallops RESPIRATORY:  Clear to auscultation without rales, wheezing or rhonchi  ABDOMEN: Soft, non-tender, non-distended EXTREMITIES:  No edema; No deformity   ASSESSMENT AND PLAN:    CHB s/p Medtronic PPM  Normal PPM function for device at ERI as of 12/21/22 See Pace Art report No  changes today  Dyspnea on Exertion Chronic component Recent Echo with normal EF  Exercise associated hypotension ? Dysautonomia Chronic, stable.   Recent dental surgery Due to "resorption" had dental extraction 9/4. Will space gen change out 6 weeks from surgery and confirm with Dr. Graciela Husbands he is agreeable.   Disposition:   Follow up with Dr. Graciela Husbands as usual post procedure  Signed, Graciella Freer, PA-C

## 2023-01-18 NOTE — Patient Instructions (Signed)
Medication Instructions:  Your physician recommends that you continue on your current medications as directed. Please refer to the Current Medication list given to you today.  *If you need a refill on your cardiac medications before your next appointment, please call your pharmacy*  Lab Work: BMET, CBC-TODAY If you have labs (blood work) drawn today and your tests are completely normal, you will receive your results only by: MyChart Message (if you have MyChart) OR A paper copy in the mail If you have any lab test that is abnormal or we need to change your treatment, we will call you to review the results.  Testing/Procedures: See instruction letter  Follow-Up: At [facility], you and your health needs are our priority.  As part of our continuing mission to provide you with exceptional heart care, we have created designated Provider Care Teams.  These Care Teams include your primary Cardiologist (physician) and Advanced Practice Providers (APPs -  Physician Assistants and Nurse Practitioners) who all work together to provide you with the care you need, when you need it.  Your next appointment:   Follow up will be scheduled for you and print out on your discharge summary after your procedure.

## 2023-01-19 LAB — CBC
Hematocrit: 44.4 % (ref 34.0–46.6)
Hemoglobin: 14.4 g/dL (ref 11.1–15.9)
MCH: 31.4 pg (ref 26.6–33.0)
MCHC: 32.4 g/dL (ref 31.5–35.7)
MCV: 97 fL (ref 79–97)
Platelets: 231 10*3/uL (ref 150–450)
RBC: 4.58 x10E6/uL (ref 3.77–5.28)
RDW: 13.7 % (ref 11.7–15.4)
WBC: 6.7 10*3/uL (ref 3.4–10.8)

## 2023-01-19 LAB — BASIC METABOLIC PANEL
BUN/Creatinine Ratio: 11 — ABNORMAL LOW (ref 12–28)
BUN: 8 mg/dL (ref 8–27)
CO2: 25 mmol/L (ref 20–29)
Calcium: 9.2 mg/dL (ref 8.7–10.3)
Chloride: 104 mmol/L (ref 96–106)
Creatinine, Ser: 0.72 mg/dL (ref 0.57–1.00)
Glucose: 85 mg/dL (ref 70–99)
Potassium: 3.4 mmol/L — ABNORMAL LOW (ref 3.5–5.2)
Sodium: 143 mmol/L (ref 134–144)
eGFR: 86 mL/min/{1.73_m2} (ref >59)

## 2023-01-21 ENCOUNTER — Telehealth: Payer: Self-pay | Admitting: *Deleted

## 2023-01-21 ENCOUNTER — Telehealth: Payer: Self-pay | Admitting: Internal Medicine

## 2023-01-21 DIAGNOSIS — E876 Hypokalemia: Secondary | ICD-10-CM

## 2023-01-21 MED ORDER — POTASSIUM CHLORIDE CRYS ER 10 MEQ PO TBCR: 20.0000 meq | EXTENDED_RELEASE_TABLET | Freq: Every day | ORAL | Status: DC

## 2023-01-21 NOTE — Telephone Encounter (Signed)
I spoke with patient and reviewed lab results with her.  She is taking potassium 10 meq daily as ordered.  She will make changes and come in for BMP on 9/13.  Will send new prescription to pharmacy after 9/13 lab results are available.

## 2023-01-21 NOTE — Telephone Encounter (Signed)
Per Otilio Saber PA, If she is, would have her take a total of 30 meq x 1 on the day she gets this, and then increase chronic dosing to 20 meq daily with repeat later this week.   Called patient and went over again [employer] from patient's lab results today. Patient verbalized understanding and will get lab work repeated on Friday.

## 2023-01-21 NOTE — Telephone Encounter (Signed)
-----   Message from Mariam Dollar Tillery sent at 01/21/2023  7:19 AM EDT ----- Please make sure she is taking her potassium as ordered.   If she is, would have her take a total of 30 meq x 1 on the day she gets this, and then increase chronic dosing to 20 meq daily with repeat later this week.

## 2023-01-21 NOTE — Telephone Encounter (Signed)
Pt following up to ask how much potassium she is supposed to be taking. I think she just needs some guidance with the correct amount daily. Please advise

## 2023-01-24 ENCOUNTER — Ambulatory Visit (INDEPENDENT_AMBULATORY_CARE_PROVIDER_SITE_OTHER): Payer: Medicare Other

## 2023-01-24 DIAGNOSIS — I442 Atrioventricular block, complete: Secondary | ICD-10-CM

## 2023-01-24 LAB — CUP PACEART REMOTE DEVICE CHECK
Battery Remaining Longevity: 2 mo
Battery Voltage: 2.82 V
Brady Statistic AP VP Percent: 2.93 %
Brady Statistic AP VS Percent: 0 %
Brady Statistic AS VP Percent: 97.05 %
Brady Statistic AS VS Percent: 0.02 %
Brady Statistic RA Percent Paced: 2.93 %
Brady Statistic RV Percent Paced: 99.98 %
Date Time Interrogation Session: 20240912151636
Implantable Lead Connection Status: 753985
Implantable Lead Connection Status: 753985
Implantable Lead Implant Date: 20140818
Implantable Lead Implant Date: 20140818
Implantable Lead Location: 753859
Implantable Lead Location: 753860
Implantable Lead Model: 5076
Implantable Lead Model: 5076
Implantable Pulse Generator Implant Date: 20140818
Lead Channel Impedance Value: 323 Ohm
Lead Channel Impedance Value: 437 Ohm
Lead Channel Impedance Value: 513 Ohm
Lead Channel Impedance Value: 551 Ohm
Lead Channel Pacing Threshold Amplitude: 0.75 V
Lead Channel Pacing Threshold Amplitude: 1.375 V
Lead Channel Pacing Threshold Pulse Width: 0.4 ms
Lead Channel Pacing Threshold Pulse Width: 0.4 ms
Lead Channel Sensing Intrinsic Amplitude: 1.125 mV
Lead Channel Sensing Intrinsic Amplitude: 1.125 mV
Lead Channel Sensing Intrinsic Amplitude: 14.75 mV
Lead Channel Sensing Intrinsic Amplitude: 14.75 mV
Lead Channel Setting Pacing Amplitude: 2.5 V
Lead Channel Setting Pacing Amplitude: 2.75 V
Lead Channel Setting Pacing Pulse Width: 0.4 ms
Lead Channel Setting Sensing Sensitivity: 0.9 mV
Zone Setting Status: 755011

## 2023-01-25 ENCOUNTER — Ambulatory Visit: Payer: Medicare Other | Attending: Internal Medicine

## 2023-01-25 DIAGNOSIS — E876 Hypokalemia: Secondary | ICD-10-CM

## 2023-01-26 LAB — BASIC METABOLIC PANEL
BUN/Creatinine Ratio: 13 (ref 12–28)
BUN: 11 mg/dL (ref 8–27)
CO2: 21 mmol/L (ref 20–29)
Calcium: 9.4 mg/dL (ref 8.7–10.3)
Chloride: 104 mmol/L (ref 96–106)
Creatinine, Ser: 0.83 mg/dL (ref 0.57–1.00)
Glucose: 99 mg/dL (ref 70–99)
Potassium: 4 mmol/L (ref 3.5–5.2)
Sodium: 142 mmol/L (ref 134–144)
eGFR: 72 mL/min/{1.73_m2} (ref >59)

## 2023-02-04 ENCOUNTER — Telehealth: Payer: Self-pay | Admitting: Internal Medicine

## 2023-02-04 ENCOUNTER — Encounter: Payer: Self-pay | Admitting: Internal Medicine

## 2023-02-04 NOTE — Telephone Encounter (Signed)
Spoke with pt who reports she has been struggling with low BP since Saturday.  See BP's from today.  Pt has known orthostatic hypotension.  She reports she is taking Florinef as prescribed.  She is wearing compression pants and drinks 4 bottles of water daily.  Pt states she ate some applesauce and a small piece of pumpkin bread for lunch.  Pt afraid her PPM being at [facility] is causing her low BP.   Offered reassurance that her PPM being at [facility] would not cause her BP to be low.  Pt advised if she feels like she is going to pass out she should sit down wherever she is.  Advised pt to have a salty snack of chips, crackers,sports drink with extra sodium. to help raise blood pressure.  Continue Florinef and compression.  Continue to monitor BP and change positions slowly.  Reviewed ED precautions.  Will forward to Maryella Shivers to advise further.  Pt is scheduled for generator change on 02/27/2023 with Dr Graciela Husbands.

## 2023-02-04 NOTE — Telephone Encounter (Signed)
Pt c/o BP issue: STAT if pt c/o blurred vision, one-sided weakness or slurred speech  1. What are your last 5 BP readings? 88/66, 86/77, 96/70, 88/66 - All from today  2. Are you having any other symptoms (ex. Dizziness, headache, blurred vision, passed out)? Lightheadedness and feeling like going to pass out  3. What is your BP issue? Pt states that BP has been running low since Saturday. She has concerns being that she ha scheduled upcoming procedure on 10/16

## 2023-02-05 ENCOUNTER — Telehealth: Payer: Self-pay | Admitting: Physician Assistant

## 2023-02-05 NOTE — Progress Notes (Signed)
Remote pacemaker transmission.   

## 2023-02-05 NOTE — Telephone Encounter (Signed)
Reviewed transmission RRT but not  She explains that her daughter unexpectedly passed away 5 weeks ago, has quickly lost weight but trying to eat better, keep hydrated and pauses sodium Last weekend in the shower got lightheaded and nearly fainted. Since then noted marked dizziness particularly when standing up get clammy, very weak, lightheaded and has to sit back down No dizziness, or symptoms seated or supine. Reported BPs are done while standing While seated 103/67, 73bpm She does have support/compression hose that ar thigh high No CP, palpitations  She has been only on once daily florinef for years Advised to increase to BID and monitor BP/ symptoms If BP becomes elevated scale back to once daily Though if unable to improve with increased florinef,her compression, hydration, salt repletion, she may need IV hydration to seek attention if no improvement or worse symptoms  Suspect with the rapid weight loss, triggered her dysautonomia   Francis Dowse, PA-C

## 2023-02-05 NOTE — Telephone Encounter (Signed)
Device reached RRT 12/21/2022. Based on voltage of 2.82 V, replace device is recommended.

## 2023-02-05 NOTE — Telephone Encounter (Signed)
See MyChart message dated 02/04/2023.  Pt agreeable to send device transmission for review.

## 2023-02-08 NOTE — Telephone Encounter (Signed)
Pt continues to be very symptomatic with low BP.  Please see MyChart encounter dated 9/23 for additional information.  Appointment scheduled for pt with Dr Graciela Husbands on 02/11/2023.  Pt will continue current medications and measures to raise blood pressure.

## 2023-02-11 ENCOUNTER — Encounter: Payer: Self-pay | Admitting: Internal Medicine

## 2023-02-11 ENCOUNTER — Ambulatory Visit: Payer: Medicare Other | Attending: Internal Medicine | Admitting: Internal Medicine

## 2023-02-11 VITALS — BP 90/64 | HR 75 | Ht 67.0 in | Wt 160.6 lb

## 2023-02-11 DIAGNOSIS — I442 Atrioventricular block, complete: Secondary | ICD-10-CM

## 2023-02-11 DIAGNOSIS — I951 Orthostatic hypotension: Secondary | ICD-10-CM

## 2023-02-11 DIAGNOSIS — Z79899 Other long term (current) drug therapy: Secondary | ICD-10-CM

## 2023-02-11 DIAGNOSIS — R0609 Other forms of dyspnea: Secondary | ICD-10-CM

## 2023-02-11 DIAGNOSIS — Z950 Presence of cardiac pacemaker: Secondary | ICD-10-CM | POA: Diagnosis not present

## 2023-02-11 LAB — BASIC METABOLIC PANEL
BUN/Creatinine Ratio: 12 (ref 12–28)
BUN: 9 mg/dL (ref 8–27)
CO2: 26 mmol/L (ref 20–29)
Calcium: 9.4 mg/dL (ref 8.7–10.3)
Chloride: 105 mmol/L (ref 96–106)
Creatinine, Ser: 0.76 mg/dL (ref 0.57–1.00)
Glucose: 79 mg/dL (ref 70–99)
Potassium: 3.6 mmol/L (ref 3.5–5.2)
Sodium: 143 mmol/L (ref 134–144)
eGFR: 80 mL/min/{1.73_m2} (ref >59)

## 2023-02-11 MED ORDER — MIDODRINE HCL 5 MG PO TABS: 5.0000 mg | ORAL_TABLET | Freq: Three times a day (TID) | ORAL | 3 refills | Status: DC

## 2023-02-11 NOTE — Progress Notes (Signed)
A and a and     Patient Care Team: Geoffry Paradise, MD as PCP - General (Internal Medicine) Duke Salvia, MD as PCP - Electrophysiology (Cardiology) Milagros Evener, MD as Consulting Physician (Psychiatry)   HPI  [name] is a 77 y.o. female Seen in followup for high grade heart block for which she underwent pacing (2014 Medtronic )compllicated by microperforation modest effusion and then adderall withdrawal; now with complete heart block. Course was also complicated by development of orthostatic hypotension.  Her device is reached ERI and she is scheduled for generator replacement.  She hae been followed at [facility] with Abran Duke. She was last seen 6/19 Salt and proamatine were again recommended with up titration of her dose to 4 times a day.    Had been caring for her daughter I think in [place].  Recently died  Much more severe problems with orthostasis of late; no constipation but has a concomitant diagnosis of collagenous colitis.  Dry mouth which is worsening relatively subacutely over months and chronic dry eyes.  Chronic dyspnea but this has been worse of late.     She reminded that she has a diagnosis of central polyneuropathy;        DATE TEST EF   9/14 Echo   55-60 %   12/19 Echo   55-60 %   2/21 CTA  CaScore 2 Min CAD  7/21 Echo  50-55% Pericardial effusion Trivial    Date Cr K Hgb  6/20 0.8 4.0 13.8                  Past Medical History:  Diagnosis Date   ADHD (attention deficit hyperactivity disorder)    Allergy    trees/pollen, mold, fungus, dust mites. Takes allergy shots   Arthritis    PAIN AND OA LEFT HIP   Asthma    allergist Dr Illene Labrador- monthly allergy injections   Autonomic dysfunction    CENTRAL NERVOUS SYSTEM NEUROPATHY - DX BY DR. Sandria Manly MORE THAN 10 YRS AGO - AND IT IS FELT TO CONTRIBUTE TO THE AUTOMIC  DYSFUNCTION-- PT HAS NUMBNESS LEGS AND FEET AND SOMETIIMES TIPS OF FINGER, SEVERE CONSTIPATION( NO SENSATION TO HAVE  BM ), DOUBLE VISION, ORTHOSTATIC HYPOTENSION   Blood transfusion    Breast cancer (HCC)    right side   Bruises easily    Cataract    Chest pain    a. 12/2012 Cath: nl cors, EF 55-65%.   Complication of anesthesia    reaction to some anesthetics/ 7/12 anesth record on chart- states prefers epidural   CVA (cerebrovascular accident) (HCC) 03/21/2018   Depression    Diverticulosis    Dysrhythmia    HX OF HIGH GRADE HEART BLOCK - REQUIRED PACEMAKER INSERTION   Esophageal stricture    Gastritis    GERD (gastroesophageal reflux disease)    H/O hiatal hernia    Hemorrhoids    Hernia of abdominal wall    spigelian hernia RLQ - SURGERY TO REPAIR   Hypothyroidism    Interstitial cystitis    Latex allergy, contact dermatitis    Mallory - Weiss tear    HEALED    Neuromuscular disorder (HCC)    central nervous system neuropathy- seen per Dr Sandria Manly   Pacemaker    Pericardial effusion    a. 12/2012 following ppm placement;  b. 01/01/2013 Echo: EF 55-60%, small pericardial effusion w/o RV collapse-->No need for tap/window.   PONV (postoperative nausea and vomiting)    pt  needs scop patch   Recurrent upper respiratory infection (URI) 1/13- to present   bronchitis following surgery- states improved but still with cough. OV with [facility] Dr [name] 09/06/11 on chart   Shortness of breath    AT TIMES - BUT MUCH IMPROVED AFTER PACEMAKER WAS REPROGRAMED.   Symptomatic bradycardia    a. 12/2012 s/p MDT dual chamber PPM, ser # [id number]; b. 12/2012 post-op course complicated by pericardial effusinon req lead revision.   Thyroid disease     Past Surgical History:  Procedure Laterality Date   ABDOMINAL HYSTERECTOMY  1974   BACK SURGERY     cervical fusion 4-5 with plate   BLADDER SUSPENSION     BREAST BIOPSY  2002   NO BLOOD PRESSURES ON RIGHT SIDE/   s/p  axillary node dissection   CYSTO WITH HYDRODISTENSION  09/07/2011   Procedure: CYSTOSCOPY/HYDRODISTENSION;  Surgeon: Kathi Ludwig, MD;   Location: [facility];  Service: Urology;  Laterality: N/A;  INSTILLATION OF MARCAINE/PYRIDIUM INSTILLATION OF MARCAINE/KENALOG   CYSTOSCOPY  1975, 2007   EYE SURGERY     LASIK EYE SURGERY BILATERAL   LAPAROSCOPY  1973   LEAD REVISION N/A 12/30/2012   Procedure: LEAD REVISION;  Surgeon: Marinus Maw, MD;  Location: [facility] CATH LAB;  Service: Cardiovascular;  Laterality: N/A;   LEFT HEART CATHETERIZATION WITH CORONARY ANGIOGRAM N/A 12/29/2012   Procedure: LEFT HEART CATHETERIZATION WITH CORONARY ANGIOGRAM;  Surgeon: Peter M Swaziland, MD;  Location: [facility] CATH LAB;  Service: Cardiovascular;  Laterality: N/A;   MASTECTOMY MODIFIED RADICAL     right; with immediate reconstruction   MYRINGOPLASTY  1962   NECK SURGERY     c4-5 ruptured disc   OOPHORECTOMY  1982   PACEMAKER INSERTION     PERMANENT PACEMAKER INSERTION N/A 12/29/2012   Procedure: PERMANENT PACEMAKER INSERTION;  Surgeon: Duke Salvia, MD;  Location: [facility] CATH LAB;  Service: Cardiovascular;  Laterality: N/A;   RECTOCELE REPAIR     with cystocele repair   TONSILLECTOMY     TOTAL HIP ARTHROPLASTY Right    TOTAL HIP ARTHROPLASTY Left 06/12/2013   Procedure: LEFT TOTAL HIP ARTHROPLASTY ANTERIOR APPROACH;  Surgeon: Kathryne Hitch, MD;  Location: [facility];  Service: Orthopedics;  Laterality: Left;   TYMPANOPLASTY  1973   right   ULNAR NERVE TRANSPOSITION Right    VENTRAL HERNIA REPAIR  05/30/2011   Procedure: HERNIA REPAIR VENTRAL ADULT;  Surgeon: Currie Paris, MD;  Location: [facility];  Service: General;  Laterality: Right;  repair right spigelian hernia   Current Meds  Medication Sig   acetaminophen (TYLENOL) 500 MG tablet Take 1,000 mg by mouth 2 (two) times daily.   aspirin EC 81 MG EC tablet Take 1 tablet (81 mg total) by mouth daily.   budesonide (ENTOCORT EC) 3 MG 24 hr capsule Take 3 mg by mouth daily.   Calcium Carbonate-Vitamin D (CALTRATE 600+D PO) Take 1 tablet by mouth daily.   cetirizine (ZYRTEC) 10 MG  tablet Take 10 mg by mouth 2 (two) times daily.   cholecalciferol (VITAMIN D) 1000 UNITS tablet Take 1,000 Units by mouth daily.   cycloSPORINE (RESTASIS) 0.05 % ophthalmic emulsion Place 1 drop into both eyes 2 (two) times daily.   diclofenac Sodium (VOLTAREN) 1 % GEL Apply 1 application  topically daily as needed (pain).   DULoxetine (CYMBALTA) 30 MG capsule Take 1 capsule (30 mg total) by mouth daily.   estradiol (ESTRING) 2 MG vaginal ring Place 2  mg vaginally every 3 (three) months. follow package directions   fludrocortisone (FLORINEF) 0.1 MG tablet Take 1 tablet (0.1 mg total) by mouth 2 (two) times daily.   gabapentin (NEURONTIN) 300 MG capsule Take 2 capsules (600 mg total) by mouth 3 (three) times daily. (Patient taking differently: Take 300 mg by mouth at bedtime.)   levothyroxine (SYNTHROID) 50 MCG tablet Take 50 mcg by mouth every other day. Pt takes on Tuesday,Thursday, and Sunday   levothyroxine (SYNTHROID) 75 MCG tablet Take 75 mcg by mouth 4 (four) times a week. Pt takes on Monday,Wednesday,Friday, and Saturday   Magnesium 250 MG TABS Take 250 mg by mouth daily.   meclizine (ANTIVERT) 25 MG tablet Take 25 mg by mouth 3 (three) times daily as needed for dizziness.    MELATONIN PO Take 4 mg by mouth at bedtime.   ondansetron (ZOFRAN) 4 MG tablet Take 4 mg by mouth every 8 (eight) hours as needed for nausea.    potassium chloride (KLOR-CON M) 10 MEQ tablet Take 2 tablets (20 mEq total) by mouth daily.   Probiotic Product (ALIGN) 4 MG CAPS Take 4 mg by mouth at bedtime.    rosuvastatin (CRESTOR) 10 MG tablet Take 1 tablet (10 mg total) by mouth daily. (Patient taking differently: Take 10 mg by mouth at bedtime.)   vitamin B-12 (CYANOCOBALAMIN) 500 MCG tablet Take 500 mcg by mouth daily.     Allergies  Allergen Reactions   Anesthetics, Amide Other (See Comments)    Patient unsure of names, however multiples cause swelling of airway & nausea   Clindamycin Swelling   Shellfish  Allergy Other (See Comments)    Neurotoxic reaction   Sulfonamide Derivatives Anaphylaxis and Swelling   Neomycin Swelling   Zolpidem Tartrate Other (See Comments)    Jerking motions    Azithromycin Other (See Comments)    Severe gastritis    Bactroban Other (See Comments)    Causes sores in nose   Ciprofloxacin     joint swelling   Codeine Swelling   Meperidine Hcl Nausea Only    Hallucinations    Morphine Nausea Only    Hallucinations    Neosporin [Neomycin-Polymyxin-Gramicidin] Hives and Dermatitis    All topical "orin's ointment"   Nitrofurantoin     neuropathy in legs   Other     ALLERGIC TO WARM WATER SHELLFISH     Penicillins Other (See Comments)    Swelling in joints   Percocet [Oxycodone-Acetaminophen]     Dizziness    Tramadol     Makes jerk   Latex Rash    Review of Systems negative except from HPI and PMH  Physical Exam BP 90/64 (BP Location: Left Arm, Patient Position: Sitting, Cuff Size: Normal)   Pulse 75   Ht 5\' 7"  (1.702 m)   Wt 160 lb 9.6 oz (72.8 kg)   SpO2 97%   BMI 25.15 kg/m  Well developed and well nourished in no acute distress HENT normal Neck supple with JVP-flat Clear Device pocket well healed; without hematoma or erythema.  There is no tethering  Regular rate and rhythm, no  gallop No / murmur Abd-soft with active BS No Clubbing cyanosis  edema Skin-warm and dry A & Oriented  Grossly normal sensory and motor function  ECG sinus P-synchronous/ AV  pacing      Assessment and  Plan  Complete heart block  Pacemaker-Medtronic   orthostatic hypotension without heart rate response  Exercise associated hypotension  ? Dysautonomia  Central polyneuropathy  Dyspnea on exertion-worsening  Hiatal hernia  Grief  Device is at ERI.  She will undergo device generator replacement a couple of weeks.  Profound orthostatic hypotension has ensued, notably associated with very little heart rate response.  2 questions are back by  the timing, the first is is there any association between stress reactions i.e. Tako-Tsubo and autonomic issues and secondly are there any associations between the central polyneuropathy and progressive autonomic insufficiency the latter suggested by the lack of heart rate response.  A brief literature search does not reveal acute "Tako-Tsubo like "triggering of autonomic insufficiency.  There are data that patients with Tako-Tsubo (we will see what her echo shows following the emotional catastrophe associate with the abrupt loss of her daughter's life) have underlying propensity to sympathetic over stimulation and decreased parasympathetic modulation.  She is using compression.  Her fludrocortisone was recently increased from 0.1--0.2 milligrams daily.  We may have to increase it further.  In the interim we will resume her midodrine 5 mg 3 times daily.  I will reach out to neurology   She has worsening dyspnea on exertion.  Echocardiogram recently showed normal LV function albeit with an elevated E/E' suggestive of HFpEF.  Right now, given her volume issues and her blood pressure it is going to be hard to diurese her.

## 2023-02-11 NOTE — Patient Instructions (Signed)
Medication Instructions:  Your physician has recommended you make the following change in your medication:   ** Begin Midodrine 5mg  - 1 tablet by mouth three times daily with meals.  *If you need a refill on your cardiac medications before your next appointment, please call your pharmacy*   Lab Work: BMET today If you have labs (blood work) drawn today and your tests are completely normal, you will receive your results only by: MyChart Message (if you have MyChart) OR A paper copy in the mail If you have any lab test that is abnormal or we need to change your treatment, we will call you to review the results.   Testing/Procedures: Your physician has requested that you have an echocardiogram. Echocardiography is a painless test that uses sound waves to create images of your heart. It provides your doctor with information about the size and shape of your heart and how well your heart's chambers and valves are working. This procedure takes approximately one hour. There are no restrictions for this procedure. Please do NOT wear cologne, perfume, aftershave, or lotions (deodorant is allowed). Please arrive 15 minutes prior to your appointment time.    Follow-Up: At [facility], you and your health needs are our priority.  As part of our continuing mission to provide you with exceptional heart care, we have created designated Provider Care Teams.  These Care Teams include your primary Cardiologist (physician) and Advanced Practice Providers (APPs -  Physician Assistants and Nurse Practitioners) who all work together to provide you with the care you need, when you need it.  We recommend signing up for the patient portal called "MyChart".  Sign up information is provided on this After Visit Summary.  MyChart is used to connect with patients for Virtual Visits (Telemedicine).  Patients are able to view lab/test results, encounter notes, upcoming appointments, etc.  Non-urgent messages can  be sent to your provider as well.   To learn more about what you can do with MyChart, go to [email or web address].    Your next appointment:   As scheduled

## 2023-02-11 NOTE — H&P (View-Only) (Signed)
A and a and     Patient Care Team: Geoffry Paradise, MD as PCP - General (Internal Medicine) Duke Salvia, MD as PCP - Electrophysiology (Cardiology) Milagros Evener, MD as Consulting Physician (Psychiatry)   HPI  [name] is a 77 y.o. female Seen in followup for high grade heart block for which she underwent pacing (2014 Medtronic )compllicated by microperforation modest effusion and then adderall withdrawal; now with complete heart block. Course was also complicated by development of orthostatic hypotension.  Her device is reached ERI and she is scheduled for generator replacement.  She hae been followed at [facility] with Abran Duke. She was last seen 6/19 Salt and proamatine were again recommended with up titration of her dose to 4 times a day.    Had been caring for her daughter I think in [place].  Recently died  Much more severe problems with orthostasis of late; no constipation but has a concomitant diagnosis of collagenous colitis.  Dry mouth which is worsening relatively subacutely over months and chronic dry eyes.  Chronic dyspnea but this has been worse of late.     She reminded that she has a diagnosis of central polyneuropathy;        DATE TEST EF   9/14 Echo   55-60 %   12/19 Echo   55-60 %   2/21 CTA  CaScore 2 Min CAD  7/21 Echo  50-55% Pericardial effusion Trivial    Date Cr K Hgb  6/20 0.8 4.0 13.8                  Past Medical History:  Diagnosis Date   ADHD (attention deficit hyperactivity disorder)    Allergy    trees/pollen, mold, fungus, dust mites. Takes allergy shots   Arthritis    PAIN AND OA LEFT HIP   Asthma    allergist Dr Illene Labrador- monthly allergy injections   Autonomic dysfunction    CENTRAL NERVOUS SYSTEM NEUROPATHY - DX BY DR. Sandria Manly MORE THAN 10 YRS AGO - AND IT IS FELT TO CONTRIBUTE TO THE AUTOMIC  DYSFUNCTION-- PT HAS NUMBNESS LEGS AND FEET AND SOMETIIMES TIPS OF FINGER, SEVERE CONSTIPATION( NO SENSATION TO HAVE  BM ), DOUBLE VISION, ORTHOSTATIC HYPOTENSION   Blood transfusion    Breast cancer (HCC)    right side   Bruises easily    Cataract    Chest pain    a. 12/2012 Cath: nl cors, EF 55-65%.   Complication of anesthesia    reaction to some anesthetics/ 7/12 anesth record on chart- states prefers epidural   CVA (cerebrovascular accident) (HCC) 03/21/2018   Depression    Diverticulosis    Dysrhythmia    HX OF HIGH GRADE HEART BLOCK - REQUIRED PACEMAKER INSERTION   Esophageal stricture    Gastritis    GERD (gastroesophageal reflux disease)    H/O hiatal hernia    Hemorrhoids    Hernia of abdominal wall    spigelian hernia RLQ - SURGERY TO REPAIR   Hypothyroidism    Interstitial cystitis    Latex allergy, contact dermatitis    Mallory - Weiss tear    HEALED    Neuromuscular disorder (HCC)    central nervous system neuropathy- seen per Dr Sandria Manly   Pacemaker    Pericardial effusion    a. 12/2012 following ppm placement;  b. 01/01/2013 Echo: EF 55-60%, small pericardial effusion w/o RV collapse-->No need for tap/window.   PONV (postoperative nausea and vomiting)    pt  needs scop patch   Recurrent upper respiratory infection (URI) 1/13- to present   bronchitis following surgery- states improved but still with cough. OV with [facility] Dr [name] 09/06/11 on chart   Shortness of breath    AT TIMES - BUT MUCH IMPROVED AFTER PACEMAKER WAS REPROGRAMED.   Symptomatic bradycardia    a. 12/2012 s/p MDT dual chamber PPM, ser # [id number]; b. 12/2012 post-op course complicated by pericardial effusinon req lead revision.   Thyroid disease     Past Surgical History:  Procedure Laterality Date   ABDOMINAL HYSTERECTOMY  1974   BACK SURGERY     cervical fusion 4-5 with plate   BLADDER SUSPENSION     BREAST BIOPSY  2002   NO BLOOD PRESSURES ON RIGHT SIDE/   s/p  axillary node dissection   CYSTO WITH HYDRODISTENSION  09/07/2011   Procedure: CYSTOSCOPY/HYDRODISTENSION;  Surgeon: Kathi Ludwig, MD;   Location: [facility];  Service: Urology;  Laterality: N/A;  INSTILLATION OF MARCAINE/PYRIDIUM INSTILLATION OF MARCAINE/KENALOG   CYSTOSCOPY  1975, 2007   EYE SURGERY     LASIK EYE SURGERY BILATERAL   LAPAROSCOPY  1973   LEAD REVISION N/A 12/30/2012   Procedure: LEAD REVISION;  Surgeon: Marinus Maw, MD;  Location: [facility];  Service: Cardiovascular;  Laterality: N/A;   LEFT HEART CATHETERIZATION WITH CORONARY ANGIOGRAM N/A 12/29/2012   Procedure: LEFT HEART CATHETERIZATION WITH CORONARY ANGIOGRAM;  Surgeon: Peter M Swaziland, MD;  Location: [facility] CATH LAB;  Service: Cardiovascular;  Laterality: N/A;   MASTECTOMY MODIFIED RADICAL     right; with immediate reconstruction   MYRINGOPLASTY  1962   NECK SURGERY     c4-5 ruptured disc   OOPHORECTOMY  1982   PACEMAKER INSERTION     PERMANENT PACEMAKER INSERTION N/A 12/29/2012   Procedure: PERMANENT PACEMAKER INSERTION;  Surgeon: Duke Salvia, MD;  Location: [facility] CATH LAB;  Service: Cardiovascular;  Laterality: N/A;   RECTOCELE REPAIR     with cystocele repair   TONSILLECTOMY     TOTAL HIP ARTHROPLASTY Right    TOTAL HIP ARTHROPLASTY Left 06/12/2013   Procedure: LEFT TOTAL HIP ARTHROPLASTY ANTERIOR APPROACH;  Surgeon: Kathryne Hitch, MD;  Location: [facility];  Service: Orthopedics;  Laterality: Left;   TYMPANOPLASTY  1973   right   ULNAR NERVE TRANSPOSITION Right    VENTRAL HERNIA REPAIR  05/30/2011   Procedure: HERNIA REPAIR VENTRAL ADULT;  Surgeon: Currie Paris, MD;  Location: [facility];  Service: General;  Laterality: Right;  repair right spigelian hernia   Current Meds  Medication Sig   acetaminophen (TYLENOL) 500 MG tablet Take 1,000 mg by mouth 2 (two) times daily.   aspirin EC 81 MG EC tablet Take 1 tablet (81 mg total) by mouth daily.   budesonide (ENTOCORT EC) 3 MG 24 hr capsule Take 3 mg by mouth daily.   Calcium Carbonate-Vitamin D (CALTRATE 600+D PO) Take 1 tablet by mouth daily.   cetirizine (ZYRTEC) 10 MG  tablet Take 10 mg by mouth 2 (two) times daily.   cholecalciferol (VITAMIN D) 1000 UNITS tablet Take 1,000 Units by mouth daily.   cycloSPORINE (RESTASIS) 0.05 % ophthalmic emulsion Place 1 drop into both eyes 2 (two) times daily.   diclofenac Sodium (VOLTAREN) 1 % GEL Apply 1 application  topically daily as needed (pain).   DULoxetine (CYMBALTA) 30 MG capsule Take 1 capsule (30 mg total) by mouth daily.   estradiol (ESTRING) 2 MG vaginal ring Place 2  mg vaginally every 3 (three) months. follow package directions   fludrocortisone (FLORINEF) 0.1 MG tablet Take 1 tablet (0.1 mg total) by mouth 2 (two) times daily.   gabapentin (NEURONTIN) 300 MG capsule Take 2 capsules (600 mg total) by mouth 3 (three) times daily. (Patient taking differently: Take 300 mg by mouth at bedtime.)   levothyroxine (SYNTHROID) 50 MCG tablet Take 50 mcg by mouth every other day. Pt takes on Tuesday,Thursday, and Sunday   levothyroxine (SYNTHROID) 75 MCG tablet Take 75 mcg by mouth 4 (four) times a week. Pt takes on Monday,Wednesday,Friday, and Saturday   Magnesium 250 MG TABS Take 250 mg by mouth daily.   meclizine (ANTIVERT) 25 MG tablet Take 25 mg by mouth 3 (three) times daily as needed for dizziness.    MELATONIN PO Take 4 mg by mouth at bedtime.   ondansetron (ZOFRAN) 4 MG tablet Take 4 mg by mouth every 8 (eight) hours as needed for nausea.    potassium chloride (KLOR-CON M) 10 MEQ tablet Take 2 tablets (20 mEq total) by mouth daily.   Probiotic Product (ALIGN) 4 MG CAPS Take 4 mg by mouth at bedtime.    rosuvastatin (CRESTOR) 10 MG tablet Take 1 tablet (10 mg total) by mouth daily. (Patient taking differently: Take 10 mg by mouth at bedtime.)   vitamin B-12 (CYANOCOBALAMIN) 500 MCG tablet Take 500 mcg by mouth daily.     Allergies  Allergen Reactions   Anesthetics, Amide Other (See Comments)    Patient unsure of names, however multiples cause swelling of airway & nausea   Clindamycin Swelling   Shellfish  Allergy Other (See Comments)    Neurotoxic reaction   Sulfonamide Derivatives Anaphylaxis and Swelling   Neomycin Swelling   Zolpidem Tartrate Other (See Comments)    Jerking motions    Azithromycin Other (See Comments)    Severe gastritis    Bactroban Other (See Comments)    Causes sores in nose   Ciprofloxacin     joint swelling   Codeine Swelling   Meperidine Hcl Nausea Only    Hallucinations    Morphine Nausea Only    Hallucinations    Neosporin [Neomycin-Polymyxin-Gramicidin] Hives and Dermatitis    All topical "orin's ointment"   Nitrofurantoin     neuropathy in legs   Other     ALLERGIC TO WARM WATER SHELLFISH     Penicillins Other (See Comments)    Swelling in joints   Percocet [Oxycodone-Acetaminophen]     Dizziness    Tramadol     Makes jerk   Latex Rash    Review of Systems negative except from HPI and PMH  Physical Exam BP 90/64 (BP Location: Left Arm, Patient Position: Sitting, Cuff Size: Normal)   Pulse 75   Ht 5\' 7"  (1.702 m)   Wt 160 lb 9.6 oz (72.8 kg)   SpO2 97%   BMI 25.15 kg/m  Well developed and well nourished in no acute distress HENT normal Neck supple with JVP-flat Clear Device pocket well healed; without hematoma or erythema.  There is no tethering  Regular rate and rhythm, no  gallop No / murmur Abd-soft with active BS No Clubbing cyanosis  edema Skin-warm and dry A & Oriented  Grossly normal sensory and motor function  ECG sinus P-synchronous/ AV  pacing      Assessment and  Plan  Complete heart block  Pacemaker-Medtronic   orthostatic hypotension without heart rate response  Exercise associated hypotension  ? Dysautonomia  Central polyneuropathy  Dyspnea on exertion-worsening  Hiatal hernia  Grief  Device is at ERI.  She will undergo device generator replacement a couple of weeks.  Profound orthostatic hypotension has ensued, notably associated with very little heart rate response.  2 questions are back by  the timing, the first is is there any association between stress reactions i.e. Tako-Tsubo and autonomic issues and secondly are there any associations between the central polyneuropathy and progressive autonomic insufficiency the latter suggested by the lack of heart rate response.  A brief literature search does not reveal acute "Tako-Tsubo like "triggering of autonomic insufficiency.  There are data that patients with Tako-Tsubo (we will see what her echo shows following the emotional catastrophe associate with the abrupt loss of her daughter's life) have underlying propensity to sympathetic over stimulation and decreased parasympathetic modulation.  She is using compression.  Her fludrocortisone was recently increased from 0.1--0.2 milligrams daily.  We may have to increase it further.  In the interim we will resume her midodrine 5 mg 3 times daily.  I will reach out to neurology   She has worsening dyspnea on exertion.  Echocardiogram recently showed normal LV function albeit with an elevated E/E' suggestive of HFpEF.  Right now, given her volume issues and her blood pressure it is going to be hard to diurese her.

## 2023-02-12 ENCOUNTER — Ambulatory Visit (HOSPITAL_COMMUNITY): Payer: Medicare Other | Attending: Internal Medicine

## 2023-02-12 DIAGNOSIS — R0609 Other forms of dyspnea: Secondary | ICD-10-CM | POA: Diagnosis not present

## 2023-02-12 LAB — ECHOCARDIOGRAM COMPLETE
Area-P 1/2: 2.61 cm2
S' Lateral: 2.6 cm

## 2023-02-13 ENCOUNTER — Encounter: Payer: Self-pay | Admitting: Internal Medicine

## 2023-02-13 DIAGNOSIS — I951 Orthostatic hypotension: Secondary | ICD-10-CM

## 2023-02-13 DIAGNOSIS — R42 Dizziness and giddiness: Secondary | ICD-10-CM

## 2023-02-13 DIAGNOSIS — R55 Syncope and collapse: Secondary | ICD-10-CM

## 2023-02-14 MED ORDER — FLUDROCORTISONE ACETATE 0.1 MG PO TABS: 0.3000 mg | ORAL_TABLET | Freq: Every day | ORAL | Status: DC

## 2023-02-14 MED ORDER — FLUDROCORTISONE ACETATE 0.1 MG PO TABS: 0.3000 mg | ORAL_TABLET | Freq: Two times a day (BID) | ORAL | Status: DC

## 2023-02-14 MED ORDER — MIDODRINE HCL 10 MG PO TABS: 10.0000 mg | ORAL_TABLET | Freq: Three times a day (TID) | ORAL | Status: DC

## 2023-02-14 NOTE — Telephone Encounter (Signed)
Lets do 3 things 1) blood work -- needs to within 3 hrs of awakening to include--cortisol, ACTH, renin and aldo levels 2) increase her florinef to 0.3 3) increase her midodrine to 10 tid And make sure she is taking plenty of fluids

## 2023-02-18 NOTE — Addendum Note (Signed)
Addended by: Alois Cliche on: 02/18/2023 06:08 PM   Modules accepted: Orders

## 2023-02-19 DIAGNOSIS — I951 Orthostatic hypotension: Secondary | ICD-10-CM | POA: Diagnosis not present

## 2023-02-19 DIAGNOSIS — R55 Syncope and collapse: Secondary | ICD-10-CM | POA: Diagnosis not present

## 2023-02-19 DIAGNOSIS — R42 Dizziness and giddiness: Secondary | ICD-10-CM | POA: Diagnosis not present

## 2023-02-19 LAB — CORTISOL: Cortisol: 12 ug/dL (ref 6.2–19.4)

## 2023-02-22 LAB — ALDOSTERONE: Aldosterone: 2 ng/dL (ref 0.0–30.0)

## 2023-02-22 LAB — ACTH: ACTH: 18 pg/mL (ref 7.2–63.3)

## 2023-02-25 ENCOUNTER — Ambulatory Visit (INDEPENDENT_AMBULATORY_CARE_PROVIDER_SITE_OTHER): Payer: Medicare Other

## 2023-02-25 DIAGNOSIS — I442 Atrioventricular block, complete: Secondary | ICD-10-CM

## 2023-02-25 LAB — RENIN: Renin Activity, Plasma: 0.295 ng/mL/h (ref 0.167–5.380)

## 2023-02-26 MED ORDER — FLUDROCORTISONE ACETATE 0.1 MG PO TABS: 0.3000 mg | ORAL_TABLET | Freq: Every day | ORAL | 3 refills | Status: DC

## 2023-02-26 MED ORDER — MIDODRINE HCL 10 MG PO TABS: 10.0000 mg | ORAL_TABLET | Freq: Three times a day (TID) | ORAL | 3 refills | Status: DC

## 2023-02-26 NOTE — Addendum Note (Signed)
Addended by: Alois Cliche on: 02/26/2023 05:18 PM   Modules accepted: Orders

## 2023-02-26 NOTE — Pre-Procedure Instructions (Signed)
Instructed patient on the following items: Arrival time 0615 Nothing to eat or drink after midnight No meds AM of procedure Responsible person to drive you home and stay with you for 24 hrs Wash with special soap night before and morning of procedure If on anti-coagulant drug instructions ASA- last dose was 10/9

## 2023-02-27 ENCOUNTER — Ambulatory Visit (HOSPITAL_COMMUNITY)
Admission: RE | Admit: 2023-02-27 | Discharge: 2023-02-27 | Disposition: A | Discharge status: Home / Self Care | Payer: Medicare Other | Attending: Internal Medicine | Admitting: Internal Medicine

## 2023-02-27 ENCOUNTER — Other Ambulatory Visit: Payer: Self-pay

## 2023-02-27 ENCOUNTER — Encounter (HOSPITAL_COMMUNITY)
Admission: RE | Disposition: A | Discharge status: Home / Self Care | Payer: Self-pay | Source: Home / Self Care | Attending: Internal Medicine

## 2023-02-27 DIAGNOSIS — I951 Orthostatic hypotension: Secondary | ICD-10-CM | POA: Insufficient documentation

## 2023-02-27 DIAGNOSIS — Z79899 Other long term (current) drug therapy: Secondary | ICD-10-CM | POA: Diagnosis not present

## 2023-02-27 DIAGNOSIS — R682 Dry mouth, unspecified: Secondary | ICD-10-CM | POA: Diagnosis not present

## 2023-02-27 DIAGNOSIS — I442 Atrioventricular block, complete: Secondary | ICD-10-CM | POA: Insufficient documentation

## 2023-02-27 DIAGNOSIS — F4321 Adjustment disorder with depressed mood: Secondary | ICD-10-CM | POA: Diagnosis not present

## 2023-02-27 DIAGNOSIS — Z45010 Encounter for checking and testing of cardiac pacemaker pulse generator [battery]: Secondary | ICD-10-CM | POA: Insufficient documentation

## 2023-02-27 DIAGNOSIS — K449 Diaphragmatic hernia without obstruction or gangrene: Secondary | ICD-10-CM | POA: Insufficient documentation

## 2023-02-27 DIAGNOSIS — G629 Polyneuropathy, unspecified: Secondary | ICD-10-CM | POA: Insufficient documentation

## 2023-02-27 DIAGNOSIS — K52831 Collagenous colitis: Secondary | ICD-10-CM | POA: Diagnosis not present

## 2023-02-27 DIAGNOSIS — R0609 Other forms of dyspnea: Secondary | ICD-10-CM | POA: Insufficient documentation

## 2023-02-27 DIAGNOSIS — I441 Atrioventricular block, second degree: Secondary | ICD-10-CM

## 2023-02-27 HISTORY — PX: PPM GENERATOR CHANGEOUT: EP1233

## 2023-02-27 LAB — CUP PACEART REMOTE DEVICE CHECK
Battery Remaining Longevity: 2 mo
Battery Voltage: 2.81 V
Brady Statistic AP VP Percent: 25.46 %
Brady Statistic AP VS Percent: 0 %
Brady Statistic AS VP Percent: 74.52 %
Brady Statistic AS VS Percent: 0.02 %
Brady Statistic RA Percent Paced: 25.46 %
Brady Statistic RV Percent Paced: 99.98 %
Date Time Interrogation Session: 20241014101056
Implantable Lead Connection Status: 753985
Implantable Lead Connection Status: 753985
Implantable Lead Implant Date: 20140818
Implantable Lead Implant Date: 20140818
Implantable Lead Location: 753859
Implantable Lead Location: 753860
Implantable Lead Model: 5076
Implantable Lead Model: 5076
Implantable Pulse Generator Implant Date: 20140818
Lead Channel Impedance Value: 342 Ohm
Lead Channel Impedance Value: 437 Ohm
Lead Channel Impedance Value: 532 Ohm
Lead Channel Impedance Value: 570 Ohm
Lead Channel Pacing Threshold Amplitude: 0.875 V
Lead Channel Pacing Threshold Amplitude: 1.125 V
Lead Channel Pacing Threshold Pulse Width: 0.4 ms
Lead Channel Pacing Threshold Pulse Width: 0.4 ms
Lead Channel Sensing Intrinsic Amplitude: 19.875 mV
Lead Channel Sensing Intrinsic Amplitude: 19.875 mV
Lead Channel Sensing Intrinsic Amplitude: 2.75 mV
Lead Channel Sensing Intrinsic Amplitude: 2.75 mV
Lead Channel Setting Pacing Amplitude: 2.5 V
Lead Channel Setting Pacing Amplitude: 2.5 V
Lead Channel Setting Pacing Pulse Width: 0.4 ms
Lead Channel Setting Sensing Sensitivity: 0.9 mV
Zone Setting Status: 755011

## 2023-02-27 LAB — CBC
HCT: 41.3 % (ref 36.0–46.0)
Hemoglobin: 13.6 g/dL (ref 12.0–15.0)
MCH: 31.1 pg (ref 26.0–34.0)
MCHC: 32.9 g/dL (ref 30.0–36.0)
MCV: 94.5 fL (ref 80.0–100.0)
Platelets: 241 10*3/uL (ref 150–400)
RBC: 4.37 MIL/uL (ref 3.87–5.11)
RDW: 13.2 % (ref 11.5–15.5)
WBC: 9.2 10*3/uL (ref 4.0–10.5)
nRBC: 0 % (ref 0.0–0.2)

## 2023-02-27 SURGERY — PPM GENERATOR CHANGEOUT

## 2023-02-27 MED ORDER — SODIUM CHLORIDE 0.9% FLUSH: 3.0000 mL | Freq: Two times a day (BID) | INTRAVENOUS | Status: DC

## 2023-02-27 MED ORDER — HYDRALAZINE HCL 20 MG/ML IJ SOLN
INTRAMUSCULAR | Status: AC
  Filled 2023-02-27: qty 1

## 2023-02-27 MED ORDER — HYDRALAZINE HCL 20 MG/ML IJ SOLN
INTRAMUSCULAR | Status: DC | PRN
  Administered 2023-02-27: 10 mg via INTRAVENOUS

## 2023-02-27 MED ORDER — CEFAZOLIN SODIUM-DEXTROSE 2-4 GM/100ML-% IV SOLN
INTRAVENOUS | Status: AC
  Filled 2023-02-27: qty 100

## 2023-02-27 MED ORDER — SODIUM CHLORIDE 0.9 % IV SOLN: INTRAVENOUS | Status: DC

## 2023-02-27 MED ORDER — FLUDROCORTISONE ACETATE 0.1 MG PO TABS: 0.1000 mg | ORAL_TABLET | Freq: Every day | ORAL | 3 refills | Status: AC

## 2023-02-27 MED ORDER — CHLORHEXIDINE GLUCONATE 4 % EX SOLN
4.0000 | Freq: Once | CUTANEOUS | Status: DC
  Filled 2023-02-27: qty 60

## 2023-02-27 MED ORDER — PYRIDOSTIGMINE BROMIDE 60 MG PO TABS: 30.0000 mg | ORAL_TABLET | Freq: Two times a day (BID) | ORAL | 30 refills | Status: DC

## 2023-02-27 MED ORDER — LIDOCAINE HCL (PF) 1 % IJ SOLN
INTRAMUSCULAR | Status: DC | PRN
  Administered 2023-02-27: 60 mL

## 2023-02-27 MED ORDER — LIDOCAINE HCL 1 % IJ SOLN
INTRAMUSCULAR | Status: AC
  Filled 2023-02-27: qty 60

## 2023-02-27 MED ORDER — VANCOMYCIN HCL IN DEXTROSE 1-5 GM/200ML-% IV SOLN
INTRAVENOUS | Status: AC
  Filled 2023-02-27: qty 200

## 2023-02-27 MED ORDER — FENTANYL CITRATE (PF) 100 MCG/2ML IJ SOLN
INTRAMUSCULAR | Status: AC
  Filled 2023-02-27: qty 2

## 2023-02-27 MED ORDER — MIDAZOLAM HCL 5 MG/5ML IJ SOLN
INTRAMUSCULAR | Status: AC
  Filled 2023-02-27: qty 5

## 2023-02-27 MED ORDER — CEFAZOLIN SODIUM-DEXTROSE 2-4 GM/100ML-% IV SOLN: 2.0000 g | INTRAVENOUS | Status: DC

## 2023-02-27 MED ORDER — SODIUM CHLORIDE 0.9 % IV SOLN: 80.0000 mg | INTRAVENOUS | Status: AC

## 2023-02-27 MED ORDER — SODIUM CHLORIDE 0.9 % IV SOLN
INTRAVENOUS | Status: AC
  Administered 2023-02-27: 80 mg
  Filled 2023-02-27: qty 2

## 2023-02-27 MED ORDER — VANCOMYCIN HCL IN DEXTROSE 1-5 GM/200ML-% IV SOLN
1000.0000 mg | INTRAVENOUS | Status: AC
  Administered 2023-02-27: 1000 mg via INTRAVENOUS

## 2023-02-27 MED ORDER — ACETAMINOPHEN 325 MG PO TABS: 325.0000 mg | ORAL_TABLET | ORAL | Status: DC | PRN

## 2023-02-27 MED ORDER — MIDODRINE HCL 5 MG PO TABS
10.0000 mg | ORAL_TABLET | Freq: Once | ORAL | Status: AC
  Administered 2023-02-27: 10 mg via ORAL
  Filled 2023-02-27: qty 2

## 2023-02-27 SURGICAL SUPPLY — 8 items
CABLE SURGICAL S-101-97-12 (CABLE) ×1 IMPLANT
DEVICE DISSECT PLASMABLAD 3.0S (MISCELLANEOUS) IMPLANT
IPG PACE AZUR XT DR MRI W1DR01 (Pacemaker) IMPLANT
KIT WRENCH (KITS) IMPLANT
PACE AZURE XT DR MRI W1DR01 (Pacemaker) ×1 IMPLANT
PAD DEFIB RADIO PHYSIO CONN (PAD) ×1 IMPLANT
PLASMABLADE 3.0S (MISCELLANEOUS) ×1
TRAY PACEMAKER INSERTION (PACKS) ×1 IMPLANT

## 2023-02-27 NOTE — Interval H&P Note (Signed)
History and Physical Interval Note:  02/27/2023 7:46 AM  [name]  has presented today for surgery, with the diagnosis of eri.  The various methods of treatment have been discussed with the patient and family. After consideration of risks, benefits and other options for treatment, the patient has consented to  Procedure(s): PPM GENERATOR CHANGEOUT (N/A) as a surgical intervention.  The patient's history has been reviewed, patient examined, no change in status, stable for surgery.  I have reviewed the patient's chart and labs.  Questions were answered to the patient's satisfaction.     Sherryl Manges  Contyineues with orthostatic intolerance and falls Dry eyes and mouth and lack of HR response suggests autonomic failure Encouraged exercise recumbent-- HOB elevation

## 2023-02-27 NOTE — Discharge Instructions (Signed)

## 2023-02-28 ENCOUNTER — Encounter (HOSPITAL_COMMUNITY): Payer: Self-pay | Admitting: Internal Medicine

## 2023-03-01 ENCOUNTER — Encounter: Payer: Self-pay | Admitting: Internal Medicine

## 2023-03-06 DIAGNOSIS — E039 Hypothyroidism, unspecified: Secondary | ICD-10-CM | POA: Diagnosis not present

## 2023-03-06 DIAGNOSIS — H8190 Unspecified disorder of vestibular function, unspecified ear: Secondary | ICD-10-CM | POA: Diagnosis not present

## 2023-03-06 DIAGNOSIS — F4321 Adjustment disorder with depressed mood: Secondary | ICD-10-CM | POA: Diagnosis not present

## 2023-03-06 DIAGNOSIS — Z23 Encounter for immunization: Secondary | ICD-10-CM | POA: Diagnosis not present

## 2023-03-06 DIAGNOSIS — Z950 Presence of cardiac pacemaker: Secondary | ICD-10-CM | POA: Diagnosis not present

## 2023-03-06 DIAGNOSIS — E785 Hyperlipidemia, unspecified: Secondary | ICD-10-CM | POA: Diagnosis not present

## 2023-03-06 DIAGNOSIS — M797 Fibromyalgia: Secondary | ICD-10-CM | POA: Diagnosis not present

## 2023-03-06 DIAGNOSIS — G909 Disorder of the autonomic nervous system, unspecified: Secondary | ICD-10-CM | POA: Diagnosis not present

## 2023-03-06 DIAGNOSIS — I480 Paroxysmal atrial fibrillation: Secondary | ICD-10-CM | POA: Diagnosis not present

## 2023-03-06 DIAGNOSIS — R269 Unspecified abnormalities of gait and mobility: Secondary | ICD-10-CM | POA: Diagnosis not present

## 2023-03-07 ENCOUNTER — Encounter: Payer: Self-pay | Admitting: Obstetrics

## 2023-03-07 DIAGNOSIS — Z7729 Contact with and (suspected ) exposure to other hazardous substances: Secondary | ICD-10-CM | POA: Diagnosis not present

## 2023-03-07 DIAGNOSIS — Z779 Other contact with and (suspected) exposures hazardous to health: Secondary | ICD-10-CM | POA: Diagnosis not present

## 2023-03-07 DIAGNOSIS — Z01419 Encounter for gynecological examination (general) (routine) without abnormal findings: Secondary | ICD-10-CM | POA: Diagnosis not present

## 2023-03-08 ENCOUNTER — Other Ambulatory Visit: Payer: Self-pay | Admitting: Obstetrics

## 2023-03-08 DIAGNOSIS — Z9189 Other specified personal risk factors, not elsewhere classified: Secondary | ICD-10-CM

## 2023-03-13 ENCOUNTER — Ambulatory Visit: Payer: Medicare Other | Attending: Cardiology

## 2023-03-13 DIAGNOSIS — I442 Atrioventricular block, complete: Secondary | ICD-10-CM | POA: Diagnosis not present

## 2023-03-13 LAB — CUP PACEART INCLINIC DEVICE CHECK
Battery Remaining Longevity: 139 mo
Battery Voltage: 3.22 V
Brady Statistic AP VP Percent: 33.7 %
Brady Statistic AP VS Percent: 0 %
Brady Statistic AS VP Percent: 66.3 %
Brady Statistic AS VS Percent: 0 %
Brady Statistic RA Percent Paced: 33.67 %
Brady Statistic RV Percent Paced: 100 %
Date Time Interrogation Session: 20241030143356
Implantable Lead Connection Status: 753985
Implantable Lead Connection Status: 753985
Implantable Lead Implant Date: 20140818
Implantable Lead Implant Date: 20140818
Implantable Lead Location: 753859
Implantable Lead Location: 753860
Implantable Lead Model: 5076
Implantable Lead Model: 5076
Implantable Pulse Generator Implant Date: 20241016
Lead Channel Impedance Value: 285 Ohm
Lead Channel Impedance Value: 456 Ohm
Lead Channel Impedance Value: 532 Ohm
Lead Channel Impedance Value: 589 Ohm
Lead Channel Pacing Threshold Amplitude: 0.75 V
Lead Channel Pacing Threshold Amplitude: 1.125 V
Lead Channel Pacing Threshold Pulse Width: 0.4 ms
Lead Channel Pacing Threshold Pulse Width: 0.4 ms
Lead Channel Sensing Intrinsic Amplitude: 0 mV
Lead Channel Sensing Intrinsic Amplitude: 1.375 mV
Lead Channel Sensing Intrinsic Amplitude: 1.5 mV
Lead Channel Setting Pacing Amplitude: 2.25 V
Lead Channel Setting Pacing Amplitude: 2.5 V
Lead Channel Setting Pacing Pulse Width: 0.4 ms
Lead Channel Setting Sensing Sensitivity: 1.2 mV
Zone Setting Status: 755011
Zone Setting Status: 755011

## 2023-03-13 NOTE — Addendum Note (Signed)
Addended by: Geralyn Flash D on: 03/13/2023 01:15 PM   Modules accepted: Level of Service

## 2023-03-13 NOTE — Progress Notes (Signed)
Remote pacemaker transmission.   

## 2023-03-13 NOTE — Patient Instructions (Signed)
   After Your Pacemaker   Monitor your pacemaker site for redness, swelling, and drainage. Call the device clinic at [phone] if you experience these symptoms or fever/chills.  Your incision was closed with Dermabond:  You may shower 1 day after your defibrillator implant and wash your incision with soap and water. Avoid lotions, ointments, or perfumes over your incision until it is well-healed.  You may use a hot tub or a pool after your wound check appointment if the incision is completely closed.  There are no restrictions in arm movement after your wound check appointment.  You may drive, unless driving has been restricted by your healthcare providers.  Remote monitoring is used to monitor your pacemaker from home. This monitoring is scheduled every 91 days by our office. It allows [place] to keep an eye on the functioning of your device to ensure it is working properly. You will routinely see your Electrophysiologist annually (more often if necessary).

## 2023-03-13 NOTE — Progress Notes (Signed)
Wound check appointment. Dermabond removed. Wound without redness or edema. Incision edges approximated, wound well healed. Normal device function. Thresholds, sensing, and impedances consistent with implant measurements. Device programmed at chronic outputs post gen change. Histogram distribution appropriate for patient and level of activity. No mode switches or high ventricular rates noted. Patient educated on no arm mobility or lifting restrictions. ROV in 3 months with implanting physician.

## 2023-03-15 NOTE — Telephone Encounter (Signed)
We will use hydralazine 25 mg at bedtime to help with the high systolic blood pressures when she is flat.  I cannot remember whether she was on a higher dose of Prostigmin at 60 mg twice daily, I think that this was decreased actually to the 30 because of intolerance.  Her 10 mg of ProAmatine if she can tolerate it can be increased to 15 mg 3 times a day while she is awake and sitting up.  Compression is also going to be important.

## 2023-03-19 ENCOUNTER — Other Ambulatory Visit: Payer: Self-pay

## 2023-03-19 ENCOUNTER — Telehealth: Payer: Self-pay | Admitting: Internal Medicine

## 2023-03-19 MED ORDER — PYRIDOSTIGMINE BROMIDE 60 MG PO TABS: 30.0000 mg | ORAL_TABLET | Freq: Two times a day (BID) | ORAL | 3 refills | Status: DC

## 2023-03-19 MED ORDER — MIDODRINE HCL 10 MG PO TABS: 15.0000 mg | ORAL_TABLET | Freq: Three times a day (TID) | ORAL | 0 refills | Status: DC

## 2023-03-19 MED ORDER — HYDRALAZINE HCL 25 MG PO TABS: 25.0000 mg | ORAL_TABLET | Freq: Every day | ORAL | 0 refills | Status: DC

## 2023-03-19 MED ORDER — MIDODRINE HCL 10 MG PO TABS: 10.0000 mg | ORAL_TABLET | Freq: Three times a day (TID) | ORAL | 3 refills | Status: DC

## 2023-03-19 NOTE — Addendum Note (Signed)
Addended by: Alois Cliche on: 03/19/2023 10:24 AM   Modules accepted: Orders

## 2023-03-19 NOTE — Telephone Encounter (Signed)
  Patient states that Mindi Junker texted her this morning and she is calling to speak with her because she could not respond via text.

## 2023-03-19 NOTE — Telephone Encounter (Signed)
[facility] mail order pharmacy is requesting a refill on pyridostigmine. Dr. Graciela Husbands prescribed this medication in the hospital. Would Dr. Graciela Husbands like to refill this medication? Please address

## 2023-03-19 NOTE — Telephone Encounter (Signed)
Spoke with pt and advised Mestinon has been sent to [facility] as requested.  Pt thanked Charity fundraiser for the call.

## 2023-03-19 NOTE — Telephone Encounter (Signed)
Spoke with pt regarding medication recommendations per Dr Graciela Husbands.  See MyChart encounter message for complete details.  Pt states she does not understand why she is having such issues with her blood pressure  unless it is related to the death of her daughter.  She states she began having these issues about 4-6 weeks after her daughter's death.  She also has difficulty eating and has lost a great deal of weight.  She has asked her PCP for a referral re: counseling and he was unaware of anyone.  He did prescribe Xanax to help with pt;s sleep.  Pt provided [facility] name and phone number and suggested she reach out to them for counseling.  Provided [facility] information as well and encouraged pt she contact for support as she tries to deal with the death of her daughter.  Pt verbalized understanding and thanked Charity fundraiser for the call.

## 2023-03-20 DIAGNOSIS — Z634 Disappearance and death of family member: Secondary | ICD-10-CM | POA: Diagnosis not present

## 2023-03-20 DIAGNOSIS — K52831 Collagenous colitis: Secondary | ICD-10-CM | POA: Diagnosis not present

## 2023-03-20 DIAGNOSIS — F4321 Adjustment disorder with depressed mood: Secondary | ICD-10-CM | POA: Diagnosis not present

## 2023-03-20 DIAGNOSIS — K219 Gastro-esophageal reflux disease without esophagitis: Secondary | ICD-10-CM | POA: Diagnosis not present

## 2023-03-20 DIAGNOSIS — Z800 Family history of malignant neoplasm of digestive organs: Secondary | ICD-10-CM | POA: Diagnosis not present

## 2023-03-25 ENCOUNTER — Ambulatory Visit: Payer: Medicare Other

## 2023-03-26 ENCOUNTER — Other Ambulatory Visit: Payer: Self-pay

## 2023-03-26 MED ORDER — MIDODRINE HCL 10 MG PO TABS: 15.0000 mg | ORAL_TABLET | Freq: Three times a day (TID) | ORAL | 2 refills | Status: DC

## 2023-03-26 MED ORDER — HYDRALAZINE HCL 25 MG PO TABS: 25.0000 mg | ORAL_TABLET | Freq: Every day | ORAL | 3 refills | Status: DC

## 2023-03-29 NOTE — Telephone Encounter (Signed)
Spoke with [facility] Rep who states Midodrine 10mg  #405 tablets will be shipped on 04/01/2023.  Pt made aware and verbalizes understanding.

## 2023-04-01 ENCOUNTER — Other Ambulatory Visit (HOSPITAL_COMMUNITY): Payer: Self-pay | Admitting: Internal Medicine

## 2023-04-01 ENCOUNTER — Other Ambulatory Visit (HOSPITAL_COMMUNITY): Payer: Self-pay | Admitting: Family Medicine

## 2023-04-01 ENCOUNTER — Encounter (HOSPITAL_COMMUNITY): Payer: Self-pay | Admitting: Family Medicine

## 2023-04-01 ENCOUNTER — Ambulatory Visit (HOSPITAL_COMMUNITY)
Admission: RE | Admit: 2023-04-01 | Discharge: 2023-04-01 | Disposition: A | Discharge status: Home / Self Care | Payer: Medicare Other | Source: Ambulatory Visit | Attending: Internal Medicine | Admitting: Internal Medicine

## 2023-04-01 DIAGNOSIS — I480 Paroxysmal atrial fibrillation: Secondary | ICD-10-CM | POA: Diagnosis not present

## 2023-04-01 DIAGNOSIS — E785 Hyperlipidemia, unspecified: Secondary | ICD-10-CM | POA: Diagnosis not present

## 2023-04-01 DIAGNOSIS — H8190 Unspecified disorder of vestibular function, unspecified ear: Secondary | ICD-10-CM | POA: Diagnosis not present

## 2023-04-01 DIAGNOSIS — G901 Familial dysautonomia [Riley-Day]: Secondary | ICD-10-CM | POA: Diagnosis not present

## 2023-04-01 DIAGNOSIS — G9389 Other specified disorders of brain: Secondary | ICD-10-CM | POA: Diagnosis not present

## 2023-04-01 DIAGNOSIS — R55 Syncope and collapse: Secondary | ICD-10-CM

## 2023-04-01 DIAGNOSIS — K52831 Collagenous colitis: Secondary | ICD-10-CM | POA: Diagnosis not present

## 2023-04-01 DIAGNOSIS — F329 Major depressive disorder, single episode, unspecified: Secondary | ICD-10-CM | POA: Diagnosis not present

## 2023-04-01 DIAGNOSIS — M545 Low back pain, unspecified: Secondary | ICD-10-CM | POA: Diagnosis not present

## 2023-04-01 DIAGNOSIS — R269 Unspecified abnormalities of gait and mobility: Secondary | ICD-10-CM | POA: Diagnosis not present

## 2023-04-01 DIAGNOSIS — G909 Disorder of the autonomic nervous system, unspecified: Secondary | ICD-10-CM | POA: Diagnosis not present

## 2023-04-01 DIAGNOSIS — F4321 Adjustment disorder with depressed mood: Secondary | ICD-10-CM | POA: Diagnosis not present

## 2023-04-01 DIAGNOSIS — M797 Fibromyalgia: Secondary | ICD-10-CM | POA: Diagnosis not present

## 2023-04-03 DIAGNOSIS — G909 Disorder of the autonomic nervous system, unspecified: Secondary | ICD-10-CM | POA: Diagnosis not present

## 2023-04-03 DIAGNOSIS — I480 Paroxysmal atrial fibrillation: Secondary | ICD-10-CM | POA: Diagnosis not present

## 2023-04-03 DIAGNOSIS — F4321 Adjustment disorder with depressed mood: Secondary | ICD-10-CM | POA: Diagnosis not present

## 2023-04-03 DIAGNOSIS — R269 Unspecified abnormalities of gait and mobility: Secondary | ICD-10-CM | POA: Diagnosis not present

## 2023-04-03 DIAGNOSIS — F329 Major depressive disorder, single episode, unspecified: Secondary | ICD-10-CM | POA: Diagnosis not present

## 2023-04-05 DIAGNOSIS — K52831 Collagenous colitis: Secondary | ICD-10-CM | POA: Diagnosis not present

## 2023-04-09 ENCOUNTER — Observation Stay (HOSPITAL_COMMUNITY)
Admission: EM | Admit: 2023-04-09 | Discharge: 2023-04-10 | Disposition: A | Discharge status: Home / Self Care | Payer: Medicare Other | Attending: Internal Medicine | Admitting: Internal Medicine

## 2023-04-09 ENCOUNTER — Other Ambulatory Visit: Payer: Self-pay

## 2023-04-09 ENCOUNTER — Encounter (HOSPITAL_COMMUNITY): Payer: Self-pay | Admitting: Family Medicine

## 2023-04-09 ENCOUNTER — Emergency Department (HOSPITAL_COMMUNITY): Payer: Medicare Other

## 2023-04-09 DIAGNOSIS — Z853 Personal history of malignant neoplasm of breast: Secondary | ICD-10-CM | POA: Insufficient documentation

## 2023-04-09 DIAGNOSIS — R519 Headache, unspecified: Secondary | ICD-10-CM | POA: Diagnosis not present

## 2023-04-09 DIAGNOSIS — G43809 Other migraine, not intractable, without status migrainosus: Secondary | ICD-10-CM | POA: Insufficient documentation

## 2023-04-09 DIAGNOSIS — Z79899 Other long term (current) drug therapy: Secondary | ICD-10-CM | POA: Diagnosis not present

## 2023-04-09 DIAGNOSIS — J45909 Unspecified asthma, uncomplicated: Secondary | ICD-10-CM | POA: Insufficient documentation

## 2023-04-09 DIAGNOSIS — Z8673 Personal history of transient ischemic attack (TIA), and cerebral infarction without residual deficits: Secondary | ICD-10-CM | POA: Insufficient documentation

## 2023-04-09 DIAGNOSIS — I700 Atherosclerosis of aorta: Secondary | ICD-10-CM | POA: Insufficient documentation

## 2023-04-09 DIAGNOSIS — I442 Atrioventricular block, complete: Secondary | ICD-10-CM | POA: Diagnosis not present

## 2023-04-09 DIAGNOSIS — E785 Hyperlipidemia, unspecified: Secondary | ICD-10-CM | POA: Insufficient documentation

## 2023-04-09 DIAGNOSIS — I6782 Cerebral ischemia: Secondary | ICD-10-CM | POA: Diagnosis not present

## 2023-04-09 DIAGNOSIS — G459 Transient cerebral ischemic attack, unspecified: Secondary | ICD-10-CM | POA: Diagnosis not present

## 2023-04-09 DIAGNOSIS — Z96643 Presence of artificial hip joint, bilateral: Secondary | ICD-10-CM | POA: Diagnosis not present

## 2023-04-09 DIAGNOSIS — R531 Weakness: Secondary | ICD-10-CM | POA: Insufficient documentation

## 2023-04-09 DIAGNOSIS — E876 Hypokalemia: Secondary | ICD-10-CM | POA: Diagnosis not present

## 2023-04-09 DIAGNOSIS — R29818 Other symptoms and signs involving the nervous system: Secondary | ICD-10-CM | POA: Diagnosis not present

## 2023-04-09 DIAGNOSIS — E039 Hypothyroidism, unspecified: Secondary | ICD-10-CM | POA: Insufficient documentation

## 2023-04-09 DIAGNOSIS — Z7982 Long term (current) use of aspirin: Secondary | ICD-10-CM | POA: Diagnosis not present

## 2023-04-09 DIAGNOSIS — Z91040 Latex allergy status: Secondary | ICD-10-CM | POA: Insufficient documentation

## 2023-04-09 DIAGNOSIS — Z7902 Long term (current) use of antithrombotics/antiplatelets: Secondary | ICD-10-CM | POA: Insufficient documentation

## 2023-04-09 DIAGNOSIS — I951 Orthostatic hypotension: Secondary | ICD-10-CM | POA: Diagnosis not present

## 2023-04-09 DIAGNOSIS — Z950 Presence of cardiac pacemaker: Secondary | ICD-10-CM | POA: Diagnosis not present

## 2023-04-09 DIAGNOSIS — Z8249 Family history of ischemic heart disease and other diseases of the circulatory system: Secondary | ICD-10-CM | POA: Diagnosis not present

## 2023-04-09 DIAGNOSIS — F32A Depression, unspecified: Secondary | ICD-10-CM | POA: Diagnosis not present

## 2023-04-09 DIAGNOSIS — G4489 Other headache syndrome: Secondary | ICD-10-CM | POA: Diagnosis not present

## 2023-04-09 DIAGNOSIS — I10 Essential (primary) hypertension: Secondary | ICD-10-CM | POA: Insufficient documentation

## 2023-04-09 DIAGNOSIS — G43109 Migraine with aura, not intractable, without status migrainosus: Secondary | ICD-10-CM | POA: Diagnosis present

## 2023-04-09 DIAGNOSIS — R1310 Dysphagia, unspecified: Secondary | ICD-10-CM | POA: Diagnosis present

## 2023-04-09 DIAGNOSIS — Z981 Arthrodesis status: Secondary | ICD-10-CM | POA: Diagnosis not present

## 2023-04-09 LAB — URINALYSIS, ROUTINE W REFLEX MICROSCOPIC
Bilirubin Urine: NEGATIVE
Glucose, UA: 50 mg/dL — AB
Hgb urine dipstick: NEGATIVE
Ketones, ur: NEGATIVE mg/dL
Leukocytes,Ua: NEGATIVE
Nitrite: NEGATIVE
Protein, ur: NEGATIVE mg/dL
Specific Gravity, Urine: 1.03 (ref 1.005–1.030)
pH: 7 (ref 5.0–8.0)

## 2023-04-09 LAB — PROTIME-INR
INR: 1 (ref 0.8–1.2)
Prothrombin Time: 13.5 s (ref 11.4–15.2)

## 2023-04-09 LAB — I-STAT CHEM 8, ED
BUN: 19 mg/dL (ref 8–23)
Calcium, Ion: 1.12 mmol/L — ABNORMAL LOW (ref 1.15–1.40)
Chloride: 103 mmol/L (ref 98–111)
Creatinine, Ser: 0.8 mg/dL (ref 0.44–1.00)
Glucose, Bld: 102 mg/dL — ABNORMAL HIGH (ref 70–99)
HCT: 39 % (ref 36.0–46.0)
Hemoglobin: 13.3 g/dL (ref 12.0–15.0)
Potassium: 3.2 mmol/L — ABNORMAL LOW (ref 3.5–5.1)
Sodium: 140 mmol/L (ref 135–145)
TCO2: 24 mmol/L (ref 22–32)

## 2023-04-09 LAB — CBG MONITORING, ED: Glucose-Capillary: 98 mg/dL (ref 70–99)

## 2023-04-09 LAB — CBC
HCT: 40.2 % (ref 36.0–46.0)
Hemoglobin: 13.8 g/dL (ref 12.0–15.0)
MCH: 31.7 pg (ref 26.0–34.0)
MCHC: 34.3 g/dL (ref 30.0–36.0)
MCV: 92.2 fL (ref 80.0–100.0)
Platelets: 230 10*3/uL (ref 150–400)
RBC: 4.36 MIL/uL (ref 3.87–5.11)
RDW: 12.6 % (ref 11.5–15.5)
WBC: 8.8 10*3/uL (ref 4.0–10.5)
nRBC: 0 % (ref 0.0–0.2)

## 2023-04-09 LAB — RAPID URINE DRUG SCREEN, HOSP PERFORMED
Amphetamines: NOT DETECTED
Barbiturates: NOT DETECTED
Benzodiazepines: NOT DETECTED
Cocaine: NOT DETECTED
Opiates: NOT DETECTED
Tetrahydrocannabinol: NOT DETECTED

## 2023-04-09 LAB — COMPREHENSIVE METABOLIC PANEL
ALT: 18 U/L (ref 0–44)
AST: 27 U/L (ref 15–41)
Albumin: 4.1 g/dL (ref 3.5–5.0)
Alkaline Phosphatase: 43 U/L (ref 38–126)
Anion gap: 8 (ref 5–15)
BUN: 17 mg/dL (ref 8–23)
CO2: 24 mmol/L (ref 22–32)
Calcium: 9.3 mg/dL (ref 8.9–10.3)
Chloride: 101 mmol/L (ref 98–111)
Creatinine, Ser: 0.78 mg/dL (ref 0.44–1.00)
GFR, Estimated: >60 mL/min (ref >60)
Glucose, Bld: 107 mg/dL — ABNORMAL HIGH (ref 70–99)
Potassium: 3.2 mmol/L — ABNORMAL LOW (ref 3.5–5.1)
Sodium: 133 mmol/L — ABNORMAL LOW (ref 135–145)
Total Bilirubin: 0.8 mg/dL (ref <1.2)
Total Protein: 7.3 g/dL (ref 6.5–8.1)

## 2023-04-09 LAB — DIFFERENTIAL
Abs Immature Granulocytes: 0.02 10*3/uL (ref 0.00–0.07)
Basophils Absolute: 0 10*3/uL (ref 0.0–0.1)
Basophils Relative: 1 %
Eosinophils Absolute: 0.1 10*3/uL (ref 0.0–0.5)
Eosinophils Relative: 1 %
Immature Granulocytes: 0 %
Lymphocytes Relative: 37 %
Lymphs Abs: 3.2 10*3/uL (ref 0.7–4.0)
Monocytes Absolute: 0.8 10*3/uL (ref 0.1–1.0)
Monocytes Relative: 9 %
Neutro Abs: 4.7 10*3/uL (ref 1.7–7.7)
Neutrophils Relative %: 52 %

## 2023-04-09 LAB — APTT: aPTT: 26 s (ref 24–36)

## 2023-04-09 LAB — ETHANOL: Alcohol, Ethyl (B): <10 mg/dL (ref <10)

## 2023-04-09 MED ORDER — CLOPIDOGREL BISULFATE 300 MG PO TABS
300.0000 mg | ORAL_TABLET | Freq: Once | ORAL | Status: AC
  Administered 2023-04-09: 300 mg via ORAL
  Filled 2023-04-09: qty 1

## 2023-04-09 MED ORDER — ACETAMINOPHEN 500 MG PO TABS
1000.0000 mg | ORAL_TABLET | Freq: Once | ORAL | Status: AC
  Administered 2023-04-09: 1000 mg via ORAL
  Filled 2023-04-09: qty 2

## 2023-04-09 MED ORDER — DIPHENHYDRAMINE HCL 50 MG/ML IJ SOLN
12.5000 mg | Freq: Once | INTRAMUSCULAR | Status: AC
  Administered 2023-04-09: 12.5 mg via INTRAVENOUS
  Filled 2023-04-09: qty 1

## 2023-04-09 MED ORDER — IOHEXOL 350 MG/ML SOLN
75.0000 mL | Freq: Once | INTRAVENOUS | Status: AC | PRN
  Administered 2023-04-09: 75 mL via INTRAVENOUS

## 2023-04-09 MED ORDER — CLOPIDOGREL BISULFATE 75 MG PO TABS
75.0000 mg | ORAL_TABLET | Freq: Every day | ORAL | Status: DC
  Administered 2023-04-10: 75 mg via ORAL
  Filled 2023-04-09: qty 1

## 2023-04-09 MED ORDER — PROCHLORPERAZINE EDISYLATE 10 MG/2ML IJ SOLN
5.0000 mg | Freq: Once | INTRAMUSCULAR | Status: AC
  Administered 2023-04-09: 5 mg via INTRAVENOUS
  Filled 2023-04-09: qty 2

## 2023-04-09 NOTE — Consult Note (Addendum)
NEUROLOGY CONSULT NOTE   Date of service: April 09, 2023 Patient Name: [name] MRN:  [id number] DOB:  [birth year] Chief Complaint: "Headache" Requesting Provider: Cathren Laine, MD  History of Present Illness  [name] is a 77 year old woman with a past medical history significant for prior possible complex migraine with left-sided weakness, prior possible MRI negative brainstem stroke for which she received tPA (03/2018, with residual intermittent balance issues), heart block with pacemaker, left subdural neuralgia, left lumbar radiculopathy, concern for autonomic dysfunction with labile blood pressures, remote breast cancer, questionable chart diagnosis of atrial fibrillation (not mentioned in cardiology notes)  She has been having increased dizziness for the past 2 months after the unfortunate passing of her daughter 2.5 months ago (unexpected complications after surgery), with 2 days of increased "difficulty focusing."  At 5 PM today she began to have a severe headache affecting her right eye and right face.  This was associated with some speech difficulty and possible right-sided weakness for which EMS activated code stroke.  She did take 2 pills of ibuprofen (but is not sure of the tablet strength) without relief of her headache.  Husband reports that due to her frequent dizziness with ambulation he typically does not leave and stays near her when she is walking to provide assistance as needed  Husband and [name] reported no medication changes other than stopping gabapentin approximately 1 week ago.  However on review of EMR notes, specifically patient message thread starting 03/01/2023 with Dr. Odessa Reynolds office with messages continuing through the Nov 15th, she was started on hydralazine 25 mg nightly for high blood pressure, and there may have been some adjustment of Mestinon dose as well.  Florinef was potentially adjusted in October (per notes dose increased to  0.3 in the beginning of October and midodrine increased to 10 three times daily at the same time).  She has very labile blood pressures including severe hypertension at times to the 200s as well as significant orthostatic hypotension frequently leading to falls.  She last took her midodrine at 6 PM and husband reports that they have stopped her hydralazine  Regarding her symptoms in 2019 she presented with dizziness and was given tPA.  MRI was negative but with residual symptoms she was felt to have had a possible MRI negative brainstem stroke  She denied history of complicated migraine but per notes she had a headache on 10/07/2014 after sneezing with left-sided face arm and leg weakness, word finding problems and garbled speech with MRI negative for acute stroke  LKW: Possibly 5 PM but some vague confusion for 2 days prior to admission Modified rankin score: 3-Moderate disability-requires help but walks WITHOUT assistance IV Thrombolysis: No, nondisabling symptoms, and some symptoms over the past 2 days EVT: No, no LVO    NIHSS components Score: Comment  1a Level of Conscious 0[x]  1[]  2[]  3[]      1b LOC Questions 0[x]  1[]  2[]       1c LOC Commands 0[x]  1[]  2[]       2 Best Gaze 0[x]  1[]  2[]       3 Visual 0[x]  1[]  2[]  3[]      4 Facial Palsy 0[x]  1[]  2[]  3[]    Intermittent slight right nasolabial fold flattening at rest, symmetric on smiling  5a Motor Arm - left 0[]  1[x]  2[]  3[]  4[]  UN[]  Initially a left arm drift less than right arm, on repeat evaluation after CT, drift in both arms resolved  5b Motor Arm - Right 0[]  1[x]   2[]  3[]  4[]  UN[]    6a Motor Leg - Left 0[x]  1[]  2[]  3[]  4[]  UN[]    6b Motor Leg - Right 0[]  1[]  2[x]  3[]  4[]  UN[]  Initially very poor/variable effort with the right lower extremity, on repeat testing after CTA this resolved  7 Limb Ataxia 0[]  1[]  2[x]  3[]  UN[]   Tremor in all 4 extremities  8 Sensory 0[x]  1[]  2[]  UN[]      9 Best Language 0[]  1[x]  2[]  3[]    Mildly disfluent  speech, nondisabling, at baseline after head CT  10 Dysarthria 0[x]  1[]  2[]  UN[]      11 Extinct. and Inattention 0[x]  1[]  2[]       TOTAL:       ROS  Comprehensive ROS performed and pertinent positives documented in HPI   Past History   Past Medical History:  Diagnosis Date   ADHD (attention deficit hyperactivity disorder)    Allergy    trees/pollen, mold, fungus, dust mites. Takes allergy shots   Arthritis    PAIN AND OA LEFT HIP   Asthma    allergist Dr Illene Labrador- monthly allergy injections   Autonomic dysfunction    CENTRAL NERVOUS SYSTEM NEUROPATHY - DX BY DR. Sandria Manly MORE THAN 10 YRS AGO - AND IT IS FELT TO CONTRIBUTE TO THE AUTOMIC  DYSFUNCTION-- PT HAS NUMBNESS LEGS AND FEET AND SOMETIIMES TIPS OF FINGER, SEVERE CONSTIPATION( NO SENSATION TO HAVE BM ), DOUBLE VISION, ORTHOSTATIC HYPOTENSION   Blood transfusion    Breast cancer (HCC)    right side   Bruises easily    Cataract    Chest pain    a. 12/2012 Cath: nl cors, EF 55-65%.   Complication of anesthesia    reaction to some anesthetics/ 7/12 anesth record on chart- states prefers epidural   CVA (cerebrovascular accident) (HCC) 03/21/2018   Depression    Diverticulosis    Dysrhythmia    HX OF HIGH GRADE HEART BLOCK - REQUIRED PACEMAKER INSERTION   Esophageal stricture    Gastritis    GERD (gastroesophageal reflux disease)    H/O hiatal hernia    Hemorrhoids    Hernia of abdominal wall    spigelian hernia RLQ - SURGERY TO REPAIR   Hypothyroidism    Interstitial cystitis    Latex allergy, contact dermatitis    Mallory - Weiss tear    HEALED    Neuromuscular disorder (HCC)    central nervous system neuropathy- seen per Dr Sandria Manly   Pacemaker    Pericardial effusion    a. 12/2012 following ppm placement;  b. 01/01/2013 Echo: EF 55-60%, small pericardial effusion w/o RV collapse-->No need for tap/window.   PONV (postoperative nausea and vomiting)    pt needs scop patch   Recurrent upper respiratory infection (URI) 1/13- to  present   bronchitis following surgery- states improved but still with cough. OV with [facility] Dr [name] 09/06/11 on chart   Shortness of breath    AT TIMES - BUT MUCH IMPROVED AFTER PACEMAKER WAS REPROGRAMED.   Symptomatic bradycardia    a. 12/2012 s/p MDT dual chamber PPM, ser # [id number]; b. 12/2012 post-op course complicated by pericardial effusinon req lead revision.   Thyroid disease     Past Surgical History:  Procedure Laterality Date   ABDOMINAL HYSTERECTOMY  1974   BACK SURGERY     cervical fusion 4-5 with plate   BLADDER SUSPENSION     BREAST BIOPSY  2002   NO BLOOD PRESSURES ON RIGHT SIDE/   s/p  axillary node  dissection   CYSTO WITH HYDRODISTENSION  09/07/2011   Procedure: CYSTOSCOPY/HYDRODISTENSION;  Surgeon: Kathi Ludwig, MD;  Location: [facility];  Service: Urology;  Laterality: N/A;  INSTILLATION OF MARCAINE/PYRIDIUM INSTILLATION OF MARCAINE/KENALOG   CYSTOSCOPY  1975, 2007   EYE SURGERY     LASIK EYE SURGERY BILATERAL   LAPAROSCOPY  1973   LEAD REVISION N/A 12/30/2012   Procedure: LEAD REVISION;  Surgeon: Marinus Maw, MD;  Location: [facility] CATH LAB;  Service: Cardiovascular;  Laterality: N/A;   LEFT HEART CATHETERIZATION WITH CORONARY ANGIOGRAM N/A 12/29/2012   Procedure: LEFT HEART CATHETERIZATION WITH CORONARY ANGIOGRAM;  Surgeon: Peter M Swaziland, MD;  Location: [facility] CATH LAB;  Service: Cardiovascular;  Laterality: N/A;   MASTECTOMY MODIFIED RADICAL     right; with immediate reconstruction   MYRINGOPLASTY  1962   NECK SURGERY     c4-5 ruptured disc   OOPHORECTOMY  1982   PACEMAKER INSERTION     PERMANENT PACEMAKER INSERTION N/A 12/29/2012   Procedure: PERMANENT PACEMAKER INSERTION;  Surgeon: Duke Salvia, MD;  Location: [facility] CATH LAB;  Service: Cardiovascular;  Laterality: N/A;   PPM GENERATOR CHANGEOUT N/A 02/27/2023   Procedure: PPM GENERATOR CHANGEOUT;  Surgeon: Duke Salvia, MD;  Location: [facility] INVASIVE CV LAB;  Service: Cardiovascular;  Laterality: N/A;    RECTOCELE REPAIR     with cystocele repair   TONSILLECTOMY     TOTAL HIP ARTHROPLASTY Right    TOTAL HIP ARTHROPLASTY Left 06/12/2013   Procedure: LEFT TOTAL HIP ARTHROPLASTY ANTERIOR APPROACH;  Surgeon: Kathryne Hitch, MD;  Location: [facility];  Service: Orthopedics;  Laterality: Left;   TYMPANOPLASTY  1973   right   ULNAR NERVE TRANSPOSITION Right    VENTRAL HERNIA REPAIR  05/30/2011   Procedure: HERNIA REPAIR VENTRAL ADULT;  Surgeon: Currie Paris, MD;  Location: [facility];  Service: General;  Laterality: Right;  repair right spigelian hernia    Family History: Family History  Problem Relation Age of Onset   Heart disease Father    Colon cancer Mother 52   Depression Mother    Pancreatic cancer Sister 65   Diverticulitis Sister    Uterine cancer Maternal Grandmother    Heart disease Brother    Heart disease Paternal Grandmother    Heart disease Paternal Grandfather    Throat cancer Maternal Uncle    Heart disease Paternal Aunt    Heart disease Paternal Uncle    Heart disease Brother    Thrombosis Maternal Aunt    Cancer Maternal Uncle        NOS   Diabetes Other        aunt    Social History  reports that she has never smoked. She has never used smokeless tobacco. She reports current alcohol use of about 7.0 standard drinks of alcohol per week. She reports that she does not use drugs.  Allergies  Allergen Reactions   Anesthetics, Amide Other (See Comments)    Patient unsure of names, however multiples cause swelling of airway & nausea   Clindamycin Swelling   Shellfish Allergy Other (See Comments)    Neurotoxic reaction   Sulfonamide Derivatives Anaphylaxis and Swelling   Neomycin Swelling   Zolpidem Tartrate Other (See Comments)    Jerking motions    Azithromycin Other (See Comments)    Severe gastritis    Bactroban Other (See Comments)    Causes sores in nose   Ciprofloxacin     joint swelling  Codeine Swelling   Meperidine Hcl  Nausea Only    Hallucinations    Morphine Nausea Only    Hallucinations    Neosporin [Neomycin-Polymyxin-Gramicidin] Hives and Dermatitis    All topical "orin's ointment"   Nitrofurantoin     neuropathy in legs   Penicillins Other (See Comments)    Swelling in joints   Percocet [Oxycodone-Acetaminophen]     Dizziness    Tramadol     Makes jerk   Latex Rash    Medications   Current Facility-Administered Medications:    acetaminophen (TYLENOL) tablet 1,000 mg, 1,000 mg, Oral, Once, Lavine Hargrove L, MD   clopidogrel (PLAVIX) tablet 300 mg, 300 mg, Oral, Once **FOLLOWED BY** [START ON 04/10/2023] clopidogrel (PLAVIX) tablet 75 mg, 75 mg, Oral, Daily, Armie Moren L, MD   diphenhydrAMINE (BENADRYL) injection 12.5 mg, 12.5 mg, Intravenous, Once, Chan Sheahan L, MD   prochlorperazine (COMPAZINE) injection 5 mg, 5 mg, Intravenous, Once, Lyna Laningham L, MD  Current Outpatient Medications:    acetaminophen (TYLENOL) 500 MG tablet, Take 1,000 mg by mouth 2 (two) times daily as needed for moderate pain., Disp: , Rfl:    ALPRAZolam (XANAX) 0.25 MG tablet, Take 0.25 mg by mouth daily as needed for anxiety., Disp: , Rfl:    aspirin EC 81 MG EC tablet, Take 1 tablet (81 mg total) by mouth daily., Disp: , Rfl:    budesonide (ENTOCORT EC) 3 MG 24 hr capsule, Take 3 mg by mouth daily., Disp: , Rfl:    Calcium Carbonate-Vitamin D (CALTRATE 600+D PO), Take 1 tablet by mouth daily., Disp: , Rfl:    cetirizine (ZYRTEC) 10 MG tablet, Take 10 mg by mouth 2 (two) times daily., Disp: , Rfl:    cholecalciferol (VITAMIN D) 1000 UNITS tablet, Take 1,000 Units by mouth daily., Disp: , Rfl:    cycloSPORINE (RESTASIS) 0.05 % ophthalmic emulsion, Place 1 drop into both eyes 2 (two) times daily as needed (dry eyes)., Disp: , Rfl:    diclofenac Sodium (VOLTAREN) 1 % GEL, Apply 1 application  topically daily as needed (pain)., Disp: , Rfl:    DULoxetine (CYMBALTA) 60 MG capsule, Take 60 mg by mouth daily.,  Disp: , Rfl:    estradiol (ESTRING) 2 MG vaginal ring, Place 2 mg vaginally every 3 (three) months. follow package directions, Disp: , Rfl:    fludrocortisone (FLORINEF) 0.1 MG tablet, Take 1 tablet (0.1 mg total) by mouth daily., Disp: 270 tablet, Rfl: 3   folic acid (FOLVITE) 400 MCG tablet, Take 400 mcg by mouth daily., Disp: , Rfl:    gabapentin (NEURONTIN) 300 MG capsule, Take 2 capsules (600 mg total) by mouth 3 (three) times daily. (Patient taking differently: Take 300 mg by mouth at bedtime.), Disp: 180 capsule, Rfl: 3   hydrALAZINE (APRESOLINE) 25 MG tablet, Take 1 tablet (25 mg total) by mouth at bedtime., Disp: 90 tablet, Rfl: 3   levothyroxine (SYNTHROID) 50 MCG tablet, Take 50 mcg by mouth 3 (three) times a week. Take 50 mcg daily on Tuesday,Thursday, and Sunday, Disp: , Rfl:    levothyroxine (SYNTHROID) 75 MCG tablet, Take 75 mcg by mouth 4 (four) times a week. Pt takes on Monday,Wednesday,Friday, and Saturday, Disp: , Rfl:    Magnesium 250 MG TABS, Take 250 mg by mouth daily., Disp: , Rfl:    meclizine (ANTIVERT) 25 MG tablet, Take 25 mg by mouth 3 (three) times daily as needed for dizziness. , Disp: , Rfl:    Melatonin 5 MG  CAPS, Take 5 mg by mouth at bedtime., Disp: , Rfl:    midodrine (PROAMATINE) 10 MG tablet, Take 1.5 tablets (15 mg total) by mouth 3 (three) times daily., Disp: 405 tablet, Rfl: 2   Omega-3 Fatty Acids (OMEGA 3 PO), Take 1,280 mg by mouth daily., Disp: , Rfl:    potassium chloride (KLOR-CON M) 10 MEQ tablet, Take 2 tablets (20 mEq total) by mouth daily., Disp: , Rfl:    Probiotic Product (ALIGN) 4 MG CAPS, Take 4 mg by mouth at bedtime. , Disp: , Rfl:    pyridostigmine (MESTINON) 60 MG tablet, Take 0.5 tablets (30 mg total) by mouth 2 (two) times daily., Disp: 90 tablet, Rfl: 3   rosuvastatin (CRESTOR) 10 MG tablet, Take 1 tablet (10 mg total) by mouth daily. (Patient taking differently: Take 10 mg by mouth at bedtime.), Disp: 90 tablet, Rfl: 0   vitamin B-12  (CYANOCOBALAMIN) 500 MCG tablet, Take 500 mcg by mouth daily., Disp: , Rfl:   Facility-Administered Medications Ordered in Other Encounters:    bupivacaine (MARCAINE) 0.5 % 10 mL, triamcinolone acetonide (KENALOG-40) 40 mg injection, , Subcutaneous, Once, Patsi Sears, Sigmund, MD   bupivacaine (MARCAINE) 0.5 % 15 mL, phenazopyridine (PYRIDIUM) 400 mg bladder mixture, , Bladder Instillation, Once, Jethro Bolus, MD  Vitals   Vitals:   2023-04-16 2100 16-Apr-2023 2105 2023/04/16 2109 16-Apr-2023 2110  BP: (!) 166/118  (!) 181/88   Pulse: 74 80  72  Resp: 18 19 16 16   Temp:      TempSrc:      SpO2: 97% 99%  95%  Weight:        Body mass index is 24.79 kg/m.  Physical Exam   Constitutional: Appears well-developed and well-nourished.  Psych: Affect mildly anxious, cooperative, pleasant Eyes: No scleral injection.  HENT: No OP obstruction.  Head: Normocephalic.  Cardiovascular: Normal rate and regular rhythm.  Respiratory: Effort normal, non-labored breathing.  GI: Soft.  No distension. There is no tenderness.  Skin: WDI.   Neurologic Examination   Neuro: Mental Status: Patient is awake, alert, oriented to person, place, month, year, and situation. Patient is able to give a clear and coherent history. Mild stuttering speech and slight word finding difficulty which patient and husband are inconsistent with regards to being different from her baseline.  However they agree that the symptoms are nondisabling. Cranial Nerves: II: Visual Fields are full. Pupils are equal, round, and reactive to light.   III,IV, VI: EOMI without ptosis or diploplia.  V: Facial sensation is symmetric to light touch but allodynia to touch on the right side VII: Facial movement is notable for mild right nasolabial fold flattening when talking and at rest, similar to patient's photograph on EMR and at baseline per husband.  Symmetric activation when asked to smile VIII: hearing is intact to voice X: Uvula  elevates symmetrically XII: tongue is midline without atrophy or fasciculations.  Motor: Variable weakness of the right arm and leg.  At worst drift straight to the bed.  At best no drift Sensory: Reports similar sensation to light touch bilaterally in arms and legs Cerebellar: Distractible tremor in all 4 extremities, variable amplitude/frequency   Labs/Imaging/Neurodiagnostic studies   CBC:  Recent Labs  Lab Apr 16, 2023 2033 04-16-23 2034  WBC  --  8.8  NEUTROABS  --  4.7  HGB 13.3 13.8  HCT 39.0 40.2  MCV  --  92.2  PLT  --  230   Basic Metabolic Panel:  Lab Results  Component  Value Date   NA 133 (L) 04/09/2023   K 3.2 (L) 04/09/2023   CO2 24 04/09/2023   GLUCOSE 107 (H) 04/09/2023   BUN 17 04/09/2023   CREATININE 0.78 04/09/2023   CALCIUM 9.3 04/09/2023   GFRNONAA >60 04/09/2023   GFRAA 100 04/19/2020   Lipid Panel:  Lab Results  Component Value Date   LDLCALC 50 06/20/2018   HgbA1c:  Lab Results  Component Value Date   HGBA1C 5.4 03/22/2018   Urine Drug Screen:     Component Value Date/Time   LABOPIA NONE DETECTED 03/21/2018 1539   COCAINSCRNUR NONE DETECTED 03/21/2018 1539   LABBENZ NONE DETECTED 03/21/2018 1539   AMPHETMU NONE DETECTED 03/21/2018 1539   THCU NONE DETECTED 03/21/2018 1539   LABBARB NONE DETECTED 03/21/2018 1539    Alcohol Level     Component Value Date/Time   ETH <10 04/09/2023 2034   INR  Lab Results  Component Value Date   INR 1.0 04/09/2023   APTT  Lab Results  Component Value Date   APTT 26 04/09/2023   AED levels: No results found for: "PHENYTOIN", "ZONISAMIDE", "LAMOTRIGINE", "LEVETIRACETA"  CT Head without contrast(Personally reviewed): 1. No acute intracranial abnormality. 2. ASPECTS is 10. 3. Atrophy with moderate chronic microvascular ischemic disease.  CT angio Head and Neck with contrast(Personally reviewed): No LVO on my review, agree with radiology report 1. Negative CTA of the head and neck. No large  vessel occlusion or other emergent finding. 2. Aortic Atherosclerosis (ICD10-I70.0).   ECHO 02/12/2023 1. Left ventricular ejection fraction, by estimation, is 55 to 60%. The  left ventricle has normal function. The left ventricle has no regional  wall motion abnormalities. There is mild concentric left ventricular  hypertrophy. Left ventricular diastolic  parameters are consistent with Grade I diastolic dysfunction (impaired  relaxation).   2. Right ventricular systolic function is normal. The right ventricular  size is normal.   3. Left atrial size was mildly dilated.   4. Right atrial size was mildly dilated.   5. The mitral valve is normal in structure. Trivial mitral valve  regurgitation. No evidence of mitral stenosis.   6. The aortic valve is tricuspid. There is mild calcification of the  aortic valve. Aortic valve regurgitation is not visualized. Aortic valve  sclerosis/calcification is present, without any evidence of aortic  stenosis.   7. The inferior vena cava is normal in size with greater than 50%  respiratory variability, suggesting right atrial pressure of 3 mmHg.    ASSESSMENT   Patient is presenting with acute neurological symptoms in the setting of hypertension.  Suspect complex headache as the primary etiology of her symptoms, potentially triggered by her labile blood pressure with significant hypertension at this time, however she certainly has risk factors for stroke (age, blood pressure, hyperlipidemia, possible prior stroke/TIA).  Will err on the side of treating for stroke/TIA with dual antiplatelet therapy for now.  In shared decision-making with patient/husband at bedside, given some of her symptoms have been ongoing for 2 days and the symptoms at time of presentation are nondisabling, risk of TNK administration was felt to outweigh benefit  RECOMMENDATIONS  # Stroke/TIA vs. Complex migraine - Stroke labs HgbA1c, fasting lipid panel - MRI brain, pacer  protocol. If not able due to recent generator replacement, repeat Head CT in 12-24 hrs will need to be ordered - CTA completed without LVO on my initial read of limited series; full radiology report pending - Frequent neuro checks - Echocardiogram recently  completed, does not need to be repeated - Prophylactic therapy-Antiplatelet med: Aspirin - dose 325mg  PO or 300mg  PR, followed by 81 mg daily - Plavix 300 mg load with 75 mg daily for 21 day course (though would again counsel patient on the risk of bleeding with falls on this medication when she feels better from a headache perspective, given frequent reported falls) - Risk factor modification - Telemetry monitoring - Recommend clarification of her A-fib history, does not seem to have any clearly documented A-fib, consider pacemaker interrogation for confirmation if needed - Blood pressure goal   - Permissive hypertension to 220/120 for now - PT consult, OT consult, Speech consult, unless patient is back to baseline - Stroke team to follow  # Headache - Compazine 5 mg, Benadryl 12.5 mg, Tylenol 1000 mg x 1 dose - Encourage fluid intake after passing swallow eval  ______________________________________________________________________   Brooke Dare MD-PhD [facility] [phone]  Available 7 PM to 7 AM, outside of these hours please call Neurologist on call as listed on Amion.    CRITICAL CARE Performed by: Gordy Councilman   Total critical care time: 60 minutes  Critical care time was exclusive of separately billable procedures and treating other patients.  Critical care was necessary to treat or prevent imminent or life-threatening deterioration; emergent evaluation for consideration of thrombectomy or thrombolytic  Critical care was time spent personally by me on the following activities: development of treatment plan with patient and/or surrogate as well as nursing, discussions with consultants, evaluation of  patient's response to treatment, examination of patient, obtaining history from patient or surrogate, ordering and performing treatments and interventions, ordering and review of laboratory studies, ordering and review of radiographic studies, pulse oximetry and re-evaluation of patient's condition.

## 2023-04-09 NOTE — ED Triage Notes (Signed)
Pt BIB [facility] as a CODE STROKE. Per report LNK 1700 11/26 - acute onset of aphasia, right sided weakness, and worst headache (right side) of her life.

## 2023-04-09 NOTE — ED Notes (Signed)
PA Britni at bedside informed of change in neuro assessment - pt reports dizziness. Sts she had intermittent dizziness for the past 2 months. Also reports ongoing headache - no change

## 2023-04-09 NOTE — ED Provider Notes (Signed)
[place] EMERGENCY DEPARTMENT AT [facility] Provider Note   CSN: [id number] Arrival date & time: 04/09/23  2027  An emergency department physician performed an initial assessment on this suspected stroke patient at 2028.  History  Chief Complaint  Patient presents with   Code Stroke    [name] is a 77 y.o. female history of prior CVA?  Hypertension, hyperlipidemia, pacemaker due to AV block here for evaluation of sudden onset dysphagia and right-sided weakness.  Started at 5 PM today.  On arrival she is made a code stroke.  Symptoms improved on the emergency department.  She has a history of complicated migraine.  Sounds like she has actually been having some difficulty over the last few days with "fogginess".  No neck stiffness, chest pain, shortness of breath.  No recent falls or injuries.  States she has had recurrent dizziness as well as syncope.  She has been followed by cardiology for this.  HPI     Home Medications Prior to Admission medications   Medication Sig Start Date End Date Taking? Authorizing Provider  acetaminophen (TYLENOL) 500 MG tablet Take 1,000 mg by mouth 2 (two) times daily as needed for moderate pain.   Yes [provider]  ALPRAZolam (XANAX) 0.25 MG tablet Take 0.25 mg by mouth daily as needed for anxiety. 01/23/23  Yes [provider]  aspirin EC 81 MG EC tablet Take 1 tablet (81 mg total) by mouth daily. 03/25/18  Yes Layne Benton, NP  budesonide (ENTOCORT EC) 3 MG 24 hr capsule Take 3 mg by mouth daily.   Yes [provider]  Calcium Carbonate-Vitamin D (CALTRATE 600+D PO) Take 1 tablet by mouth daily.   Yes [provider]  cetirizine (ZYRTEC) 10 MG tablet Take 10 mg by mouth 2 (two) times daily.   Yes [provider]  cholecalciferol (VITAMIN D) 1000 UNITS tablet Take 1,000 Units by mouth daily.   Yes [provider]  cycloSPORINE (RESTASIS) 0.05 % ophthalmic emulsion  Place 1 drop into both eyes 2 (two) times daily as needed (dry eyes). 12/13/20  Yes [provider]  diclofenac Sodium (VOLTAREN) 1 % GEL Apply 1 application  topically daily as needed (pain).   Yes [provider]  DULoxetine (CYMBALTA) 60 MG capsule Take 60 mg by mouth daily.   Yes [provider]  estradiol (ESTRING) 2 MG vaginal ring Place 2 mg vaginally every 3 (three) months. follow package directions   Yes [provider]  fludrocortisone (FLORINEF) 0.1 MG tablet Take 1 tablet (0.1 mg total) by mouth daily. 02/27/23  Yes Duke Salvia, MD  folic acid (FOLVITE) 400 MCG tablet Take 400 mcg by mouth daily.   Yes [provider]  hydrALAZINE (APRESOLINE) 25 MG tablet Take 1 tablet (25 mg total) by mouth at bedtime. 03/26/23   Duke Salvia, MD  levothyroxine (SYNTHROID) 50 MCG tablet Take 50 mcg by mouth 3 (three) times a week. Take 50 mcg daily on Tuesday,Thursday, and Sunday    [provider]  levothyroxine (SYNTHROID) 75 MCG tablet Take 75 mcg by mouth 4 (four) times a week. Pt takes on Monday,Wednesday,Friday, and Saturday    [provider]  Magnesium 250 MG TABS Take 250 mg by mouth daily.    [provider]  meclizine (ANTIVERT) 25 MG tablet Take 25 mg by mouth 3 (three) times daily as needed for dizziness.     [provider]  Melatonin 5 MG  CAPS Take 5 mg by mouth at bedtime.    [provider]  midodrine (PROAMATINE) 10 MG tablet Take 1.5 tablets (15 mg total) by mouth 3 (three) times daily. 03/26/23   Duke Salvia, MD  Omega-3 Fatty Acids (OMEGA 3 PO) Take 1,280 mg by mouth daily.    [provider]  potassium chloride (KLOR-CON M) 10 MEQ tablet Take 2 tablets (20 mEq total) by mouth daily. 01/21/23   Graciella Freer, PA-C  Probiotic Product (ALIGN) 4 MG CAPS Take 4 mg by mouth at bedtime.     [provider]  pyridostigmine (MESTINON) 60 MG tablet Take 0.5 tablets (30  mg total) by mouth 2 (two) times daily. 03/19/23   Duke Salvia, MD  rosuvastatin (CRESTOR) 10 MG tablet Take 1 tablet (10 mg total) by mouth daily. Patient taking differently: Take 10 mg by mouth at bedtime. 07/03/18   Ihor Austin, NP  vitamin B-12 (CYANOCOBALAMIN) 500 MCG tablet Take 500 mcg by mouth daily.    [provider]     Allergies    Anesthetics, amide; Clindamycin; Shellfish allergy; Sulfonamide derivatives; Neomycin; Zolpidem tartrate; Azithromycin; Bactroban; Ciprofloxacin; Codeine; Meperidine hcl; Morphine; Neosporin [neomycin-polymyxin-gramicidin]; Nitrofurantoin; Penicillins; Percocet [oxycodone-acetaminophen]; Tramadol; and Latex    Review of Systems   Review of Systems  Constitutional: Negative.   HENT: Negative.    Respiratory: Negative.    Cardiovascular: Negative.   Gastrointestinal: Negative.   Genitourinary: Negative.   Musculoskeletal: Negative.   Neurological:  Positive for tremors, facial asymmetry, speech difficulty, weakness, numbness and headaches. Negative for dizziness, seizures, syncope and light-headedness.  All other systems reviewed and are negative.  Physical Exam Updated Vital Signs BP (!) 116/99   Pulse 84   Temp (!) 97 F (36.1 C) (Temporal)   Resp 19   Wt 71.8 kg   SpO2 99%   BMI 24.79 kg/m  Physical Exam Vitals and nursing note reviewed.  Constitutional:      General: She is not in acute distress.    Appearance: She is well-developed. She is not ill-appearing, toxic-appearing or diaphoretic.  HENT:     Head: Normocephalic and atraumatic.     Nose: Nose normal.     Mouth/Throat:     Mouth: Mucous membranes are moist.  Eyes:     Pupils: Pupils are equal, round, and reactive to light.  Cardiovascular:     Rate and Rhythm: Normal rate.     Pulses: Normal pulses.          Radial pulses are 2+ on the right side and 2+ on the left side.       Dorsalis pedis pulses are 2+ on the right side and 2+ on the left side.      Heart sounds: Normal heart sounds.  Pulmonary:     Effort: Pulmonary effort is normal. No respiratory distress.     Breath sounds: Normal breath sounds.  Abdominal:     General: Bowel sounds are normal. There is no distension.     Palpations: Abdomen is soft.     Tenderness: There is no abdominal tenderness. There is no guarding or rebound.  Musculoskeletal:        General: Normal range of motion.     Cervical back: Normal range of motion.  Skin:    General: Skin is warm and dry.  Neurological:     Mental Status: She is alert.     Cranial Nerves: Cranial nerve deficit present.     Motor:  Weakness present.     Comments: Tremors bilateral hands Slight facial droop Lifts bilateral legs off bed without difficulty Possible slight decreased right hand grip Speaking with neurology at bedside  Psychiatric:        Mood and Affect: Mood normal.    ED Results / Procedures / Treatments   Labs (all labs ordered are listed, but only abnormal results are displayed) Labs Reviewed  COMPREHENSIVE METABOLIC PANEL - Abnormal; Notable for the following components:      Result Value   Sodium 133 (*)    Potassium 3.2 (*)    Glucose, Bld 107 (*)    All other components within normal limits  URINALYSIS, ROUTINE W REFLEX MICROSCOPIC - Abnormal; Notable for the following components:   APPearance HAZY (*)    Glucose, UA 50 (*)    Bacteria, UA RARE (*)    Crystals PRESENT (*)    All other components within normal limits  I-STAT CHEM 8, ED - Abnormal; Notable for the following components:   Potassium 3.2 (*)    Glucose, Bld 102 (*)    Calcium, Ion 1.12 (*)    All other components within normal limits  PROTIME-INR  APTT  CBC  DIFFERENTIAL  ETHANOL  RAPID URINE DRUG SCREEN, HOSP PERFORMED  CBG MONITORING, ED    EKG None  Radiology CT ANGIO HEAD NECK W WO CM (CODE STROKE)  Result Date: 04/09/2023 CLINICAL DATA:  Initial evaluation for neuro deficit, stroke suspected. EXAM: CT  ANGIOGRAPHY HEAD AND NECK WITH AND WITHOUT CONTRAST TECHNIQUE: Multidetector CT imaging of the head and neck was performed using the standard protocol during bolus administration of intravenous contrast. Multiplanar CT image reconstructions and MIPs were obtained to evaluate the vascular anatomy. Carotid stenosis measurements (when applicable) are obtained utilizing NASCET criteria, using the distal internal carotid diameter as the denominator. RADIATION DOSE REDUCTION: This exam was performed according to the departmental dose-optimization program which includes automated exposure control, adjustment of the mA and/or kV according to patient size and/or use of iterative reconstruction technique. CONTRAST:  75mL OMNIPAQUE IOHEXOL 350 MG/ML SOLN COMPARISON:  CT from earlier the same day. FINDINGS: CTA NECK FINDINGS Aortic arch: Examination degraded by motion artifact. Aortic arch within normal limits for caliber with stated branch pattern. Mild aortic atherosclerosis. No stenosis. Right carotid system: Right common and internal carotid arteries are patent without stenosis or dissection. Left carotid system: Left common and internal carotid arteries are patent without stenosis or dissection. Vertebral arteries: Both vertebral arteries arise from subclavian arteries. Vertebral arteries patent without stenosis or dissection. Skeleton: No discrete or worrisome osseous lesions. Prior ACDF at C5-C7. Mild chronic compression deformity noted at the superior endplate of T3. Other neck: No other acute finding. Upper chest: Left-sided pacemaker/AICD noted. Mild interlobular septal thickening noted, likely mild pulmonary interstitial congestion. No other acute finding. Review of the MIP images confirms the above findings CTA HEAD FINDINGS Anterior circulation: Both internal carotid arteries are patent through the siphons without significant stenosis. A1 segments, anterior communicating artery complex common anterior cerebral  arteries patent without stenosis. No M1 stenosis or occlusion. Distal MCA branches perfused and symmetric. Posterior circulation: Both V4 segments widely patent. Left PICA patent. Right PICA origin not well seen. Basilar patent without stenosis. Superior cerebellar and posterior cerebral arteries patent bilaterally. Venous sinuses: Not well assessed due to timing of the contrast bolus. Anatomic variants: None significant.  No aneurysm. Review of the MIP images confirms the above findings IMPRESSION: 1. Negative CTA of the  head and neck. No large vessel occlusion or other emergent finding. 2.  Aortic Atherosclerosis (ICD10-I70.0). Electronically Signed   By: Rise Mu M.D.   On: 04/09/2023 21:57   CT HEAD CODE STROKE WO CONTRAST  Result Date: 04/09/2023 CLINICAL DATA:  Code stroke. Initial evaluation for neuro deficit, stroke suspected. EXAM: CT HEAD WITHOUT CONTRAST TECHNIQUE: Contiguous axial images were obtained from the base of the skull through the vertex without intravenous contrast. RADIATION DOSE REDUCTION: This exam was performed according to the departmental dose-optimization program which includes automated exposure control, adjustment of the mA and/or kV according to patient size and/or use of iterative reconstruction technique. COMPARISON:  Prior study from 04/01/2023 FINDINGS: Brain: Generalized age-related cerebral atrophy with moderate chronic microvascular ischemic disease. No acute intracranial hemorrhage. No acute large vessel territory infarct. No mass lesion or midline shift. No hydrocephalus or extra-axial fluid collection. Vascular: No abnormal hyperdense vessel. Calcified atherosclerosis present at the skull base. Skull: Scalp soft tissues and calvarium within normal limits. Sinuses/Orbits: Globes orbital soft tissues within normal limits. Paranasal sinuses are clear. No mastoid effusion. Other: None. ASPECTS [facility] Stroke Program Early CT Score) - Ganglionic level infarction  (caudate, lentiform nuclei, internal capsule, insula, M1-M3 cortex): 7 - Supraganglionic infarction (M4-M6 cortex): 3 Total score (0-10 with 10 being normal): 10 IMPRESSION: 1. No acute intracranial abnormality. 2. ASPECTS is 10. 3. Atrophy with moderate chronic microvascular ischemic disease. These results were communicated to Dr. Iver Nestle at 8:47 pm on 04/09/2023 by text page via the [facility] messaging system. Electronically Signed   By: Rise Mu M.D.   On: 04/09/2023 20:48    Procedures Procedures    Medications Ordered in ED Medications  clopidogrel (PLAVIX) tablet 300 mg (300 mg Oral Given 04/09/23 2125)    Followed by  clopidogrel (PLAVIX) tablet 75 mg (has no administration in time range)  iohexol (OMNIPAQUE) 350 MG/ML injection 75 mL (75 mLs Intravenous Contrast Given 04/09/23 2047)  diphenhydrAMINE (BENADRYL) injection 12.5 mg (12.5 mg Intravenous Given 04/09/23 2125)  prochlorperazine (COMPAZINE) injection 5 mg (5 mg Intravenous Given 04/09/23 2125)  acetaminophen (TYLENOL) tablet 1,000 mg (1,000 mg Oral Given 04/09/23 2125)   ED Course/ Medical Decision Making/ A&P Clinical Course as of 04/09/23 2226  Tue Apr 09, 2023  2223 Dr. Antionette Char with medicine to see and evaluate for admission [BH]    Clinical Course User Index [BH] Destiny Trickey A, PA-C   77 year old multiple medical problems here for evaluation under code stroke.  Developed headache, difficulty speaking as well as right-sided weakness around 5 PM.  Patient was immediately seen by neurology at the bridge immediately taken to the CT scanner.  By the time she arrived to the ED room her symptoms had been improving.  Neurology, Dr. Iver Nestle at bedside.  Suspect complicated migraine given rapidly improving symptoms.  CT head did not show any significant abnormality.  Plan on migraine cocktail.  No TNK given due to minimal symptoms at this time  Labs and imaging personally viewed and interpreted:  CBC without  leukocytosis Metabolic panel without significant abnormality CT head wo significant abnormality CTA neg MRI pending, needs to be done with med rep here  Patient reassessed.  She had some mild improvement in her headache with migraine cocktail however still has headache.  Also admits to intermittent dizziness however states this is chronic in nature.  No new dizziness.  Her neurologic exam is same as when I first saw her.  Discussed with Dr. Evonnie Pat.  Recommends admission to  the medicine team for stroke workup given her history and risk factors.  CONSULT with Dr. Antionette Char with medicine who will see in consult  The patient appears reasonably stabilized for admission considering the current resources, flow, and capabilities available in the ED at this time, and I doubt any other [facility] requiring further screening and/or treatment in the ED prior to admission.                                 Medical Decision Making Amount and/or Complexity of Data Reviewed Independent Historian: spouse and EMS External Data Reviewed: labs, radiology, ECG and notes. Labs: ordered. Decision-making details documented in ED Course. Radiology: ordered and independent interpretation performed. Decision-making details documented in ED Course. ECG/medicine tests: ordered and independent interpretation performed. Decision-making details documented in ED Course.  Risk OTC drugs. Prescription drug management. Parenteral controlled substances. Decision regarding hospitalization. Diagnosis or treatment significantly limited by social determinants of health.          Final Clinical Impression(s) / ED Diagnoses Final diagnoses:  Acute nonintractable headache, unspecified headache type  Weakness  TIA (transient ischemic attack)    Rx / DC Orders ED Discharge Orders     None         Khala Tarte A, PA-C 04/09/23 2226    Cathren Laine, MD 04/16/23 [phone]

## 2023-04-09 NOTE — ED Notes (Signed)
[facility] informed of pt transport to [facility]

## 2023-04-09 NOTE — ED Notes (Signed)
Pt admitted to [facility]

## 2023-04-09 NOTE — Code Documentation (Signed)
Responded to Code Stroke called at 2012 for aphasia, [id number]. Pt arrived at 2027, CBG-98, NIH-7. CT head negative for acute changes, CTA-no LVO. Pt's symptoms improved after CT scan done to [facility]-2.  TNK not given-symptoms too mild to treat. Plan to treat for complicated migraine. Please do VS/neuro checks q2h x12, then q4h.

## 2023-04-10 ENCOUNTER — Observation Stay (HOSPITAL_COMMUNITY): Payer: Medicare Other

## 2023-04-10 DIAGNOSIS — R29818 Other symptoms and signs involving the nervous system: Secondary | ICD-10-CM | POA: Diagnosis not present

## 2023-04-10 DIAGNOSIS — J45909 Unspecified asthma, uncomplicated: Secondary | ICD-10-CM | POA: Diagnosis not present

## 2023-04-10 DIAGNOSIS — I951 Orthostatic hypotension: Secondary | ICD-10-CM | POA: Diagnosis not present

## 2023-04-10 DIAGNOSIS — E785 Hyperlipidemia, unspecified: Secondary | ICD-10-CM | POA: Diagnosis not present

## 2023-04-10 DIAGNOSIS — G43109 Migraine with aura, not intractable, without status migrainosus: Secondary | ICD-10-CM

## 2023-04-10 DIAGNOSIS — G319 Degenerative disease of nervous system, unspecified: Secondary | ICD-10-CM | POA: Diagnosis not present

## 2023-04-10 DIAGNOSIS — F32A Depression, unspecified: Secondary | ICD-10-CM

## 2023-04-10 LAB — CBC
HCT: 37.4 % (ref 36.0–46.0)
Hemoglobin: 12.7 g/dL (ref 12.0–15.0)
MCH: 30.8 pg (ref 26.0–34.0)
MCHC: 34 g/dL (ref 30.0–36.0)
MCV: 90.8 fL (ref 80.0–100.0)
Platelets: 215 10*3/uL (ref 150–400)
RBC: 4.12 MIL/uL (ref 3.87–5.11)
RDW: 12.6 % (ref 11.5–15.5)
WBC: 8.9 10*3/uL (ref 4.0–10.5)
nRBC: 0 % (ref 0.0–0.2)

## 2023-04-10 LAB — HEMOGLOBIN A1C
Hgb A1c MFr Bld: 5.7 % — ABNORMAL HIGH (ref 4.8–5.6)
Mean Plasma Glucose: 116.89 mg/dL

## 2023-04-10 LAB — LIPID PANEL
Cholesterol: 111 mg/dL (ref 0–200)
HDL: 46 mg/dL (ref >40)
LDL Cholesterol: 53 mg/dL (ref 0–99)
Total CHOL/HDL Ratio: 2.4 {ratio}
Triglycerides: 60 mg/dL (ref <150)
VLDL: 12 mg/dL (ref 0–40)

## 2023-04-10 MED ORDER — FENTANYL CITRATE PF 50 MCG/ML IJ SOSY: 25.0000 ug | PREFILLED_SYRINGE | INTRAMUSCULAR | Status: DC | PRN

## 2023-04-10 MED ORDER — ASPIRIN 81 MG PO TBEC
81.0000 mg | DELAYED_RELEASE_TABLET | Freq: Every day | ORAL | Status: DC
  Administered 2023-04-10: 81 mg via ORAL
  Filled 2023-04-10: qty 1

## 2023-04-10 MED ORDER — ACETAMINOPHEN 650 MG RE SUPP: 650.0000 mg | RECTAL | Status: DC | PRN

## 2023-04-10 MED ORDER — LEVOTHYROXINE SODIUM 50 MCG PO TABS
50.0000 ug | ORAL_TABLET | ORAL | Status: DC
Start: 2023-04-11 — End: 2023-04-10

## 2023-04-10 MED ORDER — POTASSIUM CHLORIDE CRYS ER 20 MEQ PO TBCR
20.0000 meq | EXTENDED_RELEASE_TABLET | Freq: Once | ORAL | Status: AC
  Administered 2023-04-10: 20 meq via ORAL
  Filled 2023-04-10: qty 1

## 2023-04-10 MED ORDER — FLUDROCORTISONE ACETATE 0.1 MG PO TABS
0.1000 mg | ORAL_TABLET | Freq: Every day | ORAL | Status: DC
  Administered 2023-04-10: 0.1 mg via ORAL
  Filled 2023-04-10: qty 1

## 2023-04-10 MED ORDER — PYRIDOSTIGMINE BROMIDE 60 MG PO TABS
30.0000 mg | ORAL_TABLET | Freq: Two times a day (BID) | ORAL | Status: DC
  Administered 2023-04-10: 30 mg via ORAL
  Filled 2023-04-10: qty 1

## 2023-04-10 MED ORDER — SODIUM CHLORIDE 0.9 % IV SOLN
12.5000 mg | Freq: Four times a day (QID) | INTRAVENOUS | Status: DC | PRN
  Filled 2023-04-10: qty 0.5

## 2023-04-10 MED ORDER — ROSUVASTATIN CALCIUM 5 MG PO TABS
10.0000 mg | ORAL_TABLET | Freq: Every day | ORAL | Status: DC
  Administered 2023-04-10: 10 mg via ORAL
  Filled 2023-04-10: qty 2

## 2023-04-10 MED ORDER — ENOXAPARIN SODIUM 40 MG/0.4ML IJ SOSY
40.0000 mg | PREFILLED_SYRINGE | INTRAMUSCULAR | Status: DC
  Administered 2023-04-10: 40 mg via SUBCUTANEOUS
  Filled 2023-04-10: qty 0.4

## 2023-04-10 MED ORDER — MELATONIN 5 MG PO TABS
5.0000 mg | ORAL_TABLET | Freq: Every day | ORAL | Status: DC
  Administered 2023-04-10: 5 mg via ORAL
  Filled 2023-04-10: qty 1

## 2023-04-10 MED ORDER — BUDESONIDE 3 MG PO CPEP
3.0000 mg | ORAL_CAPSULE | Freq: Every day | ORAL | Status: DC
  Administered 2023-04-10: 3 mg via ORAL
  Filled 2023-04-10: qty 1

## 2023-04-10 MED ORDER — SENNOSIDES-DOCUSATE SODIUM 8.6-50 MG PO TABS: 1.0000 | ORAL_TABLET | Freq: Every evening | ORAL | Status: DC | PRN

## 2023-04-10 MED ORDER — LEVOTHYROXINE SODIUM 75 MCG PO TABS
75.0000 ug | ORAL_TABLET | ORAL | Status: DC
Start: 2023-04-10 — End: 2023-04-10
  Administered 2023-04-10: 75 ug via ORAL
  Filled 2023-04-10: qty 1

## 2023-04-10 MED ORDER — ACETAMINOPHEN 325 MG PO TABS: 650.0000 mg | ORAL_TABLET | ORAL | Status: DC | PRN

## 2023-04-10 MED ORDER — MECLIZINE HCL 25 MG PO TABS: 25.0000 mg | ORAL_TABLET | Freq: Three times a day (TID) | ORAL | Status: DC | PRN

## 2023-04-10 MED ORDER — ALPRAZOLAM 0.25 MG PO TABS: 0.2500 mg | ORAL_TABLET | Freq: Every day | ORAL | Status: DC | PRN

## 2023-04-10 MED ORDER — ACETAMINOPHEN 160 MG/5ML PO SOLN: 650.0000 mg | ORAL | Status: DC | PRN

## 2023-04-10 MED ORDER — ASPIRIN 325 MG PO TABS
325.0000 mg | ORAL_TABLET | Freq: Once | ORAL | Status: AC
  Administered 2023-04-10: 325 mg via ORAL
  Filled 2023-04-10: qty 1

## 2023-04-10 MED ORDER — DULOXETINE HCL 60 MG PO CPEP
60.0000 mg | ORAL_CAPSULE | Freq: Every day | ORAL | Status: DC
  Administered 2023-04-10: 60 mg via ORAL
  Filled 2023-04-10: qty 1

## 2023-04-10 MED ORDER — STROKE: EARLY STAGES OF RECOVERY BOOK
Freq: Once | Status: AC
  Filled 2023-04-10: qty 1

## 2023-04-10 NOTE — TOC Transition Note (Signed)
Transition of Care [facility]) - CM/SW Discharge Note   Patient Details  Name: [name] MRN: [id number] Date of Birth: 10/23/1945  Transition of Care [facility] CM/SW Contact:  Gordy Clement, RN Phone Number: 04/10/2023, 3:07 PM   Clinical Narrative:     Patient to DC to home today. Home Health PT OT and SLP will be provided by [facility]. Rolling walker and BSC provided by [employer] to be delivered bedside. Husband to transport            Patient Goals and CMS Choice      Discharge Placement                         Discharge Plan and Services Additional resources added to the After Visit Summary for                                       Social Determinants of Health (SDOH) Interventions SDOH Screenings   Food Insecurity: No Food Insecurity (04/10/2023)  Housing: Low Risk  (04/10/2023)  Transportation Needs: No Transportation Needs (04/10/2023)  Utilities: Not At Risk (04/10/2023)  Social Connections: Unknown (09/24/2021)   Received from [facility]  Tobacco Use: Low Risk  (04/09/2023)     Readmission Risk Interventions     No data to display

## 2023-04-10 NOTE — Progress Notes (Addendum)
PROGRESS NOTE        PATIENT DETAILS Name: [name] Age: 77 y.o. Sex: female Date of Birth: 1945-11-26 Admit Date: 04/09/2023 Admitting Physician Briscoe Deutscher, MD [id number], Richard, MD  Brief Summary: Patient is a 77 y.o.  female of orthostatic hypotension, complete heart block-s/p PPM in place, MRI negative brainstem stroke 2019-who presented with severe intractable right-sided headache along with right-sided weakness.  Subsequently admitted to [facility] for further evaluation.  Significant events: 11/26>> admit to [facility]  Significant studies: 10/01>> echo: EF 55-60% 11/26>> CT head: No acute intracranial abnormality 11/26>> CT angio head/neck: No occlusion/stenosis 11/27>> A1c: 5.7 11/27>> LDL: 53  Significant microbiology data: None  Procedures: None  Consults: Neurology  Subjective: Lying comfortably in bed-denies any chest pain or shortness of breath.  Headache has resolved.  Hardly any weakness on the right side today.  Objective: Vitals: Blood pressure 118/73, pulse 77, temperature 98.1 F (36.7 C), temperature source Oral, resp. rate 17, height 5\' 7"  (1.702 m), weight 71.8 kg, SpO2 94%.   Exam: Gen Exam:Alert awake-not in any distress HEENT:atraumatic, normocephalic Chest: B/L clear to auscultation anteriorly CVS:S1S2 regular Abdomen:soft non tender, non distended Extremities:no edema Neurology: Non focal Skin: no rash  Pertinent Labs/Radiology:    Latest Ref Rng & Units 04/10/2023    4:21 AM 04/09/2023    8:34 PM 04/09/2023    8:33 PM  CBC  WBC 4.0 - 10.5 K/uL 8.9  8.8    Hemoglobin 12.0 - 15.0 g/dL 62.1  30.8  65.7   Hematocrit 36.0 - 46.0 % 37.4  40.2  39.0   Platelets 150 - 400 K/uL 215  230      Lab Results  Component Value Date   NA 133 (L) 04/09/2023   K 3.2 (L) 04/09/2023   CL 101 04/09/2023   CO2 24 04/09/2023      Assessment/Plan: Complex migraine versus TIA/CVA Headache/right-sided weakness  has resolved MRI brain pending (planned for later today given pacemaker) Other workup as above Neuro MD has asked EP to interrogate PPM Continue aspirin/Plavix/statin Await neuro input.  HLD LDL at goal Continue Crestor  Orthostatic hypotension Chronic issue Continue pyridostigmine/Florinef  History of complete heart block PPM in place Telemetry monitoring EP to interrogate PPM later today  Bronchial asthma Not in exacerbation Bronchodilators  Hypothyroidism Synthroid  Depression/anxiety Stable As needed Xanax Cymbalta  History of complicated migraine Last headache from migraine was 8 years ago Follows with [place]  BMI: Estimated body mass index is 24.79 kg/m as calculated from the following:   Height as of this encounter: 5\' 7"  (1.702 m).   Weight as of this encounter: 71.8 kg.   Code status:   Code Status: Full Code   DVT Prophylaxis: enoxaparin (LOVENOX) injection 40 mg Start: 04/10/23 1000   Family Communication:  Spouse at bedside   Disposition Plan: Status is: Observation The patient remains OBS appropriate and will d/c before 2 midnights.   Planned Discharge Destination:Home   Diet: Diet Order             Diet regular Fluid consistency: Thin  Diet effective now                     Antimicrobial agents: Anti-infectives (From admission, onward)    None        MEDICATIONS: Scheduled Meds:  aspirin EC  81 mg Oral Daily   budesonide  3 mg Oral Daily   clopidogrel  75 mg Oral Daily   DULoxetine  60 mg Oral Daily   enoxaparin (LOVENOX) injection  40 mg Subcutaneous Q24H   fludrocortisone  0.1 mg Oral Daily   [START ON 04/11/2023] levothyroxine  50 mcg Oral Once per day on Tuesday Thursday Saturday   levothyroxine  75 mcg Oral Once per day on Monday Wednesday Friday Saturday   melatonin  5 mg Oral QHS   pyridostigmine  30 mg Oral BID   rosuvastatin  10 mg Oral QHS   Continuous Infusions:  promethazine  (PHENERGAN) injection (IM or IVPB)     PRN Meds:.acetaminophen **OR** acetaminophen (TYLENOL) oral liquid 160 mg/5 mL **OR** acetaminophen, ALPRAZolam, fentaNYL (SUBLIMAZE) injection, meclizine, promethazine (PHENERGAN) injection (IM or IVPB), senna-docusate   I have personally reviewed following labs and imaging studies  LABORATORY DATA: CBC: Recent Labs  Lab 04/09/23 2033 04/09/23 2034 04/10/23 0421  WBC  --  8.8 8.9  NEUTROABS  --  4.7  --   HGB 13.3 13.8 12.7  HCT 39.0 40.2 37.4  MCV  --  92.2 90.8  PLT  --  230 215    Basic Metabolic Panel: Recent Labs  Lab 04/09/23 2033 04/09/23 2034  NA 140 133*  K 3.2* 3.2*  CL 103 101  CO2  --  24  GLUCOSE 102* 107*  BUN 19 17  CREATININE 0.80 0.78  CALCIUM  --  9.3    GFR: Estimated Creatinine Clearance: 56.4 mL/min (by C-G formula based on SCr of 0.78 mg/dL).  Liver Function Tests: Recent Labs  Lab 04/09/23 2034  AST 27  ALT 18  ALKPHOS 43  BILITOT 0.8  PROT 7.3  ALBUMIN 4.1   No results for input(s): "LIPASE", "AMYLASE" in the last 168 hours. No results for input(s): "AMMONIA" in the last 168 hours.  Coagulation Profile: Recent Labs  Lab 04/09/23 2034  INR 1.0    Cardiac Enzymes: No results for input(s): "CKTOTAL", "CKMB", "CKMBINDEX", "TROPONINI" in the last 168 hours.  BNP (last 3 results) No results for input(s): "PROBNP" in the last 8760 hours.  Lipid Profile: Recent Labs    04/10/23 0421  CHOL 111  HDL 46  LDLCALC 53  TRIG 60  CHOLHDL 2.4    Thyroid Function Tests: No results for input(s): "TSH", "T4TOTAL", "FREET4", "T3FREE", "THYROIDAB" in the last 72 hours.  Anemia Panel: No results for input(s): "VITAMINB12", "FOLATE", "FERRITIN", "TIBC", "IRON", "RETICCTPCT" in the last 72 hours.  Urine analysis:    Component Value Date/Time   COLORURINE YELLOW 04/09/2023 2150   APPEARANCEUR HAZY (A) 04/09/2023 2150   LABSPEC 1.030 04/09/2023 2150   PHURINE 7.0 04/09/2023 2150   GLUCOSEU  50 (A) 04/09/2023 2150   HGBUR NEGATIVE 04/09/2023 2150   BILIRUBINUR NEGATIVE 04/09/2023 2150   KETONESUR NEGATIVE 04/09/2023 2150   PROTEINUR NEGATIVE 04/09/2023 2150   UROBILINOGEN 0.2 10/13/2014 1640   NITRITE NEGATIVE 04/09/2023 2150   LEUKOCYTESUR NEGATIVE 04/09/2023 2150    Sepsis Labs: Lactic Acid, Venous No results found for: "LATICACIDVEN"  MICROBIOLOGY: No results found for this or any previous visit (from the past 240 hour(s)).  RADIOLOGY STUDIES/RESULTS: CT ANGIO HEAD NECK W WO CM (CODE STROKE)  Result Date: 04/09/2023 CLINICAL DATA:  Initial evaluation for neuro deficit, stroke suspected. EXAM: CT ANGIOGRAPHY HEAD AND NECK WITH AND WITHOUT CONTRAST TECHNIQUE: Multidetector CT imaging of the head and neck was performed using the standard  protocol during bolus administration of intravenous contrast. Multiplanar CT image reconstructions and MIPs were obtained to evaluate the vascular anatomy. Carotid stenosis measurements (when applicable) are obtained utilizing NASCET criteria, using the distal internal carotid diameter as the denominator. RADIATION DOSE REDUCTION: This exam was performed according to the departmental dose-optimization program which includes automated exposure control, adjustment of the mA and/or kV according to patient size and/or use of iterative reconstruction technique. CONTRAST:  75mL OMNIPAQUE IOHEXOL 350 MG/ML SOLN COMPARISON:  CT from earlier the same day. FINDINGS: CTA NECK FINDINGS Aortic arch: Examination degraded by motion artifact. Aortic arch within normal limits for caliber with stated branch pattern. Mild aortic atherosclerosis. No stenosis. Right carotid system: Right common and internal carotid arteries are patent without stenosis or dissection. Left carotid system: Left common and internal carotid arteries are patent without stenosis or dissection. Vertebral arteries: Both vertebral arteries arise from subclavian arteries. Vertebral arteries patent  without stenosis or dissection. Skeleton: No discrete or worrisome osseous lesions. Prior ACDF at C5-C7. Mild chronic compression deformity noted at the superior endplate of T3. Other neck: No other acute finding. Upper chest: Left-sided pacemaker/AICD noted. Mild interlobular septal thickening noted, likely mild pulmonary interstitial congestion. No other acute finding. Review of the MIP images confirms the above findings CTA HEAD FINDINGS Anterior circulation: Both internal carotid arteries are patent through the siphons without significant stenosis. A1 segments, anterior communicating artery complex common anterior cerebral arteries patent without stenosis. No M1 stenosis or occlusion. Distal MCA branches perfused and symmetric. Posterior circulation: Both V4 segments widely patent. Left PICA patent. Right PICA origin not well seen. Basilar patent without stenosis. Superior cerebellar and posterior cerebral arteries patent bilaterally. Venous sinuses: Not well assessed due to timing of the contrast bolus. Anatomic variants: None significant.  No aneurysm. Review of the MIP images confirms the above findings IMPRESSION: 1. Negative CTA of the head and neck. No large vessel occlusion or other emergent finding. 2.  Aortic Atherosclerosis (ICD10-I70.0). Electronically Signed   By: Rise Mu M.D.   On: 04/09/2023 21:57   CT HEAD CODE STROKE WO CONTRAST  Result Date: 04/09/2023 CLINICAL DATA:  Code stroke. Initial evaluation for neuro deficit, stroke suspected. EXAM: CT HEAD WITHOUT CONTRAST TECHNIQUE: Contiguous axial images were obtained from the base of the skull through the vertex without intravenous contrast. RADIATION DOSE REDUCTION: This exam was performed according to the departmental dose-optimization program which includes automated exposure control, adjustment of the mA and/or kV according to patient size and/or use of iterative reconstruction technique. COMPARISON:  Prior study from  04/01/2023 FINDINGS: Brain: Generalized age-related cerebral atrophy with moderate chronic microvascular ischemic disease. No acute intracranial hemorrhage. No acute large vessel territory infarct. No mass lesion or midline shift. No hydrocephalus or extra-axial fluid collection. Vascular: No abnormal hyperdense vessel. Calcified atherosclerosis present at the skull base. Skull: Scalp soft tissues and calvarium within normal limits. Sinuses/Orbits: Globes orbital soft tissues within normal limits. Paranasal sinuses are clear. No mastoid effusion. Other: None. ASPECTS [facility] Stroke Program Early CT Score) - Ganglionic level infarction (caudate, lentiform nuclei, internal capsule, insula, M1-M3 cortex): 7 - Supraganglionic infarction (M4-M6 cortex): 3 Total score (0-10 with 10 being normal): 10 IMPRESSION: 1. No acute intracranial abnormality. 2. ASPECTS is 10. 3. Atrophy with moderate chronic microvascular ischemic disease. These results were communicated to Dr. Iver Nestle at 8:47 pm on 04/09/2023 by text page via the [facility] messaging system. Electronically Signed   By: Rise Mu M.D.   On: 04/09/2023 20:48  LOS: 0 days   Jeoffrey Massed, MD  [facility]    To contact the attending provider between 7A-7P or the covering provider during after hours 7P-7A, please log into the web site [email or web address] and access using universal [place] password for that web site. If you do not have the password, please call the hospital operator.  04/10/2023, 11:18 AM

## 2023-04-10 NOTE — Progress Notes (Addendum)
STROKE TEAM PROGRESS NOTE   BRIEF HPI Ms. [name] is a 77 y.o. female with history for prior possible complex migraine with left-sided weakness, prior possible MRI negative brainstem stroke for which she received tPA (03/2018, with residual intermittent balance issues), heart block with pacemaker, left subdural neuralgia, left lumbar radiculopathy, concern for autonomic dysfunction with labile blood pressures, remote breast cancer, questionable chart diagnosis of atrial fibrillation (not mentioned in cardiology notes) who presented with severe headache, speech difficulty and right sided weakness.   She has been having increased dizziness for the past 2 months after the unfortunate passing of her daughter 2.5 months ago (unexpected complications after surgery), with 2 days of increased "difficulty focusing." At 5 PM today she began to have a severe headache affecting her right eye and right face. This was associated with some speech difficulty and possible right-sided weakness for which EMS activated code stroke.   However on review of EMR notes, specifically patient message thread starting 03/01/2023 with Dr. Odessa Fleming office with messages continuing through the Nov 15th, she was started on hydralazine 25 mg nightly for high blood pressure, and there may have been some adjustment of Mestinon dose as well.  Florinef was potentially adjusted in October (per notes dose increased to 0.3 in the beginning of October and midodrine increased to 10 three times daily at the same time).  She has very labile blood pressures including severe hypertension at times to the 200s as well as significant orthostatic hypotension frequently leading to falls.  She last took her midodrine at 6 PM and husband reports that they have stopped her hydralazine   Regarding her symptoms in 2019 she presented with dizziness and was given tPA.  MRI was negative but with residual symptoms she was felt to have had a possible MRI  negative brainstem stroke   She denied history of complicated migraine but per notes she had a headache on 10/07/2014 after sneezing with left-sided face arm and leg weakness, word finding problems and garbled speech with MRI negative for acute stroke   LKW: Possibly 5 PM but some vague confusion for 2 days prior to admission Modified rankin score: 3-Moderate disability-requires help but walks WITHOUT assistance IV Thrombolysis: No, nondisabling symptoms, and some symptoms over the past 2 days EVT: No, no LVO   NIH on Admission 7  SIGNIFICANT HOSPITAL EVENTS CT/MRI negative for acute intracranial abnormality    INTERIM HISTORY/SUBJECTIVE Patient reports headache was located on the right side and retro-orbital. Pain was dull at first and then became sharp. Symptoms lasted several hours with accompanied vision difficulties, word finding difficulty, and right sided weakness. Patient reports history of dizziness, falls and syncopal episodes. Being worked up outpatient for autonomic dysregulation and on midodrine, florinef and mestinon for orthostatic hypotension. Standing Bps at home have been undectable or 70/40s otherwise.  Patient with new pacemaker replaced 3 weeks ago. Patient still grieving unexpected loss of daughter and hasn't been able to establish with a grief therapist yet.   Interrogate pacemaker  ASA and Plavix for 3 weeks and ASA alone  Follow-up with [facility] outpatient  Provided with therapy resources at [facility]   OBJECTIVE  CBC    Component Value Date/Time   WBC 8.9 04/10/2023 0421   RBC 4.12 04/10/2023 0421   HGB 12.7 04/10/2023 0421   HGB 14.4 01/18/2023 1259   HCT 37.4 04/10/2023 0421   HCT 44.4 01/18/2023 1259   PLT 215 04/10/2023 0421   PLT 231  01/18/2023 1259   MCV 90.8 04/10/2023 0421   MCV 97 01/18/2023 1259   MCH 30.8 04/10/2023 0421   MCHC 34.0 04/10/2023 0421   RDW 12.6 04/10/2023 0421   RDW 13.7 01/18/2023 1259   LYMPHSABS 3.2  04/09/2023 2034   MONOABS 0.8 04/09/2023 2034   EOSABS 0.1 04/09/2023 2034   BASOSABS 0.0 04/09/2023 2034    BMET    Component Value Date/Time   NA 133 (L) 04/09/2023 2034   NA 143 02/11/2023 1140   K 3.2 (L) 04/09/2023 2034   CL 101 04/09/2023 2034   CO2 24 04/09/2023 2034   GLUCOSE 107 (H) 04/09/2023 2034   BUN 17 04/09/2023 2034   BUN 9 02/11/2023 1140   CREATININE 0.78 04/09/2023 2034   CALCIUM 9.3 04/09/2023 2034   EGFR 80 02/11/2023 1140   GFRNONAA >60 04/09/2023 2034    IMAGING past 24 hours CT ANGIO HEAD NECK W WO CM (CODE STROKE)  Result Date: 04/09/2023 CLINICAL DATA:  Initial evaluation for neuro deficit, stroke suspected. EXAM: CT ANGIOGRAPHY HEAD AND NECK WITH AND WITHOUT CONTRAST TECHNIQUE: Multidetector CT imaging of the head and neck was performed using the standard protocol during bolus administration of intravenous contrast. Multiplanar CT image reconstructions and MIPs were obtained to evaluate the vascular anatomy. Carotid stenosis measurements (when applicable) are obtained utilizing NASCET criteria, using the distal internal carotid diameter as the denominator. RADIATION DOSE REDUCTION: This exam was performed according to the departmental dose-optimization program which includes automated exposure control, adjustment of the mA and/or kV according to patient size and/or use of iterative reconstruction technique. CONTRAST:  75mL OMNIPAQUE IOHEXOL 350 MG/ML SOLN COMPARISON:  CT from earlier the same day. FINDINGS: CTA NECK FINDINGS Aortic arch: Examination degraded by motion artifact. Aortic arch within normal limits for caliber with stated branch pattern. Mild aortic atherosclerosis. No stenosis. Right carotid system: Right common and internal carotid arteries are patent without stenosis or dissection. Left carotid system: Left common and internal carotid arteries are patent without stenosis or dissection. Vertebral arteries: Both vertebral arteries arise from  subclavian arteries. Vertebral arteries patent without stenosis or dissection. Skeleton: No discrete or worrisome osseous lesions. Prior ACDF at C5-C7. Mild chronic compression deformity noted at the superior endplate of T3. Other neck: No other acute finding. Upper chest: Left-sided pacemaker/AICD noted. Mild interlobular septal thickening noted, likely mild pulmonary interstitial congestion. No other acute finding. Review of the MIP images confirms the above findings CTA HEAD FINDINGS Anterior circulation: Both internal carotid arteries are patent through the siphons without significant stenosis. A1 segments, anterior communicating artery complex common anterior cerebral arteries patent without stenosis. No M1 stenosis or occlusion. Distal MCA branches perfused and symmetric. Posterior circulation: Both V4 segments widely patent. Left PICA patent. Right PICA origin not well seen. Basilar patent without stenosis. Superior cerebellar and posterior cerebral arteries patent bilaterally. Venous sinuses: Not well assessed due to timing of the contrast bolus. Anatomic variants: None significant.  No aneurysm. Review of the MIP images confirms the above findings IMPRESSION: 1. Negative CTA of the head and neck. No large vessel occlusion or other emergent finding. 2.  Aortic Atherosclerosis (ICD10-I70.0). Electronically Signed   By: Rise Mu M.D.   On: 04/09/2023 21:57   CT HEAD CODE STROKE WO CONTRAST  Result Date: 04/09/2023 CLINICAL DATA:  Code stroke. Initial evaluation for neuro deficit, stroke suspected. EXAM: CT HEAD WITHOUT CONTRAST TECHNIQUE: Contiguous axial images were obtained from the base of the skull through the vertex without intravenous  contrast. RADIATION DOSE REDUCTION: This exam was performed according to the departmental dose-optimization program which includes automated exposure control, adjustment of the mA and/or kV according to patient size and/or use of iterative reconstruction  technique. COMPARISON:  Prior study from 04/01/2023 FINDINGS: Brain: Generalized age-related cerebral atrophy with moderate chronic microvascular ischemic disease. No acute intracranial hemorrhage. No acute large vessel territory infarct. No mass lesion or midline shift. No hydrocephalus or extra-axial fluid collection. Vascular: No abnormal hyperdense vessel. Calcified atherosclerosis present at the skull base. Skull: Scalp soft tissues and calvarium within normal limits. Sinuses/Orbits: Globes orbital soft tissues within normal limits. Paranasal sinuses are clear. No mastoid effusion. Other: None. ASPECTS [facility] Stroke Program Early CT Score) - Ganglionic level infarction (caudate, lentiform nuclei, internal capsule, insula, M1-M3 cortex): 7 - Supraganglionic infarction (M4-M6 cortex): 3 Total score (0-10 with 10 being normal): 10 IMPRESSION: 1. No acute intracranial abnormality. 2. ASPECTS is 10. 3. Atrophy with moderate chronic microvascular ischemic disease. These results were communicated to Dr. Iver Nestle at 8:47 pm on 04/09/2023 by text page via the [facility] messaging system. Electronically Signed   By: Rise Mu M.D.   On: 04/09/2023 20:48    Vitals:   04/10/23 0200 04/10/23 0400 04/10/23 0452 04/10/23 0800  BP:  136/77  118/73  Pulse: 77 81  77  Resp: (!) 21 18  17   Temp:   98.4 F (36.9 C) 98.1 F (36.7 C)  TempSrc:   Oral Oral  SpO2: 94% 94%    Weight:      Height:       PHYSICAL EXAM General:  Alert, well-nourished, elderly, caucasian woman, well-developed patient in no acute distress Psych:  Mood and affect appropriate for situation CV: Regular rate and rhythm on monitor Respiratory:  Regular, unlabored respirations on room air GI: Abdomen soft and nontender   NEURO:  Mental Status: AA&Ox3, patient is able to give clear and coherent history Speech/Language: speech is without dysarthria or aphasia.   Cranial Nerves:  II: PERRL. Visual fields full.  III, IV, VI: EOMI.  Eyelids elevate symmetrically.  V: Sensation is intact to light touch and symmetrical to face.  VII: Face is symmetrical resting and smiling VIII: hearing intact to voice. IX, X: Palate elevates symmetrically. Phonation is normal.  XII: tongue is midline without fasciculations. Motor: Right extremities mildly weaker compared to left  Tone: is normal and bulk is normal Sensation- Intact to light touch bilaterally. Extinction absent to light touch to DSS.   Coordination: FTN intact bilaterally, mild ataxia and drift w/ RUE. Gait- deferred  Most Recent NIH 1    ASSESSMENT/PLAN  Most likely depression vs. Complex Migraine Code Stroke CT head No acute abnormality.   CTA head & neck No large vessel occlusion or other emergent finding.  MRI no acute infarct 2D Echo 02/12/23 EF 55-60%  Interrogate pacemaker no afib  LDL 53 HgbA1c 5.7 UDS negative  VTE prophylaxis - Lovenox  aspirin 81 mg daily prior to admission, continue Aspirin on discharge Therapy recommendations: none  Disposition:  Home  Orthostatic hypotension  Autonomic dysfunction Continue home pyridostigmine and fludrocortisone and midodrine BP normotensive last 24 hours  Follow up with Dr. Graciela Husbands cardiology Avoid low BP  Hyperlipidemia Home meds:  Crestor 10 mg resumed in hospital LDL 53, goal < 70 Continue home medication  Continue statin at discharge  Hx of complicated migraine  09/2014 admitted for right facial droop, diplopia and left facial/hand pain.  MRI no acute infarct.  CTA head and  neck negative, LE venous Doppler no DVT.  2D echo unremarkable.  Considered Complicated migraine.  LDL 114, A1c 5.5.  Discharged on aspirin and Lipitor 20. 03/2018 admitted for sudden dizziness.  Status post tPA.  MRI negative no acute stroke.  CT head and neck unremarkable.  EF 60 to 65% show LDL 112, A1c 5.4.  Discharged on DAPT and Lipitor 40.  Depression Pt undergoing hard time with grievance continue home cymbalta provided  with resources for [facility] for walk-in therapy hours   Other Stroke Risk Factors Complete heart block status post pacemaker  Other Active Problems Hypothyroidism: continue synthroid  Anxiety: continue home xanax prn  Gait disorder followed with Dr. Terrace Arabia at [facility] day # 1  Neurology will sign off. Please call with questions. Pt will follow up with Dr. Terrace Arabia at [facility] in about 4 weeks. Thanks for the consult.  Marvel Plan, MD PhD Stroke Neurology 04/10/2023 3:19 PM  To contact Stroke Continuity provider, please refer to [email or web address]. After hours, contact General Neurology

## 2023-04-10 NOTE — H&P (Signed)
History and Physical    [name] UYQ:[id number] DOB: 1946/02/09 DOA: 04/09/2023  PCP: Geoffry Paradise, MD   Patient coming from: Home   Chief Complaint: Speech difficulty, headache, dizzy, right-sided weakness    HPI: [name] is a 77 y.o. female with medical history significant for headaches, MRI negative brainstem stroke in 2019, heart block with pacemaker, lumbar radiculopathy, autonomic dysfunction, and orthostatic hypotension who presents with headache, speech difficulty, dizziness, and right-sided weakness.  Patient reports that she has had lightheadedness and dizziness for months, developed severe headache today, and was then noted to have speech difficulty and right-sided weakness.  Patient states that she developed the headache at approximately 5 PM, describes it as severe, sharp in character, and localized to the right periorbital region.  Around the same time, she had difficulty speaking, and right-sided weakness that prompted EMS to activate code stroke.  Patient denies any recent chest discomfort, fevers, or chills.  ED Course: Upon arrival to the ED, patient is found to be afebrile and saturating well on room air with normal heart rate and elevated blood pressure.  EKG demonstrates sinus rhythm first-degree of nodal block and IVCD with prolonged QT interval.  There are no acute findings on head CT or CTA head and neck.  Labs are most notable for potassium 3.2, normal renal function, and normal WBC.  Neurology evaluated the patient in the emergency department, suspects complex migraine versus less likely TIA or CVA, and recommended headache cocktail and admission for TIA/CVA workup.  Review of Systems:  All other systems reviewed and apart from HPI, are negative.  Past Medical History:  Diagnosis Date   ADHD (attention deficit hyperactivity disorder)    Allergy    trees/pollen, mold, fungus, dust mites. Takes allergy shots   Arthritis    PAIN AND OA  LEFT HIP   Asthma    allergist Dr Illene Labrador- monthly allergy injections   Autonomic dysfunction    CENTRAL NERVOUS SYSTEM NEUROPATHY - DX BY DR. Sandria Manly MORE THAN 10 YRS AGO - AND IT IS FELT TO CONTRIBUTE TO THE AUTOMIC  DYSFUNCTION-- PT HAS NUMBNESS LEGS AND FEET AND SOMETIIMES TIPS OF FINGER, SEVERE CONSTIPATION( NO SENSATION TO HAVE BM ), DOUBLE VISION, ORTHOSTATIC HYPOTENSION   Blood transfusion    Breast cancer (HCC)    right side   Bruises easily    Cataract    Chest pain    a. 12/2012 Cath: nl cors, EF 55-65%.   Complication of anesthesia    reaction to some anesthetics/ 7/12 anesth record on chart- states prefers epidural   CVA (cerebrovascular accident) (HCC) 03/21/2018   Depression    Diverticulosis    Dysrhythmia    HX OF HIGH GRADE HEART BLOCK - REQUIRED PACEMAKER INSERTION   Esophageal stricture    Gastritis    GERD (gastroesophageal reflux disease)    H/O hiatal hernia    Hemorrhoids    Hernia of abdominal wall    spigelian hernia RLQ - SURGERY TO REPAIR   Hypothyroidism    Interstitial cystitis    Latex allergy, contact dermatitis    Mallory - Weiss tear    HEALED    Neuromuscular disorder (HCC)    central nervous system neuropathy- seen per Dr Sandria Manly   Pacemaker    Pericardial effusion    a. 12/2012 following ppm placement;  b. 01/01/2013 Echo: EF 55-60%, small pericardial effusion w/o RV collapse-->No need for tap/window.   PONV (postoperative nausea and vomiting)    pt  needs scop patch   Recurrent upper respiratory infection (URI) 1/13- to present   bronchitis following surgery- states improved but still with cough. OV with [facility] Dr [name] 09/06/11 on chart   Shortness of breath    AT TIMES - BUT MUCH IMPROVED AFTER PACEMAKER WAS REPROGRAMED.   Symptomatic bradycardia    a. 12/2012 s/p MDT dual chamber PPM, ser # [id number]; b. 12/2012 post-op course complicated by pericardial effusinon req lead revision.   Thyroid disease     Past Surgical History:  Procedure  Laterality Date   ABDOMINAL HYSTERECTOMY  1974   BACK SURGERY     cervical fusion 4-5 with plate   BLADDER SUSPENSION     BREAST BIOPSY  2002   NO BLOOD PRESSURES ON RIGHT SIDE/   s/p  axillary node dissection   CYSTO WITH HYDRODISTENSION  09/07/2011   Procedure: CYSTOSCOPY/HYDRODISTENSION;  Surgeon: Kathi Ludwig, MD;  Location: [facility];  Service: Urology;  Laterality: N/A;  INSTILLATION OF MARCAINE/PYRIDIUM INSTILLATION OF MARCAINE/KENALOG   CYSTOSCOPY  1975, 2007   EYE SURGERY     LASIK EYE SURGERY BILATERAL   LAPAROSCOPY  1973   LEAD REVISION N/A 12/30/2012   Procedure: LEAD REVISION;  Surgeon: Marinus Maw, MD;  Location: [facility] CATH LAB;  Service: Cardiovascular;  Laterality: N/A;   LEFT HEART CATHETERIZATION WITH CORONARY ANGIOGRAM N/A 12/29/2012   Procedure: LEFT HEART CATHETERIZATION WITH CORONARY ANGIOGRAM;  Surgeon: Peter M Swaziland, MD;  Location: [facility] CATH LAB;  Service: Cardiovascular;  Laterality: N/A;   MASTECTOMY MODIFIED RADICAL     right; with immediate reconstruction   MYRINGOPLASTY  1962   NECK SURGERY     c4-5 ruptured disc   OOPHORECTOMY  1982   PACEMAKER INSERTION     PERMANENT PACEMAKER INSERTION N/A 12/29/2012   Procedure: PERMANENT PACEMAKER INSERTION;  Surgeon: Duke Salvia, MD;  Location: [facility] CATH LAB;  Service: Cardiovascular;  Laterality: N/A;   PPM GENERATOR CHANGEOUT N/A 02/27/2023   Procedure: PPM GENERATOR CHANGEOUT;  Surgeon: Duke Salvia, MD;  Location: [facility] INVASIVE CV LAB;  Service: Cardiovascular;  Laterality: N/A;   RECTOCELE REPAIR     with cystocele repair   TONSILLECTOMY     TOTAL HIP ARTHROPLASTY Right    TOTAL HIP ARTHROPLASTY Left 06/12/2013   Procedure: LEFT TOTAL HIP ARTHROPLASTY ANTERIOR APPROACH;  Surgeon: Kathryne Hitch, MD;  Location: [facility];  Service: Orthopedics;  Laterality: Left;   TYMPANOPLASTY  1973   right   ULNAR NERVE TRANSPOSITION Right    VENTRAL HERNIA REPAIR  05/30/2011   Procedure: HERNIA REPAIR VENTRAL  ADULT;  Surgeon: Currie Paris, MD;  Location: [facility];  Service: General;  Laterality: Right;  repair right spigelian hernia    Social History:   reports that she has never smoked. She has never used smokeless tobacco. She reports current alcohol use of about 7.0 standard drinks of alcohol per week. She reports that she does not use drugs.  Allergies  Allergen Reactions   Anesthetics, Amide Other (See Comments)    Patient unsure of names, however multiples cause swelling of airway & nausea   Clindamycin Swelling   Shellfish Allergy Other (See Comments)    Neurotoxic reaction   Sulfonamide Derivatives Anaphylaxis and Swelling   Neomycin Swelling   Zolpidem Tartrate Other (See Comments)    Jerking motions    Azithromycin Other (See Comments)    Severe gastritis    Bactroban Other (See Comments)    Causes  sores in nose   Ciprofloxacin     joint swelling   Codeine Swelling   Meperidine Hcl Nausea Only    Hallucinations    Morphine Nausea Only    Hallucinations    Neosporin [Neomycin-Polymyxin-Gramicidin] Hives and Dermatitis    All topical "orin's ointment"   Nitrofurantoin     neuropathy in legs   Penicillins Other (See Comments)    Swelling in joints   Percocet [Oxycodone-Acetaminophen]     Dizziness    Tramadol     Makes jerk   Latex Rash    Family History  Problem Relation Age of Onset   Heart disease Father    Colon cancer Mother 63   Depression Mother    Pancreatic cancer Sister 24   Diverticulitis Sister    Uterine cancer Maternal Grandmother    Heart disease Brother    Heart disease Paternal Grandmother    Heart disease Paternal Grandfather    Throat cancer Maternal Uncle    Heart disease Paternal Aunt    Heart disease Paternal Uncle    Heart disease Brother    Thrombosis Maternal Aunt    Cancer Maternal Uncle        NOS   Diabetes Other        aunt     Prior to Admission medications   Medication Sig Start Date End Date  Taking? Authorizing Provider  acetaminophen (TYLENOL) 500 MG tablet Take 1,000 mg by mouth 2 (two) times daily as needed for moderate pain.   Yes [provider]  ALPRAZolam (XANAX) 0.25 MG tablet Take 0.25 mg by mouth daily as needed for anxiety. 01/23/23  Yes [provider]  aspirin EC 81 MG EC tablet Take 1 tablet (81 mg total) by mouth daily. 03/25/18  Yes Layne Benton, NP  budesonide (ENTOCORT EC) 3 MG 24 hr capsule Take 3 mg by mouth daily.   Yes [provider]  Calcium Carbonate-Vitamin D (CALTRATE 600+D PO) Take 1 tablet by mouth daily.   Yes [provider]  cetirizine (ZYRTEC) 10 MG tablet Take 10 mg by mouth 2 (two) times daily.   Yes [provider]  cholecalciferol (VITAMIN D) 1000 UNITS tablet Take 1,000 Units by mouth daily.   Yes [provider]  cycloSPORINE (RESTASIS) 0.05 % ophthalmic emulsion Place 1 drop into both eyes 2 (two) times daily as needed (dry eyes). 12/13/20  Yes [provider]  diclofenac Sodium (VOLTAREN) 1 % GEL Apply 1 application  topically daily as needed (pain).   Yes [provider]  DULoxetine (CYMBALTA) 60 MG capsule Take 60 mg by mouth daily.   Yes [provider]  estradiol (ESTRING) 2 MG vaginal ring Place 2 mg vaginally every 3 (three) months. follow package directions   Yes [provider]  fludrocortisone (FLORINEF) 0.1 MG tablet Take 1 tablet (0.1 mg total) by mouth daily. 02/27/23  Yes Duke Salvia, MD  folic acid (FOLVITE) 400 MCG tablet Take 400 mcg by mouth daily.   Yes [provider]  levothyroxine (SYNTHROID) 50 MCG tablet Take 50 mcg by mouth 3 (three) times a week. Take 50 mcg daily on Tuesday,Thursday, and Sunday   Yes [provider]  levothyroxine (SYNTHROID) 75 MCG tablet Take 75 mcg by mouth 4 (four) times a week. Pt takes on Monday,Wednesday,Friday, and Saturday   Yes [provider]  Magnesium 250 MG TABS Take 250 mg  by mouth daily.   Yes [provider]  meclizine (ANTIVERT) 25 MG tablet Take 25 mg by mouth 3 (three) times daily as needed for dizziness.    Yes [provider]  Melatonin 5 MG CAPS Take 5 mg by mouth at bedtime.   Yes [provider]  midodrine (PROAMATINE) 10 MG tablet Take 1.5 tablets (15 mg total) by mouth 3 (three) times daily. 03/26/23  Yes Duke Salvia, MD  Omega 3 1200 MG CAPS Take 1,200 mg by mouth daily.   Yes [provider]  potassium chloride (KLOR-CON M) 10 MEQ tablet Take 2 tablets (20 mEq total) by mouth daily. 01/21/23  Yes Graciella Freer, PA-C  Probiotic Product (ALIGN) 4 MG CAPS Take 4 mg by mouth at bedtime.    Yes [provider]  pyridostigmine (MESTINON) 60 MG tablet Take 0.5 tablets (30 mg total) by mouth 2 (two) times daily. 03/19/23  Yes Duke Salvia, MD  rosuvastatin (CRESTOR) 10 MG tablet Take 1 tablet (10 mg total) by mouth daily. Patient taking differently: Take 10 mg by mouth at bedtime. 07/03/18  Yes McCue, Shanda Bumps, NP  vitamin B-12 (CYANOCOBALAMIN) 500 MCG tablet Take 500 mcg by mouth daily.   Yes [provider]    Physical Exam: Vitals:   04/09/23 2145 04/09/23 2200 04/09/23 2215 04/09/23 2230  BP:   (!) 116/99   Pulse:  77 84 74  Resp: (!) 21 (!) 21 19 16   Temp:      TempSrc:      SpO2:  100% 99% 100%  Weight:        Constitutional: NAD, no pallor or diaphoresis   Eyes: PERTLA, lids and conjunctivae normal ENMT: Mucous membranes are moist. Posterior pharynx clear of any exudate or lesions.   Neck: supple, no masses  Respiratory: no wheezing, no crackles. No accessory muscle use.  Cardiovascular: S1 & S2 heard, regular rate and rhythm. No JVD. Abdomen: No distension, no tenderness, soft. Bowel sounds active.  Musculoskeletal: no clubbing / cyanosis. No joint deformity upper and lower extremities.   Skin: no significant rashes, lesions, ulcers. Warm, dry, well-perfused. Neurologic:  CN 2-12 grossly intact. Sensation to light touch intact. Strength 5/5 throughout LUE and LLE, 3-4/5 involving RUE and RLE. Resting tremor. Alert and oriented.  Psychiatric: Calm. Cooperative.    Labs and Imaging on Admission: I have personally reviewed following labs and imaging studies  CBC: Recent Labs  Lab 04/09/23 2033 04/09/23 2034  WBC  --  8.8  NEUTROABS  --  4.7  HGB 13.3 13.8  HCT 39.0 40.2  MCV  --  92.2  PLT  --  230   Basic Metabolic Panel: Recent Labs  Lab 04/09/23 2033 04/09/23 2034  NA 140 133*  K 3.2* 3.2*  CL 103 101  CO2  --  24  GLUCOSE 102* 107*  BUN 19 17  CREATININE 0.80 0.78  CALCIUM  --  9.3   GFR: Estimated Creatinine Clearance: 56.4 mL/min (by C-G formula based on SCr of 0.78 mg/dL). Liver Function Tests: Recent Labs  Lab 04/09/23 2034  AST 27  ALT 18  ALKPHOS 43  BILITOT 0.8  PROT 7.3  ALBUMIN 4.1   No results for input(s): "LIPASE", "AMYLASE" in the last 168 hours. No results for input(s): "AMMONIA" in the last 168 hours. Coagulation Profile: Recent Labs  Lab 04/09/23 2034  INR 1.0   Cardiac Enzymes: No results for input(s): "CKTOTAL", "CKMB", "CKMBINDEX", "TROPONINI" in the last 168 hours. BNP (last 3 results) No results for input(s): "  PROBNP" in the last 8760 hours. HbA1C: No results for input(s): "HGBA1C" in the last 72 hours. CBG: Recent Labs  Lab 04/09/23 2029  GLUCAP 98   Lipid Profile: No results for input(s): "CHOL", "HDL", "LDLCALC", "TRIG", "CHOLHDL", "LDLDIRECT" in the last 72 hours. Thyroid Function Tests: No results for input(s): "TSH", "T4TOTAL", "FREET4", "T3FREE", "THYROIDAB" in the last 72 hours. Anemia Panel: No results for input(s): "VITAMINB12", "FOLATE", "FERRITIN", "TIBC", "IRON", "RETICCTPCT" in the last 72 hours. Urine analysis:    Component Value Date/Time   COLORURINE YELLOW 04/09/2023 2150   APPEARANCEUR HAZY (A) 04/09/2023 2150   LABSPEC 1.030 04/09/2023 2150   PHURINE 7.0 04/09/2023  2150   GLUCOSEU 50 (A) 04/09/2023 2150   HGBUR NEGATIVE 04/09/2023 2150   BILIRUBINUR NEGATIVE 04/09/2023 2150   KETONESUR NEGATIVE 04/09/2023 2150   PROTEINUR NEGATIVE 04/09/2023 2150   UROBILINOGEN 0.2 10/13/2014 1640   NITRITE NEGATIVE 04/09/2023 2150   LEUKOCYTESUR NEGATIVE 04/09/2023 2150   Sepsis Labs: @LABRCNTIP (procalcitonin:4,lacticidven:4) )No results found for this or any previous visit (from the past 240 hour(s)).   Radiological Exams on Admission: CT ANGIO HEAD NECK W WO CM (CODE STROKE)  Result Date: 04/09/2023 CLINICAL DATA:  Initial evaluation for neuro deficit, stroke suspected. EXAM: CT ANGIOGRAPHY HEAD AND NECK WITH AND WITHOUT CONTRAST TECHNIQUE: Multidetector CT imaging of the head and neck was performed using the standard protocol during bolus administration of intravenous contrast. Multiplanar CT image reconstructions and MIPs were obtained to evaluate the vascular anatomy. Carotid stenosis measurements (when applicable) are obtained utilizing NASCET criteria, using the distal internal carotid diameter as the denominator. RADIATION DOSE REDUCTION: This exam was performed according to the departmental dose-optimization program which includes automated exposure control, adjustment of the mA and/or kV according to patient size and/or use of iterative reconstruction technique. CONTRAST:  75mL OMNIPAQUE IOHEXOL 350 MG/ML SOLN COMPARISON:  CT from earlier the same day. FINDINGS: CTA NECK FINDINGS Aortic arch: Examination degraded by motion artifact. Aortic arch within normal limits for caliber with stated branch pattern. Mild aortic atherosclerosis. No stenosis. Right carotid system: Right common and internal carotid arteries are patent without stenosis or dissection. Left carotid system: Left common and internal carotid arteries are patent without stenosis or dissection. Vertebral arteries: Both vertebral arteries arise from subclavian arteries. Vertebral arteries patent without  stenosis or dissection. Skeleton: No discrete or worrisome osseous lesions. Prior ACDF at C5-C7. Mild chronic compression deformity noted at the superior endplate of T3. Other neck: No other acute finding. Upper chest: Left-sided pacemaker/AICD noted. Mild interlobular septal thickening noted, likely mild pulmonary interstitial congestion. No other acute finding. Review of the MIP images confirms the above findings CTA HEAD FINDINGS Anterior circulation: Both internal carotid arteries are patent through the siphons without significant stenosis. A1 segments, anterior communicating artery complex common anterior cerebral arteries patent without stenosis. No M1 stenosis or occlusion. Distal MCA branches perfused and symmetric. Posterior circulation: Both V4 segments widely patent. Left PICA patent. Right PICA origin not well seen. Basilar patent without stenosis. Superior cerebellar and posterior cerebral arteries patent bilaterally. Venous sinuses: Not well assessed due to timing of the contrast bolus. Anatomic variants: None significant.  No aneurysm. Review of the MIP images confirms the above findings IMPRESSION: 1. Negative CTA of the head and neck. No large vessel occlusion or other emergent finding. 2.  Aortic Atherosclerosis (ICD10-I70.0). Electronically Signed   By: Rise Mu M.D.   On: 04/09/2023 21:57   CT HEAD CODE STROKE WO CONTRAST  Result Date: 04/09/2023 CLINICAL  DATA:  Code stroke. Initial evaluation for neuro deficit, stroke suspected. EXAM: CT HEAD WITHOUT CONTRAST TECHNIQUE: Contiguous axial images were obtained from the base of the skull through the vertex without intravenous contrast. RADIATION DOSE REDUCTION: This exam was performed according to the departmental dose-optimization program which includes automated exposure control, adjustment of the mA and/or kV according to patient size and/or use of iterative reconstruction technique. COMPARISON:  Prior study from 04/01/2023  FINDINGS: Brain: Generalized age-related cerebral atrophy with moderate chronic microvascular ischemic disease. No acute intracranial hemorrhage. No acute large vessel territory infarct. No mass lesion or midline shift. No hydrocephalus or extra-axial fluid collection. Vascular: No abnormal hyperdense vessel. Calcified atherosclerosis present at the skull base. Skull: Scalp soft tissues and calvarium within normal limits. Sinuses/Orbits: Globes orbital soft tissues within normal limits. Paranasal sinuses are clear. No mastoid effusion. Other: None. ASPECTS [facility] Stroke Program Early CT Score) - Ganglionic level infarction (caudate, lentiform nuclei, internal capsule, insula, M1-M3 cortex): 7 - Supraganglionic infarction (M4-M6 cortex): 3 Total score (0-10 with 10 being normal): 10 IMPRESSION: 1. No acute intracranial abnormality. 2. ASPECTS is 10. 3. Atrophy with moderate chronic microvascular ischemic disease. These results were communicated to Dr. Iver Nestle at 8:47 pm on 04/09/2023 by text page via the [facility] messaging system. Electronically Signed   By: Rise Mu M.D.   On: 04/09/2023 20:48    EKG: Independently reviewed. Sinus rhythm, 1st degree AV block, IVCD, QTc 574.   Assessment/Plan   1. Acute neurologic symptoms  - Appreciate neurology assessment and recommendations, suspecting complex headache vs less-likely CVA/TIA  - Continue cardiac monitoring and frequent neuro checks, check lipids, A1c, and MRI brain, load with ASA 325 and Plavix 300 mg followed by 81 and 75 mg daily, consult PT/OT/SLP   2. Autonomic dysfunction; orthostatic hypotension  - Continue pyridostigmine and fludrocortisone, hold midodrine for now given severely elevated BP    3. Hypothyroidism  - Levothyroxine    4. Hypokalemia  - Replacing    5. Depression, anxiety  - Continue Cymbalta and as-needed Xanax    6. Asthma  - Not in exacerbation on admission  - Albuterol as-needed    DVT prophylaxis: Lovenox   Code Status: Full  Level of Care: Level of care: Telemetry Medical Family Communication: Husband at bedside  Disposition Plan:  Patient is from: Home  Anticipated d/c is to: TBD Anticipated d/c date is: 11/27 or 04/11/23  Patient currently: Pending TIA/CVA workup, pain-control, therapy assessments  Consults called: Neurology  Admission status: Observation     Briscoe Deutscher, MD [facility]  04/10/2023, 12:02 AM

## 2023-04-10 NOTE — Evaluation (Signed)
Occupational Therapy Evaluation Patient Details Name: [name] MRN: [id number] DOB: July 04, 1945 Today's Date: 04/10/2023   History of Present Illness 77 yo female presented to [facility] with headache, speech difficulty, dizziness, and right sided weakness. CT scans negative for any acute findings, atrophy with microvascular ischemic disease noted on CT 04/09/23 by MD Steinl.  PMH significant for headaches, heart block with pacemaker, lumbar radiculopathy, autonomic dysfunction, orthostatic hypotension and MRI negative brainstem stroke (2019)?   Clinical Impression   Pt admitted for above, she presents with listed visual /vestibular deficits and was nauseous just sitting EOB, unable to progress to standing on OT eval. Pt also notes having an extensive hx of falls due to syncopal episodes, she reports that when her head tingles it is a sign that she will fall once OOB (to which her head was tingling when sitting EOB as well). Introduced gaze stabilization techniques, pt would benefit from further acute OT to introduce compensatory strategies to complete ADLs/mobility without increased adverse symptoms. Recommend pt follow-up with her eye doctor or vision therapists to introduce AE such as prism glasses and etc for visual deficits.        If plan is discharge home, recommend the following: Assistance with cooking/housework;A little help with bathing/dressing/bathroom;A little help with walking and/or transfers;Other (comment) (supervision due to falls risk)    Functional Status Assessment  Patient has had a recent decline in their functional status and/or demonstrates limited ability to make significant improvements in function in a reasonable and predictable amount of time  Equipment Recommendations  None recommended by OT    Recommendations for Other Services       Precautions / Restrictions Precautions Precaution Comments: hx of diplopia Restrictions Weight Bearing Restrictions: No       Mobility Bed Mobility Overal bed mobility: Needs Assistance Bed Mobility: Supine to Sit, Sit to Supine     Supine to sit: Mod assist, Used rails, HOB elevated Sit to supine: Contact guard assist   General bed mobility comments: Mod A to raise trunk into upright position    Transfers                   General transfer comment: Deferred; dizziness and nausea      Balance Overall balance assessment: Needs assistance Sitting-balance support: Single extremity supported, Feet supported Sitting balance-Leahy Scale: Fair         Standing balance comment: needs further assessment                           ADL either performed or assessed with clinical judgement   ADL Overall ADL's : Needs assistance/impaired Eating/Feeding: Independent;Sitting   Grooming: Sitting;Set up;Wash/dry face   Upper Body Bathing: Sitting;Supervision/ safety;Set up   Lower Body Bathing: Sitting/lateral leans;Contact guard assist Lower Body Bathing Details (indicate cue type and reason): pt uses handheld hose at baseline, or brings legs to figure four position Upper Body Dressing : Sitting;Set up   Lower Body Dressing: Sitting/lateral leans;Contact guard assist Lower Body Dressing Details (indicate cue type and reason): brings legs to figure four position   Toilet Transfer Details (indicate cue type and reason): NT   Toileting - Clothing Manipulation Details (indicate cue type and reason): NT   Tub/Shower Transfer Details (indicate cue type and reason): NT   General ADL Comments: pt not able to bend at waist due to dizziness, normally brings BLEs up to her     Vision Patient Visual Report:  Diplopia;Nausea/blurring vision with head movement;Central vision impairment Additional Comments: baseline hx of diplopia, lack of lens accomodation, tracks L<>R without challenge but with increased speed pt gets very dizzy and was stratled. Sensitivity to light, dizzy with saccades  L<>R     Perception         Praxis         Pertinent Vitals/Pain Pain Assessment Pain Assessment: No/denies pain     Extremity/Trunk Assessment Upper Extremity Assessment Upper Extremity Assessment: RUE deficits/detail;Generalized weakness RUE Deficits / Details: Downward drift of R hand when eyes closed holding pizza box, bilat shoulders weak, elbow flexors 4/5 RUE Coordination: decreased fine motor           Communication Communication Communication: No apparent difficulties Cueing Techniques: Verbal cues   Cognition Arousal: Alert Behavior During Therapy: WFL for tasks assessed/performed Overall Cognitive Status: Within Functional Limits for tasks assessed                                       General Comments  Bp initially noted to be 85/74 sitting EOB, reassessed and it was 103/76(85) EOB    Exercises     Shoulder Instructions      Home Living Family/patient expects to be discharged to:: Private residence Living Arrangements: Spouse/significant other Available Help at Discharge: Family;Available 24 hours/day Type of Home: House Home Access: Stairs to enter [employer] of Steps: 2 from garage   Home Layout: Able to live on main level with bedroom/bathroom;Two level Alternate Level Stairs-Number of Steps: 12   Bathroom Shower/Tub: Producer, television/film/video: Handicapped height     Home Equipment: Shower seat - built in;Rollator (4 wheels)   Additional Comments: Pt notes 6-7 falls in the last month due to syncope. Pt reports she does not feel steady with rollator  Lives With: Spouse    Prior Functioning/Environment Prior Level of Function : Independent/Modified Independent;History of Falls (last six months);Needs assist       Physical Assist : Mobility (physical) Mobility (physical): Gait   Mobility Comments: husband occasionally provides CGA with getting around due to syncopal episodes ADLs Comments: ind         OT Problem List: Decreased strength;Impaired balance (sitting and/or standing);Impaired vision/perception      OT Treatment/Interventions: Self-care/ADL training;Balance training;Therapeutic exercise;Therapeutic activities;Patient/family education;Visual/perceptual remediation/compensation    OT Goals(Current goals can be found in the care plan section) Acute Rehab OT Goals Patient Stated Goal: To go home and get to outpt PT appointment OT Goal Formulation: With patient/family Time For Goal Achievement: 04/24/23 Potential to Achieve Goals: Good ADL Goals Pt Will Perform Grooming: with supervision;standing Pt Will Perform Lower Body Bathing: with modified independence;sitting/lateral leans Pt Will Perform Lower Body Dressing: with modified independence;sit to/from stand Pt Will Transfer to Toilet: with supervision;ambulating Additional ADL Goal #1: pt will independently complete gaze stabilization exercises to help with vestibular challenges.  OT Frequency: Min 1X/week    Co-evaluation              AM-PAC OT "6 Clicks" Daily Activity     Outcome Measure Help from another person eating meals?: None Help from another person taking care of personal grooming?: A Little Help from another person toileting, which includes using toliet, bedpan, or urinal?: A Little Help from another person bathing (including washing, rinsing, drying)?: A Little Help from another person to put on and taking off regular upper body  clothing?: A Little Help from another person to put on and taking off regular lower body clothing?: A Little 6 Click Score: 19   End of Session Nurse Communication: Mobility status  Activity Tolerance: Treatment limited secondary to medical complications (Comment) Patient left: in bed;with call bell/phone within reach;with bed alarm set;with family/visitor present  OT Visit Diagnosis: Dizziness and giddiness (R42);Unsteadiness on feet (R26.81);History of falling  (Z91.81);Muscle weakness (generalized) (M62.81);Other symptoms and signs involving the nervous system (R29.898)                Time: 2130-8657 OT Time Calculation (min): 43 min Charges:  OT General Charges $OT Visit: 1 Visit OT Evaluation $OT Eval Moderate Complexity: 1 Mod OT Treatments $Therapeutic Activity: 23-37 mins  04/10/2023  AB, OTR/L  Acute Rehabilitation Services  Office: [phone]   Tristan Schroeder 04/10/2023, 10:55 AM

## 2023-04-10 NOTE — Discharge Summary (Addendum)
PATIENT DETAILS Name: [name] Age: 77 y.o. Sex: female Date of Birth: 11/21/1945 MRN: [id number]. Admitting Physician: Briscoe Deutscher, MD NGE:[id number], [name], MD  Admit Date: 04/09/2023 Discharge date: 04/10/2023  Recommendations for Outpatient Follow-up:  Follow up with PCP in 1-2 weeks Please obtain CMP/CBC in one week Please ensure follow up with Neurology  Admitted From:  Home  Disposition: Home health   Discharge Condition: good  CODE STATUS:   Code Status: Full Code   Diet recommendation:  Diet Order             Diet regular Fluid consistency: Thin  Diet effective now           Diet - low sodium heart healthy                    Brief Summary: Patient is a 77 y.o.  female of orthostatic hypotension, complete heart block-s/p PPM in place, MRI negative brainstem stroke 2019-who presented with severe intractable right-sided headache along with right-sided weakness.  Subsequently admitted to [facility] for further evaluation.   Significant events: 11/26>> admit to [facility]   Significant studies: 10/01>> echo: EF 55-60% 11/26>> CT head: No acute intracranial abnormality 11/26>> CT angio head/neck: No occlusion/stenosis 11/27>> A1c: 5.7 11/27>> LDL: 53 11/27>>MRI brain: No CVA   Significant microbiology data: None   Procedures: None   Consults: Neurology   Brief Hospital Course: Complex migraine  Headache/right-sided weakness has resolved MRI brain negative for CVA PPM interrogated-per EP PA-C Brandi Ollis-negative for Afib Discussed with Dr Zackery Barefoot prior dose of ASA on d/c Follow with Dr Terrace Arabia at [facility] migraine episode was 8 years back   HLD LDL at goal Continue Crestor   Orthostatic hypotension Chronic issue Continue pyridostigmine/Florinef   History of complete heart block PPM in place   Bronchial asthma Not in exacerbation Bronchodilators   Hypothyroidism Synthroid    Depression/anxiety Stable As needed Xanax Cymbalta   History of complicated migraine Last headache from migraine was 8 years ago Follows with [facility]   BMI: Estimated body mass index is 24.79 kg/m as calculated from the following:   Height as of this encounter: 5\' 7"  (1.702 m).   Weight as of this encounter: 71.8 kg  Discharge Diagnoses:  Principal Problem:   Focal neurological deficit Active Problems:   Asthma   Hypokalemia   Hypothyroidism   Orthostatic hypotension   Complicated migraine   Depression   Discharge Instructions:  Activity:  As tolerated   Discharge Instructions     Ambulatory referral to Neurology   Complete by: As directed    An appointment is requested in approximately: 4 weeks   Diet - low sodium heart healthy   Complete by: As directed    Discharge instructions   Complete by: As directed    Follow with Primary MD  Geoffry Paradise, MD in 1-2 weeks  Please get a complete blood count and chemistry panel checked by your Primary MD at your next visit, and again as instructed by your Primary MD.  Get Medicines reviewed and adjusted: Please take all your medications with you for your next visit with your Primary MD  Laboratory/radiological data: Please request your Primary MD to go over all hospital tests and procedure/radiological results at the follow up, please ask your Primary MD to get all Hospital records sent to his/her office.  In some cases, they will be blood work, cultures and biopsy results pending at the time of your discharge.  Please request that your primary care M.D. follows up on these results.  Also Note the following: If you experience worsening of your admission symptoms, develop shortness of breath, life threatening emergency, suicidal or homicidal thoughts you must seek medical attention immediately by calling 911 or calling your MD immediately  if symptoms less severe.  You must read complete  instructions/literature along with all the possible adverse reactions/side effects for all the Medicines you take and that have been prescribed to you. Take any new Medicines after you have completely understood and accpet all the possible adverse reactions/side effects.   Do not drive when taking Pain medications or sleeping medications (Benzodaizepines)  Do not take more than prescribed Pain, Sleep and Anxiety Medications. It is not advisable to combine anxiety,sleep and pain medications without talking with your primary care practitioner  Special Instructions: If you have smoked or chewed Tobacco  in the last 2 yrs please stop smoking, stop any regular Alcohol  and or any Recreational drug use.  Wear Seat belts while driving.  Please note: You were cared for by a hospitalist during your hospital stay. Once you are discharged, your primary care physician will handle any further medical issues. Please note that NO REFILLS for any discharge medications will be authorized once you are discharged, as it is imperative that you return to your primary care physician (or establish a relationship with a primary care physician if you do not have one) for your post hospital discharge needs so that they can reassess your need for medications and monitor your lab values.   Increase activity slowly   Complete by: As directed       Allergies as of 04/10/2023       Reactions   Anesthetics, Amide Other (See Comments)   Patient unsure of names, however multiples cause swelling of airway & nausea   Clindamycin Swelling   Shellfish Allergy Other (See Comments)   Neurotoxic reaction   Sulfonamide Derivatives Anaphylaxis, Swelling   Neomycin Swelling   Zolpidem Tartrate Other (See Comments)   Jerking motions    Azithromycin Other (See Comments)   Severe gastritis    Bactroban Other (See Comments)   Causes sores in nose   Ciprofloxacin    joint swelling   Codeine Swelling   Meperidine Hcl Nausea Only    Hallucinations   Morphine Nausea Only   Hallucinations    Neosporin [neomycin-polymyxin-gramicidin] Hives, Dermatitis   All topical "orin's ointment"   Nitrofurantoin    neuropathy in legs   Penicillins Other (See Comments)   Swelling in joints   Percocet [oxycodone-acetaminophen]    Dizziness    Tramadol    Makes jerk   Latex Rash        Medication List     TAKE these medications    acetaminophen 500 MG tablet Commonly known as: TYLENOL Take 1,000 mg by mouth 2 (two) times daily as needed for moderate pain.   Align 4 MG Caps Take 4 mg by mouth at bedtime.   ALPRAZolam 0.25 MG tablet Commonly known as: XANAX Take 0.25 mg by mouth daily as needed for anxiety.   aspirin EC 81 MG tablet Take 1 tablet (81 mg total) by mouth daily.   budesonide 3 MG 24 hr capsule Commonly known as: ENTOCORT EC Take 3 mg by mouth daily.   CALTRATE 600+D PO Take 1 tablet by mouth daily.   cetirizine 10 MG tablet Commonly known as: ZYRTEC Take 10 mg by mouth 2 (two) times  daily.   cholecalciferol 1000 units tablet Commonly known as: VITAMIN D Take 1,000 Units by mouth daily.   cyanocobalamin 500 MCG tablet Commonly known as: VITAMIN B12 Take 500 mcg by mouth daily.   diclofenac Sodium 1 % Gel Commonly known as: VOLTAREN Apply 1 application  topically daily as needed (pain).   DULoxetine 60 MG capsule Commonly known as: CYMBALTA Take 60 mg by mouth daily.   estradiol 2 MG vaginal ring Commonly known as: ESTRING Place 2 mg vaginally every 3 (three) months. follow package directions   fludrocortisone 0.1 MG tablet Commonly known as: FLORINEF Take 1 tablet (0.1 mg total) by mouth daily.   folic acid 400 MCG tablet Commonly known as: FOLVITE Take 400 mcg by mouth daily.   levothyroxine 50 MCG tablet Commonly known as: SYNTHROID Take 50 mcg by mouth 3 (three) times a week. Take 50 mcg daily on Tuesday,Thursday, and Sunday   levothyroxine 75 MCG tablet Commonly  known as: SYNTHROID Take 75 mcg by mouth 4 (four) times a week. Pt takes on Monday,Wednesday,Friday, and Saturday   Magnesium 250 MG Tabs Take 250 mg by mouth daily.   meclizine 25 MG tablet Commonly known as: ANTIVERT Take 25 mg by mouth 3 (three) times daily as needed for dizziness.   Melatonin 5 MG Caps Take 5 mg by mouth at bedtime.   midodrine 10 MG tablet Commonly known as: PROAMATINE Take 1.5 tablets (15 mg total) by mouth 3 (three) times daily.   Omega 3 1200 MG Caps Take 1,200 mg by mouth daily.   potassium chloride 10 MEQ tablet Commonly known as: KLOR-CON M Take 2 tablets (20 mEq total) by mouth daily.   pyridostigmine 60 MG tablet Commonly known as: Mestinon Take 0.5 tablets (30 mg total) by mouth 2 (two) times daily.   Restasis 0.05 % ophthalmic emulsion Generic drug: cycloSPORINE Place 1 drop into both eyes 2 (two) times daily as needed (dry eyes).   rosuvastatin 10 MG tablet Commonly known as: Crestor Take 1 tablet (10 mg total) by mouth daily. What changed: when to take this        Follow-up Information     Geoffry Paradise, MD. Schedule an appointment as soon as possible for a visit in 1 week(s).   Specialty: Internal Medicine Contact information: [address] [place] [address] [phone]                Allergies  Allergen Reactions   Anesthetics, Amide Other (See Comments)    Patient unsure of names, however multiples cause swelling of airway & nausea   Clindamycin Swelling   Shellfish Allergy Other (See Comments)    Neurotoxic reaction   Sulfonamide Derivatives Anaphylaxis and Swelling   Neomycin Swelling   Zolpidem Tartrate Other (See Comments)    Jerking motions    Azithromycin Other (See Comments)    Severe gastritis    Bactroban Other (See Comments)    Causes sores in nose   Ciprofloxacin     joint swelling   Codeine Swelling   Meperidine Hcl Nausea Only    Hallucinations    Morphine Nausea Only     Hallucinations    Neosporin [Neomycin-Polymyxin-Gramicidin] Hives and Dermatitis    All topical "orin's ointment"   Nitrofurantoin     neuropathy in legs   Penicillins Other (See Comments)    Swelling in joints   Percocet [Oxycodone-Acetaminophen]     Dizziness    Tramadol     Makes jerk  Latex Rash     Other Procedures/Studies: MR BRAIN WO CONTRAST  Result Date: 04/10/2023 CLINICAL DATA:  Neuro deficit, acute, stroke suspected EXAM: MRI HEAD WITHOUT CONTRAST TECHNIQUE: Multiplanar, multiecho pulse sequences of the brain and surrounding structures were obtained without intravenous contrast. COMPARISON:  CT head April 09, 2023. FINDINGS: Brain: No acute infarction, hemorrhage, hydrocephalus, extra-axial collection or mass lesion. Patchy white matter T2/FLAIR hyperintensities are compatible with chronic microvascular ischemic disease. Cerebral atrophy. Vascular: Major arterial flow voids are maintained at the skull base. Skull and upper cervical spine: Normal marrow signal. Sinuses/Orbits: Clear sinuses.  No acute orbital findings. Other: No mastoid effusions. IMPRESSION: No evidence of acute intracranial abnormality. Electronically Signed   By: Feliberto Harts M.D.   On: 04/10/2023 13:15   CT ANGIO HEAD NECK W WO CM (CODE STROKE)  Result Date: 04/09/2023 CLINICAL DATA:  Initial evaluation for neuro deficit, stroke suspected. EXAM: CT ANGIOGRAPHY HEAD AND NECK WITH AND WITHOUT CONTRAST TECHNIQUE: Multidetector CT imaging of the head and neck was performed using the standard protocol during bolus administration of intravenous contrast. Multiplanar CT image reconstructions and MIPs were obtained to evaluate the vascular anatomy. Carotid stenosis measurements (when applicable) are obtained utilizing NASCET criteria, using the distal internal carotid diameter as the denominator. RADIATION DOSE REDUCTION: This exam was performed according to the departmental dose-optimization program which  includes automated exposure control, adjustment of the mA and/or kV according to patient size and/or use of iterative reconstruction technique. CONTRAST:  75mL OMNIPAQUE IOHEXOL 350 MG/ML SOLN COMPARISON:  CT from earlier the same day. FINDINGS: CTA NECK FINDINGS Aortic arch: Examination degraded by motion artifact. Aortic arch within normal limits for caliber with stated branch pattern. Mild aortic atherosclerosis. No stenosis. Right carotid system: Right common and internal carotid arteries are patent without stenosis or dissection. Left carotid system: Left common and internal carotid arteries are patent without stenosis or dissection. Vertebral arteries: Both vertebral arteries arise from subclavian arteries. Vertebral arteries patent without stenosis or dissection. Skeleton: No discrete or worrisome osseous lesions. Prior ACDF at C5-C7. Mild chronic compression deformity noted at the superior endplate of T3. Other neck: No other acute finding. Upper chest: Left-sided pacemaker/AICD noted. Mild interlobular septal thickening noted, likely mild pulmonary interstitial congestion. No other acute finding. Review of the MIP images confirms the above findings CTA HEAD FINDINGS Anterior circulation: Both internal carotid arteries are patent through the siphons without significant stenosis. A1 segments, anterior communicating artery complex common anterior cerebral arteries patent without stenosis. No M1 stenosis or occlusion. Distal MCA branches perfused and symmetric. Posterior circulation: Both V4 segments widely patent. Left PICA patent. Right PICA origin not well seen. Basilar patent without stenosis. Superior cerebellar and posterior cerebral arteries patent bilaterally. Venous sinuses: Not well assessed due to timing of the contrast bolus. Anatomic variants: None significant.  No aneurysm. Review of the MIP images confirms the above findings IMPRESSION: 1. Negative CTA of the head and neck. No large vessel  occlusion or other emergent finding. 2.  Aortic Atherosclerosis (ICD10-I70.0). Electronically Signed   By: Rise Mu M.D.   On: 04/09/2023 21:57   CT HEAD CODE STROKE WO CONTRAST  Result Date: 04/09/2023 CLINICAL DATA:  Code stroke. Initial evaluation for neuro deficit, stroke suspected. EXAM: CT HEAD WITHOUT CONTRAST TECHNIQUE: Contiguous axial images were obtained from the base of the skull through the vertex without intravenous contrast. RADIATION DOSE REDUCTION: This exam was performed according to the departmental dose-optimization program which includes automated exposure control, adjustment of the  mA and/or kV according to patient size and/or use of iterative reconstruction technique. COMPARISON:  Prior study from 04/01/2023 FINDINGS: Brain: Generalized age-related cerebral atrophy with moderate chronic microvascular ischemic disease. No acute intracranial hemorrhage. No acute large vessel territory infarct. No mass lesion or midline shift. No hydrocephalus or extra-axial fluid collection. Vascular: No abnormal hyperdense vessel. Calcified atherosclerosis present at the skull base. Skull: Scalp soft tissues and calvarium within normal limits. Sinuses/Orbits: Globes orbital soft tissues within normal limits. Paranasal sinuses are clear. No mastoid effusion. Other: None. ASPECTS [facility] Stroke Program Early CT Score) - Ganglionic level infarction (caudate, lentiform nuclei, internal capsule, insula, M1-M3 cortex): 7 - Supraganglionic infarction (M4-M6 cortex): 3 Total score (0-10 with 10 being normal): 10 IMPRESSION: 1. No acute intracranial abnormality. 2. ASPECTS is 10. 3. Atrophy with moderate chronic microvascular ischemic disease. These results were communicated to Dr. Iver Nestle at 8:47 pm on 04/09/2023 by text page via the [facility] messaging system. Electronically Signed   By: Rise Mu M.D.   On: 04/09/2023 20:48   CT HEAD WO CONTRAST ( )  Result Date: 04/01/2023 CLINICAL  DATA:  Syncopal episodes over the last 2 months. EXAM: CT HEAD WITHOUT CONTRAST TECHNIQUE: Contiguous axial images were obtained from the base of the skull through the vertex without intravenous contrast. RADIATION DOSE REDUCTION: This exam was performed according to the departmental dose-optimization program which includes automated exposure control, adjustment of the mA and/or kV according to patient size and/or use of iterative reconstruction technique. COMPARISON:  12/26/2020 FINDINGS: Brain: Age related volume loss. Chronic small-vessel ischemic changes of the cerebral hemispheric white matter. No sign of acute infarction, mass lesion, hemorrhage, hydrocephalus or extra-axial collection. The findings are progressive since 2022. Vascular: Minor vascular calcification at the base of the brain. Skull: Negative Sinuses/Orbits: Clear/normal Other: Previous partial mastoidectomy on the right. IMPRESSION: No acute CT finding. Age related volume loss. Chronic small-vessel ischemic changes of the cerebral hemispheric white matter, progressive since 2022. Electronically Signed   By: Paulina Fusi M.D.   On: 04/01/2023 14:56   CUP PACEART INCLINIC DEVICE CHECK  Result Date: 03/13/2023 Wound check appointment. Dermabond removed. Wound without redness or edema. Incision edges approximated, wound well healed. Normal device function. Thresholds, sensing, and impedances consistent with implant measurements. Device programmed at chronic outputs post gen change. Histogram distribution appropriate for patient and level of activity. No mode switches or high ventricular rates noted. Patient educated on no arm mobility or lifting restrictions. ROV in 3 months with implanting physician.Jeannine Kitten, RN    TODAY-DAY OF DISCHARGE:  Subjective:   [name] today has no headache,no chest abdominal pain,no new weakness tingling or numbness, feels much better wants to go home today.  Objective:   Blood pressure  118/73, pulse 77, temperature 98.1 F (36.7 C), temperature source Oral, resp. rate 17, height 5\' 7"  (1.702 m), weight 71.8 kg, SpO2 94%. No intake or output data in the 24 hours ending 04/10/23 1401 Filed Weights   04/09/23 2000  Weight: 71.8 kg    Exam: Awake Alert, Oriented *3, No new F.N deficits, Normal affect .AT,PERRAL Supple Neck,No JVD, No cervical lymphadenopathy appriciated.  Symmetrical Chest wall movement, Good air movement bilaterally, CTAB RRR,No Gallops,Rubs or new Murmurs, No Parasternal Heave +ve B.Sounds, Abd Soft, Non tender, No organomegaly appriciated, No rebound -guarding or rigidity. No Cyanosis, Clubbing or edema, No new Rash or bruise   PERTINENT RADIOLOGIC STUDIES: MR BRAIN WO CONTRAST  Result Date: 04/10/2023 CLINICAL DATA:  Neuro deficit, acute,  stroke suspected EXAM: MRI HEAD WITHOUT CONTRAST TECHNIQUE: Multiplanar, multiecho pulse sequences of the brain and surrounding structures were obtained without intravenous contrast. COMPARISON:  CT head April 09, 2023. FINDINGS: Brain: No acute infarction, hemorrhage, hydrocephalus, extra-axial collection or mass lesion. Patchy white matter T2/FLAIR hyperintensities are compatible with chronic microvascular ischemic disease. Cerebral atrophy. Vascular: Major arterial flow voids are maintained at the skull base. Skull and upper cervical spine: Normal marrow signal. Sinuses/Orbits: Clear sinuses.  No acute orbital findings. Other: No mastoid effusions. IMPRESSION: No evidence of acute intracranial abnormality. Electronically Signed   By: Feliberto Harts M.D.   On: 04/10/2023 13:15   CT ANGIO HEAD NECK W WO CM (CODE STROKE)  Result Date: 04/09/2023 CLINICAL DATA:  Initial evaluation for neuro deficit, stroke suspected. EXAM: CT ANGIOGRAPHY HEAD AND NECK WITH AND WITHOUT CONTRAST TECHNIQUE: Multidetector CT imaging of the head and neck was performed using the standard protocol during bolus administration of  intravenous contrast. Multiplanar CT image reconstructions and MIPs were obtained to evaluate the vascular anatomy. Carotid stenosis measurements (when applicable) are obtained utilizing NASCET criteria, using the distal internal carotid diameter as the denominator. RADIATION DOSE REDUCTION: This exam was performed according to the departmental dose-optimization program which includes automated exposure control, adjustment of the mA and/or kV according to patient size and/or use of iterative reconstruction technique. CONTRAST:  75mL OMNIPAQUE IOHEXOL 350 MG/ML SOLN COMPARISON:  CT from earlier the same day. FINDINGS: CTA NECK FINDINGS Aortic arch: Examination degraded by motion artifact. Aortic arch within normal limits for caliber with stated branch pattern. Mild aortic atherosclerosis. No stenosis. Right carotid system: Right common and internal carotid arteries are patent without stenosis or dissection. Left carotid system: Left common and internal carotid arteries are patent without stenosis or dissection. Vertebral arteries: Both vertebral arteries arise from subclavian arteries. Vertebral arteries patent without stenosis or dissection. Skeleton: No discrete or worrisome osseous lesions. Prior ACDF at C5-C7. Mild chronic compression deformity noted at the superior endplate of T3. Other neck: No other acute finding. Upper chest: Left-sided pacemaker/AICD noted. Mild interlobular septal thickening noted, likely mild pulmonary interstitial congestion. No other acute finding. Review of the MIP images confirms the above findings CTA HEAD FINDINGS Anterior circulation: Both internal carotid arteries are patent through the siphons without significant stenosis. A1 segments, anterior communicating artery complex common anterior cerebral arteries patent without stenosis. No M1 stenosis or occlusion. Distal MCA branches perfused and symmetric. Posterior circulation: Both V4 segments widely patent. Left PICA patent. Right  PICA origin not well seen. Basilar patent without stenosis. Superior cerebellar and posterior cerebral arteries patent bilaterally. Venous sinuses: Not well assessed due to timing of the contrast bolus. Anatomic variants: None significant.  No aneurysm. Review of the MIP images confirms the above findings IMPRESSION: 1. Negative CTA of the head and neck. No large vessel occlusion or other emergent finding. 2.  Aortic Atherosclerosis (ICD10-I70.0). Electronically Signed   By: Rise Mu M.D.   On: 04/09/2023 21:57   CT HEAD CODE STROKE WO CONTRAST  Result Date: 04/09/2023 CLINICAL DATA:  Code stroke. Initial evaluation for neuro deficit, stroke suspected. EXAM: CT HEAD WITHOUT CONTRAST TECHNIQUE: Contiguous axial images were obtained from the base of the skull through the vertex without intravenous contrast. RADIATION DOSE REDUCTION: This exam was performed according to the departmental dose-optimization program which includes automated exposure control, adjustment of the mA and/or kV according to patient size and/or use of iterative reconstruction technique. COMPARISON:  Prior study from 04/01/2023 FINDINGS: Brain: Generalized  age-related cerebral atrophy with moderate chronic microvascular ischemic disease. No acute intracranial hemorrhage. No acute large vessel territory infarct. No mass lesion or midline shift. No hydrocephalus or extra-axial fluid collection. Vascular: No abnormal hyperdense vessel. Calcified atherosclerosis present at the skull base. Skull: Scalp soft tissues and calvarium within normal limits. Sinuses/Orbits: Globes orbital soft tissues within normal limits. Paranasal sinuses are clear. No mastoid effusion. Other: None. ASPECTS [facility] Stroke Program Early CT Score) - Ganglionic level infarction (caudate, lentiform nuclei, internal capsule, insula, M1-M3 cortex): 7 - Supraganglionic infarction (M4-M6 cortex): 3 Total score (0-10 with 10 being normal): 10 IMPRESSION: 1. No acute  intracranial abnormality. 2. ASPECTS is 10. 3. Atrophy with moderate chronic microvascular ischemic disease. These results were communicated to Dr. Iver Nestle at 8:47 pm on 04/09/2023 by text page via the [facility] messaging system. Electronically Signed   By: Rise Mu M.D.   On: 04/09/2023 20:48     PERTINENT LAB RESULTS: CBC: Recent Labs    04/09/23 2034 04/10/23 0421  WBC 8.8 8.9  HGB 13.8 12.7  HCT 40.2 37.4  PLT 230 215   CMET CMP     Component Value Date/Time   NA 133 (L) 04/09/2023 2034   NA 143 02/11/2023 1140   K 3.2 (L) 04/09/2023 2034   CL 101 04/09/2023 2034   CO2 24 04/09/2023 2034   GLUCOSE 107 (H) 04/09/2023 2034   BUN 17 04/09/2023 2034   BUN 9 02/11/2023 1140   CREATININE 0.78 04/09/2023 2034   CALCIUM 9.3 04/09/2023 2034   PROT 7.3 04/09/2023 2034   PROT 6.8 12/09/2019 1117   ALBUMIN 4.1 04/09/2023 2034   ALBUMIN 4.4 12/09/2019 1117   AST 27 04/09/2023 2034   ALT 18 04/09/2023 2034   ALKPHOS 43 04/09/2023 2034   BILITOT 0.8 04/09/2023 2034   BILITOT 0.5 12/09/2019 1117   GFR 103.51 01/27/2013 1545   EGFR 80 02/11/2023 1140   GFRNONAA >60 04/09/2023 2034    GFR Estimated Creatinine Clearance: 56.4 mL/min (by C-G formula based on SCr of 0.78 mg/dL). No results for input(s): "LIPASE", "AMYLASE" in the last 72 hours. No results for input(s): "CKTOTAL", "CKMB", "CKMBINDEX", "TROPONINI" in the last 72 hours. Invalid input(s): "POCBNP" No results for input(s): "DDIMER" in the last 72 hours. Recent Labs    04/10/23 0421  HGBA1C 5.7*   Recent Labs    04/10/23 0421  CHOL 111  HDL 46  LDLCALC 53  TRIG 60  CHOLHDL 2.4   No results for input(s): "TSH", "T4TOTAL", "T3FREE", "THYROIDAB" in the last 72 hours.  Invalid input(s): "FREET3" No results for input(s): "VITAMINB12", "FOLATE", "FERRITIN", "TIBC", "IRON", "RETICCTPCT" in the last 72 hours. Coags: Recent Labs    04/09/23 2034  INR 1.0   Microbiology: No results found for this or any  previous visit (from the past 240 hour(s)).  FURTHER DISCHARGE INSTRUCTIONS:  Get Medicines reviewed and adjusted: Please take all your medications with you for your next visit with your Primary MD  Laboratory/radiological data: Please request your Primary MD to go over all hospital tests and procedure/radiological results at the follow up, please ask your Primary MD to get all Hospital records sent to his/her office.  In some cases, they will be blood work, cultures and biopsy results pending at the time of your discharge. Please request that your primary care M.D. goes through all the records of your hospital data and follows up on these results.  Also Note the following: If you experience worsening of your  admission symptoms, develop shortness of breath, life threatening emergency, suicidal or homicidal thoughts you must seek medical attention immediately by calling 911 or calling your MD immediately  if symptoms less severe.  You must read complete instructions/literature along with all the possible adverse reactions/side effects for all the Medicines you take and that have been prescribed to you. Take any new Medicines after you have completely understood and accpet all the possible adverse reactions/side effects.   Do not drive when taking Pain medications or sleeping medications (Benzodaizepines)  Do not take more than prescribed Pain, Sleep and Anxiety Medications. It is not advisable to combine anxiety,sleep and pain medications without talking with your primary care practitioner  Special Instructions: If you have smoked or chewed Tobacco  in the last 2 yrs please stop smoking, stop any regular Alcohol  and or any Recreational drug use.  Wear Seat belts while driving.  Please note: You were cared for by a hospitalist during your hospital stay. Once you are discharged, your primary care physician will handle any further medical issues. Please note that NO REFILLS for any discharge  medications will be authorized once you are discharged, as it is imperative that you return to your primary care physician (or establish a relationship with a primary care physician if you do not have one) for your post hospital discharge needs so that they can reassess your need for medications and monitor your lab values.  Total Time spent coordinating discharge including counseling, education and face to face time equals greater than 30 minutes.  SignedJeoffrey Massed 04/10/2023 2:01 PM

## 2023-04-10 NOTE — Progress Notes (Signed)
Patient has bee provided discharge instructions to include medications and follow up appointments.

## 2023-04-10 NOTE — H&P (Incomplete)
History and Physical    [name] OZD:[id number] DOB: March 13, 1946 DOA: 04/09/2023  PCP: Geoffry Paradise, MD   Patient coming from: ***  Chief Complaint: ***  HPI: [name] is a pleasant 77 y.o. female with medical history significant for ***   ED Course: Upon arrival to the ED, patient is found to be ***  Review of Systems:  All other systems reviewed and apart from HPI, are negative.  Past Medical History:  Diagnosis Date  . ADHD (attention deficit hyperactivity disorder)   . Allergy    trees/pollen, mold, fungus, dust mites. Takes allergy shots  . Arthritis    PAIN AND OA LEFT HIP  . Asthma    allergist Dr Illene Labrador- monthly allergy injections  . Autonomic dysfunction    CENTRAL NERVOUS SYSTEM NEUROPATHY - DX BY DR. Sandria Manly MORE THAN 10 YRS AGO - AND IT IS FELT TO CONTRIBUTE TO THE AUTOMIC  DYSFUNCTION-- PT HAS NUMBNESS LEGS AND FEET AND SOMETIIMES TIPS OF FINGER, SEVERE CONSTIPATION( NO SENSATION TO HAVE BM ), DOUBLE VISION, ORTHOSTATIC HYPOTENSION  . Blood transfusion   . Breast cancer (HCC)    right side  . Bruises easily   . Cataract   . Chest pain    a. 12/2012 Cath: nl cors, EF 55-65%.  . Complication of anesthesia    reaction to some anesthetics/ 7/12 anesth record on chart- states prefers epidural  . CVA (cerebrovascular accident) (HCC) 03/21/2018  . Depression   . Diverticulosis   . Dysrhythmia    HX OF HIGH GRADE HEART BLOCK - REQUIRED PACEMAKER INSERTION  . Esophageal stricture   . Gastritis   . GERD (gastroesophageal reflux disease)   . H/O hiatal hernia   . Hemorrhoids   . Hernia of abdominal wall    spigelian hernia RLQ - SURGERY TO REPAIR  . Hypothyroidism   . Interstitial cystitis   . Latex allergy, contact dermatitis   . Mallory - Weiss tear    HEALED   . Neuromuscular disorder (HCC)    central nervous system neuropathy- seen per Dr Sandria Manly  . Pacemaker   . Pericardial effusion    a. 12/2012 following ppm placement;  b.  01/01/2013 Echo: EF 55-60%, small pericardial effusion w/o RV collapse-->No need for tap/window.  Marland Kitchen PONV (postoperative nausea and vomiting)    pt needs scop patch  . Recurrent upper respiratory infection (URI) 1/13- to present   bronchitis following surgery- states improved but still with cough. OV with Clearance Dr [name] 09/06/11 on chart  . Shortness of breath    AT TIMES - BUT MUCH IMPROVED AFTER PACEMAKER WAS REPROGRAMED.  Marland Kitchen Symptomatic bradycardia    a. 12/2012 s/p MDT dual chamber PPM, ser # [id number]; b. 12/2012 post-op course complicated by pericardial effusinon req lead revision.  . Thyroid disease     Past Surgical History:  Procedure Laterality Date  . ABDOMINAL HYSTERECTOMY  1974  . BACK SURGERY     cervical fusion 4-5 with plate  . BLADDER SUSPENSION    . BREAST BIOPSY  2002   NO BLOOD PRESSURES ON RIGHT SIDE/   s/p  axillary node dissection  . CYSTO WITH HYDRODISTENSION  09/07/2011   Procedure: CYSTOSCOPY/HYDRODISTENSION;  Surgeon: Kathi Ludwig, MD;  Location: [facility];  Service: Urology;  Laterality: N/A;  INSTILLATION OF MARCAINE/PYRIDIUM INSTILLATION OF MARCAINE/KENALOG  . CYSTOSCOPY  1975, 2007  . EYE SURGERY     LASIK EYE SURGERY BILATERAL  . LAPAROSCOPY  1973  . LEAD  REVISION N/A 12/30/2012   Procedure: LEAD REVISION;  Surgeon: Marinus Maw, MD;  Location: [facility] CATH LAB;  Service: Cardiovascular;  Laterality: N/A;  . LEFT HEART CATHETERIZATION WITH CORONARY ANGIOGRAM N/A 12/29/2012   Procedure: LEFT HEART CATHETERIZATION WITH CORONARY ANGIOGRAM;  Surgeon: Peter M Swaziland, MD;  Location: [facility] CATH LAB;  Service: Cardiovascular;  Laterality: N/A;  . MASTECTOMY MODIFIED RADICAL     right; with immediate reconstruction  . MYRINGOPLASTY  1962  . NECK SURGERY     c4-5 ruptured disc  . OOPHORECTOMY  1982  . PACEMAKER INSERTION    . PERMANENT PACEMAKER INSERTION N/A 12/29/2012   Procedure: PERMANENT PACEMAKER INSERTION;  Surgeon: Duke Salvia, MD;  Location: [facility]  CATH LAB;  Service: Cardiovascular;  Laterality: N/A;  . PPM GENERATOR CHANGEOUT N/A 02/27/2023   Procedure: PPM GENERATOR CHANGEOUT;  Surgeon: Duke Salvia, MD;  Location: [facility] INVASIVE CV LAB;  Service: Cardiovascular;  Laterality: N/A;  . RECTOCELE REPAIR     with cystocele repair  . TONSILLECTOMY    . TOTAL HIP ARTHROPLASTY Right   . TOTAL HIP ARTHROPLASTY Left 06/12/2013   Procedure: LEFT TOTAL HIP ARTHROPLASTY ANTERIOR APPROACH;  Surgeon: Kathryne Hitch, MD;  Location: [facility];  Service: Orthopedics;  Laterality: Left;  . TYMPANOPLASTY  1973   right  . ULNAR NERVE TRANSPOSITION Right   . VENTRAL HERNIA REPAIR  05/30/2011   Procedure: HERNIA REPAIR VENTRAL ADULT;  Surgeon: Currie Paris, MD;  Location: [facility];  Service: General;  Laterality: Right;  repair right spigelian hernia    Social History:   reports that she has never smoked. She has never used smokeless tobacco. She reports current alcohol use of about 7.0 standard drinks of alcohol per week. She reports that she does not use drugs.  Allergies  Allergen Reactions  . Anesthetics, Amide Other (See Comments)    Patient unsure of names, however multiples cause swelling of airway & nausea  . Clindamycin Swelling  . Shellfish Allergy Other (See Comments)    Neurotoxic reaction  . Sulfonamide Derivatives Anaphylaxis and Swelling  . Neomycin Swelling  . Zolpidem Tartrate Other (See Comments)    Jerking motions   . Azithromycin Other (See Comments)    Severe gastritis   . Bactroban Other (See Comments)    Causes sores in nose  . Ciprofloxacin     joint swelling  . Codeine Swelling  . Meperidine Hcl Nausea Only    Hallucinations   . Morphine Nausea Only    Hallucinations   . Neosporin [Neomycin-Polymyxin-Gramicidin] Hives and Dermatitis    All topical "orin's ointment"  . Nitrofurantoin     neuropathy in legs  . Penicillins Other (See Comments)    Swelling in joints  . Percocet  [Oxycodone-Acetaminophen]     Dizziness   . Tramadol     Makes jerk  . Latex Rash    Family History  Problem Relation Age of Onset  . Heart disease Father   . Colon cancer Mother 51  . Depression Mother   . Pancreatic cancer Sister 60  . Diverticulitis Sister   . Uterine cancer Maternal Grandmother   . Heart disease Brother   . Heart disease Paternal Grandmother   . Heart disease Paternal Grandfather   . Throat cancer Maternal Uncle   . Heart disease Paternal Aunt   . Heart disease Paternal Uncle   . Heart disease Brother   . Thrombosis Maternal Aunt   . Cancer Maternal  Uncle        NOS  . Diabetes Other        aunt     Prior to Admission medications   Medication Sig Start Date End Date Taking? Authorizing Provider  acetaminophen (TYLENOL) 500 MG tablet Take 1,000 mg by mouth 2 (two) times daily as needed for moderate pain.   Yes [provider]  ALPRAZolam (XANAX) 0.25 MG tablet Take 0.25 mg by mouth daily as needed for anxiety. 01/23/23  Yes [provider]  aspirin EC 81 MG EC tablet Take 1 tablet (81 mg total) by mouth daily. 03/25/18  Yes Layne Benton, NP  budesonide (ENTOCORT EC) 3 MG 24 hr capsule Take 3 mg by mouth daily.   Yes [provider]  Calcium Carbonate-Vitamin D (CALTRATE 600+D PO) Take 1 tablet by mouth daily.   Yes [provider]  cetirizine (ZYRTEC) 10 MG tablet Take 10 mg by mouth 2 (two) times daily.   Yes [provider]  cholecalciferol (VITAMIN D) 1000 UNITS tablet Take 1,000 Units by mouth daily.   Yes [provider]  cycloSPORINE (RESTASIS) 0.05 % ophthalmic emulsion Place 1 drop into both eyes 2 (two) times daily as needed (dry eyes). 12/13/20  Yes [provider]  diclofenac Sodium (VOLTAREN) 1 % GEL Apply 1 application  topically daily as needed (pain).   Yes [provider]  DULoxetine (CYMBALTA) 60 MG capsule Take 60 mg by mouth daily.   Yes [provider]   estradiol (ESTRING) 2 MG vaginal ring Place 2 mg vaginally every 3 (three) months. follow package directions   Yes [provider]  fludrocortisone (FLORINEF) 0.1 MG tablet Take 1 tablet (0.1 mg total) by mouth daily. 02/27/23  Yes Duke Salvia, MD  folic acid (FOLVITE) 400 MCG tablet Take 400 mcg by mouth daily.   Yes [provider]  levothyroxine (SYNTHROID) 50 MCG tablet Take 50 mcg by mouth 3 (three) times a week. Take 50 mcg daily on Tuesday,Thursday, and Sunday   Yes [provider]  levothyroxine (SYNTHROID) 75 MCG tablet Take 75 mcg by mouth 4 (four) times a week. Pt takes on Monday,Wednesday,Friday, and Saturday   Yes [provider]  Magnesium 250 MG TABS Take 250 mg by mouth daily.   Yes [provider]  meclizine (ANTIVERT) 25 MG tablet Take 25 mg by mouth 3 (three) times daily as needed for dizziness.    Yes [provider]  Melatonin 5 MG CAPS Take 5 mg by mouth at bedtime.   Yes [provider]  midodrine (PROAMATINE) 10 MG tablet Take 1.5 tablets (15 mg total) by mouth 3 (three) times daily. 03/26/23  Yes Duke Salvia, MD  Omega 3 1200 MG CAPS Take 1,200 mg by mouth daily.   Yes [provider]  potassium chloride (KLOR-CON M) 10 MEQ tablet Take 2 tablets (20 mEq total) by mouth daily. 01/21/23  Yes Graciella Freer, PA-C  Probiotic Product (ALIGN) 4 MG CAPS Take 4 mg by mouth at bedtime.    Yes [provider]  pyridostigmine (MESTINON) 60 MG tablet Take 0.5 tablets (30 mg total) by mouth 2 (two) times daily. 03/19/23  Yes Duke Salvia, MD  rosuvastatin (CRESTOR) 10 MG tablet Take 1 tablet (10 mg total) by mouth daily. Patient taking differently: Take 10 mg by mouth at bedtime. 07/03/18  Yes McCue, Shanda Bumps, NP  vitamin B-12 (CYANOCOBALAMIN) 500 MCG tablet Take 500 mcg by mouth daily.  Yes [provider]    Physical Exam: Vitals:   04/09/23 2145 04/09/23 2200 04/09/23 2215  04/09/23 2230  BP:   (!) 116/99   Pulse:  77 84 74  Resp: (!) 21 (!) 21 19 16   Temp:      TempSrc:      SpO2:  100% 99% 100%  Weight:         Constitutional: NAD, calm  Eyes: PERTLA, lids and conjunctivae normal ENMT: Mucous membranes are moist. Posterior pharynx clear of any exudate or lesions.   Neck: supple, no masses  Respiratory: clear to auscultation bilaterally, no wheezing, no crackles. No accessory muscle use.  Cardiovascular: S1 & S2 heard, regular rate and rhythm. No extremity edema. No significant JVD. Abdomen: No distension, no tenderness, soft. Bowel sounds active.  Musculoskeletal: no clubbing / cyanosis. No joint deformity upper and lower extremities.   Skin: no significant rashes, lesions, ulcers. Warm, dry, well-perfused. Neurologic: CN 2-12 grossly intact. Sensation intact, DTR normal. Strength 5/5 in all 4 limbs. Alert and oriented.  Psychiatric: Pleasant. Cooperative.    Labs and Imaging on Admission: I have personally reviewed following labs and imaging studies  CBC: Recent Labs  Lab 04/09/23 2033 04/09/23 2034  WBC  --  8.8  NEUTROABS  --  4.7  HGB 13.3 13.8  HCT 39.0 40.2  MCV  --  92.2  PLT  --  230   Basic Metabolic Panel: Recent Labs  Lab 04/09/23 2033 04/09/23 2034  NA 140 133*  K 3.2* 3.2*  CL 103 101  CO2  --  24  GLUCOSE 102* 107*  BUN 19 17  CREATININE 0.80 0.78  CALCIUM  --  9.3   GFR: Estimated Creatinine Clearance: 56.4 mL/min (by C-G formula based on SCr of 0.78 mg/dL). Liver Function Tests: Recent Labs  Lab 04/09/23 2034  AST 27  ALT 18  ALKPHOS 43  BILITOT 0.8  PROT 7.3  ALBUMIN 4.1   No results for input(s): "LIPASE", "AMYLASE" in the last 168 hours. No results for input(s): "AMMONIA" in the last 168 hours. Coagulation Profile: Recent Labs  Lab 04/09/23 2034  INR 1.0   Cardiac Enzymes: No results for input(s): "CKTOTAL", "CKMB", "CKMBINDEX", "TROPONINI" in the last 168 hours. BNP (last 3 results) No  results for input(s): "PROBNP" in the last 8760 hours. HbA1C: No results for input(s): "HGBA1C" in the last 72 hours. CBG: Recent Labs  Lab 04/09/23 2029  GLUCAP 98   Lipid Profile: No results for input(s): "CHOL", "HDL", "LDLCALC", "TRIG", "CHOLHDL", "LDLDIRECT" in the last 72 hours. Thyroid Function Tests: No results for input(s): "TSH", "T4TOTAL", "FREET4", "T3FREE", "THYROIDAB" in the last 72 hours. Anemia Panel: No results for input(s): "VITAMINB12", "FOLATE", "FERRITIN", "TIBC", "IRON", "RETICCTPCT" in the last 72 hours. Urine analysis:    Component Value Date/Time   COLORURINE YELLOW 04/09/2023 2150   APPEARANCEUR HAZY (A) 04/09/2023 2150   LABSPEC 1.030 04/09/2023 2150   PHURINE 7.0 04/09/2023 2150   GLUCOSEU 50 (A) 04/09/2023 2150   HGBUR NEGATIVE 04/09/2023 2150   BILIRUBINUR NEGATIVE 04/09/2023 2150   KETONESUR NEGATIVE 04/09/2023 2150   PROTEINUR NEGATIVE 04/09/2023 2150   UROBILINOGEN 0.2 10/13/2014 1640   NITRITE NEGATIVE 04/09/2023 2150   LEUKOCYTESUR NEGATIVE 04/09/2023 2150   Sepsis Labs: @LABRCNTIP (procalcitonin:4,lacticidven:4) )No results found for this or any previous visit (from the past 240 hour(s)).   Radiological Exams on Admission: CT ANGIO HEAD NECK W WO CM (CODE STROKE)  Result Date: 04/09/2023 CLINICAL DATA:  Initial  evaluation for neuro deficit, stroke suspected. EXAM: CT ANGIOGRAPHY HEAD AND NECK WITH AND WITHOUT CONTRAST TECHNIQUE: Multidetector CT imaging of the head and neck was performed using the standard protocol during bolus administration of intravenous contrast. Multiplanar CT image reconstructions and MIPs were obtained to evaluate the vascular anatomy. Carotid stenosis measurements (when applicable) are obtained utilizing NASCET criteria, using the distal internal carotid diameter as the denominator. RADIATION DOSE REDUCTION: This exam was performed according to the departmental dose-optimization program which includes automated  exposure control, adjustment of the mA and/or kV according to patient size and/or use of iterative reconstruction technique. CONTRAST:  75mL OMNIPAQUE IOHEXOL 350 MG/ML SOLN COMPARISON:  CT from earlier the same day. FINDINGS: CTA NECK FINDINGS Aortic arch: Examination degraded by motion artifact. Aortic arch within normal limits for caliber with stated branch pattern. Mild aortic atherosclerosis. No stenosis. Right carotid system: Right common and internal carotid arteries are patent without stenosis or dissection. Left carotid system: Left common and internal carotid arteries are patent without stenosis or dissection. Vertebral arteries: Both vertebral arteries arise from subclavian arteries. Vertebral arteries patent without stenosis or dissection. Skeleton: No discrete or worrisome osseous lesions. Prior ACDF at C5-C7. Mild chronic compression deformity noted at the superior endplate of T3. Other neck: No other acute finding. Upper chest: Left-sided pacemaker/AICD noted. Mild interlobular septal thickening noted, likely mild pulmonary interstitial congestion. No other acute finding. Review of the MIP images confirms the above findings CTA HEAD FINDINGS Anterior circulation: Both internal carotid arteries are patent through the siphons without significant stenosis. A1 segments, anterior communicating artery complex common anterior cerebral arteries patent without stenosis. No M1 stenosis or occlusion. Distal MCA branches perfused and symmetric. Posterior circulation: Both V4 segments widely patent. Left PICA patent. Right PICA origin not well seen. Basilar patent without stenosis. Superior cerebellar and posterior cerebral arteries patent bilaterally. Venous sinuses: Not well assessed due to timing of the contrast bolus. Anatomic variants: None significant.  No aneurysm. Review of the MIP images confirms the above findings IMPRESSION: 1. Negative CTA of the head and neck. No large vessel occlusion or other  emergent finding. 2.  Aortic Atherosclerosis (ICD10-I70.0). Electronically Signed   By: Rise Mu M.D.   On: 04/09/2023 21:57   CT HEAD CODE STROKE WO CONTRAST  Result Date: 04/09/2023 CLINICAL DATA:  Code stroke. Initial evaluation for neuro deficit, stroke suspected. EXAM: CT HEAD WITHOUT CONTRAST TECHNIQUE: Contiguous axial images were obtained from the base of the skull through the vertex without intravenous contrast. RADIATION DOSE REDUCTION: This exam was performed according to the departmental dose-optimization program which includes automated exposure control, adjustment of the mA and/or kV according to patient size and/or use of iterative reconstruction technique. COMPARISON:  Prior study from 04/01/2023 FINDINGS: Brain: Generalized age-related cerebral atrophy with moderate chronic microvascular ischemic disease. No acute intracranial hemorrhage. No acute large vessel territory infarct. No mass lesion or midline shift. No hydrocephalus or extra-axial fluid collection. Vascular: No abnormal hyperdense vessel. Calcified atherosclerosis present at the skull base. Skull: Scalp soft tissues and calvarium within normal limits. Sinuses/Orbits: Globes orbital soft tissues within normal limits. Paranasal sinuses are clear. No mastoid effusion. Other: None. ASPECTS [facility] Stroke Program Early CT Score) - Ganglionic level infarction (caudate, lentiform nuclei, internal capsule, insula, M1-M3 cortex): 7 - Supraganglionic infarction (M4-M6 cortex): 3 Total score (0-10 with 10 being normal): 10 IMPRESSION: 1. No acute intracranial abnormality. 2. ASPECTS is 10. 3. Atrophy with moderate chronic microvascular ischemic disease. These results were communicated to Dr.  Bhagat at 8:47 pm on 04/09/2023 by text page via the [facility] messaging system. Electronically Signed   By: Rise Mu M.D.   On: 04/09/2023 20:48    EKG: Independently reviewed. ***  Assessment/Plan Principal Problem:   Focal  neurological deficit Active Problems:   Asthma   Hypokalemia   Hypothyroidism   Orthostatic hypotension   Complicated migraine   Depression    1.  -  -  -  -  -  -  -  -   2.  -  -  -  -  -  -  -   3.  -  -  -  -  -  -  -   4.  -  -  -  -  -  -  -  -   5.  -  -  -  -  -  -  -   6.  -  -  -  -  -   7.  -  -  -  -  -  -   8.  -  -  -  -  -  -     DVT prophylaxis: ***  Code Status: ***  Level of Care: Level of care: Telemetry Medical Family Communication: ***  Disposition Plan:  Patient is from: ***  Anticipated d/c is to: *** Anticipated d/c date is: *** Patient currently: ***  Consults called: ***  Admission status: ***    Briscoe Deutscher, MD [facility]  04/10/2023, 12:02 AM

## 2023-04-10 NOTE — Evaluation (Signed)
Physical Therapy Evaluation Patient Details Name: [name] MRN: [id number] DOB: 03/25/1946 Today's Date: 04/10/2023  History of Present Illness  77 yo female presented to [facility] with headache, speech difficulty, dizziness, and right sided weakness. CT scans negative for any acute findings, atrophy with microvascular ischemic disease noted on CT.  PMH significant for headaches, heart block with pacemaker, lumbar radiculopathy, autonomic dysfunction, orthostatic hypotension and MRI negative brainstem stroke (2019)?  Clinical Impression  Pt presents to PT with deficits in functional mobility, activity tolerance, gait, balance. Pt reports a significant chronic history of both vestibular dysfunction along with orthostatic hypotension and autonomic dysfunction. Pt has had multiple recent falls due to syncope and pre-syncope. Pt is orthostatic during this session when standing. PT provides education on compensatory methods to reduce the pt's risk for orthostatic hypotension and falls, including the use of compression stockings, abdominal binder, pre-mobility exercise, and counter-pressure measures. PT also suggests attempting ADLs and most mobility around when the pt's midodrine doses seem to be most effective. Pt will benefit from continued PT services in an effort to improve activity tolerance and reduce falls risk. HHPT is recommended along with a RW and BSC.        If plan is discharge home, recommend the following: A lot of help with walking and/or transfers;A little help with bathing/dressing/bathroom;Assistance with cooking/housework;Assist for transportation;Help with stairs or ramp for entrance   Can travel by private vehicle        Equipment Recommendations Rolling walker (2 wheels);BSC/3in1  Recommendations for Other Services       Functional Status Assessment Patient has had a recent decline in their functional status and demonstrates the ability to make significant improvements  in function in a reasonable and predictable amount of time.     Precautions / Restrictions Precautions Precautions: Fall Precaution Comments: hx of diplopia and vestibular dysfunction Restrictions Weight Bearing Restrictions: No      Mobility  Bed Mobility                    Transfers Overall transfer level: Needs assistance Equipment used: Rolling walker (2 wheels) Transfers: Sit to/from Stand Sit to Stand: Min assist           General transfer comment: verbal cues for hand placement and to increase trunk flexion    Ambulation/Gait Ambulation/Gait assistance:  (deferred due to orthostatic hypotension)                Stairs            Wheelchair Mobility     Tilt Bed    Modified Rankin (Stroke Patients Only)       Balance Overall balance assessment: Needs assistance Sitting-balance support: No upper extremity supported, Feet supported Sitting balance-Leahy Scale: Fair     Standing balance support: Bilateral upper extremity supported, Reliant on assistive device for balance Standing balance-Leahy Scale: Poor                               Pertinent Vitals/Pain Pain Assessment Pain Assessment: No/denies pain    Home Living Family/patient expects to be discharged to:: Private residence Living Arrangements: Spouse/significant other Available Help at Discharge: Family;Available 24 hours/day Type of Home: House Home Access: Stairs to enter   [employer] of Steps: 2 from garage Alternate Level Stairs-Number of Steps: 12 Home Layout: Able to live on main level with bedroom/bathroom;Two level Home Equipment: Shower seat - built in;Rollator (  4 wheels)      Prior Function Prior Level of Function : History of Falls (last six months);Needs assist             Mobility Comments: pt has been ambulating for short household distances with rollator, often times assisted by her spouse when feeling unwell. Has had  multiple syncopal episodes and falls       Extremity/Trunk Assessment   Upper Extremity Assessment Upper Extremity Assessment: Defer to OT evaluation    Lower Extremity Assessment Lower Extremity Assessment: Generalized weakness    Cervical / Trunk Assessment Cervical / Trunk Assessment: Normal  Communication   Communication Communication: No apparent difficulties Cueing Techniques: Verbal cues  Cognition Arousal: Alert Behavior During Therapy: WFL for tasks assessed/performed Overall Cognitive Status: Within Functional Limits for tasks assessed                                          General Comments General comments (skin integrity, edema, etc.): pt with orthostatic hypotension during session, BP 111/75 in sitting and then 76/48 in standing. Recovers to 95/67 later during session when seated    Exercises     Assessment/Plan    PT Assessment Patient needs continued PT services  PT Problem List Decreased strength;Decreased activity tolerance;Decreased balance;Decreased mobility;Cardiopulmonary status limiting activity       PT Treatment Interventions DME instruction;Gait training;Stair training;Functional mobility training;Therapeutic activities;Therapeutic exercise;Balance training;Neuromuscular re-education;Cognitive remediation;Patient/family education    PT Goals (Current goals can be found in the Care Plan section)  Acute Rehab PT Goals Patient Stated Goal: to improve activity tolerance and reduce falls risk PT Goal Formulation: With patient Time For Goal Achievement: 04/24/23 Potential to Achieve Goals: Fair Additional Goals Additional Goal #1: Pt will independently verbalize at least 3 compensatory methods for reducing the risk for orthostatic hypotension and syncope    Frequency Min 1X/week     Co-evaluation               AM-PAC PT "6 Clicks" Mobility  Outcome Measure Help needed turning from your back to your side while in a  flat bed without using bedrails?: A Little Help needed moving from lying on your back to sitting on the side of a flat bed without using bedrails?: A Little Help needed moving to and from a bed to a chair (including a wheelchair)?: A Little Help needed standing up from a chair using your arms (e.g., wheelchair or bedside chair)?: A Little Help needed to walk in hospital room?: A Lot Help needed climbing 3-5 steps with a railing? : Total 6 Click Score: 15    End of Session   Activity Tolerance: Treatment limited secondary to medical complications (Comment) (orthotstatic hypotension) Patient left: in chair;with call bell/phone within reach Nurse Communication: Mobility status PT Visit Diagnosis: Other abnormalities of gait and mobility (R26.89);Muscle weakness (generalized) (M62.81)    Time: 0981-1914 PT Time Calculation (min) (ACUTE ONLY): 60 min   Charges:   PT Evaluation $PT Eval Low Complexity: 1 Low PT Treatments $Self Care/Home Management: 8-22 PT General Charges $$ ACUTE PT VISIT: 1 Visit         Arlyss Gandy, PT, DPT Acute Rehabilitation Office [phone]   Arlyss Gandy 04/10/2023, 3:27 PM

## 2023-04-10 NOTE — Evaluation (Signed)
Speech Language Pathology Evaluation Patient Details Name: [name] MRN: [id number] DOB: [birth year] Today's Date: 04/10/2023 Time: 4098-1191 SLP Time Calculation (min) (ACUTE ONLY): 19 min  Problem List:  Patient Active Problem List   Diagnosis Date Noted   Focal neurological deficit 04/09/2023   Depression    Pain in both lower extremities 01/07/2022   Muscle ache 12/27/2021   Left lumbar radiculopathy 11/06/2021   Gait abnormality 11/06/2021   Trochanteric bursitis, left hip 05/04/2021   S/P Nissen fundoplication (without gastrostomy tube) procedure 03/01/2020   Pleuritic chest pain    Heart block AV complete (HCC) 05/19/2019   Chronic right shoulder pain 02/13/2017   Short Achilles tendon (acquired), left ankle 02/13/2017   Dizziness and giddiness 02/16/2016   Monoallelic mutation of CHEK2 gene 02/02/2015   Genetic testing 01/31/2015   Altered mental status 10/25/2014   Occipital neuralgia of left side    Cerebral infarction (HCC) brainstem s/p IV tPA (not seen on MRI) 10/07/2014   History of permanent cardiac pacemaker placement 10/07/2014   Cephalalgia    Complicated migraine    HLD (hyperlipidemia)    Headache 10/06/2014   Arthritis of left hip 06/12/2013   Status post THR (total hip replacement) 06/12/2013   Constipation 05/25/2013   Orthostatic hypotension 01/27/2013   Mechanical complication of cardiac pacemaker electrode 01/27/2013   Pre-syncope 01/02/2013   Sinus tachycardia 01/01/2013   Second degree heart block 12/27/2012   Hypokalemia 12/27/2012   Hypothyroidism 12/27/2012   Spigelian hernia 04/20/2011   DYSPNEA 05/09/2010   Malignant neoplasm of female breast (HCC) 05/05/2010   Asthma 05/05/2010   Diaphragmatic hernia 05/05/2010   Osteoarthritis 05/05/2010   Disorder of bone and cartilage 05/05/2010   Past Medical History:  Past Medical History:  Diagnosis Date   ADHD (attention deficit hyperactivity disorder)    Allergy     trees/pollen, mold, fungus, dust mites. Takes allergy shots   Arthritis    PAIN AND OA LEFT HIP   Asthma    allergist Dr Illene Labrador- monthly allergy injections   Autonomic dysfunction    CENTRAL NERVOUS SYSTEM NEUROPATHY - DX BY DR. Sandria Manly MORE THAN 10 YRS AGO - AND IT IS FELT TO CONTRIBUTE TO THE AUTOMIC  DYSFUNCTION-- PT HAS NUMBNESS LEGS AND FEET AND SOMETIIMES TIPS OF FINGER, SEVERE CONSTIPATION( NO SENSATION TO HAVE BM ), DOUBLE VISION, ORTHOSTATIC HYPOTENSION   Blood transfusion    Breast cancer (HCC)    right side   Bruises easily    Cataract    Chest pain    a. 12/2012 Cath: nl cors, EF 55-65%.   Complication of anesthesia    reaction to some anesthetics/ 7/12 anesth record on chart- states prefers epidural   CVA (cerebrovascular accident) (HCC) 03/21/2018   Depression    Diverticulosis    Dysrhythmia    HX OF HIGH GRADE HEART BLOCK - REQUIRED PACEMAKER INSERTION   Esophageal stricture    Gastritis    GERD (gastroesophageal reflux disease)    H/O hiatal hernia    Hemorrhoids    Hernia of abdominal wall    spigelian hernia RLQ - SURGERY TO REPAIR   Hypothyroidism    Interstitial cystitis    Latex allergy, contact dermatitis    Mallory - Weiss tear    HEALED    Neuromuscular disorder (HCC)    central nervous system neuropathy- seen per Dr Sandria Manly   Pacemaker    Pericardial effusion    a. 12/2012 following ppm placement;  b. 01/01/2013 Echo:  EF 55-60%, small pericardial effusion w/o RV collapse-->No need for tap/window.   PONV (postoperative nausea and vomiting)    pt needs scop patch   Recurrent upper respiratory infection (URI) 1/13- to present   bronchitis following surgery- states improved but still with cough. OV with [facility] Dr [name] 09/06/11 on chart   Shortness of breath    AT TIMES - BUT MUCH IMPROVED AFTER PACEMAKER WAS REPROGRAMED.   Symptomatic bradycardia    a. 12/2012 s/p MDT dual chamber PPM, ser # [id number]; b. 12/2012 post-op course complicated by pericardial  effusinon req lead revision.   Thyroid disease    Past Surgical History:  Past Surgical History:  Procedure Laterality Date   ABDOMINAL HYSTERECTOMY  1974   BACK SURGERY     cervical fusion 4-5 with plate   BLADDER SUSPENSION     BREAST BIOPSY  2002   NO BLOOD PRESSURES ON RIGHT SIDE/   s/p  axillary node dissection   CYSTO WITH HYDRODISTENSION  09/07/2011   Procedure: CYSTOSCOPY/HYDRODISTENSION;  Surgeon: Kathi Ludwig, MD;  Location: [facility];  Service: Urology;  Laterality: N/A;  INSTILLATION OF MARCAINE/PYRIDIUM INSTILLATION OF MARCAINE/KENALOG   CYSTOSCOPY  1975, 2007   EYE SURGERY     LASIK EYE SURGERY BILATERAL   LAPAROSCOPY  1973   LEAD REVISION N/A 12/30/2012   Procedure: LEAD REVISION;  Surgeon: Marinus Maw, MD;  Location: [facility] CATH LAB;  Service: Cardiovascular;  Laterality: N/A;   LEFT HEART CATHETERIZATION WITH CORONARY ANGIOGRAM N/A 12/29/2012   Procedure: LEFT HEART CATHETERIZATION WITH CORONARY ANGIOGRAM;  Surgeon: Peter M Swaziland, MD;  Location: [facility] CATH LAB;  Service: Cardiovascular;  Laterality: N/A;   MASTECTOMY MODIFIED RADICAL     right; with immediate reconstruction   MYRINGOPLASTY  1962   NECK SURGERY     c4-5 ruptured disc   OOPHORECTOMY  1982   PACEMAKER INSERTION     PERMANENT PACEMAKER INSERTION N/A 12/29/2012   Procedure: PERMANENT PACEMAKER INSERTION;  Surgeon: Duke Salvia, MD;  Location: [facility] CATH LAB;  Service: Cardiovascular;  Laterality: N/A;   PPM GENERATOR CHANGEOUT N/A 02/27/2023   Procedure: PPM GENERATOR CHANGEOUT;  Surgeon: Duke Salvia, MD;  Location: [facility] INVASIVE CV LAB;  Service: Cardiovascular;  Laterality: N/A;   RECTOCELE REPAIR     with cystocele repair   TONSILLECTOMY     TOTAL HIP ARTHROPLASTY Right    TOTAL HIP ARTHROPLASTY Left 06/12/2013   Procedure: LEFT TOTAL HIP ARTHROPLASTY ANTERIOR APPROACH;  Surgeon: Kathryne Hitch, MD;  Location: [facility];  Service: Orthopedics;  Laterality: Left;   TYMPANOPLASTY  1973   right    ULNAR NERVE TRANSPOSITION Right    VENTRAL HERNIA REPAIR  05/30/2011   Procedure: HERNIA REPAIR VENTRAL ADULT;  Surgeon: Currie Paris, MD;  Location:  SURGERY CENTER;  Service: General;  Laterality: Right;  repair right spigelian hernia   HPI:  [name] is a 77 yo female presenting to ED 11/26 with headache, speech difficulty, dizziness, and R sided weakness. CTH and CTA negative. MRI Brain pending. PMH includes headaches, MRI negative brainstem stroke (2019), heart block s/p pacemaker, lumbar radiculopathy, autonomic dysfunction, orthostatic hypotension   Assessment / Plan / Recommendation Clinical Impression  Pt reports history of cognitive decline, requiring increased assistance from her husband for medication management and handling finances. She scored 17/30 on the SLUMS, which is below the score of 27 and above that is considered [facility] and is indicative of a moderate cognitive impairment.  Pt's deficits are characterized by difficulty with memory, sustained attention, and problem solving. She had significant difficulty with planning and self-correcting evidenced through the clock drawing task. Pt demonstrated good awareness or errors, but did not make attempts to correct them even given prompts to do so. Overall, feel that pt may benefit from ongoing SLP f/u acutely and upon d/c to target deficits listed above.    SLP Assessment  SLP Recommendation/Assessment: Patient needs continued Speech Lanaguage Pathology Services SLP Visit Diagnosis: Cognitive communication deficit (R41.841)    Recommendations for follow up therapy are one component of a multi-disciplinary discharge planning process, led by the attending physician.  Recommendations may be updated based on patient status, additional functional criteria and insurance authorization.    Follow Up Recommendations  Outpatient SLP    Assistance Recommended at Discharge  Intermittent Supervision/Assistance   Functional Status Assessment Patient has had a recent decline in their functional status and demonstrates the ability to make significant improvements in function in a reasonable and predictable amount of time.  Frequency and Duration min 2x/week  1 week      SLP Evaluation Cognition  Overall Cognitive Status: Impaired/Different from baseline Arousal/Alertness: Awake/alert Orientation Level: Oriented X4 Attention: Sustained Sustained Attention: Impaired Sustained Attention Impairment: Verbal basic;Functional basic Memory: Impaired Memory Impairment: Retrieval deficit;Storage deficit Awareness: Appears intact Problem Solving: Impaired Problem Solving Impairment: Verbal basic       Comprehension  Auditory Comprehension Overall Auditory Comprehension: Appears within functional limits for tasks assessed    Expression Expression Primary Mode of Expression: Verbal Verbal Expression Overall Verbal Expression: Appears within functional limits for tasks assessed   Oral / Motor  Oral Motor/Sensory Function Overall Oral Motor/Sensory Function: Within functional limits Motor Speech Overall Motor Speech: Appears within functional limits for tasks assessed            Gwynneth Aliment, M.A., CF-SLP Speech Language Pathology, Acute Rehabilitation Services  Secure Chat preferred [phone]  04/10/2023, 10:56 AM

## 2023-04-14 DIAGNOSIS — F32A Depression, unspecified: Secondary | ICD-10-CM | POA: Diagnosis not present

## 2023-04-14 DIAGNOSIS — F419 Anxiety disorder, unspecified: Secondary | ICD-10-CM | POA: Diagnosis not present

## 2023-04-14 DIAGNOSIS — M25511 Pain in right shoulder: Secondary | ICD-10-CM | POA: Diagnosis not present

## 2023-04-14 DIAGNOSIS — F900 Attention-deficit hyperactivity disorder, predominantly inattentive type: Secondary | ICD-10-CM | POA: Diagnosis not present

## 2023-04-14 DIAGNOSIS — D649 Anemia, unspecified: Secondary | ICD-10-CM | POA: Diagnosis not present

## 2023-04-14 DIAGNOSIS — I10 Essential (primary) hypertension: Secondary | ICD-10-CM | POA: Diagnosis not present

## 2023-04-14 DIAGNOSIS — M7062 Trochanteric bursitis, left hip: Secondary | ICD-10-CM | POA: Diagnosis not present

## 2023-04-14 DIAGNOSIS — I700 Atherosclerosis of aorta: Secondary | ICD-10-CM | POA: Diagnosis not present

## 2023-04-14 DIAGNOSIS — M791 Myalgia, unspecified site: Secondary | ICD-10-CM | POA: Diagnosis not present

## 2023-04-14 DIAGNOSIS — E785 Hyperlipidemia, unspecified: Secondary | ICD-10-CM | POA: Diagnosis not present

## 2023-04-14 DIAGNOSIS — M1612 Unilateral primary osteoarthritis, left hip: Secondary | ICD-10-CM | POA: Diagnosis not present

## 2023-04-14 DIAGNOSIS — E876 Hypokalemia: Secondary | ICD-10-CM | POA: Diagnosis not present

## 2023-04-14 DIAGNOSIS — I442 Atrioventricular block, complete: Secondary | ICD-10-CM | POA: Diagnosis not present

## 2023-04-14 DIAGNOSIS — M5416 Radiculopathy, lumbar region: Secondary | ICD-10-CM | POA: Diagnosis not present

## 2023-04-14 DIAGNOSIS — K5900 Constipation, unspecified: Secondary | ICD-10-CM | POA: Diagnosis not present

## 2023-04-14 DIAGNOSIS — G43909 Migraine, unspecified, not intractable, without status migrainosus: Secondary | ICD-10-CM | POA: Diagnosis not present

## 2023-04-14 DIAGNOSIS — E039 Hypothyroidism, unspecified: Secondary | ICD-10-CM | POA: Diagnosis not present

## 2023-04-14 DIAGNOSIS — K5790 Diverticulosis of intestine, part unspecified, without perforation or abscess without bleeding: Secondary | ICD-10-CM | POA: Diagnosis not present

## 2023-04-14 DIAGNOSIS — G8929 Other chronic pain: Secondary | ICD-10-CM | POA: Diagnosis not present

## 2023-04-14 DIAGNOSIS — J301 Allergic rhinitis due to pollen: Secondary | ICD-10-CM | POA: Diagnosis not present

## 2023-04-14 DIAGNOSIS — G9009 Other idiopathic peripheral autonomic neuropathy: Secondary | ICD-10-CM | POA: Diagnosis not present

## 2023-04-14 DIAGNOSIS — J45909 Unspecified asthma, uncomplicated: Secondary | ICD-10-CM | POA: Diagnosis not present

## 2023-04-14 DIAGNOSIS — K219 Gastro-esophageal reflux disease without esophagitis: Secondary | ICD-10-CM | POA: Diagnosis not present

## 2023-04-14 DIAGNOSIS — M5481 Occipital neuralgia: Secondary | ICD-10-CM | POA: Diagnosis not present

## 2023-04-14 DIAGNOSIS — I951 Orthostatic hypotension: Secondary | ICD-10-CM | POA: Diagnosis not present

## 2023-04-15 ENCOUNTER — Telehealth: Payer: Self-pay | Admitting: Neurology

## 2023-04-15 NOTE — Telephone Encounter (Signed)
Pt's husband called stating that the pt was in the ER for an Acute Migraine. Please call pt to schedule.

## 2023-04-17 DIAGNOSIS — I700 Atherosclerosis of aorta: Secondary | ICD-10-CM | POA: Diagnosis not present

## 2023-04-17 DIAGNOSIS — I480 Paroxysmal atrial fibrillation: Secondary | ICD-10-CM | POA: Diagnosis not present

## 2023-04-17 DIAGNOSIS — G43919 Migraine, unspecified, intractable, without status migrainosus: Secondary | ICD-10-CM | POA: Diagnosis not present

## 2023-04-17 DIAGNOSIS — G909 Disorder of the autonomic nervous system, unspecified: Secondary | ICD-10-CM | POA: Diagnosis not present

## 2023-04-17 DIAGNOSIS — E039 Hypothyroidism, unspecified: Secondary | ICD-10-CM | POA: Diagnosis not present

## 2023-04-18 ENCOUNTER — Other Ambulatory Visit: Payer: Self-pay | Admitting: Internal Medicine

## 2023-04-19 DIAGNOSIS — I10 Essential (primary) hypertension: Secondary | ICD-10-CM | POA: Diagnosis not present

## 2023-04-19 DIAGNOSIS — F900 Attention-deficit hyperactivity disorder, predominantly inattentive type: Secondary | ICD-10-CM | POA: Diagnosis not present

## 2023-04-19 DIAGNOSIS — I951 Orthostatic hypotension: Secondary | ICD-10-CM | POA: Diagnosis not present

## 2023-04-19 DIAGNOSIS — G9009 Other idiopathic peripheral autonomic neuropathy: Secondary | ICD-10-CM | POA: Diagnosis not present

## 2023-04-19 DIAGNOSIS — E785 Hyperlipidemia, unspecified: Secondary | ICD-10-CM | POA: Diagnosis not present

## 2023-04-19 DIAGNOSIS — M1612 Unilateral primary osteoarthritis, left hip: Secondary | ICD-10-CM | POA: Diagnosis not present

## 2023-04-21 ENCOUNTER — Encounter: Payer: Self-pay | Admitting: Internal Medicine

## 2023-04-22 DIAGNOSIS — M1612 Unilateral primary osteoarthritis, left hip: Secondary | ICD-10-CM | POA: Diagnosis not present

## 2023-04-22 DIAGNOSIS — F900 Attention-deficit hyperactivity disorder, predominantly inattentive type: Secondary | ICD-10-CM | POA: Diagnosis not present

## 2023-04-22 DIAGNOSIS — E785 Hyperlipidemia, unspecified: Secondary | ICD-10-CM | POA: Diagnosis not present

## 2023-04-22 DIAGNOSIS — G9009 Other idiopathic peripheral autonomic neuropathy: Secondary | ICD-10-CM | POA: Diagnosis not present

## 2023-04-22 DIAGNOSIS — I10 Essential (primary) hypertension: Secondary | ICD-10-CM | POA: Diagnosis not present

## 2023-04-22 DIAGNOSIS — I951 Orthostatic hypotension: Secondary | ICD-10-CM | POA: Diagnosis not present

## 2023-04-27 DIAGNOSIS — I10 Essential (primary) hypertension: Secondary | ICD-10-CM | POA: Diagnosis not present

## 2023-04-27 DIAGNOSIS — E785 Hyperlipidemia, unspecified: Secondary | ICD-10-CM | POA: Diagnosis not present

## 2023-04-27 DIAGNOSIS — M1612 Unilateral primary osteoarthritis, left hip: Secondary | ICD-10-CM | POA: Diagnosis not present

## 2023-04-27 DIAGNOSIS — G9009 Other idiopathic peripheral autonomic neuropathy: Secondary | ICD-10-CM | POA: Diagnosis not present

## 2023-04-27 DIAGNOSIS — I951 Orthostatic hypotension: Secondary | ICD-10-CM | POA: Diagnosis not present

## 2023-04-27 DIAGNOSIS — F900 Attention-deficit hyperactivity disorder, predominantly inattentive type: Secondary | ICD-10-CM | POA: Diagnosis not present

## 2023-04-29 DIAGNOSIS — I951 Orthostatic hypotension: Secondary | ICD-10-CM | POA: Diagnosis not present

## 2023-04-29 DIAGNOSIS — G9009 Other idiopathic peripheral autonomic neuropathy: Secondary | ICD-10-CM | POA: Diagnosis not present

## 2023-04-29 DIAGNOSIS — E785 Hyperlipidemia, unspecified: Secondary | ICD-10-CM | POA: Diagnosis not present

## 2023-04-29 DIAGNOSIS — M1612 Unilateral primary osteoarthritis, left hip: Secondary | ICD-10-CM | POA: Diagnosis not present

## 2023-04-29 DIAGNOSIS — I10 Essential (primary) hypertension: Secondary | ICD-10-CM | POA: Diagnosis not present

## 2023-04-29 DIAGNOSIS — F900 Attention-deficit hyperactivity disorder, predominantly inattentive type: Secondary | ICD-10-CM | POA: Diagnosis not present

## 2023-05-03 DIAGNOSIS — G9009 Other idiopathic peripheral autonomic neuropathy: Secondary | ICD-10-CM | POA: Diagnosis not present

## 2023-05-03 DIAGNOSIS — F900 Attention-deficit hyperactivity disorder, predominantly inattentive type: Secondary | ICD-10-CM | POA: Diagnosis not present

## 2023-05-03 DIAGNOSIS — E785 Hyperlipidemia, unspecified: Secondary | ICD-10-CM | POA: Diagnosis not present

## 2023-05-03 DIAGNOSIS — I10 Essential (primary) hypertension: Secondary | ICD-10-CM | POA: Diagnosis not present

## 2023-05-03 DIAGNOSIS — M1612 Unilateral primary osteoarthritis, left hip: Secondary | ICD-10-CM | POA: Diagnosis not present

## 2023-05-03 DIAGNOSIS — I951 Orthostatic hypotension: Secondary | ICD-10-CM | POA: Diagnosis not present

## 2023-05-09 ENCOUNTER — Ambulatory Visit (HOSPITAL_BASED_OUTPATIENT_CLINIC_OR_DEPARTMENT_OTHER): Payer: Medicare Other | Admitting: Physical Therapy

## 2023-05-09 DIAGNOSIS — I951 Orthostatic hypotension: Secondary | ICD-10-CM | POA: Diagnosis not present

## 2023-05-09 DIAGNOSIS — E785 Hyperlipidemia, unspecified: Secondary | ICD-10-CM | POA: Diagnosis not present

## 2023-05-09 DIAGNOSIS — M1612 Unilateral primary osteoarthritis, left hip: Secondary | ICD-10-CM | POA: Diagnosis not present

## 2023-05-09 DIAGNOSIS — G9009 Other idiopathic peripheral autonomic neuropathy: Secondary | ICD-10-CM | POA: Diagnosis not present

## 2023-05-09 DIAGNOSIS — F900 Attention-deficit hyperactivity disorder, predominantly inattentive type: Secondary | ICD-10-CM | POA: Diagnosis not present

## 2023-05-09 DIAGNOSIS — I10 Essential (primary) hypertension: Secondary | ICD-10-CM | POA: Diagnosis not present

## 2023-05-13 DIAGNOSIS — G9009 Other idiopathic peripheral autonomic neuropathy: Secondary | ICD-10-CM | POA: Diagnosis not present

## 2023-05-13 DIAGNOSIS — Z23 Encounter for immunization: Secondary | ICD-10-CM | POA: Diagnosis not present

## 2023-05-13 DIAGNOSIS — I951 Orthostatic hypotension: Secondary | ICD-10-CM | POA: Diagnosis not present

## 2023-05-13 DIAGNOSIS — I10 Essential (primary) hypertension: Secondary | ICD-10-CM | POA: Diagnosis not present

## 2023-05-13 DIAGNOSIS — E785 Hyperlipidemia, unspecified: Secondary | ICD-10-CM | POA: Diagnosis not present

## 2023-05-13 DIAGNOSIS — M1612 Unilateral primary osteoarthritis, left hip: Secondary | ICD-10-CM | POA: Diagnosis not present

## 2023-05-13 DIAGNOSIS — F900 Attention-deficit hyperactivity disorder, predominantly inattentive type: Secondary | ICD-10-CM | POA: Diagnosis not present

## 2023-05-14 ENCOUNTER — Ambulatory Visit (HOSPITAL_COMMUNITY)
Admission: RE | Admit: 2023-05-14 | Discharge: 2023-05-14 | Disposition: A | Discharge status: Home / Self Care | Payer: Medicare Other | Source: Ambulatory Visit | Attending: Obstetrics | Admitting: Obstetrics

## 2023-05-14 DIAGNOSIS — K5790 Diverticulosis of intestine, part unspecified, without perforation or abscess without bleeding: Secondary | ICD-10-CM | POA: Diagnosis not present

## 2023-05-14 DIAGNOSIS — R9230 Dense breasts, unspecified: Secondary | ICD-10-CM | POA: Diagnosis not present

## 2023-05-14 DIAGNOSIS — F32A Depression, unspecified: Secondary | ICD-10-CM | POA: Diagnosis not present

## 2023-05-14 DIAGNOSIS — Z853 Personal history of malignant neoplasm of breast: Secondary | ICD-10-CM | POA: Diagnosis not present

## 2023-05-14 DIAGNOSIS — I700 Atherosclerosis of aorta: Secondary | ICD-10-CM | POA: Diagnosis not present

## 2023-05-14 DIAGNOSIS — Z1239 Encounter for other screening for malignant neoplasm of breast: Secondary | ICD-10-CM | POA: Diagnosis not present

## 2023-05-14 DIAGNOSIS — I951 Orthostatic hypotension: Secondary | ICD-10-CM | POA: Diagnosis not present

## 2023-05-14 DIAGNOSIS — E039 Hypothyroidism, unspecified: Secondary | ICD-10-CM | POA: Diagnosis not present

## 2023-05-14 DIAGNOSIS — Z9189 Other specified personal risk factors, not elsewhere classified: Secondary | ICD-10-CM | POA: Insufficient documentation

## 2023-05-14 DIAGNOSIS — I10 Essential (primary) hypertension: Secondary | ICD-10-CM | POA: Diagnosis not present

## 2023-05-14 DIAGNOSIS — K219 Gastro-esophageal reflux disease without esophagitis: Secondary | ICD-10-CM | POA: Diagnosis not present

## 2023-05-14 DIAGNOSIS — F900 Attention-deficit hyperactivity disorder, predominantly inattentive type: Secondary | ICD-10-CM | POA: Diagnosis not present

## 2023-05-14 DIAGNOSIS — E785 Hyperlipidemia, unspecified: Secondary | ICD-10-CM | POA: Diagnosis not present

## 2023-05-14 DIAGNOSIS — M5416 Radiculopathy, lumbar region: Secondary | ICD-10-CM | POA: Diagnosis not present

## 2023-05-14 DIAGNOSIS — K5900 Constipation, unspecified: Secondary | ICD-10-CM | POA: Diagnosis not present

## 2023-05-14 DIAGNOSIS — M791 Myalgia, unspecified site: Secondary | ICD-10-CM | POA: Diagnosis not present

## 2023-05-14 DIAGNOSIS — M7062 Trochanteric bursitis, left hip: Secondary | ICD-10-CM | POA: Diagnosis not present

## 2023-05-14 DIAGNOSIS — N644 Mastodynia: Secondary | ICD-10-CM | POA: Diagnosis not present

## 2023-05-14 DIAGNOSIS — D649 Anemia, unspecified: Secondary | ICD-10-CM | POA: Diagnosis not present

## 2023-05-14 DIAGNOSIS — G8929 Other chronic pain: Secondary | ICD-10-CM | POA: Diagnosis not present

## 2023-05-14 DIAGNOSIS — J301 Allergic rhinitis due to pollen: Secondary | ICD-10-CM | POA: Diagnosis not present

## 2023-05-14 DIAGNOSIS — M5481 Occipital neuralgia: Secondary | ICD-10-CM | POA: Diagnosis not present

## 2023-05-14 DIAGNOSIS — F419 Anxiety disorder, unspecified: Secondary | ICD-10-CM | POA: Diagnosis not present

## 2023-05-14 DIAGNOSIS — E876 Hypokalemia: Secondary | ICD-10-CM | POA: Diagnosis not present

## 2023-05-14 DIAGNOSIS — G9009 Other idiopathic peripheral autonomic neuropathy: Secondary | ICD-10-CM | POA: Diagnosis not present

## 2023-05-14 DIAGNOSIS — G43909 Migraine, unspecified, not intractable, without status migrainosus: Secondary | ICD-10-CM | POA: Diagnosis not present

## 2023-05-14 DIAGNOSIS — M1612 Unilateral primary osteoarthritis, left hip: Secondary | ICD-10-CM | POA: Diagnosis not present

## 2023-05-14 DIAGNOSIS — I442 Atrioventricular block, complete: Secondary | ICD-10-CM | POA: Diagnosis not present

## 2023-05-14 DIAGNOSIS — M25511 Pain in right shoulder: Secondary | ICD-10-CM | POA: Diagnosis not present

## 2023-05-14 DIAGNOSIS — J45909 Unspecified asthma, uncomplicated: Secondary | ICD-10-CM | POA: Diagnosis not present

## 2023-05-14 MED ORDER — GADOBUTROL 1 MMOL/ML IV SOLN
7.0000 mL | Freq: Once | INTRAVENOUS | Status: AC | PRN
  Administered 2023-05-14: 7 mL via INTRAVENOUS

## 2023-05-20 DIAGNOSIS — F329 Major depressive disorder, single episode, unspecified: Secondary | ICD-10-CM | POA: Diagnosis not present

## 2023-05-20 DIAGNOSIS — G909 Disorder of the autonomic nervous system, unspecified: Secondary | ICD-10-CM | POA: Diagnosis not present

## 2023-05-20 DIAGNOSIS — I480 Paroxysmal atrial fibrillation: Secondary | ICD-10-CM | POA: Diagnosis not present

## 2023-05-20 DIAGNOSIS — R531 Weakness: Secondary | ICD-10-CM | POA: Diagnosis not present

## 2023-05-23 DIAGNOSIS — I951 Orthostatic hypotension: Secondary | ICD-10-CM | POA: Diagnosis not present

## 2023-05-23 DIAGNOSIS — I10 Essential (primary) hypertension: Secondary | ICD-10-CM | POA: Diagnosis not present

## 2023-05-23 DIAGNOSIS — M1612 Unilateral primary osteoarthritis, left hip: Secondary | ICD-10-CM | POA: Diagnosis not present

## 2023-05-23 DIAGNOSIS — G9009 Other idiopathic peripheral autonomic neuropathy: Secondary | ICD-10-CM | POA: Diagnosis not present

## 2023-05-23 DIAGNOSIS — E785 Hyperlipidemia, unspecified: Secondary | ICD-10-CM | POA: Diagnosis not present

## 2023-05-23 DIAGNOSIS — F900 Attention-deficit hyperactivity disorder, predominantly inattentive type: Secondary | ICD-10-CM | POA: Diagnosis not present

## 2023-05-29 ENCOUNTER — Ambulatory Visit: Payer: Medicare Other | Attending: Internal Medicine | Admitting: Physical Therapy

## 2023-05-29 ENCOUNTER — Other Ambulatory Visit: Payer: Self-pay

## 2023-05-29 ENCOUNTER — Ambulatory Visit (INDEPENDENT_AMBULATORY_CARE_PROVIDER_SITE_OTHER): Payer: Medicare Other

## 2023-05-29 ENCOUNTER — Encounter: Payer: Self-pay | Admitting: Physical Therapy

## 2023-05-29 VITALS — BP 86/58 | HR 86

## 2023-05-29 DIAGNOSIS — R2681 Unsteadiness on feet: Secondary | ICD-10-CM | POA: Insufficient documentation

## 2023-05-29 DIAGNOSIS — R2689 Other abnormalities of gait and mobility: Secondary | ICD-10-CM | POA: Insufficient documentation

## 2023-05-29 DIAGNOSIS — M6281 Muscle weakness (generalized): Secondary | ICD-10-CM | POA: Diagnosis not present

## 2023-05-29 DIAGNOSIS — I442 Atrioventricular block, complete: Secondary | ICD-10-CM

## 2023-05-29 NOTE — Therapy (Signed)
OUTPATIENT PHYSICAL THERAPY NEURO EVALUATION   Patient Name: [name] MRN: [id number] DOB:1945/12/30, 78 y.o., female Today's Date: 05/30/2023   PCP: Geoffry Paradise, MD REFERRING PROVIDER: Geoffry Paradise, MD   PT End of Session - 05/29/23 1408     Visit Number 1    Number of Visits 1    Date for PT Re-Evaluation 03/29/23    Authorization Type Medicare    PT Start Time 1405    PT Stop Time 1445    PT Time Calculation (min) 40 min    Equipment Utilized During Treatment Other (comment)   gait belt not used as no functional measures   Activity Tolerance Patient tolerated treatment well    Behavior During Therapy WFL for tasks assessed/performed             Past Medical History:  Diagnosis Date   ADHD (attention deficit hyperactivity disorder)    Allergy    trees/pollen, mold, fungus, dust mites. Takes allergy shots   Arthritis    PAIN AND OA LEFT HIP   Asthma    allergist Dr Illene Labrador- monthly allergy injections   Autonomic dysfunction    CENTRAL NERVOUS SYSTEM NEUROPATHY - DX BY DR. Sandria Manly MORE THAN 10 YRS AGO - AND IT IS FELT TO CONTRIBUTE TO THE AUTOMIC  DYSFUNCTION-- PT HAS NUMBNESS LEGS AND FEET AND SOMETIIMES TIPS OF FINGER, SEVERE CONSTIPATION( NO SENSATION TO HAVE BM ), DOUBLE VISION, ORTHOSTATIC HYPOTENSION   Blood transfusion    Breast cancer (HCC)    right side   Bruises easily    Cataract    Chest pain    a. 12/2012 Cath: nl cors, EF 55-65%.   Complication of anesthesia    reaction to some anesthetics/ 7/12 anesth record on chart- states prefers epidural   CVA (cerebrovascular accident) (HCC) 03/21/2018   Depression    Diverticulosis    Dysrhythmia    HX OF HIGH GRADE HEART BLOCK - REQUIRED PACEMAKER INSERTION   Esophageal stricture    Gastritis    GERD (gastroesophageal reflux disease)    H/O hiatal hernia    Hemorrhoids    Hernia of abdominal wall    spigelian hernia RLQ - SURGERY TO REPAIR   Hypothyroidism    Interstitial cystitis     Latex allergy, contact dermatitis    Mallory - Weiss tear    HEALED    Neuromuscular disorder (HCC)    central nervous system neuropathy- seen per Dr Sandria Manly   Pacemaker    Pericardial effusion    a. 12/2012 following ppm placement;  b. 01/01/2013 Echo: EF 55-60%, small pericardial effusion w/o RV collapse-->No need for tap/window.   PONV (postoperative nausea and vomiting)    pt needs scop patch   Recurrent upper respiratory infection (URI) 1/13- to present   bronchitis following surgery- states improved but still with cough. OV with [facility] Dr [name] 09/06/11 on chart   Shortness of breath    AT TIMES - BUT MUCH IMPROVED AFTER PACEMAKER WAS REPROGRAMED.   Symptomatic bradycardia    a. 12/2012 s/p MDT dual chamber PPM, ser # [id number]; b. 12/2012 post-op course complicated by pericardial effusinon req lead revision.   Thyroid disease    Past Surgical History:  Procedure Laterality Date   ABDOMINAL HYSTERECTOMY  1974   BACK SURGERY     cervical fusion 4-5 with plate   BLADDER SUSPENSION     BREAST BIOPSY  2002   NO BLOOD PRESSURES ON RIGHT SIDE/   s/p  axillary node dissection   CYSTO WITH HYDRODISTENSION  09/07/2011   Procedure: CYSTOSCOPY/HYDRODISTENSION;  Surgeon: Kathi Ludwig, MD;  Location: [facility];  Service: Urology;  Laterality: N/A;  INSTILLATION OF MARCAINE/PYRIDIUM INSTILLATION OF MARCAINE/KENALOG   CYSTOSCOPY  1975, 2007   EYE SURGERY     LASIK EYE SURGERY BILATERAL   LAPAROSCOPY  1973   LEAD REVISION N/A 12/30/2012   Procedure: LEAD REVISION;  Surgeon: Marinus Maw, MD;  [facility] C[facility] LAB;  Service: Cardiovascular;  Laterality: N/A;   LEFT HEART CATHETERIZATION WITH CORONARY ANGIOGRAM N/A 12/29/2012   Procedure: LEFT HEART CATHETERIZATION WITH CORONARY ANGIOGRAM;  Surgeon: Peter M Swaziland, MD;  Location: [facility] CATH LAB;  Service: Cardiovascular;  Laterality: N/A;   MASTECTOMY MODIFIED RADICAL     right; with immediate reconstruction   MYRINGOPLASTY  1962   NECK  SURGERY     c4-5 ruptured disc   OOPHORECTOMY  1982   PACEMAKER INSERTION     PERMANENT PACEMAKER INSERTION N/A 12/29/2012   Procedure: PERMANENT PACEMAKER INSERTION;  Surgeon: Duke Salvia, MD;  Location: [facility] CATH LAB;  Service: Cardiovascular;  Laterality: N/A;   PPM GENERATOR CHANGEOUT N/A 02/27/2023   Procedure: PPM GENERATOR CHANGEOUT;  Surgeon: Duke Salvia, MD;  Location: [facility] INVASIVE CV LAB;  Service: Cardiovascular;  Laterality: N/A;   RECTOCELE REPAIR     with cystocele repair   TONSILLECTOMY     TOTAL HIP ARTHROPLASTY Right    TOTAL HIP ARTHROPLASTY Left 06/12/2013   Procedure: LEFT TOTAL HIP ARTHROPLASTY ANTERIOR APPROACH;  Surgeon: Kathryne Hitch, MD;  Location: [facility];  Service: Orthopedics;  Laterality: Left;   TYMPANOPLASTY  1973   right   ULNAR NERVE TRANSPOSITION Right    VENTRAL HERNIA REPAIR  05/30/2011   Procedure: HERNIA REPAIR VENTRAL ADULT;  Surgeon: Currie Paris, MD;  Location: [facility];  Service: General;  Laterality: Right;  repair right spigelian hernia   Patient Active Problem List   Diagnosis Date Noted   Focal neurological deficit 04/09/2023   Depression    Pain in both lower extremities 01/07/2022   Muscle ache 12/27/2021   Left lumbar radiculopathy 11/06/2021   Gait abnormality 11/06/2021   Trochanteric bursitis, left hip 05/04/2021   S/P Nissen fundoplication (without gastrostomy tube) procedure 03/01/2020   Pleuritic chest pain    Heart block AV complete (HCC) 05/19/2019   Chronic right shoulder pain 02/13/2017   Short Achilles tendon (acquired), left ankle 02/13/2017   Dizziness and giddiness 02/16/2016   Monoallelic mutation of CHEK2 gene 02/02/2015   Genetic testing 01/31/2015   Altered mental status 10/25/2014   Occipital neuralgia of left side    Cerebral infarction (HCC) brainstem s/p IV tPA (not seen on MRI) 10/07/2014   History of permanent cardiac pacemaker placement 10/07/2014   Cephalalgia     Complicated migraine    HLD (hyperlipidemia)    Headache 10/06/2014   Arthritis of left hip 06/12/2013   Status post THR (total hip replacement) 06/12/2013   Constipation 05/25/2013   Orthostatic hypotension 01/27/2013   Mechanical complication of cardiac pacemaker electrode 01/27/2013   Pre-syncope 01/02/2013   Sinus tachycardia 01/01/2013   Second degree heart block 12/27/2012   Hypokalemia 12/27/2012   Hypothyroidism 12/27/2012   Spigelian hernia 04/20/2011   DYSPNEA 05/09/2010   Malignant neoplasm of female breast (HCC) 05/05/2010   Asthma 05/05/2010   Diaphragmatic hernia 05/05/2010   Osteoarthritis 05/05/2010   Disorder of bone and cartilage 05/05/2010  ONSET DATE: 05/23/2023 (referral date)   REFERRING DIAG: M25.552 (ICD-10-CM) - Pain in left hip R26.9 (ICD-10-CM) - Unspecified abnormalities of gait and mobility Z96.643 (ICD-10-CM) - Presence of artificial hip joint, bilateral R53.1 (ICD-10-CM) - Weakness M25.562 (ICD-10-CM) - Pain in left knee  THERAPY DIAG:  Muscle weakness (generalized)  Unsteadiness on feet  Other abnormalities of gait and mobility  Rationale for Evaluation and Treatment Rehabilitation  SUBJECTIVE:                                                                                                                                                                                             SUBJECTIVE STATEMENT: Patient known to this clinic previously. Patient reports major changes since going into the hospital on 04/09/2023 for what was diagnosed as a complex migraine including: syncope, insomnia, nausea when upright, loss of appetite (loss 35-40 lbs thus far), difficulty swallowing, stomach pain when eating, falling, leg weakness, major BP drops, increased SOB with exertion, and ringing in her ears. Patient has had multiple episodes of syncope with passing out when attempting to stand. Prior history of orthostatic hypotension but not as this level per  patient. Patient reports she believes she has dysautonomia but has not been able to find anyone who can manage. PT notes shuffling into clinic and patient and spouse this various but does seem to be worse as well. Patient currently using 2WW and has made adjustments to home for safety.   Pt accompanied by: self and spouse Ben  PERTINENT HISTORY: right cervical radiculopathy w/ ACDF C5-6 C6-7, chronic compression deformity of T3 w/o canal stenosis, bilateral THA, lumbar DDD, osteopenia, second degree heart block w/ pacemaker (HR does not drop below 60bpm), hypothyroidism, orthostatic hypotension, CVA, HLD, depression, Right mastectomy   PAIN:  Reports none at eval   PRECAUTIONS: Fall and ICD/Pacemaker  WEIGHT BEARING RESTRICTIONS No  FALLS: Has patient fallen in last 6 months? No  LIVING ENVIRONMENT: Lives with: lives with their spouse Lives in: House/apartment Stairs: Yes: Internal: 14 steps; on left going up and External: 2 (in garage-preferred entrance) steps; none Has following equipment at home: Single point cane, Walker - 2 wheeled, Environmental consultant - 4 wheeled, Tour manager, and Grab bars  PLOF: Independent  PATIENT GOALS Increase activity tolerance and safety   OBJECTIVE:   DIAGNOSTIC FINDINGS: 04/10/2023 MR Brain: no evidence of acute intracranial abnormality   COGNITION: Overall cognitive status:  some brain fog but [facility] for session   SENSATION: WFL   GAIT: Gait pattern: step to pattern, step through pattern, decreased hip/knee flexion- Right, decreased hip/knee flexion- Left, decreased ankle dorsiflexion- Right, decreased ankle dorsiflexion-  Left, festinating, trunk flexed, poor foot clearance- Right, and poor foot clearance- Left Distance walked: into and out of clinic Assistive device utilized: Environmental consultant - 4 wheeled and Wheelchair (manual) Level of assistance: Min A Comments: Required minA into clinic and eventually use of transport chair for safety, possible festination when  turning into room and requires cues to improve safety with 2WW, likely safety risk confounded by BP drop   VITALS:   Vitals:   05/29/23 1412 05/29/23 1414  BP: 117/75 (!) 86/58  Pulse: 80 86     Seated      Standing  Patient very symptomatic walking back into and leaving session; required physical escort from PT for both and use of transport chair leaving clinic, patient initially denied use of transport chair leaving clinic and then agreed when became symptomatic, patient caregiver brought car around and PT helped get into car. Advised movement disorder follow up with Dr. Arbutus Leas and provided contact info and advised follow up with PCP for referral. Recommended movement disorder follow up for opinion given presentation of gait and new reports of difficulty swallowing and reports of dysautonomia. Patient and spouse in agreement. Discussed that outpatient PT not appropriate at this time given medical instability and patient inability to tolerate leaving or getting into clinic safely.   GOALS: NA at this time   ASSESSMENT:  CLINICAL IMPRESSION: Patient is a 78 y.o. female who was seen today for physical therapy evaluation and treatment for pain and weakness.  Pt has a significant PMH of right cervical radiculopathy w/ ACDF C5-6 C6-7, chronic compression deformity of T3 w/o canal stenosis, bilateral THA, lumbar DDD, osteopenia, second degree heart block w/ pacemaker (HR does not drop below 60bpm), hypothyroidism, orthostatic hypotension, CVA, HLD, depression, and right mastectomy. However, patient reporting significant functional decline including difficulty swallowing, insomnia, dysautonomia, orthostatic hypotension since last seen at the clinic. Patient not appropriate for PT at this time given degree of BP drops coming into and out of the clinic to unsafe levels with symptomatic presentation as well as unexplained new reports of difficulty swallowing. Given functional changes including gait  presentation in today's session and uncontrolled BP, recommend follow up with Dr. Arbutus Leas for movement disorder consult and provided patient information as well. Discussed reasons why PT not appropriate at this time given state of instability and advised safety measures including possible transport chair to prevent major BP drops. Patient and spouse verbalized understanding.   OBJECTIVE IMPAIRMENTS Abnormal gait, cardiopulmonary status limiting activity, decreased activity tolerance, decreased balance, decreased cognition, decreased coordination, decreased endurance, decreased knowledge of condition, decreased knowledge of use of DME, decreased mobility, difficulty walking, decreased strength, decreased safety awareness, increased edema, postural dysfunction, and pain.   ACTIVITY LIMITATIONS carrying, lifting, bending, squatting, stairs, and locomotion level  PARTICIPATION LIMITATIONS: meal prep, cleaning, laundry, and community activity  PERSONAL FACTORS Age, Fitness, Past/current experiences, Time since onset of injury/illness/exacerbation, and 3+ comorbidities: osteopenia, second degree heart block w/ pacemaker, degenerative spine changes, orthostatic hypotension  are also affecting patient's functional outcome, functional changes in recent months  CLINICAL DECISION MAKING: Unstable/unpredictable  EVALUATION COMPLEXITY: High  PLAN: NA - recommend follow up with movement disorder specialist given gait presentation, reports of dysautonomia, and reports of new swallowing challenges   Carmelia Bake, PT, DPT 05/30/2023, 8:00 AM

## 2023-05-30 ENCOUNTER — Encounter: Payer: Self-pay | Admitting: Physical Therapy

## 2023-05-31 LAB — CUP PACEART REMOTE DEVICE CHECK
Battery Remaining Longevity: 139 mo
Battery Voltage: 3.19 V
Brady Statistic AP VP Percent: 21.7 %
Brady Statistic AP VS Percent: 0 %
Brady Statistic AS VP Percent: 78.3 %
Brady Statistic AS VS Percent: 0 %
Brady Statistic RA Percent Paced: 21.68 %
Brady Statistic RV Percent Paced: 100 %
Date Time Interrogation Session: 20250116132123
Implantable Lead Connection Status: 753985
Implantable Lead Connection Status: 753985
Implantable Lead Implant Date: 20140818
Implantable Lead Implant Date: 20140818
Implantable Lead Location: 753859
Implantable Lead Location: 753860
Implantable Lead Model: 5076
Implantable Lead Model: 5076
Implantable Pulse Generator Implant Date: 20241016
Lead Channel Impedance Value: 304 Ohm
Lead Channel Impedance Value: 513 Ohm
Lead Channel Impedance Value: 589 Ohm
Lead Channel Impedance Value: 608 Ohm
Lead Channel Pacing Threshold Amplitude: 0.75 V
Lead Channel Pacing Threshold Amplitude: 1.125 V
Lead Channel Pacing Threshold Pulse Width: 0.4 ms
Lead Channel Pacing Threshold Pulse Width: 0.4 ms
Lead Channel Sensing Intrinsic Amplitude: 1.625 mV
Lead Channel Sensing Intrinsic Amplitude: 1.625 mV
Lead Channel Setting Pacing Amplitude: 2.25 V
Lead Channel Setting Pacing Amplitude: 2.5 V
Lead Channel Setting Pacing Pulse Width: 0.4 ms
Lead Channel Setting Sensing Sensitivity: 1.2 mV
Zone Setting Status: 755011
Zone Setting Status: 755011

## 2023-06-04 ENCOUNTER — Telehealth: Payer: Self-pay | Admitting: Neurology

## 2023-06-04 ENCOUNTER — Encounter: Payer: Self-pay | Admitting: Neurology

## 2023-06-04 ENCOUNTER — Ambulatory Visit: Payer: Medicare Other | Admitting: Neurology

## 2023-06-04 VITALS — BP 142/86 | HR 72 | Ht 67.0 in | Wt 148.0 lb

## 2023-06-04 DIAGNOSIS — M79605 Pain in left leg: Secondary | ICD-10-CM

## 2023-06-04 DIAGNOSIS — G9089 Other disorders of autonomic nervous system: Secondary | ICD-10-CM | POA: Diagnosis not present

## 2023-06-04 DIAGNOSIS — M79604 Pain in right leg: Secondary | ICD-10-CM

## 2023-06-04 MED ORDER — DROXIDOPA 100 MG PO CAPS: 300.0000 mg | ORAL_CAPSULE | Freq: Three times a day (TID) | ORAL | 11 refills | Status: DC

## 2023-06-04 NOTE — Progress Notes (Signed)
ASSESSMENT AND PLAN  [name] is a 78 y.o. female   Worsening gait abnormality Bilateral lower extremity deep achy pain Acute autonomic failure Recent diagnosis of Sjogren's disease  Significant orthostatic hypotension without increased heart rate, even after taking Mestinon 30 mg twice a day, fludrocortisone 0.1 mg daily, and midodrine up to 15 mg 3 times a day, Sitting down: 153/82, HR 65,  Standing up: 97/63, HR 71.  Will add on droxidopa 100 mg, slowly titrating to 300 mg 3 times a day   NORTHERA is 100 mg, taken orally three times daily: upon arising in the  morning, at midday, and in the late afternoon at least 3 hours prior to bedtime (to reduce the potential for supine hypertension during sleep). Administer NORTHERA consistently, either with food or without food. Take NORTHERA capsule whole. Titrate to symptomatic response, in increments of 100 mg three times daily every 24-48 hours up to a maximum dose of 600 mg three times daily (i.e., a maximum total daily dose of 1800 mg).  Monitor supine blood pressure prior to initiating NORTHERA and after increasing the dose.   EMG nerve conduction study    She has long history of autonomic failure, mainly presenting with pan autonomic failure, sympathetic noradrenergic failure, orthostatic hypotension,  dry eye dry mouth, decreased sweat, without variation of heart rate, suggestive of  cholinergic failure  Differentiation diagnosis including pure autonomic failure due to synucleinopath, versus autoimmune mediated, paraneoplastic panel  DIAGNOSTIC DATA (LABS, IMAGING, TESTING) - I reviewed patient records, labs, notes, testing and imaging myself where available.   MEDICAL HISTORY:  [name] is a 78 year old female, seen in request by neurosurgeon Dr. Barnett Abu for evaluation of low back pain, leg weakness, her primary care physician is Dr. Geoffry Paradise, initial evaluation was on November 10, 2021  I reviewed  and summarized the referring note.PMHX. S/p pacemaker. Histroy of right cervical radiculopathy, surgery did help.  Hx of bilateral hip replacement  In October 2022, after lifting a heavy box of books, she noticed low back pain, muscle spasm and lower back pain  She was initially treated with NSAIDs, muscle relaxant with some help  But since then, with prolonged standing, bending over she will develop low back pain, then was given steroid, physical therapy,  She had a history of bilateral hip replacement,  Over the past few months, she developed worsening low back pain, radiating pain to left lateral thigh region, she received injection for left hip bursitis, did not help her symptoms  CT of left hip in February 2023 showed total arthroplasty without hardware failure or complication  CT lumbar myelogram October 25, 2021 showed lumbar region disc bulging, facet osteoarthritis with mild narrowing of lateral recess at L3-4, L4-5 without definite neural compression,  CT thoracic spine showed chronic compression deformity of T3 no significant canal stenosis  She now complains of significant left lower extremity weakness, when she lying down sitting down no significant pain, when she getting up she complains of significant left leg pain 9 out of 10, radiating to the left lateral leg, deep aching pain at nighttime, has to take frequent over-the-counter medications  UPDATE August 16th 2023: Patient is accompanied by her husband return for electrodiagnostic study today, which showed evidence of mild peripheral neuropathy if anything, the test is also limited due to her lower extremity pitting edema, relative preserved distal lower extremity motor nerve conduction study  EMG nerve conduction study showed no evidence of active lumbar radiculopathy  On further questioning, today she mainly complains of subacute onset of bilateral lower extremity deep achy pain, mainly involving left lateral thigh,  now  similar involvement of the right side, also complains of low back pain, upper extremity pain  She was treated with tapering dose of the steroid for 1 week by orthopedic surgeon for left hip pain, she did reported significant improvement afterwards, benefit last for more than 4 weeks, but she could not tolerate the side effect of high-dose of prednisone after 60 mg daily,  She later had few sessions of epidural injection without any benefit  Today she also complains of worsening urinary urgency, to the point of incontinence,  She did have a history of cervical decompression surgery in the past, for severe right cervical radiculopathy, denied gait abnormality prior to her cervical decompression surgery  UPDATE Sept 5th 2023: She is accompanied by her husband at today's clinical visit, continues to complains of moderate left lateral thigh pain, right thigh pain, gait abnormality mainly due to limitation from pain  Reviewed laboratory evaluations normal high-sensitivity C-reactive protein ESR, CPK, B12, ANA, slightly low vitamin D 26.2  Reviewed home medication list, she has been on Lexapro 10 mg since age 19, she does suffer depression following total hysterectomy, she denies depression,  Personally reviewed CT cervical spine August 2023, status post ACDF C5-6 C6-7, no significant canal foraminal narrowing  CT lumbar myelogram October 25, 2021, multilevel degenerative changes, most obvious degenerative changes worse at L4-5, facet and ligamentous hypertrophy, mild stenosis of lateral recess but without definite neural compression  Husband revealed that she has become very sedentary since 2022, likely deconditioned  Virtual Visit via video August 23 2022:  She was setup for physical therapy following last visit, but shortly after that she was called in to take care of her daughter at [place], who is facing a lots of surgery, hiatal hernia, SI joint fusion, now needing shoulder replacement.  She is  very struggling with her condition mentally and physically.  Continue complains of gait abnormality, intermittent body achy pain involving her lower extremity, knees, low back, frequent muscle spasm, sometimes leg gave out underneath her, like a rubber, risk for fall,  Update June 04 2023: She presented to emergency room on April 09, 2023 for significant headache, speech difficulty, dizziness, right-sided weakness, the headache was severe, sharp, at the right periorbital region,  Was diagnosed with complex migraine versus TIA,  Had MRI of the brain, which showed no acute abnormality, moderate atrophy, small vessel disease,  CT angiogram of head and neck showed no large vessel disease  Laboratory evaluation showed normal CBC, INR, CMP showed low potassium of 3.2, LDL 53, A1c of 5.7  Patient reported ever since then, she has significant worsening of her dizziness, profound orthostatic blood pressure change, fainting when standing up, shortness of breath walking across the room, lack of appetite, 20 pound weight loss over the past few months, loss of the taste,  She no longer have headaches, but profound orthostatic hypotension is very disability eating for her  She has been under the care of cardiologist Dr. Graciela Husbands for many years, who reported that she has insidious onset of orthostatic hypotension since 2015, also had cardiac block required pacing since 2014, previously complicated by microperforation, moderate effusion, then Adderall withdrawal, complete heart block, required pacemaker, generator replacement October 2024, seen by GI specialist for collagenous colitis, presenting with frequent diarrhea,  She also had excessive stress over the past few years, took care of her  daughter at [place], who passed away in 23-Dec-2022 She was started on midodrine as far back as 2015, was on 2.5 mg, significant increase of dosage up to 50 mg 3 times a day with limited help, also on Mestinon since  October 2024, 60 mg trigger significant GI side effect, could only tolerate 30 mg twice a day, fludrocortisone 0.1 mg daily since October   PHYSICAL EXAM:     06/04/2023    4:17 PM 05/29/2023    2:14 PM 05/29/2023    2:12 PM  Vitals with BMI  Height 5\' 7"     Weight 148 lbs    BMI 23.17    Systolic 142 86 117  Diastolic 86 58 75  Pulse 72 86 80    Sitting down: 153/82, HR 65,  Standing up: 97/63, HR 71.  MENTAL STATUS: Speech/cognition: Awake, alert, oriented to history taking and casual conversation CRANIAL NERVES: CN II: Visual fields are full to confrontation. Pupils are round equal and briskly reactive to light. CN III, IV, VI: extraocular movement are normal. No ptosis. CN V: Facial sensation is intact to light touch CN VII: Face is symmetric with normal eye closure  CN VIII: Hearing is normal to causal conversation. CN IX, X: Phonation is normal. CN XI: Head turning and shoulder shrug are intact  MOTOR: Moving 4 extremities without difficulty  SENSORY: intact to light touch  COORDINATION: There is no trunk or limb dysmetria noted.  GAIT/STANCE: She needs push-up to get up from seated position, cautious, able to walk backwards, forward,  unsteady  REVIEW OF SYSTEMS:  Full 14 system review of systems performed and notable only for as above All other review of systems were negative.   ALLERGIES: Allergies  Allergen Reactions   Anesthetics, Amide Other (See Comments)    Patient unsure of names, however multiples cause swelling of airway & nausea   Clindamycin Swelling   Shellfish Allergy Other (See Comments)    Neurotoxic reaction   Sulfonamide Derivatives Anaphylaxis and Swelling   Neomycin Swelling   Zolpidem Tartrate Other (See Comments)    Jerking motions    Azithromycin Other (See Comments)    Severe gastritis    Bactroban Other (See Comments)    Causes sores in nose   Ciprofloxacin     joint swelling   Codeine Swelling   Meperidine Hcl Nausea Only     Hallucinations    Morphine Nausea Only    Hallucinations    Neosporin [Neomycin-Polymyxin-Gramicidin] Hives and Dermatitis    All topical "orin's ointment"   Nitrofurantoin     neuropathy in legs   Penicillins Other (See Comments)    Swelling in joints   Percocet [Oxycodone-Acetaminophen]     Dizziness    Tramadol     Makes jerk   Latex Rash    HOME MEDICATIONS: Current Outpatient Medications  Medication Sig Dispense Refill   acetaminophen (TYLENOL) 500 MG tablet Take 1,000 mg by mouth 2 (two) times daily as needed for moderate pain.     ALPRAZolam (XANAX) 0.25 MG tablet Take 0.25 mg by mouth daily as needed for anxiety.     aspirin EC 81 MG EC tablet Take 1 tablet (81 mg total) by mouth daily.     budesonide (ENTOCORT EC) 3 MG 24 hr capsule Take 3 mg by mouth daily.     Calcium Carbonate-Vitamin D (CALTRATE 600+D PO) Take 1 tablet by mouth daily.     cetirizine (ZYRTEC) 10 MG tablet Take 10 mg  by mouth 2 (two) times daily.     cholecalciferol (VITAMIN D) 1000 UNITS tablet Take 1,000 Units by mouth daily.     cycloSPORINE (RESTASIS) 0.05 % ophthalmic emulsion Place 1 drop into both eyes 2 (two) times daily as needed (dry eyes).     diclofenac Sodium (VOLTAREN) 1 % GEL Apply 1 application  topically daily as needed (pain).     DULoxetine (CYMBALTA) 60 MG capsule Take 60 mg by mouth daily.     estradiol (ESTRING) 2 MG vaginal ring Place 2 mg vaginally every 3 (three) months. follow package directions     fludrocortisone (FLORINEF) 0.1 MG tablet Take 1 tablet (0.1 mg total) by mouth daily. 270 tablet 3   folic acid (FOLVITE) 400 MCG tablet Take 400 mcg by mouth daily.     levothyroxine (SYNTHROID) 50 MCG tablet Take 50 mcg by mouth 3 (three) times a week. Take 50 mcg daily on Tuesday,Thursday, and Sunday     levothyroxine (SYNTHROID) 75 MCG tablet Take 75 mcg by mouth 4 (four) times a week. Pt takes on Monday,Wednesday,Friday, and Saturday     meclizine (ANTIVERT) 25 MG tablet  Take 25 mg by mouth 3 (three) times daily as needed for dizziness.      Melatonin 5 MG CAPS Take 5 mg by mouth at bedtime.     midodrine (PROAMATINE) 10 MG tablet Take 1.5 tablets (15 mg total) by mouth 3 (three) times daily. 405 tablet 2   potassium chloride (KLOR-CON M) 10 MEQ tablet Take 2 tablets (20 mEq total) by mouth daily.     Probiotic Product (ALIGN) 4 MG CAPS Take 4 mg by mouth at bedtime.      pyridostigmine (MESTINON) 60 MG tablet Take 0.5 tablets (30 mg total) by mouth 2 (two) times daily. 90 tablet 3   rosuvastatin (CRESTOR) 10 MG tablet Take 1 tablet (10 mg total) by mouth daily. (Patient taking differently: Take 10 mg by mouth at bedtime.) 90 tablet 0   vitamin B-12 (CYANOCOBALAMIN) 500 MCG tablet Take 500 mcg by mouth daily.     Magnesium 250 MG TABS Take 250 mg by mouth daily. (Patient not taking: Reported on 06/04/2023)     Omega 3 1200 MG CAPS Take 1,200 mg by mouth daily. (Patient not taking: Reported on 06/04/2023)     No current facility-administered medications for this visit.   Facility-Administered Medications Ordered in Other Visits  Medication Dose Route Frequency Provider Last Rate Last Admin   bupivacaine (MARCAINE) 0.5 % 10 mL, triamcinolone acetonide (KENALOG-40) 40 mg injection   Subcutaneous Once Jethro Bolus, MD       bupivacaine (MARCAINE) 0.5 % 15 mL, phenazopyridine (PYRIDIUM) 400 mg bladder mixture   Bladder Instillation Once Jethro Bolus, MD        PAST MEDICAL HISTORY: Past Medical History:  Diagnosis Date   ADHD (attention deficit hyperactivity disorder)    Allergy    trees/pollen, mold, fungus, dust mites. Takes allergy shots   Arthritis    PAIN AND OA LEFT HIP   Asthma    allergist Dr Illene Labrador- monthly allergy injections   Autonomic dysfunction    CENTRAL NERVOUS SYSTEM NEUROPATHY - DX BY DR. Sandria Manly MORE THAN 10 YRS AGO - AND IT IS FELT TO CONTRIBUTE TO THE AUTOMIC  DYSFUNCTION-- PT HAS NUMBNESS LEGS AND FEET AND SOMETIIMES TIPS OF  FINGER, SEVERE CONSTIPATION( NO SENSATION TO HAVE BM ), DOUBLE VISION, ORTHOSTATIC HYPOTENSION   Blood transfusion    Breast cancer (HCC)  right side   Bruises easily    Cataract    Chest pain    a. 12/2012 Cath: nl cors, EF 55-65%.   Complication of anesthesia    reaction to some anesthetics/ 7/12 anesth record on chart- states prefers epidural   CVA (cerebrovascular accident) (HCC) 03/21/2018   Depression    Diverticulosis    Dysrhythmia    HX OF HIGH GRADE HEART BLOCK - REQUIRED PACEMAKER INSERTION   Esophageal stricture    Gastritis    GERD (gastroesophageal reflux disease)    H/O hiatal hernia    Hemorrhoids    Hernia of abdominal wall    spigelian hernia RLQ - SURGERY TO REPAIR   Hypothyroidism    Interstitial cystitis    Latex allergy, contact dermatitis    Mallory - Weiss tear    HEALED    Neuromuscular disorder (HCC)    central nervous system neuropathy- seen per Dr Sandria Manly   Pacemaker    Pericardial effusion    a. 12/2012 following ppm placement;  b. 01/01/2013 Echo: EF 55-60%, small pericardial effusion w/o RV collapse-->No need for tap/window.   PONV (postoperative nausea and vomiting)    pt needs scop patch   Recurrent upper respiratory infection (URI) 1/13- to present   bronchitis following surgery- states improved but still with cough. OV with [facility] Dr [name] 09/06/11 on chart   Shortness of breath    AT TIMES - BUT MUCH IMPROVED AFTER PACEMAKER WAS REPROGRAMED.   Symptomatic bradycardia    a. 12/2012 s/p MDT dual chamber PPM, ser # [id number]; b. 12/2012 post-op course complicated by pericardial effusinon req lead revision.   Thyroid disease     PAST SURGICAL HISTORY: Past Surgical History:  Procedure Laterality Date   ABDOMINAL HYSTERECTOMY  1974   BACK SURGERY     cervical fusion 4-5 with plate   BLADDER SUSPENSION     BREAST BIOPSY  2002   NO BLOOD PRESSURES ON RIGHT SIDE/   s/p  axillary node dissection   CYSTO WITH HYDRODISTENSION  09/07/2011    Procedure: CYSTOSCOPY/HYDRODISTENSION;  Surgeon: Kathi Ludwig, MD;  Location: [facility];  Service: Urology;  Laterality: N/A;  INSTILLATION OF MARCAINE/PYRIDIUM INSTILLATION OF MARCAINE/KENALOG   CYSTOSCOPY  1975, 2007   EYE SURGERY     LASIK EYE SURGERY BILATERAL   LAPAROSCOPY  1973   LEAD REVISION N/A 12/30/2012   Procedure: LEAD REVISION;  Surgeon: Marinus Maw, MD;  Location: [facility] CATH LAB;  Service: Cardiovascular;  Laterality: N/A;   LEFT HEART CATHETERIZATION WITH CORONARY ANGIOGRAM N/A 12/29/2012   Procedure: LEFT HEART CATHETERIZATION WITH CORONARY ANGIOGRAM;  Surgeon: Peter M Swaziland, MD;  Location: [facility] CATH LAB;  Service: Cardiovascular;  Laterality: N/A;   MASTECTOMY MODIFIED RADICAL     right; with immediate reconstruction   MYRINGOPLASTY  1962   NECK SURGERY     c4-5 ruptured disc   OOPHORECTOMY  1982   PACEMAKER INSERTION     PERMANENT PACEMAKER INSERTION N/A 12/29/2012   Procedure: PERMANENT PACEMAKER INSERTION;  Surgeon: Duke Salvia, MD;  Location: [facility] CATH LAB;  Service: Cardiovascular;  Laterality: N/A;   PPM GENERATOR CHANGEOUT N/A 02/27/2023   Procedure: PPM GENERATOR CHANGEOUT;  Surgeon: Duke Salvia, MD;  Location: [facility] INVASIVE CV LAB;  Service: Cardiovascular;  Laterality: N/A;   RECTOCELE REPAIR     with cystocele repair   TONSILLECTOMY     TOTAL HIP ARTHROPLASTY Right    TOTAL HIP ARTHROPLASTY Left 06/12/2013  Procedure: LEFT TOTAL HIP ARTHROPLASTY ANTERIOR APPROACH;  Surgeon: Kathryne Hitch, MD;  Location: [facility];  Service: Orthopedics;  Laterality: Left;   TYMPANOPLASTY  1973   right   ULNAR NERVE TRANSPOSITION Right    VENTRAL HERNIA REPAIR  05/30/2011   Procedure: HERNIA REPAIR VENTRAL ADULT;  Surgeon: Currie Paris, MD;  Location: [facility];  Service: General;  Laterality: Right;  repair right spigelian hernia    FAMILY HISTORY: Family History  Problem Relation Age of Onset   Heart disease Father    Colon cancer  Mother 38   Depression Mother    Pancreatic cancer Sister 69   Diverticulitis Sister    Uterine cancer Maternal Grandmother    Heart disease Brother    Heart disease Paternal Grandmother    Heart disease Paternal Grandfather    Throat cancer Maternal Uncle    Heart disease Paternal Aunt    Heart disease Paternal Uncle    Heart disease Brother    Thrombosis Maternal Aunt    Cancer Maternal Uncle        NOS   Diabetes Other        aunt    SOCIAL HISTORY: Social History   Socioeconomic History   Marital status: Married    Spouse name: Not on file   Number of children: 2   Years of education: 15   Highest education level: Not on file  Occupational History   Occupation: Retired    Associate Professor: [employer]    Comment: vp corporate affairs [employer]    Employer: [employer]  Tobacco Use   Smoking status: Never   Smokeless tobacco: Never  Vaping Use   Vaping status: Never Used  Substance and Sexual Activity   Alcohol use: Yes    Alcohol/week: 7.0 standard drinks of alcohol    Types: 7 Glasses of wine per week    Comment: occasional   Drug use: No   Sexual activity: Not on file  Other Topics Concern   Not on file  Social History Narrative   Lives at home w/ her husband   Patient drinks 1-2 cup of caffeine daily.   Patient is right handed.   Social Drivers of Corporate investment banker Strain: Not on file  Food Insecurity: No Food Insecurity (04/10/2023)   Hunger Vital Sign    Worried About Running Out of Food in the Last Year: Never true    Ran Out of Food in the Last Year: Never true  Transportation Needs: No Transportation Needs (04/10/2023)   PRAPARE - Administrator, Civil Service (Medical): No    Lack of Transportation (Non-Medical): No  Physical Activity: Not on file  Stress: Not on file  Social Connections: Unknown (09/24/2021)   [facility] from [facility]   Social Network    Social Network: Not on  file  Intimate Partner Violence: Not At Risk (04/10/2023)   Humiliation, Afraid, Rape, and Kick questionnaire    Fear of Current or Ex-Partner: No    Emotionally Abused: No    Physically Abused: No    Sexually Abused: No      Levert Feinstein, M.D. Ph.D.  [facility] [address], [address] [place], [place] [address] Ph: [phone] Fax: [phone]  CC:  Marvel Plan, MD [address] [place],  [place] [address]  Geoffry Paradise, MD    Total time spent reviewing the chart, obtaining  history, examined patient, ordering tests, documentation, consultations and family, care coordination was 55 minutes

## 2023-06-04 NOTE — Telephone Encounter (Signed)
Please schedule emg/ncs study soon,  If we can skin biopsy from syn-neucleiopathy CND package the same day.

## 2023-06-04 NOTE — Telephone Encounter (Signed)
 error

## 2023-06-04 NOTE — Patient Instructions (Signed)
The recommended starting dose of NORTHERA is 100 mg, taken orally three times daily: upon arising in the  morning, at midday, and in the late afternoon at least 3 hours prior to bedtime (to reduce the potential for supine  hypertension during sleep). Administer NORTHERA consistently, either with food or without food. Take  NORTHERA capsule whole. Titrate to symptomatic response, in increments of 100 mg three times daily every 24 48 hours up to a maximum dose of 600 mg three times daily (i.e., a maximum total daily dose of 1800 mg).  Monitor supine blood pressure prior to initiating NORTHERA and after increasing the dose.  Patients who miss a dose of NORTHERA should take their next scheduled dose.

## 2023-06-05 NOTE — Telephone Encounter (Signed)
Patient scheduled for 06/10/2023 for 8:30 AM NCV / 9:30 AM EMG.

## 2023-06-09 ENCOUNTER — Encounter: Payer: Self-pay | Admitting: Neurology

## 2023-06-10 ENCOUNTER — Encounter: Payer: Self-pay | Admitting: Internal Medicine

## 2023-06-10 ENCOUNTER — Ambulatory Visit (INDEPENDENT_AMBULATORY_CARE_PROVIDER_SITE_OTHER): Payer: Medicare Other | Admitting: Neurology

## 2023-06-10 ENCOUNTER — Ambulatory Visit (INDEPENDENT_AMBULATORY_CARE_PROVIDER_SITE_OTHER): Payer: Self-pay | Admitting: Neurology

## 2023-06-10 ENCOUNTER — Telehealth: Payer: Self-pay | Admitting: Neurology

## 2023-06-10 ENCOUNTER — Ambulatory Visit: Payer: Medicare Other | Attending: Internal Medicine | Admitting: Internal Medicine

## 2023-06-10 VITALS — BP 81/56 | HR 76 | Ht 67.0 in | Wt 145.0 lb

## 2023-06-10 VITALS — BP 146/81 | HR 75 | Ht 67.0 in

## 2023-06-10 DIAGNOSIS — E43 Unspecified severe protein-calorie malnutrition: Secondary | ICD-10-CM

## 2023-06-10 DIAGNOSIS — R269 Unspecified abnormalities of gait and mobility: Secondary | ICD-10-CM | POA: Diagnosis not present

## 2023-06-10 DIAGNOSIS — Z950 Presence of cardiac pacemaker: Secondary | ICD-10-CM | POA: Diagnosis not present

## 2023-06-10 DIAGNOSIS — G9089 Other disorders of autonomic nervous system: Secondary | ICD-10-CM | POA: Diagnosis not present

## 2023-06-10 DIAGNOSIS — I951 Orthostatic hypotension: Secondary | ICD-10-CM

## 2023-06-10 DIAGNOSIS — I442 Atrioventricular block, complete: Secondary | ICD-10-CM | POA: Diagnosis not present

## 2023-06-10 DIAGNOSIS — R2689 Other abnormalities of gait and mobility: Secondary | ICD-10-CM

## 2023-06-10 DIAGNOSIS — Z0289 Encounter for other administrative examinations: Secondary | ICD-10-CM

## 2023-06-10 MED ORDER — MIDODRINE HCL 10 MG PO TABS: 20.0000 mg | ORAL_TABLET | Freq: Three times a day (TID) | ORAL | Status: DC

## 2023-06-10 NOTE — Telephone Encounter (Signed)
Pt's spouse is asking for a call from RN to discuss the additional the Dr Terrace Arabia wants pt to have.

## 2023-06-10 NOTE — Progress Notes (Signed)
ASSESSMENT AND PLAN  [name] is a 78 y.o. female   Panautonomic failure with subacute worsening since November 2024 Recent diagnosis of Sjogren's disease  Differentiation diagnosis of her autonomic failure including central nervous system degenerative disorder, such as pure autonomic failure, multiple system atrophy, (all Synucleinopath), versus autoimmune, or paraneoplastic mediated, CND skin biopsy for synucleinopath  Laboratory evaluations including paraneoplastic panel, ganglionic acetylcholine receptor autoantibody, .NavigATTR Genetic testing for possible amyloidosis Higher dose of midodrine up to 20 mg 4 times a day, Mestinon 60 mg half tablets twice a day, also on fludrocortisone, new prescription of droxidopa if needed.           DIAGNOSTIC DATA (LABS, IMAGING, TESTING) - I reviewed patient records, labs, notes, testing and imaging myself where available.   MEDICAL HISTORY:  [name] is a 78 year old female, seen in request by neurosurgeon Dr. Barnett Abu for evaluation of low back pain, leg weakness, her primary care physician is Dr. Geoffry Paradise, initial evaluation was on November 10, 2021  I reviewed and summarized the referring note.PMHX. S/p pacemaker. Histroy of right cervical radiculopathy, surgery did help.  Hx of bilateral hip replacement  In October 2022, after lifting a heavy box of books, she noticed low back pain, muscle spasm and lower back pain  She was initially treated with NSAIDs, muscle relaxant with some help  But since then, with prolonged standing, bending over she will develop low back pain, then was given steroid, physical therapy,  She had a history of bilateral hip replacement,  Over the past few months, she developed worsening low back pain, radiating pain to left lateral thigh region, she received injection for left hip bursitis, did not help her symptoms  CT of left hip in February 2023 showed total  arthroplasty without hardware failure or complication  CT lumbar myelogram October 25, 2021 showed lumbar region disc bulging, facet osteoarthritis with mild narrowing of lateral recess at L3-4, L4-5 without definite neural compression,  CT thoracic spine showed chronic compression deformity of T3 no significant canal stenosis  She now complains of significant left lower extremity weakness, when she lying down sitting down no significant pain, when she getting up she complains of significant left leg pain 9 out of 10, radiating to the left lateral leg, deep aching pain at nighttime, has to take frequent over-the-counter medications  UPDATE August 16th 2023: Patient is accompanied by her husband return for electrodiagnostic study today, which showed evidence of mild peripheral neuropathy if anything, the test is also limited due to her lower extremity pitting edema, relative preserved distal lower extremity motor nerve conduction study  EMG nerve conduction study showed no evidence of active lumbar radiculopathy  On further questioning, today she mainly complains of subacute onset of bilateral lower extremity deep achy pain, mainly involving left lateral thigh,  now similar involvement of the right side, also complains of low back pain, upper extremity pain  She was treated with tapering dose of the steroid for 1 week by orthopedic surgeon for left hip pain, she did reported significant improvement afterwards, benefit last for more than 4 weeks, but she could not tolerate the side effect of high-dose of prednisone after 60 mg daily,  She later had few sessions of epidural injection without any benefit  Today she also complains of worsening urinary urgency, to the point of incontinence,  She did have a history of cervical decompression surgery in the past, for severe right cervical radiculopathy, denied gait abnormality  prior to her cervical decompression surgery  UPDATE Sept 5th 2023: She is  accompanied by her husband at today's clinical visit, continues to complains of moderate left lateral thigh pain, right thigh pain, gait abnormality mainly due to limitation from pain  Reviewed laboratory evaluations normal high-sensitivity C-reactive protein ESR, CPK, B12, ANA, slightly low vitamin D 26.2  Reviewed home medication list, she has been on Lexapro 10 mg since age 39, she does suffer depression following total hysterectomy, she denies depression,  Personally reviewed CT cervical spine 09/07/2023status post ACDF C5-6 C6-7, no significant canal foraminal narrowing  CT lumbar myelogram October 25, 2021, multilevel degenerative changes, most obvious degenerative changes worse at L4-5, facet and ligamentous hypertrophy, mild stenosis of lateral recess but without definite neural compression  Husband revealed that she has become very sedentary since 2022, likely deconditioned  Virtual Visit via video August 23 2022:  She was setup for physical therapy following last visit, but shortly after that she was called in to take care of her daughter at [place], who is facing a lots of surgery, hiatal hernia, SI joint fusion, now needing shoulder replacement.  She is very struggling with her condition mentally and physically.  Continue complains of gait abnormality, intermittent body achy pain involving her lower extremity, knees, low back, frequent muscle spasm, sometimes leg gave out underneath her, like a rubber, risk for fall,  Update June 04 2023: She presented to emergency room on April 09, 2023 for significant headache, speech difficulty, dizziness, right-sided weakness, the headache was severe, sharp, at the right periorbital region,  Was diagnosed with complex migraine versus TIA,  Had MRI of the brain, which showed no acute abnormality, moderate atrophy, small vessel disease,  CT angiogram of head and neck showed no large vessel disease  Laboratory evaluation showed normal  CBC, INR, CMP showed low potassium of 3.2, LDL 53, A1c of 5.7  Patient reported ever since then, she has significant worsening of her dizziness, profound orthostatic blood pressure change, fainting when standing up, shortness of breath walking across the room, lack of appetite, 20 pound weight loss over the past few months, loss of the taste,  She no longer have headaches, but profound orthostatic hypotension is very disability eating for her  She has been under the care of cardiologist Dr. Graciela Husbands for many years, who reported that she has insidious onset of orthostatic hypotension since 2015, also had cardiac block required pacing since 2014, previously complicated by microperforation, moderate effusion, then Adderall withdrawal, complete heart block, required pacemaker, generator replacement October 2024, seen by GI specialist for collagenous colitis, presenting with frequent diarrhea,  She also had excessive stress over the past few years, took care of her daughter at [place], who passed away in January 19, 2023 She was started on midodrine as far back as 2015, was on 2.5 mg, significant increase of dosage up to 50 mg 3 times a day with limited help, also on Mestinon since October 2024, 60 mg trigger significant GI side effect, could only tolerate 30 mg twice a day, fludrocortisone 0.1 mg daily since October  UPDATE Jan 27th 2025:  I reviewed also discussed with her cardiologist Dr. Graciela Husbands  She has long history of gradual onset autonomic symptoms, mild progressive worsening orthostatic hypotension since 2015, cardiac block, required pacemaker,  Patient also reported a diagnosis of collagenous colitis under the care of of [facility] specialist, presenting with watery diarrhea, intermixed with occasionally constipation,  She has been treated with budesonide for  extended period of time, with resolution of her symptoms, Long history of decreased sweaty, heat intolerance, dry eye, dry mouth In addition,  she reported that her daughter who has passed away in fall of 22-Jul-2022 has a lot of similar symptoms Her history is suggestive of panautonomic failure, involving sympathetic noradrenergic, sympathetic cholinergic, likely parasympathetic involvement as well Etiology including Central nervous system degenerative disorder such as pure autonomic failure, versus multiple system atrophy, with her reported history of acute worsening since October 2024, raise the possibility of autoimmune process, also need to rule out amyloid deposit  Planning on: 1.CND skin biopsy for synucleinopath 2.  Laboratory evaluations were ", including paraneoplastic panel, ganglionic acetylcholine receptor autoantibody, 3.NavigATTR Genetic testing for possible amyloidosis 4. She is on higher dose of midodrine up to 20 mg 4 times a day, Mestinon 60 mg half tablets twice a day, also on fludrocortisone, new prescription of droxidopa  PHYSICAL EXAM:     06/10/2023    8:32 AM 06/04/2023    4:17 PM 05/29/2023    2:14 PM  Vitals with BMI  Height 5\' 7"  5\' 7"    Weight  148 lbs   BMI  23.17   Systolic 146 142 86  Diastolic 81 86 58  Pulse 75 72 86    Lying 180/83, HR64,  sitting 149/85, HR 65, Standing up: 96/58, HR 78  MENTAL STATUS: Speech/cognition: Awake, alert, oriented to history taking and casual conversation CRANIAL NERVES: CN II: Visual fields are full to confrontation. Pupils are round equal and briskly reactive to light. CN III, IV, VI: extraocular movement are normal. No ptosis. CN V: Facial sensation is intact to light touch CN VII: Face is symmetric with normal eye closure  CN VIII: Hearing is normal to causal conversation. CN IX, X: Phonation is normal. CN XI: Head turning and shoulder shrug are intact  MOTOR: Moving 4 extremities without difficulty  SENSORY: intact to light touch  COORDINATION: There is no trunk or limb dysmetria noted.  GAIT/STANCE: She needs push-up to get up from seated position,  cautious, able to walk backwards, forward,  unsteady  REVIEW OF SYSTEMS:  Full 14 system review of systems performed and notable only for as above All other review of systems were negative.   ALLERGIES: Allergies  Allergen Reactions   Anesthetics, Amide Other (See Comments)    Patient unsure of names, however multiples cause swelling of airway & nausea   Clindamycin Swelling   Shellfish Allergy Other (See Comments)    Neurotoxic reaction   Sulfonamide Derivatives Anaphylaxis and Swelling   Neomycin Swelling   Zolpidem Tartrate Other (See Comments)    Jerking motions    Azithromycin Other (See Comments)    Severe gastritis    Bactroban Other (See Comments)    Causes sores in nose   Ciprofloxacin     joint swelling   Codeine Swelling   Meperidine Hcl Nausea Only    Hallucinations    Morphine Nausea Only    Hallucinations    Neosporin [Neomycin-Polymyxin-Gramicidin] Hives and Dermatitis    All topical "orin's ointment"   Nitrofurantoin     neuropathy in legs   Penicillins Other (See Comments)    Swelling in joints   Percocet [Oxycodone-Acetaminophen]     Dizziness    Tramadol     Makes jerk   Latex Rash    HOME MEDICATIONS: Current Outpatient Medications  Medication Sig Dispense Refill   acetaminophen (TYLENOL) 500 MG tablet Take 1,000 mg by mouth 2 (two)  times daily as needed for moderate pain.     ALPRAZolam (XANAX) 0.25 MG tablet Take 0.25 mg by mouth daily as needed for anxiety.     aspirin EC 81 MG EC tablet Take 1 tablet (81 mg total) by mouth daily.     budesonide (ENTOCORT EC) 3 MG 24 hr capsule Take 3 mg by mouth daily.     Calcium Carbonate-Vitamin D (CALTRATE 600+D PO) Take 1 tablet by mouth daily.     cetirizine (ZYRTEC) 10 MG tablet Take 10 mg by mouth 2 (two) times daily.     cholecalciferol (VITAMIN D) 1000 UNITS tablet Take 1,000 Units by mouth daily.     cycloSPORINE (RESTASIS) 0.05 % ophthalmic emulsion Place 1 drop into both eyes 2 (two) times  daily as needed (dry eyes).     diclofenac Sodium (VOLTAREN) 1 % GEL Apply 1 application  topically daily as needed (pain).     droxidopa (NORTHERA) 100 MG CAPS Take 3 capsules (300 mg total) by mouth 3 (three) times daily with meals. 270 capsule 11   DULoxetine (CYMBALTA) 60 MG capsule Take 60 mg by mouth daily.     estradiol (ESTRING) 2 MG vaginal ring Place 2 mg vaginally every 3 (three) months. follow package directions     fludrocortisone (FLORINEF) 0.1 MG tablet Take 1 tablet (0.1 mg total) by mouth daily. 270 tablet 3   folic acid (FOLVITE) 400 MCG tablet Take 400 mcg by mouth daily.     levothyroxine (SYNTHROID) 50 MCG tablet Take 50 mcg by mouth 3 (three) times a week. Take 50 mcg daily on Tuesday,Thursday, and Sunday     levothyroxine (SYNTHROID) 75 MCG tablet Take 75 mcg by mouth 4 (four) times a week. Pt takes on Monday,Wednesday,Friday, and Saturday     Magnesium 250 MG TABS Take 250 mg by mouth daily. (Patient not taking: Reported on 06/04/2023)     meclizine (ANTIVERT) 25 MG tablet Take 25 mg by mouth 3 (three) times daily as needed for dizziness.      Melatonin 5 MG CAPS Take 5 mg by mouth at bedtime.     midodrine (PROAMATINE) 10 MG tablet Take 1.5 tablets (15 mg total) by mouth 3 (three) times daily. 405 tablet 2   Omega 3 1200 MG CAPS Take 1,200 mg by mouth daily. (Patient not taking: Reported on 06/04/2023)     potassium chloride (KLOR-CON M) 10 MEQ tablet Take 2 tablets (20 mEq total) by mouth daily.     Probiotic Product (ALIGN) 4 MG CAPS Take 4 mg by mouth at bedtime.      pyridostigmine (MESTINON) 60 MG tablet Take 0.5 tablets (30 mg total) by mouth 2 (two) times daily. 90 tablet 3   rosuvastatin (CRESTOR) 10 MG tablet Take 1 tablet (10 mg total) by mouth daily. (Patient taking differently: Take 10 mg by mouth at bedtime.) 90 tablet 0   vitamin B-12 (CYANOCOBALAMIN) 500 MCG tablet Take 500 mcg by mouth daily.     No current facility-administered medications for this visit.    Facility-Administered Medications Ordered in Other Visits  Medication Dose Route Frequency Provider Last Rate Last Admin   bupivacaine (MARCAINE) 0.5 % 10 mL, triamcinolone acetonide (KENALOG-40) 40 mg injection   Subcutaneous Once Jethro Bolus, MD       bupivacaine (MARCAINE) 0.5 % 15 mL, phenazopyridine (PYRIDIUM) 400 mg bladder mixture   Bladder Instillation Once Jethro Bolus, MD        PAST MEDICAL HISTORY: Past Medical History:  Diagnosis Date   ADHD (attention deficit hyperactivity disorder)    Allergy    trees/pollen, mold, fungus, dust mites. Takes allergy shots   Arthritis    PAIN AND OA LEFT HIP   Asthma    allergist Dr Illene Labrador- monthly allergy injections   Autonomic dysfunction    CENTRAL NERVOUS SYSTEM NEUROPATHY - DX BY DR. Sandria Manly MORE THAN 10 YRS AGO - AND IT IS FELT TO CONTRIBUTE TO THE AUTOMIC  DYSFUNCTION-- PT HAS NUMBNESS LEGS AND FEET AND SOMETIIMES TIPS OF FINGER, SEVERE CONSTIPATION( NO SENSATION TO HAVE BM ), DOUBLE VISION, ORTHOSTATIC HYPOTENSION   Blood transfusion    Breast cancer (HCC)    right side   Bruises easily    Cataract    Chest pain    a. 12/2012 Cath: nl cors, EF 55-65%.   Complication of anesthesia    reaction to some anesthetics/ 7/12 anesth record on chart- states prefers epidural   CVA (cerebrovascular accident) (HCC) 03/21/2018   Depression    Diverticulosis    Dysrhythmia    HX OF HIGH GRADE HEART BLOCK - REQUIRED PACEMAKER INSERTION   Esophageal stricture    Gastritis    GERD (gastroesophageal reflux disease)    H/O hiatal hernia    Hemorrhoids    Hernia of abdominal wall    spigelian hernia RLQ - SURGERY TO REPAIR   Hypothyroidism    Interstitial cystitis    Latex allergy, contact dermatitis    Mallory - Weiss tear    HEALED    Neuromuscular disorder (HCC)    central nervous system neuropathy- seen per Dr Sandria Manly   Pacemaker    Pericardial effusion    a. 12/2012 following ppm placement;  b. 01/01/2013 Echo: EF 55-60%,  small pericardial effusion w/o RV collapse-->No need for tap/window.   PONV (postoperative nausea and vomiting)    pt needs scop patch   Recurrent upper respiratory infection (URI) 1/13- to present   bronchitis following surgery- states improved but still with cough. OV with [facility] Dr [name] 09/06/11 on chart   Shortness of breath    AT TIMES - BUT MUCH IMPROVED AFTER PACEMAKER WAS REPROGRAMED.   Symptomatic bradycardia    a. 12/2012 s/p MDT dual chamber PPM, ser # [id number]; b. 12/2012 post-op course complicated by pericardial effusinon req lead revision.   Thyroid disease     PAST SURGICAL HISTORY: Past Surgical History:  Procedure Laterality Date   ABDOMINAL HYSTERECTOMY  1974   BACK SURGERY     cervical fusion 4-5 with plate   BLADDER SUSPENSION     BREAST BIOPSY  2002   NO BLOOD PRESSURES ON RIGHT SIDE/   s/p  axillary node dissection   CYSTO WITH HYDRODISTENSION  09/07/2011   Procedure: CYSTOSCOPY/HYDRODISTENSION;  Surgeon: Kathi Ludwig, MD;  Location: [facility];  Service: Urology;  Laterality: N/A;  INSTILLATION OF MARCAINE/PYRIDIUM INSTILLATION OF MARCAINE/KENALOG   CYSTOSCOPY  1975, 2007   EYE SURGERY     LASIK EYE SURGERY BILATERAL   LAPAROSCOPY  1973   LEAD REVISION N/A 12/30/2012   Procedure: LEAD REVISION;  Surgeon: Marinus Maw, MD;  Location: [facility] CATH LAB;  Service: Cardiovascular;  Laterality: N/A;   LEFT HEART CATHETERIZATION WITH CORONARY ANGIOGRAM N/A 12/29/2012   Procedure: LEFT HEART CATHETERIZATION WITH CORONARY ANGIOGRAM;  Surgeon: Peter M Swaziland, MD;  Location: [facility] CATH LAB;  Service: Cardiovascular;  Laterality: N/A;   MASTECTOMY MODIFIED RADICAL     right; with immediate reconstruction   MYRINGOPLASTY  1962   NECK SURGERY     c4-5 ruptured disc   OOPHORECTOMY  1982   PACEMAKER INSERTION     PERMANENT PACEMAKER INSERTION N/A 12/29/2012   Procedure: PERMANENT PACEMAKER INSERTION;  Surgeon: [facility] Salvia, MD;  Location: [facility] CATH LAB;  Service:  Cardiovascular;  Laterality: N/A;   PPM GENERATOR CHANGEOUT N/A 02/27/2023   Procedure: PPM GENERATOR CHANGEOUT;  Surgeon: [facility] Salvia, MD;  Location: [facility] INVASIVE CV LAB;  Service: Cardiovascular;  Laterality: N/A;   RECTOCELE REPAIR     with cystocele repair   TONSILLECTOMY     TOTAL HIP ARTHROPLASTY Right    TOTAL HIP ARTHROPLASTY Left 06/12/2013   Procedure: LEFT TOTAL HIP ARTHROPLASTY ANTERIOR APPROACH;  Surgeon: Kathryne Hitch, MD;  Location: [facility];  Service: Orthopedics;  Laterality: Left;   TYMPANOPLASTY  1973   right   ULNAR NERVE TRANSPOSITION Right    VENTRAL HERNIA REPAIR  05/30/2011   Procedure: HERNIA REPAIR VENTRAL ADULT;  Surgeon: Currie Paris, MD;  Location: [facility];  Service: General;  Laterality: Right;  repair right spigelian hernia    FAMILY HISTORY: Family History  Problem Relation Age of Onset   Heart disease Father    Colon cancer Mother 67   Depression Mother    Pancreatic cancer Sister 26   Diverticulitis Sister    Uterine cancer Maternal Grandmother    Heart disease Brother    Heart disease Paternal Grandmother    Heart disease Paternal Grandfather    Throat cancer Maternal Uncle    Heart disease Paternal Aunt    Heart disease Paternal Uncle    Heart disease Brother    Thrombosis Maternal Aunt    Cancer Maternal Uncle        NOS   Diabetes Other        aunt    SOCIAL HISTORY: Social History   Socioeconomic History   Marital status: Married    Spouse name: Not on file   Number of children: 2   Years of education: 15   Highest education level: Not on file  Occupational History   Occupation: Retired    Associate Professor: [employer]    Comment: vp corporate affairs [employer]    Employer: [employer]  Tobacco Use   Smoking status: Never   Smokeless tobacco: Never  Vaping Use   Vaping status: Never Used  Substance and Sexual Activity   Alcohol use: Yes    Alcohol/week: 7.0 standard  drinks of alcohol    Types: 7 Glasses of wine per week    Comment: occasional   Drug use: No   Sexual activity: Not on file  Other Topics Concern   Not on file  Social History Narrative   Lives at home w/ her husband   Patient drinks 1-2 cup of caffeine daily.   Patient is right handed.   Social Drivers of Corporate investment banker Strain: Not on file  Food Insecurity: No Food Insecurity (04/10/2023)   Hunger Vital Sign    Worried About Running Out of Food in the Last Year: Never true    Ran Out of Food in the Last Year: Never true  Transportation Needs: No Transportation Needs (04/10/2023)   PRAPARE - Administrator, Civil Service (Medical): No    Lack of Transportation (Non-Medical): No  Physical Activity: Not on file  Stress: Not on file  Social Connections: Unknown (09/24/2021)   Received  from [facility], [facility]   Social Network    Social Network: Not on file  Intimate Partner Violence: Not At Risk (04/10/2023)   Humiliation, Afraid, Rape, and Kick questionnaire    Fear of Current or Ex-Partner: No    Emotionally Abused: No    Physically Abused: No    Sexually Abused: No      Levert Feinstein, M.D. Ph.D.  [facility] [address] [place], [place] [address] Ph: [phone] Fax: [phone]  CC:  Geoffry Paradise, MD [address] [place],  [place] [address]  Geoffry Paradise, MD

## 2023-06-10 NOTE — Patient Instructions (Signed)
Medication Instructions:  Your physician has recommended you make the following change in your medication:   ** Increase Midodrine 10mg  to 2 tablets (20mg ) by mouth 3 times daily  *If you need a refill on your cardiac medications before your next appointment, please call your pharmacy*   Lab Work: None ordered.  If you have labs (blood work) drawn today and your tests are completely normal, you will receive your results only by: MyChart Message (if you have MyChart) OR A paper copy in the mail If you have any lab test that is abnormal or we need to change your treatment, we will call you to review the results.   Testing/Procedures: None ordered.    Follow-Up: At [facility], you and your health needs are our priority.  As part of our continuing mission to provide you with exceptional heart care, we have created designated Provider Care Teams.  These Care Teams include your primary Cardiologist (physician) and Advanced Practice Providers (APPs -  Physician Assistants and Nurse Practitioners) who all work together to provide you with the care you need, when you need it.  We recommend signing up for the patient portal called "MyChart".  Sign up information is provided on this After Visit Summary.  MyChart is used to connect with patients for Virtual Visits (Telemedicine).  Patients are able to view lab/test results, encounter notes, upcoming appointments, etc.  Non-urgent messages can be sent to your provider as well.   To learn more about what you can do with MyChart, go to [email or web address].    Your next appointment:   6 months with Dr Graciela Husbands

## 2023-06-10 NOTE — Progress Notes (Signed)
A and a and     Patient Care Team: Geoffry Paradise, MD as PCP - General (Internal Medicine) Duke Salvia, MD as PCP - Electrophysiology (Cardiology) Milagros Evener, MD as Consulting Physician (Psychiatry)   HPI  [name] is a 78 y.o. female Seen in followup for high grade heart block for which she underwent pacing (2014 Medtronic )compllicated by microperforation modest effusion and then adderall withdrawal; now with complete heart block. Gen replacement 10/24    She hae been followed at [facility] with Abran [facility]. She was last seen 6/19 Salt and proamatine were again recommended with up titration of her dose to 4 times a day.    Intra currently seen by neurology with a diagnosis of acute autonomic failure and droxidopa was initiated; lack of heart rate response with a 60 mm drop in blood pressure consistent with this diagnosis  Had been caring for her daughter I think in [place].  Recently died  Worsening and  severe problems with orthostasis     Seen in the emergency room 11/24 for possible stroke ultimate diagnosis with complex migraine.  She went home was bedbound for 4 days with recurrent syncope upon standing the symptoms have abated gradually.  Intra currently she has seen neurology who is working her up for causes of autonomic failure and are interested in putting her on droxidopa  She reminded that she has a diagnosis of central polyneuropathy;        DATE TEST EF   9/14 Echo   55-60 %   12/19 Echo   55-60 %   2/21 CTA  CaScore 2 Min CAD  7/21 Echo  50-55% Pericardial effusion Trivial    Date Cr K Hgb  6/20 0.8 4.0 13.8                  Past Medical History:  Diagnosis Date   ADHD (attention deficit hyperactivity disorder)    Allergy    trees/pollen, mold, fungus, dust mites. Takes allergy shots   Arthritis    PAIN AND OA LEFT HIP   Asthma    allergist Dr Illene Labrador- monthly allergy injections   Autonomic dysfunction    CENTRAL NERVOUS  SYSTEM NEUROPATHY - DX BY DR. Sandria Manly MORE THAN 10 YRS AGO - AND IT IS FELT TO CONTRIBUTE TO THE AUTOMIC  DYSFUNCTION-- PT HAS NUMBNESS LEGS AND FEET AND SOMETIIMES TIPS OF FINGER, SEVERE CONSTIPATION( NO SENSATION TO HAVE BM ), DOUBLE VISION, ORTHOSTATIC HYPOTENSION   Blood transfusion    Breast cancer (HCC)    right side   Bruises easily    Cataract    Chest pain    a. 12/2012 Cath: nl cors, EF 55-65%.   Complication of anesthesia    reaction to some anesthetics/ 7/12 anesth record on chart- states prefers epidural   CVA (cerebrovascular accident) (HCC) 03/21/2018   Depression    Diverticulosis    Dysrhythmia    HX OF HIGH GRADE HEART BLOCK - REQUIRED PACEMAKER INSERTION   Esophageal stricture    Gastritis    GERD (gastroesophageal reflux disease)    H/O hiatal hernia    Hemorrhoids    Hernia of abdominal wall    spigelian hernia RLQ - SURGERY TO REPAIR   Hypothyroidism    Interstitial cystitis    Latex allergy, contact dermatitis    Mallory - Weiss tear    HEALED    Neuromuscular disorder (HCC)    central nervous system neuropathy- seen per Dr Sandria Manly  Pacemaker    Pericardial effusion    a. 12/2012 following ppm placement;  b. 01/01/2013 Echo: EF 55-60%, small pericardial effusion w/o RV collapse-->No need for tap/window.   PONV (postoperative nausea and vomiting)    pt needs scop patch   Recurrent upper respiratory infection (URI) 1/13- to present   bronchitis following surgery- states improved but still with cough. OV with [facility] Dr [name] 09/06/11 on chart   Shortness of breath    AT TIMES - BUT MUCH IMPROVED AFTER PACEMAKER WAS REPROGRAMED.   Symptomatic bradycardia    a. 12/2012 s/p MDT dual chamber PPM, ser # [id number]; b. 12/2012 post-op course complicated by pericardial effusinon req lead revision.   Thyroid disease     Past Surgical History:  Procedure Laterality Date   ABDOMINAL HYSTERECTOMY  1974   BACK SURGERY     cervical fusion 4-5 with plate   BLADDER  SUSPENSION     BREAST BIOPSY  2002   NO BLOOD PRESSURES ON RIGHT SIDE/   s/p  axillary node dissection   CYSTO WITH HYDRODISTENSION  09/07/2011   Procedure: CYSTOSCOPY/HYDRODISTENSION;  Surgeon: Kathi Ludwig, MD;  Location: [facility];  Service: Urology;  Laterality: N/A;  INSTILLATION OF MARCAINE/PYRIDIUM INSTILLATION OF MARCAINE/KENALOG   CYSTOSCOPY  1975, 2007   EYE SURGERY     LASIK EYE SURGERY BILATERAL   LAPAROSCOPY  1973   LEAD REVISION N/A 12/30/2012   Procedure: LEAD REVISION;  Surgeon: Marinus Maw, MD;  Location: [facility] CATH LAB;  Service: Cardiovascular;  Laterality: N/A;   LEFT HEART CATHETERIZATION WITH CORONARY ANGIOGRAM N/A 12/29/2012   Procedure: LEFT HEART CATHETERIZATION WITH CORONARY ANGIOGRAM;  Surgeon: Peter M Swaziland, MD;  Location: [facility] CATH LAB;  Service: Cardiovascular;  Laterality: N/A;   MASTECTOMY MODIFIED RADICAL     right; with immediate reconstruction   MYRINGOPLASTY  1962   NECK SURGERY     c4-5 ruptured disc   OOPHORECTOMY  1982   PACEMAKER INSERTION     PERMANENT PACEMAKER INSERTION N/A 12/29/2012   Procedure: PERMANENT PACEMAKER INSERTION;  Surgeon: [facility] Salvia, MD;  Location: [facility] CATH LAB;  Service: Cardiovascular;  Laterality: N/A;   PPM GENERATOR CHANGEOUT N/A 02/27/2023   Procedure: PPM GENERATOR CHANGEOUT;  Surgeon: [facility] Salvia, MD;  Location: [facility] INVASIVE CV LAB;  Service: Cardiovascular;  Laterality: N/A;   RECTOCELE REPAIR     with cystocele repair   TONSILLECTOMY     TOTAL HIP ARTHROPLASTY Right    TOTAL HIP ARTHROPLASTY Left 06/12/2013   Procedure: LEFT TOTAL HIP ARTHROPLASTY ANTERIOR APPROACH;  Surgeon: Kathryne Hitch, MD;  Location: [facility];  Service: Orthopedics;  Laterality: Left;   TYMPANOPLASTY  1973   right   ULNAR NERVE TRANSPOSITION Right    VENTRAL HERNIA REPAIR  05/30/2011   Procedure: HERNIA REPAIR VENTRAL ADULT;  Surgeon: Currie Paris, MD;  Location: [facility];  Service: General;  Laterality:  Right;  repair right spigelian hernia   Current Meds  Medication Sig   acetaminophen (TYLENOL) 500 MG tablet Take 1,000 mg by mouth 2 (two) times daily as needed for moderate pain.   ALPRAZolam (XANAX) 0.25 MG tablet Take 0.25 mg by mouth daily as needed for anxiety.   aspirin EC 81 MG EC tablet Take 1 tablet (81 mg total) by mouth daily.   budesonide (ENTOCORT EC) 3 MG 24 hr capsule Take 3 mg by mouth daily.   Calcium Carbonate-Vitamin D (CALTRATE 600+D PO) Take 1 tablet by  mouth daily.   cetirizine (ZYRTEC) 10 MG tablet Take 10 mg by mouth daily.   Cholecalciferol (VITAMIN D) 50 MCG (2000 UT) CAPS Take 2,000 Units by mouth daily.   cycloSPORINE (RESTASIS) 0.05 % ophthalmic emulsion Place 1 drop into both eyes 2 (two) times daily as needed (dry eyes).   diclofenac Sodium (VOLTAREN) 1 % GEL Apply 1 application  topically daily as needed (pain).   droxidopa (NORTHERA) 100 MG CAPS Take 3 capsules (300 mg total) by mouth 3 (three) times daily with meals.   DULoxetine (CYMBALTA) 60 MG capsule Take 60 mg by mouth daily.   estradiol (ESTRING) 2 MG vaginal ring Place 2 mg vaginally every 3 (three) months. follow package directions   fludrocortisone (FLORINEF) 0.1 MG tablet Take 1 tablet (0.1 mg total) by mouth daily.   folic acid (FOLVITE) 400 MCG tablet Take 400 mcg by mouth daily.   levothyroxine (SYNTHROID) 50 MCG tablet Take 50 mcg by mouth 3 (three) times a week. Take 50 mcg daily on Tuesday,Thursday, and Sunday   levothyroxine (SYNTHROID) 75 MCG tablet Take 75 mcg by mouth 4 (four) times a week. Pt takes on Monday,Wednesday,Friday, and Saturday   Magnesium 250 MG TABS Take 250 mg by mouth daily.   meclizine (ANTIVERT) 25 MG tablet Take 25 mg by mouth 3 (three) times daily as needed for dizziness.    Melatonin 5 MG CAPS Take 5 mg by mouth at bedtime.   Omega 3 1200 MG CAPS Take 1,200 mg by mouth daily.   potassium chloride (KLOR-CON M) 10 MEQ tablet Take 2 tablets (20 mEq total) by mouth daily.    Probiotic Product (ALIGN) 4 MG CAPS Take 4 mg by mouth at bedtime.    pyridostigmine (MESTINON) 60 MG tablet Take 0.5 tablets (30 mg total) by mouth 2 (two) times daily.   rosuvastatin (CRESTOR) 10 MG tablet Take 1 tablet (10 mg total) by mouth daily. (Patient taking differently: Take 10 mg by mouth at bedtime.)   vitamin B-12 (CYANOCOBALAMIN) 500 MCG tablet Take 500 mcg by mouth daily.   [DISCONTINUED] midodrine (PROAMATINE) 10 MG tablet Take 1.5 tablets (15 mg total) by mouth 3 (three) times daily.     Allergies  Allergen Reactions   Anesthetics, Amide Other (See Comments)    Patient unsure of names, however multiples cause swelling of airway & nausea   Clindamycin Swelling   Shellfish Allergy Other (See Comments)    Neurotoxic reaction   Sulfonamide Derivatives Anaphylaxis and Swelling   Neomycin Swelling   Zolpidem Tartrate Other (See Comments)    Jerking motions    Azithromycin Other (See Comments)    Severe gastritis    Bactroban Other (See Comments)    Causes sores in nose   Ciprofloxacin     joint swelling   Codeine Swelling   Meperidine Hcl Nausea Only    Hallucinations    Morphine Nausea Only    Hallucinations    Neosporin [Neomycin-Polymyxin-Gramicidin] Hives and Dermatitis    All topical "orin's ointment"   Nitrofurantoin     neuropathy in legs   Penicillins Other (See Comments)    Swelling in joints   Percocet [Oxycodone-Acetaminophen]     Dizziness    Tramadol     Makes jerk   Latex Rash    Review of Systems negative except from HPI and PMH  Physical Exam BP (!) 81/56 Comment: standing at @ mins  Pulse 76   Ht 5\' 7"  (1.702 m)   Wt 145 lb (  65.8 kg)   SpO2 96%   BMI 22.71 kg/m  Well developed and well nourished in no acute distress HENT normal Neck supple Clear Regular  rhythm,   No Clubbing cyanosis  edema Skin-warm and dry A & Oriented  Grossly normal sensory and motor function  ECG sinus P-synchronous/ AV  pacing      Assessment and   Plan  Complete heart block  Pacemaker-Medtronic   Exercise associated hypotension   Dysautonomia with evidence of autonomic failure--orthostatic hypotension without heart rate response  Central polyneuropathy  Hiatal hernia  Grief  Tolerated device generator replacement.  She is continues to struggle with worsening orthostatic intolerance with evidence of autonomic failure with the lack of heart rate response to changes in blood pressure.  She is seen neurology who is working her up for primary causes, I wonder however given the fact that this has been gradually progressing for a decade or more   We have discussed the physiology of autonomic failure she certainly has evidence of other system involvement but does not necessarily to my limited understanding have motor issues.  If that is the case, I have reached out to Dr. Debarah Crape for clarification, pure autonomic failure has a less malignant prognosis than multisystem atrophy.  For now we will increase her her ProAmatine to 20 mg 3 times a day.  We discussed fludrocortisone and with her 80 to 100 mm drop in systolic blood pressure, trying to keep her systolic pressures during the day in the 1 60-1 80 range I think improved the likelihood that she will be able to stand up.  To try to mitigate nocturnal hypertension, have recommended they raise the head of the bed 4 inches.  Depending how often she gets up at night there may also be a role for nocturnal hydralazine.  Discussed again compression with a target of 30 mm

## 2023-06-10 NOTE — Procedures (Signed)
Full Name: [name] Gender: Female MRN #: [id number] Date of Birth: Sep 22, 1945    Visit Date: 06/10/2023 08:31 Age: 78 Years Examining Physician: Dr. Levert Feinstein Referring Physician: Dr. Levert Feinstein Height: 5 feet 7 inch History: 78 year old female with acute worsening of orthostatic hypotension, lower extremity deep achy pain  Summary of the test:  Nerve conduction study: Bilateral sural, superficial peroneal sensory responses were normal.  Right peroneal to EDB and tibial motor responses were normal.  Left peroneal to EDB motor response was absent.  Electromyography: Selected needle examinations of right lower extremity muscles, right lumbosacral paraspinal muscles showed no significant abnormality.  Conclusion: This is a slight abnormal study, there is electrodiagnostic evidence of left peroneal branch to EDB neuropathy.  There is no evidence of large fiber peripheral neuropathy.    Levert Feinstein. M.D. Ph.D.   [facility] [address] [place], [place] [address] Tel: [phone] Fax: [phone]  Verbal informed consent was obtained from the patient, patient was informed of potential risk of procedure, including bruising, bleeding, hematoma formation, infection, muscle weakness, muscle pain, numbness, among others.        MNC    Nerve / Sites Muscle Latency Ref. Amplitude Ref. Rel Amp Segments Distance Velocity Ref. Area    ms ms mV mV %  cm m/s m/s mVms  R Peroneal - EDB     Ankle EDB 4.7 <=6.5 1.4 >=2.0 100 Ankle - EDB 9   3.8     Fib head EDB 14.8  1.3  95.2 Fib head - Ankle 29 29 >=44 4.1     Pop fossa EDB 17.6  0.6  45.3 Pop fossa - Fib head 10 35 >=44 1.3         Pop fossa - Ankle      L Peroneal - EDB     Ankle EDB NR <=6.5 NR >=2.0 NR Ankle - EDB 9   NR         Pop fossa - Ankle      R Tibial - AH     Ankle AH 4.9 <=5.8 5.2 >=4.0 100 Ankle - AH 9   19.3     Pop fossa AH 14.0  4.1  78.2 Pop fossa - Ankle 38 42 >=41 12.8   L Tibial - AH     Ankle AH 5.0 <=5.8 5.7 >=4.0 100 Ankle - AH 9   17.4     Pop fossa AH 14.5  3.8  66.7 Pop fossa - Ankle 39 41 >=41 14.1             SNC    Nerve / Sites Rec. Site Peak Lat Ref.  Amp Ref. Segments Distance    ms ms V V  cm  R Sural - Ankle (Calf)     Calf Ankle 2.7 <=4.4 8 >=6 Calf - Ankle 9  L Sural - Ankle (Calf)     Calf Ankle 2.8 <=4.4 10 >=6 Calf - Ankle 8  R Superficial peroneal - Ankle     Lat leg Ankle 2.8 <=4.4 11 >=6 Lat leg - Ankle 10  L Superficial peroneal - Ankle     Lat leg Ankle 3.2 <=4.4 4 >=6 Lat leg - Ankle 12             F  Wave    Nerve F Lat Ref.   ms ms  R Tibial - AH 54.8 <=56.0  L Tibial -  AH 54.8 <=56.0         EMG Summary Table    Spontaneous MUAP Recruitment  Muscle IA Fib PSW Fasc Other Amp Dur. Poly Pattern  R. Tibialis anterior Normal None None None _______ Normal Normal Normal Normal  R. Tibialis posterior Normal None None None _______ Normal Normal Normal Normal  R. Peroneus longus Normal None None None _______ Normal Normal Normal Normal  R. Gastrocnemius (Medial head) Normal None None None _______ Normal Normal Normal Normal  R. Vastus lateralis Normal None None None _______ Normal Normal Normal Normal  R. Abductor hallucis Normal None None None _______ Normal Normal Normal Normal  R. Lumbar paraspinals (low) Normal None None None _______ Normal Normal Normal Normal  R. Lumbar paraspinals (mid) Normal None None None _______ Normal Normal Normal Normal

## 2023-06-11 ENCOUNTER — Telehealth: Payer: Self-pay | Admitting: Internal Medicine

## 2023-06-11 NOTE — Progress Notes (Signed)
EMG nerve conduction studies under procedure tab

## 2023-06-11 NOTE — Telephone Encounter (Signed)
Pt c/o medication issue:  1. Name of Medication:  potassium chloride (KLOR-CON M) 10 MEQ tablet  2. How are you currently taking this medication (dosage and times per day)?   3. Are you having a reaction (difficulty breathing--STAT)?   4. What is your medication issue?   Patient states during her appointment yesterday Dr. Graciela Husbands advised to call back to provide dose for Klor-Con. She states she has 10 MEQ. Patient doesn't know the medication instructions.

## 2023-06-12 ENCOUNTER — Encounter: Payer: Self-pay | Admitting: Neurology

## 2023-06-12 ENCOUNTER — Encounter: Payer: Self-pay | Admitting: Internal Medicine

## 2023-06-13 ENCOUNTER — Encounter: Payer: Self-pay | Admitting: Internal Medicine

## 2023-06-13 ENCOUNTER — Institutional Professional Consult (permissible substitution): Payer: Medicare Other | Admitting: Neurology

## 2023-06-13 DIAGNOSIS — F4321 Adjustment disorder with depressed mood: Secondary | ICD-10-CM | POA: Diagnosis not present

## 2023-06-13 MED ORDER — MIDODRINE HCL 10 MG PO TABS: 20.0000 mg | ORAL_TABLET | Freq: Four times a day (QID) | ORAL | 3 refills | Status: DC

## 2023-06-13 MED ORDER — POTASSIUM CHLORIDE CRYS ER 10 MEQ PO TBCR: 20.0000 meq | EXTENDED_RELEASE_TABLET | Freq: Every day | ORAL | 3 refills | Status: DC

## 2023-06-13 NOTE — Telephone Encounter (Signed)
I called patient, also talked extensively with her cardiologist Dr. Graciela Husbands on June 12, 2023, She has long history of gradual onset autonomic symptoms, mild progressive worsening orthostatic hypotension since 06/18/2013, cardiac block, required pacemaker,  Patient also reported a diagnosis of collagenous colitis under the care of of [facility] specialist, presenting with watery diarrhea, intermixed with occasionally constipation,  She has been treated with budesonide for extended period of time, with resolution of her symptoms,   Long history of decreased sweaty, heat intolerance, dry eye, dry mouth   In addition, she reported that her daughter who has passed away in fall of 06-18-2022 has a lot of similar symptoms  Her history is suggestive of panautonomic failure, involving sympathetic noradrenergic, sympathetic cholinergic, likely parasympathetic involvement as well  Etiology including Central nervous system degenerative disorder such as pure autonomic failure, versus multiple system atrophy, with her reported history of acute worsening since October 2024, raise the possibility of autoimmune process, also need to rule out amyloid deposit  Planning on: 1.CND skin biopsy for synucleinopath 2.  Laboratory evaluations were ", including paraneoplastic panel, ganglionic acetylcholine receptor autoantibody, 3.NavigATTR Genetic testing for possible amyloidosis 4. She is on higher dose of midodrine up to 20 mg 4 times a day, Mestinon 60 mg half tablets twice a day, also on fludrocortisone, new prescription of droxidopa

## 2023-06-13 NOTE — Addendum Note (Signed)
Addended by: Alois Cliche on: 06/13/2023 04:58 PM   Modules accepted: Orders

## 2023-06-16 LAB — CUP PACEART INCLINIC DEVICE CHECK
Date Time Interrogation Session: 20250127144451
Implantable Lead Connection Status: 753985
Implantable Lead Connection Status: 753985
Implantable Lead Implant Date: 20140818
Implantable Lead Implant Date: 20140818
Implantable Lead Location: 753859
Implantable Lead Location: 753860
Implantable Lead Model: 5076
Implantable Lead Model: 5076
Implantable Pulse Generator Implant Date: 20241016

## 2023-06-17 ENCOUNTER — Telehealth: Payer: Self-pay

## 2023-06-17 ENCOUNTER — Encounter: Payer: Self-pay | Admitting: Internal Medicine

## 2023-06-17 LAB — MULTIPLE MYELOMA PANEL, SERUM
Albumin SerPl Elph-Mcnc: 3.8 g/dL (ref 2.9–4.4)
Albumin/Glob SerPl: 1.4 (ref 0.7–1.7)
Alpha 1: 0.3 g/dL (ref 0.0–0.4)
Alpha2 Glob SerPl Elph-Mcnc: 0.8 g/dL (ref 0.4–1.0)
B-Globulin SerPl Elph-Mcnc: 0.9 g/dL (ref 0.7–1.3)
Gamma Glob SerPl Elph-Mcnc: 0.8 g/dL (ref 0.4–1.8)
Globulin, Total: 2.8 g/dL (ref 2.2–3.9)
IgA/Immunoglobulin A, Serum: 186 mg/dL (ref 64–422)
IgG (Immunoglobin G), Serum: 887 mg/dL (ref 586–1602)
IgM (Immunoglobulin M), Srm: 38 mg/dL (ref 26–217)
Total Protein: 6.6 g/dL (ref 6.0–8.5)

## 2023-06-17 LAB — SJOGRENS SYNDROME-A EXTRACTABLE NUCLEAR ANTIBODY: ENA SSA (RO) Ab: <0.2 AI (ref 0.0–0.9)

## 2023-06-17 LAB — SJOGRENS SYNDROME-B EXTRACTABLE NUCLEAR ANTIBODY: ENA SSB (LA) Ab: 0.4 AI (ref 0.0–0.9)

## 2023-06-17 LAB — VITAMIN D 25 HYDROXY (VIT D DEFICIENCY, FRACTURES): Vit D, 25-Hydroxy: 49.4 ng/mL (ref 30.0–100.0)

## 2023-06-17 NOTE — Addendum Note (Signed)
Addended by: Levert Feinstein on: 06/17/2023 12:14 PM   Modules accepted: Orders

## 2023-06-17 NOTE — Telephone Encounter (Signed)
Returned call to husband, we reviewed testing and lab work. Husband is aware to come to our office for lab work and will wait in benefit verification for Skin biopsy.

## 2023-06-17 NOTE — Telephone Encounter (Signed)
Patient's husband, Romeo Apple (on [place]) is requesting a call back to discuss upcoming treatment plan.

## 2023-06-18 ENCOUNTER — Telehealth: Payer: Self-pay

## 2023-06-18 ENCOUNTER — Telehealth: Payer: Self-pay | Admitting: Neurology

## 2023-06-18 ENCOUNTER — Other Ambulatory Visit: Payer: Self-pay | Admitting: Neurology

## 2023-06-18 ENCOUNTER — Other Ambulatory Visit (INDEPENDENT_AMBULATORY_CARE_PROVIDER_SITE_OTHER): Payer: Self-pay

## 2023-06-18 ENCOUNTER — Encounter: Payer: Self-pay | Admitting: Neurology

## 2023-06-18 DIAGNOSIS — G9089 Other disorders of autonomic nervous system: Secondary | ICD-10-CM

## 2023-06-18 DIAGNOSIS — E43 Unspecified severe protein-calorie malnutrition: Secondary | ICD-10-CM | POA: Diagnosis not present

## 2023-06-18 DIAGNOSIS — G4700 Insomnia, unspecified: Secondary | ICD-10-CM | POA: Insufficient documentation

## 2023-06-18 DIAGNOSIS — I951 Orthostatic hypotension: Secondary | ICD-10-CM | POA: Diagnosis not present

## 2023-06-18 DIAGNOSIS — R269 Unspecified abnormalities of gait and mobility: Secondary | ICD-10-CM | POA: Diagnosis not present

## 2023-06-18 DIAGNOSIS — Z0289 Encounter for other administrative examinations: Secondary | ICD-10-CM

## 2023-06-18 MED ORDER — CLONAZEPAM 0.5 MG PO TABS: ORAL_TABLET | ORAL | 3 refills | Status: DC

## 2023-06-18 NOTE — Addendum Note (Signed)
Addended by: Lenn Cal on: 06/18/2023 08:32 AM   Modules accepted: Orders

## 2023-06-18 NOTE — Telephone Encounter (Signed)
  Last visit notes, ins, and demographics faxed for benefit verification

## 2023-06-18 NOTE — Telephone Encounter (Signed)
Meds ordered this encounter  Medications   clonazePAM (KLONOPIN) 0.5 MG tablet    Sig: Half to one tab as needed at night for sleep    Dispense:  30 tablet    Refill:  3

## 2023-06-18 NOTE — Telephone Encounter (Signed)
   Mailed [employer] express, called [phone] Confirmation for pick up is [id number]

## 2023-06-19 DIAGNOSIS — E039 Hypothyroidism, unspecified: Secondary | ICD-10-CM | POA: Diagnosis not present

## 2023-06-19 DIAGNOSIS — G909 Disorder of the autonomic nervous system, unspecified: Secondary | ICD-10-CM | POA: Diagnosis not present

## 2023-06-19 DIAGNOSIS — G43919 Migraine, unspecified, intractable, without status migrainosus: Secondary | ICD-10-CM | POA: Diagnosis not present

## 2023-06-19 DIAGNOSIS — F4321 Adjustment disorder with depressed mood: Secondary | ICD-10-CM | POA: Diagnosis not present

## 2023-06-19 DIAGNOSIS — I480 Paroxysmal atrial fibrillation: Secondary | ICD-10-CM | POA: Diagnosis not present

## 2023-06-19 DIAGNOSIS — I700 Atherosclerosis of aorta: Secondary | ICD-10-CM | POA: Diagnosis not present

## 2023-06-20 ENCOUNTER — Encounter: Payer: Self-pay | Admitting: Internal Medicine

## 2023-06-23 LAB — AUTOIMMUNE NEUROLOGY AB

## 2023-06-23 LAB — VITAMIN B12: Vitamin B-12: >2000 pg/mL — ABNORMAL HIGH (ref 232–1245)

## 2023-06-23 LAB — THYROID PANEL WITH TSH
Free Thyroxine Index: 3.3 (ref 1.2–4.9)
T3 Uptake Ratio: 29 % (ref 24–39)
T4, Total: 11.5 ug/dL (ref 4.5–12.0)
TSH: 1.73 u[IU]/mL (ref 0.450–4.500)

## 2023-06-24 ENCOUNTER — Ambulatory Visit: Payer: Medicare Other

## 2023-06-24 ENCOUNTER — Encounter: Payer: Self-pay | Admitting: Neurology

## 2023-06-26 ENCOUNTER — Encounter (INDEPENDENT_AMBULATORY_CARE_PROVIDER_SITE_OTHER): Payer: Self-pay | Admitting: Neurology

## 2023-06-26 DIAGNOSIS — G9089 Other disorders of autonomic nervous system: Secondary | ICD-10-CM

## 2023-06-26 DIAGNOSIS — N329 Bladder disorder, unspecified: Secondary | ICD-10-CM

## 2023-06-26 DIAGNOSIS — R3911 Hesitancy of micturition: Secondary | ICD-10-CM

## 2023-06-26 DIAGNOSIS — R269 Unspecified abnormalities of gait and mobility: Secondary | ICD-10-CM

## 2023-06-26 LAB — GANGLIONIC ACHRAB, SERUM: Ganglionic AChRab, Serum: 0.08 nmol/L — ABNORMAL HIGH (ref 0.000–0.066)

## 2023-07-01 ENCOUNTER — Other Ambulatory Visit (INDEPENDENT_AMBULATORY_CARE_PROVIDER_SITE_OTHER): Payer: Self-pay

## 2023-07-01 DIAGNOSIS — R269 Unspecified abnormalities of gait and mobility: Secondary | ICD-10-CM

## 2023-07-01 DIAGNOSIS — Z0289 Encounter for other administrative examinations: Secondary | ICD-10-CM

## 2023-07-01 DIAGNOSIS — R3911 Hesitancy of micturition: Secondary | ICD-10-CM | POA: Diagnosis not present

## 2023-07-01 DIAGNOSIS — G9089 Other disorders of autonomic nervous system: Secondary | ICD-10-CM

## 2023-07-01 DIAGNOSIS — N329 Bladder disorder, unspecified: Secondary | ICD-10-CM | POA: Insufficient documentation

## 2023-07-01 MED ORDER — PREDNISONE 10 MG PO TABS
ORAL_TABLET | ORAL | 3 refills | Status: DC
Start: 2023-07-01 — End: 2023-07-08

## 2023-07-01 NOTE — Progress Notes (Signed)
Start IVIG 2g/kg, followed by 1g/kg prior authorization

## 2023-07-01 NOTE — Addendum Note (Signed)
Addended by: Levert Feinstein on: 07/01/2023 10:17 AM   Modules accepted: Orders

## 2023-07-02 ENCOUNTER — Telehealth: Payer: Self-pay | Admitting: Neurology

## 2023-07-02 LAB — MICROSCOPIC EXAMINATION: Epithelial Cells (non renal): >10 /[HPF] — AB (ref 0–10)

## 2023-07-02 LAB — URINALYSIS, ROUTINE W REFLEX MICROSCOPIC
Nitrite, UA: NEGATIVE
RBC, UA: NEGATIVE
Specific Gravity, UA: 1.023 (ref 1.005–1.030)
Urobilinogen, Ur: 1 mg/dL (ref 0.2–1.0)
pH, UA: 6 (ref 5.0–7.5)

## 2023-07-02 MED ORDER — NITROFURANTOIN MONOHYD MACRO 100 MG PO CAPS
100.0000 mg | ORAL_CAPSULE | Freq: Two times a day (BID) | ORAL | 0 refills | Status: DC
Start: 2023-07-02 — End: 2023-07-08

## 2023-07-02 NOTE — Addendum Note (Signed)
Addended by: Levert Feinstein on: 07/02/2023 02:29 PM   Modules accepted: Orders

## 2023-07-02 NOTE — Telephone Encounter (Signed)
She is on Midodrin 20mg 

## 2023-07-02 NOTE — Telephone Encounter (Signed)
I called her husband, she did not have good days today, fell couple times, more dizziness since get up in the morning  She is currently taking midodrine 20 mg 4 times a day, still waiting on droxidopa,  Urine analysis did not show frank UTI, but microscopic examination showed moderate bacteria, trace leukocytosis, WBC was 0-5, hazy, patient complains of burning when urinate, will treat her with Macrobid 100 mg twice a day, encourage her to start prednisone tapering as we discussed during last phone call

## 2023-07-02 NOTE — Telephone Encounter (Signed)
Discussed with patient, laboratory evaluation showed positive ganglionic acetylcholine receptor antibody, 0.08, suggest autoimmune component in her pan-autonomic failure  Paraneoplastic panel was negative,  Despite taking midodrine 20 mg 4 times a day, she remains symptomatic, frequent lightheadedness, dizziness  Decided to proceed with IVIG loading dose 2 g/kg followed by 1 g/kg every 3 weeks  Also prednisone 60 mg daily for 2 weeks, then 50 mg for 2 weeks, then 40 mg daily,  Follow-up in 3 to 4 weeks  Please see the MyChart message reply(ies) for my assessment and plan.    This patient gave consent for this Medical Advice Message and is aware that it may result in a bill to [employer], as well as the possibility of receiving a bill for a co-payment or deductible. They are an established patient, but are not seeking medical advice exclusively about a problem treated during an in person or video visit in the last seven days. I did not recommend an in person or video visit within seven days of my reply.    I spent a total of 15 minutes cumulative time within 7 days through [employer].  Levert Feinstein, MD

## 2023-07-02 NOTE — Telephone Encounter (Signed)
Pt's spouse is asking for a call from RN to discuss spells of collapsing from last night to early morning hours

## 2023-07-03 LAB — URINE CULTURE: Organism ID, Bacteria: NO GROWTH

## 2023-07-05 ENCOUNTER — Encounter: Payer: Self-pay | Admitting: Neurology

## 2023-07-08 ENCOUNTER — Other Ambulatory Visit: Payer: Self-pay

## 2023-07-08 DIAGNOSIS — R051 Acute cough: Secondary | ICD-10-CM | POA: Diagnosis not present

## 2023-07-08 DIAGNOSIS — B349 Viral infection, unspecified: Secondary | ICD-10-CM | POA: Diagnosis not present

## 2023-07-08 DIAGNOSIS — R0789 Other chest pain: Secondary | ICD-10-CM | POA: Diagnosis not present

## 2023-07-08 DIAGNOSIS — I480 Paroxysmal atrial fibrillation: Secondary | ICD-10-CM | POA: Diagnosis not present

## 2023-07-08 DIAGNOSIS — R0981 Nasal congestion: Secondary | ICD-10-CM | POA: Diagnosis not present

## 2023-07-08 DIAGNOSIS — G909 Disorder of the autonomic nervous system, unspecified: Secondary | ICD-10-CM | POA: Diagnosis not present

## 2023-07-08 DIAGNOSIS — R110 Nausea: Secondary | ICD-10-CM | POA: Diagnosis not present

## 2023-07-08 DIAGNOSIS — R82998 Other abnormal findings in urine: Secondary | ICD-10-CM | POA: Diagnosis not present

## 2023-07-08 DIAGNOSIS — Z1152 Encounter for screening for COVID-19: Secondary | ICD-10-CM | POA: Diagnosis not present

## 2023-07-08 DIAGNOSIS — R1310 Dysphagia, unspecified: Secondary | ICD-10-CM | POA: Diagnosis not present

## 2023-07-08 DIAGNOSIS — R197 Diarrhea, unspecified: Secondary | ICD-10-CM | POA: Diagnosis not present

## 2023-07-08 DIAGNOSIS — R4189 Other symptoms and signs involving cognitive functions and awareness: Secondary | ICD-10-CM | POA: Diagnosis not present

## 2023-07-08 MED ORDER — DROXIDOPA 100 MG PO CAPS: 300.0000 mg | ORAL_CAPSULE | Freq: Three times a day (TID) | ORAL | 11 refills | Status: DC

## 2023-07-08 MED ORDER — PREDNISONE 10 MG PO TABS: ORAL_TABLET | ORAL | 3 refills | Status: DC

## 2023-07-08 MED ORDER — NITROFURANTOIN MONOHYD MACRO 100 MG PO CAPS: 100.0000 mg | ORAL_CAPSULE | Freq: Two times a day (BID) | ORAL | 0 refills | Status: DC

## 2023-07-08 NOTE — Progress Notes (Signed)
 Resent to [facility]

## 2023-07-08 NOTE — Progress Notes (Signed)
 [facility] is not in network. Must be sent to [facility] [phone]

## 2023-07-08 NOTE — Progress Notes (Signed)
Printed script

## 2023-07-09 ENCOUNTER — Telehealth: Payer: Self-pay | Admitting: Neurology

## 2023-07-09 NOTE — Progress Notes (Signed)
 Remote pacemaker transmission.

## 2023-07-09 NOTE — Telephone Encounter (Signed)
 Spouse is asking for a call from RN to discuss his request for the bilirubin test for pt.  He is saying that the PCP is asking about this as a result of weight loss, and chest congestion. There is a concern for liver involvement. Please call spouse

## 2023-07-09 NOTE — Addendum Note (Signed)
 Addended by: Elease Etienne A on: 07/09/2023 01:04 PM   Modules accepted: Orders

## 2023-07-10 ENCOUNTER — Other Ambulatory Visit (HOSPITAL_COMMUNITY): Payer: Self-pay

## 2023-07-10 ENCOUNTER — Telehealth: Payer: Self-pay

## 2023-07-10 ENCOUNTER — Encounter: Payer: Self-pay | Admitting: Internal Medicine

## 2023-07-10 NOTE — Telephone Encounter (Signed)
 Last CMP including liver functional test was normal in November 2024  She is concerned about abnormal liver function by her PCP, it is better coordinated through her primary care physician,

## 2023-07-10 NOTE — Telephone Encounter (Signed)
 Call from husband and patient, they state she is much worse with memory for the last week and significant difference from yesterday to today. She was seen at PCP yesterday for chest congestion and weight loss.  They saw a NP yesterday and they concerned about liver enzymes but no labs ordered.Husbands states her oxygen was 94% yesterday. Patient is very anxious and afraid because she can't "get her brain to work". Husband wants to know where to take her or what to do. He states she is on day 3 of antibiotic. Advised I will let Dr. Terrace Arabia know concerns

## 2023-07-10 NOTE — Telephone Encounter (Signed)
 Pharmacy Patient Advocate Encounter   Received notification from Pt Calls Messages that prior authorization for Northera 100MG  capsules is required/requested.   Insurance verification completed.   The patient is insured through [employer] .   Per test claim: PA required; PA submitted to above mentioned insurance via CoverMyMeds Key/confirmation #/EOC [id number] Status is pending

## 2023-07-11 NOTE — Telephone Encounter (Signed)
 Pharmacy Patient Advocate Encounter  Received notification from [employer] that Prior Authorization for Northera 100MG  capsules  has been APPROVED from 07/10/2023 to 05/13/2098   PA #/Case ID/Reference #: [id number]

## 2023-07-11 NOTE — Telephone Encounter (Signed)
 Call to patient, no answer. Left voicemail to call back

## 2023-07-11 NOTE — Telephone Encounter (Signed)
Documentation in another phone note.  

## 2023-07-11 NOTE — Telephone Encounter (Signed)
 Returned husband call, asked to call back.

## 2023-07-11 NOTE — Telephone Encounter (Signed)
 Pt's husband returned phone call,  would like a call back.

## 2023-07-11 NOTE — Telephone Encounter (Signed)
 Call to husband, advised no EMG scheduled but will bring patient in for follow up and skin biopsy on 3/4. He is in agreement and will discuss northera taper with Dr. Terrace Arabia. He is taking her to PCP today for sinus infection.

## 2023-07-13 ENCOUNTER — Encounter: Payer: Self-pay | Admitting: Internal Medicine

## 2023-07-16 ENCOUNTER — Encounter: Payer: Self-pay | Admitting: Neurology

## 2023-07-16 ENCOUNTER — Ambulatory Visit (INDEPENDENT_AMBULATORY_CARE_PROVIDER_SITE_OTHER): Payer: Medicare Other | Admitting: Neurology

## 2023-07-16 ENCOUNTER — Telehealth: Payer: Self-pay

## 2023-07-16 VITALS — BP 129/88 | HR 58

## 2023-07-16 DIAGNOSIS — R269 Unspecified abnormalities of gait and mobility: Secondary | ICD-10-CM | POA: Diagnosis not present

## 2023-07-16 DIAGNOSIS — G9089 Other disorders of autonomic nervous system: Secondary | ICD-10-CM | POA: Diagnosis not present

## 2023-07-16 NOTE — Telephone Encounter (Signed)
 Cancelled pick for confirmation [id number], tracking [id number]  Repacked and scheduled pick up for [id number] tracking, confirmation [id number]

## 2023-07-16 NOTE — Telephone Encounter (Signed)
 Call to [employer], scheduled pick up for CND life science skin biopsy. Tracking number [id number] Pick up confirmation # [id number], spoke with Alantia. Asked to be picked up inside of building at front desk

## 2023-07-16 NOTE — Progress Notes (Addendum)
 ASSESSMENT AND PLAN  [name] is a 78 y.o. female   Panautonomic failure with subacute worsening since November 2024  Differentiation diagnosis of her autonomic failure including central nervous system degenerative disorder, such as pure autonomic failure, multiple system atrophy, (all Synucleinopath), versus autoimmune, or paraneoplastic mediated, CND skin biopsy for synucleinopath today  Laboratory evaluations showed negative paraneoplastic panel, ganglionic acetylcholine receptor autoantibody was positive .NavigATTR Genetic testing for possible amyloidosis was reported negative. Higher dose of midodrine up to 20 mg 4 times a day, Mestinon 60 mg half tablets twice a day, also on fludrocortisone, droxidopa  Started prior authorization for IVIG on July 02, 2023, loading dose 2 g/kg followed by 1 g/kg every 3 weeks To not tolerate prednisone 60 mg, only tried once, will decrease to 20 mg daily, to see if she can tolerate better,         DIAGNOSTIC DATA (LABS, IMAGING, TESTING) - I reviewed patient records, labs, notes, testing and imaging myself where available. Skin biopsy result by [facility]: March 4th 2025, there is pathological evidence of phosphorylated alpha-synuclein deposition within the cutaneous nerves, this finding is consistent with a diagnosis of alpha-synuclein,  There is normal intraepidermal nerve fiber density, this does not exclude a small fiber neuropathy or neurodegenerative process  There is no pathological evidence of amyloid deposit in cutaneous nerve,  MEDICAL HISTORY:  [name] is a 78 year old female, seen in request by neurosurgeon Dr. Barnett Abu for evaluation of low back pain, leg weakness, her primary care physician is Dr. [name], [name], initial evaluation was on November 10, 2021  I reviewed and summarized the referring note.PMHX. S/p pacemaker. Histroy of right cervical radiculopathy, surgery did help.  Hx of  bilateral hip replacement  In October 2022, after lifting a heavy box of books, she noticed low back pain, muscle spasm and lower back pain  She was initially treated with NSAIDs, muscle relaxant with some help  But since then, with prolonged standing, bending over she will develop low back pain, then was given steroid, physical therapy,  She had a history of bilateral hip replacement,  Over the past few months, she developed worsening low back pain, radiating pain to left lateral thigh region, she received injection for left hip bursitis, did not help her symptoms  CT of left hip in February 2023 showed total arthroplasty without hardware failure or complication  CT lumbar myelogram October 25, 2021 showed lumbar region disc bulging, facet osteoarthritis with mild narrowing of lateral recess at L3-4, L4-5 without definite neural compression,  CT thoracic spine showed chronic compression deformity of T3 no significant canal stenosis  She now complains of significant left lower extremity weakness, when she lying down sitting down no significant pain, when she getting up she complains of significant left leg pain 9 out of 10, radiating to the left lateral leg, deep aching pain at nighttime, has to take frequent over-the-counter medications  UPDATE August 16th 2023: Patient is accompanied by her husband return for electrodiagnostic study today, which showed evidence of mild peripheral neuropathy if anything, the test is also limited due to her lower extremity pitting edema, relative preserved distal lower extremity motor nerve conduction study  EMG nerve conduction study showed no evidence of active lumbar radiculopathy  On further questioning, today she mainly complains of subacute onset of bilateral lower extremity deep achy pain, mainly involving left lateral thigh,  now similar involvement of the right side, also complains of low back pain, upper  extremity pain  She was treated with tapering  dose of the steroid for 1 week by orthopedic surgeon for left hip pain, she did reported significant improvement afterwards, benefit last for more than 4 weeks, but she could not tolerate the side effect of high-dose of prednisone after 60 mg daily,  She later had few sessions of epidural injection without any benefit  Today she also complains of worsening urinary urgency, to the point of incontinence,  She did have a history of cervical decompression surgery in the past, for severe right cervical radiculopathy, denied gait abnormality prior to her cervical decompression surgery  UPDATE Sept 5th 2023: She is accompanied by her husband at today's clinical visit, continues to complains of moderate left lateral thigh pain, right thigh pain, gait abnormality mainly due to limitation from pain  Reviewed laboratory evaluations normal high-sensitivity C-reactive protein ESR, CPK, B12, ANA, slightly low vitamin D 26.2  Reviewed home medication list, she has been on Lexapro 10 mg since age 55, she does suffer depression following total hysterectomy, she denies depression,  Personally reviewed CT cervical spine 07-Sep-2023status post ACDF C5-6 C6-7, no significant canal foraminal narrowing  CT lumbar myelogram October 25, 2021, multilevel degenerative changes, most obvious degenerative changes worse at L4-5, facet and ligamentous hypertrophy, mild stenosis of lateral recess but without definite neural compression  Husband revealed that she has become very sedentary since 2022, likely deconditioned  Virtual Visit via video August 23 2022:  She was setup for physical therapy following last visit, but shortly after that she was called in to take care of her daughter at [place], who is facing a lots of surgery, hiatal hernia, SI joint fusion, now needing shoulder replacement.  She is very struggling with her condition mentally and physically.  Continue complains of gait abnormality, intermittent body achy  pain involving her lower extremity, knees, low back, frequent muscle spasm, sometimes leg gave out underneath her, like a rubber, risk for fall,  Update June 04 2023: She presented to emergency room on April 09, 2023 for significant headache, speech difficulty, dizziness, right-sided weakness, the headache was severe, sharp, at the right periorbital region,  Was diagnosed with complex migraine versus TIA,  Had MRI of the brain, which showed no acute abnormality, moderate atrophy, small vessel disease,  CT angiogram of head and neck showed no large vessel disease  Laboratory evaluation showed normal CBC, INR, CMP showed low potassium of 3.2, LDL 53, A1c of 5.7  Patient reported ever since then, she has significant worsening of her dizziness, profound orthostatic blood pressure change, fainting when standing up, shortness of breath walking across the room, lack of appetite, 20 pound weight loss over the past few months, loss of the taste,  She no longer have headaches, but profound orthostatic hypotension is very disability eating for her  She has been under the care of cardiologist Dr. Graciela Husbands for many years, who reported that she has insidious onset of orthostatic hypotension since 2015, also had cardiac block required pacing since 2014, previously complicated by microperforation, moderate effusion, then Adderall withdrawal, complete heart block, required pacemaker, generator replacement October 2024, seen by GI specialist for collagenous colitis, presenting with frequent diarrhea,  She also had excessive stress over the past few years, took care of her daughter at [place], who passed away in 2023/01/19 She was started on midodrine as far back as 2015, was on 2.5 mg, significant increase of dosage up to 50 mg 3 times a day with  limited help, also on Mestinon since October 2024, 60 mg trigger significant GI side effect, could only tolerate 30 mg twice a day, fludrocortisone 0.1 mg daily  since October  UPDATE Jan 27th 2025:  I reviewed also discussed with her cardiologist Dr. Graciela Husbands  She has long history of gradual onset autonomic symptoms, mild progressive worsening orthostatic hypotension since 09/21/2013, cardiac block, required pacemaker,  Patient also reported a diagnosis of collagenous colitis under the care of of [facility] specialist, presenting with watery diarrhea, intermixed with occasionally constipation,  She has been treated with budesonide for extended period of time, with resolution of her symptoms, Long history of decreased sweaty, heat intolerance, dry eye, dry mouth In addition, she reported that her daughter who has passed away in fall of Sep 22, 2022 has a lot of similar symptoms Her history is suggestive of panautonomic failure, involving sympathetic noradrenergic, sympathetic cholinergic, likely parasympathetic involvement as well Etiology including Central nervous system degenerative disorder such as pure autonomic failure, versus multiple system atrophy, with her reported history of acute worsening since October 2024, raise the possibility of autoimmune process, also need to rule out amyloid deposit  Planning on: 1.CND skin biopsy for synucleinopath 2.  Laboratory evaluations were ", including paraneoplastic panel, ganglionic acetylcholine receptor autoantibody, 3.NavigATTR Genetic testing for possible amyloidosis 4. She is on higher dose of midodrine up to 20 mg 4 times a day, Mestinon 60 mg half tablets twice a day, also on fludrocortisone, new prescription of droxidopa  UPDATE July 16 2023: She is accompanied by her husband at today's visit, continue to have worsening symptoms, can barely standing up, even transfer from house to car would cause fainting spells, today's examination showed significant orthostatic hypotension, from lying down 136/76 heart rate of 57 to standing up 55/38 heart rate of 77  She is already on Mestinon 60 mg half tablets twice a day,  midodrine up to 20 mg 4 times a day, fludrocortisone 0.1 mg daily, droxidopa was prescribed, husband is frustrated about insurance prior authorization process, have not get the medication yet  Extensive laboratory evaluation showed positive ganglionic acetylcholine receptor antibody, 0.08, along with her subacute worsening with her chronic orthostatic hypotension suggest autoimmune component in her pan-autonomic failure   Paraneoplastic panel was negative,   Started prior authorization for IVIG on July 02, 2023, loading dose 2 g/kg followed by 1 g/kg every 3 weeks   I gave her prescription of prednisone 60 mg daily for 2 weeks, then 50 mg for 2 weeks, then 40 mg daily, she only tried prednisone 60 mg once, complains of significant GI side effect, difficulty sleeping, did not continued on it  She now have significant nausea, poor appetite, not sweat, diarrhea, consistent with pan autonomic failure  PHYSICAL EXAM:     07/16/2023    9:04 AM 06/10/2023    3:01 PM 06/10/2023    3:00 PM  Vitals with BMI  Systolic 129 81 114  Diastolic 88 56 71  Pulse 58 76 66    Lying 136/76, HR57,  Sitting 100/68, hr 81 Standing up: 55/38, hr77  MENTAL STATUS: Speech/cognition: Awake, alert, oriented to history taking and casual conversation CRANIAL NERVES: CN II: Visual fields are full to confrontation. Pupils are round equal and briskly reactive to light. CN III, IV, VI: extraocular movement are normal. No ptosis. CN V: Facial sensation is intact to light touch CN VII: Face is symmetric with normal eye closure  CN VIII: Hearing is normal to causal conversation. CN IX, X:  Phonation is normal. CN XI: Head turning and shoulder shrug are intact  MOTOR: Moving 4 extremities without difficulty  SENSORY: intact to light touch  COORDINATION: There is no trunk or limb dysmetria noted.  GAIT/STANCE: Very lightheaded standing up, with significant blood pressure drop, barely able to take any  steps  REVIEW OF SYSTEMS:  Full 14 system review of systems performed and notable only for as above All other review of systems were negative.   ALLERGIES: Allergies  Allergen Reactions   Anesthetics, Amide Other (See Comments)    Patient unsure of names, however multiples cause swelling of airway & nausea   Clindamycin Swelling   Shellfish Allergy Other (See Comments)    Neurotoxic reaction   Sulfonamide Derivatives Anaphylaxis and Swelling   Neomycin Swelling   Zolpidem Tartrate Other (See Comments)    Jerking motions    Azithromycin Other (See Comments)    Severe gastritis    Bactroban Other (See Comments)    Causes sores in nose   Ciprofloxacin     joint swelling   Codeine Swelling   Meperidine Hcl Nausea Only    Hallucinations    Morphine Nausea Only    Hallucinations    Neosporin [Neomycin-Polymyxin-Gramicidin] Hives and Dermatitis    All topical "orin's ointment"   Nitrofurantoin     neuropathy in legs   Penicillins Other (See Comments)    Swelling in joints   Percocet [Oxycodone-Acetaminophen]     Dizziness    Tramadol     Makes jerk   Latex Rash    HOME MEDICATIONS: Current Outpatient Medications  Medication Sig Dispense Refill   acetaminophen (TYLENOL) 500 MG tablet Take 1,000 mg by mouth 2 (two) times daily as needed for moderate pain.     ALPRAZolam (XANAX) 0.25 MG tablet Take 0.25 mg by mouth daily as needed for anxiety.     aspirin EC 81 MG EC tablet Take 1 tablet (81 mg total) by mouth daily.     budesonide (ENTOCORT EC) 3 MG 24 hr capsule Take 3 mg by mouth daily.     Calcium Carbonate-Vitamin D (CALTRATE 600+D PO) Take 1 tablet by mouth daily.     cefdinir (OMNICEF) 300 MG capsule Take 300 mg by mouth 2 (two) times daily.     cetirizine (ZYRTEC) 10 MG tablet Take 10 mg by mouth daily.     Cholecalciferol (VITAMIN D) 50 MCG (2000 UT) CAPS Take 2,000 Units by mouth daily.     clonazePAM (KLONOPIN) 0.5 MG tablet Half to one tab as needed at night  for sleep 30 tablet 3   cycloSPORINE (RESTASIS) 0.05 % ophthalmic emulsion Place 1 drop into both eyes 2 (two) times daily as needed (dry eyes).     diclofenac Sodium (VOLTAREN) 1 % GEL Apply 1 application  topically daily as needed (pain).     droxidopa (NORTHERA) 100 MG CAPS Take 3 capsules (300 mg total) by mouth 3 (three) times daily with meals. 270 capsule 11   DULoxetine (CYMBALTA) 60 MG capsule Take 60 mg by mouth daily.     estradiol (ESTRING) 2 MG vaginal ring Place 2 mg vaginally every 3 (three) months. follow package directions     fludrocortisone (FLORINEF) 0.1 MG tablet Take 1 tablet (0.1 mg total) by mouth daily. 270 tablet 3   folic acid (FOLVITE) 400 MCG tablet Take 400 mcg by mouth daily.     levothyroxine (SYNTHROID) 50 MCG tablet Take 50 mcg by mouth 3 (three) times a week.  Take 50 mcg daily on Tuesday,Thursday, and Sunday     levothyroxine (SYNTHROID) 75 MCG tablet Take 75 mcg by mouth 4 (four) times a week. Pt takes on Monday,Wednesday,Friday, and Saturday     Magnesium 250 MG TABS Take 250 mg by mouth daily.     meclizine (ANTIVERT) 25 MG tablet Take 25 mg by mouth 3 (three) times daily as needed for dizziness.      Melatonin 5 MG CAPS Take 5 mg by mouth at bedtime.     midodrine (PROAMATINE) 10 MG tablet Take 2 tablets (20 mg total) by mouth 4 (four) times daily. 240 tablet 3   potassium chloride (KLOR-CON M) 10 MEQ tablet Take 2 tablets (20 mEq total) by mouth daily. 180 tablet 3   Probiotic Product (ALIGN) 4 MG CAPS Take 4 mg by mouth at bedtime.      pyridostigmine (MESTINON) 60 MG tablet Take 0.5 tablets (30 mg total) by mouth 2 (two) times daily. 90 tablet 3   rosuvastatin (CRESTOR) 10 MG tablet Take 1 tablet (10 mg total) by mouth daily. (Patient taking differently: Take 10 mg by mouth at bedtime.) 90 tablet 0   vitamin B-12 (CYANOCOBALAMIN) 500 MCG tablet Take 500 mcg by mouth daily.     nitrofurantoin, macrocrystal-monohydrate, (MACROBID) 100 MG capsule Take 1  capsule (100 mg total) by mouth 2 (two) times daily. (Patient not taking: Reported on 07/16/2023) 10 capsule 0   Omega 3 1200 MG CAPS Take 1,200 mg by mouth daily. (Patient not taking: Reported on 07/16/2023)     predniSONE (DELTASONE) 10 MG tablet 6 tab every morning x2 weeks,   5 tabs every morning x 2 weeks. 4 tabs every morning (Patient not taking: Reported on 07/16/2023) 180 tablet 3   No current facility-administered medications for this visit.   Facility-Administered Medications Ordered in Other Visits  Medication Dose Route Frequency Provider Last Rate Last Admin   bupivacaine (MARCAINE) 0.5 % 10 mL, triamcinolone acetonide (KENALOG-40) 40 mg injection   Subcutaneous Once Jethro Bolus, MD       bupivacaine (MARCAINE) 0.5 % 15 mL, phenazopyridine (PYRIDIUM) 400 mg bladder mixture   Bladder Instillation Once Jethro Bolus, MD        PAST MEDICAL HISTORY: Past Medical History:  Diagnosis Date   ADHD (attention deficit hyperactivity disorder)    Allergy    trees/pollen, mold, fungus, dust mites. Takes allergy shots   Arthritis    PAIN AND OA LEFT HIP   Asthma    allergist Dr Illene Labrador- monthly allergy injections   Autonomic dysfunction    CENTRAL NERVOUS SYSTEM NEUROPATHY - DX BY DR. Sandria Manly MORE THAN 10 YRS AGO - AND IT IS FELT TO CONTRIBUTE TO THE AUTOMIC  DYSFUNCTION-- PT HAS NUMBNESS LEGS AND FEET AND SOMETIIMES TIPS OF FINGER, SEVERE CONSTIPATION( NO SENSATION TO HAVE BM ), DOUBLE VISION, ORTHOSTATIC HYPOTENSION   Blood transfusion    Breast cancer (HCC)    right side   Bruises easily    Cataract    Chest pain    a. 12/2012 Cath: nl cors, EF 55-65%.   Complication of anesthesia    reaction to some anesthetics/ 7/12 anesth record on chart- states prefers epidural   CVA (cerebrovascular accident) (HCC) 03/21/2018   Depression    Diverticulosis    Dysrhythmia    HX OF HIGH GRADE HEART BLOCK - REQUIRED PACEMAKER INSERTION   Esophageal stricture    Gastritis    GERD  (gastroesophageal reflux disease)  H/O hiatal hernia    Hemorrhoids    Hernia of abdominal wall    spigelian hernia RLQ - SURGERY TO REPAIR   Hypothyroidism    Interstitial cystitis    Latex allergy, contact dermatitis    Mallory - Weiss tear    HEALED    Neuromuscular disorder (HCC)    central nervous system neuropathy- seen per Dr Sandria Manly   Pacemaker    Pericardial effusion    a. 12/2012 following ppm placement;  b. 01/01/2013 Echo: EF 55-60%, small pericardial effusion w/o RV collapse-->No need for tap/window.   PONV (postoperative nausea and vomiting)    pt needs scop patch   Recurrent upper respiratory infection (URI) 1/13- to present   bronchitis following surgery- states improved but still with cough. OV with [facility] Dr [name] 09/06/11 on chart   Shortness of breath    AT TIMES - BUT MUCH IMPROVED AFTER PACEMAKER WAS REPROGRAMED.   Symptomatic bradycardia    a. 12/2012 s/p MDT dual chamber PPM, ser # [id number]; b. 12/2012 post-op course complicated by pericardial effusinon req lead revision.   Thyroid disease     PAST SURGICAL HISTORY: Past Surgical History:  Procedure Laterality Date   ABDOMINAL HYSTERECTOMY  1974   BACK SURGERY     cervical fusion 4-5 with plate   BLADDER SUSPENSION     BREAST BIOPSY  2002   NO BLOOD PRESSURES ON RIGHT SIDE/   s/p  axillary node dissection   CYSTO WITH HYDRODISTENSION  09/07/2011   Procedure: CYSTOSCOPY/HYDRODISTENSION;  Surgeon: Kathi Ludwig, MD;  Location: [facility];  Service: Urology;  Laterality: N/A;  INSTILLATION OF MARCAINE/PYRIDIUM INSTILLATION OF MARCAINE/KENALOG   CYSTOSCOPY  1975, 2007   EYE SURGERY     LASIK EYE SURGERY BILATERAL   LAPAROSCOPY  1973   LEAD REVISION N/A 12/30/2012   Procedure: LEAD REVISION;  Surgeon: Marinus Maw, MD;  Location: [facility] CATH LAB;  Service: Cardiovascular;  Laterality: N/A;   LEFT HEART CATHETERIZATION WITH CORONARY ANGIOGRAM N/A 12/29/2012   Procedure: LEFT HEART CATHETERIZATION WITH  CORONARY ANGIOGRAM;  Surgeon: Peter M Swaziland, MD;  [facility] CATH LAB;  Service: Cardiovascular;  Laterality: N/A;   MASTECTOMY MODIFIED RADICAL     right; with immediate reconstruction   MYRINGOPLASTY  1962   NECK SURGERY     c4-5 ruptured disc   OOPHORECTOMY  1982   PACEMAKER INSERTION     PERMANENT PACEMAKER INSERTION N/A 12/29/2012   Procedure: PERMANENT PACEMAKER INSERTION;  Surgeon: [facility] Salvia, MD;  Location: [facility] CATH LAB;  Service: Cardiovascular;  Laterality: N/A;   PPM GENERATOR CHANGEOUT N/A 02/27/2023   Procedure: PPM GENERATOR CHANGEOUT;  Surgeon: [facility] Salvia, MD;  Location: [facility] INVASIVE CV LAB;  Service: Cardiovascular;  Laterality: N/A;   RECTOCELE REPAIR     with cystocele repair   TONSILLECTOMY     TOTAL HIP ARTHROPLASTY Right    TOTAL HIP ARTHROPLASTY Left 06/12/2013   Procedure: LEFT TOTAL HIP ARTHROPLASTY ANTERIOR APPROACH;  Surgeon: Kathryne Hitch, MD;  Location: [facility];  Service: Orthopedics;  Laterality: Left;   TYMPANOPLASTY  1973   right   ULNAR NERVE TRANSPOSITION Right    VENTRAL HERNIA REPAIR  05/30/2011   Procedure: HERNIA REPAIR VENTRAL ADULT;  Surgeon: Currie Paris, MD;  Location: [facility];  Service: General;  Laterality: Right;  repair right spigelian hernia    FAMILY HISTORY: Family History  Problem Relation Age of Onset   Heart disease Father  Colon cancer Mother 66   Depression Mother    Pancreatic cancer Sister 41   Diverticulitis Sister    Uterine cancer Maternal Grandmother    Heart disease Brother    Heart disease Paternal Grandmother    Heart disease Paternal Grandfather    Throat cancer Maternal Uncle    Heart disease Paternal Aunt    Heart disease Paternal Uncle    Heart disease Brother    Thrombosis Maternal Aunt    Cancer Maternal Uncle        NOS   Diabetes Other        aunt    SOCIAL HISTORY: Social History   Socioeconomic History   Marital status: Married    Spouse name: Not on  file   Number of children: 2   Years of education: 15   Highest education level: Not on file  Occupational History   Occupation: Retired    Associate Professor: [employer]    Comment: vp corporate affairs [employer]    Employer: [employer]  Tobacco Use   Smoking status: Never   Smokeless tobacco: Never  Vaping Use   Vaping status: Never Used  Substance and Sexual Activity   Alcohol use: Yes    Alcohol/week: 7.0 standard drinks of alcohol    Types: 7 Glasses of wine per week    Comment: occasional   Drug use: No   Sexual activity: Not on file  Other Topics Concern   Not on file  Social History Narrative   Lives at home w/ her husband   Patient drinks 1-2 cup of caffeine daily.   Patient is right handed.   Social Drivers of Corporate investment banker Strain: Not on file  Food Insecurity: No Food Insecurity (04/10/2023)   Hunger Vital Sign    Worried About Running Out of Food in the Last Year: Never true    Ran Out of Food in the Last Year: Never true  Transportation Needs: No Transportation Needs (04/10/2023)   PRAPARE - Administrator, Civil Service (Medical): No    Lack of Transportation (Non-Medical): No  Physical Activity: Not on file  Stress: Not on file  Social Connections: Unknown (09/24/2021)   [facility]   Social Network    Social Network: Not on file  Intimate Partner Violence: Not At Risk (04/10/2023)   Humiliation, Afraid, Rape, and Kick questionnaire    Fear of Current or Ex-Partner: No    Emotionally Abused: No    Physically Abused: No    Sexually Abused: No      Levert Feinstein, M.D. Ph.D.  [facility] [address] [place], [place] [address] Ph: [phone] Fax: [phone]  CC:  Geoffry Paradise, MD [address] [place],  [place] [address]  Geoffry Paradise, MD

## 2023-07-17 ENCOUNTER — Encounter: Payer: Medicare Other | Admitting: Neurology

## 2023-07-17 ENCOUNTER — Encounter: Payer: Self-pay | Admitting: Neurology

## 2023-07-17 NOTE — Progress Notes (Addendum)
 Patient was in right lateral recombinant position. Sterile technique. 1% lidocaine with epinephrine was used for local anesthesia. Punctuated skin biopsy was performed. 3 mm skin sample were obtained at at left leg (10 cm above left lateral malleolus), left distal thigh (10 cm above left lateral fibular head), left cervical region (3 cm lateral to C7)  Patient tolerated the procedure well.  The wound was covered with neosporin antibiotic cream and bandage.

## 2023-07-22 ENCOUNTER — Telehealth: Payer: Self-pay

## 2023-07-22 NOTE — Telephone Encounter (Signed)
 Marland Kitchen

## 2023-07-23 ENCOUNTER — Other Ambulatory Visit (HOSPITAL_COMMUNITY): Payer: Self-pay

## 2023-07-23 ENCOUNTER — Other Ambulatory Visit: Payer: Self-pay | Admitting: Obstetrics

## 2023-07-23 ENCOUNTER — Telehealth: Payer: Self-pay

## 2023-07-23 ENCOUNTER — Telehealth: Payer: Self-pay | Admitting: Neurology

## 2023-07-23 DIAGNOSIS — Z13820 Encounter for screening for osteoporosis: Secondary | ICD-10-CM

## 2023-07-23 NOTE — Telephone Encounter (Signed)
 Due to restrictions (listed in approval letter in patient media), [facility] is probably calling to verify the dosage so that they can do the override on their end to fill the medication for patient. Nurse to contact [facility] at number they provided.

## 2023-07-23 NOTE — Telephone Encounter (Signed)
 [facility] Fredrik Rigger) Requesting overide for prior authorization due to insurance said only 180 tablet per 30 day.and prescription asking for 270 tablets. Asking to see if can approve the overide  to get more. When do the overide nurse can call insurance company; [phone]

## 2023-07-23 NOTE — Telephone Encounter (Signed)
 Call to [phone], spoke [employer], gave verbal override for 270 tablets/30 day supply per script by Dr. Terrace Arabia, approved CASEID [id number]

## 2023-07-24 ENCOUNTER — Encounter: Payer: Self-pay | Admitting: Neurology

## 2023-07-24 ENCOUNTER — Encounter: Payer: Self-pay | Admitting: Internal Medicine

## 2023-07-24 ENCOUNTER — Telehealth: Payer: Self-pay

## 2023-07-24 NOTE — Telephone Encounter (Signed)
 COPY OF MYCHART MESSAGE SENT TO PATIENT  Hi [name],  I just spoke with Dr. Terrace Arabia to confirm that you take the Droxidopa along with your Midodrine. Your pills should be 100 mg tablets and this should be your titration schedule.   Continue Midodrine as prescribed  Droxidopa- Week 1 and week 2 - 1 tablet (100 mg) three times a day Week 3 and week 4 - 2 tablets (200 mg) three times a day Week 5 and week 6 - 3 tablets (300 mg ) three times a day Week 7 ongoing - continue 3 tablets (300 mg) three times a day  When the medication is delivered, if they did not send 100 mg tablets, please let us know.   Thank you, Courtney Sinning RN

## 2023-07-25 ENCOUNTER — Telehealth: Payer: Self-pay | Admitting: Neurology

## 2023-07-25 NOTE — Telephone Encounter (Signed)
 I called and spoke to pt husband and stated that Dr. Terrace Arabia is out of office and that the skin biopsy results can take a while and to give [place] a call if he doesn't hear back from [place] on Wednesday of next week since Dr. Terrace Arabia will be returning on 07/29/23. Pt husband voiced gratitude and understanding of all discussed.

## 2023-07-25 NOTE — Telephone Encounter (Signed)
 Pt's husband called wanting to know when the Biopsy results will be given to them. Please advise.

## 2023-07-29 ENCOUNTER — Encounter (INDEPENDENT_AMBULATORY_CARE_PROVIDER_SITE_OTHER): Payer: Self-pay

## 2023-07-30 ENCOUNTER — Encounter: Payer: Self-pay | Admitting: Neurology

## 2023-07-30 DIAGNOSIS — F320 Major depressive disorder, single episode, mild: Secondary | ICD-10-CM | POA: Diagnosis not present

## 2023-07-31 ENCOUNTER — Telehealth: Payer: Self-pay

## 2023-07-31 ENCOUNTER — Telehealth: Payer: Self-pay | Admitting: Neurology

## 2023-07-31 NOTE — Telephone Encounter (Signed)
 Call to [facility], spoke with Maryagnes Amos, Pharmacist. He stated medication was on hold due to history of cardiac ischemia. Reviewed medical history and problem list. Discussed with Dr. Terrace Arabia and advised to continue medication as prescribed. MyChart message sent to patient with update.

## 2023-07-31 NOTE — Telephone Encounter (Signed)
 [facility] Almira Coaster) Requesting call back to discuss CND synone test.

## 2023-07-31 NOTE — Telephone Encounter (Signed)
 Retruned call to regina, left message to return call and sent an email.

## 2023-07-31 NOTE — Telephone Encounter (Signed)
 Spoke with regina and email sent with explanation of tests ordered

## 2023-08-01 ENCOUNTER — Other Ambulatory Visit: Payer: Self-pay

## 2023-08-01 MED ORDER — POTASSIUM CHLORIDE CRYS ER 10 MEQ PO TBCR: 20.0000 meq | EXTENDED_RELEASE_TABLET | Freq: Every day | ORAL | 3 refills | Status: DC

## 2023-08-05 ENCOUNTER — Telehealth: Payer: Self-pay | Admitting: Neurology

## 2023-08-05 ENCOUNTER — Encounter (INDEPENDENT_AMBULATORY_CARE_PROVIDER_SITE_OTHER): Payer: Self-pay | Admitting: Neurology

## 2023-08-05 DIAGNOSIS — G9089 Other disorders of autonomic nervous system: Secondary | ICD-10-CM | POA: Diagnosis not present

## 2023-08-05 DIAGNOSIS — R269 Unspecified abnormalities of gait and mobility: Secondary | ICD-10-CM

## 2023-08-05 NOTE — Telephone Encounter (Signed)
 Pt's spouse states there is concern of hypertension as a result of the new medication droxidopa (NORTHERA) 100 MG CAPS .  He is asking for a call to discuss.

## 2023-08-06 ENCOUNTER — Telehealth: Payer: Self-pay

## 2023-08-06 NOTE — Telephone Encounter (Signed)
 Call to husband who reports heart rate in the 80's but blood pressure is running 187/118, 189/109, 192/102 ( electronic reading ).  They husband reports swelling in ankles and lower legs, short periods of chest pains in the evening, and 12 pound weight gain. We discussed tachycardia and husband reports heart rates in the 80's and 90's with the highest being 99. husband reports the prednisone being taken in the morning. She denies any stroke symptoms at this time but is concerned about the blood pressure and chest pains. Is currently taking 100 mg droxidopa three times daily, mestinon 30 mg twice daily , midodrine 10 mg (2 tablets 4x daily), prednisone 20 mg daily along with other medications listed in medicine list. Advised I would send to Dr. Terrace Arabia high priority and and speak to her when she arrives. Advised if blood pressure reading are that high to call 911 in the future. Husband verbalized understanding.

## 2023-08-06 NOTE — Telephone Encounter (Signed)
 I called patient to talk with her husband and patient,  She is not taking Mestinon 60mg  1/2 at am, lunch, Entocort 3mg  every am Prednisone 10mg  2 tabs am Crestor 10mg  at bedtime. Fludrocortisone 0.1mg  qam Cymbalta 60mg  qam Midodrine 20mg  9am, 1pm, 5pm Doxidopa 100mg  9am, 1pm, 5pm,   She gets up 9am, go to bed 11pm  She is very active lately, last several days, she can walk around more, hair appointment, she has good appetite,   They are concerned about elevated SBP 180s.  At nighttime,  I advise her to stop Midodrine at 5pm, keep rest the same.  Please give her follow-up appointment in 4 to 6 weeks  Please see the MyChart message reply(ies) for my assessment and plan.    This patient gave consent for this Medical Advice Message and is aware that it may result in a bill to [employer], as well as the possibility of receiving a bill for a co-payment or deductible. They are an established patient, but are not seeking medical advice exclusively about a problem treated during an in person or video visit in the last seven days. I did not recommend an in person or video visit within seven days of my reply.    I spent a total of 15 minutes cumulative time within 7 days through [employer].  Levert Feinstein, MD

## 2023-08-08 DIAGNOSIS — I951 Orthostatic hypotension: Secondary | ICD-10-CM | POA: Diagnosis not present

## 2023-08-08 DIAGNOSIS — G9089 Other disorders of autonomic nervous system: Secondary | ICD-10-CM | POA: Diagnosis not present

## 2023-08-08 DIAGNOSIS — G909 Disorder of the autonomic nervous system, unspecified: Secondary | ICD-10-CM | POA: Diagnosis not present

## 2023-08-09 DIAGNOSIS — F320 Major depressive disorder, single episode, mild: Secondary | ICD-10-CM | POA: Diagnosis not present

## 2023-08-12 DIAGNOSIS — H5111 Convergence insufficiency: Secondary | ICD-10-CM | POA: Diagnosis not present

## 2023-08-12 DIAGNOSIS — H40003 Preglaucoma, unspecified, bilateral: Secondary | ICD-10-CM | POA: Diagnosis not present

## 2023-08-12 DIAGNOSIS — H5212 Myopia, left eye: Secondary | ICD-10-CM | POA: Diagnosis not present

## 2023-08-12 DIAGNOSIS — H04123 Dry eye syndrome of bilateral lacrimal glands: Secondary | ICD-10-CM | POA: Diagnosis not present

## 2023-08-12 DIAGNOSIS — Z961 Presence of intraocular lens: Secondary | ICD-10-CM | POA: Diagnosis not present

## 2023-08-15 ENCOUNTER — Encounter: Payer: Self-pay | Admitting: Internal Medicine

## 2023-08-23 ENCOUNTER — Encounter: Payer: Self-pay | Admitting: Neurology

## 2023-08-23 ENCOUNTER — Telehealth: Payer: Self-pay | Admitting: Neurology

## 2023-08-23 DIAGNOSIS — F320 Major depressive disorder, single episode, mild: Secondary | ICD-10-CM | POA: Diagnosis not present

## 2023-08-23 NOTE — Telephone Encounter (Signed)
 Please scan her skin biopsy result, also leave a copy for our office to review before coming visit in May

## 2023-08-26 NOTE — Telephone Encounter (Signed)
 Marland Kitchen

## 2023-08-28 ENCOUNTER — Ambulatory Visit (INDEPENDENT_AMBULATORY_CARE_PROVIDER_SITE_OTHER): Payer: Medicare Other

## 2023-08-28 DIAGNOSIS — I441 Atrioventricular block, second degree: Secondary | ICD-10-CM | POA: Diagnosis not present

## 2023-08-29 LAB — CUP PACEART REMOTE DEVICE CHECK
Battery Remaining Longevity: 134 mo
Battery Voltage: 3.15 V
Brady Statistic AP VP Percent: 23.13 %
Brady Statistic AP VS Percent: 0 %
Brady Statistic AS VP Percent: 76.86 %
Brady Statistic AS VS Percent: 0.01 %
Brady Statistic RA Percent Paced: 23.11 %
Brady Statistic RV Percent Paced: 99.99 %
Date Time Interrogation Session: 20250415225509
Implantable Lead Connection Status: 753985
Implantable Lead Connection Status: 753985
Implantable Lead Implant Date: 20140818
Implantable Lead Implant Date: 20140818
Implantable Lead Location: 753859
Implantable Lead Location: 753860
Implantable Lead Model: 5076
Implantable Lead Model: 5076
Implantable Pulse Generator Implant Date: 20241016
Lead Channel Impedance Value: 323 Ohm
Lead Channel Impedance Value: 494 Ohm
Lead Channel Impedance Value: 494 Ohm
Lead Channel Impedance Value: 513 Ohm
Lead Channel Pacing Threshold Amplitude: 0.75 V
Lead Channel Pacing Threshold Amplitude: 0.875 V
Lead Channel Pacing Threshold Pulse Width: 0.4 ms
Lead Channel Pacing Threshold Pulse Width: 0.4 ms
Lead Channel Sensing Intrinsic Amplitude: 1.75 mV
Lead Channel Sensing Intrinsic Amplitude: 1.75 mV
Lead Channel Setting Pacing Amplitude: 1.75 V
Lead Channel Setting Pacing Amplitude: 2.5 V
Lead Channel Setting Pacing Pulse Width: 0.4 ms
Lead Channel Setting Sensing Sensitivity: 1.2 mV
Zone Setting Status: 755011
Zone Setting Status: 755011

## 2023-09-02 ENCOUNTER — Other Ambulatory Visit: Payer: Self-pay | Admitting: Internal Medicine

## 2023-09-03 DIAGNOSIS — G6289 Other specified polyneuropathies: Secondary | ICD-10-CM | POA: Diagnosis not present

## 2023-09-03 DIAGNOSIS — G9089 Other disorders of autonomic nervous system: Secondary | ICD-10-CM | POA: Diagnosis not present

## 2023-09-04 ENCOUNTER — Telehealth: Payer: Self-pay | Admitting: Neurology

## 2023-09-04 ENCOUNTER — Telehealth: Payer: Self-pay

## 2023-09-04 DIAGNOSIS — G9089 Other disorders of autonomic nervous system: Secondary | ICD-10-CM | POA: Diagnosis not present

## 2023-09-04 DIAGNOSIS — G6289 Other specified polyneuropathies: Secondary | ICD-10-CM | POA: Diagnosis not present

## 2023-09-04 NOTE — Telephone Encounter (Signed)
 Midodrine  10 2 tabs at 8am Droxidopa  100mg  2 tab 8am,   Mestinon  30mg  once a day,  Prednisone  20mg  x2  She has significant supine hypertension, advised her to not take midodrine  or droxidopa  after 5 PM

## 2023-09-04 NOTE — Telephone Encounter (Signed)
 Faxed preliminary order to [facility] for [facility]

## 2023-09-05 ENCOUNTER — Encounter: Payer: Self-pay | Admitting: Internal Medicine

## 2023-09-05 DIAGNOSIS — G6289 Other specified polyneuropathies: Secondary | ICD-10-CM | POA: Diagnosis not present

## 2023-09-05 DIAGNOSIS — G9089 Other disorders of autonomic nervous system: Secondary | ICD-10-CM | POA: Diagnosis not present

## 2023-09-06 DIAGNOSIS — L570 Actinic keratosis: Secondary | ICD-10-CM | POA: Diagnosis not present

## 2023-09-06 DIAGNOSIS — D692 Other nonthrombocytopenic purpura: Secondary | ICD-10-CM | POA: Diagnosis not present

## 2023-09-06 DIAGNOSIS — L821 Other seborrheic keratosis: Secondary | ICD-10-CM | POA: Diagnosis not present

## 2023-09-06 DIAGNOSIS — D1801 Hemangioma of skin and subcutaneous tissue: Secondary | ICD-10-CM | POA: Diagnosis not present

## 2023-09-09 DIAGNOSIS — G6289 Other specified polyneuropathies: Secondary | ICD-10-CM | POA: Diagnosis not present

## 2023-09-09 DIAGNOSIS — G9089 Other disorders of autonomic nervous system: Secondary | ICD-10-CM | POA: Diagnosis not present

## 2023-09-10 ENCOUNTER — Encounter (INDEPENDENT_AMBULATORY_CARE_PROVIDER_SITE_OTHER): Payer: Self-pay | Admitting: Neurology

## 2023-09-10 DIAGNOSIS — G9089 Other disorders of autonomic nervous system: Secondary | ICD-10-CM | POA: Diagnosis not present

## 2023-09-10 DIAGNOSIS — R269 Unspecified abnormalities of gait and mobility: Secondary | ICD-10-CM

## 2023-09-10 NOTE — Telephone Encounter (Signed)
Please see the MyChart message reply(ies) for my assessment and plan.    This patient gave consent for this Medical Advice Message and is aware that it may result in a bill to [employer], as well as the possibility of receiving a bill for a co-payment or deductible. They are an established patient, but are not seeking medical advice exclusively about a problem treated during an in person or video visit in the last seven days. I did not recommend an in person or video visit within seven days of my reply.    I spent a total of 12 minutes cumulative time within 7 days through [employer].  Levert Feinstein, MD

## 2023-09-18 DIAGNOSIS — K52831 Collagenous colitis: Secondary | ICD-10-CM | POA: Diagnosis not present

## 2023-09-18 DIAGNOSIS — K219 Gastro-esophageal reflux disease without esophagitis: Secondary | ICD-10-CM | POA: Diagnosis not present

## 2023-09-18 DIAGNOSIS — Z800 Family history of malignant neoplasm of digestive organs: Secondary | ICD-10-CM | POA: Diagnosis not present

## 2023-09-19 ENCOUNTER — Encounter: Payer: Self-pay | Admitting: Neurology

## 2023-09-19 ENCOUNTER — Ambulatory Visit (INDEPENDENT_AMBULATORY_CARE_PROVIDER_SITE_OTHER): Admitting: Neurology

## 2023-09-19 VITALS — BP 135/72 | HR 76 | Ht 67.0 in | Wt 151.5 lb

## 2023-09-19 DIAGNOSIS — G9089 Other disorders of autonomic nervous system: Secondary | ICD-10-CM | POA: Diagnosis not present

## 2023-09-19 DIAGNOSIS — G6289 Other specified polyneuropathies: Secondary | ICD-10-CM

## 2023-09-19 DIAGNOSIS — R269 Unspecified abnormalities of gait and mobility: Secondary | ICD-10-CM | POA: Diagnosis not present

## 2023-09-19 DIAGNOSIS — M629 Disorder of muscle, unspecified: Secondary | ICD-10-CM | POA: Diagnosis not present

## 2023-09-19 DIAGNOSIS — G64 Other disorders of peripheral nervous system: Secondary | ICD-10-CM

## 2023-09-19 DIAGNOSIS — M7989 Other specified soft tissue disorders: Secondary | ICD-10-CM

## 2023-09-19 NOTE — Progress Notes (Signed)
 ASSESSMENT AND PLAN  [name] is a 78 y.o. female   Panautonomic failure with subacute worsening since November 2024  Differentiation diagnosis of her autonomic failure including central nervous system degenerative disorder, such as pure autonomic failure, multiple system atrophy, (all Synucleinopath), versus autoimmune,  CND skin biopsy for synucleinopath in March 202: there is pathological evidence of phosphorylated alpha-synuclein deposition within the cutaneous nerves, this finding is consistent with a diagnosis of alpha-synuclein,   Laboratory evaluations showed negative paraneoplastic panel, ganglionic acetylcholine receptor autoantibody was positive NavigATTR Genetic testing for possible amyloidosis was reported negative.  Could not tolerate high dose of prednisone  60 mg decreased to 20 mg daily since February 2025, tolerating it well, was not sure about the benefit She started ivig in April 2025, tolerating it over all  She noticed most dramatic improvement of her symptoms since starting droxidopa  in March, she was on variable dosage currently taking midodrine  10 mg 3 times a day alternating with droxidopa  100 mg 3 times a day, Mestinon  30 mg daily, sometimes has supine hypertension,  Laboratory evaluations, including UA to rule out urinary tract infection New onset left lower extremities swelling, pain,  Left lower extremity Doppler study to rule out DVT      DIAGNOSTIC DATA (LABS, IMAGING, TESTING) - I reviewed patient records, labs, notes, testing and imaging myself where available. Skin biopsy result by [facility] Test: March 4th 2025, there is pathological evidence of phosphorylated alpha-synuclein deposition within the cutaneous nerves, this finding is consistent with a diagnosis of alpha-synuclein,  There is normal intraepidermal nerve fiber density, this does not exclude a small fiber neuropathy or neurodegenerative process  There is no pathological evidence  of amyloid deposit in cutaneous nerve,  MEDICAL HISTORY:  [name] is a 78 year old female, seen in request by neurosurgeon Dr. Elna Haggis for evaluation of low back pain, leg weakness, her primary care physician is Dr. Delorise Few, [facility], initial evaluation was on November 10, 2021  I reviewed and summarized the referring note.PMHX. S/p pacemaker. Histroy of right cervical radiculopathy, surgery did help.  Hx of bilateral hip replacement  In October 2022, after lifting a heavy box of books, she noticed low back pain, muscle spasm and lower back pain  She was initially treated with NSAIDs, muscle relaxant with some help  But since then, with prolonged standing, bending over she will develop low back pain, then was given steroid, physical therapy,  She had a history of bilateral hip replacement,  Over the past few months, she developed worsening low back pain, radiating pain to left lateral thigh region, she received injection for left hip bursitis, did not help her symptoms  CT of left hip in February 2023 showed total arthroplasty without hardware failure or complication  CT lumbar myelogram October 25, 2021 showed lumbar region disc bulging, facet osteoarthritis with mild narrowing of lateral recess at L3-4, L4-5 without definite neural compression,  CT thoracic spine showed chronic compression deformity of T3 no significant canal stenosis  She now complains of significant left lower extremity weakness, when she lying down sitting down no significant pain, when she getting up she complains of significant left leg pain 9 out of 10, radiating to the left lateral leg, deep aching pain at nighttime, has to take frequent over-the-counter medications  UPDATE August 16th 2023: Patient is accompanied by her husband return for electrodiagnostic study today, which showed evidence of mild peripheral neuropathy if anything, the test is also limited due to her lower  extremity pitting edema,  relative preserved distal lower extremity motor nerve conduction study  EMG nerve conduction study showed no evidence of active lumbar radiculopathy  On further questioning, today she mainly complains of subacute onset of bilateral lower extremity deep achy pain, mainly involving left lateral thigh,  now similar involvement of the right side, also complains of low back pain, upper extremity pain  She was treated with tapering dose of the steroid for 1 week by orthopedic surgeon for left hip pain, she did reported significant improvement afterwards, benefit last for more than 4 weeks, but she could not tolerate the side effect of high-dose of prednisone  after 60 mg daily,  She later had few sessions of epidural injection without any benefit  Today she also complains of worsening urinary urgency, to the point of incontinence,  She did have a history of cervical decompression surgery in the past, for severe right cervical radiculopathy, denied gait abnormality prior to her cervical decompression surgery  UPDATE Sept 5th 2023: She is accompanied by her husband at today's clinical visit, continues to complains of moderate left lateral thigh pain, right thigh pain, gait abnormality mainly due to limitation from pain  Reviewed laboratory evaluations normal high-sensitivity C-reactive protein ESR, CPK, B12, ANA, slightly low vitamin D  26.2  Reviewed home medication list, she has been on Lexapro  10 mg since age 24, she does suffer depression following total hysterectomy, she denies depression,  Personally reviewed CT cervical spine August 2023, status post ACDF C5-6 C6-7, no significant canal foraminal narrowing  CT lumbar myelogram October 25, 2021, multilevel degenerative changes, most obvious degenerative changes worse at L4-5, facet and ligamentous hypertrophy, mild stenosis of lateral recess but without definite neural compression  Husband revealed that she has become very sedentary since 2022,  likely deconditioned  Virtual Visit via video August 23 2022:  She was setup for physical therapy following last visit, but shortly after that she was called in to take care of her daughter at [place] , who is facing a lots of surgery, hiatal hernia, SI joint fusion, now needing shoulder replacement.  She is very struggling with her condition mentally and physically.  Continue complains of gait abnormality, intermittent body achy pain involving her lower extremity, knees, low back, frequent muscle spasm, sometimes leg gave out underneath her, like a rubber, risk for fall,  Update June 04 2023: She presented to emergency room on April 09, 2023 for significant headache, speech difficulty, dizziness, right-sided weakness, the headache was severe, sharp, at the right periorbital region,  Was diagnosed with complex migraine versus TIA,  Had MRI of the brain, which showed no acute abnormality, moderate atrophy, small vessel disease,  CT angiogram of head and neck showed no large vessel disease  Laboratory evaluation showed normal CBC, INR, CMP showed low potassium of 3.2, LDL 53, A1c of 5.7  Patient reported ever since then, she has significant worsening of her dizziness, profound orthostatic blood pressure change, fainting when standing up, shortness of breath walking across the room, lack of appetite, 20 pound weight loss over the past few months, loss of the taste,  She no longer have headaches, but profound orthostatic hypotension is very disability eating for her  She has been under the care of cardiologist Dr. Rodolfo Clan for many years, who reported that she has insidious onset of orthostatic hypotension since 2015, also had cardiac block required pacing since 2014, previously complicated by microperforation, moderate effusion, then Adderall withdrawal, complete heart block, required pacemaker, generator replacement October 2024, seen  by GI specialist for collagenous colitis, presenting with  frequent diarrhea,  She also had excessive stress over the past few years, took care of her daughter at [place] , who passed away in 01-07-23 She was started on midodrine  as far back as Oct 06, 2013, was on 2.5 mg, significant increase of dosage up to 50 mg 3 times a day with limited help, also on Mestinon  since October 2024, 60 mg trigger significant GI side effect, could only tolerate 30 mg twice a day, fludrocortisone  0.1 mg daily since October  UPDATE Jan 27th 2025:  I reviewed also discussed with her cardiologist Dr. Rodolfo Clan  She has long history of gradual onset autonomic symptoms, mild progressive worsening orthostatic hypotension since 10-06-2013, cardiac block, required pacemaker,  Patient also reported a diagnosis of collagenous colitis under the care of of [facility] specialist, presenting with watery diarrhea, intermixed with occasionally constipation,  She has been treated with budesonide  for extended period of time, with resolution of her symptoms, Long history of decreased sweaty, heat intolerance, dry eye, dry mouth In addition, she reported that her daughter who has passed away in fall of Oct 07, 2022 has a lot of similar symptoms Her history is suggestive of panautonomic failure, involving sympathetic noradrenergic, sympathetic cholinergic, likely parasympathetic involvement as well Etiology including Central nervous system degenerative disorder such as pure autonomic failure, versus multiple system atrophy, with her reported history of acute worsening since October 2024, raise the possibility of autoimmune process, also need to rule out amyloid deposit  Planning on: 1.CND skin biopsy for synucleinopath 2.  Laboratory evaluations were ", including paraneoplastic panel, ganglionic acetylcholine receptor autoantibody, 3.NavigATTR Genetic testing for possible amyloidosis 4. She is on higher dose of midodrine  up to 20 mg 4 times a day, Mestinon  60 mg half tablets twice a day, also on fludrocortisone ,  new prescription of droxidopa   UPDATE July 16 2023: She is accompanied by her husband at today's visit, continue to have worsening symptoms, can barely standing up, even transfer from house to car would cause fainting spells, today's examination showed significant orthostatic hypotension, from lying down 136/76 heart rate of 57 to standing up 55/38 heart rate of 77  She is already on Mestinon  60 mg half tablets twice a day, midodrine  up to 20 mg 4 times a day, fludrocortisone  0.1 mg daily, droxidopa  was prescribed, husband is frustrated about insurance prior authorization process, have not get the medication yet  Extensive laboratory evaluation showed positive ganglionic acetylcholine receptor antibody, 0.08, along with her subacute worsening with her chronic orthostatic hypotension suggest autoimmune component in her pan-autonomic failure   Paraneoplastic panel was negative,   Started prior authorization for IVIG on July 02, 2023, loading dose 2 g/kg followed by 1 g/kg every 3 weeks   I gave her prescription of prednisone  60 mg daily for 2 weeks, then 50 mg for 2 weeks, then 40 mg daily, she only tried prednisone  60 mg once, complains of significant GI side effect, difficulty sleeping, did not continued on it  She now have significant nausea, poor appetite, not sweat, diarrhea, consistent with pan autonomic failure  UPDATE May 8th 2025: Skin biopsy was positive by [facility] in march 2025, there is pathological evidence of phosphorylated alpha-synuclein deposition within the cutaneous nerves, this finding is consistent with a diagnosis of alpha-synuclein,   She was given a trial of prednisone  60 mg, could not tolerated, decrease to 20 mg in February 2025, was not sure about the benefit, but she is treated with [facility]  for collagenous colitis, has been on stable dose of budesonide  XR 3 mg daily for many years, still have intermittent watery diarrhea, loose stool, since started prednisone , her  stool is much normal, well-formed,  IVIG since April 2025, overall tolerating it okay,  The most dramatic improvement after she started taking droxidopa  up to 100 mg 3 times a day since March,  She is now on Midodrine  10mg   3 times a day, she take it at 8am, 12, 4pm Droxidoma 100mg  at 9am, 2pm, 5pm,     Mestinon  60 mg half tablets every morning  Frequent nocturnal awakening, urged to urinate,  PHYSICAL EXAM:     09/19/2023    2:29 PM 07/16/2023    9:04 AM 06/10/2023    3:01 PM  Vitals with BMI  Height 5\' 7"     Weight 151 lbs 8 oz    BMI 23.72    Systolic 135 129 81  Diastolic 72 88 56  Pulse 76 58 76    Sitting 151/83, HR79 Standing up: 118/69,hr83 Standing up x one minute,  131/71,  HR84,  MENTAL STATUS: Speech/cognition: Awake, alert, oriented to history taking and casual conversation CRANIAL NERVES: CN II: Visual fields are full to confrontation. Pupils are round equal and briskly reactive to light. CN III, IV, VI: extraocular movement are normal. No ptosis. CN V: Facial sensation is intact to light touch CN VII: Face is symmetric with normal eye closure  CN VIII: Hearing is normal to causal conversation. CN IX, X: Phonation is normal. CN XI: Head turning and shoulder shrug are intact  MOTOR: Moving 4 extremities without difficulty  SENSORY: intact to light touch  COORDINATION: There is no trunk or limb dysmetria noted.  GAIT/STANCE: Very lightheaded standing up, with significant blood pressure drop, barely able to take any steps  REVIEW OF SYSTEMS:  Full 14 system review of systems performed and notable only for as above All other review of systems were negative.   ALLERGIES: Allergies  Allergen Reactions   Anesthetics, Amide Other (See Comments)    Patient unsure of names, however multiples cause swelling of airway & nausea   Clindamycin  Swelling   Shellfish Allergy Other (See Comments)    Neurotoxic reaction   Sulfonamide Derivatives Anaphylaxis  and Swelling   Neomycin Swelling   Zolpidem  Tartrate Other (See Comments)    Jerking motions    Azithromycin  Other (See Comments)    Severe gastritis    Bactroban Other (See Comments)    Causes sores in nose   Ciprofloxacin     joint swelling   Codeine Swelling   Meperidine Hcl Nausea Only    Hallucinations    Morphine  Nausea Only    Hallucinations    Neosporin [Neomycin-Polymyxin-Gramicidin] Hives and Dermatitis    All topical "orin's ointment"   Nitrofurantoin      neuropathy in legs   Oxycodone -Acetaminophen      Dizziness  Other Reaction(s): Dizziness   Penicillins Other (See Comments)    Swelling in joints   Tramadol      Makes jerk   Latex Rash    HOME MEDICATIONS: Current Outpatient Medications  Medication Sig Dispense Refill   acetaminophen  (TYLENOL ) 500 MG tablet Take 1,000 mg by mouth 2 (two) times daily as needed for moderate pain.     ALPRAZolam  (XANAX ) 0.25 MG tablet Take 0.25 mg by mouth daily as needed for anxiety.     aspirin  EC 81 MG EC tablet Take 1 tablet (81 mg total) by mouth daily.  budesonide  (ENTOCORT EC ) 3 MG 24 hr capsule Take 3 mg by mouth daily.     Calcium  Carbonate-Vitamin D  (CALTRATE 600+D PO) Take 1 tablet by mouth daily.     cefdinir (OMNICEF) 300 MG capsule Take 300 mg by mouth 2 (two) times daily.     cetirizine (ZYRTEC) 10 MG tablet Take 10 mg by mouth daily.     Cholecalciferol  (VITAMIN D ) 50 MCG (2000 UT) CAPS Take 2,000 Units by mouth daily.     clonazePAM  (KLONOPIN ) 0.5 MG tablet Half to one tab as needed at night for sleep 30 tablet 3   cycloSPORINE (RESTASIS) 0.05 % ophthalmic emulsion Place 1 drop into both eyes 2 (two) times daily as needed (dry eyes).     diclofenac Sodium (VOLTAREN) 1 % GEL Apply 1 application  topically daily as needed (pain).     droxidopa  (NORTHERA ) 100 MG CAPS Take 3 capsules (300 mg total) by mouth 3 (three) times daily with meals. 270 capsule 11   DULoxetine  (CYMBALTA ) 60 MG capsule Take 60 mg by mouth  daily.     estradiol (ESTRING) 2 MG vaginal ring Place 2 mg vaginally every 3 (three) months. follow package directions     fludrocortisone  (FLORINEF ) 0.1 MG tablet Take 1 tablet (0.1 mg total) by mouth daily. 270 tablet 3   folic acid (FOLVITE) 400 MCG tablet Take 400 mcg by mouth daily.     levothyroxine  (SYNTHROID ) 50 MCG tablet Take 50 mcg by mouth 3 (three) times a week. Take 50 mcg daily on Tuesday,Thursday, and Sunday     levothyroxine  (SYNTHROID ) 75 MCG tablet Take 75 mcg by mouth 4 (four) times a week. Pt takes on Monday,Wednesday,Friday, and Saturday     Magnesium  250 MG TABS Take 250 mg by mouth daily.     meclizine  (ANTIVERT ) 25 MG tablet Take 25 mg by mouth 3 (three) times daily as needed for dizziness.      Melatonin 5 MG CAPS Take 5 mg by mouth at bedtime.     midodrine  (PROAMATINE ) 10 MG tablet TAKE 2 TABLETS (20 MG TOTAL) BY MOUTH 4 (FOUR) TIMES DAILY. 720 tablet 3   nitrofurantoin , macrocrystal-monohydrate, (MACROBID ) 100 MG capsule Take 1 capsule (100 mg total) by mouth 2 (two) times daily. 10 capsule 0   Omega 3 1200 MG CAPS Take 1,200 mg by mouth daily.     potassium chloride  (KLOR-CON  M) 10 MEQ tablet Take 2 tablets (20 mEq total) by mouth daily. 180 tablet 3   predniSONE  (DELTASONE ) 10 MG tablet 6 tab every morning x2 weeks,   5 tabs every morning x 2 weeks. 4 tabs every morning 180 tablet 3   Probiotic Product (ALIGN) 4 MG CAPS Take 4 mg by mouth at bedtime.      pyridostigmine  (MESTINON ) 60 MG tablet Take 0.5 tablets (30 mg total) by mouth 2 (two) times daily. 90 tablet 3   rosuvastatin  (CRESTOR ) 10 MG tablet Take 1 tablet (10 mg total) by mouth daily. (Patient taking differently: Take 10 mg by mouth at bedtime.) 90 tablet 0   vitamin B-12 (CYANOCOBALAMIN ) 500 MCG tablet Take 500 mcg by mouth daily.     No current facility-administered medications for this visit.   Facility-Administered Medications Ordered in Other Visits  Medication Dose Route Frequency Provider Last  Rate Last Admin   bupivacaine  (MARCAINE ) 0.5 % 10 mL, triamcinolone  acetonide (KENALOG -40) 40 mg injection   Subcutaneous Once Tannenbaum, Sigmund, MD       bupivacaine  (MARCAINE ) 0.5 %  15 mL, phenazopyridine  (PYRIDIUM ) 400 mg bladder mixture   Bladder Instillation Once Annamarie Kid, MD        PAST MEDICAL HISTORY: Past Medical History:  Diagnosis Date   ADHD (attention deficit hyperactivity disorder)    Allergy    trees/pollen, mold, fungus, dust mites. Takes allergy shots   Arthritis    PAIN AND OA LEFT HIP   Asthma    allergist Dr Kozlo- monthly allergy injections   Autonomic dysfunction    CENTRAL NERVOUS SYSTEM NEUROPATHY - DX BY DR. Dania Dupre MORE THAN 10 YRS AGO - AND IT IS FELT TO CONTRIBUTE TO THE AUTOMIC  DYSFUNCTION-- PT HAS NUMBNESS LEGS AND FEET AND SOMETIIMES TIPS OF FINGER, SEVERE CONSTIPATION( NO SENSATION TO HAVE BM ), DOUBLE VISION, ORTHOSTATIC HYPOTENSION   Blood transfusion    Breast cancer (HCC)    right side   Bruises easily    Cataract    Chest pain    a. 12/2012 Cath: nl cors, EF 55-65%.   Complication of anesthesia    reaction to some anesthetics/ 7/12 anesth record on chart- states prefers epidural   CVA (cerebrovascular accident) (HCC) 03/21/2018   Depression    Diverticulosis    Dysrhythmia    HX OF HIGH GRADE HEART BLOCK - REQUIRED PACEMAKER INSERTION   Esophageal stricture    Gastritis    GERD (gastroesophageal reflux disease)    H/O hiatal hernia    Hemorrhoids    Hernia of abdominal wall    spigelian hernia RLQ - SURGERY TO REPAIR   Hypothyroidism    Interstitial cystitis    Latex allergy, contact dermatitis    Mallory - Weiss tear    HEALED    Neuromuscular disorder (HCC)    central nervous system neuropathy- seen per Dr Dania Dupre   Pacemaker    Pericardial effusion    a. 12/2012 following ppm placement;  b. 01/01/2013 Echo: EF 55-60%, small pericardial effusion w/o RV collapse-->No need for tap/window.   PONV (postoperative nausea and  vomiting)    pt needs scop patch   Recurrent upper respiratory infection (URI) 1/13- to present   bronchitis following surgery- states improved but still with cough. OV with [facility] Dr Delorise Few 09/06/11 on chart   Shortness of breath    AT TIMES - BUT MUCH IMPROVED AFTER PACEMAKER WAS REPROGRAMED.   Symptomatic bradycardia    a. 12/2012 s/p MDT dual chamber PPM, ser # [id number]; b. 12/2012 post-op course complicated by pericardial effusinon req lead revision.   Thyroid  disease     PAST SURGICAL HISTORY: Past Surgical History:  Procedure Laterality Date   ABDOMINAL HYSTERECTOMY  1974   BACK SURGERY     cervical fusion 4-5 with plate   BLADDER SUSPENSION     BREAST BIOPSY  2002   NO BLOOD PRESSURES ON RIGHT SIDE/   s/p  axillary node dissection   CYSTO WITH HYDRODISTENSION  09/07/2011   Procedure: CYSTOSCOPY/HYDRODISTENSION;  Surgeon: Edmund Gouge, MD;  Location: [facility];  Service: Urology;  Laterality: N/A;  INSTILLATION OF MARCAINE /PYRIDIUM  INSTILLATION OF MARCAINE /KENALOG    CYSTOSCOPY  1975, 2007   EYE SURGERY     LASIK EYE SURGERY BILATERAL   LAPAROSCOPY  1973   LEAD REVISION N/A 12/30/2012   Procedure: LEAD REVISION;  Surgeon: Tammie Fall, MD;  Location: [facility] CATH LAB;  Service: Cardiovascular;  Laterality: N/A;   LEFT HEART CATHETERIZATION WITH CORONARY ANGIOGRAM N/A 12/29/2012   Procedure: LEFT HEART CATHETERIZATION WITH CORONARY ANGIOGRAM;  Surgeon: Hubert Madden  [place], MD;  Location: [facility] CATH LAB;  Service: Cardiovascular;  Laterality: N/A;   MASTECTOMY MODIFIED RADICAL     right; with immediate reconstruction   MYRINGOPLASTY  1962   NECK SURGERY     c4-5 ruptured disc   OOPHORECTOMY  1982   PACEMAKER INSERTION     PERMANENT PACEMAKER INSERTION N/A 12/29/2012   Procedure: PERMANENT PACEMAKER INSERTION;  Surgeon: Verona Goodwill, MD;  Location: [facility] CATH LAB;  Service: Cardiovascular;  Laterality: N/A;   PPM GENERATOR CHANGEOUT N/A 02/27/2023   Procedure: PPM GENERATOR  CHANGEOUT;  Surgeon: Verona Goodwill, MD;  Location: [facility] INVASIVE CV LAB;  Service: Cardiovascular;  Laterality: N/A;   RECTOCELE REPAIR     with cystocele repair   TONSILLECTOMY     TOTAL HIP ARTHROPLASTY Right    TOTAL HIP ARTHROPLASTY Left 06/12/2013   Procedure: LEFT TOTAL HIP ARTHROPLASTY ANTERIOR APPROACH;  Surgeon: Arnie Lao, MD;  Location: [facility];  Service: Orthopedics;  Laterality: Left;   TYMPANOPLASTY  1973   right   ULNAR NERVE TRANSPOSITION Right    VENTRAL HERNIA REPAIR  05/30/2011   Procedure: HERNIA REPAIR VENTRAL ADULT;  Surgeon: Darcella Earnest, MD;  Location: [facility];  Service: General;  Laterality: Right;  repair right spigelian hernia    FAMILY HISTORY: Family History  Problem Relation Age of Onset   Heart disease Father    Colon cancer Mother 6   Depression Mother    Pancreatic cancer Sister 68   Diverticulitis Sister    Uterine cancer Maternal Grandmother    Heart disease Brother    Heart disease Paternal Grandmother    Heart disease Paternal Grandfather    Throat cancer Maternal Uncle    Heart disease Paternal Aunt    Heart disease Paternal Uncle    Heart disease Brother    Thrombosis Maternal Aunt    Cancer Maternal Uncle        NOS   Diabetes Other        aunt    SOCIAL HISTORY: Social History   Socioeconomic History   Marital status: Married    Spouse name: Not on file   Number of children: 2   Years of education: 15   Highest education level: Not on file  Occupational History   Occupation: Retired    Associate Professor: [employer]    Comment: vp corporate affairs [employer]    Employer: [employer]  Tobacco Use   Smoking status: Never   Smokeless tobacco: Never  Vaping Use   Vaping status: Never Used  Substance and Sexual Activity   Alcohol  use: Yes    Alcohol /week: 7.0 standard drinks of alcohol     Types: 7 Glasses of wine per week    Comment: occasional   Drug use: No   Sexual  activity: Not on file  Other Topics Concern   Not on file  Social History Narrative   Lives at home w/ her husband   Patient drinks 1-2 cup of caffeine daily.   Patient is right handed.   Social Drivers of Corporate investment banker Strain: Not on file  Food Insecurity: No Food Insecurity (04/10/2023)   Hunger Vital Sign    Worried About Running Out of Food in the Last Year: Never true    Ran Out of Food in the Last Year: Never true  Transportation Needs: No Transportation Needs (04/10/2023)   PRAPARE - Transportation    Lack of  Transportation (Medical): No    Lack of Transportation (Non-Medical): No  Physical Activity: Not on file  Stress: Not on file  Social Connections: Unknown (09/24/2021)   Received from [facility], [facility]   Social Network    Social Network: Not on file  Intimate Partner Violence: Not At Risk (04/10/2023)   Humiliation, Afraid, Rape, and Kick questionnaire    Fear of Current or Ex-Partner: No    Emotionally Abused: No    Physically Abused: No    Sexually Abused: No      Phebe Brasil, M.D. Ph.D.  [facility] [address] [place], [place] [address] Ph: [phone] Fax: [phone]  CC:  Suan Elm, MD [address] [place],  [place] [address]  Suan Elm, MD    Total time spent reviewing the chart, obtaining history, examined patient, ordering tests, documentation, consultations and family, care coordination was  55 mintues Level 5 consult 60 minutes.

## 2023-09-20 ENCOUNTER — Ambulatory Visit (HOSPITAL_COMMUNITY)
Admission: RE | Admit: 2023-09-20 | Discharge: 2023-09-20 | Disposition: A | Discharge status: Home / Self Care | Source: Ambulatory Visit | Attending: Neurology | Admitting: Neurology

## 2023-09-20 ENCOUNTER — Encounter: Payer: Self-pay | Admitting: Neurology

## 2023-09-20 ENCOUNTER — Other Ambulatory Visit (HOSPITAL_COMMUNITY): Payer: Self-pay

## 2023-09-20 ENCOUNTER — Ambulatory Visit (INDEPENDENT_AMBULATORY_CARE_PROVIDER_SITE_OTHER): Admitting: Student-PharmD

## 2023-09-20 ENCOUNTER — Encounter: Payer: Self-pay | Admitting: Student-PharmD

## 2023-09-20 ENCOUNTER — Telehealth: Payer: Self-pay

## 2023-09-20 VITALS — BP 151/85 | HR 72

## 2023-09-20 DIAGNOSIS — G9089 Other disorders of autonomic nervous system: Secondary | ICD-10-CM | POA: Insufficient documentation

## 2023-09-20 DIAGNOSIS — R269 Unspecified abnormalities of gait and mobility: Secondary | ICD-10-CM | POA: Diagnosis not present

## 2023-09-20 DIAGNOSIS — I82442 Acute embolism and thrombosis of left tibial vein: Secondary | ICD-10-CM | POA: Insufficient documentation

## 2023-09-20 DIAGNOSIS — M7989 Other specified soft tissue disorders: Secondary | ICD-10-CM | POA: Diagnosis not present

## 2023-09-20 LAB — COMPREHENSIVE METABOLIC PANEL WITH GFR
ALT: 151 IU/L — ABNORMAL HIGH (ref 0–32)
AST: 90 IU/L — ABNORMAL HIGH (ref 0–40)
Albumin: 4.3 g/dL (ref 3.8–4.8)
Alkaline Phosphatase: 87 IU/L (ref 44–121)
BUN/Creatinine Ratio: 21 (ref 12–28)
BUN: 19 mg/dL (ref 8–27)
Bilirubin Total: 0.4 mg/dL (ref 0.0–1.2)
CO2: 21 mmol/L (ref 20–29)
Calcium: 9.4 mg/dL (ref 8.7–10.3)
Chloride: 98 mmol/L (ref 96–106)
Creatinine, Ser: 0.89 mg/dL (ref 0.57–1.00)
Globulin, Total: 4.2 g/dL (ref 1.5–4.5)
Glucose: 146 mg/dL — ABNORMAL HIGH (ref 70–99)
Potassium: 4.3 mmol/L (ref 3.5–5.2)
Sodium: 138 mmol/L (ref 134–144)
Total Protein: 8.5 g/dL (ref 6.0–8.5)
eGFR: 66 mL/min/{1.73_m2} (ref >59)

## 2023-09-20 LAB — CBC WITH DIFFERENTIAL/PLATELET
Basophils Absolute: 0 10*3/uL (ref 0.0–0.2)
Basos: 0 %
EOS (ABSOLUTE): 0 10*3/uL (ref 0.0–0.4)
Eos: 0 %
Hematocrit: 39.2 % (ref 34.0–46.6)
Hemoglobin: 13.3 g/dL (ref 11.1–15.9)
Immature Grans (Abs): 0 10*3/uL (ref 0.0–0.1)
Immature Granulocytes: 1 %
Lymphocytes Absolute: 1.7 10*3/uL (ref 0.7–3.1)
Lymphs: 19 %
MCH: 31.7 pg (ref 26.6–33.0)
MCHC: 33.9 g/dL (ref 31.5–35.7)
MCV: 93 fL (ref 79–97)
Monocytes Absolute: 0.3 10*3/uL (ref 0.1–0.9)
Monocytes: 3 %
Neutrophils Absolute: 6.7 10*3/uL (ref 1.4–7.0)
Neutrophils: 77 %
Platelets: 288 10*3/uL (ref 150–450)
RBC: 4.2 x10E6/uL (ref 3.77–5.28)
RDW: 14 % (ref 11.7–15.4)
WBC: 8.8 10*3/uL (ref 3.4–10.8)

## 2023-09-20 LAB — MICROSCOPIC EXAMINATION
Casts: NONE SEEN /LPF
RBC, Urine: NONE SEEN /HPF (ref 0–2)

## 2023-09-20 LAB — URINALYSIS, ROUTINE W REFLEX MICROSCOPIC
Bilirubin, UA: NEGATIVE
Ketones, UA: NEGATIVE
Leukocytes,UA: NEGATIVE
Nitrite, UA: NEGATIVE
RBC, UA: NEGATIVE
Specific Gravity, UA: 1.017 (ref 1.005–1.030)
Urobilinogen, Ur: 0.2 mg/dL (ref 0.2–1.0)
pH, UA: 6.5 (ref 5.0–7.5)

## 2023-09-20 LAB — TSH: TSH: 1.31 u[IU]/mL (ref 0.450–4.500)

## 2023-09-20 LAB — HGB A1C W/O EAG: Hgb A1c MFr Bld: 6.1 % — ABNORMAL HIGH (ref 4.8–5.6)

## 2023-09-20 MED ORDER — APIXABAN (ELIQUIS) VTE STARTER PACK (10MG AND 5MG)
ORAL_TABLET | ORAL | 0 refills | Status: DC
  Filled 2023-09-20: qty 74, 30d supply, fill #0

## 2023-09-20 MED ORDER — APIXABAN 5 MG PO TABS
5.0000 mg | ORAL_TABLET | Freq: Two times a day (BID) | ORAL | 4 refills | Status: DC
Start: 2023-09-20 — End: 2024-03-31

## 2023-09-20 NOTE — Patient Instructions (Addendum)
-  Start apixaban (Eliquis) 10 mg twice daily for 7 days followed by 5 mg twice daily. Pick up at our pharmacy downstairs in the lobby.  -Your refills have been sent to [facility].  -We have referred you to see a hematologist. If you have not heard from them in the next week or so, give them a call to set up your appointment: [phone].  -Talk with your gynecologist to let them know you have been diagnosed with a DVT in the event this may change their management of your estrogen medication.  -Talk with your neurologist about your IVIG (on hold for now) and ask if it is okay if you hold aspirin  while taking Eliquis due to your bleeding risks.   -It is important to take your medication around the same time every day.  -Avoid NSAIDs like ibuprofen  (Advil , Motrin ) and naproxen (Aleve) as well as aspirin  doses over 100 mg daily. -Tylenol  (acetaminophen ) is the preferred over the counter pain medication to lower the risk of bleeding. -Be sure to alert all of your health care providers that you are taking an anticoagulant prior to starting a new medication or having a procedure. -Monitor for signs and symptoms of bleeding (abnormal bruising, prolonged bleeding, nose bleeds, bleeding from gums, discolored urine, black tarry stools). If you have fallen and hit your head OR if your bleeding is severe or not stopping, seek emergency care.  -Go to the emergency room if emergent signs and symptoms of new clot occur (new or worse swelling and pain in an arm or leg, shortness of breath, chest pain, fast or irregular heartbeats, lightheadedness, dizziness, fainting, coughing up blood) or if you experience a significant color change (pale or blue) in the extremity that has the DVT.  -We recommend you wear compression stockings (15-20 mmHg) as long as you are having swelling or pain. Be sure to purchase the correct size and take them off at night.   If you have any questions or need to reschedule an appointment,  please call [phone]. If you are having an emergency, call 911 or present to the nearest emergency room.   What is a DVT?  -Deep vein thrombosis (DVT) is a condition in which a blood clot forms in a vein of the deep venous system which can occur in the lower leg, thigh, pelvis, arm, or neck. This condition is serious and can be life-threatening if the clot travels to the arteries of the lungs and causing a blockage (pulmonary embolism, PE). A DVT can also damage veins in the leg, which can lead to long-term venous disease, leg pain, swelling, discoloration, and ulcers or sores (post-thrombotic syndrome).  -Treatment may include taking an anticoagulant medication to prevent more clots from forming and the current clot from growing, wearing compression stockings, and/or surgical procedures to remove or dissolve the clot.

## 2023-09-20 NOTE — Progress Notes (Signed)
 DVT Clinic Note  Name: [name]     MRN: [id number]     DOB: March 22, 1946     Sex: female  PCP: Suan Elm, MD  Today's Visit: Visit Information: Initial Visit  Referred to DVT Clinic by: [facility] - Dr. Gracie Lav Referred to CPP by: Dr. Susi Eric Reason for referral:  Chief Complaint  Patient presents with   DVT   HISTORY OF PRESENT ILLNESS: [name] is a 78 y.o. female with PMH as below who presents in a wheelchair accompanied by her husband [name] after diagnosis of DVT for medication management. Noticed bilateral lower extremity edema starting over one year ago. Had an ultrasound outside our health system that I am unable to see that she reports was negative at that time. LLE may have possibly gotten worse in the last few weeks but also reports this is consistent with prior swelling. Both legs swelled worse during and after her IVIG infusion, which she started in April. She follows with neurology for panautonomic failure and autoimmune disease. Follows with [facility] for collagenous colitis. Has been more active the past few months as symptoms related to these have started to improve. Denies recent injury, illness, immobility. Reports that she takes aspirin  for prevention and has started to experience nosebleeds recently. She has also developed what she says was diagnosed as TTP on her arms recently. Reports that as a child she bled very easily and was given vitamin K injections.   Positive Thrombotic Risk Factors: Estrogen therapy, Non-malignant, chronic inflammatory condition, Older Age Bleeding Risk Factors: Age >65 years  Negative Thrombotic Risk Factors: Previous VTE, Recent surgery (within 3 months), Recent trauma (within 3 months), Recent admission to hospital with acute illness (within 3 months), Paralysis, paresis, or recent plaster cast immobilization of lower extremity, Central venous catheterization, Bed rest >72 hours within 3 months, Sedentary journey  lasting >8 hours within 4 weeks, Pregnancy, Within 6 weeks postpartum, Recent cesarean section (within 3 months), Testosterone therapy, Erythropoiesis-stimulating agent, Recent COVID diagnosis (within 3 months), Active cancer, Known thrombophilic condition  Rx Insurance Coverage: [employer] Rx Affordability: Eliquis is $43/month - may be less at [facility] due to [employer]. Limits 30 day supplies.  Rx Assistance Provided: Free 30-day trial card Preferred Pharmacy: Filled starter pack at [facility] today. Refills sent to [facility] through [employer].   Past Medical History:  Diagnosis Date   ADHD (attention deficit hyperactivity disorder)    Allergy    trees/pollen, mold, fungus, dust mites. Takes allergy shots   Arthritis    PAIN AND OA LEFT HIP   Asthma    allergist Dr Kozlo- monthly allergy injections   Autonomic dysfunction    CENTRAL NERVOUS SYSTEM NEUROPATHY - DX BY DR. Dania Dupre MORE THAN 10 YRS AGO - AND IT IS FELT TO CONTRIBUTE TO THE AUTOMIC  DYSFUNCTION-- PT HAS NUMBNESS LEGS AND FEET AND SOMETIIMES TIPS OF FINGER, SEVERE CONSTIPATION( NO SENSATION TO HAVE BM ), DOUBLE VISION, ORTHOSTATIC HYPOTENSION   Blood transfusion    Breast cancer (HCC)    right side   Bruises easily    Cataract    Chest pain    a. 12/2012 Cath: nl cors, EF 55-65%.   Complication of anesthesia    reaction to some anesthetics/ 7/12 anesth record on chart- states prefers epidural   CVA (cerebrovascular accident) (HCC) 03/21/2018   Depression    Diverticulosis    Dysrhythmia    HX OF HIGH GRADE HEART BLOCK -  REQUIRED PACEMAKER INSERTION   Esophageal stricture    Gastritis    GERD (gastroesophageal reflux disease)    H/O hiatal hernia    Hemorrhoids    Hernia of abdominal wall    spigelian hernia RLQ - SURGERY TO REPAIR   Hypothyroidism    Interstitial cystitis    Latex allergy, contact dermatitis    Mallory - Weiss tear    HEALED    Neuromuscular disorder  (HCC)    central nervous system neuropathy- seen per Dr Dania Dupre   Pacemaker    Pericardial effusion    a. 12/2012 following ppm placement;  b. 01/01/2013 Echo: EF 55-60%, small pericardial effusion w/o RV collapse-->No need for tap/window.   PONV (postoperative nausea and vomiting)    pt needs scop patch   Recurrent upper respiratory infection (URI) 1/13- to present   bronchitis following surgery- states improved but still with cough. OV with [facility] Dr Delorise Few 09/06/11 on chart   Shortness of breath    AT TIMES - BUT MUCH IMPROVED AFTER PACEMAKER WAS REPROGRAMED.   Symptomatic bradycardia    a. 12/2012 s/p MDT dual chamber PPM, ser # [id number]; b. 12/2012 post-op course complicated by pericardial effusinon req lead revision.   Thyroid  disease     Past Surgical History:  Procedure Laterality Date   ABDOMINAL HYSTERECTOMY  1974   BACK SURGERY     cervical fusion 4-5 with plate   BLADDER SUSPENSION     BREAST BIOPSY  2002   NO BLOOD PRESSURES ON RIGHT SIDE/   s/p  axillary node dissection   CYSTO WITH HYDRODISTENSION  09/07/2011   Procedure: CYSTOSCOPY/HYDRODISTENSION;  Surgeon: Edmund Gouge, MD;  Location: [facility];  Service: Urology;  Laterality: N/A;  INSTILLATION OF MARCAINE /PYRIDIUM  INSTILLATION OF MARCAINE /KENALOG    CYSTOSCOPY  1975, 2007   EYE SURGERY     LASIK EYE SURGERY BILATERAL   LAPAROSCOPY  1973   LEAD REVISION N/A 12/30/2012   Procedure: LEAD REVISION;  Surgeon: Tammie Fall, MD;  Location: [facility] CATH LAB;  Service: Cardiovascular;  Laterality: N/A;   LEFT HEART CATHETERIZATION WITH CORONARY ANGIOGRAM N/A 12/29/2012   Procedure: LEFT HEART CATHETERIZATION WITH CORONARY ANGIOGRAM;  Surgeon: Peter M Swaziland, MD;  Location: [facility] CATH LAB;  Service: Cardiovascular;  Laterality: N/A;   MASTECTOMY MODIFIED RADICAL     right; with immediate reconstruction   MYRINGOPLASTY  1962   NECK SURGERY     c4-5 ruptured disc   OOPHORECTOMY  1982   PACEMAKER INSERTION     PERMANENT  PACEMAKER INSERTION N/A 12/29/2012   Procedure: PERMANENT PACEMAKER INSERTION;  Surgeon: Verona Goodwill, MD;  Location: [facility] CATH LAB;  Service: Cardiovascular;  Laterality: N/A;   PPM GENERATOR CHANGEOUT N/A 02/27/2023   Procedure: PPM GENERATOR CHANGEOUT;  Surgeon: Verona Goodwill, MD;  Location: [facility] INVASIVE CV LAB;  Service: Cardiovascular;  Laterality: N/A;   RECTOCELE REPAIR     with cystocele repair   TONSILLECTOMY     TOTAL HIP ARTHROPLASTY Right    TOTAL HIP ARTHROPLASTY Left 06/12/2013   Procedure: LEFT TOTAL HIP ARTHROPLASTY ANTERIOR APPROACH;  Surgeon: Arnie Lao, MD;  Location: [facility];  Service: Orthopedics;  Laterality: Left;   TYMPANOPLASTY  1973   right   ULNAR NERVE TRANSPOSITION Right    VENTRAL HERNIA REPAIR  05/30/2011   Procedure: HERNIA REPAIR VENTRAL ADULT;  Surgeon: Darcella Earnest, MD;  Location: [facility];  Service: General;  Laterality: Right;  repair right spigelian hernia  Social History   Socioeconomic History   Marital status: Married    Spouse name: Not on file   Number of children: 2   Years of education: 15   Highest education level: Not on file  Occupational History   Occupation: Retired    Associate Professor: [employer]    Comment: vp corporate affairs [employer]    Employer: [employer]  Tobacco Use   Smoking status: Never   Smokeless tobacco: Never  Vaping Use   Vaping status: Never Used  Substance and Sexual Activity   Alcohol  use: Yes    Alcohol /week: 7.0 standard drinks of alcohol     Types: 7 Glasses of wine per week    Comment: occasional   Drug use: No   Sexual activity: Not on file  Other Topics Concern   Not on file  Social History Narrative   Lives at home w/ her husband   Patient drinks 1-2 cup of caffeine daily.   Patient is right handed.   Social Drivers of Corporate investment banker Strain: Not on file  Food Insecurity: No Food Insecurity (04/10/2023)   Hunger Vital Sign     Worried About Running Out of Food in the Last Year: Never true    Ran Out of Food in the Last Year: Never true  Transportation Needs: No Transportation Needs (04/10/2023)   PRAPARE - Administrator, Civil Service (Medical): No    Lack of Transportation (Non-Medical): No  Physical Activity: Not on file  Stress: Not on file  Social Connections: Unknown (09/24/2021)   Received from [facility], [facility]   Social Network    Social Network: Not on file  Intimate Partner Violence: Not At Risk (04/10/2023)   Humiliation, Afraid, Rape, and Kick questionnaire    Fear of Current or Ex-Partner: No    Emotionally Abused: No    Physically Abused: No    Sexually Abused: No    Family History  Problem Relation Age of Onset   Heart disease Father    Colon cancer Mother 40   Depression Mother    Pancreatic cancer Sister 36   Diverticulitis Sister    Uterine cancer Maternal Grandmother    Heart disease Brother    Heart disease Paternal Grandmother    Heart disease Paternal Grandfather    Throat cancer Maternal Uncle    Heart disease Paternal Aunt    Heart disease Paternal Uncle    Heart disease Brother    Thrombosis Maternal Aunt    Cancer Maternal Uncle        NOS   Diabetes Other        aunt    Allergies as of 09/20/2023 - Review Complete 09/20/2023  Allergen Reaction Noted   Anesthetics, amide Other (See Comments) 04/11/2011   Clindamycin  Swelling 01/20/2015   Shellfish allergy Other (See Comments) 01/20/2015   Sulfonamide derivatives Anaphylaxis and Swelling 05/09/2010   Neomycin Swelling 01/20/2015   Zolpidem  tartrate Other (See Comments) 01/20/2015   Azithromycin  Other (See Comments) 01/20/2013   Bactroban Other (See Comments) 08/31/2011   Ciprofloxacin     Codeine Swelling    Meperidine hcl Nausea Only    Morphine  Nausea Only    Neosporin [neomycin-polymyxin-gramicidin] Hives and Dermatitis 08/31/2011   Nitrofurantoin      Oxycodone -acetaminophen    10/23/2021   Penicillins Other (See Comments)    Tramadol   01/07/2014   Latex Rash 05/04/2011    Current Outpatient Medications on File Prior  to Visit  Medication Sig Dispense Refill   acetaminophen  (TYLENOL ) 500 MG tablet Take 1,000 mg by mouth 2 (two) times daily as needed for moderate pain.     aspirin  EC 81 MG EC tablet Take 1 tablet (81 mg total) by mouth daily.     budesonide  (ENTOCORT EC ) 3 MG 24 hr capsule Take 3 mg by mouth daily.     Calcium  Carbonate-Vitamin D  (CALTRATE 600+D PO) Take 1 tablet by mouth daily.     cetirizine (ZYRTEC) 10 MG tablet Take 10 mg by mouth daily.     Cholecalciferol  (VITAMIN D ) 50 MCG (2000 UT) CAPS Take 2,000 Units by mouth daily.     cycloSPORINE (RESTASIS) 0.05 % ophthalmic emulsion Place 1 drop into both eyes 2 (two) times daily as needed (dry eyes).     diclofenac Sodium (VOLTAREN) 1 % GEL Apply 1 application  topically daily as needed (pain).     droxidopa  (NORTHERA ) 100 MG CAPS Take 3 capsules (300 mg total) by mouth 3 (three) times daily with meals. 270 capsule 11   DULoxetine  (CYMBALTA ) 60 MG capsule Take 60 mg by mouth daily.     estradiol (ESTRING) 2 MG vaginal ring Place 2 mg vaginally every 3 (three) months. follow package directions     fludrocortisone  (FLORINEF ) 0.1 MG tablet Take 1 tablet (0.1 mg total) by mouth daily. 270 tablet 3   folic acid (FOLVITE) 400 MCG tablet Take 400 mcg by mouth daily.     levothyroxine  (SYNTHROID ) 50 MCG tablet Take 50 mcg by mouth 3 (three) times a week. Take 50 mcg daily on Tuesday,Thursday, and Sunday     levothyroxine  (SYNTHROID ) 75 MCG tablet Take 75 mcg by mouth 4 (four) times a week. Pt takes on Monday,Wednesday,Friday, and Saturday     Magnesium  250 MG TABS Take 500 mg by mouth daily.     meclizine  (ANTIVERT ) 25 MG tablet Take 25 mg by mouth 3 (three) times daily as needed for dizziness.      Melatonin 5 MG CAPS Take 5 mg by mouth at bedtime.     midodrine  (PROAMATINE ) 10 MG tablet TAKE 2 TABLETS (20  MG TOTAL) BY MOUTH 4 (FOUR) TIMES DAILY. (Patient taking differently: Take 20 mg by mouth 4 (four) times daily. Currently taking three times daily) 720 tablet 3   potassium chloride  (KLOR-CON  M) 10 MEQ tablet Take 2 tablets (20 mEq total) by mouth daily. 180 tablet 3   predniSONE  (DELTASONE ) 10 MG tablet 6 tab every morning x2 weeks,   5 tabs every morning x 2 weeks. 4 tabs every morning 180 tablet 3   Probiotic Product (ALIGN) 4 MG CAPS Take 4 mg by mouth at bedtime.      pyridostigmine  (MESTINON ) 60 MG tablet Take 0.5 tablets (30 mg total) by mouth 2 (two) times daily. 90 tablet 3   rosuvastatin  (CRESTOR ) 10 MG tablet Take 1 tablet (10 mg total) by mouth daily. (Patient taking differently: Take 10 mg by mouth at bedtime.) 90 tablet 0   vitamin B-12 (CYANOCOBALAMIN ) 500 MCG tablet Take 500 mcg by mouth daily.     clonazePAM  (KLONOPIN ) 0.5 MG tablet Half to one tab as needed at night for sleep (Patient not taking: Reported on 09/20/2023) 30 tablet 3   Omega 3 1200 MG CAPS Take 1,200 mg by mouth daily. (Patient not taking: Reported on 09/20/2023)     Current Facility-Administered Medications on File Prior to Visit  Medication Dose Route Frequency Provider Last Rate Last Admin  bupivacaine  (MARCAINE ) 0.5 % 10 mL, triamcinolone  acetonide (KENALOG -40) 40 mg injection   Subcutaneous Once Tannenbaum, Sigmund, MD       bupivacaine  (MARCAINE ) 0.5 % 15 mL, phenazopyridine  (PYRIDIUM ) 400 mg bladder mixture   Bladder Instillation Once Annamarie Kid, MD       REVIEW OF SYSTEMS:  Review of Systems  Respiratory:  Negative for shortness of breath.   Cardiovascular:  Positive for leg swelling. Negative for chest pain and palpitations.  Musculoskeletal:  Positive for myalgias.   PHYSICAL EXAMINATION:  Vitals:   09/20/23 1411  BP: (!) 151/85  Pulse: 72  SpO2: 98%   Physical Exam Vitals reviewed.  Cardiovascular:     Rate and Rhythm: Normal rate.  Musculoskeletal:        General: No tenderness.      Left lower leg: Edema present.  Psychiatric:        Mood and Affect: Mood normal.        Behavior: Behavior normal.        Thought Content: Thought content normal.   Villalta Score for Post-Thrombotic Syndrome: Pain: Absent Cramps: Absent Heaviness: Mild Paresthesia: Absent Pruritus: Absent Pretibial Edema: Mild Skin Induration: Absent Hyperpigmentation: Absent Redness: Absent Venous Ectasia: Absent Pain on calf compression: Absent Villalta Preliminary Score: 2 Is venous ulcer present?: No If venous ulcer is present and score is <15, then 15 points total are assigned: Absent Villalta Total Score: 2  LABS:  CBC     Component Value Date/Time   WBC 8.8 09/19/2023 1548   WBC 8.9 04/10/2023 0421   RBC 4.20 09/19/2023 1548   RBC 4.12 04/10/2023 0421   HGB 13.3 09/19/2023 1548   HCT 39.2 09/19/2023 1548   PLT 288 09/19/2023 1548   MCV 93 09/19/2023 1548   MCH 31.7 09/19/2023 1548   MCH 30.8 04/10/2023 0421   MCHC 33.9 09/19/2023 1548   MCHC 34.0 04/10/2023 0421   RDW 14.0 09/19/2023 1548   LYMPHSABS 1.7 09/19/2023 1548   MONOABS 0.8 04/09/2023 2034   EOSABS 0.0 09/19/2023 1548   BASOSABS 0.0 09/19/2023 1548    Hepatic Function      Component Value Date/Time   PROT 8.5 09/19/2023 1548   ALBUMIN 4.3 09/19/2023 1548   AST 90 (H) 09/19/2023 1548   ALT 151 (H) 09/19/2023 1548   ALKPHOS 87 09/19/2023 1548   BILITOT 0.4 09/19/2023 1548   BILIDIR 0.15 12/09/2019 1117    Renal Function   Lab Results  Component Value Date   CREATININE 0.89 09/19/2023   CREATININE 0.78 04/09/2023   CREATININE 0.80 04/09/2023    Estimated Creatinine Clearance: 49.8 mL/min (by C-G formula based on SCr of 0.89 mg/dL).   [facility] Vascular Lab Studies:  09/20/23 VAS US  LOWER EXTREMITY VENOUS LEFT (DVT)  Summary:  RIGHT:  - No evidence of common femoral vein obstruction.    LEFT:  - Findings consistent with age indeterminate deep vein thrombosis  involving the left posterior tibial veins,  and left peroneal veins.   ASSESSMENT: Location of DVT: Left distal vein  Patient without prior history of VTE diagnosed with age indeterminate DVT in the left posterior tibial and peroneal veins. Onset of symptoms is difficult to elucidate per patient history. Reports lower extremity swelling for over one year with possible worsening of the LLE recently but she also feels like it has not changed much recently. LLE is not significantly swollen today and there is no pain. She was recently started on IVIG in April, which was  put on hold today by her neurologist after DVT diagnosis due to its thrombotic risk. Her autoimmune conditions can increase thrombotic risk as well. She also uses a vaginal estrogen ring, which compared to other forms of estrogen has less systemic absorption and she has been on this for many years. Encouraged her to notify her gynecologist of DVT diagnosis in the event their management of this will change. As some of these risk factors are likely to remain persistent and the timing of risk factor presentation is unclear with the timing of symptom onset, will also refer her to hematology for further workup and management of duration of anticoagulation.   Will start anticoagulation with Eliquis. Patient has risk factors for bleeding including being easy to bruise/bleed at baseline and reporting nosebleeds recently that she feels are related to aspirin . No recent falls and no active bleeding. Neurology was last to recommend she continue aspirin  after her 03/2023 admission. She will follow up with neurology whether she should hold aspirin  while on Eliquis. This would help to lower her risk of bleeding. Counseled patient extensively on Eliquis, and all questions were answered. The VTE starter pack was filled during the visit today and refills sent to her preferred pharmacy. No barriers to medication adherence or access were identified.   PLAN: -Start apixaban (Eliquis) 10 mg twice daily for 7  days followed by 5 mg twice daily. -Expected duration of therapy: per hematology. Therapy started on 09/20/23. -Patient educated on purpose, proper use and potential adverse effects of apixaban (Eliquis). -Discussed importance of taking medication around the same time every day. -Advised patient of medications to avoid (NSAIDs, aspirin  doses >100 mg daily). -Educated that Tylenol  (acetaminophen ) is the preferred analgesic to lower the risk of bleeding. -Advised patient to alert all providers of anticoagulation therapy prior to starting a new medication or having a procedure. -Emphasized importance of monitoring for signs and symptoms of bleeding (abnormal bruising, prolonged bleeding, nose bleeds, bleeding from gums, discolored urine, black tarry stools). -Educated patient to present to the ED if emergent signs and symptoms of new thrombosis occur. -Counseled patient to wear compression stockings daily, removing at night. Counseled on proper leg elevation.   Follow up: Referred to hematology. DVT Clinic available as needed.   Faye Hoops, PharmD, [place], CPP Deep Vein Thrombosis Clinic Clinical Pharmacist Practitioner [phone]

## 2023-09-20 NOTE — Telephone Encounter (Signed)
 Call from [name] called, Patient is positive for lower left leg DVT, going to DVT clinic.   Discussed with Dr. Gracie Lav, hold IVIG indefinitely at this time. Discussed with [name] in infusion suite, relayed Dr, Gracie Lav orders. [name] will relay message to patient we are holding IVIg and to reach out next week,

## 2023-09-22 ENCOUNTER — Encounter: Payer: Self-pay | Admitting: Neurology

## 2023-09-23 ENCOUNTER — Telehealth: Payer: Self-pay | Admitting: Neurology

## 2023-09-23 ENCOUNTER — Ambulatory Visit: Payer: Medicare Other

## 2023-09-23 ENCOUNTER — Encounter: Payer: Self-pay | Admitting: Neurology

## 2023-09-23 NOTE — Telephone Encounter (Signed)
 Pt Husband [name]) called to confirm appt details .  Appt Detail Confirm

## 2023-09-25 ENCOUNTER — Encounter: Payer: Self-pay | Admitting: Neurology

## 2023-09-26 DIAGNOSIS — F320 Major depressive disorder, single episode, mild: Secondary | ICD-10-CM | POA: Diagnosis not present

## 2023-10-08 ENCOUNTER — Other Ambulatory Visit: Payer: Self-pay

## 2023-10-08 ENCOUNTER — Encounter (HOSPITAL_COMMUNITY): Payer: Self-pay

## 2023-10-08 ENCOUNTER — Emergency Department (HOSPITAL_COMMUNITY)

## 2023-10-08 ENCOUNTER — Emergency Department (HOSPITAL_COMMUNITY)
Admission: EM | Admit: 2023-10-08 | Discharge: 2023-10-08 | Disposition: A | Discharge status: Home / Self Care | Attending: Emergency Medicine | Admitting: Emergency Medicine

## 2023-10-08 DIAGNOSIS — Z91040 Latex allergy status: Secondary | ICD-10-CM | POA: Insufficient documentation

## 2023-10-08 DIAGNOSIS — R29818 Other symptoms and signs involving the nervous system: Secondary | ICD-10-CM

## 2023-10-08 DIAGNOSIS — Z7901 Long term (current) use of anticoagulants: Secondary | ICD-10-CM | POA: Diagnosis not present

## 2023-10-08 DIAGNOSIS — R200 Anesthesia of skin: Secondary | ICD-10-CM | POA: Insufficient documentation

## 2023-10-08 DIAGNOSIS — R202 Paresthesia of skin: Secondary | ICD-10-CM

## 2023-10-08 DIAGNOSIS — M6281 Muscle weakness (generalized): Secondary | ICD-10-CM | POA: Diagnosis not present

## 2023-10-08 DIAGNOSIS — R9089 Other abnormal findings on diagnostic imaging of central nervous system: Secondary | ICD-10-CM | POA: Diagnosis not present

## 2023-10-08 DIAGNOSIS — R030 Elevated blood-pressure reading, without diagnosis of hypertension: Secondary | ICD-10-CM | POA: Insufficient documentation

## 2023-10-08 DIAGNOSIS — Z7982 Long term (current) use of aspirin: Secondary | ICD-10-CM | POA: Insufficient documentation

## 2023-10-08 DIAGNOSIS — Z8673 Personal history of transient ischemic attack (TIA), and cerebral infarction without residual deficits: Secondary | ICD-10-CM | POA: Diagnosis not present

## 2023-10-08 DIAGNOSIS — R519 Headache, unspecified: Secondary | ICD-10-CM | POA: Diagnosis not present

## 2023-10-08 DIAGNOSIS — Z79899 Other long term (current) drug therapy: Secondary | ICD-10-CM | POA: Diagnosis not present

## 2023-10-08 DIAGNOSIS — R29810 Facial weakness: Secondary | ICD-10-CM | POA: Diagnosis not present

## 2023-10-08 DIAGNOSIS — I672 Cerebral atherosclerosis: Secondary | ICD-10-CM | POA: Diagnosis not present

## 2023-10-08 DIAGNOSIS — Z853 Personal history of malignant neoplasm of breast: Secondary | ICD-10-CM | POA: Diagnosis not present

## 2023-10-08 DIAGNOSIS — I6782 Cerebral ischemia: Secondary | ICD-10-CM | POA: Diagnosis not present

## 2023-10-08 DIAGNOSIS — Z79890 Hormone replacement therapy (postmenopausal): Secondary | ICD-10-CM | POA: Insufficient documentation

## 2023-10-08 DIAGNOSIS — I10 Essential (primary) hypertension: Secondary | ICD-10-CM | POA: Diagnosis not present

## 2023-10-08 DIAGNOSIS — I639 Cerebral infarction, unspecified: Secondary | ICD-10-CM | POA: Diagnosis not present

## 2023-10-08 DIAGNOSIS — E039 Hypothyroidism, unspecified: Secondary | ICD-10-CM | POA: Insufficient documentation

## 2023-10-08 DIAGNOSIS — Z950 Presence of cardiac pacemaker: Secondary | ICD-10-CM | POA: Diagnosis not present

## 2023-10-08 DIAGNOSIS — R000 Tachycardia, unspecified: Secondary | ICD-10-CM | POA: Diagnosis not present

## 2023-10-08 DIAGNOSIS — R531 Weakness: Secondary | ICD-10-CM | POA: Diagnosis not present

## 2023-10-08 DIAGNOSIS — I6529 Occlusion and stenosis of unspecified carotid artery: Secondary | ICD-10-CM | POA: Diagnosis not present

## 2023-10-08 LAB — COMPREHENSIVE METABOLIC PANEL WITH GFR
ALT: 78 U/L — ABNORMAL HIGH (ref 0–44)
AST: 64 U/L — ABNORMAL HIGH (ref 15–41)
Albumin: 3.9 g/dL (ref 3.5–5.0)
Alkaline Phosphatase: 54 U/L (ref 38–126)
Anion gap: 13 (ref 5–15)
BUN: 14 mg/dL (ref 8–23)
CO2: 22 mmol/L (ref 22–32)
Calcium: 9.2 mg/dL (ref 8.9–10.3)
Chloride: 103 mmol/L (ref 98–111)
Creatinine, Ser: 0.86 mg/dL (ref 0.44–1.00)
GFR, Estimated: >60 mL/min (ref >60)
Glucose, Bld: 130 mg/dL — ABNORMAL HIGH (ref 70–99)
Potassium: 3.9 mmol/L (ref 3.5–5.1)
Sodium: 138 mmol/L (ref 135–145)
Total Bilirubin: 0.9 mg/dL (ref 0.0–1.2)
Total Protein: 7.1 g/dL (ref 6.5–8.1)

## 2023-10-08 LAB — CBC
HCT: 39.3 % (ref 36.0–46.0)
Hemoglobin: 12.5 g/dL (ref 12.0–15.0)
MCH: 31.8 pg (ref 26.0–34.0)
MCHC: 31.8 g/dL (ref 30.0–36.0)
MCV: 100 fL (ref 80.0–100.0)
Platelets: 251 10*3/uL (ref 150–400)
RBC: 3.93 MIL/uL (ref 3.87–5.11)
RDW: 15.5 % (ref 11.5–15.5)
WBC: 9 10*3/uL (ref 4.0–10.5)
nRBC: 0 % (ref 0.0–0.2)

## 2023-10-08 LAB — DIFFERENTIAL
Abs Immature Granulocytes: 0.06 10*3/uL (ref 0.00–0.07)
Basophils Absolute: 0 10*3/uL (ref 0.0–0.1)
Basophils Relative: 0 %
Eosinophils Absolute: 0 10*3/uL (ref 0.0–0.5)
Eosinophils Relative: 0 %
Immature Granulocytes: 1 %
Lymphocytes Relative: 27 %
Lymphs Abs: 2.4 10*3/uL (ref 0.7–4.0)
Monocytes Absolute: 0.4 10*3/uL (ref 0.1–1.0)
Monocytes Relative: 5 %
Neutro Abs: 6.1 10*3/uL (ref 1.7–7.7)
Neutrophils Relative %: 67 %

## 2023-10-08 LAB — I-STAT CHEM 8, ED
BUN: 16 mg/dL (ref 8–23)
Calcium, Ion: 1.11 mmol/L — ABNORMAL LOW (ref 1.15–1.40)
Chloride: 104 mmol/L (ref 98–111)
Creatinine, Ser: 0.8 mg/dL (ref 0.44–1.00)
Glucose, Bld: 128 mg/dL — ABNORMAL HIGH (ref 70–99)
HCT: 38 % (ref 36.0–46.0)
Hemoglobin: 12.9 g/dL (ref 12.0–15.0)
Potassium: 3.8 mmol/L (ref 3.5–5.1)
Sodium: 140 mmol/L (ref 135–145)
TCO2: 24 mmol/L (ref 22–32)

## 2023-10-08 LAB — ETHANOL: Alcohol, Ethyl (B): <15 mg/dL (ref <15)

## 2023-10-08 LAB — CBG MONITORING, ED: Glucose-Capillary: 120 mg/dL — ABNORMAL HIGH (ref 70–99)

## 2023-10-08 LAB — APTT: aPTT: 23 s — ABNORMAL LOW (ref 24–36)

## 2023-10-08 LAB — PROTIME-INR
INR: 1 (ref 0.8–1.2)
Prothrombin Time: 13.3 s (ref 11.4–15.2)

## 2023-10-08 MED ORDER — IOHEXOL 350 MG/ML SOLN
75.0000 mL | Freq: Once | INTRAVENOUS | Status: AC | PRN
  Administered 2023-10-08: 75 mL via INTRAVENOUS

## 2023-10-08 NOTE — Consult Note (Signed)
 NEUROLOGY CONSULT NOTE   Date of service: Oct 08, 2023 Patient Name: [name] MRN:  [id number] DOB:  11/24/1945 Chief Complaint: "Code stroke " Requesting Provider: Craige Dixon, *  History of Present Illness  [name] is a 78 y.o. female with hx of recent Left DVT on Eliquis , orthostatic hypotension, PA fib, ADHD, hx of breast cancer, autonomic dysfunction, depression, prior CVA, complex migraines, GERD, sympotmatic bradycardia s/p PPM, hypothyroidism who presents via EMS coming from therapy. LKW 1500. Code stroke was activated by EMS. SBP with EMS in the 200's. She acutely developed tingling on the back of her head that then went forward to the top of her her head, developed right facial droop, and right arm and right leg weakness. NIHSS 4. She denies actual headache. She has a history of complex migraines however this is very different from her prior complex migraines.  CT head with no acute process. CTA head and neck with no LVO. Will obtain MRI brain.   LKW: 1500 Modified rankin score: 0-Completely asymptomatic and back to baseline post- stroke IV Thrombolysis:  No likely not a stroke  EVT:  No LVO   NIHSS components Score: Comment  1a Level of Conscious 0[x]  1[]  2[]  3[]      1b LOC Questions 0[x]  1[]  2[]       1c LOC Commands 0[x]  1[]  2[]       2 Best Gaze 0[x]  1[]  2[]       3 Visual 0[]  1[]  2[]  3[]      4 Facial Palsy 0[]  1[x]  2[]  3[]      5a Motor Arm - left 0[x]  1[]  2[]  3[]  4[]  UN[]    5b Motor Arm - Right 0[]  1[x]  2[]  3[]  4[]  UN[]    6a Motor Leg - Left 0[x]  1[]  2[]  3[]  4[]  UN[]    6b Motor Leg - Right 0[]  1[x]  2[]  3[]  4[]  UN[]    7 Limb Ataxia 0[x]  1[]  2[]  UN[]      8 Sensory 0[]  1[x]  2[]  UN[]      9 Best Language 0[x]  1[]  2[]  3[]      10 Dysarthria 0[x]  1[]  2[]  UN[]      11 Extinct. and Inattention 0[x]  1[]  2[]       TOTAL: 4      ROS   Comprehensive ROS performed and pertinent positives documented in HPI    Past History   Past Medical History:   Diagnosis Date   ADHD (attention deficit hyperactivity disorder)    Allergy    trees/pollen, mold, fungus, dust mites. Takes allergy shots   Arthritis    PAIN AND OA LEFT HIP   Asthma    allergist Dr Kozlo- monthly allergy injections   Autonomic dysfunction    CENTRAL NERVOUS SYSTEM NEUROPATHY - DX BY DR. Dania Dupre MORE THAN 10 YRS AGO - AND IT IS FELT TO CONTRIBUTE TO THE AUTOMIC  DYSFUNCTION-- PT HAS NUMBNESS LEGS AND FEET AND SOMETIIMES TIPS OF FINGER, SEVERE CONSTIPATION( NO SENSATION TO HAVE BM ), DOUBLE VISION, ORTHOSTATIC HYPOTENSION   Blood transfusion    Breast cancer (HCC)    right side   Bruises easily    Cataract    Chest pain    a. 12/2012 Cath: nl cors, EF 55-65%.   Complication of anesthesia    reaction to some anesthetics/ 7/12 anesth record on chart- states prefers epidural   CVA (cerebrovascular accident) (HCC) 03/21/2018   Depression    Diverticulosis    Dysrhythmia    HX OF HIGH GRADE HEART BLOCK - REQUIRED PACEMAKER  INSERTION   Esophageal stricture    Gastritis    GERD (gastroesophageal reflux disease)    H/O hiatal hernia    Hemorrhoids    Hernia of abdominal wall    spigelian hernia RLQ - SURGERY TO REPAIR   Hypothyroidism    Interstitial cystitis    Latex allergy, contact dermatitis    Mallory - Weiss tear    HEALED    Neuromuscular disorder (HCC)    central nervous system neuropathy- seen per Dr Dania Dupre   Pacemaker    Pericardial effusion    a. 12/2012 following ppm placement;  b. 01/01/2013 Echo: EF 55-60%, small pericardial effusion w/o RV collapse-->No need for tap/window.   PONV (postoperative nausea and vomiting)    pt needs scop patch   Recurrent upper respiratory infection (URI) 1/13- to present   bronchitis following surgery- states improved but still with cough. OV with [facility] Dr Delorise Few 09/06/11 on chart   Shortness of breath    AT TIMES - BUT MUCH IMPROVED AFTER PACEMAKER WAS REPROGRAMED.   Symptomatic bradycardia    a. 12/2012 s/p MDT dual  chamber PPM, ser # [id number]; b. 12/2012 post-op course complicated by pericardial effusinon req lead revision.   Thyroid  disease     Past Surgical History:  Procedure Laterality Date   ABDOMINAL HYSTERECTOMY  1974   BACK SURGERY     cervical fusion 4-5 with plate   BLADDER SUSPENSION     BREAST BIOPSY  2002   NO BLOOD PRESSURES ON RIGHT SIDE/   s/p  axillary node dissection   CYSTO WITH HYDRODISTENSION  09/07/2011   Procedure: CYSTOSCOPY/HYDRODISTENSION;  Surgeon: Edmund Gouge, MD;  Location: [facility];  Service: Urology;  Laterality: N/A;  INSTILLATION OF MARCAINE /PYRIDIUM  INSTILLATION OF MARCAINE /KENALOG    CYSTOSCOPY  1975, 2007   EYE SURGERY     LASIK EYE SURGERY BILATERAL   LAPAROSCOPY  1973   LEAD REVISION N/A 12/30/2012   Procedure: LEAD REVISION;  Surgeon: Tammie Fall, MD;  Location: [facility] CATH LAB;  Service: Cardiovascular;  Laterality: N/A;   LEFT HEART CATHETERIZATION WITH CORONARY ANGIOGRAM N/A 12/29/2012   Procedure: LEFT HEART CATHETERIZATION WITH CORONARY ANGIOGRAM;  Surgeon: Peter M Swaziland, MD;  Location: [facility] CATH LAB;  Service: Cardiovascular;  Laterality: N/A;   MASTECTOMY MODIFIED RADICAL     right; with immediate reconstruction   MYRINGOPLASTY  1962   NECK SURGERY     c4-5 ruptured disc   OOPHORECTOMY  1982   PACEMAKER INSERTION     PERMANENT PACEMAKER INSERTION N/A 12/29/2012   Procedure: PERMANENT PACEMAKER INSERTION;  Surgeon: Verona Goodwill, MD;  Location: [facility] CATH LAB;  Service: Cardiovascular;  Laterality: N/A;   PPM GENERATOR CHANGEOUT N/A 02/27/2023   Procedure: PPM GENERATOR CHANGEOUT;  Surgeon: Verona Goodwill, MD;  Location: [facility] INVASIVE CV LAB;  Service: Cardiovascular;  Laterality: N/A;   RECTOCELE REPAIR     with cystocele repair   TONSILLECTOMY     TOTAL HIP ARTHROPLASTY Right    TOTAL HIP ARTHROPLASTY Left 06/12/2013   Procedure: LEFT TOTAL HIP ARTHROPLASTY ANTERIOR APPROACH;  Surgeon: Arnie Lao, MD;  Location: [facility];  Service:  Orthopedics;  Laterality: Left;   TYMPANOPLASTY  1973   right   ULNAR NERVE TRANSPOSITION Right    VENTRAL HERNIA REPAIR  05/30/2011   Procedure: HERNIA REPAIR VENTRAL ADULT;  Surgeon: Darcella Earnest, MD;  Location: [facility];  Service: General;  Laterality: Right;  repair right spigelian hernia  Family History: Family History  Problem Relation Age of Onset   Heart disease Father    Colon cancer Mother 50   Depression Mother    Pancreatic cancer Sister 90   Diverticulitis Sister    Uterine cancer Maternal Grandmother    Heart disease Brother    Heart disease Paternal Grandmother    Heart disease Paternal Grandfather    Throat cancer Maternal Uncle    Heart disease Paternal Aunt    Heart disease Paternal Uncle    Heart disease Brother    Thrombosis Maternal Aunt    Cancer Maternal Uncle        NOS   Diabetes Other        aunt    Social History  reports that she has never smoked. She has never used smokeless tobacco. She reports current alcohol  use of about 7.0 standard drinks of alcohol  per week. She reports that she does not use drugs.  Allergies  Allergen Reactions   Anesthetics, Amide Other (See Comments)    Patient unsure of names, however multiples cause swelling of airway & nausea   Clindamycin  Swelling   Shellfish Allergy Other (See Comments)    Neurotoxic reaction   Sulfonamide Derivatives Anaphylaxis and Swelling   Neomycin Swelling   Zolpidem  Tartrate Other (See Comments)    Jerking motions    Azithromycin  Other (See Comments)    Severe gastritis    Bactroban Other (See Comments)    Causes sores in nose   Ciprofloxacin     joint swelling   Codeine Swelling   Meperidine Hcl Nausea Only    Hallucinations    Morphine  Nausea Only    Hallucinations    Neosporin [Neomycin-Polymyxin-Gramicidin] Hives and Dermatitis    All topical "orin's ointment"   Nitrofurantoin      neuropathy in legs   Oxycodone -Acetaminophen       Dizziness  Other Reaction(s): Dizziness   Penicillins Other (See Comments)    Swelling in joints   Tramadol      Makes jerk   Latex Rash    Medications  No current facility-administered medications for this encounter.  Current Outpatient Medications:    acetaminophen  (TYLENOL ) 500 MG tablet, Take 1,000 mg by mouth 2 (two) times daily as needed for moderate pain., Disp: , Rfl:    apixaban  (ELIQUIS ) 5 MG TABS tablet, Take 1 tablet (5 mg total) by mouth 2 (two) times daily. Start taking after completion of starter pack., Disp: 60 tablet, Rfl: 4   APIXABAN  (ELIQUIS ) VTE STARTER PACK (10MG  AND 5MG ), Take as directed on package: start with two-5mg  tablets twice daily for 7 days. On day 8, switch to one-5mg  tablet twice daily., Disp: 74 each, Rfl: 0   aspirin  EC 81 MG EC tablet, Take 1 tablet (81 mg total) by mouth daily., Disp: , Rfl:    budesonide  (ENTOCORT EC ) 3 MG 24 hr capsule, Take 3 mg by mouth daily., Disp: , Rfl:    Calcium  Carbonate-Vitamin D  (CALTRATE 600+D PO), Take 1 tablet by mouth daily., Disp: , Rfl:    cetirizine (ZYRTEC) 10 MG tablet, Take 10 mg by mouth daily., Disp: , Rfl:    Cholecalciferol  (VITAMIN D ) 50 MCG (2000 UT) CAPS, Take 2,000 Units by mouth daily., Disp: , Rfl:    clonazePAM  (KLONOPIN ) 0.5 MG tablet, Half to one tab as needed at night for sleep (Patient not taking: Reported on 09/20/2023), Disp: 30 tablet, Rfl: 3   cycloSPORINE (RESTASIS) 0.05 % ophthalmic emulsion, Place 1 drop into both  eyes 2 (two) times daily as needed (dry eyes)., Disp: , Rfl:    diclofenac Sodium (VOLTAREN) 1 % GEL, Apply 1 application  topically daily as needed (pain)., Disp: , Rfl:    droxidopa  (NORTHERA ) 100 MG CAPS, Take 3 capsules (300 mg total) by mouth 3 (three) times daily with meals., Disp: 270 capsule, Rfl: 11   DULoxetine  (CYMBALTA ) 60 MG capsule, Take 60 mg by mouth daily., Disp: , Rfl:    estradiol (ESTRING) 2 MG vaginal ring, Place 2 mg vaginally every 3 (three) months. follow  package directions, Disp: , Rfl:    fludrocortisone  (FLORINEF ) 0.1 MG tablet, Take 1 tablet (0.1 mg total) by mouth daily., Disp: 270 tablet, Rfl: 3   folic acid (FOLVITE) 400 MCG tablet, Take 400 mcg by mouth daily., Disp: , Rfl:    levothyroxine  (SYNTHROID ) 50 MCG tablet, Take 50 mcg by mouth 3 (three) times a week. Take 50 mcg daily on Tuesday,Thursday, and Sunday, Disp: , Rfl:    levothyroxine  (SYNTHROID ) 75 MCG tablet, Take 75 mcg by mouth 4 (four) times a week. Pt takes on Monday,Wednesday,Friday, and Saturday, Disp: , Rfl:    Magnesium  250 MG TABS, Take 500 mg by mouth daily., Disp: , Rfl:    meclizine  (ANTIVERT ) 25 MG tablet, Take 25 mg by mouth 3 (three) times daily as needed for dizziness. , Disp: , Rfl:    Melatonin 5 MG CAPS, Take 5 mg by mouth at bedtime., Disp: , Rfl:    midodrine  (PROAMATINE ) 10 MG tablet, TAKE 2 TABLETS (20 MG TOTAL) BY MOUTH 4 (FOUR) TIMES DAILY. (Patient taking differently: Take 20 mg by mouth 4 (four) times daily. Currently taking three times daily), Disp: 720 tablet, Rfl: 3   Omega 3 1200 MG CAPS, Take 1,200 mg by mouth daily. (Patient not taking: Reported on 09/20/2023), Disp: , Rfl:    potassium chloride  (KLOR-CON  M) 10 MEQ tablet, Take 2 tablets (20 mEq total) by mouth daily., Disp: 180 tablet, Rfl: 3   predniSONE  (DELTASONE ) 10 MG tablet, 6 tab every morning x2 weeks,   5 tabs every morning x 2 weeks. 4 tabs every morning, Disp: 180 tablet, Rfl: 3   Probiotic Product (ALIGN) 4 MG CAPS, Take 4 mg by mouth at bedtime. , Disp: , Rfl:    pyridostigmine  (MESTINON ) 60 MG tablet, Take 0.5 tablets (30 mg total) by mouth 2 (two) times daily., Disp: 90 tablet, Rfl: 3   rosuvastatin  (CRESTOR ) 10 MG tablet, Take 1 tablet (10 mg total) by mouth daily. (Patient taking differently: Take 10 mg by mouth at bedtime.), Disp: 90 tablet, Rfl: 0   vitamin B-12 (CYANOCOBALAMIN ) 500 MCG tablet, Take 500 mcg by mouth daily., Disp: , Rfl:   Facility-Administered Medications Ordered in  Other Encounters:    bupivacaine  (MARCAINE ) 0.5 % 10 mL, triamcinolone  acetonide (KENALOG -40) 40 mg injection, , Subcutaneous, Once, Annamarie Kid, MD   bupivacaine  (MARCAINE ) 0.5 % 15 mL, phenazopyridine  (PYRIDIUM ) 400 mg bladder mixture, , Bladder Instillation, Once, Annamarie Kid, MD  Vitals   Vitals:   10/08/23 1627  Weight: 71.8 kg    Body mass index is 24.79 kg/m.  Physical Exam   Constitutional: Appears well-developed and well-nourished.   Psych: Affect appropriate to situation.   Eyes: No scleral injection.   HENT: No OP obstruction.   Head: Normocephalic.   Cardiovascular: Normal rate and regular rhythm.   Respiratory: Effort normal, non-labored breathing.   GI: Soft.  No distension. There is no tenderness.   Skin: WDI.  Neurologic Examination    Mental Status -  Level of arousal and orientation to time, place, and person were intact. Language including expression, naming, repetition, comprehension was assessed and found intact. Attention span and concentration were normal. Recent and remote memory were intact. Fund of Knowledge was assessed and was intact.  Cranial Nerves II - XII - II - Visual field intact OU. III, IV, VI - Extraocular movements intact. V - Facial sensation intact bilaterally. VII - right facial droop VIII - Hearing & vestibular intact bilaterally . X - Palate elevates symmetrically . XI - Chin turning & shoulder shrug intact bilaterally . XII - Tongue protrusion intact .  Motor Strength - The patient's strength was normal in left arm and left leg. Right arm and right leg 4/5 with drift   Bulk was normal and fasciculations were absent .   Motor Tone - Muscle tone was assessed at the neck and appendages and was normal . Sensory - decreased on right  Coordination - The patient had normal movements in the hands and feet with no ataxia or dysmetria.  Tremor was absent. Gait and Station - deferred.  Labs/Imaging/Neurodiagnostic  studies   CBC:  Recent Labs  Lab 2023/10/28 1624 10/28/2023 1629  WBC 9.0  --   NEUTROABS 6.1  --   HGB 12.5 12.9  HCT 39.3 38.0  MCV 100.0  --   PLT 251  --    Basic Metabolic Panel:  Lab Results  Component Value Date   NA 140 28-Oct-2023   K 3.8 10-28-2023   CO2 21 09/19/2023   GLUCOSE 128 (H) 2023-10-28   BUN 16 10-28-2023   CREATININE 0.80 October 28, 2023   CALCIUM  9.4 09/19/2023   GFRNONAA >60 04/09/2023   GFRAA 100 04/19/2020   Lipid Panel:  Lab Results  Component Value Date   LDLCALC 53 04/10/2023   HgbA1c:  Lab Results  Component Value Date   HGBA1C 6.1 (H) 09/19/2023   Urine Drug Screen:     Component Value Date/Time   LABOPIA NONE DETECTED 04/09/2023 2150   COCAINSCRNUR NONE DETECTED 04/09/2023 2150   LABBENZ NONE DETECTED 04/09/2023 2150   AMPHETMU NONE DETECTED 04/09/2023 2150   THCU NONE DETECTED 04/09/2023 2150   LABBARB NONE DETECTED 04/09/2023 2150    Alcohol  Level     Component Value Date/Time   ETH <10 04/09/2023 2034   INR  Lab Results  Component Value Date   INR 1.0 04/09/2023   APTT  Lab Results  Component Value Date   APTT 26 04/09/2023   CT Head without contrast(Personally reviewed): No acute process   CT angio Head and Neck with contrast(Personally reviewed): No LVO   MRI Brain(Personally reviewed): Pending     ASSESSMENT   [name] is a 78 y.o. female hx of recent Left DVT on Eliquis , orthostatic hypotension, ADHD, hx of breast cancer, autonomic dysfunction, depression, prior CVA, GERD, s/p PPM, hypothyroidism who presents via EMS coming from therapy. LKW 1500. Code stroke was activated by EMS. SBP with EMS in the 200's. he acutely developed tingling on the back of her head that then went forward to the top of her her head, developed right facial droop, and right arm and right leg weakness.  RECOMMENDATIONS  Obtain MRI brain. If positive for stroke will continue with stroke workup  Treat headache   ______________________________________________________________________    Signed, Laymond Priestly, NP [facility]  I have seen the patient and reviewed the above note.  She has  a history of complicated migraine and some components of her exam including onset with headache/paresthesia mainly suspect that this is the more likely diagnosis.  She is also certainly at risk for stroke and therefore I would recommend getting an MRI, however if this is negative I would favor treating this as complicated migraine.  Even if TIA, she does not have any stenosis and is already anticoagulated and therefore if MRI is negative, I do not think she would get very much from admission for this diagnosis.  1) MRI brain, if negative would treat as complicated migraine.  Ann Keto, MD [facility]   If 7pm- 7am, please page neurology on call as listed in AMION.

## 2023-10-08 NOTE — ED Notes (Signed)
 Called and placed PT monitor with CCMD

## 2023-10-08 NOTE — ED Provider Notes (Signed)
 [place] EMERGENCY DEPARTMENT AT [facility] Provider Note   CSN: [id number] Arrival date & time: 10/08/23  1622     History  No chief complaint on file.   [name] is a 78 y.o. female.  The history is provided by the patient and medical records. No language interpreter was used.  Neurologic Problem This is a new problem. The current episode started 1 to 2 hours ago. The problem occurs constantly. The problem has been rapidly improving. Pertinent negatives include no chest pain, no abdominal pain, no headaches and no shortness of breath. Nothing aggravates the symptoms. Nothing relieves the symptoms. She has tried nothing for the symptoms. The treatment provided no relief.       Home Medications Prior to Admission medications   Medication Sig Start Date End Date Taking? Authorizing Provider  acetaminophen  (TYLENOL ) 500 MG tablet Take 1,000 mg by mouth 2 (two) times daily as needed for moderate pain.    [provider]  apixaban  (ELIQUIS ) 5 MG TABS tablet Take 1 tablet (5 mg total) by mouth 2 (two) times daily. Start taking after completion of starter pack. 09/20/23   Philipp Brawn, MD  APIXABAN  (ELIQUIS ) VTE STARTER PACK (10MG  AND 5MG ) Take as directed on package: start with two-5mg  tablets twice daily for 7 days. On day 8, switch to one-5mg  tablet twice daily. 09/20/23   Faye Hoops B, RPH-CPP  aspirin  EC 81 MG EC tablet Take 1 tablet (81 mg total) by mouth daily. 03/25/18   Rodman Clam, NP  budesonide  (ENTOCORT EC ) 3 MG 24 hr capsule Take 3 mg by mouth daily.    [provider]  Calcium  Carbonate-Vitamin D  (CALTRATE 600+D PO) Take 1 tablet by mouth daily.    [provider]  cetirizine (ZYRTEC) 10 MG tablet Take 10 mg by mouth daily.    [provider]  Cholecalciferol  (VITAMIN D ) 50 MCG (2000 UT) CAPS Take 2,000 Units by mouth daily.    [provider]  clonazePAM  (KLONOPIN ) 0.5 MG tablet Half to one tab as  needed at night for sleep Patient not taking: Reported on 09/20/2023 06/18/23   Phebe Brasil, MD  cycloSPORINE (RESTASIS) 0.05 % ophthalmic emulsion Place 1 drop into both eyes 2 (two) times daily as needed (dry eyes). 12/13/20   [provider]  diclofenac Sodium (VOLTAREN) 1 % GEL Apply 1 application  topically daily as needed (pain).    [provider]  droxidopa  (NORTHERA ) 100 MG CAPS Take 3 capsules (300 mg total) by mouth 3 (three) times daily with meals. 07/08/23   Phebe Brasil, MD  DULoxetine  (CYMBALTA ) 60 MG capsule Take 60 mg by mouth daily.    [provider]  estradiol (ESTRING) 2 MG vaginal ring Place 2 mg vaginally every 3 (three) months. follow package directions    [provider]  fludrocortisone  (FLORINEF ) 0.1 MG tablet Take 1 tablet (0.1 mg total) by mouth daily. 02/27/23   Verona Goodwill, MD  folic acid (FOLVITE) 400 MCG tablet Take 400 mcg by mouth daily.    [provider]  levothyroxine  (SYNTHROID ) 50 MCG tablet Take 50 mcg by mouth 3 (three) times a week. Take 50 mcg daily on Tuesday,Thursday, and Sunday    [provider]  levothyroxine  (SYNTHROID ) 75 MCG tablet Take 75 mcg by mouth 4 (four) times a week. Pt takes on Monday,Wednesday,Friday, and Saturday    [provider]  Magnesium  250 MG TABS Take 500 mg by mouth daily.  [provider]  meclizine  (ANTIVERT ) 25 MG tablet Take 25 mg by mouth 3 (three) times daily as needed for dizziness.     [provider]  Melatonin 5 MG CAPS Take 5 mg by mouth at bedtime.    [provider]  midodrine  (PROAMATINE ) 10 MG tablet TAKE 2 TABLETS (20 MG TOTAL) BY MOUTH 4 (FOUR) TIMES DAILY. Patient taking differently: Take 20 mg by mouth 4 (four) times daily. Currently taking three times daily 09/03/23   Verona Goodwill, MD  Omega 3 1200 MG CAPS Take 1,200 mg by mouth daily. Patient not taking: Reported on 09/20/2023    [provider]  potassium  chloride (KLOR-CON  M) 10 MEQ tablet Take 2 tablets (20 mEq total) by mouth daily. 08/01/23   Verona Goodwill, MD  predniSONE  (DELTASONE ) 10 MG tablet 6 tab every morning x2 weeks,   5 tabs every morning x 2 weeks. 4 tabs every morning 07/08/23   Phebe Brasil, MD  Probiotic Product (ALIGN) 4 MG CAPS Take 4 mg by mouth at bedtime.     [provider]  pyridostigmine  (MESTINON ) 60 MG tablet Take 0.5 tablets (30 mg total) by mouth 2 (two) times daily. 03/19/23   Verona Goodwill, MD  rosuvastatin  (CRESTOR ) 10 MG tablet Take 1 tablet (10 mg total) by mouth daily. Patient taking differently: Take 10 mg by mouth at bedtime. 07/03/18   Johny Nap, NP  vitamin B-12 (CYANOCOBALAMIN ) 500 MCG tablet Take 500 mcg by mouth daily.    [provider]      Allergies    Anesthetics, amide; Clindamycin ; Shellfish allergy; Sulfonamide derivatives; Neomycin; Zolpidem  tartrate; Azithromycin ; Bactroban; Ciprofloxacin; Codeine; Meperidine hcl; Morphine ; Neosporin [neomycin-polymyxin-gramicidin]; Nitrofurantoin ; Oxycodone -acetaminophen ; Penicillins; Tramadol ; and Latex    Review of Systems   Review of Systems  Constitutional:  Negative for chills, fatigue and fever.  HENT:  Negative for congestion.   Eyes:  Negative for visual disturbance.  Respiratory:  Negative for cough, chest tightness, shortness of breath and wheezing.   Cardiovascular:  Negative for chest pain, palpitations and leg swelling.  Gastrointestinal:  Negative for abdominal pain, constipation, diarrhea, nausea and vomiting.  Genitourinary:  Negative for dysuria and flank pain.  Musculoskeletal:  Negative for back pain, neck pain and neck stiffness.  Skin:  Negative for rash and wound.  Neurological:  Positive for weakness and numbness. Negative for seizures, speech difficulty, light-headedness and headaches.  Psychiatric/Behavioral:  Negative for agitation and confusion.   All other systems reviewed and are negative.   Physical  Exam Updated Vital Signs BP (!) 147/78 (BP Location: Right Arm)   Pulse 76   Temp 97.9 F (36.6 C) (Oral)   Resp 18   Ht 5\' 7"  (1.702 m)   Wt 71.8 kg   SpO2 100%   BMI 24.79 kg/m  Physical Exam Vitals and nursing note reviewed.  Constitutional:      General: She is not in acute distress.    Appearance: She is well-developed. She is not ill-appearing, toxic-appearing or diaphoretic.  HENT:     Head: Normocephalic and atraumatic.     Right Ear: External ear normal.     Left Ear: External ear normal.     Nose: Nose normal. No congestion or rhinorrhea.     Mouth/Throat:     Mouth: Mucous membranes are moist.     Pharynx: No oropharyngeal exudate or posterior oropharyngeal erythema.  Eyes:     Conjunctiva/sclera: Conjunctivae normal.     Pupils: Pupils  are equal, round, and reactive to light.  Pulmonary:     Effort: No respiratory distress.     Breath sounds: No stridor.  Abdominal:     General: There is no distension.     Tenderness: There is no abdominal tenderness. There is no right CVA tenderness, left CVA tenderness, guarding or rebound.  Musculoskeletal:        General: No tenderness.     Cervical back: Normal range of motion and neck supple.     Right lower leg: No edema.     Left lower leg: No edema.  Skin:    General: Skin is warm.     Findings: No bruising, erythema or rash.  Neurological:     Mental Status: She is alert and oriented to person, place, and time.     Cranial Nerves: No dysarthria.     Sensory: Sensory deficit present.     Motor: Weakness present. No abnormal muscle tone.     Coordination: Coordination normal.     Deep Tendon Reflexes: Reflexes are normal and symmetric.     Comments: On initial evaluation patient did have some atypical weakness in right arm and right leg and numbness in right arm and right leg that resolved after she returned from imaging.  No focal neurologic deficits on my reassessment.  Psychiatric:        Mood and Affect: Mood  normal.     ED Results / Procedures / Treatments   Labs (all labs ordered are listed, but only abnormal results are displayed) Labs Reviewed  APTT - Abnormal; Notable for the following components:      Result Value   aPTT 23 (*)    All other components within normal limits  COMPREHENSIVE METABOLIC PANEL WITH GFR - Abnormal; Notable for the following components:   Glucose, Bld 130 (*)    AST 64 (*)    ALT 78 (*)    All other components within normal limits  I-STAT CHEM 8, ED - Abnormal; Notable for the following components:   Glucose, Bld 128 (*)    Calcium , Ion 1.11 (*)    All other components within normal limits  CBG MONITORING, ED - Abnormal; Notable for the following components:   Glucose-Capillary 120 (*)    All other components within normal limits  PROTIME-INR  CBC  DIFFERENTIAL  ETHANOL    EKG None  Radiology MR BRAIN WO CONTRAST Result Date: 10/08/2023 CLINICAL DATA:  Stroke follow up. EXAM: MRI HEAD WITHOUT CONTRAST TECHNIQUE: Multiplanar, multiecho pulse sequences of the brain and surrounding structures were obtained without intravenous contrast. COMPARISON:  CT head and CTA head and neck earlier same day. FINDINGS: Brain: No acute infarct. No evidence of intracranial hemorrhage. T2/FLAIR hyperintensity in the periventricular and subcortical white matter. Generalized parenchymal volume loss. Heterogeneous appearance of the basal ganglia and thalami with small remote lacunar infarcts noted. No edema, mass effect, or midline shift. Posterior fossa is unremarkable. Normal appearance of midline structures. The basilar cisterns are patent. No extra-axial fluid collections. Ventricles: Prominence of the lateral ventricles suggestive of underlying parenchymal volume loss. Vascular: Skull base flow voids are visualized. Skull and upper cervical spine: No focal abnormality. Sinuses/Orbits: Orbits are symmetric. Paranasal sinuses are clear. Other: Mastoid air cells are clear.  IMPRESSION: No acute intracranial abnormality. Moderate chronic microvascular ischemic changes. Generalized parenchymal volume loss. Small remote lacunar infarcts in the basal ganglia and thalami. Electronically Signed   By: Denny Flack M.D.   On: 10/08/2023 18:41  CT ANGIO HEAD NECK W WO CM (CODE STROKE) Result Date: 10/08/2023 CLINICAL DATA:  Neuro deficit, concern for stroke, right-sided weakness and facial droop. EXAM: CT ANGIOGRAPHY HEAD AND NECK WITH AND WITHOUT CONTRAST TECHNIQUE: Multidetector CT imaging of the head and neck was performed using the standard protocol during bolus administration of intravenous contrast. Multiplanar CT image reconstructions and MIPs were obtained to evaluate the vascular anatomy. Carotid stenosis measurements (when applicable) are obtained utilizing NASCET criteria, using the distal internal carotid diameter as the denominator. RADIATION DOSE REDUCTION: This exam was performed according to the departmental dose-optimization program which includes automated exposure control, adjustment of the mA and/or kV according to patient size and/or use of iterative reconstruction technique. CONTRAST:  75mL OMNIPAQUE  IOHEXOL  350 MG/ML SOLN COMPARISON:  Same-day head CT.  CTA head and neck 04/09/2023. FINDINGS: CTA NECK FINDINGS Aortic arch: Standard configuration of the aortic arch. Imaged portion shows no evidence of aneurysm or dissection. Mild atherosclerosis. No significant stenosis of the major arch vessel origins. Pulmonary arteries: As permitted by contrast timing, there are no filling defects in the visualized pulmonary arteries. Subclavian arteries: The subclavian arteries are patent bilaterally. Right carotid system: No evidence of dissection, stenosis (50% or greater), or occlusion. Mild tortuosity of the proximal common carotid artery. Tortuosity of the mid cervical ICA. Left carotid system: No evidence of dissection, stenosis (50% or greater), or occlusion. Vertebral  arteries: Codominant. No evidence of dissection, stenosis (50% or greater), or occlusion. Adjacent dense venous contrast slightly limits evaluation of the proximal V2 segments. Atherosclerosis of the bilateral V4 segments without significant stenosis. Skeleton: No acute findings. Degenerative changes in the cervical spine. Anterior cervical fusion at C5-C7. Other neck: The visualized airway is patent. No cervical lymphadenopathy. Upper chest: Biapical pleuroparenchymal scarring. Left-sided chest wall pacer device. Postsurgical changes in the right axilla. Review of the MIP images confirms the above findings CTA HEAD FINDINGS ANTERIOR CIRCULATION: The intracranial ICAs are patent bilaterally. Minimal atherosclerosis of the carotid siphons without significant stenosis. No significant stenosis, proximal occlusion, aneurysm, or vascular malformation. MCAs: The middle cerebral arteries are patent bilaterally. ACAs: The anterior cerebral arteries are patent bilaterally. POSTERIOR CIRCULATION: No significant stenosis, proximal occlusion, aneurysm, or vascular malformation. PCAs: The posterior cerebral arteries are patent bilaterally. Pcomm: Not well visualized. SCAs: The superior cerebellar arteries are patent bilaterally. Basilar artery: Patent AICAs: Patent PICAs: Patent Vertebral arteries: The intracranial vertebral arteries are patent. Venous sinuses: As permitted by contrast timing, patent. Anatomic variants: None Review of the MIP images confirms the above findings IMPRESSION: No large vessel occlusion. No high-grade stenosis, aneurysm, or dissection of the arteries in the head and neck. Mild atherosclerosis as above. Aortic Atherosclerosis (ICD10-I70.0). Electronically Signed   By: Denny Flack M.D.   On: 10/08/2023 17:02   CT HEAD CODE STROKE WO CONTRAST Result Date: 10/08/2023 EXAM: CT HEAD WITHOUT 10/08/2023 04:31:00 PM TECHNIQUE: CT of the head was performed without the administration of intravenous  contrast. Automated exposure control, iterative reconstruction, and/or weight based adjustment of the mA/kV was utilized to reduce the radiation dose to as low as reasonably achievable. COMPARISON: CT head without contrast 04/09/2023. CLINICAL HISTORY: Neuro deficit, acute, stroke suspected. Facial droop. Right sided weakness. FINDINGS: BRAIN AND VENTRICLES: There is no acute intracranial hemorrhage, mass effect or midline shift. No abnormal extra-axial fluid collection. The gray-white differentiation is maintained without evidence of an acute infarct. There is no evidence of hydrocephalus. Periventricular white matter hypoattenuation is similar to the prior exam. Ganglionic level: 7/7 Supraganglionic  level: 3/3 Total: 10/10 ORBITS: Bilateral lens replacements are noted. The globes and orbits are otherwise within normal limits. SINUSES: The visualized paranasal sinuses and mastoid air cells demonstrate no acute abnormality. SOFT TISSUES AND SKULL: No acute abnormality of the visualized skull or soft tissues. At the end of the report, the pertinent results were texted to Dr. last name via the amion system at 4:38 pm. Electronically signed by: Audree Leas MD 10/08/2023 04:40 PM EDT RP Workstation: [id number]    Procedures Procedures    Medications Ordered in ED Medications  iohexol  (OMNIPAQUE ) 350 MG/ML injection 75 mL (75 mLs Intravenous Contrast Given 10/08/23 1640)    ED Course/ Medical Decision Making/ A&P                                 Medical Decision Making Amount and/or Complexity of Data Reviewed Labs: ordered. Radiology: ordered.    SAMERA MACY is a 78 y.o. female with a past medical history significant for previous breast cancer, hypothyroidism, previous stroke, pacemaker, hyperlipidemia, diverticulosis, esophageal stricture, and recent DVT on Eliquis  who presents as a code stroke.  According to report, patient started having symptoms at about 3 PM with numbness and  weakness on the right side of her body in the face, arm, and leg.  Patient has had a history of complicated migraines but said this does not feel the same.  Patient quickly taken to CT scanner with neurology where she had CT imaging.  Neurology felt that MRI would be helpful so due to time constraints she will get MRI immediately after getting the CT scans.  I will do further assessment when she gets back from MRI.  4:45 PM Neurology was able to arrange her to get MRI this afternoon despite having a device.  Plan will be to see what the MRI shows and either headache cocktail and reassess if it is negative versus needing admission if there is a stroke.  Neurology recommended giving her a headache cocktail and reassess to see if symptoms have improved.  If they had resolved and she was doing well they agreed with plan for discharge home.  7:35 PM Patient returned from MRI and MRI shows no acute stroke.  Patient symptoms have resolved with no further tingling and numbness or weakness on the right side of her body.  Her blood pressure is in the 180s now down from over 200 like it was earlier.  Patient reports she was post take midodrine  earlier today however with her blood pressure being high she will hold that.   We will observe her and let her eat and drink something to make sure she continues to be improved and anticipate discharge if workup still reassuring and symptoms have resolved.  9:35 PM Patient's MRI did not show stroke.  On reassessment she continues to have no focal neurologic deficits and her blood pressure has returned to the 160s systolic.  This is how they have been for the last several weeks she reports.  She will call her neurology team and PCP to discuss these neurologic symptoms and her blood pressure medication management.  Patient and family agree to plan for discharge and plan of care and had no other questions or concerns.  Patient discharged in good condition with resolve  symptoms.         Final Clinical Impression(s) / ED Diagnoses Final diagnoses:  Transient neurological symptoms  Elevated blood pressure reading  Rx / DC Orders ED Discharge Orders     None       Clinical Impression: 1. Transient neurological symptoms   2. Elevated blood pressure reading     Disposition: Discharge  Condition: Good  I have discussed the results, Dx and Tx plan with the pt(& family if present). He/she/they expressed understanding and agree(s) with the plan. Discharge instructions discussed at great length. Strict return precautions discussed and pt &/or family have verbalized understanding of the instructions. No further questions at time of discharge.    New Prescriptions   No medications on file    Follow Up: Suan Elm, MD [address] [place] [address] [phone]     your neurologist         Tywana Robotham, Marine Sia, MD 10/08/23 2141

## 2023-10-08 NOTE — Discharge Instructions (Signed)
 Your history, exam, workup today did not show evidence of acute stroke or bleeding.  Your labs overall similar to prior as we discussed.  Your blood pressure was quite elevated and improved since you have been here.  You showed me your blood pressure readings over the last few weeks and it seemed consistent with where your blood pressure returned.  Please call and follow-up with your primary doctor and your neurology team.  The neurology team felt your symptoms were likely related to a complex migraine without the headache component versus related to blood pressure.  Please rest and stay hydrated.  If any symptoms change or worsen acutely, please return to the nearest emergency department.

## 2023-10-08 NOTE — ED Triage Notes (Signed)
 PT was BIB [facility] from physician office with a c/o a possible code stroke. Pts LKW was at 1500 when she started presenting with right arm and leg weakness and right sided facial droop.PTS BP was a systolic of 200's  and CBG of 469.PT has good, gross motor function.PT last took her eliquis  last nightPT is a+ox4.

## 2023-10-08 NOTE — Code Documentation (Addendum)
 Stroke Response Nurse Documentation Code Documentation  [name] is a 78 y.o. female arriving to [facility]  via [facility] on 10/08/2023 with past medical hx of DVT, orthstatic hypotension, breast cancer, prior stroke and migraines.. On Eliquis  (apixaban ) daily. Code stroke was activated by EMS.   Patient from a therapy session where she was LKW at 1500 and now complaining of rt droop, rt sided weakness and sensory loss.   Stroke team at the bedside on patient arrival. Labs drawn and patient cleared for CT by Dr. Manus Sellers. Patient to CT with team. NIHSS 4, see documentation for details and code stroke times. Patient with right facial droop, right arm weakness, right leg weakness, and right decreased sensation on exam. The following imaging was completed:  CT Head, CTA, and MRI. Patient is not a candidate for IV Thrombolytic due to Eliquis . Patient is not a candidate for IR due to imaging negative for LVO.   Care Plan: NIHSS and VS q 2 for 12, then q 4.                   Stroke swallow screen before POs.     Bedside handoff with ED RN Oakley Bellman complete.    Tomica Arseneault Livengood  Stroke Response RN

## 2023-10-08 NOTE — Progress Notes (Signed)
  Device system confirmed to be MRI conditional, with implant date > 6 weeks ago, and no evidence of abandoned or epicardial leads in review of most recent CXR  Device last cleared by EP Provider: Creighton Doffing  Clearance is good through for 1 year as long as parameters remain stable at time of check. If pt undergoes a cardiac device procedure during that time, they should be re-cleared.   Tachy-therapies to be programmed off if applicable with device back to pre-MRI settings after completion of exam.  [employer] - Programming recommendation received through [employer] App/Tablet  [place] E Maryela Tapper, RT  10/08/2023 5:27 PM

## 2023-10-09 ENCOUNTER — Encounter (INDEPENDENT_AMBULATORY_CARE_PROVIDER_SITE_OTHER): Payer: Self-pay | Admitting: Neurology

## 2023-10-09 DIAGNOSIS — G9089 Other disorders of autonomic nervous system: Secondary | ICD-10-CM

## 2023-10-09 DIAGNOSIS — R634 Abnormal weight loss: Secondary | ICD-10-CM

## 2023-10-09 NOTE — Progress Notes (Signed)
 Remote pacemaker transmission.

## 2023-10-09 NOTE — Addendum Note (Signed)
 Addended by: Lott Rouleau A on: 10/09/2023 03:29 PM   Modules accepted: Orders

## 2023-10-10 NOTE — Telephone Encounter (Signed)
 She is now taking   Mestinon  30mg  bid Florinef  0.1mg  8am.  Droxidopa  100mg   6:30am, 930am, 1400 Midodrine  10mg  at 730am, 11:30, 1700  She is function ok at home, but has episodes of significantly elevated bp up to 210/140, even at sitting up position, was treated at emergency room on Oct 08, 2023  MRI of brain on May 27th 2025: No acute abnormality, moderate small vessel disease   I also reviewed home blood pressure record, continue to have significant blood pressure evaluation, orthostatic hypotension, mixed with elevated blood pressure even in sitting up, standing position up to 160/100  1.,  Keep current dose of Mestinon  30mg  bid Florinef  0.1mg  8am.  Droxidopa  100mg   6:30am, 930am, 1400 Change Midodrine  from 10 to 5 mg at 730am, 11:30, 1600  2.  Orders Placed This Encounter  Procedures   CT ABDOMEN PELVIS W WO CONTRAST     To rule out adrenal gland tumor with her significant blood pressure evaluation with elevated blood pressure  Please see the MyChart message reply(ies) for my assessment and plan.    This patient gave consent for this Medical Advice Message and is aware that it may result in a bill to [employer], as well as the possibility of receiving a bill for a co-payment or deductible. They are an established patient, but are not seeking medical advice exclusively about a problem treated during an in person or video visit in the last seven days. I did not recommend an in person or video visit within seven days of my reply.    I spent a total of 15 minutes cumulative time within 7 days through [employer].  Phebe Brasil, MD

## 2023-10-11 ENCOUNTER — Telehealth: Payer: Self-pay | Admitting: Neurology

## 2023-10-11 DIAGNOSIS — G9089 Other disorders of autonomic nervous system: Secondary | ICD-10-CM

## 2023-10-11 DIAGNOSIS — R634 Abnormal weight loss: Secondary | ICD-10-CM

## 2023-10-11 NOTE — Telephone Encounter (Signed)
 no auth required sent to GI [phone]

## 2023-10-14 DIAGNOSIS — G909 Disorder of the autonomic nervous system, unspecified: Secondary | ICD-10-CM | POA: Diagnosis not present

## 2023-10-14 DIAGNOSIS — R040 Epistaxis: Secondary | ICD-10-CM | POA: Diagnosis not present

## 2023-10-14 DIAGNOSIS — R1310 Dysphagia, unspecified: Secondary | ICD-10-CM | POA: Diagnosis not present

## 2023-10-14 DIAGNOSIS — I480 Paroxysmal atrial fibrillation: Secondary | ICD-10-CM | POA: Diagnosis not present

## 2023-10-14 DIAGNOSIS — R0981 Nasal congestion: Secondary | ICD-10-CM | POA: Diagnosis not present

## 2023-10-14 DIAGNOSIS — J339 Nasal polyp, unspecified: Secondary | ICD-10-CM | POA: Diagnosis not present

## 2023-10-14 NOTE — Addendum Note (Signed)
 Addended by: Krystelle Prashad on: 10/14/2023 05:19 PM   Modules accepted: Orders

## 2023-10-14 NOTE — Telephone Encounter (Signed)
 Orders Placed This Encounter  Procedures   CT ENTERO ABD/PELVIS W CONTAST

## 2023-10-14 NOTE — Telephone Encounter (Signed)
 This is from GI for the CT scan: order needs to state CT ABD PEL W due to diagnosis

## 2023-10-17 NOTE — Telephone Encounter (Signed)
 GI left her a voice mail to call them back to get scheduled.

## 2023-10-18 ENCOUNTER — Encounter: Payer: Self-pay | Admitting: Neurology

## 2023-10-18 ENCOUNTER — Encounter (INDEPENDENT_AMBULATORY_CARE_PROVIDER_SITE_OTHER): Payer: Self-pay | Admitting: Neurology

## 2023-10-18 DIAGNOSIS — G9089 Other disorders of autonomic nervous system: Secondary | ICD-10-CM | POA: Diagnosis not present

## 2023-10-18 DIAGNOSIS — R634 Abnormal weight loss: Secondary | ICD-10-CM

## 2023-10-18 DIAGNOSIS — R269 Unspecified abnormalities of gait and mobility: Secondary | ICD-10-CM

## 2023-10-19 ENCOUNTER — Encounter: Payer: Self-pay | Admitting: Neurology

## 2023-10-21 ENCOUNTER — Other Ambulatory Visit: Payer: Self-pay | Admitting: Student-PharmD

## 2023-10-21 ENCOUNTER — Telehealth: Payer: Self-pay | Admitting: Neurology

## 2023-10-21 ENCOUNTER — Ambulatory Visit
Admission: RE | Admit: 2023-10-21 | Discharge: 2023-10-21 | Disposition: A | Discharge status: Home / Self Care | Source: Ambulatory Visit | Attending: Neurology | Admitting: Neurology

## 2023-10-21 DIAGNOSIS — N319 Neuromuscular dysfunction of bladder, unspecified: Secondary | ICD-10-CM | POA: Diagnosis not present

## 2023-10-21 DIAGNOSIS — R634 Abnormal weight loss: Secondary | ICD-10-CM | POA: Diagnosis not present

## 2023-10-21 DIAGNOSIS — G9089 Other disorders of autonomic nervous system: Secondary | ICD-10-CM

## 2023-10-21 MED ORDER — IOPAMIDOL (ISOVUE-300) INJECTION 61%
100.0000 mL | Freq: Once | INTRAVENOUS | Status: AC | PRN
  Administered 2023-10-21: 100 mL via INTRAVENOUS

## 2023-10-21 NOTE — Telephone Encounter (Signed)
 I have put in referral to [facility], and [facility] general neurology clinic   Orders Placed This Encounter  Procedures   Ambulatory referral to Neurology   Ambulatory referral to Neurology       [email or web address] [phone].   [facility] neurology clinic for autonomic failure.   Call the office at [phone].  Please see the MyChart message reply(ies) for my assessment and plan.    This patient gave consent for this Medical Advice Message and is aware that it may result in a bill to [employer], as well as the possibility of receiving a bill for a co-payment or deductible. They are an established patient, but are not seeking medical advice exclusively about a problem treated during an in person or video visit in the last seven days. I did not recommend an in person or video visit within seven days of my reply.    I spent a total of 10 minutes cumulative time within 7 days through [employer].  Phebe Brasil, MD

## 2023-10-21 NOTE — Telephone Encounter (Signed)
 Referral for neurology fax to [facility]. Phone: [phone], Fax: [phone]

## 2023-10-21 NOTE — Telephone Encounter (Addendum)
 Pt has called for April,RN she wants [facility] to be able to fax the order so the lab work needed can be done here.  The fax # was provided to pt, she will provide to [facility]

## 2023-10-21 NOTE — Telephone Encounter (Signed)
 Referral for neurology fax to [facility]. Phone: [phone], Fax: [phone]

## 2023-10-22 ENCOUNTER — Ambulatory Visit: Payer: Self-pay | Admitting: Neurology

## 2023-10-22 ENCOUNTER — Telehealth: Payer: Self-pay

## 2023-10-22 DIAGNOSIS — R040 Epistaxis: Secondary | ICD-10-CM | POA: Diagnosis not present

## 2023-10-22 NOTE — Telephone Encounter (Signed)
 Mychart message sent.

## 2023-10-23 ENCOUNTER — Telehealth: Payer: Self-pay

## 2023-10-23 NOTE — Telephone Encounter (Signed)
 Patients husband came into office to drop off referral info for [facility], would like referral to [facility] too. Dr. Gracie Lav in agreement for second referral if unable/delays to get into [facility]. Lab forms given to husband that were faxed from [facility]

## 2023-10-23 NOTE — Telephone Encounter (Signed)
 Nurse received paperwork from family member with referral process from [facility]. Reviewed the instruction on fax number.  Refax referral for neurology to [facility]. Fax: [phone]

## 2023-10-25 ENCOUNTER — Inpatient Hospital Stay (HOSPITAL_BASED_OUTPATIENT_CLINIC_OR_DEPARTMENT_OTHER): Admitting: Hematology and Oncology

## 2023-10-25 ENCOUNTER — Inpatient Hospital Stay: Attending: Hematology and Oncology

## 2023-10-25 VITALS — BP 156/85 | HR 78 | Temp 97.8°F | Resp 15 | Wt 159.5 lb

## 2023-10-25 DIAGNOSIS — I82542 Chronic embolism and thrombosis of left tibial vein: Secondary | ICD-10-CM

## 2023-10-25 DIAGNOSIS — Z853 Personal history of malignant neoplasm of breast: Secondary | ICD-10-CM | POA: Diagnosis not present

## 2023-10-25 DIAGNOSIS — Z7901 Long term (current) use of anticoagulants: Secondary | ICD-10-CM | POA: Diagnosis not present

## 2023-10-25 DIAGNOSIS — I82442 Acute embolism and thrombosis of left tibial vein: Secondary | ICD-10-CM | POA: Insufficient documentation

## 2023-10-25 DIAGNOSIS — I82452 Acute embolism and thrombosis of left peroneal vein: Secondary | ICD-10-CM | POA: Diagnosis not present

## 2023-10-25 DIAGNOSIS — Z79899 Other long term (current) drug therapy: Secondary | ICD-10-CM | POA: Insufficient documentation

## 2023-10-25 DIAGNOSIS — K52831 Collagenous colitis: Secondary | ICD-10-CM | POA: Diagnosis not present

## 2023-10-25 LAB — CBC WITH DIFFERENTIAL (CANCER CENTER ONLY)
Abs Immature Granulocytes: 0.04 10*3/uL (ref 0.00–0.07)
Basophils Absolute: 0 10*3/uL (ref 0.0–0.1)
Basophils Relative: 0 %
Eosinophils Absolute: 0 10*3/uL (ref 0.0–0.5)
Eosinophils Relative: 0 %
HCT: 37.3 % (ref 36.0–46.0)
Hemoglobin: 12.3 g/dL (ref 12.0–15.0)
Immature Granulocytes: 0 %
Lymphocytes Relative: 19 %
Lymphs Abs: 1.8 10*3/uL (ref 0.7–4.0)
MCH: 31.8 pg (ref 26.0–34.0)
MCHC: 33 g/dL (ref 30.0–36.0)
MCV: 96.4 fL (ref 80.0–100.0)
Monocytes Absolute: 0.5 10*3/uL (ref 0.1–1.0)
Monocytes Relative: 5 %
Neutro Abs: 7.3 10*3/uL (ref 1.7–7.7)
Neutrophils Relative %: 76 %
Platelet Count: 239 10*3/uL (ref 150–400)
RBC: 3.87 MIL/uL (ref 3.87–5.11)
RDW: 15.2 % (ref 11.5–15.5)
WBC Count: 9.6 10*3/uL (ref 4.0–10.5)
nRBC: 0 % (ref 0.0–0.2)

## 2023-10-25 LAB — CMP (CANCER CENTER ONLY)
ALT: 43 U/L (ref 0–44)
AST: 40 U/L (ref 15–41)
Albumin: 4.4 g/dL (ref 3.5–5.0)
Alkaline Phosphatase: 46 U/L (ref 38–126)
Anion gap: 7 (ref 5–15)
BUN: 16 mg/dL (ref 8–23)
CO2: 27 mmol/L (ref 22–32)
Calcium: 9.9 mg/dL (ref 8.9–10.3)
Chloride: 105 mmol/L (ref 98–111)
Creatinine: 0.98 mg/dL (ref 0.44–1.00)
GFR, Estimated: 59 mL/min — ABNORMAL LOW (ref >60)
Glucose, Bld: 117 mg/dL — ABNORMAL HIGH (ref 70–99)
Potassium: 4.4 mmol/L (ref 3.5–5.1)
Sodium: 139 mmol/L (ref 135–145)
Total Bilirubin: 0.8 mg/dL (ref 0.0–1.2)
Total Protein: 7.3 g/dL (ref 6.5–8.1)

## 2023-10-25 NOTE — Progress Notes (Unsigned)
 [facility] [phone]   [phone]  INITIAL CONSULT NOTE  Patient Care Team: Suan Elm, MD as PCP - General (Internal Medicine) Verona Goodwill, MD as PCP - Electrophysiology (Cardiology) Daphine Eagle, MD as Consulting Physician (Psychiatry)  Hematological/Oncological History # Age Indeterminate LLE DVT  09/20/2023: US  of LLE showed findings consistent with age indeterminate deep vein thrombosis  involving the left posterior tibial veins, and left peroneal veins   CHIEF COMPLAINTS/PURPOSE OF CONSULTATION:  Age Indeterminate LLE DVT    HISTORY OF PRESENTING ILLNESS:  [name] 78 y.o. female with medical history significant for right sided breast cancer, asthma, ADHD, autonomic dysfunction, CVA, hemorrhoids, and thyroid  disease who presents for evaluation of a left lower extremity DVT.  On review of the previous records Mr. Glassner underwent a ultrasound of her lower extremities due to concerning findings in her blood work.  At that time the ultrasound showed findings consistent with age indeterminate deep vein thrombosis involving the left posterior tibial veins, and left peroneal veins.  The patient was started on Eliquis  therapy.  Due to concern for this finding the patient was referred to hematology for further evaluation management.  On exam today Mrs. Conley reports she was concerned that there may be problems with her platelets, but she does often confuse her medical history with the medical history of her daughter who recently passed away.  She reports that she has been having swelling of her lower extremity but no pain.  She has had no serious other health issues recently such as surgeries, immobility, or viral illness.  She reports that this is the first clot that she has had.  She is having shortness of breath but no recent chest pain.  She notes that she is tolerating Eliquis  well with some bleeding of her gums when she  brushes but otherwise no blood in the urine, blood in the stool, or bleeding elsewhere.  She reports that she does have issues with complex migraine and has been having numerous issues with autonomic dysfunction.  On further discussion she reports that her mother died age 75 due to metastatic colon cancer.  Her father had a heart attack.  She reports her daughter died of sepsis/pneumonia.  She reports she is a never smoker and does not currently drink alcohol .  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  She otherwise denies any fevers, chills, sweats, nausea, vomiting or diarrhea.  A full 10 point ROS is otherwise negative.  MEDICAL HISTORY:  Past Medical History:  Diagnosis Date   ADHD (attention deficit hyperactivity disorder)    Allergy    trees/pollen, mold, fungus, dust mites. Takes allergy shots   Arthritis    PAIN AND OA LEFT HIP   Asthma    allergist Dr Kozlo- monthly allergy injections   Autonomic dysfunction    CENTRAL NERVOUS SYSTEM NEUROPATHY - DX BY DR. Dania Dupre MORE THAN 10 YRS AGO - AND IT IS FELT TO CONTRIBUTE TO THE AUTOMIC  DYSFUNCTION-- PT HAS NUMBNESS LEGS AND FEET AND SOMETIIMES TIPS OF FINGER, SEVERE CONSTIPATION( NO SENSATION TO HAVE BM ), DOUBLE VISION, ORTHOSTATIC HYPOTENSION   Blood transfusion    Breast cancer (HCC)    right side   Bruises easily    Cataract    Chest pain    a. 12/2012 Cath: nl cors, EF 55-65%.   Complication of anesthesia    reaction to some anesthetics/ 7/12 anesth record on chart- states prefers  epidural   CVA (cerebrovascular accident) (HCC) 03/21/2018   Depression    Diverticulosis    Dysrhythmia    HX OF HIGH GRADE HEART BLOCK - REQUIRED PACEMAKER INSERTION   Esophageal stricture    Gastritis    GERD (gastroesophageal reflux disease)    H/O hiatal hernia    Hemorrhoids    Hernia of abdominal wall    spigelian hernia RLQ - SURGERY TO REPAIR   Hypothyroidism    Interstitial cystitis    Latex allergy, contact  dermatitis    Mallory - Weiss tear    HEALED    Neuromuscular disorder (HCC)    central nervous system neuropathy- seen per Dr Dania Dupre   Pacemaker    Pericardial effusion    a. 12/2012 following ppm placement;  b. 01/01/2013 Echo: EF 55-60%, small pericardial effusion w/o RV collapse-->No need for tap/window.   PONV (postoperative nausea and vomiting)    pt needs scop patch   Recurrent upper respiratory infection (URI) 1/13- to present   bronchitis following surgery- states improved but still with cough. OV with [facility] Dr Delorise Few 09/06/11 on chart   Shortness of breath    AT TIMES - BUT MUCH IMPROVED AFTER PACEMAKER WAS REPROGRAMED.   Symptomatic bradycardia    a. 12/2012 s/p MDT dual chamber PPM, ser # [id number]; b. 12/2012 post-op course complicated by pericardial effusinon req lead revision.   Thyroid  disease     SURGICAL HISTORY: Past Surgical History:  Procedure Laterality Date   ABDOMINAL HYSTERECTOMY  1974   BACK SURGERY     cervical fusion 4-5 with plate   BLADDER SUSPENSION     BREAST BIOPSY  2002   NO BLOOD PRESSURES ON RIGHT SIDE/   s/p  axillary node dissection   CYSTO WITH HYDRODISTENSION  09/07/2011   Procedure: CYSTOSCOPY/HYDRODISTENSION;  Surgeon: Edmund Gouge, MD;  Location: [facility];  Service: Urology;  Laterality: N/A;  INSTILLATION OF MARCAINE /PYRIDIUM  INSTILLATION OF MARCAINE /KENALOG    CYSTOSCOPY  1975, 2007   EYE SURGERY     LASIK EYE SURGERY BILATERAL   LAPAROSCOPY  1973   LEAD REVISION N/A 12/30/2012   Procedure: LEAD REVISION;  Surgeon: Tammie Fall, MD;  Location: [facility] CATH LAB;  Service: Cardiovascular;  Laterality: N/A;   LEFT HEART CATHETERIZATION WITH CORONARY ANGIOGRAM N/A 12/29/2012   Procedure: LEFT HEART CATHETERIZATION WITH CORONARY ANGIOGRAM;  Surgeon: Peter M Swaziland, MD;  Location: [facility] CATH LAB;  Service: Cardiovascular;  Laterality: N/A;   MASTECTOMY MODIFIED RADICAL     right; with immediate reconstruction   MYRINGOPLASTY  1962   NECK  SURGERY     c4-5 ruptured disc   OOPHORECTOMY  1982   PACEMAKER INSERTION     PERMANENT PACEMAKER INSERTION N/A 12/29/2012   Procedure: PERMANENT PACEMAKER INSERTION;  Surgeon: Verona Goodwill, MD;  Location: [facility] CATH LAB;  Service: Cardiovascular;  Laterality: N/A;   PPM GENERATOR CHANGEOUT N/A 02/27/2023   Procedure: PPM GENERATOR CHANGEOUT;  Surgeon: Verona Goodwill, MD;  Location: [facility] INVASIVE CV LAB;  Service: Cardiovascular;  Laterality: N/A;   RECTOCELE REPAIR     with cystocele repair   TONSILLECTOMY     TOTAL HIP ARTHROPLASTY Right    TOTAL HIP ARTHROPLASTY Left 06/12/2013   Procedure: LEFT TOTAL HIP ARTHROPLASTY ANTERIOR APPROACH;  Surgeon: Arnie Lao, MD;  Location: [facility];  Service: Orthopedics;  Laterality: Left;   TYMPANOPLASTY  1973   right   ULNAR NERVE TRANSPOSITION Right    VENTRAL HERNIA REPAIR  05/30/2011   Procedure: HERNIA REPAIR VENTRAL ADULT;  Surgeon: Darcella Earnest, MD;  Location: [facility];  Service: General;  Laterality: Right;  repair right spigelian hernia    SOCIAL HISTORY: Social History   Socioeconomic History   Marital status: Married    Spouse name: Not on file   Number of children: 2   Years of education: 15   Highest education level: Not on file  Occupational History   Occupation: Retired    Associate Professor: [employer]    Comment: vp corporate affairs [employer]    Employer: [employer]  Tobacco Use   Smoking status: Never   Smokeless tobacco: Never  Vaping Use   Vaping status: Never Used  Substance and Sexual Activity   Alcohol  use: Yes    Alcohol /week: 7.0 standard drinks of alcohol     Types: 7 Glasses of wine per week    Comment: occasional   Drug use: No   Sexual activity: Not on file  Other Topics Concern   Not on file  Social History Narrative   Lives at home w/ her husband   Patient drinks 1-2 cup of caffeine daily.   Patient is right handed.   Social Drivers of Manufacturing engineer Strain: Not on file  Food Insecurity: No Food Insecurity (10/25/2023)   Hunger Vital Sign    Worried About Running Out of Food in the Last Year: Never true    Ran Out of Food in the Last Year: Never true  Transportation Needs: No Transportation Needs (10/25/2023)   PRAPARE - Administrator, Civil Service (Medical): No    Lack of Transportation (Non-Medical): No  Physical Activity: Not on file  Stress: Not on file  Social Connections: Unknown (09/24/2021)   Received from [facility]   Social Network    Social Network: Not on file  Intimate Partner Violence: Not At Risk (10/25/2023)   Humiliation, Afraid, Rape, and Kick questionnaire    Fear of Current or Ex-Partner: No    Emotionally Abused: No    Physically Abused: No    Sexually Abused: No    FAMILY HISTORY: Family History  Problem Relation Age of Onset   Heart disease Father    Colon cancer Mother 93   Depression Mother    Pancreatic cancer Sister 48   Diverticulitis Sister    Uterine cancer Maternal Grandmother    Heart disease Brother    Heart disease Paternal Grandmother    Heart disease Paternal Grandfather    Throat cancer Maternal Uncle    Heart disease Paternal Aunt    Heart disease Paternal Uncle    Heart disease Brother    Thrombosis Maternal Aunt    Cancer Maternal Uncle        NOS   Diabetes Other        aunt    ALLERGIES:  is allergic to anesthetics, amide; clindamycin ; shellfish allergy; sulfonamide derivatives; neomycin; zolpidem  tartrate; azithromycin ; bactroban; ciprofloxacin; codeine; meperidine hcl; morphine ; neosporin [neomycin-polymyxin-gramicidin]; nitrofurantoin ; oxycodone -acetaminophen ; penicillins; tramadol ; and latex.  MEDICATIONS:  Current Outpatient Medications  Medication Sig Dispense Refill   Bacitracin-Polymyxin B (POLYSPORIN) 500-10000 UNIT/GM OINT 1 application as needed Externally twice a day for 5 days     droxidopa  (NORTHERA ) 100 MG CAPS Take 3  capsules (300 mg total) by mouth 3 (three) times daily with meals. (Patient taking differently: Take 100 mg by mouth 3 (three) times daily with meals.) 270 capsule  11   Melatonin 5 MG CAPS Take 10 mg by mouth at bedtime.     midodrine  (PROAMATINE ) 10 MG tablet TAKE 2 TABLETS (20 MG TOTAL) BY MOUTH 4 (FOUR) TIMES DAILY. (Patient taking differently: Take 10 mg by mouth 3 (three) times daily. Currently taking three times daily) 720 tablet 3   predniSONE  (DELTASONE ) 10 MG tablet 6 tab every morning x2 weeks,   5 tabs every morning x 2 weeks. 4 tabs every morning (Patient taking differently: Take 10 mg by mouth daily with breakfast. 6 tab every morning x2 weeks,   5 tabs every morning x 2 weeks. 4 tabs every morning) 180 tablet 3   pyridostigmine  (MESTINON ) 60 MG tablet Take 0.5 tablets (30 mg total) by mouth 2 (two) times daily. (Patient taking differently: Take 30 mg by mouth daily.) 90 tablet 3   acetaminophen  (TYLENOL ) 500 MG tablet Take 1,000 mg by mouth 2 (two) times daily as needed for moderate pain.     apixaban  (ELIQUIS ) 5 MG TABS tablet Take 1 tablet (5 mg total) by mouth 2 (two) times daily. Start taking after completion of starter pack. 60 tablet 4   aspirin  EC 81 MG EC tablet Take 1 tablet (81 mg total) by mouth daily.     budesonide  (ENTOCORT EC ) 3 MG 24 hr capsule Take 3 mg by mouth daily. Every other day     Calcium  Carbonate-Vitamin D  (CALTRATE 600+D PO) Take 1 tablet by mouth daily.     cetirizine (ZYRTEC) 10 MG tablet Take 10 mg by mouth daily.     Cholecalciferol  (VITAMIN D ) 50 MCG (2000 UT) CAPS Take 2,000 Units by mouth daily.     cycloSPORINE (RESTASIS) 0.05 % ophthalmic emulsion Place 1 drop into both eyes 2 (two) times daily as needed (dry eyes).     diclofenac Sodium (VOLTAREN) 1 % GEL Apply 1 application  topically daily as needed (pain).     DULoxetine  (CYMBALTA ) 60 MG capsule Take 60 mg by mouth daily.     fludrocortisone  (FLORINEF ) 0.1 MG tablet Take 1 tablet (0.1 mg  total) by mouth daily. 270 tablet 3   folic acid (FOLVITE) 400 MCG tablet Take 400 mcg by mouth daily.     levothyroxine  (SYNTHROID ) 50 MCG tablet Take 50 mcg by mouth 3 (three) times a week. Take 50 mcg daily on Tuesday,Thursday, and Sunday     levothyroxine  (SYNTHROID ) 75 MCG tablet Take 75 mcg by mouth 4 (four) times a week. Pt takes on Monday,Wednesday,Friday, and Saturday     Magnesium  250 MG TABS Take 500 mg by mouth daily.     meclizine  (ANTIVERT ) 25 MG tablet Take 25 mg by mouth 3 (three) times daily as needed for dizziness.      Omega 3 1200 MG CAPS Take 1,200 mg by mouth daily. (Patient not taking: Reported on 09/20/2023)     potassium chloride  (KLOR-CON  M) 10 MEQ tablet Take 2 tablets (20 mEq total) by mouth daily. 180 tablet 3   Probiotic Product (ALIGN) 4 MG CAPS Take 4 mg by mouth at bedtime.      rosuvastatin  (CRESTOR ) 10 MG tablet Take 1 tablet (10 mg total) by mouth daily. (Patient taking differently: Take 10 mg by mouth at bedtime.) 90 tablet 0   vitamin B-12 (CYANOCOBALAMIN ) 500 MCG tablet Take 500 mcg by mouth daily.     No current facility-administered medications for this visit.   Facility-Administered Medications Ordered in Other Visits  Medication Dose Route Frequency Provider Last Rate  Last Admin   bupivacaine  (MARCAINE ) 0.5 % 10 mL, triamcinolone  acetonide (KENALOG -40) 40 mg injection   Subcutaneous Once Annamarie Kid, MD       bupivacaine  (MARCAINE ) 0.5 % 15 mL, phenazopyridine  (PYRIDIUM ) 400 mg bladder mixture   Bladder Instillation Once Annamarie Kid, MD        REVIEW OF SYSTEMS:   Constitutional: ( - ) fevers, ( - )  chills , ( - ) night sweats Eyes: ( - ) blurriness of vision, ( - ) double vision, ( - ) watery eyes Ears, nose, mouth, throat, and face: ( - ) mucositis, ( - ) sore throat Respiratory: ( - ) cough, ( - ) dyspnea, ( - ) wheezes Cardiovascular: ( - ) palpitation, ( - ) chest discomfort, ( - ) lower extremity swelling Gastrointestinal:  ( -  ) nausea, ( - ) heartburn, ( - ) change in bowel habits Skin: ( - ) abnormal skin rashes Lymphatics: ( - ) new lymphadenopathy, ( - ) easy bruising Neurological: ( - ) numbness, ( - ) tingling, ( - ) new weaknesses Behavioral/Psych: ( - ) mood change, ( - ) new changes  All other systems were reviewed with the patient and are negative.  PHYSICAL EXAMINATION:  Vitals:   10/25/23 1353  BP: (!) 156/85  Pulse: 78  Resp: 15  Temp: 97.8 F (36.6 C)  SpO2: 99%   Filed Weights   10/25/23 1353  Weight: 159 lb 8 oz (72.3 kg)    GENERAL: well appearing elderly Caucasian female in NAD  SKIN: skin color, texture, turgor are normal, no rashes or significant lesions EYES: conjunctiva are pink and non-injected, sclera clear LUNGS: clear to auscultation and percussion with normal breathing effort HEART: regular rate & rhythm and no murmurs and no lower extremity edema Musculoskeletal: no cyanosis of digits and no clubbing  PSYCH: alert & oriented x 3, fluent speech NEURO: no focal motor/sensory deficits  LABORATORY DATA:  I have reviewed the data as listed    Latest Ref Rng & Units 10/25/2023    3:24 PM 10/08/2023    4:29 PM 10/08/2023    4:24 PM  CBC  WBC 4.0 - 10.5 K/uL 9.6   9.0   Hemoglobin 12.0 - 15.0 g/dL 82.9  56.2  13.0   Hematocrit 36.0 - 46.0 % 37.3  38.0  39.3   Platelets 150 - 400 K/uL 239   251        Latest Ref Rng & Units 10/25/2023    3:24 PM 10/08/2023    4:29 PM 10/08/2023    4:24 PM  CMP  Glucose 70 - 99 mg/dL 865  784  696   BUN 8 - 23 mg/dL 16  16  14    Creatinine 0.44 - 1.00 mg/dL 2.95  2.84  1.32   Sodium 135 - 145 mmol/L 139  140  138   Potassium 3.5 - 5.1 mmol/L 4.4  3.8  3.9   Chloride 98 - 111 mmol/L 105  104  103   CO2 22 - 32 mmol/L 27   22   Calcium  8.9 - 10.3 mg/dL 9.9   9.2   Total Protein 6.5 - 8.1 g/dL 7.3   7.1   Total Bilirubin 0.0 - 1.2 mg/dL 0.8   0.9   Alkaline Phos 38 - 126 U/L 46   54   AST 15 - 41 U/L 40   64   ALT 0 - 44 U/L 43   78  ASSESSMENT & PLAN MAGDALEN CABANA 78 y.o. female with medical history significant for right sided breast cancer, asthma, ADHD, autonomic dysfunction, CVA, hemorrhoids, and thyroid  disease who presents for evaluation of a left lower extremity DVT.  After review of the labs, review of the records, and discussion with the patient the patients findings are most consistent with an unprovoked DVT.  A provoked venous thromboembolism (VTE) is one that has a clear inciting factor or event. Provoking factors include prolonged travel/immobility, surgery (particular abdominal or orthropedic), trauma,  and pregnancy/ estrogen containing birth control. After a detailed history and review of the records there is no clear provoking factor for this patient's VTE.  Patients with unprovoked VTEs have up to 25% recurrence after 5 years and 36% at 10 years, with 4% of these clots being fatal (BMJ?2019;366:l4363). Therefore the formal recommendation for unprovoked VTE's is lifelong anticoagulation, as the cause may not be transient or reversible. We recommend 6 months or full strength anticoagulation with a re-evaluation after that time.  The patient's will then have a choice of maintenance dose DOAC (preferred, recommended), 81mg  ASA PO daily (non-preferred), or no further anticoagulation (not recommended).    #Unprovoked DVT/Pulmonary Embolism  --findings at this time are consistent with a unprovoked VTE  --will order baseline CMP and CBC to assure labs are adequate for DOAC therapy  --rule out APS with anticardiolipin and anti beta2 glycoprotein antibodies.  Lupus anticoagulant panel would be altered by presence of blood thinner, will hold on this testing.   --recommend the patient continue eliquis  5mg  BID PO  --patient denies any bleeding, bruising, or dark stools on this medication. It is well tolerated. No difficulties accessing/affording the medication  --RTC in 6 months' time with strict return  precautions for overt signs of bleeding.     Orders Placed This Encounter  Procedures   CBC with Differential (Cancer Center Only)    Standing Status:   Future    Number of Occurrences:   1    Expiration Date:   10/24/2024   CMP (Cancer Center only)    Standing Status:   Future    Number of Occurrences:   1    Expiration Date:   10/24/2024   Cardiolipin antibodies, IgG, IgM, IgA*    Standing Status:   Future    Number of Occurrences:   1    Expiration Date:   10/24/2024   Beta-2-glycoprotein i abs, IgG/M/A    Standing Status:   Future    Number of Occurrences:   1    Expiration Date:   10/24/2024    All questions were answered. The patient knows to call the clinic with any problems, questions or concerns.  A total of more than 60 minutes were spent on this encounter with face-to-face time and non-face-to-face time, including preparing to see the patient, ordering tests and/or medications, counseling the patient and coordination of care as outlined above.   Rogerio Clay, MD Department of Hematology/Oncology [facility] at [facility] Phone: [phone] Pager: [phone] Email: [email or web address]  10/27/2023 3:37 PM

## 2023-10-26 LAB — CARDIOLIPIN ANTIBODIES, IGG, IGM, IGA
Anticardiolipin IgA: <9 U/mL (ref 0–11)
Anticardiolipin IgG: <9 GPL U/mL (ref 0–14)
Anticardiolipin IgM: <9 [MPL'U]/mL (ref 0–12)

## 2023-10-26 LAB — BETA-2-GLYCOPROTEIN I ABS, IGG/M/A
Beta-2 Glyco I IgG: <9 GPI IgG units (ref 0–20)
Beta-2-Glycoprotein I IgA: <9 GPI IgA units (ref 0–25)
Beta-2-Glycoprotein I IgM: <9 GPI IgM units (ref 0–32)

## 2023-10-28 ENCOUNTER — Other Ambulatory Visit (HOSPITAL_COMMUNITY): Payer: Self-pay

## 2023-10-30 ENCOUNTER — Ambulatory Visit: Payer: Self-pay | Admitting: *Deleted

## 2023-10-30 ENCOUNTER — Encounter (INDEPENDENT_AMBULATORY_CARE_PROVIDER_SITE_OTHER): Payer: Self-pay | Admitting: Neurology

## 2023-10-30 DIAGNOSIS — M7989 Other specified soft tissue disorders: Secondary | ICD-10-CM

## 2023-10-30 DIAGNOSIS — G9089 Other disorders of autonomic nervous system: Secondary | ICD-10-CM

## 2023-10-30 DIAGNOSIS — H40019 Open angle with borderline findings, low risk, unspecified eye: Secondary | ICD-10-CM | POA: Diagnosis not present

## 2023-10-30 NOTE — Telephone Encounter (Signed)
 TCT patient regarding recent lab results. No answer but able to leave detailed message on her identified phone vm. Advised that her blood work did not show any signs concerning for an autoimmune disease causing her blood clot.  There is no evidence of antiphospholipid antibody syndrome.  She  is okay to continue Eliquis  5 mg twice daily.  Will plan to see her back in approximately 5 months time. Advised that a scheduler will be calling her for that appt.

## 2023-10-30 NOTE — Telephone Encounter (Signed)
-----   Message from Rogerio Clay IV sent at 10/27/2023  3:37 PM EDT ----- Please let the patient know that her blood work did not show any signs concerning for an autoimmune disease causing her blood clot.  There is no evidence of antiphospholipid antibody syndrome.  She  is okay to continue Eliquis  5 mg twice daily.  Will plan to see her back in approximately 5 months time. ----- Message ----- From: Dannis Dy, Lab In [place] Sent: 10/25/2023   3:40 PM EDT To: Ander Bame, MD

## 2023-10-31 NOTE — Telephone Encounter (Signed)
Please see the MyChart message reply(ies) for my assessment and plan.    This patient gave consent for this Medical Advice Message and is aware that it may result in a bill to [employer], as well as the possibility of receiving a bill for a co-payment or deductible. They are an established patient, but are not seeking medical advice exclusively about a problem treated during an in person or video visit in the last seven days. I did not recommend an in person or video visit within seven days of my reply.    I spent a total of 10 minutes cumulative time within 7 days through [employer].  Marcial Pacas, MD

## 2023-10-31 NOTE — Telephone Encounter (Signed)
 Received Fax from  [facility]  stating  We regert to inform you that our department dose not have a provider specialized in POTS, dysautonomia, or autonomic dysfunction . We were unable to scheule an appt for specific diagnoses.

## 2023-11-04 NOTE — Addendum Note (Signed)
 Addended by: Makynzi Eastland on: 11/04/2023 11:52 AM   Modules accepted: Orders

## 2023-11-05 ENCOUNTER — Encounter (INDEPENDENT_AMBULATORY_CARE_PROVIDER_SITE_OTHER): Payer: Self-pay | Admitting: Neurology

## 2023-11-05 DIAGNOSIS — G9089 Other disorders of autonomic nervous system: Secondary | ICD-10-CM | POA: Diagnosis not present

## 2023-11-05 DIAGNOSIS — R634 Abnormal weight loss: Secondary | ICD-10-CM

## 2023-11-06 NOTE — Telephone Encounter (Signed)
Please see the MyChart message reply(ies) for my assessment and plan.    This patient gave consent for this Medical Advice Message and is aware that it may result in a bill to [employer], as well as the possibility of receiving a bill for a co-payment or deductible. They are an established patient, but are not seeking medical advice exclusively about a problem treated during an in person or video visit in the last seven days. I did not recommend an in person or video visit within seven days of my reply.    I spent a total of 10 minutes cumulative time within 7 days through [employer].  Marcial Pacas, MD

## 2023-11-07 ENCOUNTER — Other Ambulatory Visit

## 2023-11-07 ENCOUNTER — Other Ambulatory Visit: Payer: Self-pay | Admitting: *Deleted

## 2023-11-07 DIAGNOSIS — M7989 Other specified soft tissue disorders: Secondary | ICD-10-CM | POA: Diagnosis not present

## 2023-11-07 DIAGNOSIS — G9089 Other disorders of autonomic nervous system: Secondary | ICD-10-CM

## 2023-11-12 ENCOUNTER — Ambulatory Visit: Payer: Self-pay | Admitting: Neurology

## 2023-11-12 DIAGNOSIS — G9089 Other disorders of autonomic nervous system: Secondary | ICD-10-CM

## 2023-11-12 DIAGNOSIS — R269 Unspecified abnormalities of gait and mobility: Secondary | ICD-10-CM

## 2023-11-12 DIAGNOSIS — R634 Abnormal weight loss: Secondary | ICD-10-CM

## 2023-11-12 LAB — GANGLIONIC ACHRAB, SERUM: Ganglionic AChRab, Serum: <0.067 nmol/L (ref 0.000–0.066)

## 2023-11-13 ENCOUNTER — Ambulatory Visit: Admitting: Physician Assistant

## 2023-11-13 ENCOUNTER — Telehealth: Payer: Self-pay | Admitting: Hematology and Oncology

## 2023-11-13 DIAGNOSIS — H00014 Hordeolum externum left upper eyelid: Secondary | ICD-10-CM | POA: Diagnosis not present

## 2023-11-13 NOTE — Telephone Encounter (Signed)
 Scheduled appointments per 6/18 staff message. Called and left a VM with the appointment details for the patient.

## 2023-11-14 ENCOUNTER — Telehealth: Payer: Self-pay

## 2023-11-14 NOTE — Telephone Encounter (Signed)
 Urgent referral sent to [facility]. Faxed to [phone]. Images sent directly to [facility] via image share.

## 2023-11-14 NOTE — Telephone Encounter (Signed)
 Reached out to [facility]  to discuss their referral process. Spoke with Megan and was able to give a verbal urgent referral over the phone. Paper referral faxed to [phone]. (P) [phone]  Called and spoke with patient. Advised her that urgent referral was placed and that she could call in today to get scheduled.

## 2023-11-14 NOTE — Telephone Encounter (Signed)
 [email or web address] [phone].   Please sent urgent refer to [facility],   It is [facility] for me to discuss with them

## 2023-11-14 NOTE — Addendum Note (Signed)
 Addended by: Victorhugo Preis on: 11/14/2023 02:42 PM   Modules accepted: Orders

## 2023-11-18 ENCOUNTER — Ambulatory Visit (INDEPENDENT_AMBULATORY_CARE_PROVIDER_SITE_OTHER): Admitting: Physician Assistant

## 2023-11-18 ENCOUNTER — Encounter: Payer: Self-pay | Admitting: Physician Assistant

## 2023-11-18 ENCOUNTER — Other Ambulatory Visit: Payer: Self-pay

## 2023-11-18 DIAGNOSIS — M25552 Pain in left hip: Secondary | ICD-10-CM | POA: Diagnosis not present

## 2023-11-18 NOTE — Progress Notes (Signed)
 Office Visit Note   Patient: [name]           Date of Birth: [birth year]           MRN: [id number] Visit Date: 11/18/2023              Requested by: Shepard Ade, MD [address] [place],  [place] [address] PCP: Shepard Ade, MD  Chief Complaint  Patient presents with   Left Leg - Pain      HPI: Patient is a 78 year old woman who comes in today with left hip pain shooting down her leg.  She saw Dr. Sydell a couple years ago about the same problem she was told she had degenerative disc disease.  She did have injections but they did not help.  She did see Dr. Colon who is done surgery on her before she could not get an MRI but a myelogram was done.  She has pain in the left butt cheek that radiates to her knee and back.  She is also has a history of blood clots.  She has been referred to the [facility] in [place].  She wanted to come here first to rule out arthritis or bursitis in the hip.  And to see if the spinal stenosis has progressed she is looking for a second opinion.    Assessment & Plan: Visit Diagnoses:  1. Pain of left hip     Plan: Findings consistent with trochanteric bursitis.  With regards to her back issues she knows she can go back and see Dr. Colon but right now she is in the middle of an extensive neurology workup and expects to be going to [place] or the [place] clinic.  Cannot do an injection into her trochanteric bursa today because she is not supposed to have any steroids in her body.  We talked about topical medication.  Will also refer her to physical therapy she is at a higher risk because of her orthostatic hypotension and multiple falls.  Hopefully they can do some modalities that will be helpful for her with regards to her left hip replacement looks to be in good position without evidence of loosening  Follow-Up Instructions: Return if symptoms worsen or fail to improve.   Ortho Exam  Patient is alert, oriented,  no adenopathy, well-dressed, normal affect, normal respiratory effort. Examination of her left hip she is focally tender over the trochanteric bursa.  She has good range of motion of her hip without any reproduction of groin pain.  She has a negative straight leg raise she is neurovascularly intact    Imaging: No results found. No images are attached to the encounter.  Labs: Lab Results  Component Value Date   HGBA1C 6.1 (H) 09/19/2023   HGBA1C 5.7 (H) 04/10/2023   HGBA1C 5.4 03/22/2018   ESRSEDRATE 2 12/27/2021   ESRSEDRATE 4 12/09/2019   ESRSEDRATE 2 10/07/2014   CRP <1 12/27/2021   CRP <0.5 10/07/2014     Lab Results  Component Value Date   ALBUMIN 4.4 10/25/2023   ALBUMIN 3.9 10/08/2023   ALBUMIN 4.3 09/19/2023    Lab Results  Component Value Date   MG 2.2 12/31/2012   MG 2.2 12/28/2012   MG 2.2 12/27/2012   Lab Results  Component Value Date   VD25OH 49.4 06/10/2023   VD25OH 26.2 (L) 12/27/2021    No results found for: PREALBUMIN    Latest Ref Rng & Units 10/25/2023    3:24 PM 10/08/2023  4:29 PM 10/08/2023    4:24 PM  CBC EXTENDED  WBC 4.0 - 10.5 K/uL 9.6   9.0   RBC 3.87 - 5.11 MIL/uL 3.87   3.93   Hemoglobin 12.0 - 15.0 g/dL 87.6  87.0  87.4   HCT 36.0 - 46.0 % 37.3  38.0  39.3   Platelets 150 - 400 K/uL 239   251   NEUT# 1.7 - 7.7 K/uL 7.3   6.1   Lymph# 0.7 - 4.0 K/uL 1.8   2.4      There is no height or weight on file to calculate BMI.  Orders:  Orders Placed This Encounter  Procedures   XR HIP UNILAT W OR W/O PELVIS 2-3 VIEWS LEFT   Ambulatory referral to Physical Therapy   No orders of the defined types were placed in this encounter.    Procedures: No procedures performed  Clinical Data: No additional findings.  ROS:  All other systems negative, except as noted in the HPI. Review of Systems  Objective: Vital Signs: There were no vitals taken for this visit.  Specialty Comments:  No specialty comments available.  PMFS  History: Patient Active Problem List   Diagnosis Date Noted   Abnormal weight loss 10/21/2023   Deep vein thrombosis (DVT) of tibial vein of left lower extremity (HCC) 09/20/2023   Left leg swelling 09/19/2023   Urinary bladder disorder 07/01/2023   Insomnia 06/18/2023   Autonomic failure 06/04/2023   Focal neurological deficit 04/09/2023   Depression    Pain in both lower extremities 01/07/2022   Muscle ache 12/27/2021   Left lumbar radiculopathy 11/06/2021   Gait abnormality 11/06/2021   Trochanteric bursitis, left hip 05/04/2021   S/P Nissen fundoplication (without gastrostomy tube) procedure 03/01/2020   Pleuritic chest pain    Heart block AV complete (HCC) 05/19/2019   Chronic right shoulder pain 02/13/2017   Short Achilles tendon (acquired), left ankle 02/13/2017   Dizziness and giddiness 02/16/2016   Monoallelic mutation of CHEK2 gene 02/02/2015   Genetic testing 01/31/2015   Altered mental status 10/25/2014   Occipital neuralgia of left side    Cerebral infarction (HCC) brainstem s/p IV tPA (not seen on MRI) 10/07/2014   History of permanent cardiac pacemaker placement 10/07/2014   Cephalalgia    Complicated migraine    HLD (hyperlipidemia)    Headache 10/06/2014   Arthritis of left hip 06/12/2013   Status post THR (total hip replacement) 06/12/2013   Constipation 05/25/2013   Orthostatic hypotension 01/27/2013   Mechanical complication of cardiac pacemaker electrode 01/27/2013   Pre-syncope 01/02/2013   Sinus tachycardia 01/01/2013   Second degree heart block 12/27/2012   Hypokalemia 12/27/2012   Hypothyroidism 12/27/2012   Spigelian hernia 04/20/2011   DYSPNEA 05/09/2010   Malignant neoplasm of female breast (HCC) 05/05/2010   Asthma 05/05/2010   Diaphragmatic hernia 05/05/2010   Osteoarthritis 05/05/2010   Disorder of bone and cartilage 05/05/2010   Past Medical History:  Diagnosis Date   ADHD (attention deficit hyperactivity disorder)    Allergy     trees/pollen, mold, fungus, dust mites. Takes allergy shots   Arthritis    PAIN AND OA LEFT HIP   Asthma    allergist Dr Kozlo- monthly allergy injections   Autonomic dysfunction    CENTRAL NERVOUS SYSTEM NEUROPATHY - DX BY DR. MAURICE MORE THAN 10 YRS AGO - AND IT IS FELT TO CONTRIBUTE TO THE AUTOMIC  DYSFUNCTION-- PT HAS NUMBNESS LEGS AND FEET AND SOMETIIMES TIPS OF FINGER,  SEVERE CONSTIPATION( NO SENSATION TO HAVE BM ), DOUBLE VISION, ORTHOSTATIC HYPOTENSION   Blood transfusion    Breast cancer (HCC)    right side   Bruises easily    Cataract    Chest pain    a. 12/2012 Cath: nl cors, EF 55-65%.   Complication of anesthesia    reaction to some anesthetics/ 7/12 anesth record on chart- states prefers epidural   CVA (cerebrovascular accident) (HCC) 03/21/2018   Depression    Diverticulosis    Dysrhythmia    HX OF HIGH GRADE HEART BLOCK - REQUIRED PACEMAKER INSERTION   Esophageal stricture    Gastritis    GERD (gastroesophageal reflux disease)    H/O hiatal hernia    Hemorrhoids    Hernia of abdominal wall    spigelian hernia RLQ - SURGERY TO REPAIR   Hypothyroidism    Interstitial cystitis    Latex allergy, contact dermatitis    Mallory - Weiss tear    HEALED    Neuromuscular disorder (HCC)    central nervous system neuropathy- seen per Dr Maurice   Pacemaker    Pericardial effusion    a. 12/2012 following ppm placement;  b. 01/01/2013 Echo: EF 55-60%, small pericardial effusion w/o RV collapse-->No need for tap/window.   PONV (postoperative nausea and vomiting)    pt needs scop patch   Recurrent upper respiratory infection (URI) 1/13- to present   bronchitis following surgery- states improved but still with cough. OV with [facility] Dr Shepard 09/06/11 on chart   Shortness of breath    AT TIMES - BUT MUCH IMPROVED AFTER PACEMAKER WAS REPROGRAMED.   Symptomatic bradycardia    a. 12/2012 s/p MDT dual chamber PPM, ser # [id number]; b. 12/2012 post-op course complicated by pericardial  effusinon req lead revision.   Thyroid  disease     Family History  Problem Relation Age of Onset   Heart disease Father    Colon cancer Mother 1   Depression Mother    Pancreatic cancer Sister 37   Diverticulitis Sister    Uterine cancer Maternal Grandmother    Heart disease Brother    Heart disease Paternal Grandmother    Heart disease Paternal Grandfather    Throat cancer Maternal Uncle    Heart disease Paternal Aunt    Heart disease Paternal Uncle    Heart disease Brother    Thrombosis Maternal Aunt    Cancer Maternal Uncle        NOS   Diabetes Other        aunt    Past Surgical History:  Procedure Laterality Date   ABDOMINAL HYSTERECTOMY  1974   BACK SURGERY     cervical fusion 4-5 with plate   BLADDER SUSPENSION     BREAST BIOPSY  2002   NO BLOOD PRESSURES ON RIGHT SIDE/   s/p  axillary node dissection   CYSTO WITH HYDRODISTENSION  09/07/2011   Procedure: CYSTOSCOPY/HYDRODISTENSION;  Surgeon: Arlena LILLETTE Gal, MD;  Location: [facility];  Service: Urology;  Laterality: N/A;  INSTILLATION OF MARCAINE /PYRIDIUM  INSTILLATION OF MARCAINE /KENALOG    CYSTOSCOPY  1975, 2007   EYE SURGERY     LASIK EYE SURGERY BILATERAL   LAPAROSCOPY  1973   LEAD REVISION N/A 12/30/2012   Procedure: LEAD REVISION;  Surgeon: Danelle LELON Birmingham, MD;  Location: [facility] CATH LAB;  Service: Cardiovascular;  Laterality: N/A;   LEFT HEART CATHETERIZATION WITH CORONARY ANGIOGRAM N/A 12/29/2012   Procedure: LEFT HEART CATHETERIZATION WITH CORONARY ANGIOGRAM;  Surgeon: Maude HERO  [place], MD;  Location: [facility] CATH LAB;  Service: Cardiovascular;  Laterality: N/A;   MASTECTOMY MODIFIED RADICAL     right; with immediate reconstruction   MYRINGOPLASTY  1962   NECK SURGERY     c4-5 ruptured disc   OOPHORECTOMY  1982   PACEMAKER INSERTION     PERMANENT PACEMAKER INSERTION N/A 12/29/2012   Procedure: PERMANENT PACEMAKER INSERTION;  Surgeon: Elspeth JAYSON Sage, MD;  Location: [facility] CATH LAB;  Service: Cardiovascular;  Laterality:  N/A;   PPM GENERATOR CHANGEOUT N/A 02/27/2023   Procedure: PPM GENERATOR CHANGEOUT;  Surgeon: Sage Elspeth JAYSON, MD;  Location: [facility] INVASIVE CV LAB;  Service: Cardiovascular;  Laterality: N/A;   RECTOCELE REPAIR     with cystocele repair   TONSILLECTOMY     TOTAL HIP ARTHROPLASTY Right    TOTAL HIP ARTHROPLASTY Left 06/12/2013   Procedure: LEFT TOTAL HIP ARTHROPLASTY ANTERIOR APPROACH;  Surgeon: Lonni CINDERELLA Poli, MD;  Location: [facility];  Service: Orthopedics;  Laterality: Left;   TYMPANOPLASTY  1973   right   ULNAR NERVE TRANSPOSITION Right    VENTRAL HERNIA REPAIR  05/30/2011   Procedure: HERNIA REPAIR VENTRAL ADULT;  Surgeon: Sherlean JINNY Laughter, MD;  Location: [facility];  Service: General;  Laterality: Right;  repair right spigelian hernia   Social History   Occupational History   Occupation: Retired    Associate Professor: [employer]    Comment: vp corporate affairs [employer]    Employer: [employer]  Tobacco Use   Smoking status: Never   Smokeless tobacco: Never  Vaping Use   Vaping status: Never Used  Substance and Sexual Activity   Alcohol  use: Yes    Alcohol /week: 7.0 standard drinks of alcohol     Types: 7 Glasses of wine per week    Comment: occasional   Drug use: No   Sexual activity: Not on file

## 2023-11-20 ENCOUNTER — Encounter: Payer: Self-pay | Admitting: Neurology

## 2023-11-20 DIAGNOSIS — Z800 Family history of malignant neoplasm of digestive organs: Secondary | ICD-10-CM | POA: Diagnosis not present

## 2023-11-20 DIAGNOSIS — K52831 Collagenous colitis: Secondary | ICD-10-CM | POA: Diagnosis not present

## 2023-11-20 DIAGNOSIS — Z7952 Long term (current) use of systemic steroids: Secondary | ICD-10-CM | POA: Diagnosis not present

## 2023-11-20 DIAGNOSIS — K219 Gastro-esophageal reflux disease without esophagitis: Secondary | ICD-10-CM | POA: Diagnosis not present

## 2023-11-20 DIAGNOSIS — F320 Major depressive disorder, single episode, mild: Secondary | ICD-10-CM | POA: Diagnosis not present

## 2023-11-22 DIAGNOSIS — Z7952 Long term (current) use of systemic steroids: Secondary | ICD-10-CM | POA: Diagnosis not present

## 2023-11-22 DIAGNOSIS — K52831 Collagenous colitis: Secondary | ICD-10-CM | POA: Diagnosis not present

## 2023-11-26 ENCOUNTER — Telehealth: Payer: Self-pay

## 2023-11-26 ENCOUNTER — Other Ambulatory Visit: Payer: Self-pay

## 2023-11-26 DIAGNOSIS — M5416 Radiculopathy, lumbar region: Secondary | ICD-10-CM

## 2023-11-26 DIAGNOSIS — G9089 Other disorders of autonomic nervous system: Secondary | ICD-10-CM

## 2023-11-26 DIAGNOSIS — I6312 Cerebral infarction due to embolism of basilar artery: Secondary | ICD-10-CM

## 2023-11-26 DIAGNOSIS — I951 Orthostatic hypotension: Secondary | ICD-10-CM

## 2023-11-26 DIAGNOSIS — M79604 Pain in right leg: Secondary | ICD-10-CM

## 2023-11-26 DIAGNOSIS — M629 Disorder of muscle, unspecified: Secondary | ICD-10-CM

## 2023-11-26 NOTE — Telephone Encounter (Signed)
 Called [facility] to follow up on urgent referral sent 7/3. Spoke with operator who sent a stat message to the autonomic clinic to follow up on this referral.  I am going to resend the referral to [facility] just in case they have not received it.  I spoke with the patient and gave her updates on both of these referrals. She is asking for a referral to the [facility] in [place]  as well. Is this okay to send?

## 2023-11-26 NOTE — Telephone Encounter (Signed)
 Received fax from [facility] advising Unfortunately, our neurology clinic for Dysautonomia and related Diagnosis (autonomic failure) is not accepting new out of [place] patients. We apologize for the inconvenience and do not have an estimate as to when or if this will change

## 2023-11-26 NOTE — Telephone Encounter (Signed)
 Urgent referral was faxed to :  [facility]  [address] [place], [place] [address]  (P) [phone]

## 2023-11-27 ENCOUNTER — Ambulatory Visit: Payer: Medicare Other

## 2023-11-27 DIAGNOSIS — I441 Atrioventricular block, second degree: Secondary | ICD-10-CM | POA: Diagnosis not present

## 2023-11-28 LAB — CUP PACEART REMOTE DEVICE CHECK
Battery Remaining Longevity: 131 mo
Battery Voltage: 3.09 V
Brady Statistic AP VP Percent: 0.48 %
Brady Statistic AP VS Percent: 0 %
Brady Statistic AS VP Percent: 99.52 %
Brady Statistic AS VS Percent: 0 %
Brady Statistic RA Percent Paced: 0.48 %
Brady Statistic RV Percent Paced: 100 %
Date Time Interrogation Session: 20250715223816
Implantable Lead Connection Status: 753985
Implantable Lead Connection Status: 753985
Implantable Lead Implant Date: 20140818
Implantable Lead Implant Date: 20140818
Implantable Lead Location: 753859
Implantable Lead Location: 753860
Implantable Lead Model: 5076
Implantable Lead Model: 5076
Implantable Pulse Generator Implant Date: 20241016
Lead Channel Impedance Value: 304 Ohm
Lead Channel Impedance Value: 437 Ohm
Lead Channel Impedance Value: 475 Ohm
Lead Channel Impedance Value: 513 Ohm
Lead Channel Pacing Threshold Amplitude: 0.625 V
Lead Channel Pacing Threshold Amplitude: 0.875 V
Lead Channel Pacing Threshold Pulse Width: 0.4 ms
Lead Channel Pacing Threshold Pulse Width: 0.4 ms
Lead Channel Sensing Intrinsic Amplitude: 1.25 mV
Lead Channel Sensing Intrinsic Amplitude: 1.25 mV
Lead Channel Setting Pacing Amplitude: 1.75 V
Lead Channel Setting Pacing Amplitude: 2.5 V
Lead Channel Setting Pacing Pulse Width: 0.4 ms
Lead Channel Setting Sensing Sensitivity: 1.2 mV
Zone Setting Status: 755011
Zone Setting Status: 755011

## 2023-11-29 NOTE — Telephone Encounter (Signed)
-  [facility] called stating that they could not schedule pt due  to them being backed up  at this time .   Callback number [phone]

## 2023-12-05 ENCOUNTER — Other Ambulatory Visit: Payer: Self-pay

## 2023-12-05 ENCOUNTER — Ambulatory Visit (INDEPENDENT_AMBULATORY_CARE_PROVIDER_SITE_OTHER)

## 2023-12-05 DIAGNOSIS — R2681 Unsteadiness on feet: Secondary | ICD-10-CM

## 2023-12-05 DIAGNOSIS — M25552 Pain in left hip: Secondary | ICD-10-CM | POA: Diagnosis not present

## 2023-12-05 DIAGNOSIS — M6281 Muscle weakness (generalized): Secondary | ICD-10-CM | POA: Diagnosis not present

## 2023-12-05 DIAGNOSIS — G64 Other disorders of peripheral nervous system: Secondary | ICD-10-CM | POA: Insufficient documentation

## 2023-12-05 DIAGNOSIS — G6289 Other specified polyneuropathies: Secondary | ICD-10-CM | POA: Insufficient documentation

## 2023-12-05 NOTE — Therapy (Signed)
 OUTPATIENT PHYSICAL THERAPY LOWER EXTREMITY EVALUATION   Patient Name: [name] MRN: [id number] DOB:1945/12/29, 78 y.o., female Today's Date: 12/05/2023  END OF SESSION:   PT End of Session - 12/05/23 1444     Visit Number 1    Number of Visits 25    Date for PT Re-Evaluation 02/27/24    PT Start Time 1310    PT Stop Time 1348    PT Time Calculation (min) 38 min    Activity Tolerance Patient limited by fatigue    Behavior During Therapy [facility] for tasks assessed/performed          Past Medical History:  Diagnosis Date   ADHD (attention deficit hyperactivity disorder)    Allergy    trees/pollen, mold, fungus, dust mites. Takes allergy shots   Arthritis    PAIN AND OA LEFT HIP   Asthma    allergist Dr Kozlo- monthly allergy injections   Autonomic dysfunction    CENTRAL NERVOUS SYSTEM NEUROPATHY - DX BY DR. MAURICE MORE THAN 10 YRS AGO - AND IT IS FELT TO CONTRIBUTE TO THE AUTOMIC  DYSFUNCTION-- PT HAS NUMBNESS LEGS AND FEET AND SOMETIIMES TIPS OF FINGER, SEVERE CONSTIPATION( NO SENSATION TO HAVE BM ), DOUBLE VISION, ORTHOSTATIC HYPOTENSION   Blood transfusion    Breast cancer (HCC)    right side   Bruises easily    Cataract    Chest pain    a. 12/2012 Cath: nl cors, EF 55-65%.   Complication of anesthesia    reaction to some anesthetics/ 7/12 anesth record on chart- states prefers epidural   CVA (cerebrovascular accident) (HCC) 03/21/2018   Depression    Diverticulosis    Dysrhythmia    HX OF HIGH GRADE HEART BLOCK - REQUIRED PACEMAKER INSERTION   Esophageal stricture    Gastritis    GERD (gastroesophageal reflux disease)    H/O hiatal hernia    Hemorrhoids    Hernia of abdominal wall    spigelian hernia RLQ - SURGERY TO REPAIR   Hypothyroidism    Interstitial cystitis    Latex allergy, contact dermatitis    Mallory - Weiss tear    HEALED    Neuromuscular disorder (HCC)    central nervous system neuropathy- seen per Dr MAURICE   Pacemaker    Pericardial  effusion    a. 12/2012 following ppm placement;  b. 01/01/2013 Echo: EF 55-60%, small pericardial effusion w/o RV collapse-->No need for tap/window.   PONV (postoperative nausea and vomiting)    pt needs scop patch   Recurrent upper respiratory infection (URI) 1/13- to present   bronchitis following surgery- states improved but still with cough. OV with [facility] Dr Shepard 09/06/11 on chart   Shortness of breath    AT TIMES - BUT MUCH IMPROVED AFTER PACEMAKER WAS REPROGRAMED.   Symptomatic bradycardia    a. 12/2012 s/p MDT dual chamber PPM, ser # [id number]; b. 12/2012 post-op course complicated by pericardial effusinon req lead revision.   Thyroid  disease    Past Surgical History:  Procedure Laterality Date   ABDOMINAL HYSTERECTOMY  1974   BACK SURGERY     cervical fusion 4-5 with plate   BLADDER SUSPENSION     BREAST BIOPSY  2002   NO BLOOD PRESSURES ON RIGHT SIDE/   s/p  axillary node dissection   CYSTO WITH HYDRODISTENSION  09/07/2011   Procedure: CYSTOSCOPY/HYDRODISTENSION;  Surgeon: Arlena LILLETTE Gal, MD;  Location: [facility];  Service: Urology;  Laterality: N/A;  INSTILLATION OF  MARCAINE /PYRIDIUM  INSTILLATION OF MARCAINE /KENALOG    CYSTOSCOPY  1975, 2007   EYE SURGERY     LASIK EYE SURGERY BILATERAL   LAPAROSCOPY  1973   LEAD REVISION N/A 12/30/2012   Procedure: LEAD REVISION;  Surgeon: Danelle LELON Birmingham, MD;  Location: [facility] CATH LAB;  Service: Cardiovascular;  Laterality: N/A;   LEFT HEART CATHETERIZATION WITH CORONARY ANGIOGRAM N/A 12/29/2012   Procedure: LEFT HEART CATHETERIZATION WITH CORONARY ANGIOGRAM;  Surgeon: Peter M Swaziland, MD;  Location: [facility] CATH LAB;  Service: Cardiovascular;  Laterality: N/A;   MASTECTOMY MODIFIED RADICAL     right; with immediate reconstruction   MYRINGOPLASTY  1962   NECK SURGERY     c4-5 ruptured disc   OOPHORECTOMY  1982   PACEMAKER INSERTION     PERMANENT PACEMAKER INSERTION N/A 12/29/2012   Procedure: PERMANENT PACEMAKER INSERTION;  Surgeon: Elspeth JAYSON Sage, MD;  Location: [facility] CATH LAB;  Service: Cardiovascular;  Laterality: N/A;   PPM GENERATOR CHANGEOUT N/A 02/27/2023   Procedure: PPM GENERATOR CHANGEOUT;  Surgeon: Sage Elspeth JAYSON, MD;  Location: [facility] INVASIVE CV LAB;  Service: Cardiovascular;  Laterality: N/A;   RECTOCELE REPAIR     with cystocele repair   TONSILLECTOMY     TOTAL HIP ARTHROPLASTY Right    TOTAL HIP ARTHROPLASTY Left 06/12/2013   Procedure: LEFT TOTAL HIP ARTHROPLASTY ANTERIOR APPROACH;  Surgeon: Lonni CINDERELLA Poli, MD;  Location: [facility];  Service: Orthopedics;  Laterality: Left;   TYMPANOPLASTY  1973   right   ULNAR NERVE TRANSPOSITION Right    VENTRAL HERNIA REPAIR  05/30/2011   Procedure: HERNIA REPAIR VENTRAL ADULT;  Surgeon: Sherlean JINNY Laughter, MD;  Location: [facility];  Service: General;  Laterality: Right;  repair right spigelian hernia   Patient Active Problem List   Diagnosis Date Noted   Abnormal weight loss 10/21/2023   Deep vein thrombosis (DVT) of tibial vein of left lower extremity (HCC) 09/20/2023   Left leg swelling 09/19/2023   Urinary bladder disorder 07/01/2023   Insomnia 06/18/2023   Autonomic failure 06/04/2023   Focal neurological deficit 04/09/2023   Depression    Pain in both lower extremities 01/07/2022   Muscle ache 12/27/2021   Left lumbar radiculopathy 11/06/2021   Gait abnormality 11/06/2021   Trochanteric bursitis, left hip 05/04/2021   S/P Nissen fundoplication (without gastrostomy tube) procedure 03/01/2020   Pleuritic chest pain    Heart block AV complete (HCC) 05/19/2019   Chronic right shoulder pain 02/13/2017   Short Achilles tendon (acquired), left ankle 02/13/2017   Dizziness and giddiness 02/16/2016   Monoallelic mutation of CHEK2 gene 02/02/2015   Genetic testing 01/31/2015   Altered mental status 10/25/2014   Occipital neuralgia of left side    Cerebral infarction (HCC) brainstem s/p IV tPA (not seen on MRI) 10/07/2014   History of permanent cardiac  pacemaker placement 10/07/2014   Cephalalgia    Complicated migraine    HLD (hyperlipidemia)    Headache 10/06/2014   Arthritis of left hip 06/12/2013   Status post THR (total hip replacement) 06/12/2013   Constipation 05/25/2013   Orthostatic hypotension 01/27/2013   Mechanical complication of cardiac pacemaker electrode 01/27/2013   Pre-syncope 01/02/2013   Sinus tachycardia 01/01/2013   Second degree heart block 12/27/2012   Hypokalemia 12/27/2012   Hypothyroidism 12/27/2012   Spigelian hernia 04/20/2011   DYSPNEA 05/09/2010   Malignant neoplasm of female breast (HCC) 05/05/2010   Asthma 05/05/2010   Diaphragmatic hernia 05/05/2010   Osteoarthritis 05/05/2010  Disorder of bone and cartilage 05/05/2010    PCP: Shepard Ade, MD   REFERRING PROVIDER: Persons, Ronal Dragon, [place]   REFERRING DIAG: Ref dx- [phone] (ICD-10-CM) - Pain of left hip  THERAPY DIAG:  Pain in left hip  Muscle weakness (generalized)  Unsteadiness on feet  Rationale for Evaluation and Treatment: Rehabilitation  ONSET DATE: 2-3 weeks ago  SUBJECTIVE:   SUBJECTIVE STATEMENT: Pt c/o left hip pain that started a few weeks ago.  Tylenol  helps decrease the pain about 50%.  Pain is at left lateral hip and radiates to the L knee.  Pt ambulates with RW since November 2024.  Dealing with  a type of neuro degenerative disorder.  It is listed as - central nervous system neuropathy.  She is awaiting a specialist appointment and is phasing out steroids so did not get a steroid shot.   PERTINENT HISTORY: From referring MD visit--Plan: Findings consistent with trochanteric bursitis.  With regards to her back issues she knows she can go back and see Dr. Colon but right now she is in the middle of an extensive neurology workup and expects to be going to [place] or the [place] clinic.  Cannot do an injection into her trochanteric bursa today because she is not supposed to have any steroids in her body.  We  talked about topical medication.  Will also refer her to physical therapy she is at a higher risk because of her orthostatic hypotension and multiple falls.  Hopefully they can do some modalities that will be helpful for her with regards to her left hip replacement looks to be in good position without evidence of loosening   PAIN:  Are you having pain? Yes: NPRS scale: 8/10 with Tylenol  5/10 Pain location: L lateral  Pain description: dull, sharp Aggravating factors: turning in bed, walking Relieving factors: medication  PRECAUTION:    *Orthostatic hypotension  RED FLAGS:     WEIGHT BEARING RESTRICTIONS: No  FALLS:  Has patient fallen in last 6 months? Falls around Nov-Dec  LIVING ENVIRONMENT: Lives with: lives with their spouse Lives in: House/apartment Stairs: No Has following equipment at home: Environmental consultant - 2 wheeled  OCCUPATION: retired  PLOF: Needs assistance with ADLs  PATIENT GOALS: Decrease hip pain  NEXT MD VISIT: Unknown  OBJECTIVE:  Note: Objective measures were completed at Evaluation unless otherwise noted.  DIAGNOSTIC FINDINGS: xray of hip shows no problems with prosthesis  PATIENT SURVEYS:  PSFS: THE PATIENT SPECIFIC FUNCTIONAL SCALE  Place score of 0-10 (0 = unable to perform activity and 10 = able to perform activity at the same level as before injury or problem)  Activity Date: 12/05/23    walking 4    2.dressing 7    3.transfers 5    4.      Total Score 5.33      Total Score = Sum of activity scores/number of activities  Minimally Detectable Change: 3 points (for single activity); 2 points (for average score)  [facility] (nd). The Patient Specific Functional Scale . Retrieved from [email or web address]   COGNITION: Overall cognitive status: Within functional limits for tasks assessed     SENSATION: WFL  MUSCLE LENGTH: Hamstrings:  Left 50 deg   POSTURE: rounded shoulders,  forward head, decreased lumbar lordosis, and flexed trunk   PALPATION: 3T tenderness at L GT bursa  LOWER EXTREMITY ROM:  Active ROM Right eval Left eval  Hip flexion  25  Hip extension    Hip abduction  40  Hip adduction    Hip internal rotation    Hip external rotation    Knee flexion    Knee extension    Ankle dorsiflexion    Ankle plantarflexion    Ankle inversion    Ankle eversion     (Blank rows = not tested)  LOWER EXTREMITY MMT:  MMT Right eval Left eval  Hip flexion  2+  Hip extension    Hip abduction  2+  Hip adduction  4  Hip internal rotation    Hip external rotation    Knee flexion    Knee extension    Ankle dorsiflexion  4  Ankle plantarflexion    Ankle inversion    Ankle eversion     (Blank rows = not tested)  LOWER EXTREMITY SPECIAL TESTS:  Hip special tests: Belvie (FABER) test: negative and Piriformis test: negative  FUNCTIONAL TESTS:  5 times sit to stand: Unable 2 completed in 50 sec  GAIT: Distance walked: household Assistive device utilized: Environmental consultant - 2 wheeled Level of assistance: SBA Comments:                                                                                                                                 TREATMENT DATE: 12/05/23  Manual treatment - STM at TFL, manual hip ROM, Manual hamstring stretching Education/self care  PATIENT EDUCATION:  Education details: Education in transfer and ambulation tips to decrease irritating the hip  Person educated: Patient and Spouse Education method: Explanation, Demonstration, Tactile cues, and Verbal cues Education comprehension: verbalized understanding and returned demonstration  HOME EXERCISE PROGRAM: Next visit  ASSESSMENT:  CLINICAL IMPRESSION: Patient is a 78 y.o. female  presenting to PT with c/o left lateral hip pain.  After consult with MD and Xray she was referred to PT for trochanteric bursitis.  She presents with decreased ROM, decreased strength, altered  gait and pain. She will benefit from skilled PT to address these deficits and return to previous LOA. OBJECTIVE IMPAIRMENTS: Abnormal gait, decreased balance, decreased coordination, decreased mobility, difficulty walking, decreased ROM, decreased strength, dizziness, increased fascial restrictions, impaired flexibility, postural dysfunction, and pain.   ACTIVITY LIMITATIONS: sitting, standing, squatting, transfers, bed mobility, and dressing  PARTICIPATION LIMITATIONS: community activity  PERSONAL FACTORS: Age and 3+ comorbidities:   are also affecting patient's functional outcome.   REHAB POTENTIAL: Fair See above  CLINICAL DECISION MAKING: Stable/uncomplicated  EVALUATION COMPLEXITY: Moderate   GOALS: Goals reviewed with patient? Yes  SHORT TERM GOALS: Target date: 01/16/2024   Pt to be independent with HEP. Baseline: Goal status: INITIAL  2.  Decrease pain in left hip by 2 levels. Baseline:  Goal status: INITIAL  LONG TERM GOALS: Target date: 02/27/2024    Decrease tenderness to 1+ at left GT Baseline:  Goal status: INITIAL  2.  Increase strength by 1/2 to 1 full grade of L LE.   Baseline: 2+/5 Goal status: INITIAL  3.  Increase  hamstring flexibility by 50%. Baseline:  Goal status: INITIAL  4.  Ease pain to <5/10 in L hip with all activities. Baseline: 8/10 Goal status: INITIAL  5.  Improve PSFS score by at least 2 levels before discharge. Baseline: 5.33 Goal status: INITIAL   PLAN:  PT FREQUENCY: 1-2x/week  PT DURATION: 12 weeks  PLANNED INTERVENTIONS: 97164- PT Re-evaluation, 97110-Therapeutic exercises, 97530- Therapeutic activity, 97112- Neuromuscular re-education, 97535- Self Care, [id number]- Manual therapy, [phone]- Gait training, Patient/Family education, Joint mobilization, Cryotherapy, and Moist heat  PLAN FOR NEXT SESSION: Start on plinth exercises with bolster under head and print a few for HEP.   Burnard CHRISTELLA Meth, PT 12/05/2023, 2:47 PM

## 2023-12-09 ENCOUNTER — Encounter: Payer: Self-pay | Admitting: Neurology

## 2023-12-15 NOTE — Progress Notes (Unsigned)
 Cardiology Office Note:  .   Date:  12/15/2023  ID:  [name], DOB 01-26-1946, MRN [id number] PCP: Shepard Ade, MD  [facility] Providers Cardiologist:  None Electrophysiologist:  Elspeth Sage, MD {  History of Present Illness: [name] is a 78 y.o. female w/PMHx of  Breast ca, asthma, stroke CHB w/PPM ADHD Autonomic dysfunction Central poly neuropathy  She saw Dr Sage 06/10/23 Noted previously followed by Lavern Mayhew. She was last seen 6/19 Salt and proamatine  Since then neurology with a diagnosis of acute autonomic failure and droxidopa  was initiated; lack of heart rate response with a 60 mm drop in blood pressure consistent with this diagnosis  Had recently been in the ER w/stroke-like sx > dx with complex migraine Subsequently for days bedboud with recurrent syncope upon standing > eventually abating Planned to reach out to Dr. Onita increase her her ProAmatine  to 20 mg 3 times a day. We discussed fludrocortisone  and with her 80 to 100 mm drop in systolic blood pressure, trying to keep her systolic pressures during the day in the 1 60-1 80 range I think improved the likelihood that she will be able to stand up. To try to mitigate nocturnal hypertension, have recommended they raise the head of the bed 4 inches.   Saw heme-onc 10/25/23 for DVT (age indeterminate) LLE Felt to have been unprovoked, advised continued OAC planned for heme w/u labs (was negative)   Today's visit is scheduled as a 6 mo visit ROS:   *** needs EP > no reader yet >> [facility] *** charge to in clinic EP *** c/w neurology team   Device information MDT dual chamber PPM implanted 12/29/2012, gen change 02/27/23  Initial PPM complicated by: microperforation modest effusion and then adderall withdrawal;    Studies Reviewed: Courtney    EKG done today and reviewed by myself:  ***  DEVICE interrogation done today and reviewed by myself *** Battery and lead measurements are  good ***   02/12/23: TTE 1. Left ventricular ejection fraction, by estimation, is 55 to 60%. The  left ventricle has normal function. The left ventricle has no regional  wall motion abnormalities. There is mild concentric left ventricular  hypertrophy. Left ventricular diastolic  parameters are consistent with Grade I diastolic dysfunction (impaired  relaxation).   2. Right ventricular systolic function is normal. The right ventricular  size is normal.   3. Left atrial size was mildly dilated.   4. Right atrial size was mildly dilated.   5. The mitral valve is normal in structure. Trivial mitral valve  regurgitation. No evidence of mitral stenosis.   6. The aortic valve is tricuspid. There is mild calcification of the  aortic valve. Aortic valve regurgitation is not visualized. Aortic valve  sclerosis/calcification is present, without any evidence of aortic  stenosis.   7. The inferior vena cava is normal in size with greater than 50%  respiratory variability, suggesting right atrial pressure of 3 mmHg.     Risk Assessment/Calculations:    Physical Exam:   VS:  There were no vitals taken for this visit.   Wt Readings from Last 3 Encounters:  10/25/23 159 lb 8 oz (72.3 kg)  10/08/23 158 lb 4.6 oz (71.8 kg)  09/19/23 151 lb 8 oz (68.7 kg)    GEN: Well nourished, well developed in no acute distress NECK: No JVD; No carotid bruits CARDIAC: ***RRR, no murmurs, rubs, gallops RESPIRATORY:  *** CTA b/l without rales, wheezing  or rhonchi  ABDOMEN: Soft, non-tender, non-distended EXTREMITIES: *** No edema; No deformity   PPM site: *** is stable, no thinning, fluctuation, tethering  ASSESSMENT AND PLAN: .    PPM *** intact function *** no programming changes made  dysautonomia *** C/w Dr. Thomasene Koyanagi you ordering a CV Procedure (e.g. stress test, cath, DCCV, TEE, etc)?   Press F2        :[id number]}     Dispo: ***  Signed, Charlies Macario Arthur, PA-C

## 2023-12-17 ENCOUNTER — Telehealth: Payer: Self-pay | Admitting: *Deleted

## 2023-12-17 ENCOUNTER — Ambulatory Visit: Attending: Physician Assistant | Admitting: Physician Assistant

## 2023-12-17 VITALS — BP 144/76 | HR 70 | Ht 67.0 in | Wt 163.0 lb

## 2023-12-17 DIAGNOSIS — Z950 Presence of cardiac pacemaker: Secondary | ICD-10-CM | POA: Insufficient documentation

## 2023-12-17 LAB — CUP PACEART INCLINIC DEVICE CHECK
Battery Remaining Longevity: 129 mo
Battery Voltage: 3.07 V
Brady Statistic AP VP Percent: 9.8 %
Brady Statistic AP VS Percent: 0 %
Brady Statistic AS VP Percent: 90.19 %
Brady Statistic AS VS Percent: 0 %
Brady Statistic RA Percent Paced: 9.8 %
Brady Statistic RV Percent Paced: 100 %
Date Time Interrogation Session: 20250805173110
Implantable Lead Connection Status: 753985
Implantable Lead Connection Status: 753985
Implantable Lead Implant Date: 20140818
Implantable Lead Implant Date: 20140818
Implantable Lead Location: 753859
Implantable Lead Location: 753860
Implantable Lead Model: 5076
Implantable Lead Model: 5076
Implantable Pulse Generator Implant Date: 20241016
Lead Channel Impedance Value: 304 Ohm
Lead Channel Impedance Value: 437 Ohm
Lead Channel Impedance Value: 475 Ohm
Lead Channel Impedance Value: 494 Ohm
Lead Channel Pacing Threshold Amplitude: 0.75 V
Lead Channel Pacing Threshold Amplitude: 0.875 V
Lead Channel Pacing Threshold Pulse Width: 0.4 ms
Lead Channel Pacing Threshold Pulse Width: 0.4 ms
Lead Channel Sensing Intrinsic Amplitude: 1.125 mV
Lead Channel Sensing Intrinsic Amplitude: 1.25 mV
Lead Channel Setting Pacing Amplitude: 1.75 V
Lead Channel Setting Pacing Amplitude: 2.5 V
Lead Channel Setting Pacing Pulse Width: 0.4 ms
Lead Channel Setting Sensing Sensitivity: 1.2 mV
Zone Setting Status: 755011
Zone Setting Status: 755011

## 2023-12-17 MED ORDER — PYRIDOSTIGMINE BROMIDE 60 MG PO TABS: 30.0000 mg | ORAL_TABLET | Freq: Every day | ORAL | Status: AC

## 2023-12-17 MED ORDER — DROXIDOPA 100 MG PO CAPS: 100.0000 mg | ORAL_CAPSULE | Freq: Three times a day (TID) | ORAL | Status: AC

## 2023-12-17 MED ORDER — MIDODRINE HCL 10 MG PO TABS: 10.0000 mg | ORAL_TABLET | Freq: Three times a day (TID) | ORAL | Status: AC

## 2023-12-17 NOTE — Patient Instructions (Signed)
 Medication Instructions:   Your physician recommends that you continue on your current medications as directed. Please refer to the Current Medication list given to you today.   *If you need a refill on your cardiac medications before your next appointment, please call your pharmacy*   Lab Work: NONE ORDERED  TODAY    If you have labs (blood work) drawn today and your tests are completely normal, you will receive your results only by: MyChart Message (if you have MyChart) OR A paper copy in the mail If you have any lab test that is abnormal or we need to change your treatment, we will call you to review the results.    Testing/Procedures: NONE ORDERED  TODAY     Follow-Up: At [facility], you and your health needs are our priority.  As part of our continuing mission to provide you with exceptional heart care, our providers are all part of one team.  This team includes your primary Cardiologist (physician) and Advanced Practice Providers or APPs (Physician Assistants and Nurse Practitioners) who all work together to provide you with the care you need, when you need it.   Your next appointment:    6 month(s)   Provider:    Ole Holts, MD    We recommend signing up for the patient portal called MyChart.  Sign up information is provided on this After Visit Summary.  MyChart is used to connect with patients for Virtual Visits (Telemedicine).  Patients are able to view lab/test results, encounter notes, upcoming appointments, etc.  Non-urgent messages can be sent to your provider as well.   To learn more about what you can do with MyChart, go to [email or web address].   Other Instructions

## 2023-12-17 NOTE — Telephone Encounter (Signed)
 Mr. [name] called office - patient is having colonoscopy on 12/24/23 and needs directions on when to start holding Eliquis .  Dr. Federico informed.  Per Dr. Federico - hold Eliquis  starting 2 days prior to procedure and resume day after. If Gastroenterologist notifies patient of increased bleeding during procedure, please contact this office.  Mr. [name] given directions as per Dr. Federico and verbalized understanding. Encouraged to call office for further questions or concerns.

## 2023-12-18 ENCOUNTER — Ambulatory Visit: Payer: Self-pay | Admitting: Cardiology

## 2023-12-18 DIAGNOSIS — Z1212 Encounter for screening for malignant neoplasm of rectum: Secondary | ICD-10-CM | POA: Diagnosis not present

## 2023-12-18 DIAGNOSIS — E039 Hypothyroidism, unspecified: Secondary | ICD-10-CM | POA: Diagnosis not present

## 2023-12-18 DIAGNOSIS — E785 Hyperlipidemia, unspecified: Secondary | ICD-10-CM | POA: Diagnosis not present

## 2023-12-20 DIAGNOSIS — R82998 Other abnormal findings in urine: Secondary | ICD-10-CM | POA: Diagnosis not present

## 2023-12-22 NOTE — Therapy (Signed)
 OUTPATIENT PHYSICAL THERAPY LOWER EXTREMITY TREATMENT   Patient Name: [name] MRN: [id number] DOB:10-Nov-1945, 78 y.o., female Today's Date: 12/23/2023  END OF SESSION:   PT End of Session - 12/23/23 1609     Visit Number 2    Number of Visits 25    Date for PT Re-Evaluation 02/27/24    PT Start Time 1430    PT Stop Time 1510    PT Time Calculation (min) 40 min    Activity Tolerance Patient tolerated treatment well    Behavior During Therapy WFL for tasks assessed/performed           Past Medical History:  Diagnosis Date   ADHD (attention deficit hyperactivity disorder)    Allergy    trees/pollen, mold, fungus, dust mites. Takes allergy shots   Arthritis    PAIN AND OA LEFT HIP   Asthma    allergist Dr Kozlo- monthly allergy injections   Autonomic dysfunction    CENTRAL NERVOUS SYSTEM NEUROPATHY - DX BY DR. MAURICE MORE THAN 10 YRS AGO - AND IT IS FELT TO CONTRIBUTE TO THE AUTOMIC  DYSFUNCTION-- PT HAS NUMBNESS LEGS AND FEET AND SOMETIIMES TIPS OF FINGER, SEVERE CONSTIPATION( NO SENSATION TO HAVE BM ), DOUBLE VISION, ORTHOSTATIC HYPOTENSION   Blood transfusion    Breast cancer (HCC)    right side   Bruises easily    Cataract    Chest pain    a. 12/2012 Cath: nl cors, EF 55-65%.   Complication of anesthesia    reaction to some anesthetics/ 7/12 anesth record on chart- states prefers epidural   CVA (cerebrovascular accident) (HCC) 03/21/2018   Depression    Diverticulosis    Dysrhythmia    HX OF HIGH GRADE HEART BLOCK - REQUIRED PACEMAKER INSERTION   Esophageal stricture    Gastritis    GERD (gastroesophageal reflux disease)    H/O hiatal hernia    Hemorrhoids    Hernia of abdominal wall    spigelian hernia RLQ - SURGERY TO REPAIR   Hypothyroidism    Interstitial cystitis    Latex allergy, contact dermatitis    Mallory - Weiss tear    HEALED    Neuromuscular disorder (HCC)    central nervous system neuropathy- seen per Dr MAURICE   Pacemaker     Pericardial effusion    a. 12/2012 following ppm placement;  b. 01/01/2013 Echo: EF 55-60%, small pericardial effusion w/o RV collapse-->No need for tap/window.   PONV (postoperative nausea and vomiting)    pt needs scop patch   Recurrent upper respiratory infection (URI) 1/13- to present   bronchitis following surgery- states improved but still with cough. OV with [facility] Dr Shepard 09/06/11 on chart   Shortness of breath    AT TIMES - BUT MUCH IMPROVED AFTER PACEMAKER WAS REPROGRAMED.   Symptomatic bradycardia    a. 12/2012 s/p MDT dual chamber PPM, ser # [id number]; b. 12/2012 post-op course complicated by pericardial effusinon req lead revision.   Thyroid  disease    Past Surgical History:  Procedure Laterality Date   ABDOMINAL HYSTERECTOMY  1974   BACK SURGERY     cervical fusion 4-5 with plate   BLADDER SUSPENSION     BREAST BIOPSY  2002   NO BLOOD PRESSURES ON RIGHT SIDE/   s/p  axillary node dissection   CYSTO WITH HYDRODISTENSION  09/07/2011   Procedure: CYSTOSCOPY/HYDRODISTENSION;  Surgeon: Arlena LILLETTE Gal, MD;  Location: [facility];  Service: Urology;  Laterality: N/A;  INSTILLATION  OF MARCAINE /PYRIDIUM  INSTILLATION OF MARCAINE /KENALOG    CYSTOSCOPY  1975, 2007   EYE SURGERY     LASIK EYE SURGERY BILATERAL   LAPAROSCOPY  1973   LEAD REVISION N/A 12/30/2012   Procedure: LEAD REVISION;  Surgeon: Danelle LELON Birmingham, MD;  Location: [facility] CATH LAB;  Service: Cardiovascular;  Laterality: N/A;   LEFT HEART CATHETERIZATION WITH CORONARY ANGIOGRAM N/A 12/29/2012   Procedure: LEFT HEART CATHETERIZATION WITH CORONARY ANGIOGRAM;  Surgeon: Peter M Swaziland, MD;  Location: [facility] CATH LAB;  Service: Cardiovascular;  Laterality: N/A;   MASTECTOMY MODIFIED RADICAL     right; with immediate reconstruction   MYRINGOPLASTY  1962   NECK SURGERY     c4-5 ruptured disc   OOPHORECTOMY  1982   PACEMAKER INSERTION     PERMANENT PACEMAKER INSERTION N/A 12/29/2012   Procedure: PERMANENT PACEMAKER INSERTION;   Surgeon: Elspeth JAYSON Sage, MD;  Location: [facility] CATH LAB;  Service: Cardiovascular;  Laterality: N/A;   PPM GENERATOR CHANGEOUT N/A 02/27/2023   Procedure: PPM GENERATOR CHANGEOUT;  Surgeon: Sage Elspeth JAYSON, MD;  Location: [facility] INVASIVE CV LAB;  Service: Cardiovascular;  Laterality: N/A;   RECTOCELE REPAIR     with cystocele repair   TONSILLECTOMY     TOTAL HIP ARTHROPLASTY Right    TOTAL HIP ARTHROPLASTY Left 06/12/2013   Procedure: LEFT TOTAL HIP ARTHROPLASTY ANTERIOR APPROACH;  Surgeon: Lonni CINDERELLA Poli, MD;  Location: [facility];  Service: Orthopedics;  Laterality: Left;   TYMPANOPLASTY  1973   right   ULNAR NERVE TRANSPOSITION Right    VENTRAL HERNIA REPAIR  05/30/2011   Procedure: HERNIA REPAIR VENTRAL ADULT;  Surgeon: Sherlean JINNY Laughter, MD;  Location: [facility];  Service: General;  Laterality: Right;  repair right spigelian hernia   Patient Active Problem List   Diagnosis Date Noted   Disorder of peripheral nervous system 12/05/2023   Disease related peripheral neuropathy 12/05/2023   Abnormal weight loss 10/21/2023   Deep vein thrombosis (DVT) of tibial vein of left lower extremity (HCC) 09/20/2023   Left leg swelling 09/19/2023   Urinary bladder disorder 07/01/2023   Insomnia 06/18/2023   Autonomic failure 06/04/2023   Focal neurological deficit 04/09/2023   Depression    Pain in both lower extremities 01/07/2022   Muscle ache 12/27/2021   Left lumbar radiculopathy 11/06/2021   Gait abnormality 11/06/2021   Trochanteric bursitis, left hip 05/04/2021   S/P Nissen fundoplication (without gastrostomy tube) procedure 03/01/2020   Pleuritic chest pain    Heart block AV complete (HCC) 05/19/2019   Chronic right shoulder pain 02/13/2017   Short Achilles tendon (acquired), left ankle 02/13/2017   Dizziness and giddiness 02/16/2016   Monoallelic mutation of CHEK2 gene 02/02/2015   Genetic testing 01/31/2015   Altered mental status 10/25/2014   Occipital neuralgia of  left side    Cerebral infarction (HCC) brainstem s/p IV tPA (not seen on MRI) 10/07/2014   History of permanent cardiac pacemaker placement 10/07/2014   Cephalalgia    Complicated migraine    HLD (hyperlipidemia)    Headache 10/06/2014   Arthritis of left hip 06/12/2013   Status post THR (total hip replacement) 06/12/2013   Constipation 05/25/2013   Orthostatic hypotension 01/27/2013   Mechanical complication of cardiac pacemaker electrode 01/27/2013   Pre-syncope 01/02/2013   Sinus tachycardia 01/01/2013   Second degree heart block 12/27/2012   Hypokalemia 12/27/2012   Hypothyroidism 12/27/2012   Spigelian hernia 04/20/2011   DYSPNEA 05/09/2010   Malignant neoplasm of female breast (HCC)  05/05/2010   Asthma 05/05/2010   Diaphragmatic hernia 05/05/2010   Osteoarthritis 05/05/2010   Disorder of bone and cartilage 05/05/2010    PCP: Shepard Ade, MD   REFERRING PROVIDER: Persons, Ronal Dragon, [place]   REFERRING DIAG: Ref dx- 6130122626 (ICD-10-CM) - Pain of left hip  THERAPY DIAG:  Pain in left hip  Muscle weakness (generalized)  Unsteadiness on feet  Other abnormalities of gait and mobility  Rationale for Evaluation and Treatment: Rehabilitation  ONSET DATE: 2-3 weeks ago  SUBJECTIVE:   SUBJECTIVE STATEMENT: Pt reports left hip was feeling a little less painful after IE and now slowly increasing. PERTINENT HISTORY: From referring MD visit--Plan: Findings consistent with trochanteric bursitis.  With regards to her back issues she knows she can go back and see Dr. Colon but right now she is in the middle of an extensive neurology workup and expects to be going to [place] or the [facility].  Cannot do an injection into her trochanteric bursa today because she is not supposed to have any steroids in her body.  We talked about topical medication.  Will also refer her to physical therapy she is at a higher risk because of her orthostatic hypotension and multiple falls.   Hopefully they can do some modalities that will be helpful for her with regards to her left hip replacement looks to be in good position without evidence of loosening   PAIN:  Are you having pain? Yes: NPRS scale: 6/10  Pain location: L lateral  Pain description: dull, sharp Aggravating factors: turning in bed, walking Relieving factors: medication  PRECAUTION:    *Orthostatic hypotension  RED FLAGS:     WEIGHT BEARING RESTRICTIONS: No  FALLS:  Has patient fallen in last 6 months? Falls around Nov-Dec  LIVING ENVIRONMENT: Lives with: lives with their spouse Lives in: House/apartment Stairs: No Has following equipment at home: Environmental consultant - 2 wheeled  OCCUPATION: retired  PLOF: Needs assistance with ADLs  PATIENT GOALS: Decrease hip pain  NEXT MD VISIT: Unknown  OBJECTIVE:  Note: Objective measures were completed at Evaluation unless otherwise noted.  DIAGNOSTIC FINDINGS: xray of hip shows no problems with prosthesis  PATIENT SURVEYS:  PSFS: THE PATIENT SPECIFIC FUNCTIONAL SCALE  Place score of 0-10 (0 = unable to perform activity and 10 = able to perform activity at the same level as before injury or problem)  Activity Date: 12/05/23    walking 4    2.dressing 7    3.transfers 5    4.      Total Score 5.33      Total Score = Sum of activity scores/number of activities  Minimally Detectable Change: 3 points (for single activity); 2 points (for average score)  [facility] (nd). The Patient Specific Functional Scale . Retrieved from [email or web address]   COGNITION: Overall cognitive status: Within functional limits for tasks assessed     SENSATION: WFL  MUSCLE LENGTH: Hamstrings:  Left 50 deg   POSTURE: rounded shoulders, forward head, decreased lumbar lordosis, and flexed trunk   PALPATION: 3T tenderness at L GT bursa  LOWER EXTREMITY ROM:  Active ROM Right eval Left eval  Hip  flexion  25  Hip extension    Hip abduction  40  Hip adduction    Hip internal rotation    Hip external rotation    Knee flexion    Knee extension    Ankle dorsiflexion    Ankle plantarflexion    Ankle inversion    Ankle  eversion     (Blank rows = not tested)  LOWER EXTREMITY MMT:  MMT Right eval Left eval  Hip flexion  2+  Hip extension    Hip abduction  2+  Hip adduction  4  Hip internal rotation    Hip external rotation    Knee flexion    Knee extension    Ankle dorsiflexion  4  Ankle plantarflexion    Ankle inversion    Ankle eversion     (Blank rows = not tested)  LOWER EXTREMITY SPECIAL TESTS:  Hip special tests: Belvie (FABER) test: negative and Piriformis test: negative  FUNCTIONAL TESTS:  5 times sit to stand: Unable 2 completed in 50 sec  GAIT: Distance walked: household Assistive device utilized: Environmental consultant - 2 wheeled Level of assistance: SBA Comments:                                                                                                                                 TREATMENT DATE:  12/23/23  Seated toe raises 2x10 Seated ball squeezes 10x10 sec PPT 2x10 Bridges  2x10 Supine heel lifts 2x10  Manual LLE stretching and STM    12/05/23  Manual treatment - STM at TFL, manual hip ROM, Manual hamstring stretching Education/self care  PATIENT EDUCATION:  Education details: Education in transfer and ambulation tips to decrease irritating the hip  Person educated: Patient and Spouse Education method: Explanation, Demonstration, Tactile cues, and Verbal cues Education comprehension: verbalized understanding and returned demonstration  HOME EXERCISE PROGRAM: Next visit  ASSESSMENT:  CLINICAL IMPRESSION: Pt needed SBA from sit to supine to sit.  Also taught log roll technique.  Improved ambulation with RW.  Pt required increased rest breaks due to low endurance.  Eval-Patient is a 78 y.o. female  presenting to PT with c/o left  lateral hip pain.  After consult with MD and Xray she was referred to PT for trochanteric bursitis.  She presents with decreased ROM, decreased strength, altered gait and pain. She will benefit from skilled PT to address these deficits and return to previous LOA. OBJECTIVE IMPAIRMENTS: Abnormal gait, decreased balance, decreased coordination, decreased mobility, difficulty walking, decreased ROM, decreased strength, dizziness, increased fascial restrictions, impaired flexibility, postural dysfunction, and pain.   ACTIVITY LIMITATIONS: sitting, standing, squatting, transfers, bed mobility, and dressing  PARTICIPATION LIMITATIONS: community activity  PERSONAL FACTORS: Age and 3+ comorbidities:   are also affecting patient's functional outcome.   REHAB POTENTIAL: Fair See above  CLINICAL DECISION MAKING: Stable/uncomplicated  EVALUATION COMPLEXITY: Moderate   GOALS: Goals reviewed with patient? Yes  SHORT TERM GOALS: Target date: 01/16/2024   Pt to be independent with HEP. Baseline: Goal status: INITIAL  2.  Decrease pain in left hip by 2 levels. Baseline:  Goal status: INITIAL  LONG TERM GOALS: Target date: 02/27/2024    Decrease tenderness to 1+ at left GT Baseline:  Goal status: INITIAL  2.  Increase strength by 1/2  to 1 full grade of L LE.   Baseline: 2+/5 Goal status: INITIAL  3.  Increase hamstring flexibility by 50%. Baseline:  Goal status: INITIAL  4.  Ease pain to <5/10 in L hip with all activities. Baseline: 8/10 Goal status: INITIAL  5.  Improve PSFS score by at least 2 levels before discharge. Baseline: 5.33 Goal status: INITIAL   PLAN:  PT FREQUENCY: 1-2x/week  PT DURATION: 12 weeks  PLANNED INTERVENTIONS: 97164- PT Re-evaluation, 97110-Therapeutic exercises, 97530- Therapeutic activity, [id number]- Neuromuscular re-education, 97535- Self Care, [id number]- Manual therapy, [phone]- Gait training, Patient/Family education, Joint mobilization, Cryotherapy, and  Moist heat  PLAN FOR NEXT SESSION: Start on plinth exercises with bolster under head.     Burnard CHRISTELLA Meth, PT 12/23/2023, 4:10 PM

## 2023-12-23 ENCOUNTER — Ambulatory Visit

## 2023-12-23 ENCOUNTER — Ambulatory Visit: Payer: Medicare Other

## 2023-12-23 DIAGNOSIS — M25552 Pain in left hip: Secondary | ICD-10-CM | POA: Diagnosis not present

## 2023-12-23 DIAGNOSIS — R2689 Other abnormalities of gait and mobility: Secondary | ICD-10-CM

## 2023-12-23 DIAGNOSIS — R2681 Unsteadiness on feet: Secondary | ICD-10-CM

## 2023-12-23 DIAGNOSIS — K52831 Collagenous colitis: Secondary | ICD-10-CM | POA: Diagnosis not present

## 2023-12-23 DIAGNOSIS — M6281 Muscle weakness (generalized): Secondary | ICD-10-CM | POA: Diagnosis not present

## 2023-12-23 NOTE — Telephone Encounter (Signed)
 Pt husband called in regards to wanting  to know if MD found new location for PT WIFE TO GO since [facility] is not taking new PT at this time .

## 2023-12-25 DIAGNOSIS — Z1339 Encounter for screening examination for other mental health and behavioral disorders: Secondary | ICD-10-CM | POA: Diagnosis not present

## 2023-12-25 DIAGNOSIS — G43919 Migraine, unspecified, intractable, without status migrainosus: Secondary | ICD-10-CM | POA: Diagnosis not present

## 2023-12-25 DIAGNOSIS — G9089 Other disorders of autonomic nervous system: Secondary | ICD-10-CM | POA: Diagnosis not present

## 2023-12-25 DIAGNOSIS — I480 Paroxysmal atrial fibrillation: Secondary | ICD-10-CM | POA: Diagnosis not present

## 2023-12-25 DIAGNOSIS — E039 Hypothyroidism, unspecified: Secondary | ICD-10-CM | POA: Diagnosis not present

## 2023-12-25 DIAGNOSIS — Z1331 Encounter for screening for depression: Secondary | ICD-10-CM | POA: Diagnosis not present

## 2023-12-25 DIAGNOSIS — D6869 Other thrombophilia: Secondary | ICD-10-CM | POA: Diagnosis not present

## 2023-12-25 DIAGNOSIS — I951 Orthostatic hypotension: Secondary | ICD-10-CM | POA: Diagnosis not present

## 2023-12-25 DIAGNOSIS — F32A Depression, unspecified: Secondary | ICD-10-CM | POA: Diagnosis not present

## 2023-12-25 DIAGNOSIS — G909 Disorder of the autonomic nervous system, unspecified: Secondary | ICD-10-CM | POA: Diagnosis not present

## 2023-12-25 DIAGNOSIS — I700 Atherosclerosis of aorta: Secondary | ICD-10-CM | POA: Diagnosis not present

## 2023-12-25 DIAGNOSIS — Z0000 Encounter for general adult medical examination without abnormal findings: Secondary | ICD-10-CM | POA: Diagnosis not present

## 2023-12-25 NOTE — Telephone Encounter (Signed)
 Referral for Neurology faxed to [facility]   [facility]  Phone: [phone] Fax: [phone]

## 2023-12-25 NOTE — Telephone Encounter (Signed)
 Autonomic Clinic and testing are preformed at [address] [place], [place] [address] Neurophysiology Lab: [phone] ext. [phone]   No orders of the defined types were placed in this encounter.

## 2023-12-25 NOTE — Addendum Note (Signed)
 Addended by: Kyana Aicher on: 12/25/2023 08:56 AM   Modules accepted: Orders

## 2023-12-26 ENCOUNTER — Ambulatory Visit (INDEPENDENT_AMBULATORY_CARE_PROVIDER_SITE_OTHER)

## 2023-12-26 DIAGNOSIS — M25552 Pain in left hip: Secondary | ICD-10-CM

## 2023-12-26 DIAGNOSIS — R2689 Other abnormalities of gait and mobility: Secondary | ICD-10-CM | POA: Diagnosis not present

## 2023-12-26 DIAGNOSIS — M6281 Muscle weakness (generalized): Secondary | ICD-10-CM

## 2023-12-26 DIAGNOSIS — R2681 Unsteadiness on feet: Secondary | ICD-10-CM

## 2023-12-26 NOTE — Therapy (Signed)
 OUTPATIENT PHYSICAL THERAPY LOWER EXTREMITY TREATMENT  Patient Name: [name] MRN: [id number] DOB:1945-11-19, 78 y.o., female Today's Date: 12/26/2023  END OF SESSION:   PT End of Session - 12/26/23 1611     Visit Number 3    Number of Visits 25    Date for PT Re-Evaluation 02/27/24    PT Start Time 1515    PT Stop Time 1555    PT Time Calculation (min) 40 min    Activity Tolerance Patient tolerated treatment well    Behavior During Therapy WFL for tasks assessed/performed            Past Medical History:  Diagnosis Date   ADHD (attention deficit hyperactivity disorder)    Allergy    trees/pollen, mold, fungus, dust mites. Takes allergy shots   Arthritis    PAIN AND OA LEFT HIP   Asthma    allergist Dr Kozlo- monthly allergy injections   Autonomic dysfunction    CENTRAL NERVOUS SYSTEM NEUROPATHY - DX BY DR. MAURICE MORE THAN 10 YRS AGO - AND IT IS FELT TO CONTRIBUTE TO THE AUTOMIC  DYSFUNCTION-- PT HAS NUMBNESS LEGS AND FEET AND SOMETIIMES TIPS OF FINGER, SEVERE CONSTIPATION( NO SENSATION TO HAVE BM ), DOUBLE VISION, ORTHOSTATIC HYPOTENSION   Blood transfusion    Breast cancer (HCC)    right side   Bruises easily    Cataract    Chest pain    a. 12/2012 Cath: nl cors, EF 55-65%.   Complication of anesthesia    reaction to some anesthetics/ 7/12 anesth record on chart- states prefers epidural   CVA (cerebrovascular accident) (HCC) 03/21/2018   Depression    Diverticulosis    Dysrhythmia    HX OF HIGH GRADE HEART BLOCK - REQUIRED PACEMAKER INSERTION   Esophageal stricture    Gastritis    GERD (gastroesophageal reflux disease)    H/O hiatal hernia    Hemorrhoids    Hernia of abdominal wall    spigelian hernia RLQ - SURGERY TO REPAIR   Hypothyroidism    Interstitial cystitis    Latex allergy, contact dermatitis    Mallory - Weiss tear    HEALED    Neuromuscular disorder (HCC)    central nervous system neuropathy- seen per Dr MAURICE   Pacemaker     Pericardial effusion    a. 12/2012 following ppm placement;  b. 01/01/2013 Echo: EF 55-60%, small pericardial effusion w/o RV collapse-->No need for tap/window.   PONV (postoperative nausea and vomiting)    pt needs scop patch   Recurrent upper respiratory infection (URI) 1/13- to present   bronchitis following surgery- states improved but still with cough. OV with [facility] Dr Shepard 09/06/11 on chart   Shortness of breath    AT TIMES - BUT MUCH IMPROVED AFTER PACEMAKER WAS REPROGRAMED.   Symptomatic bradycardia    a. 12/2012 s/p MDT dual chamber PPM, ser # [id number]; b. 12/2012 post-op course complicated by pericardial effusinon req lead revision.   Thyroid  disease    Past Surgical History:  Procedure Laterality Date   ABDOMINAL HYSTERECTOMY  1974   BACK SURGERY     cervical fusion 4-5 with plate   BLADDER SUSPENSION     BREAST BIOPSY  2002   NO BLOOD PRESSURES ON RIGHT SIDE/   s/p  axillary node dissection   CYSTO WITH HYDRODISTENSION  09/07/2011   Procedure: CYSTOSCOPY/HYDRODISTENSION;  Surgeon: Arlena LILLETTE Gal, MD;  Location: [facility];  Service: Urology;  Laterality: N/A;  INSTILLATION  OF MARCAINE /PYRIDIUM  INSTILLATION OF MARCAINE /KENALOG    CYSTOSCOPY  1975, 2007   EYE SURGERY     LASIK EYE SURGERY BILATERAL   LAPAROSCOPY  1973   LEAD REVISION N/A 12/30/2012   Procedure: LEAD REVISION;  Surgeon: Danelle LELON Birmingham, MD;  Location: [facility] CATH LAB;  Service: Cardiovascular;  Laterality: N/A;   LEFT HEART CATHETERIZATION WITH CORONARY ANGIOGRAM N/A 12/29/2012   Procedure: LEFT HEART CATHETERIZATION WITH CORONARY ANGIOGRAM;  Surgeon: Peter M Swaziland, MD;  Location: [facility] CATH LAB;  Service: Cardiovascular;  Laterality: N/A;   MASTECTOMY MODIFIED RADICAL     right; with immediate reconstruction   MYRINGOPLASTY  1962   NECK SURGERY     c4-5 ruptured disc   OOPHORECTOMY  1982   PACEMAKER INSERTION     PERMANENT PACEMAKER INSERTION N/A 12/29/2012   Procedure: PERMANENT PACEMAKER INSERTION;   Surgeon: Elspeth JAYSON Sage, MD;  Location: [facility] CATH LAB;  Service: Cardiovascular;  Laterality: N/A;   PPM GENERATOR CHANGEOUT N/A 02/27/2023   Procedure: PPM GENERATOR CHANGEOUT;  Surgeon: Sage Elspeth JAYSON, MD;  Location: [facility] INVASIVE CV LAB;  Service: Cardiovascular;  Laterality: N/A;   RECTOCELE REPAIR     with cystocele repair   TONSILLECTOMY     TOTAL HIP ARTHROPLASTY Right    TOTAL HIP ARTHROPLASTY Left 06/12/2013   Procedure: LEFT TOTAL HIP ARTHROPLASTY ANTERIOR APPROACH;  Surgeon: Lonni CINDERELLA Poli, MD;  Location: [facility];  Service: Orthopedics;  Laterality: Left;   TYMPANOPLASTY  1973   right   ULNAR NERVE TRANSPOSITION Right    VENTRAL HERNIA REPAIR  05/30/2011   Procedure: HERNIA REPAIR VENTRAL ADULT;  Surgeon: Sherlean JINNY Laughter, MD;  Location:  SURGERY CENTER;  Service: General;  Laterality: Right;  repair right spigelian hernia   Patient Active Problem List   Diagnosis Date Noted   Disorder of peripheral nervous system 12/05/2023   Disease related peripheral neuropathy 12/05/2023   Abnormal weight loss 10/21/2023   Deep vein thrombosis (DVT) of tibial vein of left lower extremity (HCC) 09/20/2023   Left leg swelling 09/19/2023   Urinary bladder disorder 07/01/2023   Insomnia 06/18/2023   Autonomic failure 06/04/2023   Focal neurological deficit 04/09/2023   Depression    Pain in both lower extremities 01/07/2022   Muscle ache 12/27/2021   Left lumbar radiculopathy 11/06/2021   Gait abnormality 11/06/2021   Trochanteric bursitis, left hip 05/04/2021   S/P Nissen fundoplication (without gastrostomy tube) procedure 03/01/2020   Pleuritic chest pain    Heart block AV complete (HCC) 05/19/2019   Chronic right shoulder pain 02/13/2017   Short Achilles tendon (acquired), left ankle 02/13/2017   Dizziness and giddiness 02/16/2016   Monoallelic mutation of CHEK2 gene 02/02/2015   Genetic testing 01/31/2015   Altered mental status 10/25/2014   Occipital neuralgia of  left side    Cerebral infarction (HCC) brainstem s/p IV tPA (not seen on MRI) 10/07/2014   History of permanent cardiac pacemaker placement 10/07/2014   Cephalalgia    Complicated migraine    HLD (hyperlipidemia)    Headache 10/06/2014   Arthritis of left hip 06/12/2013   Status post THR (total hip replacement) 06/12/2013   Constipation 05/25/2013   Orthostatic hypotension 01/27/2013   Mechanical complication of cardiac pacemaker electrode 01/27/2013   Pre-syncope 01/02/2013   Sinus tachycardia 01/01/2013   Second degree heart block 12/27/2012   Hypokalemia 12/27/2012   Hypothyroidism 12/27/2012   Spigelian hernia 04/20/2011   DYSPNEA 05/09/2010   Malignant neoplasm of female breast (HCC)  05/05/2010   Asthma 05/05/2010   Diaphragmatic hernia 05/05/2010   Osteoarthritis 05/05/2010   Disorder of bone and cartilage 05/05/2010    PCP: Shepard Ade, MD   REFERRING PROVIDER: Persons, Ronal Dragon, [place]   REFERRING DIAG: Ref dx- [phone] (ICD-10-CM) - Pain of left hip  THERAPY DIAG:  Pain in left hip  Muscle weakness (generalized)  Unsteadiness on feet  Other abnormalities of gait and mobility  Rationale for Evaluation and Treatment: Rehabilitation  ONSET DATE: 2-3 weeks ago  SUBJECTIVE:   SUBJECTIVE STATEMENT: Pt states she felt some soreness after last visit.  States she is sad because of a family death.  Wants to keep moving and feels minimal hip pain today. PERTINENT HISTORY: From referring MD visit--Plan: Findings consistent with trochanteric bursitis.  With regards to her back issues she knows she can go back and see Dr. Colon but right now she is in the middle of an extensive neurology workup and expects to be going to [place] or the [place] [facility].  Cannot do an injection into her trochanteric bursa today because she is not supposed to have any steroids in her body.  We talked about topical medication.  Will also refer her to physical therapy she is at a  higher risk because of her orthostatic hypotension and multiple falls.  Hopefully they can do some modalities that will be helpful for her with regards to her left hip replacement looks to be in good position without evidence of loosening   PAIN:  Are you having pain? Yes: NPRS scale: 4/10  Pain location: L lateral  Pain description: dull, sharp Aggravating factors: turning in bed, walking Relieving factors: medication  PRECAUTION:    *Orthostatic hypotension  RED FLAGS:     WEIGHT BEARING RESTRICTIONS: No  FALLS:  Has patient fallen in last 6 months? Falls around Nov-Dec  LIVING ENVIRONMENT: Lives with: lives with their spouse Lives in: House/apartment Stairs: No Has following equipment at home: Environmental consultant - 2 wheeled  OCCUPATION: retired  PLOF: Needs assistance with ADLs  PATIENT GOALS: Decrease hip pain  NEXT MD VISIT: Unknown  OBJECTIVE:  Note: Objective measures were completed at Evaluation unless otherwise noted.  DIAGNOSTIC FINDINGS: xray of hip shows no problems with prosthesis  PATIENT SURVEYS:  PSFS: THE PATIENT SPECIFIC FUNCTIONAL SCALE  Place score of 0-10 (0 = unable to perform activity and 10 = able to perform activity at the same level as before injury or problem)  Activity Date: 12/05/23    walking 4    2.dressing 7    3.transfers 5    4.      Total Score 5.33      Total Score = Sum of activity scores/number of activities  Minimally Detectable Change: 3 points (for single activity); 2 points (for average score)  [facility] (nd). The Patient Specific Functional Scale . Retrieved from [email or web address]   COGNITION: Overall cognitive status: Within functional limits for tasks assessed     SENSATION: WFL  MUSCLE LENGTH: Hamstrings:  Left 50 deg   POSTURE: rounded shoulders, forward head, decreased lumbar lordosis, and flexed trunk   PALPATION: 3T tenderness at L GT  bursa  LOWER EXTREMITY ROM:  Active ROM Right eval Left eval  Hip flexion  25  Hip extension    Hip abduction  40  Hip adduction    Hip internal rotation    Hip external rotation    Knee flexion    Knee extension    Ankle dorsiflexion  Ankle plantarflexion    Ankle inversion    Ankle eversion     (Blank rows = not tested)  LOWER EXTREMITY MMT:  MMT Right eval Left eval  Hip flexion  2+  Hip extension    Hip abduction  2+  Hip adduction  4  Hip internal rotation    Hip external rotation    Knee flexion    Knee extension    Ankle dorsiflexion  4  Ankle plantarflexion    Ankle inversion    Ankle eversion     (Blank rows = not tested)  LOWER EXTREMITY SPECIAL TESTS:  Hip special tests: Belvie (FABER) test: negative and Piriformis test: negative  FUNCTIONAL TESTS:  5 times sit to stand: Unable 2 completed in 50 sec  GAIT: Distance walked: household Assistive device utilized: Environmental consultant - 2 wheeled Level of assistance: SBA Comments:                                                                                                                                 TREATMENT DATE:  12/26/23 Seated toe raises 2x10 Seated ball squeezes 10x10 sec PPT 2x10 Bridges  2x10 Supine heel lifts 2x10  Manual LLE stretching and STM        12/23/23  Seated toe raises 2x10 Seated ball squeezes 10x10 sec PPT 2x10 Bridges  2x10 Supine heel lifts 2x10  Manual LLE stretching and STM    12/05/23  Manual treatment - STM at TFL, manual hip ROM, Manual hamstring stretching Education/self care  PATIENT EDUCATION:  Education details: Education in transfer and ambulation tips to decrease irritating the hip  Person educated: Patient and Spouse Education method: Programmer, multimedia, Demonstration, Tactile cues, and Verbal cues Education comprehension: verbalized understanding and returned demonstration  HOME EXERCISE PROGRAM: Next visit  ASSESSMENT:  CLINICAL IMPRESSION: Pt  continued to need frequent breaks between sets due to low endurance but able to complete session.  Eval-Patient is a 78 y.o. female  presenting to PT with c/o left lateral hip pain.  After consult with MD and Xray she was referred to PT for trochanteric bursitis.  She presents with decreased ROM, decreased strength, altered gait and pain. She will benefit from skilled PT to address these deficits and return to previous LOA. OBJECTIVE IMPAIRMENTS: Abnormal gait, decreased balance, decreased coordination, decreased mobility, difficulty walking, decreased ROM, decreased strength, dizziness, increased fascial restrictions, impaired flexibility, postural dysfunction, and pain.   ACTIVITY LIMITATIONS: sitting, standing, squatting, transfers, bed mobility, and dressing  PARTICIPATION LIMITATIONS: community activity  PERSONAL FACTORS: Age and 3+ comorbidities:   are also affecting patient's functional outcome.   REHAB POTENTIAL: Fair See above  CLINICAL DECISION MAKING: Stable/uncomplicated  EVALUATION COMPLEXITY: Moderate   GOALS: Goals reviewed with patient? Yes  SHORT TERM GOALS: Target date: 01/16/2024   Pt to be independent with HEP. Baseline: Goal status: INITIAL  2.  Decrease pain in left hip by 2 levels. Baseline:  Goal status:  INITIAL  LONG TERM GOALS: Target date: 02/27/2024    Decrease tenderness to 1+ at left GT Baseline:  Goal status: INITIAL  2.  Increase strength by 1/2 to 1 full grade of L LE.   Baseline: 2+/5 Goal status: INITIAL  3.  Increase hamstring flexibility by 50%. Baseline:  Goal status: INITIAL  4.  Ease pain to <5/10 in L hip with all activities. Baseline: 8/10 Goal status: INITIAL  5.  Improve PSFS score by at least 2 levels before discharge. Baseline: 5.33 Goal status: INITIAL   PLAN:  PT FREQUENCY: 1-2x/week  PT DURATION: 12 weeks  PLANNED INTERVENTIONS: [id number]- PT Re-evaluation, 97110-Therapeutic exercises, 97530- Therapeutic activity,  [id number]- Neuromuscular re-education, 97535- Self Care, [id number]- Manual therapy, [phone]- Gait training, Patient/Family education, Joint mobilization, Cryotherapy, and Moist heat  PLAN FOR NEXT SESSION: Progress as tolerated.    Burnard CHRISTELLA Meth, PT 12/26/2023, 4:12 PM

## 2023-12-30 NOTE — Therapy (Signed)
 OUTPATIENT PHYSICAL THERAPY LOWER EXTREMITY TREATMENT  Patient Name: [name] MRN: [id number] DOB:[birth year], 78 y.o., female Today's Date: 12/31/2023  END OF SESSION:   PT End of Session - 12/31/23 1602     Visit Number 4    Number of Visits 25    Date for PT Re-Evaluation 02/27/24    PT Start Time 1515    PT Stop Time 1553    PT Time Calculation (min) 38 min    Activity Tolerance Patient tolerated treatment well    Behavior During Therapy WFL for tasks assessed/performed             Past Medical History:  Diagnosis Date   ADHD (attention deficit hyperactivity disorder)    Allergy    trees/pollen, mold, fungus, dust mites. Takes allergy shots   Arthritis    PAIN AND OA LEFT HIP   Asthma    allergist Dr Kozlo- monthly allergy injections   Autonomic dysfunction    CENTRAL NERVOUS SYSTEM NEUROPATHY - DX BY DR. MAURICE MORE THAN 10 YRS AGO - AND IT IS FELT TO CONTRIBUTE TO THE AUTOMIC  DYSFUNCTION-- PT HAS NUMBNESS LEGS AND FEET AND SOMETIIMES TIPS OF FINGER, SEVERE CONSTIPATION( NO SENSATION TO HAVE BM ), DOUBLE VISION, ORTHOSTATIC HYPOTENSION   Blood transfusion    Breast cancer (HCC)    right side   Bruises easily    Cataract    Chest pain    a. 12/2012 Cath: nl cors, EF 55-65%.   Complication of anesthesia    reaction to some anesthetics/ 7/12 anesth record on chart- states prefers epidural   CVA (cerebrovascular accident) (HCC) 03/21/2018   Depression    Diverticulosis    Dysrhythmia    HX OF HIGH GRADE HEART BLOCK - REQUIRED PACEMAKER INSERTION   Esophageal stricture    Gastritis    GERD (gastroesophageal reflux disease)    H/O hiatal hernia    Hemorrhoids    Hernia of abdominal wall    spigelian hernia RLQ - SURGERY TO REPAIR   Hypothyroidism    Interstitial cystitis    Latex allergy, contact dermatitis    Mallory - Weiss tear    HEALED    Neuromuscular disorder (HCC)    central nervous system neuropathy- seen per Dr MAURICE   Pacemaker     Pericardial effusion    a. 12/2012 following ppm placement;  b. 01/01/2013 Echo: EF 55-60%, small pericardial effusion w/o RV collapse-->No need for tap/window.   PONV (postoperative nausea and vomiting)    pt needs scop patch   Recurrent upper respiratory infection (URI) 1/13- to present   bronchitis following surgery- states improved but still with cough. OV with [facility] Dr Shepard 09/06/11 on chart   Shortness of breath    AT TIMES - BUT MUCH IMPROVED AFTER PACEMAKER WAS REPROGRAMED.   Symptomatic bradycardia    a. 12/2012 s/p MDT dual chamber PPM, ser # [id number]; b. 12/2012 post-op course complicated by pericardial effusinon req lead revision.   Thyroid  disease    Past Surgical History:  Procedure Laterality Date   ABDOMINAL HYSTERECTOMY  1974   BACK SURGERY     cervical fusion 4-5 with plate   BLADDER SUSPENSION     BREAST BIOPSY  2002   NO BLOOD PRESSURES ON RIGHT SIDE/   s/p  axillary node dissection   CYSTO WITH HYDRODISTENSION  09/07/2011   Procedure: CYSTOSCOPY/HYDRODISTENSION;  Surgeon: Arlena LILLETTE Gal, MD;  Location: [facility];  Service: Urology;  Laterality: N/A;  INSTILLATION OF MARCAINE /PYRIDIUM  INSTILLATION OF MARCAINE /KENALOG    CYSTOSCOPY  1975, 2007   EYE SURGERY     LASIK EYE SURGERY BILATERAL   LAPAROSCOPY  1973   LEAD REVISION N/A 12/30/2012   Procedure: LEAD REVISION;  Surgeon: Danelle LELON Birmingham, MD;  Location: [facility] CATH LAB;  Service: Cardiovascular;  Laterality: N/A;   LEFT HEART CATHETERIZATION WITH CORONARY ANGIOGRAM N/A 12/29/2012   Procedure: LEFT HEART CATHETERIZATION WITH CORONARY ANGIOGRAM;  Surgeon: Peter M Swaziland, MD;  Location: [facility] CATH LAB;  Service: Cardiovascular;  Laterality: N/A;   MASTECTOMY MODIFIED RADICAL     right; with immediate reconstruction   MYRINGOPLASTY  1962   NECK SURGERY     c4-5 ruptured disc   OOPHORECTOMY  1982   PACEMAKER INSERTION     PERMANENT PACEMAKER INSERTION N/A 12/29/2012   Procedure: PERMANENT PACEMAKER INSERTION;   Surgeon: Elspeth JAYSON Sage, MD;  Location: [facility] CATH LAB;  Service: Cardiovascular;  Laterality: N/A;   PPM GENERATOR CHANGEOUT N/A 02/27/2023   Procedure: PPM GENERATOR CHANGEOUT;  Surgeon: Sage Elspeth JAYSON, MD;  Location: [facility] INVASIVE CV LAB;  Service: Cardiovascular;  Laterality: N/A;   RECTOCELE REPAIR     with cystocele repair   TONSILLECTOMY     TOTAL HIP ARTHROPLASTY Right    TOTAL HIP ARTHROPLASTY Left 06/12/2013   Procedure: LEFT TOTAL HIP ARTHROPLASTY ANTERIOR APPROACH;  Surgeon: Lonni CINDERELLA Poli, MD;  Location: [facility];  Service: Orthopedics;  Laterality: Left;   TYMPANOPLASTY  1973   right   ULNAR NERVE TRANSPOSITION Right    VENTRAL HERNIA REPAIR  05/30/2011   Procedure: HERNIA REPAIR VENTRAL ADULT;  Surgeon: Sherlean JINNY Laughter, MD;  Location: [facility];  Service: General;  Laterality: Right;  repair right spigelian hernia   Patient Active Problem List   Diagnosis Date Noted   Disorder of peripheral nervous system 12/05/2023   Disease related peripheral neuropathy 12/05/2023   Abnormal weight loss 10/21/2023   Deep vein thrombosis (DVT) of tibial vein of left lower extremity (HCC) 09/20/2023   Left leg swelling 09/19/2023   Urinary bladder disorder 07/01/2023   Insomnia 06/18/2023   Autonomic failure 06/04/2023   Focal neurological deficit 04/09/2023   Depression    Pain in both lower extremities 01/07/2022   Muscle ache 12/27/2021   Left lumbar radiculopathy 11/06/2021   Gait abnormality 11/06/2021   Trochanteric bursitis, left hip 05/04/2021   S/P Nissen fundoplication (without gastrostomy tube) procedure 03/01/2020   Pleuritic chest pain    Heart block AV complete (HCC) 05/19/2019   Chronic right shoulder pain 02/13/2017   Short Achilles tendon (acquired), left ankle 02/13/2017   Dizziness and giddiness 02/16/2016   Monoallelic mutation of CHEK2 gene 02/02/2015   Genetic testing 01/31/2015   Altered mental status 10/25/2014   Occipital neuralgia of  left side    Cerebral infarction (HCC) brainstem s/p IV tPA (not seen on MRI) 10/07/2014   History of permanent cardiac pacemaker placement 10/07/2014   Cephalalgia    Complicated migraine    HLD (hyperlipidemia)    Headache 10/06/2014   Arthritis of left hip 06/12/2013   Status post THR (total hip replacement) 06/12/2013   Constipation 05/25/2013   Orthostatic hypotension 01/27/2013   Mechanical complication of cardiac pacemaker electrode 01/27/2013   Pre-syncope 01/02/2013   Sinus tachycardia 01/01/2013   Second degree heart block 12/27/2012   Hypokalemia 12/27/2012   Hypothyroidism 12/27/2012   Spigelian hernia 04/20/2011   DYSPNEA 05/09/2010   Malignant neoplasm of female breast (  HCC) 05/05/2010   Asthma 05/05/2010   Diaphragmatic hernia 05/05/2010   Osteoarthritis 05/05/2010   Disorder of bone and cartilage 05/05/2010    PCP: Shepard Ade, MD   REFERRING PROVIDER: Persons, Ronal Dragon, [place]   REFERRING DIAG: Ref dx- [phone] (ICD-10-CM) - Pain of left hip  THERAPY DIAG:  Pain in left hip  Muscle weakness (generalized)  Unsteadiness on feet  Other abnormalities of gait and mobility  Rationale for Evaluation and Treatment: Rehabilitation  ONSET DATE: 2-3 weeks ago  SUBJECTIVE:   SUBJECTIVE STATEMENT: Pt states she has not noticed less pain in the L hip with motions.  PERTINENT HISTORY: From referring MD visit--Plan: Findings consistent with trochanteric bursitis.  With regards to her back issues she knows she can go back and see Dr. Colon but right now she is in the middle of an extensive neurology workup and expects to be going to [place] or the [place] clinic.  Cannot do an injection into her trochanteric bursa today because she is not supposed to have any steroids in her body.  We talked about topical medication.  Will also refer her to physical therapy she is at a higher risk because of her orthostatic hypotension and multiple falls.  Hopefully they can  do some modalities that will be helpful for her with regards to her left hip replacement looks to be in good position without evidence of loosening   PAIN:  Are you having pain? Yes: NPRS scale: 3/10  Pain location: L lateral  Pain description: dull, sharp Aggravating factors: turning in bed, walking Relieving factors: medication  PRECAUTION:    *Orthostatic hypotension  RED FLAGS:     WEIGHT BEARING RESTRICTIONS: No  FALLS:  Has patient fallen in last 6 months? Falls around Nov-Dec  LIVING ENVIRONMENT: Lives with: lives with their spouse Lives in: House/apartment Stairs: No Has following equipment at home: Environmental consultant - 2 wheeled  OCCUPATION: retired  PLOF: Needs assistance with ADLs  PATIENT GOALS: Decrease hip pain  NEXT MD VISIT: Unknown  OBJECTIVE:  Note: Objective measures were completed at Evaluation unless otherwise noted.  DIAGNOSTIC FINDINGS: xray of hip shows no problems with prosthesis  PATIENT SURVEYS:  PSFS: THE PATIENT SPECIFIC FUNCTIONAL SCALE  Place score of 0-10 (0 = unable to perform activity and 10 = able to perform activity at the same level as before injury or problem)  Activity Date: 12/05/23    walking 4    2.dressing 7    3.transfers 5    4.      Total Score 5.33      Total Score = Sum of activity scores/number of activities  Minimally Detectable Change: 3 points (for single activity); 2 points (for average score)  [facility] (nd). The Patient Specific Functional Scale . Retrieved from [email or web address]   COGNITION: Overall cognitive status: Within functional limits for tasks assessed     SENSATION: WFL  MUSCLE LENGTH: Hamstrings:  Left 50 deg   POSTURE: rounded shoulders, forward head, decreased lumbar lordosis, and flexed trunk   PALPATION: 3T tenderness at L GT bursa  LOWER EXTREMITY ROM:  Active ROM Right eval Left eval  Hip flexion  25  Hip  extension    Hip abduction  40  Hip adduction    Hip internal rotation    Hip external rotation    Knee flexion    Knee extension    Ankle dorsiflexion    Ankle plantarflexion    Ankle inversion    Ankle  eversion     (Blank rows = not tested)  LOWER EXTREMITY MMT:  MMT Right eval Left eval  Hip flexion  2+  Hip extension    Hip abduction  2+  Hip adduction  4  Hip internal rotation    Hip external rotation    Knee flexion    Knee extension    Ankle dorsiflexion  4  Ankle plantarflexion    Ankle inversion    Ankle eversion     (Blank rows = not tested)  LOWER EXTREMITY SPECIAL TESTS:  Hip special tests: Belvie (FABER) test: negative and Piriformis test: negative  FUNCTIONAL TESTS:  5 times sit to stand: Unable 2 completed in 50 sec  GAIT: Distance walked: household Assistive device utilized: Environmental consultant - 2 wheeled Level of assistance: SBA Comments:                                                                                                                                 TREATMENT DATE:  12/31/23  [id number] Seated toe raises 2x10 Seated ball squeezes 10x10 sec PPT 2x10 Bridges  2x10 Supine heel lifts 2x10  Manual LLE stretching and STM    12/26/23 Seated toe raises 2x10 Seated ball squeezes 10x10 sec PPT 2x10 Bridges  2x10 Supine heel lifts 2x10  Manual LLE stretching and STM  12/23/23  Seated toe raises 2x10 Seated ball squeezes 10x10 sec PPT 2x10 Bridges  2x10 Supine heel lifts 2x10  Manual LLE stretching and STM    12/05/23  Manual treatment - STM at TFL, manual hip ROM, Manual hamstring stretching Education/self care  PATIENT EDUCATION:  Education details: Education in transfer and ambulation tips to decrease irritating the hip  Person educated: Patient and Spouse Education method: Explanation, Demonstration, Tactile cues, and Verbal cues Education comprehension: verbalized understanding and returned demonstration  HOME EXERCISE  PROGRAM: Next visit  ASSESSMENT:  CLINICAL IMPRESSION: Pt demonstrated good carryover technique with PPT.  Reports feeling more mobile after manual stretching.      Eval-Patient is a 78 y.o. female  presenting to PT with c/o left lateral hip pain.  After consult with MD and Xray she was referred to PT for trochanteric bursitis.  She presents with decreased ROM, decreased strength, altered gait and pain. She will benefit from skilled PT to address these deficits and return to previous LOA. OBJECTIVE IMPAIRMENTS: Abnormal gait, decreased balance, decreased coordination, decreased mobility, difficulty walking, decreased ROM, decreased strength, dizziness, increased fascial restrictions, impaired flexibility, postural dysfunction, and pain.   ACTIVITY LIMITATIONS: sitting, standing, squatting, transfers, bed mobility, and dressing  PARTICIPATION LIMITATIONS: community activity  PERSONAL FACTORS: Age and 3+ comorbidities:   are also affecting patient's functional outcome.   REHAB POTENTIAL: Fair See above  CLINICAL DECISION MAKING: Stable/uncomplicated  EVALUATION COMPLEXITY: Moderate   GOALS: Goals reviewed with patient? Yes  SHORT TERM GOALS: Target date: 01/16/2024   Pt to be independent with HEP. Baseline: Goal status: INITIAL  2.  Decrease pain in left hip by 2 levels. Baseline:  Goal status: INITIAL  LONG TERM GOALS: Target date: 02/27/2024    Decrease tenderness to 1+ at left GT Baseline:  Goal status: INITIAL  2.  Increase strength by 1/2 to 1 full grade of L LE.   Baseline: 2+/5 Goal status: INITIAL  3.  Increase hamstring flexibility by 50%. Baseline:  Goal status: INITIAL  4.  Ease pain to <5/10 in L hip with all activities. Baseline: 8/10 Goal status: INITIAL  5.  Improve PSFS score by at least 2 levels before discharge. Baseline: 5.33 Goal status: INITIAL   PLAN:  PT FREQUENCY: 1-2x/week  PT DURATION: 12 weeks  PLANNED INTERVENTIONS: [id number]- PT  Re-evaluation, 97110-Therapeutic exercises, 97530- Therapeutic activity, [id number]- Neuromuscular re-education, 97535- Self Care, [id number]- Manual therapy, [phone]- Gait training, Patient/Family education, Joint mobilization, Cryotherapy, and Moist heat  PLAN FOR NEXT SESSION: Progress as tolerated.    Burnard CHRISTELLA Meth, PT 12/31/2023, 4:03 PM

## 2023-12-31 ENCOUNTER — Ambulatory Visit (INDEPENDENT_AMBULATORY_CARE_PROVIDER_SITE_OTHER)

## 2023-12-31 DIAGNOSIS — M25552 Pain in left hip: Secondary | ICD-10-CM

## 2023-12-31 DIAGNOSIS — R2689 Other abnormalities of gait and mobility: Secondary | ICD-10-CM

## 2023-12-31 DIAGNOSIS — R2681 Unsteadiness on feet: Secondary | ICD-10-CM

## 2023-12-31 DIAGNOSIS — M6281 Muscle weakness (generalized): Secondary | ICD-10-CM | POA: Diagnosis not present

## 2024-01-01 NOTE — Therapy (Signed)
 OUTPATIENT PHYSICAL THERAPY LOWER EXTREMITY TREATMENT  Patient Name: [name] MRN: [id number] DOB:Sep 17, 1945, 78 y.o., female Today's Date: 01/02/2024  END OF SESSION:   PT End of Session - 01/02/24 1517     Visit Number 5    Number of Visits 25    Date for PT Re-Evaluation 02/27/24    PT Start Time 1426    PT Stop Time 1504    PT Time Calculation (min) 38 min    Activity Tolerance Patient tolerated treatment well    Behavior During Therapy WFL for tasks assessed/performed              Past Medical History:  Diagnosis Date   ADHD (attention deficit hyperactivity disorder)    Allergy    trees/pollen, mold, fungus, dust mites. Takes allergy shots   Arthritis    PAIN AND OA LEFT HIP   Asthma    allergist Dr Kozlo- monthly allergy injections   Autonomic dysfunction    CENTRAL NERVOUS SYSTEM NEUROPATHY - DX BY DR. MAURICE MORE THAN 10 YRS AGO - AND IT IS FELT TO CONTRIBUTE TO THE AUTOMIC  DYSFUNCTION-- PT HAS NUMBNESS LEGS AND FEET AND SOMETIIMES TIPS OF FINGER, SEVERE CONSTIPATION( NO SENSATION TO HAVE BM ), DOUBLE VISION, ORTHOSTATIC HYPOTENSION   Blood transfusion    Breast cancer (HCC)    right side   Bruises easily    Cataract    Chest pain    a. 12/2012 Cath: nl cors, EF 55-65%.   Complication of anesthesia    reaction to some anesthetics/ 7/12 anesth record on chart- states prefers epidural   CVA (cerebrovascular accident) (HCC) 03/21/2018   Depression    Diverticulosis    Dysrhythmia    HX OF HIGH GRADE HEART BLOCK - REQUIRED PACEMAKER INSERTION   Esophageal stricture    Gastritis    GERD (gastroesophageal reflux disease)    H/O hiatal hernia    Hemorrhoids    Hernia of abdominal wall    spigelian hernia RLQ - SURGERY TO REPAIR   Hypothyroidism    Interstitial cystitis    Latex allergy, contact dermatitis    Mallory - Weiss tear    HEALED    Neuromuscular disorder (HCC)    central nervous system neuropathy- seen per Dr MAURICE   Pacemaker     Pericardial effusion    a. 12/2012 following ppm placement;  b. 01/01/2013 Echo: EF 55-60%, small pericardial effusion w/o RV collapse-->No need for tap/window.   PONV (postoperative nausea and vomiting)    pt needs scop patch   Recurrent upper respiratory infection (URI) 1/13- to present   bronchitis following surgery- states improved but still with cough. OV with [facility] Dr Shepard 09/06/11 on chart   Shortness of breath    AT TIMES - BUT MUCH IMPROVED AFTER PACEMAKER WAS REPROGRAMED.   Symptomatic bradycardia    a. 12/2012 s/p MDT dual chamber PPM, ser # [id number]; b. 12/2012 post-op course complicated by pericardial effusinon req lead revision.   Thyroid  disease    Past Surgical History:  Procedure Laterality Date   ABDOMINAL HYSTERECTOMY  1974   BACK SURGERY     cervical fusion 4-5 with plate   BLADDER SUSPENSION     BREAST BIOPSY  2002   NO BLOOD PRESSURES ON RIGHT SIDE/   s/p  axillary node dissection   CYSTO WITH HYDRODISTENSION  09/07/2011   Procedure: CYSTOSCOPY/HYDRODISTENSION;  Surgeon: Arlena LILLETTE Gal, MD;  Location: [facility];  Service: Urology;  Laterality: N/A;  INSTILLATION OF MARCAINE /PYRIDIUM  INSTILLATION OF MARCAINE /KENALOG    CYSTOSCOPY  1975, 2007   EYE SURGERY     LASIK EYE SURGERY BILATERAL   LAPAROSCOPY  1973   LEAD REVISION N/A 12/30/2012   Procedure: LEAD REVISION;  Surgeon: Danelle LELON Birmingham, MD;  Location: [facility] CATH LAB;  Service: Cardiovascular;  Laterality: N/A;   LEFT HEART CATHETERIZATION WITH CORONARY ANGIOGRAM N/A 12/29/2012   Procedure: LEFT HEART CATHETERIZATION WITH CORONARY ANGIOGRAM;  Surgeon: Peter M Swaziland, MD;  Location: [facility] CATH LAB;  Service: Cardiovascular;  Laterality: N/A;   MASTECTOMY MODIFIED RADICAL     right; with immediate reconstruction   MYRINGOPLASTY  1962   NECK SURGERY     c4-5 ruptured disc   OOPHORECTOMY  1982   PACEMAKER INSERTION     PERMANENT PACEMAKER INSERTION N/A 12/29/2012   Procedure: PERMANENT PACEMAKER INSERTION;   Surgeon: Elspeth JAYSON Sage, MD;  Location: [facility] CATH LAB;  Service: Cardiovascular;  Laterality: N/A;   PPM GENERATOR CHANGEOUT N/A 02/27/2023   Procedure: PPM GENERATOR CHANGEOUT;  Surgeon: Sage Elspeth JAYSON, MD;  Location: [facility] INVASIVE CV LAB;  Service: Cardiovascular;  Laterality: N/A;   RECTOCELE REPAIR     with cystocele repair   TONSILLECTOMY     TOTAL HIP ARTHROPLASTY Right    TOTAL HIP ARTHROPLASTY Left 06/12/2013   Procedure: LEFT TOTAL HIP ARTHROPLASTY ANTERIOR APPROACH;  Surgeon: Lonni CINDERELLA Poli, MD;  Location: [facility];  Service: Orthopedics;  Laterality: Left;   TYMPANOPLASTY  1973   right   ULNAR NERVE TRANSPOSITION Right    VENTRAL HERNIA REPAIR  05/30/2011   Procedure: HERNIA REPAIR VENTRAL ADULT;  Surgeon: Sherlean JINNY Laughter, MD;  Location: [facility];  Service: General;  Laterality: Right;  repair right spigelian hernia   Patient Active Problem List   Diagnosis Date Noted   Disorder of peripheral nervous system 12/05/2023   Disease related peripheral neuropathy 12/05/2023   Abnormal weight loss 10/21/2023   Deep vein thrombosis (DVT) of tibial vein of left lower extremity (HCC) 09/20/2023   Left leg swelling 09/19/2023   Urinary bladder disorder 07/01/2023   Insomnia 06/18/2023   Autonomic failure 06/04/2023   Focal neurological deficit 04/09/2023   Depression    Pain in both lower extremities 01/07/2022   Muscle ache 12/27/2021   Left lumbar radiculopathy 11/06/2021   Gait abnormality 11/06/2021   Trochanteric bursitis, left hip 05/04/2021   S/P Nissen fundoplication (without gastrostomy tube) procedure 03/01/2020   Pleuritic chest pain    Heart block AV complete (HCC) 05/19/2019   Chronic right shoulder pain 02/13/2017   Short Achilles tendon (acquired), left ankle 02/13/2017   Dizziness and giddiness 02/16/2016   Monoallelic mutation of CHEK2 gene 02/02/2015   Genetic testing 01/31/2015   Altered mental status 10/25/2014   Occipital neuralgia of  left side    Cerebral infarction (HCC) brainstem s/p IV tPA (not seen on MRI) 10/07/2014   History of permanent cardiac pacemaker placement 10/07/2014   Cephalalgia    Complicated migraine    HLD (hyperlipidemia)    Headache 10/06/2014   Arthritis of left hip 06/12/2013   Status post THR (total hip replacement) 06/12/2013   Constipation 05/25/2013   Orthostatic hypotension 01/27/2013   Mechanical complication of cardiac pacemaker electrode 01/27/2013   Pre-syncope 01/02/2013   Sinus tachycardia 01/01/2013   Second degree heart block 12/27/2012   Hypokalemia 12/27/2012   Hypothyroidism 12/27/2012   Spigelian hernia 04/20/2011   DYSPNEA 05/09/2010   Malignant neoplasm of female breast (  HCC) 05/05/2010   Asthma 05/05/2010   Diaphragmatic hernia 05/05/2010   Osteoarthritis 05/05/2010   Disorder of bone and cartilage 05/05/2010    PCP: Shepard Ade, MD   REFERRING PROVIDER: Persons, Ronal Dragon, [place]   REFERRING DIAG: Ref dx- [phone] (ICD-10-CM) - Pain of left hip  THERAPY DIAG:  Pain in left hip  Muscle weakness (generalized)  Unsteadiness on feet  Other abnormalities of gait and mobility  Rationale for Evaluation and Treatment: Rehabilitation  ONSET DATE: 2-3 weeks ago  SUBJECTIVE:   SUBJECTIVE STATEMENT: Pt reports the hip pain can increase her endurance with ambulation. PERTINENT HISTORY: From referring MD visit--Plan: Findings consistent with trochanteric bursitis.  With regards to her back issues she knows she can go back and see Dr. Colon but right now she is in the middle of an extensive neurology workup and expects to be going to [place] or the [place] [facility].  Cannot do an injection into her trochanteric bursa today because she is not supposed to have any steroids in her body.  We talked about topical medication.  Will also refer her to physical therapy she is at a higher risk because of her orthostatic hypotension and multiple falls.  Hopefully they can  do some modalities that will be helpful for her with regards to her left hip replacement looks to be in good position without evidence of loosening   PAIN:  Are you having pain? Yes: NPRS scale: 4//10  Pain location: L lateral  Pain description: dull, sharp Aggravating factors: turning in bed, walking Relieving factors: medication  PRECAUTION:    *Orthostatic hypotension  RED FLAGS:     WEIGHT BEARING RESTRICTIONS: No  FALLS:  Has patient fallen in last 6 months? Falls around Nov-Dec  LIVING ENVIRONMENT: Lives with: lives with their spouse Lives in: House/apartment Stairs: No Has following equipment at home: Environmental consultant - 2 wheeled  OCCUPATION: retired  PLOF: Needs assistance with ADLs  PATIENT GOALS: Decrease hip pain  NEXT MD VISIT: Unknown  OBJECTIVE:  Note: Objective measures were completed at Evaluation unless otherwise noted.  DIAGNOSTIC FINDINGS: xray of hip shows no problems with prosthesis  PATIENT SURVEYS:  PSFS: THE PATIENT SPECIFIC FUNCTIONAL SCALE  Place score of 0-10 (0 = unable to perform activity and 10 = able to perform activity at the same level as before injury or problem)  Activity Date: 12/05/23    walking 4    2.dressing 7    3.transfers 5    4.      Total Score 5.33      Total Score = Sum of activity scores/number of activities  Minimally Detectable Change: 3 points (for single activity); 2 points (for average score)  [facility] (nd). The Patient Specific Functional Scale . Retrieved from [email or web address]   COGNITION: Overall cognitive status: Within functional limits for tasks assessed     SENSATION: WFL  MUSCLE LENGTH: Hamstrings:  Left 50 deg   POSTURE: rounded shoulders, forward head, decreased lumbar lordosis, and flexed trunk   PALPATION: 3T tenderness at L GT bursa  LOWER EXTREMITY ROM:  Active ROM Right eval Left eval  Hip flexion  25  Hip  extension    Hip abduction  40  Hip adduction    Hip internal rotation    Hip external rotation    Knee flexion    Knee extension    Ankle dorsiflexion    Ankle plantarflexion    Ankle inversion    Ankle eversion     (  Blank rows = not tested)  LOWER EXTREMITY MMT:  MMT Right eval Left eval  Hip flexion  2+  Hip extension    Hip abduction  2+  Hip adduction  4  Hip internal rotation    Hip external rotation    Knee flexion    Knee extension    Ankle dorsiflexion  4  Ankle plantarflexion    Ankle inversion    Ankle eversion     (Blank rows = not tested)  LOWER EXTREMITY SPECIAL TESTS:  Hip special tests: Belvie (FABER) test: negative and Piriformis test: negative  FUNCTIONAL TESTS:  5 times sit to stand: Unable 2 completed in 50 sec  GAIT: Distance walked: household Assistive device utilized: Environmental consultant - 2 wheeled Level of assistance: SBA Comments:                                                                                                                                 TREATMENT DATE:  01/02/24 Seated 2# ball raises Seated toe raises 2x10 Seated ball squeezes 10x10 sec PPT 2x10 Bridges  2x10 Supine heel lifts 2x10   12/31/23  [id number] Seated toe raises 2x10 Seated ball squeezes 10x10 sec PPT 2x10 Bridges  2x10 Supine heel lifts 2x10  Manual LLE stretching and STM    12/26/23 Seated toe raises 2x10 Seated ball squeezes 10x10 sec PPT 2x10 Bridges  2x10 Supine heel lifts 2x10  Manual LLE stretching and STM  12/23/23  Seated toe raises 2x10 Seated ball squeezes 10x10 sec PPT 2x10 Bridges  2x10 Supine heel lifts 2x10  Manual LLE stretching and STM    12/05/23  Manual treatment - STM at TFL, manual hip ROM, Manual hamstring stretching Education/self care  PATIENT EDUCATION:  Education details: Education in transfer and ambulation tips to decrease irritating the hip  Person educated: Patient and Spouse Education method: Explanation,  Demonstration, Tactile cues, and Verbal cues Education comprehension: verbalized understanding and returned demonstration  HOME EXERCISE PROGRAM: Next visit  ASSESSMENT:  CLINICAL IMPRESSION:    Pt needed min assist on/off table.  With frequent breaks was able to complete program.      Eval-Patient is a 78 y.o. female  presenting to PT with c/o left lateral hip pain.  After consult with MD and Xray she was referred to PT for trochanteric bursitis.  She presents with decreased ROM, decreased strength, altered gait and pain. She will benefit from skilled PT to address these deficits and return to previous LOA. OBJECTIVE IMPAIRMENTS: Abnormal gait, decreased balance, decreased coordination, decreased mobility, difficulty walking, decreased ROM, decreased strength, dizziness, increased fascial restrictions, impaired flexibility, postural dysfunction, and pain.   ACTIVITY LIMITATIONS: sitting, standing, squatting, transfers, bed mobility, and dressing  PARTICIPATION LIMITATIONS: community activity  PERSONAL FACTORS: Age and 3+ comorbidities:   are also affecting patient's functional outcome.   REHAB POTENTIAL: Fair See above  CLINICAL DECISION MAKING: Stable/uncomplicated  EVALUATION COMPLEXITY: Moderate   GOALS: Goals  reviewed with patient? Yes  SHORT TERM GOALS: Target date: 01/16/2024   Pt to be independent with HEP. Baseline: Goal status: INITIAL  2.  Decrease pain in left hip by 2 levels. Baseline:  Goal status: INITIAL  LONG TERM GOALS: Target date: 02/27/2024    Decrease tenderness to 1+ at left GT Baseline:  Goal status: INITIAL  2.  Increase strength by 1/2 to 1 full grade of L LE.   Baseline: 2+/5 Goal status: INITIAL  3.  Increase hamstring flexibility by 50%. Baseline:  Goal status: INITIAL  4.  Ease pain to <5/10 in L hip with all activities. Baseline: 8/10 Goal status: INITIAL  5.  Improve PSFS score by at least 2 levels before  discharge. Baseline: 5.33 Goal status: INITIAL   PLAN:  PT FREQUENCY: 1-2x/week  PT DURATION: 12 weeks  PLANNED INTERVENTIONS: [id number]- PT Re-evaluation, 97110-Therapeutic exercises, 97530- Therapeutic activity, [id number]- Neuromuscular re-education, 97535- Self Care, [id number]- Manual therapy, [phone]- Gait training, Patient/Family education, Joint mobilization, Cryotherapy, and Moist heat  PLAN FOR NEXT SESSION: Progress as tolerated.    Burnard CHRISTELLA Meth, PT 01/02/2024, 3:20 PM

## 2024-01-02 ENCOUNTER — Ambulatory Visit (INDEPENDENT_AMBULATORY_CARE_PROVIDER_SITE_OTHER)

## 2024-01-02 DIAGNOSIS — M25552 Pain in left hip: Secondary | ICD-10-CM

## 2024-01-02 DIAGNOSIS — R2689 Other abnormalities of gait and mobility: Secondary | ICD-10-CM | POA: Diagnosis not present

## 2024-01-02 DIAGNOSIS — R2681 Unsteadiness on feet: Secondary | ICD-10-CM | POA: Diagnosis not present

## 2024-01-02 DIAGNOSIS — M6281 Muscle weakness (generalized): Secondary | ICD-10-CM | POA: Diagnosis not present

## 2024-01-06 DIAGNOSIS — D044 Carcinoma in situ of skin of scalp and neck: Secondary | ICD-10-CM | POA: Diagnosis not present

## 2024-01-06 NOTE — Therapy (Signed)
 OUTPATIENT PHYSICAL THERAPY LOWER EXTREMITY TREATMENT  Patient Name: [name] MRN: [id number] DOB:1945/07/29, 78 y.o., female Today's Date: 01/08/2024  END OF SESSION:   PT End of Session - 01/08/24 0742     Visit Number 6    Number of Visits 25    Date for PT Re-Evaluation 02/27/24    PT Start Time 1430    PT Stop Time 1508    PT Time Calculation (min) 38 min    Activity Tolerance Patient tolerated treatment well    Behavior During Therapy WFL for tasks assessed/performed               Past Medical History:  Diagnosis Date   ADHD (attention deficit hyperactivity disorder)    Allergy    trees/pollen, mold, fungus, dust mites. Takes allergy shots   Arthritis    PAIN AND OA LEFT HIP   Asthma    allergist Dr Kozlo- monthly allergy injections   Autonomic dysfunction    CENTRAL NERVOUS SYSTEM NEUROPATHY - DX BY DR. MAURICE MORE THAN 10 YRS AGO - AND IT IS FELT TO CONTRIBUTE TO THE AUTOMIC  DYSFUNCTION-- PT HAS NUMBNESS LEGS AND FEET AND SOMETIIMES TIPS OF FINGER, SEVERE CONSTIPATION( NO SENSATION TO HAVE BM ), DOUBLE VISION, ORTHOSTATIC HYPOTENSION   Blood transfusion    Breast cancer (HCC)    right side   Bruises easily    Cataract    Chest pain    a. 12/2012 Cath: nl cors, EF 55-65%.   Complication of anesthesia    reaction to some anesthetics/ 7/12 anesth record on chart- states prefers epidural   CVA (cerebrovascular accident) (HCC) 03/21/2018   Depression    Diverticulosis    Dysrhythmia    HX OF HIGH GRADE HEART BLOCK - REQUIRED PACEMAKER INSERTION   Esophageal stricture    Gastritis    GERD (gastroesophageal reflux disease)    H/O hiatal hernia    Hemorrhoids    Hernia of abdominal wall    spigelian hernia RLQ - SURGERY TO REPAIR   Hypothyroidism    Interstitial cystitis    Latex allergy, contact dermatitis    Mallory - Weiss tear    HEALED    Neuromuscular disorder (HCC)    central nervous system neuropathy- seen per Dr MAURICE   Pacemaker     Pericardial effusion    a. 12/2012 following ppm placement;  b. 01/01/2013 Echo: EF 55-60%, small pericardial effusion w/o RV collapse-->No need for tap/window.   PONV (postoperative nausea and vomiting)    pt needs scop patch   Recurrent upper respiratory infection (URI) 1/13- to present   bronchitis following surgery- states improved but still with cough. OV with [facility] Dr Shepard 09/06/11 on chart   Shortness of breath    AT TIMES - BUT MUCH IMPROVED AFTER PACEMAKER WAS REPROGRAMED.   Symptomatic bradycardia    a. 12/2012 s/p MDT dual chamber PPM, ser # [id number]; b. 12/2012 post-op course complicated by pericardial effusinon req lead revision.   Thyroid  disease    Past Surgical History:  Procedure Laterality Date   ABDOMINAL HYSTERECTOMY  1974   BACK SURGERY     cervical fusion 4-5 with plate   BLADDER SUSPENSION     BREAST BIOPSY  2002   NO BLOOD PRESSURES ON RIGHT SIDE/   s/p  axillary node dissection   CYSTO WITH HYDRODISTENSION  09/07/2011   Procedure: CYSTOSCOPY/HYDRODISTENSION;  Surgeon: Arlena LILLETTE Gal, MD;  Location: [facility];  Service: Urology;  Laterality:  N/A;  INSTILLATION OF MARCAINE /PYRIDIUM  INSTILLATION OF MARCAINE /KENALOG    CYSTOSCOPY  1975, 2007   EYE SURGERY     LASIK EYE SURGERY BILATERAL   LAPAROSCOPY  1973   LEAD REVISION N/A 12/30/2012   Procedure: LEAD REVISION;  Surgeon: Danelle LELON Birmingham, MD;  Location: [facility] CATH LAB;  Service: Cardiovascular;  Laterality: N/A;   LEFT HEART CATHETERIZATION WITH CORONARY ANGIOGRAM N/A 12/29/2012   Procedure: LEFT HEART CATHETERIZATION WITH CORONARY ANGIOGRAM;  Surgeon: Peter M Swaziland, MD;  Location: [facility] CATH LAB;  Service: Cardiovascular;  Laterality: N/A;   MASTECTOMY MODIFIED RADICAL     right; with immediate reconstruction   MYRINGOPLASTY  1962   NECK SURGERY     c4-5 ruptured disc   OOPHORECTOMY  1982   PACEMAKER INSERTION     PERMANENT PACEMAKER INSERTION N/A 12/29/2012   Procedure: PERMANENT PACEMAKER INSERTION;   Surgeon: Elspeth JAYSON Sage, MD;  Location: [facility] CATH LAB;  Service: Cardiovascular;  Laterality: N/A;   PPM GENERATOR CHANGEOUT N/A 02/27/2023   Procedure: PPM GENERATOR CHANGEOUT;  Surgeon: Sage Elspeth JAYSON, MD;  Location: [facility] INVASIVE CV LAB;  Service: Cardiovascular;  Laterality: N/A;   RECTOCELE REPAIR     with cystocele repair   TONSILLECTOMY     TOTAL HIP ARTHROPLASTY Right    TOTAL HIP ARTHROPLASTY Left 06/12/2013   Procedure: LEFT TOTAL HIP ARTHROPLASTY ANTERIOR APPROACH;  Surgeon: Lonni CINDERELLA Poli, MD;  Location: [facility];  Service: Orthopedics;  Laterality: Left;   TYMPANOPLASTY  1973   right   ULNAR NERVE TRANSPOSITION Right    VENTRAL HERNIA REPAIR  05/30/2011   Procedure: HERNIA REPAIR VENTRAL ADULT;  Surgeon: Sherlean JINNY Laughter, MD;  Location: [facility];  Service: General;  Laterality: Right;  repair right spigelian hernia   Patient Active Problem List   Diagnosis Date Noted   Disorder of peripheral nervous system 12/05/2023   Disease related peripheral neuropathy 12/05/2023   Abnormal weight loss 10/21/2023   Deep vein thrombosis (DVT) of tibial vein of left lower extremity (HCC) 09/20/2023   Left leg swelling 09/19/2023   Urinary bladder disorder 07/01/2023   Insomnia 06/18/2023   Autonomic failure 06/04/2023   Focal neurological deficit 04/09/2023   Depression    Pain in both lower extremities 01/07/2022   Muscle ache 12/27/2021   Left lumbar radiculopathy 11/06/2021   Gait abnormality 11/06/2021   Trochanteric bursitis, left hip 05/04/2021   S/P Nissen fundoplication (without gastrostomy tube) procedure 03/01/2020   Pleuritic chest pain    Heart block AV complete (HCC) 05/19/2019   Chronic right shoulder pain 02/13/2017   Short Achilles tendon (acquired), left ankle 02/13/2017   Dizziness and giddiness 02/16/2016   Monoallelic mutation of CHEK2 gene 02/02/2015   Genetic testing 01/31/2015   Altered mental status 10/25/2014   Occipital neuralgia of  left side    Cerebral infarction (HCC) brainstem s/p IV tPA (not seen on MRI) 10/07/2014   History of permanent cardiac pacemaker placement 10/07/2014   Cephalalgia    Complicated migraine    HLD (hyperlipidemia)    Headache 10/06/2014   Arthritis of left hip 06/12/2013   Status post THR (total hip replacement) 06/12/2013   Constipation 05/25/2013   Orthostatic hypotension 01/27/2013   Mechanical complication of cardiac pacemaker electrode 01/27/2013   Pre-syncope 01/02/2013   Sinus tachycardia 01/01/2013   Second degree heart block 12/27/2012   Hypokalemia 12/27/2012   Hypothyroidism 12/27/2012   Spigelian hernia 04/20/2011   DYSPNEA 05/09/2010   Malignant neoplasm of  female breast (HCC) 05/05/2010   Asthma 05/05/2010   Diaphragmatic hernia 05/05/2010   Osteoarthritis 05/05/2010   Disorder of bone and cartilage 05/05/2010    PCP: Shepard Ade, MD   REFERRING PROVIDER: Persons, Ronal Dragon, [place]   REFERRING DIAG: Ref dx- [id number] (ICD-10-CM) - Pain of left hip  THERAPY DIAG:  Pain in left hip  Muscle weakness (generalized)  Unsteadiness on feet  Other abnormalities of gait and mobility  Rationale for Evaluation and Treatment: Rehabilitation  ONSET DATE: 2-3 weeks ago  SUBJECTIVE:   SUBJECTIVE STATEMENT: Pt reports the hip pain is a little less since last visit. PERTINENT HISTORY: From referring MD visit--Plan: Findings consistent with trochanteric bursitis.  With regards to her back issues she knows she can go back and see Dr. Colon but right now she is in the middle of an extensive neurology workup and expects to be going to [facility] or [facility].  Cannot do an injection into her trochanteric bursa today because she is not supposed to have any steroids in her body.  We talked about topical medication.  Will also refer her to physical therapy she is at a higher risk because of her orthostatic hypotension and multiple falls.  Hopefully they can do some  modalities that will be helpful for her with regards to her left hip replacement looks to be in good position without evidence of loosening   PAIN:  Are you having pain? Yes: NPRS scale: 4//10  Pain location: L lateral  Pain description: dull, sharp Aggravating factors: turning in bed, walking Relieving factors: medication  PRECAUTION:    *Orthostatic hypotension  RED FLAGS:     WEIGHT BEARING RESTRICTIONS: No  FALLS:  Has patient fallen in last 6 months? Falls around Nov-Dec  LIVING ENVIRONMENT: Lives with: lives with their spouse Lives in: House/apartment Stairs: No Has following equipment at home: Environmental consultant - 2 wheeled  OCCUPATION: retired  PLOF: Needs assistance with ADLs  PATIENT GOALS: Decrease hip pain  NEXT MD VISIT: Unknown  OBJECTIVE:  Note: Objective measures were completed at Evaluation unless otherwise noted.  DIAGNOSTIC FINDINGS: xray of hip shows no problems with prosthesis  PATIENT SURVEYS:  PSFS: THE PATIENT SPECIFIC FUNCTIONAL SCALE  Place score of 0-10 (0 = unable to perform activity and 10 = able to perform activity at the same level as before injury or problem)  Activity Date: 12/05/23    walking 4    2.dressing 7    3.transfers 5    4.      Total Score 5.33      Total Score = Sum of activity scores/number of activities  Minimally Detectable Change: 3 points (for single activity); 2 points (for average score)  [facility] (nd). The Patient Specific Functional Scale . Retrieved from [email or web address]   COGNITION: Overall cognitive status: Within functional limits for tasks assessed     SENSATION: WFL  MUSCLE LENGTH: Hamstrings:  Left 50 deg   POSTURE: rounded shoulders, forward head, decreased lumbar lordosis, and flexed trunk   PALPATION: 3T tenderness at L GT bursa  LOWER EXTREMITY ROM:  Active ROM Right eval Left eval  Hip flexion  25  Hip extension     Hip abduction  40  Hip adduction    Hip internal rotation    Hip external rotation    Knee flexion    Knee extension    Ankle dorsiflexion    Ankle plantarflexion    Ankle inversion    Ankle eversion     (  Blank rows = not tested)  LOWER EXTREMITY MMT:  MMT Right eval Left eval  Hip flexion  2+  Hip extension    Hip abduction  2+  Hip adduction  4  Hip internal rotation    Hip external rotation    Knee flexion    Knee extension    Ankle dorsiflexion  4  Ankle plantarflexion    Ankle inversion    Ankle eversion     (Blank rows = not tested)  LOWER EXTREMITY SPECIAL TESTS:  Hip special tests: Belvie (FABER) test: negative and Piriformis test: negative  FUNCTIONAL TESTS:  5 times sit to stand: Unable 2 completed in 50 sec  GAIT: Distance walked: household Assistive device utilized: Environmental consultant - 2 wheeled Level of assistance: SBA Comments:                                                                                                                                 TREATMENT DATE:  01/06/25 Neuro Re ed Seated 2# ball raises Seated toe raises 2x10 Seated ball squeezes 10x10 sec There Ex PPT 2x10 Bridges  2x10 Supine heel lifts 2x10   01/02/24 Seated 2# ball raises Seated toe raises 2x10 Seated ball squeezes 10x10 sec PPT 2x10 Bridges  2x10 Supine heel lifts 2x10   12/31/23  [id number] Seated toe raises 2x10 Seated ball squeezes 10x10 sec PPT 2x10 Bridges  2x10 Supine heel lifts 2x10  Manual LLE stretching and STM    12/26/23 Seated toe raises 2x10 Seated ball squeezes 10x10 sec PPT 2x10 Bridges  2x10 Supine heel lifts 2x10  Manual LLE stretching and STM  12/23/23  Seated toe raises 2x10 Seated ball squeezes 10x10 sec PPT 2x10 Bridges  2x10 Supine heel lifts 2x10  Manual LLE stretching and STM    12/05/23  Manual treatment - STM at TFL, manual hip ROM, Manual hamstring stretching Education/self care  PATIENT EDUCATION:  Education  details: Education in transfer and ambulation tips to decrease irritating the hip  Person educated: Patient and Spouse Education method: Explanation, Demonstration, Tactile cues, and Verbal cues Education comprehension: verbalized understanding and returned demonstration  HOME EXERCISE PROGRAM: Next visit  ASSESSMENT:  CLINICAL IMPRESSION:    Pt able to perform seated exercises with VC for trunk control.    Eval-Patient is a 78 y.o. female  presenting to PT with c/o left lateral hip pain.  After consult with MD and Xray she was referred to PT for trochanteric bursitis.  She presents with decreased ROM, decreased strength, altered gait and pain. She will benefit from skilled PT to address these deficits and return to previous LOA. OBJECTIVE IMPAIRMENTS: Abnormal gait, decreased balance, decreased coordination, decreased mobility, difficulty walking, decreased ROM, decreased strength, dizziness, increased fascial restrictions, impaired flexibility, postural dysfunction, and pain.   ACTIVITY LIMITATIONS: sitting, standing, squatting, transfers, bed mobility, and dressing  PARTICIPATION LIMITATIONS: community activity  PERSONAL FACTORS: Age and 3+ comorbidities:   are also  affecting patient's functional outcome.   REHAB POTENTIAL: Fair See above  CLINICAL DECISION MAKING: Stable/uncomplicated  EVALUATION COMPLEXITY: Moderate   GOALS: Goals reviewed with patient? Yes  SHORT TERM GOALS: Target date: 01/16/2024   Pt to be independent with HEP. Baseline: Goal status: INITIAL  2.  Decrease pain in left hip by 2 levels. Baseline:  Goal status: INITIAL  LONG TERM GOALS: Target date: 02/27/2024    Decrease tenderness to 1+ at left GT Baseline:  Goal status: INITIAL  2.  Increase strength by 1/2 to 1 full grade of L LE.   Baseline: 2+/5 Goal status: INITIAL  3.  Increase hamstring flexibility by 50%. Baseline:  Goal status: INITIAL  4.  Ease pain to <5/10 in L hip with all  activities. Baseline: 8/10 Goal status: INITIAL  5.  Improve PSFS score by at least 2 levels before discharge. Baseline: 5.33 Goal status: INITIAL   PLAN:  PT FREQUENCY: 1-2x/week  PT DURATION: 12 weeks  PLANNED INTERVENTIONS: [id number]- PT Re-evaluation, 97110-Therapeutic exercises, 97530- Therapeutic activity, [id number]- Neuromuscular re-education, 97535- Self Care, [id number]- Manual therapy, [phone]- Gait training, Patient/Family education, Joint mobilization, Cryotherapy, and Moist heat  PLAN FOR NEXT SESSION: Progress as tolerated.    Burnard CHRISTELLA Meth, PT 01/08/2024, 7:44 AM

## 2024-01-07 ENCOUNTER — Ambulatory Visit (INDEPENDENT_AMBULATORY_CARE_PROVIDER_SITE_OTHER)

## 2024-01-07 DIAGNOSIS — M6281 Muscle weakness (generalized): Secondary | ICD-10-CM

## 2024-01-07 DIAGNOSIS — M25552 Pain in left hip: Secondary | ICD-10-CM

## 2024-01-07 DIAGNOSIS — R2681 Unsteadiness on feet: Secondary | ICD-10-CM

## 2024-01-07 DIAGNOSIS — R2689 Other abnormalities of gait and mobility: Secondary | ICD-10-CM | POA: Diagnosis not present

## 2024-01-08 NOTE — Therapy (Signed)
 OUTPATIENT PHYSICAL THERAPY LOWER EXTREMITY TREATMENT  Patient Name: [name] MRN: [id number] DOB:06-03-1945, 78 y.o., female Today's Date: 01/09/2024  END OF SESSION:   PT End of Session - 01/09/24 1510     Visit Number 7    Number of Visits 25    Date for PT Re-Evaluation 02/27/24    PT Start Time 1430    PT Stop Time 1508    PT Time Calculation (min) 38 min    Activity Tolerance Patient tolerated treatment well    Behavior During Therapy WFL for tasks assessed/performed                Past Medical History:  Diagnosis Date   ADHD (attention deficit hyperactivity disorder)    Allergy    trees/pollen, mold, fungus, dust mites. Takes allergy shots   Arthritis    PAIN AND OA LEFT HIP   Asthma    allergist Dr Kozlo- monthly allergy injections   Autonomic dysfunction    CENTRAL NERVOUS SYSTEM NEUROPATHY - DX BY DR. MAURICE MORE THAN 10 YRS AGO - AND IT IS FELT TO CONTRIBUTE TO THE AUTOMIC  DYSFUNCTION-- PT HAS NUMBNESS LEGS AND FEET AND SOMETIIMES TIPS OF FINGER, SEVERE CONSTIPATION( NO SENSATION TO HAVE BM ), DOUBLE VISION, ORTHOSTATIC HYPOTENSION   Blood transfusion    Breast cancer (HCC)    right side   Bruises easily    Cataract    Chest pain    a. 12/2012 Cath: nl cors, EF 55-65%.   Complication of anesthesia    reaction to some anesthetics/ 7/12 anesth record on chart- states prefers epidural   CVA (cerebrovascular accident) (HCC) 03/21/2018   Depression    Diverticulosis    Dysrhythmia    HX OF HIGH GRADE HEART BLOCK - REQUIRED PACEMAKER INSERTION   Esophageal stricture    Gastritis    GERD (gastroesophageal reflux disease)    H/O hiatal hernia    Hemorrhoids    Hernia of abdominal wall    spigelian hernia RLQ - SURGERY TO REPAIR   Hypothyroidism    Interstitial cystitis    Latex allergy, contact dermatitis    Mallory - Weiss tear    HEALED    Neuromuscular disorder (HCC)    central nervous system neuropathy- seen per Dr MAURICE   Pacemaker     Pericardial effusion    a. 12/2012 following ppm placement;  b. 01/01/2013 Echo: EF 55-60%, small pericardial effusion w/o RV collapse-->No need for tap/window.   PONV (postoperative nausea and vomiting)    pt needs scop patch   Recurrent upper respiratory infection (URI) 1/13- to present   bronchitis following surgery- states improved but still with cough. OV with [facility] Dr Shepard 09/06/11 on chart   Shortness of breath    AT TIMES - BUT MUCH IMPROVED AFTER PACEMAKER WAS REPROGRAMED.   Symptomatic bradycardia    a. 12/2012 s/p MDT dual chamber PPM, ser # [id number]; b. 12/2012 post-op course complicated by pericardial effusinon req lead revision.   Thyroid  disease    Past Surgical History:  Procedure Laterality Date   ABDOMINAL HYSTERECTOMY  1974   BACK SURGERY     cervical fusion 4-5 with plate   BLADDER SUSPENSION     BREAST BIOPSY  2002   NO BLOOD PRESSURES ON RIGHT SIDE/   s/p  axillary node dissection   CYSTO WITH HYDRODISTENSION  09/07/2011   Procedure: CYSTOSCOPY/HYDRODISTENSION;  Surgeon: Arlena LILLETTE Gal, MD;  Location: [facility];  Service: Urology;  Laterality: N/A;  INSTILLATION OF MARCAINE /PYRIDIUM  INSTILLATION OF MARCAINE /KENALOG    CYSTOSCOPY  1975, 2007   EYE SURGERY     LASIK EYE SURGERY BILATERAL   LAPAROSCOPY  1973   LEAD REVISION N/A 12/30/2012   Procedure: LEAD REVISION;  Surgeon: Danelle LELON Birmingham, MD;  Location: [facility] CATH LAB;  Service: Cardiovascular;  Laterality: N/A;   LEFT HEART CATHETERIZATION WITH CORONARY ANGIOGRAM N/A 12/29/2012   Procedure: LEFT HEART CATHETERIZATION WITH CORONARY ANGIOGRAM;  Surgeon: Peter M Swaziland, MD;  Location: [facility] CATH LAB;  Service: Cardiovascular;  Laterality: N/A;   MASTECTOMY MODIFIED RADICAL     right; with immediate reconstruction   MYRINGOPLASTY  1962   NECK SURGERY     c4-5 ruptured disc   OOPHORECTOMY  1982   PACEMAKER INSERTION     PERMANENT PACEMAKER INSERTION N/A 12/29/2012   Procedure: PERMANENT PACEMAKER INSERTION;   Surgeon: Elspeth JAYSON Sage, MD;  Location: [facility] CATH LAB;  Service: Cardiovascular;  Laterality: N/A;   PPM GENERATOR CHANGEOUT N/A 02/27/2023   Procedure: PPM GENERATOR CHANGEOUT;  Surgeon: Sage Elspeth JAYSON, MD;  Location: [facility] INVASIVE CV LAB;  Service: Cardiovascular;  Laterality: N/A;   RECTOCELE REPAIR     with cystocele repair   TONSILLECTOMY     TOTAL HIP ARTHROPLASTY Right    TOTAL HIP ARTHROPLASTY Left 06/12/2013   Procedure: LEFT TOTAL HIP ARTHROPLASTY ANTERIOR APPROACH;  Surgeon: Lonni CINDERELLA Poli, MD;  Location: [facility];  Service: Orthopedics;  Laterality: Left;   TYMPANOPLASTY  1973   right   ULNAR NERVE TRANSPOSITION Right    VENTRAL HERNIA REPAIR  05/30/2011   Procedure: HERNIA REPAIR VENTRAL ADULT;  Surgeon: Sherlean JINNY Laughter, MD;  Location: [facility];  Service: General;  Laterality: Right;  repair right spigelian hernia   Patient Active Problem List   Diagnosis Date Noted   Disorder of peripheral nervous system 12/05/2023   Disease related peripheral neuropathy 12/05/2023   Abnormal weight loss 10/21/2023   Deep vein thrombosis (DVT) of tibial vein of left lower extremity (HCC) 09/20/2023   Left leg swelling 09/19/2023   Urinary bladder disorder 07/01/2023   Insomnia 06/18/2023   Autonomic failure 06/04/2023   Focal neurological deficit 04/09/2023   Depression    Pain in both lower extremities 01/07/2022   Muscle ache 12/27/2021   Left lumbar radiculopathy 11/06/2021   Gait abnormality 11/06/2021   Trochanteric bursitis, left hip 05/04/2021   S/P Nissen fundoplication (without gastrostomy tube) procedure 03/01/2020   Pleuritic chest pain    Heart block AV complete (HCC) 05/19/2019   Chronic right shoulder pain 02/13/2017   Short Achilles tendon (acquired), left ankle 02/13/2017   Dizziness and giddiness 02/16/2016   Monoallelic mutation of CHEK2 gene 02/02/2015   Genetic testing 01/31/2015   Altered mental status 10/25/2014   Occipital neuralgia of  left side    Cerebral infarction (HCC) brainstem s/p IV tPA (not seen on MRI) 10/07/2014   History of permanent cardiac pacemaker placement 10/07/2014   Cephalalgia    Complicated migraine    HLD (hyperlipidemia)    Headache 10/06/2014   Arthritis of left hip 06/12/2013   Status post THR (total hip replacement) 06/12/2013   Constipation 05/25/2013   Orthostatic hypotension 01/27/2013   Mechanical complication of cardiac pacemaker electrode 01/27/2013   Pre-syncope 01/02/2013   Sinus tachycardia 01/01/2013   Second degree heart block 12/27/2012   Hypokalemia 12/27/2012   Hypothyroidism 12/27/2012   Spigelian hernia 04/20/2011   DYSPNEA 05/09/2010   Malignant neoplasm  of female breast (HCC) 05/05/2010   Asthma 05/05/2010   Diaphragmatic hernia 05/05/2010   Osteoarthritis 05/05/2010   Disorder of bone and cartilage 05/05/2010    PCP: Shepard Ade, MD   REFERRING PROVIDER: Persons, Ronal Dragon, [place]   REFERRING DIAG: Ref dx- [phone] (ICD-10-CM) - Pain of left hip  THERAPY DIAG:  Pain in left hip  Muscle weakness (generalized)  Unsteadiness on feet  Other abnormalities of gait and mobility  Rationale for Evaluation and Treatment: Rehabilitation  ONSET DATE: 2-3 weeks ago  SUBJECTIVE:   SUBJECTIVE STATEMENT: Pt reports hip pain is the same.  Feels weak with transfers. PERTINENT HISTORY: From referring MD visit--Plan: Findings consistent with trochanteric bursitis.  With regards to her back issues she knows she can go back and see Dr. Colon but right now she is in the middle of an extensive neurology workup and expects to be going to [place] or the [place] [facility].  Cannot do an injection into her trochanteric bursa today because she is not supposed to have any steroids in her body.  We talked about topical medication.  Will also refer her to physical therapy she is at a higher risk because of her orthostatic hypotension and multiple falls.  Hopefully they can do some  modalities that will be helpful for her with regards to her left hip replacement looks to be in good position without evidence of loosening   PAIN:  Are you having pain? Yes: NPRS scale: 4//10  Pain location: L lateral  Pain description: dull, sharp Aggravating factors: turning in bed, walking Relieving factors: medication  PRECAUTION:    *Orthostatic hypotension  RED FLAGS:     WEIGHT BEARING RESTRICTIONS: No  FALLS:  Has patient fallen in last 6 months? Falls around Nov-Dec  LIVING ENVIRONMENT: Lives with: lives with their spouse Lives in: House/apartment Stairs: No Has following equipment at home: Environmental consultant - 2 wheeled  OCCUPATION: retired  PLOF: Needs assistance with ADLs  PATIENT GOALS: Decrease hip pain  NEXT MD VISIT: Unknown  OBJECTIVE:  Note: Objective measures were completed at Evaluation unless otherwise noted.  DIAGNOSTIC FINDINGS: xray of hip shows no problems with prosthesis  PATIENT SURVEYS:  PSFS: THE PATIENT SPECIFIC FUNCTIONAL SCALE  Place score of 0-10 (0 = unable to perform activity and 10 = able to perform activity at the same level as before injury or problem)  Activity Date: 12/05/23    walking 4    2.dressing 7    3.transfers 5    4.      Total Score 5.33      Total Score = Sum of activity scores/number of activities  Minimally Detectable Change: 3 points (for single activity); 2 points (for average score)  [facility] (nd). The Patient Specific Functional Scale . Retrieved from [email or web address]   COGNITION: Overall cognitive status: Within functional limits for tasks assessed     SENSATION: WFL  MUSCLE LENGTH: Hamstrings:  Left 50 deg   POSTURE: rounded shoulders, forward head, decreased lumbar lordosis, and flexed trunk   PALPATION: 3T tenderness at L GT bursa  LOWER EXTREMITY ROM:  Active ROM Right eval Left eval  Hip flexion  25  Hip extension     Hip abduction  40  Hip adduction    Hip internal rotation    Hip external rotation    Knee flexion    Knee extension    Ankle dorsiflexion    Ankle plantarflexion    Ankle inversion    Ankle  eversion     (Blank rows = not tested)  LOWER EXTREMITY MMT:  MMT Right eval Left eval  Hip flexion  2+  Hip extension    Hip abduction  2+  Hip adduction  4  Hip internal rotation    Hip external rotation    Knee flexion    Knee extension    Ankle dorsiflexion  4  Ankle plantarflexion    Ankle inversion    Ankle eversion     (Blank rows = not tested)  LOWER EXTREMITY SPECIAL TESTS:  Hip special tests: Belvie (FABER) test: negative and Piriformis test: negative  FUNCTIONAL TESTS:  5 times sit to stand: Unable 2 completed in 50 sec  GAIT: Distance walked: household Assistive device utilized: Environmental consultant - 2 wheeled Level of assistance: SBA Comments:                                                                                                                                 TREATMENT DATE:  01/09/24 Neuro Re ed Seated 2# ball raises Seated toe raises 2x10 Seated ball squeezes 10x10 sec Seated T band hip abduction Green 3x10 There Ex PPT 2x10 Bridges  2x10 Supine heel lifts 2x10     01/06/25 Neuro Re ed Seated 2# ball raises Seated toe raises 2x10 Seated ball squeezes 10x10 sec There Ex PPT 2x10 Bridges  2x10 Supine heel lifts 2x10   01/02/24 Seated 2# ball raises Seated toe raises 2x10 Seated ball squeezes 10x10 sec PPT 2x10 Bridges  2x10 Supine heel lifts 2x10   12/31/23  [id number] Seated toe raises 2x10 Seated ball squeezes 10x10 sec PPT 2x10 Bridges  2x10 Supine heel lifts 2x10  Manual LLE stretching and STM    12/26/23 Seated toe raises 2x10 Seated ball squeezes 10x10 sec PPT 2x10 Bridges  2x10 Supine heel lifts 2x10  Manual LLE stretching and STM  12/23/23  Seated toe raises 2x10 Seated ball squeezes 10x10 sec PPT 2x10 Bridges   2x10 Supine heel lifts 2x10  Manual LLE stretching and STM    12/05/23  Manual treatment - STM at TFL, manual hip ROM, Manual hamstring stretching Education/self care  PATIENT EDUCATION:  Education details: Education in transfer and ambulation tips to decrease irritating the hip  Person educated: Patient and Spouse Education method: Programmer, multimedia, Demonstration, Tactile cues, and Verbal cues Education comprehension: verbalized understanding and returned demonstration  HOME EXERCISE PROGRAM: Next visit  ASSESSMENT:  CLINICAL IMPRESSION:    Pt demonstrated improved supine marching with increased reps before needed a rest break.   Eval-Patient is a 78 y.o. female  presenting to PT with c/o left lateral hip pain.  After consult with MD and Xray she was referred to PT for trochanteric bursitis.  She presents with decreased ROM, decreased strength, altered gait and pain. She will benefit from skilled PT to address these deficits and return to previous LOA. OBJECTIVE IMPAIRMENTS: Abnormal gait, decreased balance, decreased coordination, decreased  mobility, difficulty walking, decreased ROM, decreased strength, dizziness, increased fascial restrictions, impaired flexibility, postural dysfunction, and pain.   ACTIVITY LIMITATIONS: sitting, standing, squatting, transfers, bed mobility, and dressing  PARTICIPATION LIMITATIONS: community activity  PERSONAL FACTORS: Age and 3+ comorbidities:   are also affecting patient's functional outcome.   REHAB POTENTIAL: Fair See above  CLINICAL DECISION MAKING: Stable/uncomplicated  EVALUATION COMPLEXITY: Moderate   GOALS: Goals reviewed with patient? Yes  SHORT TERM GOALS: Target date: 01/16/2024   Pt to be independent with HEP. Baseline: Goal status: INITIAL  2.  Decrease pain in left hip by 2 levels. Baseline:  Goal status: INITIAL  LONG TERM GOALS: Target date: 02/27/2024    Decrease tenderness to 1+ at left GT Baseline:  Goal  status: INITIAL  2.  Increase strength by 1/2 to 1 full grade of L LE.   Baseline: 2+/5 Goal status: INITIAL  3.  Increase hamstring flexibility by 50%. Baseline:  Goal status: INITIAL  4.  Ease pain to <5/10 in L hip with all activities. Baseline: 8/10 Goal status: INITIAL  5.  Improve PSFS score by at least 2 levels before discharge. Baseline: 5.33 Goal status: INITIAL   PLAN:  PT FREQUENCY: 1-2x/week  PT DURATION: 12 weeks  PLANNED INTERVENTIONS: [id number]- PT Re-evaluation, 97110-Therapeutic exercises, 97530- Therapeutic activity, [id number]- Neuromuscular re-education, 97535- Self Care, [id number]- Manual therapy, [phone]- Gait training, Patient/Family education, Joint mobilization, Cryotherapy, and Moist heat  PLAN FOR NEXT SESSION: Progress as tolerated.    Burnard CHRISTELLA Meth, PT 01/09/2024, 3:11 PM

## 2024-01-09 ENCOUNTER — Ambulatory Visit (INDEPENDENT_AMBULATORY_CARE_PROVIDER_SITE_OTHER)

## 2024-01-09 DIAGNOSIS — R2681 Unsteadiness on feet: Secondary | ICD-10-CM

## 2024-01-09 DIAGNOSIS — M25552 Pain in left hip: Secondary | ICD-10-CM | POA: Diagnosis not present

## 2024-01-09 DIAGNOSIS — R2689 Other abnormalities of gait and mobility: Secondary | ICD-10-CM

## 2024-01-09 DIAGNOSIS — M6281 Muscle weakness (generalized): Secondary | ICD-10-CM

## 2024-01-14 ENCOUNTER — Encounter

## 2024-01-14 DIAGNOSIS — D044 Carcinoma in situ of skin of scalp and neck: Secondary | ICD-10-CM | POA: Diagnosis not present

## 2024-01-16 ENCOUNTER — Ambulatory Visit

## 2024-01-16 DIAGNOSIS — M6281 Muscle weakness (generalized): Secondary | ICD-10-CM

## 2024-01-16 DIAGNOSIS — R2681 Unsteadiness on feet: Secondary | ICD-10-CM | POA: Diagnosis not present

## 2024-01-16 DIAGNOSIS — R2689 Other abnormalities of gait and mobility: Secondary | ICD-10-CM | POA: Diagnosis not present

## 2024-01-16 DIAGNOSIS — M25552 Pain in left hip: Secondary | ICD-10-CM | POA: Diagnosis not present

## 2024-01-16 NOTE — Therapy (Signed)
 OUTPATIENT PHYSICAL THERAPY LOWER EXTREMITY TREATMENT  Patient Name: [name] MRN: [id number] DOB:Aug 29, 1945, 78 y.o., female Today's Date: 01/16/2024  END OF SESSION:   PT End of Session - 01/16/24 1610     Visit Number 8    Number of Visits 25    Date for PT Re-Evaluation 02/27/24    PT Start Time 1440    PT Stop Time 1510    PT Time Calculation (min) 30 min    Activity Tolerance Patient tolerated treatment well    Behavior During Therapy WFL for tasks assessed/performed                 Past Medical History:  Diagnosis Date   ADHD (attention deficit hyperactivity disorder)    Allergy    trees/pollen, mold, fungus, dust mites. Takes allergy shots   Arthritis    PAIN AND OA LEFT HIP   Asthma    allergist Dr Kozlo- monthly allergy injections   Autonomic dysfunction    CENTRAL NERVOUS SYSTEM NEUROPATHY - DX BY DR. MAURICE MORE THAN 10 YRS AGO - AND IT IS FELT TO CONTRIBUTE TO THE AUTOMIC  DYSFUNCTION-- PT HAS NUMBNESS LEGS AND FEET AND SOMETIIMES TIPS OF FINGER, SEVERE CONSTIPATION( NO SENSATION TO HAVE BM ), DOUBLE VISION, ORTHOSTATIC HYPOTENSION   Blood transfusion    Breast cancer (HCC)    right side   Bruises easily    Cataract    Chest pain    a. 12/2012 Cath: nl cors, EF 55-65%.   Complication of anesthesia    reaction to some anesthetics/ 7/12 anesth record on chart- states prefers epidural   CVA (cerebrovascular accident) (HCC) 03/21/2018   Depression    Diverticulosis    Dysrhythmia    HX OF HIGH GRADE HEART BLOCK - REQUIRED PACEMAKER INSERTION   Esophageal stricture    Gastritis    GERD (gastroesophageal reflux disease)    H/O hiatal hernia    Hemorrhoids    Hernia of abdominal wall    spigelian hernia RLQ - SURGERY TO REPAIR   Hypothyroidism    Interstitial cystitis    Latex allergy, contact dermatitis    Mallory - Weiss tear    HEALED    Neuromuscular disorder (HCC)    central nervous system neuropathy- seen per Dr MAURICE   Pacemaker     Pericardial effusion    a. 12/2012 following ppm placement;  b. 01/01/2013 Echo: EF 55-60%, small pericardial effusion w/o RV collapse-->No need for tap/window.   PONV (postoperative nausea and vomiting)    pt needs scop patch   Recurrent upper respiratory infection (URI) 1/13- to present   bronchitis following surgery- states improved but still with cough. OV with [facility] Dr Shepard 09/06/11 on chart   Shortness of breath    AT TIMES - BUT MUCH IMPROVED AFTER PACEMAKER WAS REPROGRAMED.   Symptomatic bradycardia    a. 12/2012 s/p MDT dual chamber PPM, ser # [id number]; b. 12/2012 post-op course complicated by pericardial effusinon req lead revision.   Thyroid  disease    Past Surgical History:  Procedure Laterality Date   ABDOMINAL HYSTERECTOMY  1974   BACK SURGERY     cervical fusion 4-5 with plate   BLADDER SUSPENSION     BREAST BIOPSY  2002   NO BLOOD PRESSURES ON RIGHT SIDE/   s/p  axillary node dissection   CYSTO WITH HYDRODISTENSION  09/07/2011   Procedure: CYSTOSCOPY/HYDRODISTENSION;  Surgeon: Arlena LILLETTE Gal, MD;  Location: [facility];  Service: Urology;  Laterality: N/A;  INSTILLATION OF MARCAINE /PYRIDIUM  INSTILLATION OF MARCAINE /KENALOG    CYSTOSCOPY  1975, 2007   EYE SURGERY     LASIK EYE SURGERY BILATERAL   LAPAROSCOPY  1973   LEAD REVISION N/A 12/30/2012   Procedure: LEAD REVISION;  Surgeon: Danelle LELON Birmingham, MD;  Location: [facility] CATH LAB;  Service: Cardiovascular;  Laterality: N/A;   LEFT HEART CATHETERIZATION WITH CORONARY ANGIOGRAM N/A 12/29/2012   Procedure: LEFT HEART CATHETERIZATION WITH CORONARY ANGIOGRAM;  Surgeon: Peter M Swaziland, MD;  Location: [facility] CATH LAB;  Service: Cardiovascular;  Laterality: N/A;   MASTECTOMY MODIFIED RADICAL     right; with immediate reconstruction   MYRINGOPLASTY  1962   NECK SURGERY     c4-5 ruptured disc   OOPHORECTOMY  1982   PACEMAKER INSERTION     PERMANENT PACEMAKER INSERTION N/A 12/29/2012   Procedure: PERMANENT PACEMAKER INSERTION;   Surgeon: Elspeth JAYSON Sage, MD;  Location[facility] CATH LAB;  Service: Cardiovascular;  Laterality: N/A;   PPM GENERATOR CHANGEOUT N/A 02/27/2023   Procedure: PPM GENERATOR CHANGEOUT;  Surgeon: Sage Elspeth JAYSON, MD;  Location: [facility] INVASIVE CV LAB;  Service: Cardiovascular;  Laterality: N/A;   RECTOCELE REPAIR     with cystocele repair   TONSILLECTOMY     TOTAL HIP ARTHROPLASTY Right    TOTAL HIP ARTHROPLASTY Left 06/12/2013   Procedure: LEFT TOTAL HIP ARTHROPLASTY ANTERIOR APPROACH;  Surgeon: Lonni CINDERELLA Poli, MD;  Location: [facility];  Service: Orthopedics;  Laterality: Left;   TYMPANOPLASTY  1973   right   ULNAR NERVE TRANSPOSITION Right    VENTRAL HERNIA REPAIR  05/30/2011   Procedure: HERNIA REPAIR VENTRAL ADULT;  Surgeon: Sherlean JINNY Laughter, MD;  Location: [facility];  Service: General;  Laterality: Right;  repair right spigelian hernia   Patient Active Problem List   Diagnosis Date Noted   Disorder of peripheral nervous system 12/05/2023   Disease related peripheral neuropathy 12/05/2023   Abnormal weight loss 10/21/2023   Deep vein thrombosis (DVT) of tibial vein of left lower extremity (HCC) 09/20/2023   Left leg swelling 09/19/2023   Urinary bladder disorder 07/01/2023   Insomnia 06/18/2023   Autonomic failure 06/04/2023   Focal neurological deficit 04/09/2023   Depression    Pain in both lower extremities 01/07/2022   Muscle ache 12/27/2021   Left lumbar radiculopathy 11/06/2021   Gait abnormality 11/06/2021   Trochanteric bursitis, left hip 05/04/2021   S/P Nissen fundoplication (without gastrostomy tube) procedure 03/01/2020   Pleuritic chest pain    Heart block AV complete (HCC) 05/19/2019   Chronic right shoulder pain 02/13/2017   Short Achilles tendon (acquired), left ankle 02/13/2017   Dizziness and giddiness 02/16/2016   Monoallelic mutation of CHEK2 gene 02/02/2015   Genetic testing 01/31/2015   Altered mental status 10/25/2014   Occipital neuralgia of  left side    Cerebral infarction (HCC) brainstem s/p IV tPA (not seen on MRI) 10/07/2014   History of permanent cardiac pacemaker placement 10/07/2014   Cephalalgia    Complicated migraine    HLD (hyperlipidemia)    Headache 10/06/2014   Arthritis of left hip 06/12/2013   Status post THR (total hip replacement) 06/12/2013   Constipation 05/25/2013   Orthostatic hypotension 01/27/2013   Mechanical complication of cardiac pacemaker electrode 01/27/2013   Pre-syncope 01/02/2013   Sinus tachycardia 01/01/2013   Second degree heart block 12/27/2012   Hypokalemia 12/27/2012   Hypothyroidism 12/27/2012   Spigelian hernia 04/20/2011   DYSPNEA 05/09/2010   Malignant neoplasm  of female breast (HCC) 05/05/2010   Asthma 05/05/2010   Diaphragmatic hernia 05/05/2010   Osteoarthritis 05/05/2010   Disorder of bone and cartilage 05/05/2010    PCP: Shepard Ade, MD   REFERRING PROVIDER: Onita Duos, MD   REFERRING DIAG: Ref dx- M25.552 (ICD-10-CM) - Pain of left hip  THERAPY DIAG:  Pain in left hip  Muscle weakness (generalized)  Unsteadiness on feet  Other abnormalities of gait and mobility  Rationale for Evaluation and Treatment: Rehabilitation  ONSET DATE: 2-3 weeks ago  SUBJECTIVE:   SUBJECTIVE STATEMENT: Pt reports hip pain is still felt with transfers.  No pain with sitting. PERTINENT HISTORY: From referring MD visit--Plan: Findings consistent with trochanteric bursitis.  With regards to her back issues she knows she can go back and see Dr. Colon but right now she is in the middle of an extensive neurology workup and expects to be going to [place] or the [place] clinic.  Cannot do an injection into her trochanteric bursa today because she is not supposed to have any steroids in her body.  We talked about topical medication.  Will also refer her to physical therapy she is at a higher risk because of her orthostatic hypotension and multiple falls.  Hopefully they can do  some modalities that will be helpful for her with regards to her left hip replacement looks to be in good position without evidence of loosening   PAIN:  Are you having pain? Yes: NPRS scale: 8/10 sharp grab 4/10 normally  Pain location: L lateral  Pain description: dull, sharp Aggravating factors: turning in bed, walking Relieving factors: medication  PRECAUTION:    *Orthostatic hypotension  RED FLAGS:     WEIGHT BEARING RESTRICTIONS: No  FALLS:  Has patient fallen in last 6 months? Falls around Nov-Dec  LIVING ENVIRONMENT: Lives with: lives with their spouse Lives in: House/apartment Stairs: No Has following equipment at home: Environmental consultant - 2 wheeled  OCCUPATION: retired  PLOF: Needs assistance with ADLs  PATIENT GOALS: Decrease hip pain  NEXT MD VISIT: Unknown  OBJECTIVE:  Note: Objective measures were completed at Evaluation unless otherwise noted.  DIAGNOSTIC FINDINGS: xray of hip shows no problems with prosthesis  PATIENT SURVEYS:  PSFS: THE PATIENT SPECIFIC FUNCTIONAL SCALE  Place score of 0-10 (0 = unable to perform activity and 10 = able to perform activity at the same level as before injury or problem)  Activity Date: 12/05/23    walking 4    2.dressing 7    3.transfers 5    4.      Total Score 5.33      Total Score = Sum of activity scores/number of activities  Minimally Detectable Change: 3 points (for single activity); 2 points (for average score)  [facility] (nd). The Patient Specific Functional Scale . Retrieved from [email or web address]   COGNITION: Overall cognitive status: Within functional limits for tasks assessed     SENSATION: WFL  MUSCLE LENGTH: Hamstrings:  Left 50 deg   POSTURE: rounded shoulders, forward head, decreased lumbar lordosis, and flexed trunk   PALPATION: 3T tenderness at L GT bursa  LOWER EXTREMITY ROM:  Active ROM Right eval Left eval  Hip  flexion  25  Hip extension    Hip abduction  40  Hip adduction    Hip internal rotation    Hip external rotation    Knee flexion    Knee extension    Ankle dorsiflexion    Ankle plantarflexion    Ankle  inversion    Ankle eversion     (Blank rows = not tested)  LOWER EXTREMITY MMT:  MMT Right eval Left eval  Hip flexion  2+  Hip extension    Hip abduction  2+  Hip adduction  4  Hip internal rotation    Hip external rotation    Knee flexion    Knee extension    Ankle dorsiflexion  4  Ankle plantarflexion    Ankle inversion    Ankle eversion     (Blank rows = not tested)  LOWER EXTREMITY SPECIAL TESTS:  Hip special tests: Belvie (FABER) test: negative and Piriformis test: negative  FUNCTIONAL TESTS:  5 times sit to stand: Unable 2 completed in 50 sec  GAIT: Distance walked: household Assistive device utilized: Environmental consultant - 2 wheeled Level of assistance: SBA Comments:                                                                                                                                 TREATMENT DATE:  01/16/24 Neuro Re ed Seated 2# ball raises Seated toe raises 2x10 Seated ball squeezes 10x10 sec Seated T band hip abduction Green 3x10 There Ex PPT 2x10 Bridges  2x10 Supine heel lifts 2x10 Manual  Percussive device to L TFM   01/09/24 Neuro Re ed Seated 2# ball raises Seated toe raises 2x10 Seated ball squeezes 10x10 sec Seated T band hip abduction Green 3x10 There Ex PPT 2x10 Bridges  2x10 Supine heel lifts 2x10     01/06/25 Neuro Re ed Seated 2# ball raises Seated toe raises 2x10 Seated ball squeezes 10x10 sec There Ex PPT 2x10 Bridges  2x10 Supine heel lifts 2x10   01/02/24 Seated 2# ball raises Seated toe raises 2x10 Seated ball squeezes 10x10 sec PPT 2x10 Bridges  2x10 Supine heel lifts 2x10   12/31/23  [id number] Seated toe raises 2x10 Seated ball squeezes 10x10 sec PPT 2x10 Bridges  2x10 Supine heel lifts 2x10   Manual LLE stretching and STM    12/26/23 Seated toe raises 2x10 Seated ball squeezes 10x10 sec PPT 2x10 Bridges  2x10 Supine heel lifts 2x10  Manual LLE stretching and STM  12/23/23  Seated toe raises 2x10 Seated ball squeezes 10x10 sec PPT 2x10 Bridges  2x10 Supine heel lifts 2x10  Manual LLE stretching and STM    12/05/23  Manual treatment - STM at TFL, manual hip ROM, Manual hamstring stretching Education/self care  PATIENT EDUCATION:  Education details: Education in transfer and ambulation tips to decrease irritating the hip  Person educated: Patient and Spouse Education method: Explanation, Demonstration, Tactile cues, and Verbal cues Education comprehension: verbalized understanding and returned demonstration  HOME EXERCISE PROGRAM: Next visit  ASSESSMENT:  CLINICAL IMPRESSION:    Pain is decreasing overall and all STG completed.  OBJECTIVE IMPAIRMENTS: Abnormal gait, decreased balance, decreased coordination, decreased mobility, difficulty walking, decreased ROM, decreased strength, dizziness, increased fascial restrictions, impaired flexibility, postural dysfunction,  and pain.   ACTIVITY LIMITATIONS: sitting, standing, squatting, transfers, bed mobility, and dressing  PARTICIPATION LIMITATIONS: community activity  PERSONAL FACTORS: Age and 3+ comorbidities:   are also affecting patient's functional outcome.   REHAB POTENTIAL: Fair See above  CLINICAL DECISION MAKING: Stable/uncomplicated  EVALUATION COMPLEXITY: Moderate   GOALS: Goals reviewed with patient? Yes  SHORT TERM GOALS: Target date: 01/16/2024   Pt to be independent with HEP. Baseline: Goal status: MET 01/16/24  2.  Decrease pain in left hip by 2 levels. Baseline: 8/10 Goal status: MET 01/16/24  LONG TERM GOALS: Target date: 02/27/2024    Decrease tenderness to 1+ at left GT Baseline:  Goal status: INITIAL  2.  Increase strength by 1/2 to 1 full grade of L LE.   Baseline:  2+/5 Goal status: INITIAL  3.  Increase hamstring flexibility by 50%. Baseline:  Goal status: INITIAL  4.  Ease pain to <5/10 in L hip with all activities. Baseline: 8/10 Goal status: INITIAL  5.  Improve PSFS score by at least 2 levels before discharge. Baseline: 5.33 Goal status: INITIAL   PLAN:  PT FREQUENCY: 1-2x/week  PT DURATION: 12 weeks  PLANNED INTERVENTIONS: [id number]- PT Re-evaluation, 97110-Therapeutic exercises, 97530- Therapeutic activity, [id number]- Neuromuscular re-education, 97535- Self Care, [id number]- Manual therapy, [phone]- Gait training, Patient/Family education, Joint mobilization, Cryotherapy, and Moist heat  PLAN FOR NEXT SESSION: Progress as tolerated.    Burnard CHRISTELLA Meth, PT 01/16/2024, 4:13 PM

## 2024-01-19 NOTE — Therapy (Signed)
 OUTPATIENT PHYSICAL THERAPY LOWER EXTREMITY TREATMENT  Patient Name: [name] MRN: [id number] DOB:Feb 23, 1946, 78 y.o., female Today's Date: 01/21/2024  END OF SESSION:    PT End of Session - 01/21/24 1557     Visit Number 9    Number of Visits 25    Date for PT Re-Evaluation 02/27/24    PT Start Time 1430    PT Stop Time 1510    PT Time Calculation (min) 40 min    Activity Tolerance Patient tolerated treatment well    Behavior During Therapy WFL for tasks assessed/performed                  Past Medical History:  Diagnosis Date   ADHD (attention deficit hyperactivity disorder)    Allergy    trees/pollen, mold, fungus, dust mites. Takes allergy shots   Arthritis    PAIN AND OA LEFT HIP   Asthma    allergist Dr Kozlo- monthly allergy injections   Autonomic dysfunction    CENTRAL NERVOUS SYSTEM NEUROPATHY - DX BY DR. MAURICE MORE THAN 10 YRS AGO - AND IT IS FELT TO CONTRIBUTE TO THE AUTOMIC  DYSFUNCTION-- PT HAS NUMBNESS LEGS AND FEET AND SOMETIIMES TIPS OF FINGER, SEVERE CONSTIPATION( NO SENSATION TO HAVE BM ), DOUBLE VISION, ORTHOSTATIC HYPOTENSION   Blood transfusion    Breast cancer (HCC)    right side   Bruises easily    Cataract    Chest pain    a. 12/2012 Cath: nl cors, EF 55-65%.   Complication of anesthesia    reaction to some anesthetics/ 7/12 anesth record on chart- states prefers epidural   CVA (cerebrovascular accident) (HCC) 03/21/2018   Depression    Diverticulosis    Dysrhythmia    HX OF HIGH GRADE HEART BLOCK - REQUIRED PACEMAKER INSERTION   Esophageal stricture    Gastritis    GERD (gastroesophageal reflux disease)    H/O hiatal hernia    Hemorrhoids    Hernia of abdominal wall    spigelian hernia RLQ - SURGERY TO REPAIR   Hypothyroidism    Interstitial cystitis    Latex allergy, contact dermatitis    Mallory - Weiss tear    HEALED    Neuromuscular disorder (HCC)    central nervous system neuropathy- seen per Dr MAURICE    Pacemaker    Pericardial effusion    a. 12/2012 following ppm placement;  b. 01/01/2013 Echo: EF 55-60%, small pericardial effusion w/o RV collapse-->No need for tap/window.   PONV (postoperative nausea and vomiting)    pt needs scop patch   Recurrent upper respiratory infection (URI) 1/13- to present   bronchitis following surgery- states improved but still with cough. OV with [facility] Dr Shepard 09/06/11 on chart   Shortness of breath    AT TIMES - BUT MUCH IMPROVED AFTER PACEMAKER WAS REPROGRAMED.   Symptomatic bradycardia    a. 12/2012 s/p MDT dual chamber PPM, ser # [id number]; b. 12/2012 post-op course complicated by pericardial effusinon req lead revision.   Thyroid  disease    Past Surgical History:  Procedure Laterality Date   ABDOMINAL HYSTERECTOMY  1974   BACK SURGERY     cervical fusion 4-5 with plate   BLADDER SUSPENSION     BREAST BIOPSY  2002   NO BLOOD PRESSURES ON RIGHT SIDE/   s/p  axillary node dissection   CYSTO WITH HYDRODISTENSION  09/07/2011   Procedure: CYSTOSCOPY/HYDRODISTENSION;  Surgeon: Arlena LILLETTE Gal, MD;  Location: [facility];  Service: Urology;  Laterality: N/A;  INSTILLATION OF MARCAINE /PYRIDIUM  INSTILLATION OF MARCAINE /KENALOG    CYSTOSCOPY  1975, 2007   EYE SURGERY     LASIK EYE SURGERY BILATERAL   LAPAROSCOPY  1973   LEAD REVISION N/A 12/30/2012   Procedure: LEAD REVISION;  Surgeon: Danelle LELON Birmingham, MD;  Location: [facility] CATH LAB;  Service: Cardiovascular;  Laterality: N/A;   LEFT HEART CATHETERIZATION WITH CORONARY ANGIOGRAM N/A 12/29/2012   Procedure: LEFT HEART CATHETERIZATION WITH CORONARY ANGIOGRAM;  Surgeon: Peter M Swaziland, MD;  Location: [facility] CATH LAB;  Service: Cardiovascular;  Laterality: N/A;   MASTECTOMY MODIFIED RADICAL     right; with immediate reconstruction   MYRINGOPLASTY  1962   NECK SURGERY     c4-5 ruptured disc   OOPHORECTOMY  1982   PACEMAKER INSERTION     PERMANENT PACEMAKER INSERTION N/A 12/29/2012   Procedure: PERMANENT PACEMAKER  INSERTION;  Surgeon: Elspeth JAYSON Sage, MD;  Location: [facility] CATH LAB;  Service: Cardiovascular;  Laterality: N/A;   PPM GENERATOR CHANGEOUT N/A 02/27/2023   Procedure: PPM GENERATOR CHANGEOUT;  Surgeon: Sage Elspeth JAYSON, MD;  Location: [facility] INVASIVE CV LAB;  Service: Cardiovascular;  Laterality: N/A;   RECTOCELE REPAIR     with cystocele repair   TONSILLECTOMY     TOTAL HIP ARTHROPLASTY Right    TOTAL HIP ARTHROPLASTY Left 06/12/2013   Procedure: LEFT TOTAL HIP ARTHROPLASTY ANTERIOR APPROACH;  Surgeon: Lonni CINDERELLA Poli, MD;  Location: [facility];  Service: Orthopedics;  Laterality: Left;   TYMPANOPLASTY  1973   right   ULNAR NERVE TRANSPOSITION Right    VENTRAL HERNIA REPAIR  05/30/2011   Procedure: HERNIA REPAIR VENTRAL ADULT;  Surgeon: Sherlean JINNY Laughter, MD;  Location: [facility];  Service: General;  Laterality: Right;  repair right spigelian hernia   Patient Active Problem List   Diagnosis Date Noted   Disorder of peripheral nervous system 12/05/2023   Disease related peripheral neuropathy 12/05/2023   Abnormal weight loss 10/21/2023   Deep vein thrombosis (DVT) of tibial vein of left lower extremity (HCC) 09/20/2023   Left leg swelling 09/19/2023   Urinary bladder disorder 07/01/2023   Insomnia 06/18/2023   Autonomic failure 06/04/2023   Focal neurological deficit 04/09/2023   Depression    Pain in both lower extremities 01/07/2022   Muscle ache 12/27/2021   Left lumbar radiculopathy 11/06/2021   Gait abnormality 11/06/2021   Trochanteric bursitis, left hip 05/04/2021   S/P Nissen fundoplication (without gastrostomy tube) procedure 03/01/2020   Pleuritic chest pain    Heart block AV complete (HCC) 05/19/2019   Chronic right shoulder pain 02/13/2017   Short Achilles tendon (acquired), left ankle 02/13/2017   Dizziness and giddiness 02/16/2016   Monoallelic mutation of CHEK2 gene 02/02/2015   Genetic testing 01/31/2015   Altered mental status 10/25/2014   Occipital  neuralgia of left side    Cerebral infarction (HCC) brainstem s/p IV tPA (not seen on MRI) 10/07/2014   History of permanent cardiac pacemaker placement 10/07/2014   Cephalalgia    Complicated migraine    HLD (hyperlipidemia)    Headache 10/06/2014   Arthritis of left hip 06/12/2013   Status post THR (total hip replacement) 06/12/2013   Constipation 05/25/2013   Orthostatic hypotension 01/27/2013   Mechanical complication of cardiac pacemaker electrode 01/27/2013   Pre-syncope 01/02/2013   Sinus tachycardia 01/01/2013   Second degree heart block 12/27/2012   Hypokalemia 12/27/2012   Hypothyroidism 12/27/2012   Spigelian hernia 04/20/2011   DYSPNEA 05/09/2010  Malignant neoplasm of female breast (HCC) 05/05/2010   Asthma 05/05/2010   Diaphragmatic hernia 05/05/2010   Osteoarthritis 05/05/2010   Disorder of bone and cartilage 05/05/2010    PCP: Shepard Ade, MD   REFERRING PROVIDER: Persons, Ronal Dragon, [place]   REFERRING DIAG: Ref dx- [phone] (ICD-10-CM) - Pain of left hip  THERAPY DIAG:  Pain in left hip  Unsteadiness on feet  Muscle weakness (generalized)  Other abnormalities of gait and mobility  Rationale for Evaluation and Treatment: Rehabilitation  ONSET DATE: 2-3 weeks ago  SUBJECTIVE:   SUBJECTIVE STATEMENT: Pt reports she is able to do her HEP with some assistance going from lying down to sitting up. PERTINENT HISTORY: From referring MD visit--Plan: Findings consistent with trochanteric bursitis.  With regards to her back issues she knows she can go back and see Dr. Colon but right now she is in the middle of an extensive neurology workup and expects to be going to [facility] or [facility].  Cannot do an injection into her trochanteric bursa today because she is not supposed to have any steroids in her body.  We talked about topical medication.  Will also refer her to physical therapy she is at a higher risk because of her orthostatic hypotension  and multiple falls.  Hopefully they can do some modalities that will be helpful for her with regards to her left hip replacement looks to be in good position without evidence of loosening   PAIN:  Are you having pain? Yes: NPRS scale: 6/10 sharp grab 4/10 normally  Pain location: L lateral  Pain description: dull, sharp Aggravating factors: turning in bed, walking Relieving factors: medication  PRECAUTION:    *Orthostatic hypotension  RED FLAGS:     WEIGHT BEARING RESTRICTIONS: No  FALLS:  Has patient fallen in last 6 months? Falls around Nov-Dec  LIVING ENVIRONMENT: Lives with: lives with their spouse Lives in: House/apartment Stairs: No Has following equipment at home: Environmental consultant - 2 wheeled  OCCUPATION: retired  PLOF: Needs assistance with ADLs  PATIENT GOALS: Decrease hip pain  NEXT MD VISIT: Unknown  OBJECTIVE:  Note: Objective measures were completed at Evaluation unless otherwise noted.  DIAGNOSTIC FINDINGS: xray of hip shows no problems with prosthesis  PATIENT SURVEYS:  PSFS: THE PATIENT SPECIFIC FUNCTIONAL SCALE  Place score of 0-10 (0 = unable to perform activity and 10 = able to perform activity at the same level as before injury or problem)  Activity Date: 12/05/23    walking 4    2.dressing 7    3.transfers 5    4.      Total Score 5.33      Total Score = Sum of activity scores/number of activities  Minimally Detectable Change: 3 points (for single activity); 2 points (for average score)  [facility] (nd). The Patient Specific Functional Scale . Retrieved from [email or web address]   COGNITION: Overall cognitive status: Within functional limits for tasks assessed     SENSATION: WFL  MUSCLE LENGTH: Hamstrings:  Left 50 deg   POSTURE: rounded shoulders, forward head, decreased lumbar lordosis, and flexed trunk   PALPATION: 3T tenderness at L GT bursa  LOWER EXTREMITY  ROM:  Active ROM Right eval Left eval  Hip flexion  25  Hip extension    Hip abduction  40  Hip adduction    Hip internal rotation    Hip external rotation    Knee flexion    Knee extension    Ankle dorsiflexion  Ankle plantarflexion    Ankle inversion    Ankle eversion     (Blank rows = not tested)  LOWER EXTREMITY MMT:  MMT Right eval Left eval  Hip flexion  2+  Hip extension    Hip abduction  2+  Hip adduction  4  Hip internal rotation    Hip external rotation    Knee flexion    Knee extension    Ankle dorsiflexion  4  Ankle plantarflexion    Ankle inversion    Ankle eversion     (Blank rows = not tested)  LOWER EXTREMITY SPECIAL TESTS:  Hip special tests: Belvie (FABER) test: negative and Piriformis test: negative  FUNCTIONAL TESTS:  5 times sit to stand: Unable 2 completed in 50 sec  GAIT: Distance walked: household Assistive device utilized: Environmental consultant - 2 wheeled Level of assistance: SBA Comments:                                                                                                                                 TREATMENT DATE:  01/21/24 Neuro Re ed Seated 2# ball raises Seated toe raises 2x10 Seated ball squeezes 10x10 sec Seated T band hip abduction Green 3x10 There Ex PPT 2x10 Bridges  2x10 Supine heel lifts 2x10 Manual  Percussive device to L TFM  01/16/24 Neuro Re ed Seated 2# ball raises Seated toe raises 2x10 Seated ball squeezes 10x10 sec Seated T band hip abduction Green 3x10 There Ex PPT 2x10 Bridges  2x10 Supine heel lifts 2x10 Manual  Percussive device to L TFM   01/09/24 Neuro Re ed Seated 2# ball raises Seated toe raises 2x10 Seated ball squeezes 10x10 sec Seated T band hip abduction Green 3x10 There Ex PPT 2x10 Bridges  2x10 Supine heel lifts 2x10     01/06/25 Neuro Re ed Seated 2# ball raises Seated toe raises 2x10 Seated ball squeezes 10x10 sec There Ex PPT 2x10 Bridges  2x10 Supine heel  lifts 2x10   01/02/24 Seated 2# ball raises Seated toe raises 2x10 Seated ball squeezes 10x10 sec PPT 2x10 Bridges  2x10 Supine heel lifts 2x10   12/31/23  [id number] Seated toe raises 2x10 Seated ball squeezes 10x10 sec PPT 2x10 Bridges  2x10 Supine heel lifts 2x10  Manual LLE stretching and STM    12/26/23 Seated toe raises 2x10 Seated ball squeezes 10x10 sec PPT 2x10 Bridges  2x10 Supine heel lifts 2x10  Manual LLE stretching and STM  12/23/23  Seated toe raises 2x10 Seated ball squeezes 10x10 sec PPT 2x10 Bridges  2x10 Supine heel lifts 2x10  Manual LLE stretching and STM    12/05/23  Manual treatment - STM at TFL, manual hip ROM, Manual hamstring stretching Education/self care  PATIENT EDUCATION:  Education details: Education in transfer and ambulation tips to decrease irritating the hip  Person educated: Patient and Spouse Education method: Explanation, Demonstration, Tactile cues, and Verbal cues Education comprehension: verbalized understanding and  returned demonstration  HOME EXERCISE PROGRAM: Next visit  ASSESSMENT:  CLINICAL IMPRESSION:    Pt needed VC for proper foot position with bridging.  Demonstrated understanding. OBJECTIVE IMPAIRMENTS: Abnormal gait, decreased balance, decreased coordination, decreased mobility, difficulty walking, decreased ROM, decreased strength, dizziness, increased fascial restrictions, impaired flexibility, postural dysfunction, and pain.   ACTIVITY LIMITATIONS: sitting, standing, squatting, transfers, bed mobility, and dressing  PARTICIPATION LIMITATIONS: community activity  PERSONAL FACTORS: Age and 3+ comorbidities:   are also affecting patient's functional outcome.   REHAB POTENTIAL: Fair See above  CLINICAL DECISION MAKING: Stable/uncomplicated  EVALUATION COMPLEXITY: Moderate   GOALS: Goals reviewed with patient? Yes  SHORT TERM GOALS: Target date: 01/16/2024   Pt to be independent with  HEP. Baseline: Goal status: MET 01/16/24  2.  Decrease pain in left hip by 2 levels. Baseline: 8/10 Goal status: MET 01/16/24  LONG TERM GOALS: Target date: 02/27/2024    Decrease tenderness to 1+ at left GT Baseline:  Goal status: INITIAL  2.  Increase strength by 1/2 to 1 full grade of L LE.   Baseline: 2+/5 Goal status: INITIAL  3.  Increase hamstring flexibility by 50%. Baseline:  Goal status: INITIAL  4.  Ease pain to <5/10 in L hip with all activities. Baseline: 8/10 Goal status: INITIAL  5.  Improve PSFS score by at least 2 levels before discharge. Baseline: 5.33 Goal status: INITIAL   PLAN:  PT FREQUENCY: 1-2x/week  PT DURATION: 12 weeks  PLANNED INTERVENTIONS: [id number]- PT Re-evaluation, 97110-Therapeutic exercises, 97530- Therapeutic activity, [id number]- Neuromuscular re-education, 97535- Self Care, [id number]- Manual therapy, [phone]- Gait training, Patient/Family education, Joint mobilization, Cryotherapy, and Moist heat  PLAN FOR NEXT SESSION: Progress as tolerated.    Burnard CHRISTELLA Meth, PT 01/21/2024, 3:59 PM

## 2024-01-21 ENCOUNTER — Ambulatory Visit (INDEPENDENT_AMBULATORY_CARE_PROVIDER_SITE_OTHER)

## 2024-01-21 DIAGNOSIS — R2681 Unsteadiness on feet: Secondary | ICD-10-CM | POA: Diagnosis not present

## 2024-01-21 DIAGNOSIS — M6281 Muscle weakness (generalized): Secondary | ICD-10-CM

## 2024-01-21 DIAGNOSIS — R2689 Other abnormalities of gait and mobility: Secondary | ICD-10-CM

## 2024-01-21 DIAGNOSIS — M25552 Pain in left hip: Secondary | ICD-10-CM

## 2024-01-27 ENCOUNTER — Telehealth: Payer: Self-pay

## 2024-01-27 ENCOUNTER — Ambulatory Visit: Admitting: Neurology

## 2024-01-27 VITALS — BP 158/88 | HR 68 | Ht 67.0 in | Wt 152.0 lb

## 2024-01-27 DIAGNOSIS — G9089 Other disorders of autonomic nervous system: Secondary | ICD-10-CM

## 2024-01-27 DIAGNOSIS — M7989 Other specified soft tissue disorders: Secondary | ICD-10-CM | POA: Diagnosis not present

## 2024-01-27 NOTE — Progress Notes (Unsigned)
 ASSESSMENT AND PLAN  [name] is a 78 y.o. female   Panautonomic failure with subacute worsening since November 2024  Differentiation diagnosis of her autonomic failure including central nervous system degenerative disorder, such as pure autonomic failure, multiple system atrophy, (all Synucleinopath), versus autoimmune,  CND skin biopsy for synucleinopath in March 202: there is pathological evidence of phosphorylated alpha-synuclein deposition within the cutaneous nerves, this finding is consistent with a diagnosis of alpha-synuclein,   Laboratory evaluations showed negative paraneoplastic panel, ganglionic acetylcholine receptor autoantibody was positive NavigATTR Genetic testing for possible amyloidosis was reported negative.  Could not tolerate high dose of prednisone  60 mg decreased to 20 mg daily since February 2025, tolerating it well, was not sure about the benefit She started ivig in April 2025, tolerating it over all  She noticed most dramatic improvement of her symptoms since starting droxidopa  in March, she was on variable dosage currently taking midodrine  10 mg 3 times a day alternating with droxidopa  100 mg 3 times a day, Mestinon  30 mg daily, sometimes has supine hypertension,  Laboratory evaluations, including UA to rule out urinary tract infection New onset left lower extremities swelling, pain,  Left lower extremity Doppler study to rule out DVT      DIAGNOSTIC DATA (LABS, IMAGING, TESTING) - I reviewed patient records, labs, notes, testing and imaging myself where available. Skin biopsy result by [facility] Test: March 4th 2025, there is pathological evidence of phosphorylated alpha-synuclein deposition within the cutaneous nerves, this finding is consistent with a diagnosis of alpha-synuclein,  There is normal intraepidermal nerve fiber density, this does not exclude a small fiber neuropathy or neurodegenerative process  There is no pathological evidence  of amyloid deposit in cutaneous nerve,  MEDICAL HISTORY:  [name] is a 78 year old female, seen in request by neurosurgeon Dr. Colon Shove for evaluation of low back pain, leg weakness, her primary care physician is Dr. Shepard, Charlie, initial evaluation was on November 10, 2021  I reviewed and summarized the referring note.PMHX. S/p pacemaker. Histroy of right cervical radiculopathy, surgery did help.  Hx of bilateral hip replacement  In October 2022, after lifting a heavy box of books, she noticed low back pain, muscle spasm and lower back pain  She was initially treated with NSAIDs, muscle relaxant with some help  But since then, with prolonged standing, bending over she will develop low back pain, then was given steroid, physical therapy,  She had a history of bilateral hip replacement,  Over the past few months, she developed worsening low back pain, radiating pain to left lateral thigh region, she received injection for left hip bursitis, did not help her symptoms  CT of left hip in February 2023 showed total arthroplasty without hardware failure or complication  CT lumbar myelogram October 25, 2021 showed lumbar region disc bulging, facet osteoarthritis with mild narrowing of lateral recess at L3-4, L4-5 without definite neural compression,  CT thoracic spine showed chronic compression deformity of T3 no significant canal stenosis  She now complains of significant left lower extremity weakness, when she lying down sitting down no significant pain, when she getting up she complains of significant left leg pain 9 out of 10, radiating to the left lateral leg, deep aching pain at nighttime, has to take frequent over-the-counter medications  UPDATE August 16th 2023: Patient is accompanied by her husband return for electrodiagnostic study today, which showed evidence of mild peripheral neuropathy if anything, the test is also limited due to her lower  extremity pitting edema,  relative preserved distal lower extremity motor nerve conduction study  EMG nerve conduction study showed no evidence of active lumbar radiculopathy  On further questioning, today she mainly complains of subacute onset of bilateral lower extremity deep achy pain, mainly involving left lateral thigh,  now similar involvement of the right side, also complains of low back pain, upper extremity pain  She was treated with tapering dose of the steroid for 1 week by orthopedic surgeon for left hip pain, she did reported significant improvement afterwards, benefit last for more than 4 weeks, but she could not tolerate the side effect of high-dose of prednisone  after 60 mg daily,  She later had few sessions of epidural injection without any benefit  Today she also complains of worsening urinary urgency, to the point of incontinence,  She did have a history of cervical decompression surgery in the past, for severe right cervical radiculopathy, denied gait abnormality prior to her cervical decompression surgery  UPDATE Sept 5th 2023: She is accompanied by her husband at today's clinical visit, continues to complains of moderate left lateral thigh pain, right thigh pain, gait abnormality mainly due to limitation from pain  Reviewed laboratory evaluations normal high-sensitivity C-reactive protein ESR, CPK, B12, ANA, slightly low vitamin D  26.2  Reviewed home medication list, she has been on Lexapro  10 mg since age 69, she does suffer depression following total hysterectomy, she denies depression,  Personally reviewed CT cervical spine August 2023, status post ACDF C5-6 C6-7, no significant canal foraminal narrowing  CT lumbar myelogram October 25, 2021, multilevel degenerative changes, most obvious degenerative changes worse at L4-5, facet and ligamentous hypertrophy, mild stenosis of lateral recess but without definite neural compression  Husband revealed that she has become very sedentary since 2022,  likely deconditioned  Virtual Visit via video August 23 2022:  She was setup for physical therapy following last visit, but shortly after that she was called in to take care of her daughter at [place] , who is facing a lots of surgery, hiatal hernia, SI joint fusion, now needing shoulder replacement.  She is very struggling with her condition mentally and physically.  Continue complains of gait abnormality, intermittent body achy pain involving her lower extremity, knees, low back, frequent muscle spasm, sometimes leg gave out underneath her, like a rubber, risk for fall,  Update June 04 2023: She presented to emergency room on April 09, 2023 for significant headache, speech difficulty, dizziness, right-sided weakness, the headache was severe, sharp, at the right periorbital region,  Was diagnosed with complex migraine versus TIA,  Had MRI of the brain, which showed no acute abnormality, moderate atrophy, small vessel disease,  CT angiogram of head and neck showed no large vessel disease  Laboratory evaluation showed normal CBC, INR, CMP showed low potassium of 3.2, LDL 53, A1c of 5.7  Patient reported ever since then, she has significant worsening of her dizziness, profound orthostatic blood pressure change, fainting when standing up, shortness of breath walking across the room, lack of appetite, 20 pound weight loss over the past few months, loss of the taste,  She no longer have headaches, but profound orthostatic hypotension is very disability eating for her  She has been under the care of cardiologist Dr. Fernande for many years, who reported that she has insidious onset of orthostatic hypotension since 2015, also had cardiac block required pacing since 2014, previously complicated by microperforation, moderate effusion, then Adderall withdrawal, complete heart block, required pacemaker, generator replacement October 2024, seen  by GI specialist for collagenous colitis, presenting with  frequent diarrhea,  She also had excessive stress over the past few years, took care of her daughter at [place] , who passed away in 02-03-2023 She was started on midodrine  as far back as 03/05/2014, was on 2.5 mg, significant increase of dosage up to 50 mg 3 times a day with limited help, also on Mestinon  since October 2024, 60 mg trigger significant GI side effect, could only tolerate 30 mg twice a day, fludrocortisone  0.1 mg daily since October  UPDATE Jan 27th 2025:  I reviewed also discussed with her cardiologist Dr. Fernande  She has long history of gradual onset autonomic symptoms, mild progressive worsening orthostatic hypotension since 03/05/14, cardiac block, required pacemaker,  Patient also reported a diagnosis of collagenous colitis under the care of of [facility] specialist, presenting with watery diarrhea, intermixed with occasionally constipation,  She has been treated with budesonide  for extended period of time, with resolution of her symptoms, Long history of decreased sweaty, heat intolerance, dry eye, dry mouth In addition, she reported that her daughter who has passed away in fall of 2023/03/06 has a lot of similar symptoms Her history is suggestive of panautonomic failure, involving sympathetic noradrenergic, sympathetic cholinergic, likely parasympathetic involvement as well Etiology including Central nervous system degenerative disorder such as pure autonomic failure, versus multiple system atrophy, with her reported history of acute worsening since October 2024, raise the possibility of autoimmune process, also need to rule out amyloid deposit  Planning on: 1.CND skin biopsy for synucleinopath 2.  Laboratory evaluations were , including paraneoplastic panel, ganglionic acetylcholine receptor autoantibody, 3.NavigATTR Genetic testing for possible amyloidosis 4. She is on higher dose of midodrine  up to 20 mg 4 times a day, Mestinon  60 mg half tablets twice a day, also on fludrocortisone ,  new prescription of droxidopa   UPDATE July 16 2023: She is accompanied by her husband at today's visit, continue to have worsening symptoms, can barely standing up, even transfer from house to car would cause fainting spells, today's examination showed significant orthostatic hypotension, from lying down 136/76 heart rate of 57 to standing up 55/38 heart rate of 77  She is already on Mestinon  60 mg half tablets twice a day, midodrine  up to 20 mg 4 times a day, fludrocortisone  0.1 mg daily, droxidopa  was prescribed, husband is frustrated about insurance prior authorization process, have not get the medication yet  Extensive laboratory evaluation showed positive ganglionic acetylcholine receptor antibody, 0.08, along with her subacute worsening with her chronic orthostatic hypotension suggest autoimmune component in her pan-autonomic failure   Paraneoplastic panel was negative,   Started prior authorization for IVIG on July 02, 2023, loading dose 2 g/kg followed by 1 g/kg every 3 weeks   I gave her prescription of prednisone  60 mg daily for 2 weeks, then 50 mg for 2 weeks, then 40 mg daily, she only tried prednisone  60 mg once, complains of significant GI side effect, difficulty sleeping, did not continued on it  She now have significant nausea, poor appetite, not sweat, diarrhea, consistent with pan autonomic failure  UPDATE May 8th 2025: Skin biopsy was positive by [facility] in march 2025, there is pathological evidence of phosphorylated alpha-synuclein deposition within the cutaneous nerves, this finding is consistent with a diagnosis of alpha-synuclein,   She was given a trial of prednisone  60 mg, could not tolerated, decrease to 20 mg in February 2025, was not sure about the benefit, but she is treated with [facility]  for collagenous colitis, has been on stable dose of budesonide  XR 3 mg daily for many years, still have intermittent watery diarrhea, loose stool, since started prednisone , her  stool is much normal, well-formed,  IVIG since April 2025, overall tolerating it okay,  The most dramatic improvement after she started taking droxidopa  up to 100 mg 3 times a day since March,  She is now on Midodrine  10mg   3 times a day, she take it at 8am, 12, 4pm Droxidoma 100mg  at 9am, 2pm, 5pm,     Mestinon  60 mg half tablets every morning  Frequent nocturnal awakening, urged to urinate,  UPDATE Sept 15th 2025:  Could not stand up for more than 15 minutes, feel sick,  she ws helping with plant, laudrary, also with memory loss, she sleeps well most of night, head raised in bed, sometimes neck pain,   She does not have much appetite, lost her taste and smell,  gradually getting worse over past few years,   She has abdominal cramping, back on budesonide  3mg  did help her GI symptoms, has mild constipation, mira lax with juice around noon,   Ssytem 100-160, 50-80s,  Florineft 0.1mg  8am,  Droxidoma 100mg  at 7am, 9:45 am,1:30pm Midodrine  2.5mg  at 8am, 11:30, 4pm,  Mestinon  30mg  qam,     PHYSICAL EXAM:     01/27/2024    3:08 PM 12/17/2023    1:28 PM 10/25/2023    1:53 PM  Vitals with BMI  Height 5' 7 5' 7   Weight 152 lbs 163 lbs 159 lbs 8 oz  BMI 23.8 25.52 24.98  Systolic 158 144 843  Diastolic 88 76 85  Pulse 68 70 78    Sitting 151/83, HR79 Standing up: 118/69,hr83 Standing up x one minute,  131/71,  HR84,  MENTAL STATUS: Speech/cognition: Awake, alert, oriented to history taking and casual conversation CRANIAL NERVES: CN II: Visual fields are full to confrontation. Pupils are round equal and briskly reactive to light. CN III, IV, VI: extraocular movement are normal. No ptosis. CN V: Facial sensation is intact to light touch CN VII: Face is symmetric with normal eye closure  CN VIII: Hearing is normal to causal conversation. CN IX, X: Phonation is normal. CN XI: Head turning and shoulder shrug are intact  MOTOR: Moving 4 extremities without  difficulty  SENSORY: intact to light touch  COORDINATION: There is no trunk or limb dysmetria noted.  GAIT/STANCE: Very lightheaded standing up, with significant blood pressure drop, barely able to take any steps  REVIEW OF SYSTEMS:  Full 14 system review of systems performed and notable only for as above All other review of systems were negative.   ALLERGIES: Allergies  Allergen Reactions   Anesthetics, Amide Other (See Comments)    Patient unsure of names, however multiples cause swelling of airway & nausea   Clindamycin  Swelling   Shellfish Allergy Other (See Comments)    Neurotoxic reaction   Sulfonamide Derivatives Anaphylaxis and Swelling   Neomycin Swelling   Zolpidem  Tartrate Other (See Comments)    Jerking motions    Azithromycin  Other (See Comments)    Severe gastritis    Bactroban Other (See Comments)    Causes sores in nose   Ciprofloxacin     joint swelling   Codeine Swelling   Meperidine Hcl Nausea Only    Hallucinations    Morphine  Nausea Only    Hallucinations    Neosporin [Neomycin-Polymyxin-Gramicidin] Hives and Dermatitis    All topical orin's ointment   Nitrofurantoin   neuropathy in legs   Oxycodone -Acetaminophen      Dizziness  Other Reaction(s): Dizziness   Penicillins Other (See Comments)    Swelling in joints   Tramadol      Makes jerk   Latex Rash    HOME MEDICATIONS: Current Outpatient Medications  Medication Sig Dispense Refill   acetaminophen  (TYLENOL ) 500 MG tablet Take 1,000 mg by mouth 2 (two) times daily as needed for moderate pain.     apixaban  (ELIQUIS ) 5 MG TABS tablet Take 1 tablet (5 mg total) by mouth 2 (two) times daily. Start taking after completion of starter pack. 60 tablet 4   Bacitracin-Polymyxin B (POLYSPORIN) 500-10000 UNIT/GM OINT 1 application as needed Externally twice a day for 5 days     budesonide  (ENTOCORT EC ) 3 MG 24 hr capsule Take 3 mg by mouth daily. Every other day     Calcium  Carbonate-Vitamin D   (CALTRATE 600+D PO) Take 1 tablet by mouth daily.     cetirizine (ZYRTEC) 10 MG tablet Take 10 mg by mouth daily.     Cholecalciferol  (VITAMIN D ) 50 MCG (2000 UT) CAPS Take 2,000 Units by mouth daily.     cycloSPORINE (RESTASIS) 0.05 % ophthalmic emulsion Place 1 drop into both eyes 2 (two) times daily as needed (dry eyes).     diclofenac Sodium (VOLTAREN) 1 % GEL Apply 1 application  topically daily as needed (pain).     droxidopa  (NORTHERA ) 100 MG CAPS Take 1 capsule (100 mg total) by mouth 3 (three) times daily with meals.     DULoxetine  (CYMBALTA ) 60 MG capsule Take 60 mg by mouth daily.     fludrocortisone  (FLORINEF ) 0.1 MG tablet Take 1 tablet (0.1 mg total) by mouth daily. 270 tablet 3   folic acid (FOLVITE) 400 MCG tablet Take 400 mcg by mouth daily.     levothyroxine  (SYNTHROID ) 50 MCG tablet Take 50 mcg by mouth 3 (three) times a week. Take 50 mcg daily on Tuesday,Thursday, and Sunday     levothyroxine  (SYNTHROID ) 75 MCG tablet Take 75 mcg by mouth 4 (four) times a week. Pt takes on Monday,Wednesday,Friday, and Saturday     Magnesium  250 MG TABS Take 500 mg by mouth daily.     meclizine  (ANTIVERT ) 25 MG tablet Take 25 mg by mouth 3 (three) times daily as needed for dizziness.      Melatonin 5 MG CAPS Take 10 mg by mouth at bedtime.     midodrine  (PROAMATINE ) 10 MG tablet Take 1 tablet (10 mg total) by mouth 3 (three) times daily. Currently taking three times daily     Omega 3 1200 MG CAPS Take 1,200 mg by mouth daily.     potassium chloride  (KLOR-CON  M) 10 MEQ tablet Take 2 tablets (20 mEq total) by mouth daily. 180 tablet 3   Probiotic Product (ALIGN) 4 MG CAPS Take 4 mg by mouth at bedtime.      pyridostigmine  (MESTINON ) 60 MG tablet Take 0.5 tablets (30 mg total) by mouth daily.     rosuvastatin  (CRESTOR ) 10 MG tablet Take 1 tablet (10 mg total) by mouth daily. (Patient taking differently: Take 10 mg by mouth at bedtime.) 90 tablet 0   vitamin B-12 (CYANOCOBALAMIN ) 500 MCG tablet  Take 500 mcg by mouth daily.     aspirin  EC 81 MG EC tablet Take 1 tablet (81 mg total) by mouth daily. (Patient not taking: Reported on 01/27/2024)     No current facility-administered medications for this visit.   Facility-Administered Medications  Ordered in Other Visits  Medication Dose Route Frequency Provider Last Rate Last Admin   bupivacaine  (MARCAINE ) 0.5 % 10 mL, triamcinolone  acetonide (KENALOG -40) 40 mg injection   Subcutaneous Once Tannenbaum, Sigmund, MD       bupivacaine  (MARCAINE ) 0.5 % 15 mL, phenazopyridine  (PYRIDIUM ) 400 mg bladder mixture   Bladder Instillation Once Chales Idol, MD        PAST MEDICAL HISTORY: Past Medical History:  Diagnosis Date   ADHD (attention deficit hyperactivity disorder)    Allergy    trees/pollen, mold, fungus, dust mites. Takes allergy shots   Arthritis    PAIN AND OA LEFT HIP   Asthma    allergist Dr Kozlo- monthly allergy injections   Autonomic dysfunction    CENTRAL NERVOUS SYSTEM NEUROPATHY - DX BY DR. MAURICE MORE THAN 10 YRS AGO - AND IT IS FELT TO CONTRIBUTE TO THE AUTOMIC  DYSFUNCTION-- PT HAS NUMBNESS LEGS AND FEET AND SOMETIIMES TIPS OF FINGER, SEVERE CONSTIPATION( NO SENSATION TO HAVE BM ), DOUBLE VISION, ORTHOSTATIC HYPOTENSION   Blood transfusion    Breast cancer (HCC)    right side   Bruises easily    Cataract    Chest pain    a. 12/2012 Cath: nl cors, EF 55-65%.   Complication of anesthesia    reaction to some anesthetics/ 7/12 anesth record on chart- states prefers epidural   CVA (cerebrovascular accident) (HCC) 03/21/2018   Depression    Diverticulosis    Dysrhythmia    HX OF HIGH GRADE HEART BLOCK - REQUIRED PACEMAKER INSERTION   Esophageal stricture    Gastritis    GERD (gastroesophageal reflux disease)    H/O hiatal hernia    Hemorrhoids    Hernia of abdominal wall    spigelian hernia RLQ - SURGERY TO REPAIR   Hypothyroidism    Interstitial cystitis    Latex allergy, contact dermatitis    Mallory -  Weiss tear    HEALED    Neuromuscular disorder (HCC)    central nervous system neuropathy- seen per Dr MAURICE   Pacemaker    Pericardial effusion    a. 12/2012 following ppm placement;  b. 01/01/2013 Echo: EF 55-60%, small pericardial effusion w/o RV collapse-->No need for tap/window.   PONV (postoperative nausea and vomiting)    pt needs scop patch   Recurrent upper respiratory infection (URI) 1/13- to present   bronchitis following surgery- states improved but still with cough. OV with [facility] Dr Shepard 09/06/11 on chart   Shortness of breath    AT TIMES - BUT MUCH IMPROVED AFTER PACEMAKER WAS REPROGRAMED.   Symptomatic bradycardia    a. 12/2012 s/p MDT dual chamber PPM, ser # [id number]; b. 12/2012 post-op course complicated by pericardial effusinon req lead revision.   Thyroid  disease     PAST SURGICAL HISTORY: Past Surgical History:  Procedure Laterality Date   ABDOMINAL HYSTERECTOMY  1974   BACK SURGERY     cervical fusion 4-5 with plate   BLADDER SUSPENSION     BREAST BIOPSY  2002   NO BLOOD PRESSURES ON RIGHT SIDE/   s/p  axillary node dissection   CYSTO WITH HYDRODISTENSION  09/07/2011   Procedure: CYSTOSCOPY/HYDRODISTENSION;  Surgeon: Idol LILLETTE Chales, MD;  Location: [facility];  Service: Urology;  Laterality: N/A;  INSTILLATION OF MARCAINE /PYRIDIUM  INSTILLATION OF MARCAINE /KENALOG    CYSTOSCOPY  1975, 2007   EYE SURGERY     LASIK EYE SURGERY BILATERAL   LAPAROSCOPY  1973   LEAD REVISION N/A 12/30/2012  Procedure: LEAD REVISION;  Surgeon: Danelle LELON Birmingham, MD;  Location: [facility] CATH LAB;  Service: Cardiovascular;  Laterality: N/A;   LEFT HEART CATHETERIZATION WITH CORONARY ANGIOGRAM N/A 12/29/2012   Procedure: LEFT HEART CATHETERIZATION WITH CORONARY ANGIOGRAM;  Surgeon: Peter M Swaziland, MD;  Location: [facility] CATH LAB;  Service: Cardiovascular;  Laterality: N/A;   MASTECTOMY MODIFIED RADICAL     right; with immediate reconstruction   MYRINGOPLASTY  1962   NECK SURGERY     c4-5  ruptured disc   OOPHORECTOMY  1982   PACEMAKER INSERTION     PERMANENT PACEMAKER INSERTION N/A 12/29/2012   Procedure: PERMANENT PACEMAKER INSERTION;  Surgeon: Elspeth JAYSON Sage, MD;  Location: [facility] CATH LAB;  Service: Cardiovascular;  Laterality: N/A;   PPM GENERATOR CHANGEOUT N/A 02/27/2023   Procedure: PPM GENERATOR CHANGEOUT;  Surgeon: Sage Elspeth JAYSON, MD;  Location[facility] CV [facility];  Service: Cardiovascular;  Laterality: N/A;   RECTOCELE REPAIR     with cystocele repair   TONSILLECTOMY     TOTAL HIP ARTHROPLASTY Right    TOTAL HIP ARTHROPLASTY Left 06/12/2013   Procedure: LEFT TOTAL HIP ARTHROPLASTY ANTERIOR APPROACH;  Surgeon: Lonni CINDERELLA Poli, MD;  Location: [facility];  Service: Orthopedics;  Laterality: Left;   TYMPANOPLASTY  1973   right   ULNAR NERVE TRANSPOSITION Right    VENTRAL HERNIA REPAIR  05/30/2011   Procedure: HERNIA REPAIR VENTRAL ADULT;  Surgeon: Sherlean JINNY Laughter, MD;  Location:  SURGERY CENTER;  Service: General;  Laterality: Right;  repair right spigelian hernia    FAMILY HISTORY: Family History  Problem Relation Age of Onset   Heart disease Father    Colon cancer Mother 40   Depression Mother    Pancreatic cancer Sister 23   Diverticulitis Sister    Uterine cancer Maternal Grandmother    Heart disease Brother    Heart disease Paternal Grandmother    Heart disease Paternal Grandfather    Throat cancer Maternal Uncle    Heart disease Paternal Aunt    Heart disease Paternal Uncle    Heart disease Brother    Thrombosis Maternal Aunt    Cancer Maternal Uncle        NOS   Diabetes Other        aunt    SOCIAL HISTORY: Social History   Socioeconomic History   Marital status: Married    Spouse name: Not on file   Number of children: 2   Years of education: 15   Highest education level: Not on file  Occupational History   Occupation: Retired    Associate Professor: [employer]    Comment: vp corporate affairs [employer]     Employer: [employer]  Tobacco Use   Smoking status: Never   Smokeless tobacco: Never  Vaping Use   Vaping status: Never Used  Substance and Sexual Activity   Alcohol  use: Yes    Alcohol /week: 7.0 standard drinks of alcohol     Types: 7 Glasses of wine per week    Comment: occasional   Drug use: No   Sexual activity: Not on file  Other Topics Concern   Not on file  Social History Narrative   Lives at home w/ her husband   Patient drinks 1-2 cup of caffeine daily.   Patient is right handed.   Social Drivers of Corporate investment banker Strain: Not on file  Food Insecurity: No Food Insecurity (10/25/2023)   Hunger Vital Sign  Worried About Programme researcher, broadcasting/film/video in the Last Year: Never true    Ran Out of Food in the Last Year: Never true  Transportation Needs: No Transportation Needs (10/25/2023)   PRAPARE - Administrator, Civil Service (Medical): No    Lack of Transportation (Non-Medical): No  Physical Activity: Not on file  Stress: Not on file  Social Connections: Unknown (09/24/2021)   Received from [facility]   Social Network    Social Network: Not on file  Intimate Partner Violence: Not At Risk (10/25/2023)   Humiliation, Afraid, Rape, and Kick questionnaire    Fear of Current or Ex-Partner: No    Emotionally Abused: No    Physically Abused: No    Sexually Abused: No      Modena Callander, M.D. Ph.D.  [facility] [address] [place], [place] [address] Ph: [phone] Fax: [phone]  CC:  Shepard Ade, MD [address] [place],  [place] [address]  Shepard Ade, MD

## 2024-01-27 NOTE — Telephone Encounter (Signed)
 Called and spoke to gina [employer] who stated that she will fax back over the results since they weren't scanned into the pts chart and I couldn't;t locate a physical copy

## 2024-01-29 DIAGNOSIS — F320 Major depressive disorder, single episode, mild: Secondary | ICD-10-CM | POA: Diagnosis not present

## 2024-02-01 NOTE — Therapy (Signed)
 OUTPATIENT PHYSICAL THERAPY LOWER EXTREMITY TREATMENT/PROGRESS  Patient Name: [name] MRN: [id number] DOB:[birth year], 78 y.o., female Today's Date: 02/03/2024  END OF SESSION:    PT End of Session - 02/03/24 1649     Visit Number 10    Number of Visits 25    Date for Recertification  02/27/24    PT Start Time 1600    PT Stop Time 1640    PT Time Calculation (min) 40 min    Activity Tolerance Patient tolerated treatment well    Behavior During Therapy WFL for tasks assessed/performed                   Past Medical History:  Diagnosis Date   ADHD (attention deficit hyperactivity disorder)    Allergy    trees/pollen, mold, fungus, dust mites. Takes allergy shots   Arthritis    PAIN AND OA LEFT HIP   Asthma    allergist Dr Kozlo- monthly allergy injections   Autonomic dysfunction    CENTRAL NERVOUS SYSTEM NEUROPATHY - DX BY DR. MAURICE MORE THAN 10 YRS AGO - AND IT IS FELT TO CONTRIBUTE TO THE AUTOMIC  DYSFUNCTION-- PT HAS NUMBNESS LEGS AND FEET AND SOMETIIMES TIPS OF FINGER, SEVERE CONSTIPATION( NO SENSATION TO HAVE BM ), DOUBLE VISION, ORTHOSTATIC HYPOTENSION   Blood transfusion    Breast cancer (HCC)    right side   Bruises easily    Cataract    Chest pain    a. 12/2012 Cath: nl cors, EF 55-65%.   Complication of anesthesia    reaction to some anesthetics/ 7/12 anesth record on chart- states prefers epidural   CVA (cerebrovascular accident) (HCC) 03/21/2018   Depression    Diverticulosis    Dysrhythmia    HX OF HIGH GRADE HEART BLOCK - REQUIRED PACEMAKER INSERTION   Esophageal stricture    Gastritis    GERD (gastroesophageal reflux disease)    H/O hiatal hernia    Hemorrhoids    Hernia of abdominal wall    spigelian hernia RLQ - SURGERY TO REPAIR   Hypothyroidism    Interstitial cystitis    Latex allergy, contact dermatitis    Mallory - Weiss tear    HEALED    Neuromuscular disorder (HCC)    central nervous system neuropathy- seen per Dr  MAURICE   Pacemaker    Pericardial effusion    a. 12/2012 following ppm placement;  b. 01/01/2013 Echo: EF 55-60%, small pericardial effusion w/o RV collapse-->No need for tap/window.   PONV (postoperative nausea and vomiting)    pt needs scop patch   Recurrent upper respiratory infection (URI) 1/13- to present   bronchitis following surgery- states improved but still with cough. OV with [facility] Dr Shepard 09/06/11 on chart   Shortness of breath    AT TIMES - BUT MUCH IMPROVED AFTER PACEMAKER WAS REPROGRAMED.   Symptomatic bradycardia    a. 12/2012 s/p MDT dual chamber PPM, ser # [id number]; b. 12/2012 post-op course complicated by pericardial effusinon req lead revision.   Thyroid  disease    Past Surgical History:  Procedure Laterality Date   ABDOMINAL HYSTERECTOMY  1974   BACK SURGERY     cervical fusion 4-5 with plate   BLADDER SUSPENSION     BREAST BIOPSY  2002   NO BLOOD PRESSURES ON RIGHT SIDE/   s/p  axillary node dissection   CYSTO WITH HYDRODISTENSION  09/07/2011   Procedure: CYSTOSCOPY/HYDRODISTENSION;  Surgeon: Arlena LILLETTE Gal, MD;  Location: [facility];  Service: Urology;  Laterality: N/A;  INSTILLATION OF MARCAINE /PYRIDIUM  INSTILLATION OF MARCAINE /KENALOG    CYSTOSCOPY  1975, 2007   EYE SURGERY     LASIK EYE SURGERY BILATERAL   LAPAROSCOPY  1973   LEAD REVISION N/A 12/30/2012   Procedure: LEAD REVISION;  Surgeon: Danelle LELON Birmingham, MD;  Location[facility] C[facility] LAB;  Service: Cardiovascular;  Laterality: N/A;   LEFT HEART CATHETERIZATION WITH CORONARY ANGIOGRAM N/A 12/29/2012   Procedure: LEFT HEART CATHETERIZATION WITH CORONARY ANGIOGRAM;  Surgeon: Peter M Swaziland, MD;  Location: [facility] CATH LAB;  Service: Cardiovascular;  Laterality: N/A;   MASTECTOMY MODIFIED RADICAL     right; with immediate reconstruction   MYRINGOPLASTY  1962   NECK SURGERY     c4-5 ruptured disc   OOPHORECTOMY  1982   PACEMAKER INSERTION     PERMANENT PACEMAKER INSERTION N/A 12/29/2012   Procedure: PERMANENT  PACEMAKER INSERTION;  Surgeon: Elspeth JAYSON Sage, MD;  Location: [facility] CATH LAB;  Service: Cardiovascular;  Laterality: N/A;   PPM GENERATOR CHANGEOUT N/A 02/27/2023   Procedure: PPM GENERATOR CHANGEOUT;  Surgeon: Sage Elspeth JAYSON, MD;  Location: [facility] INVASIVE CV LAB;  Service: Cardiovascular;  Laterality: N/A;   RECTOCELE REPAIR     with cystocele repair   TONSILLECTOMY     TOTAL HIP ARTHROPLASTY Right    TOTAL HIP ARTHROPLASTY Left 06/12/2013   Procedure: LEFT TOTAL HIP ARTHROPLASTY ANTERIOR APPROACH;  Surgeon: Lonni CINDERELLA Poli, MD;  Location: [facility];  Service: Orthopedics;  Laterality: Left;   TYMPANOPLASTY  1973   right   ULNAR NERVE TRANSPOSITION Right    VENTRAL HERNIA REPAIR  05/30/2011   Procedure: HERNIA REPAIR VENTRAL ADULT;  Surgeon: Sherlean JINNY Laughter, MD;  Location: [facility];  Service: General;  Laterality: Right;  repair right spigelian hernia   Patient Active Problem List   Diagnosis Date Noted   Disorder of peripheral nervous system 12/05/2023   Disease related peripheral neuropathy 12/05/2023   Abnormal weight loss 10/21/2023   Deep vein thrombosis (DVT) of tibial vein of left lower extremity (HCC) 09/20/2023   Left leg swelling 09/19/2023   Urinary bladder disorder 07/01/2023   Insomnia 06/18/2023   Autonomic failure 06/04/2023   Focal neurological deficit 04/09/2023   Depression    Pain in both lower extremities 01/07/2022   Muscle ache 12/27/2021   Left lumbar radiculopathy 11/06/2021   Gait abnormality 11/06/2021   Trochanteric bursitis, left hip 05/04/2021   S/P Nissen fundoplication (without gastrostomy tube) procedure 03/01/2020   Pleuritic chest pain    Heart block AV complete (HCC) 05/19/2019   Chronic right shoulder pain 02/13/2017   Short Achilles tendon (acquired), left ankle 02/13/2017   Dizziness and giddiness 02/16/2016   Monoallelic mutation of CHEK2 gene 02/02/2015   Genetic testing 01/31/2015   Altered mental status 10/25/2014    Occipital neuralgia of left side    Cerebral infarction (HCC) brainstem s/p IV tPA (not seen on MRI) 10/07/2014   History of permanent cardiac pacemaker placement 10/07/2014   Cephalalgia    Complicated migraine    HLD (hyperlipidemia)    Headache 10/06/2014   Arthritis of left hip 06/12/2013   Status post THR (total hip replacement) 06/12/2013   Constipation 05/25/2013   Orthostatic hypotension 01/27/2013   Mechanical complication of cardiac pacemaker electrode 01/27/2013   Pre-syncope 01/02/2013   Sinus tachycardia 01/01/2013   Second degree heart block 12/27/2012   Hypokalemia 12/27/2012   Hypothyroidism 12/27/2012   Spigelian hernia 04/20/2011   DYSPNEA 05/09/2010  Malignant neoplasm of female breast (HCC) 05/05/2010   Asthma 05/05/2010   Diaphragmatic hernia 05/05/2010   Osteoarthritis 05/05/2010   Disorder of bone and cartilage 05/05/2010    PCP: Shepard Ade, MD   REFERRING PROVIDER: Shepard Ade, MD   REFERRING DIAG: Ref dx- M25.552 (ICD-10-CM) - Pain of left hip  THERAPY DIAG:  Pain in left hip  Unsteadiness on feet  Muscle weakness (generalized)  Other abnormalities of gait and mobility  Rationale for Evaluation and Treatment: Rehabilitation  ONSET DATE: 2-3 weeks ago  SUBJECTIVE:   SUBJECTIVE STATEMENT: Pt reports she is overwhelmed with organizing med records for her trip to a specialist soon.  Able to keep up with HEP though. PERTINENT HISTORY: From referring MD visit--Plan: Findings consistent with trochanteric bursitis.  With regards to her back issues she knows she can go back and see Dr. Colon but right now she is in the middle of an extensive neurology workup and expects to be going to [place] or the [place] clinic.  Cannot do an injection into her trochanteric bursa today because she is not supposed to have any steroids in her body.  We talked about topical medication.  Will also refer her to physical therapy she is at a higher risk  because of her orthostatic hypotension and multiple falls.  Hopefully they can do some modalities that will be helpful for her with regards to her left hip replacement looks to be in good position without evidence of loosening   PAIN:  Are you having pain? Yes: NPRS scale: 4/10 sharp grab 5/10  Pain location: L lateral  Pain description: dull, sharp Aggravating factors: turning in bed, walking Relieving factors: medication  PRECAUTION:    *Orthostatic hypotension  RED FLAGS:     WEIGHT BEARING RESTRICTIONS: No  FALLS:  Has patient fallen in last 6 months? Falls around Nov-Dec  LIVING ENVIRONMENT: Lives with: lives with their spouse Lives in: House/apartment Stairs: No Has following equipment at home: Environmental consultant - 2 wheeled  OCCUPATION: retired  PLOF: Needs assistance with ADLs  PATIENT GOALS: Decrease hip pain  NEXT MD VISIT: Unknown  OBJECTIVE:  Note: Objective measures were completed at Evaluation unless otherwise noted.  DIAGNOSTIC FINDINGS: xray of hip shows no problems with prosthesis  PATIENT SURVEYS:  PSFS: THE PATIENT SPECIFIC FUNCTIONAL SCALE  Place score of 0-10 (0 = unable to perform activity and 10 = able to perform activity at the same level as before injury or problem)  Activity Date: 12/05/23 Date: 02/03/24   walking 4 5   2.dressing 7 8   3.transfers 5 6   4.      Total Score 5.33 6.33     Total Score = Sum of activity scores/number of activities  Minimally Detectable Change: 3 points (for single activity); 2 points (for average score)  [facility] (nd). The Patient Specific Functional Scale . Retrieved from [email or web address]   COGNITION: Overall cognitive status: Within functional limits for tasks assessed     SENSATION: WFL  MUSCLE LENGTH: Hamstrings:  Left 50 deg   POSTURE: rounded shoulders, forward head, decreased lumbar lordosis, and flexed trunk   PALPATION: 3T  tenderness at L GT bursa.  02/03/24 Decreased to 2+  LOWER EXTREMITY ROM:  Active ROM Right eval Left eval Left  02/03/24  Hip flexion  25 30  Hip extension     Hip abduction  40 45  Hip adduction     Hip internal rotation     Hip external  rotation     Knee flexion     Knee extension     Ankle dorsiflexion     Ankle plantarflexion     Ankle inversion     Ankle eversion      (Blank rows = not tested)  LOWER EXTREMITY MMT:  MMT Right eval Left eval Date  02/03/24  Hip flexion  2+ 3  Hip extension     Hip abduction  2+ 3  Hip adduction  4 4  Hip internal rotation     Hip external rotation     Knee flexion     Knee extension     Ankle dorsiflexion  4 4  Ankle plantarflexion     Ankle inversion     Ankle eversion      (Blank rows = not tested)  LOWER EXTREMITY SPECIAL TESTS:  Hip special tests: Belvie (FABER) test: negative and Piriformis test: negative  FUNCTIONAL TESTS:  5 times sit to stand: Unable 2 completed in 50 sec  GAIT: Distance walked: household Assistive device utilized: Environmental consultant - 2 wheeled Level of assistance: SBA Comments:                                                                                                                                 TREATMENT DATE:  02/03/24 Neuro Re ed Seated 2# ball raises Seated toe raises 2x10 Seated ball squeezes 10x10 sec Seated T band hip abduction Green 3x10 There Ex PPT 2x10 Bridges  2x10 Supine heel lifts 2x10 Manual  Percussive device to L TFM   01/21/24 Neuro Re ed Seated 2# ball raises Seated toe raises 2x10 Seated ball squeezes 10x10 sec Seated T band hip abduction Green 3x10 There Ex PPT 2x10 Bridges  2x10 Supine heel lifts 2x10 Manual  Percussive device to L TFM  01/16/24 Neuro Re ed Seated 2# ball raises Seated toe raises 2x10 Seated ball squeezes 10x10 sec Seated T band hip abduction Green 3x10 There Ex PPT 2x10 Bridges  2x10 Supine heel lifts 2x10 Manual  Percussive  device to L TFM    12/05/23  Manual treatment - STM at TFL, manual hip ROM, Manual hamstring stretching Education/self care  PATIENT EDUCATION:  Education details: Education in transfer and ambulation tips to decrease irritating the hip  Person educated: Patient and Spouse Education method: Explanation, Demonstration, Tactile cues, and Verbal cues Education comprehension: verbalized understanding and returned demonstration  HOME EXERCISE PROGRAM: Next visit  ASSESSMENT:  CLINICAL IMPRESSION:    Pt has been in PT for 10 visits and improved with pain control, tenderness and strength.  She has completed STG but still has not completed LTG. Tenderness has not resolved completely.  She would benefit from continued skilled PT to address these deficits. OBJECTIVE IMPAIRMENTS: Abnormal gait, decreased balance, decreased coordination, decreased mobility, difficulty walking, decreased ROM, decreased strength, dizziness, increased fascial restrictions, impaired flexibility, postural dysfunction, and pain.   ACTIVITY LIMITATIONS: sitting, standing, squatting,  transfers, bed mobility, and dressing  PARTICIPATION LIMITATIONS: community activity  PERSONAL FACTORS: Age and 3+ comorbidities:   are also affecting patient's functional outcome.   REHAB POTENTIAL: Fair See above  CLINICAL DECISION MAKING: Stable/uncomplicated  EVALUATION COMPLEXITY: Moderate   GOALS: Goals reviewed with patient? Yes  SHORT TERM GOALS: Target date: 01/16/2024   Pt to be independent with HEP. Baseline: Goal status: MET 01/16/24  2.  Decrease pain in left hip by 2 levels. Baseline: 8/10 Goal status: MET 01/16/24  LONG TERM GOALS: Target date: 02/27/2024    Decrease tenderness to 1+ at left GT Baseline:  Goal status: ONGOING 02/03/24  2.  Increase strength by 1/2 to 1 full grade of L LE.   Baseline: 2+/5 Goal status: ONGOING 02/03/24  3.  Increase hamstring flexibility by 50%. Baseline:  Goal status:  ONGOING 02/03/24  4.  Ease pain to <5/10 in L hip with all activities. Baseline: 8/10 Goal status: ONGOING 02/03/24  5.  Improve PSFS score by at least 2 levels before discharge. Baseline: 5.33 Goal status:ONGOING 02/03/24  PLAN:  PT FREQUENCY: 1-2x/week  PT DURATION: 12 weeks  PLANNED INTERVENTIONS: [id number]- PT Re-evaluation, 97110-Therapeutic exercises, 97530- Therapeutic activity, 97112- Neuromuscular re-education, 97535- Self Care, [id number]- Manual therapy, [phone]- Gait training, Patient/Family education, Joint mobilization, Cryotherapy, and Moist heat  PLAN FOR NEXT SESSION: Progress as tolerated.    Burnard CHRISTELLA Meth, PT 02/03/2024, 4:56 PM

## 2024-02-03 ENCOUNTER — Ambulatory Visit (INDEPENDENT_AMBULATORY_CARE_PROVIDER_SITE_OTHER)

## 2024-02-03 DIAGNOSIS — R2681 Unsteadiness on feet: Secondary | ICD-10-CM

## 2024-02-03 DIAGNOSIS — R2689 Other abnormalities of gait and mobility: Secondary | ICD-10-CM

## 2024-02-03 DIAGNOSIS — M6281 Muscle weakness (generalized): Secondary | ICD-10-CM

## 2024-02-03 DIAGNOSIS — M25552 Pain in left hip: Secondary | ICD-10-CM | POA: Diagnosis not present

## 2024-02-09 NOTE — Therapy (Incomplete)
 OUTPATIENT PHYSICAL THERAPY LOWER EXTREMITY TREATMENT  Patient Name: [name] MRN: [id number] DOB:04/22/1946, 78 y.o., female Today's Date: 02/09/2024  END OF SESSION:***               Past Medical History:  Diagnosis Date   ADHD (attention deficit hyperactivity disorder)    Allergy    trees/pollen, mold, fungus, dust mites. Takes allergy shots   Arthritis    PAIN AND OA LEFT HIP   Asthma    allergist Dr Kozlo- monthly allergy injections   Autonomic dysfunction    CENTRAL NERVOUS SYSTEM NEUROPATHY - DX BY DR. MAURICE MORE THAN 10 YRS AGO - AND IT IS FELT TO CONTRIBUTE TO THE AUTOMIC  DYSFUNCTION-- PT HAS NUMBNESS LEGS AND FEET AND SOMETIIMES TIPS OF FINGER, SEVERE CONSTIPATION( NO SENSATION TO HAVE BM ), DOUBLE VISION, ORTHOSTATIC HYPOTENSION   Blood transfusion    Breast cancer (HCC)    right side   Bruises easily    Cataract    Chest pain    a. 12/2012 Cath: nl cors, EF 55-65%.   Complication of anesthesia    reaction to some anesthetics/ 7/12 anesth record on chart- states prefers epidural   CVA (cerebrovascular accident) (HCC) 03/21/2018   Depression    Diverticulosis    Dysrhythmia    HX OF HIGH GRADE HEART BLOCK - REQUIRED PACEMAKER INSERTION   Esophageal stricture    Gastritis    GERD (gastroesophageal reflux disease)    H/O hiatal hernia    Hemorrhoids    Hernia of abdominal wall    spigelian hernia RLQ - SURGERY TO REPAIR   Hypothyroidism    Interstitial cystitis    Latex allergy, contact dermatitis    Mallory - Weiss tear    HEALED    Neuromuscular disorder (HCC)    central nervous system neuropathy- seen per Dr MAURICE   Pacemaker    Pericardial effusion    a. 12/2012 following ppm placement;  b. 01/01/2013 Echo: EF 55-60%, small pericardial effusion w/o RV collapse-->No need for tap/window.   PONV (postoperative nausea and vomiting)    pt needs scop patch   Recurrent upper respiratory infection (URI) 1/13- to present   bronchitis  following surgery- states improved but still with cough. OV with [facility] Dr Shepard 09/06/11 on chart   Shortness of breath    AT TIMES - BUT MUCH IMPROVED AFTER PACEMAKER WAS REPROGRAMED.   Symptomatic bradycardia    a. 12/2012 s/p MDT dual chamber PPM, ser # [id number]; b. 12/2012 post-op course complicated by pericardial effusinon req lead revision.   Thyroid  disease    Past Surgical History:  Procedure Laterality Date   ABDOMINAL HYSTERECTOMY  1974   BACK SURGERY     cervical fusion 4-5 with plate   BLADDER SUSPENSION     BREAST BIOPSY  2002   NO BLOOD PRESSURES ON RIGHT SIDE/   s/p  axillary node dissection   CYSTO WITH HYDRODISTENSION  09/07/2011   Procedure: CYSTOSCOPY/HYDRODISTENSION;  Surgeon: Arlena LILLETTE Gal, MD;  Location: [facility];  Service: Urology;  Laterality: N/A;  INSTILLATION OF MARCAINE /PYRIDIUM  INSTILLATION OF MARCAINE /KENALOG    CYSTOSCOPY  1975, 2007   EYE SURGERY     LASIK EYE SURGERY BILATERAL   LAPAROSCOPY  1973   LEAD REVISION N/A 12/30/2012   Procedure: LEAD REVISION;  Surgeon: Danelle LELON Birmingham, MD;  Location[facility] CATH LAB;  Service: Cardiovascular;  Laterality: N/A;   LEFT HEART CATHETERIZATION WITH CORONARY ANGIOGRAM N/A 12/29/2012   Procedure: LEFT  HEART CATHETERIZATION WITH CORONARY ANGIOGRAM;  Surgeon: Peter M Swaziland, MD;  Location: [facility] CATH LAB;  Service: Cardiovascular;  Laterality: N/A;   MASTECTOMY MODIFIED RADICAL     right; with immediate reconstruction   MYRINGOPLASTY  1962   NECK SURGERY     c4-5 ruptured disc   OOPHORECTOMY  1982   PACEMAKER INSERTION     PERMANENT PACEMAKER INSERTION N/A 12/29/2012   Procedure: PERMANENT PACEMAKER INSERTION;  Surgeon: Elspeth JAYSON Sage, MD;  Location: [facility] CATH LAB;  Service: Cardiovascular;  Laterality: N/A;   PPM GENERATOR CHANGEOUT N/A 02/27/2023   Procedure: PPM GENERATOR CHANGEOUT;  Surgeon: Sage Elspeth JAYSON, MD;  Location: [facility] INVASIVE CV LAB;  Service: Cardiovascular;  Laterality: N/A;   RECTOCELE REPAIR      with cystocele repair   TONSILLECTOMY     TOTAL HIP ARTHROPLASTY Right    TOTAL HIP ARTHROPLASTY Left 06/12/2013   Procedure: LEFT TOTAL HIP ARTHROPLASTY ANTERIOR APPROACH;  Surgeon: Lonni CINDERELLA Poli, MD;  Location: [facility];  Service: Orthopedics;  Laterality: Left;   TYMPANOPLASTY  1973   right   ULNAR NERVE TRANSPOSITION Right    VENTRAL HERNIA REPAIR  05/30/2011   Procedure: HERNIA REPAIR VENTRAL ADULT;  Surgeon: Sherlean JINNY Laughter, MD;  Location: [facility];  Service: General;  Laterality: Right;  repair right spigelian hernia   Patient Active Problem List   Diagnosis Date Noted   Disorder of peripheral nervous system 12/05/2023   Disease related peripheral neuropathy 12/05/2023   Abnormal weight loss 10/21/2023   Deep vein thrombosis (DVT) of tibial vein of left lower extremity (HCC) 09/20/2023   Left leg swelling 09/19/2023   Urinary bladder disorder 07/01/2023   Insomnia 06/18/2023   Autonomic failure 06/04/2023   Focal neurological deficit 04/09/2023   Depression    Pain in both lower extremities 01/07/2022   Muscle ache 12/27/2021   Left lumbar radiculopathy 11/06/2021   Gait abnormality 11/06/2021   Trochanteric bursitis, left hip 05/04/2021   S/P Nissen fundoplication (without gastrostomy tube) procedure 03/01/2020   Pleuritic chest pain    Heart block AV complete (HCC) 05/19/2019   Chronic right shoulder pain 02/13/2017   Short Achilles tendon (acquired), left ankle 02/13/2017   Dizziness and giddiness 02/16/2016   Monoallelic mutation of CHEK2 gene 02/02/2015   Genetic testing 01/31/2015   Altered mental status 10/25/2014   Occipital neuralgia of left side    Cerebral infarction (HCC) brainstem s/p IV tPA (not seen on MRI) 10/07/2014   History of permanent cardiac pacemaker placement 10/07/2014   Cephalalgia    Complicated migraine    HLD (hyperlipidemia)    Headache 10/06/2014   Arthritis of left hip 06/12/2013   Status post THR (total hip  replacement) 06/12/2013   Constipation 05/25/2013   Orthostatic hypotension 01/27/2013   Mechanical complication of cardiac pacemaker electrode 01/27/2013   Pre-syncope 01/02/2013   Sinus tachycardia 01/01/2013   Second degree heart block 12/27/2012   Hypokalemia 12/27/2012   Hypothyroidism 12/27/2012   Spigelian hernia 04/20/2011   DYSPNEA 05/09/2010   Malignant neoplasm of female breast (HCC) 05/05/2010   Asthma 05/05/2010   Diaphragmatic hernia 05/05/2010   Osteoarthritis 05/05/2010   Disorder of bone and cartilage 05/05/2010    PCP: Shepard Ade, MD   REFERRING PROVIDER: Shepard Ade, MD   REFERRING DIAG: Ref dx- M25.552 (ICD-10-CM) - Pain of left hip  THERAPY DIAG:  No diagnosis found.  Rationale for Evaluation and Treatment: Rehabilitation  ONSET DATE: 2-3 weeks ago  SUBJECTIVE:   SUBJECTIVE STATEMENT: ***ASK about travel dates- LTG 10/16   Pt reports she is overwhelmed with organizing med records for her trip to a specialist soon.  Able to keep up with HEP though. PERTINENT HISTORY: From referring MD visit--Plan: Findings consistent with trochanteric bursitis.  With regards to her back issues she knows she can go back and see Dr. Colon but right now she is in the middle of an extensive neurology workup and expects to be going to [facility] or [facility].  Cannot do an injection into her trochanteric bursa today because she is not supposed to have any steroids in her body.  We talked about topical medication.  Will also refer her to physical therapy she is at a higher risk because of her orthostatic hypotension and multiple falls.  Hopefully they can do some modalities that will be helpful for her with regards to her left hip replacement looks to be in good position without evidence of loosening   PAIN:  ***Are you having pain? Yes: NPRS scale: 4/10 sharp grab 5/10  Pain location: L lateral  Pain description: dull, sharp Aggravating factors:  turning in bed, walking Relieving factors: medication  PRECAUTION:    *Orthostatic hypotension  RED FLAGS:     WEIGHT BEARING RESTRICTIONS: No  FALLS:  Has patient fallen in last 6 months? Falls around Nov-Dec  LIVING ENVIRONMENT: Lives with: lives with their spouse Lives in: House/apartment Stairs: No Has following equipment at home: Environmental consultant - 2 wheeled  OCCUPATION: retired  PLOF: Needs assistance with ADLs  PATIENT GOALS: Decrease hip pain  NEXT MD VISIT: Unknown  OBJECTIVE:  Note: Objective measures were completed at Evaluation unless otherwise noted.  DIAGNOSTIC FINDINGS: xray of hip shows no problems with prosthesis  PATIENT SURVEYS:  PSFS: THE PATIENT SPECIFIC FUNCTIONAL SCALE  Place score of 0-10 (0 = unable to perform activity and 10 = able to perform activity at the same level as before injury or problem)  Activity Date: 12/05/23 Date: 02/03/24   walking 4 5   2.dressing 7 8   3.transfers 5 6   4.      Total Score 5.33 6.33     Total Score = Sum of activity scores/number of activities  Minimally Detectable Change: 3 points (for single activity); 2 points (for average score)  [facility] (nd). The Patient Specific Functional Scale . Retrieved from [email or web address]   COGNITION: Overall cognitive status: Within functional limits for tasks assessed     SENSATION: WFL  MUSCLE LENGTH: Hamstrings:  Left 50 deg   POSTURE: rounded shoulders, forward head, decreased lumbar lordosis, and flexed trunk   PALPATION: 3T tenderness at L GT bursa.  02/03/24 Decreased to 2+  LOWER EXTREMITY ROM:  Active ROM Right eval Left eval Left  02/03/24  Hip flexion  25 30  Hip extension     Hip abduction  40 45  Hip adduction     Hip internal rotation     Hip external rotation     Knee flexion     Knee extension     Ankle dorsiflexion     Ankle plantarflexion     Ankle inversion     Ankle  eversion      (Blank rows = not tested)  LOWER EXTREMITY MMT:  MMT Right eval Left eval Date  02/03/24  Hip flexion  2+ 3  Hip extension     Hip abduction  2+ 3  Hip adduction  4 4  Hip internal rotation     Hip external rotation     Knee flexion     Knee extension     Ankle dorsiflexion  4 4  Ankle plantarflexion     Ankle inversion     Ankle eversion      (Blank rows = not tested)  LOWER EXTREMITY SPECIAL TESTS:  Hip special tests: Belvie (FABER) test: negative and Piriformis test: negative  FUNCTIONAL TESTS:  5 times sit to stand: Unable 2 completed in 50 sec  GAIT: Distance walked: household Assistive device utilized: Environmental consultant - 2 wheeled Level of assistance: SBA Comments:                                                                                                                                 TREATMENT DATE:  02/10/24***     02/03/24 Neuro Re ed Seated 2# ball raises Seated toe raises 2x10 Seated ball squeezes 10x10 sec Seated T band hip abduction Green 3x10 There Ex PPT 2x10 Bridges  2x10 Supine heel lifts 2x10 Manual  Percussive device to L TFM   01/21/24 Neuro Re ed Seated 2# ball raises Seated toe raises 2x10 Seated ball squeezes 10x10 sec Seated T band hip abduction Green 3x10 There Ex PPT 2x10 Bridges  2x10 Supine heel lifts 2x10 Manual  Percussive device to L TFM  01/16/24 Neuro Re ed Seated 2# ball raises Seated toe raises 2x10 Seated ball squeezes 10x10 sec Seated T band hip abduction Green 3x10 There Ex PPT 2x10 Bridges  2x10 Supine heel lifts 2x10 Manual  Percussive device to L TFM    12/05/23  Manual treatment - STM at TFL, manual hip ROM, Manual hamstring stretching Education/self care  PATIENT EDUCATION:  Education details: Education in transfer and ambulation tips to decrease irritating the hip  Person educated: Patient and Spouse Education method: Programmer, multimedia, Demonstration, Tactile cues, and Verbal  cues Education comprehension: verbalized understanding and returned demonstration  HOME EXERCISE PROGRAM: Next visit  ASSESSMENT:  CLINICAL IMPRESSION:    ***Pt has been in PT for 10 visits and improved with pain control, tenderness and strength.  She has completed STG but still has not completed LTG. Tenderness has not resolved completely.  She would benefit from continued skilled PT to address these deficits. OBJECTIVE IMPAIRMENTS: Abnormal gait, decreased balance, decreased coordination, decreased mobility, difficulty walking, decreased ROM, decreased strength, dizziness, increased fascial restrictions, impaired flexibility, postural dysfunction, and pain.   ACTIVITY LIMITATIONS: sitting, standing, squatting, transfers, bed mobility, and dressing  PARTICIPATION LIMITATIONS: community activity  PERSONAL FACTORS: Age and 3+ comorbidities:   are also affecting patient's functional outcome.   REHAB POTENTIAL: Fair See above  CLINICAL DECISION MAKING: Stable/uncomplicated  EVALUATION COMPLEXITY: Moderate   GOALS: Goals reviewed with patient? Yes  SHORT TERM GOALS: Target date: 01/16/2024   Pt to be independent with HEP. Baseline: Goal status: MET 01/16/24  2.  Decrease pain in left hip by 2 levels.  Baseline: 8/10 Goal status: MET 01/16/24  LONG TERM GOALS: Target date: 02/27/2024    Decrease tenderness to 1+ at left GT Baseline:  Goal status: ONGOING 02/03/24  2.  Increase strength by 1/2 to 1 full grade of L LE.   Baseline: 2+/5 Goal status: ONGOING 02/03/24  3.  Increase hamstring flexibility by 50%. Baseline:  Goal status: ONGOING 02/03/24  4.  Ease pain to <5/10 in L hip with all activities. Baseline: 8/10 Goal status: ONGOING 02/03/24  5.  Improve PSFS score by at least 2 levels before discharge. Baseline: 5.33 Goal status:ONGOING 02/03/24  PLAN:  PT FREQUENCY: 1-2x/week  PT DURATION: 12 weeks  PLANNED INTERVENTIONS: [id number]- PT Re-evaluation,  97110-Therapeutic exercises, 97530- Therapeutic activity, 97112- Neuromuscular re-education, 97535- Self Care, [id number]- Manual therapy, [phone]- Gait training, Patient/Family education, Joint mobilization, Cryotherapy, and Moist heat  PLAN FOR NEXT SESSION: ***Progress as tolerated.    Burnard CHRISTELLA Meth, PT 02/09/2024, 10:34 AM

## 2024-02-10 ENCOUNTER — Encounter

## 2024-02-13 DIAGNOSIS — R413 Other amnesia: Secondary | ICD-10-CM | POA: Diagnosis not present

## 2024-02-13 DIAGNOSIS — R531 Weakness: Secondary | ICD-10-CM | POA: Diagnosis not present

## 2024-02-13 DIAGNOSIS — I5032 Chronic diastolic (congestive) heart failure: Secondary | ICD-10-CM | POA: Diagnosis not present

## 2024-02-13 DIAGNOSIS — I951 Orthostatic hypotension: Secondary | ICD-10-CM | POA: Diagnosis not present

## 2024-02-13 DIAGNOSIS — G903 Multi-system degeneration of the autonomic nervous system: Secondary | ICD-10-CM | POA: Diagnosis not present

## 2024-02-18 NOTE — Progress Notes (Signed)
 Remote PPM Transmission

## 2024-02-20 ENCOUNTER — Ambulatory Visit (INDEPENDENT_AMBULATORY_CARE_PROVIDER_SITE_OTHER)

## 2024-02-20 DIAGNOSIS — M25552 Pain in left hip: Secondary | ICD-10-CM

## 2024-02-20 DIAGNOSIS — R2681 Unsteadiness on feet: Secondary | ICD-10-CM | POA: Diagnosis not present

## 2024-02-20 DIAGNOSIS — R2689 Other abnormalities of gait and mobility: Secondary | ICD-10-CM

## 2024-02-20 DIAGNOSIS — M6281 Muscle weakness (generalized): Secondary | ICD-10-CM

## 2024-02-20 NOTE — Therapy (Addendum)
 OUTPATIENT PHYSICAL THERAPY LOWER EXTREMITY TREATMENT/DISCHARGE  Patient Name: [name] MRN: [id number] DOB:1946/04/20, 78 y.o., female Today's Date: 02/20/2024  END OF SESSION:    PT End of Session - 02/20/24 1612     Visit Number 11    Number of Visits 25    Date for Recertification  02/27/24    PT Start Time 1515    PT Stop Time 1545    PT Time Calculation (min) 30 min    Activity Tolerance Patient tolerated treatment well    Behavior During Therapy WFL for tasks assessed/performed            Past Medical History:  Diagnosis Date   ADHD (attention deficit hyperactivity disorder)    Allergy    trees/pollen, mold, fungus, dust mites. Takes allergy shots   Arthritis    PAIN AND OA LEFT HIP   Asthma    allergist Dr Kozlo- monthly allergy injections   Autonomic dysfunction    CENTRAL NERVOUS SYSTEM NEUROPATHY - DX BY DR. MAURICE MORE THAN 10 YRS AGO - AND IT IS FELT TO CONTRIBUTE TO THE AUTOMIC  DYSFUNCTION-- PT HAS NUMBNESS LEGS AND FEET AND SOMETIIMES TIPS OF FINGER, SEVERE CONSTIPATION( NO SENSATION TO HAVE BM ), DOUBLE VISION, ORTHOSTATIC HYPOTENSION   Blood transfusion    Breast cancer (HCC)    right side   Bruises easily    Cataract    Chest pain    a. 12/2012 Cath: nl cors, EF 55-65%.   Complication of anesthesia    reaction to some anesthetics/ 7/12 anesth record on chart- states prefers epidural   CVA (cerebrovascular accident) (HCC) 03/21/2018   Depression    Diverticulosis    Dysrhythmia    HX OF HIGH GRADE HEART BLOCK - REQUIRED PACEMAKER INSERTION   Esophageal stricture    Gastritis    GERD (gastroesophageal reflux disease)    H/O hiatal hernia    Hemorrhoids    Hernia of abdominal wall    spigelian hernia RLQ - SURGERY TO REPAIR   Hypothyroidism    Interstitial cystitis    Latex allergy, contact dermatitis    Mallory - Weiss tear    HEALED    Neuromuscular disorder (HCC)    central nervous system neuropathy- seen per Dr MAURICE    Pacemaker    Pericardial effusion    a. 12/2012 following ppm placement;  b. 01/01/2013 Echo: EF 55-60%, small pericardial effusion w/o RV collapse-->No need for tap/window.   PONV (postoperative nausea and vomiting)    pt needs scop patch   Recurrent upper respiratory infection (URI) 1/13- to present   bronchitis following surgery- states improved but still with cough. OV with [facility] Dr Shepard 09/06/11 on chart   Shortness of breath    AT TIMES - BUT MUCH IMPROVED AFTER PACEMAKER WAS REPROGRAMED.   Symptomatic bradycardia    a. 12/2012 s/p MDT dual chamber PPM, ser # [id number]; b. 12/2012 post-op course complicated by pericardial effusinon req lead revision.   Thyroid  disease    Past Surgical History:  Procedure Laterality Date   ABDOMINAL HYSTERECTOMY  1974   BACK SURGERY     cervical fusion 4-5 with plate   BLADDER SUSPENSION     BREAST BIOPSY  2002   NO BLOOD PRESSURES ON RIGHT SIDE/   s/p  axillary node dissection   CYSTO WITH HYDRODISTENSION  09/07/2011   Procedure: CYSTOSCOPY/HYDRODISTENSION;  Surgeon: Arlena LILLETTE Gal, MD;  Location: [facility];  Service: Urology;  Laterality: N/A;  INSTILLATION OF MARCAINE /PYRIDIUM  INSTILLATION OF MARCAINE /KENALOG    CYSTOSCOPY  1975, 2007   EYE SURGERY     LASIK EYE SURGERY BILATERAL   LAPAROSCOPY  1973   LEAD REVISION N/A 12/30/2012   Procedure: LEAD REVISION;  Surgeon: Danelle LELON Birmingham, MD;  Location: [facility] CATH LAB;  Service: Cardiovascular;  Laterality: N/A;   LEFT HEART CATHETERIZATION WITH CORONARY ANGIOGRAM N/A 12/29/2012   Procedure: LEFT HEART CATHETERIZATION WITH CORONARY ANGIOGRAM;  Surgeon: Peter M Swaziland, MD;  Location: [facility] CATH LAB;  Service: Cardiovascular;  Laterality: N/A;   MASTECTOMY MODIFIED RADICAL     right; with immediate reconstruction   MYRINGOPLASTY  1962   NECK SURGERY     c4-5 ruptured disc   OOPHORECTOMY  1982   PACEMAKER INSERTION     PERMANENT PACEMAKER INSERTION N/A 12/29/2012   Procedure: PERMANENT PACEMAKER  INSERTION;  Surgeon: Elspeth JAYSON Sage, MD;  Location: [facility] CATH LAB;  Service: Cardiovascular;  Laterality: N/A;   PPM GENERATOR CHANGEOUT N/A 02/27/2023   Procedure: PPM GENERATOR CHANGEOUT;  Surgeon: Sage Elspeth JAYSON, MD;  Location: [facility] INVASIVE CV LAB;  Service: Cardiovascular;  Laterality: N/A;   RECTOCELE REPAIR     with cystocele repair   TONSILLECTOMY     TOTAL HIP ARTHROPLASTY Right    TOTAL HIP ARTHROPLASTY Left 06/12/2013   Procedure: LEFT TOTAL HIP ARTHROPLASTY ANTERIOR APPROACH;  Surgeon: Lonni CINDERELLA Poli, MD;  Location: [facility];  Service: Orthopedics;  Laterality: Left;   TYMPANOPLASTY  1973   right   ULNAR NERVE TRANSPOSITION Right    VENTRAL HERNIA REPAIR  05/30/2011   Procedure: HERNIA REPAIR VENTRAL ADULT;  Surgeon: Sherlean JINNY Laughter, MD;  Location: [facility];  Service: General;  Laterality: Right;  repair right spigelian hernia   Patient Active Problem List   Diagnosis Date Noted   Disorder of peripheral nervous system 12/05/2023   Disease related peripheral neuropathy 12/05/2023   Abnormal weight loss 10/21/2023   Deep vein thrombosis (DVT) of tibial vein of left lower extremity (HCC) 09/20/2023   Left leg swelling 09/19/2023   Urinary bladder disorder 07/01/2023   Insomnia 06/18/2023   Autonomic failure 06/04/2023   Focal neurological deficit 04/09/2023   Depression    Pain in both lower extremities 01/07/2022   Muscle ache 12/27/2021   Left lumbar radiculopathy 11/06/2021   Gait abnormality 11/06/2021   Trochanteric bursitis, left hip 05/04/2021   S/P Nissen fundoplication (without gastrostomy tube) procedure 03/01/2020   Pleuritic chest pain    Heart block AV complete (HCC) 05/19/2019   Chronic right shoulder pain 02/13/2017   Short Achilles tendon (acquired), left ankle 02/13/2017   Dizziness and giddiness 02/16/2016   Monoallelic mutation of CHEK2 gene 02/02/2015   Genetic testing 01/31/2015   Altered mental status 10/25/2014   Occipital  neuralgia of left side    Cerebral infarction (HCC) brainstem s/p IV tPA (not seen on MRI) 10/07/2014   History of permanent cardiac pacemaker placement 10/07/2014   Cephalalgia    Complicated migraine    HLD (hyperlipidemia)    Headache 10/06/2014   Arthritis of left hip 06/12/2013   Status post THR (total hip replacement) 06/12/2013   Constipation 05/25/2013   Orthostatic hypotension 01/27/2013   Mechanical complication of cardiac pacemaker electrode 01/27/2013   Pre-syncope 01/02/2013   Sinus tachycardia 01/01/2013   Second degree heart block 12/27/2012   Hypokalemia 12/27/2012   Hypothyroidism 12/27/2012   Spigelian hernia 04/20/2011   DYSPNEA 05/09/2010   Malignant neoplasm of female breast (  HCC) 05/05/2010   Asthma 05/05/2010   Diaphragmatic hernia 05/05/2010   Osteoarthritis 05/05/2010   Disorder of bone and cartilage 05/05/2010    PCP: Shepard Ade, MD   REFERRING PROVIDER: Persons, Ronal Dragon, [place]   REFERRING DIAG: Ref dx- [phone] (ICD-10-CM) - Pain of left hip  THERAPY DIAG:  Pain in left hip  Unsteadiness on feet  Muscle weakness (generalized)  Other abnormalities of gait and mobility  Rationale for Evaluation and Treatment: Rehabilitation  ONSET DATE: 2-3 weeks ago  SUBJECTIVE:   SUBJECTIVE STATEMENT: Pt states she bought a percussion gun for the L LE/hip.  It has been going well.   Just had numerous neuro tests in [place], at [facility] system.   Has Neurogenic hypotension.  Autonomic failure. Testing between MSA and Parkinsonism .  Going to [facility] for followups.   Overall the L hip pain is feeling much better.  PERTINENT HISTORY: From referring MD visit--Plan: Findings consistent with trochanteric bursitis.  With regards to her back issues she knows she can go back and see Dr. Colon but right now she is in the middle of an extensive neurology workup and expects to be going to [place] or the [place] clinic.  Cannot do an injection  into her trochanteric bursa today because she is not supposed to have any steroids in her body.  We talked about topical medication.  Will also refer her to physical therapy she is at a higher risk because of her orthostatic hypotension and multiple falls.  Hopefully they can do some modalities that will be helpful for her with regards to her left hip replacement looks to be in good position without evidence of loosening   PAIN:  Are you having pain? Yes: NPRS scale: 2/10 Pain location: L lateral  Pain description: dull, sharp Aggravating factors: turning in bed, walking Relieving factors: medication  PRECAUTION:    *Orthostatic hypotension  RED FLAGS:     WEIGHT BEARING RESTRICTIONS: No  FALLS:  Has patient fallen in last 6 months? Falls around Nov-Dec  LIVING ENVIRONMENT: Lives with: lives with their spouse Lives in: House/apartment Stairs: No Has following equipment at home: Environmental consultant - 2 wheeled  OCCUPATION: retired  PLOF: Needs assistance with ADLs  PATIENT GOALS: Decrease hip pain  NEXT MD VISIT: Unknown  OBJECTIVE:  Note: Objective measures were completed at Evaluation unless otherwise noted.  DIAGNOSTIC FINDINGS: xray of hip shows no problems with prosthesis  PATIENT SURVEYS:  PSFS: THE PATIENT SPECIFIC FUNCTIONAL SCALE  Place score of 0-10 (0 = unable to perform activity and 10 = able to perform activity at the same level as before injury or problem)  Activity Date: 12/05/23 Date: 02/03/24 Date: 02/20/24  walking 4 5 8   2.dressing 7 8 8   3.transfers 5 6 8   4.      Total Score 5.33 6.33 8    Total Score = Sum of activity scores/number of activities  Minimally Detectable Change: 3 points (for single activity); 2 points (for average score)  [facility] (nd). The Patient Specific Functional Scale . Retrieved from [email or web address]   COGNITION: Overall cognitive status: Within functional  limits for tasks assessed     SENSATION: WFL  MUSCLE LENGTH: Hamstrings:  Left 60 deg   POSTURE: rounded shoulders, forward head, decreased lumbar lordosis, and flexed trunk   PALPATION: 3T tenderness at L GT bursa.  02/03/24 Decreased to 2+  02/20/24  1+ tenderness .  LOWER EXTREMITY ROM:  Active ROM Right eval Left eval  Left  02/03/24 Left  02/20/24  Hip flexion  25 30 35  Hip extension      Hip abduction  40 45 45  Hip adduction      Hip internal rotation      Hip external rotation      Knee flexion      Knee extension      Ankle dorsiflexion      Ankle plantarflexion      Ankle inversion      Ankle eversion       (Blank rows = not tested)  LOWER EXTREMITY MMT:  MMT Right eval Left eval Date  02/03/24 Date 02/20/24  Hip flexion  2+ 3 3+  Hip extension      Hip abduction  2+ 3 3+  Hip adduction  4 4 4   Hip internal rotation      Hip external rotation      Knee flexion      Knee extension      Ankle dorsiflexion  4 4 4   Ankle plantarflexion      Ankle inversion      Ankle eversion       (Blank rows = not tested)  LOWER EXTREMITY SPECIAL TESTS:  Hip special tests: Belvie (FABER) test: negative and Piriformis test: negative  FUNCTIONAL TESTS:  5 times sit to stand: Unable 2 completed in 50 sec  GAIT: Distance walked: household Assistive device utilized: Environmental consultant - 2 wheeled Level of assistance: SBA Comments:                                                                                                                                 TREATMENT DATE:  02/20/24 Manual  Assessment for discharge  Manual stretching Self care for HEP, ADLs    02/03/24 Neuro Re ed Seated 2# ball raises Seated toe raises 2x10 Seated ball squeezes 10x10 sec Seated T band hip abduction Green 3x10 There Ex PPT 2x10 Bridges  2x10 Supine heel lifts 2x10 Manual  Percussive device to L TFM   01/21/24 Neuro Re ed Seated 2# ball raises Seated toe raises 2x10 Seated  ball squeezes 10x10 sec Seated T band hip abduction Green 3x10 There Ex PPT 2x10 Bridges  2x10 Supine heel lifts 2x10 Manual  Percussive device to L TFM  01/16/24 Neuro Re ed Seated 2# ball raises Seated toe raises 2x10 Seated ball squeezes 10x10 sec Seated T band hip abduction Green 3x10 There Ex PPT 2x10 Bridges  2x10 Supine heel lifts 2x10 Manual  Percussive device to L TFM    12/05/23  Manual treatment - STM at TFL, manual hip ROM, Manual hamstring stretching Education/self care  PATIENT EDUCATION:  Education details: Education in transfer and ambulation tips to decrease irritating the hip  Person educated: Patient and Spouse Education method: Explanation, Demonstration, Actor cues, and Verbal cues Education comprehension: verbalized understanding and returned demonstration  HOME EXERCISE PROGRAM:   ASSESSMENT:  CLINICAL IMPRESSION:    Pt has been in PT for 11 visits and continues to improve in pain control and be close to previous LOA.  She has completed STG and most LTG.  Tenderness has decreased from 3+ to 1+.   OBJECTIVE IMPAIRMENTS: Abnormal gait, decreased balance, decreased coordination, decreased mobility, difficulty walking, decreased ROM, decreased strength, dizziness, increased fascial restrictions, impaired flexibility, postural dysfunction, and pain.   ACTIVITY LIMITATIONS: sitting, standing, squatting, transfers, bed mobility, and dressing  PARTICIPATION LIMITATIONS: community activity  PERSONAL FACTORS: Age and 3+ comorbidities:   are also affecting patient's functional outcome.   REHAB POTENTIAL: Fair See above  CLINICAL DECISION MAKING: Stable/uncomplicated  EVALUATION COMPLEXITY: Moderate   GOALS: Goals reviewed with patient? Yes  SHORT TERM GOALS: Target date: 01/16/2024   Pt to be independent with HEP. Baseline: Goal status: MET 01/16/24  2.  Decrease pain in left hip by 2 levels. Baseline: 8/10 Goal status: MET 01/16/24  LONG  TERM GOALS: Target date: 02/27/2024    Decrease tenderness to 1+ at left GT Baseline:  Goal status: MET 02/20/24  2.  Increase strength by 1/2 to 1 full grade of L LE.   Baseline: 2+/5 Goal status: ONGOING 02/20/24  3.  Increase hamstring flexibility by 50%. Baseline:  Goal status: at 25%  4.  Ease pain to <5/10 in L hip with all activities. Baseline: 8/10 Goal status: MET 02/20/24  5.  Improve PSFS score by at least 2 levels before discharge. Baseline: 5.33 Goal status: MET 02/20/24 PLAN:  PT FREQUENCY: 1-2x/week  PT DURATION: 12 weeks  PLANNED INTERVENTIONS: 97164- PT Re-evaluation, 97110-Therapeutic exercises, 97530- Therapeutic activity, 97112- Neuromuscular re-education, 97535- Self Care, [id number]- Manual therapy, [phone]- Gait training, Patient/Family education, Joint mobilization, Cryotherapy, and Moist heat  PLAN FOR NEXT SESSION: DC to HEP. Most goals completed for L hip pain/bursitis.   Plan is to continue focus on neuro tests and diagnosis and possibly go to a neuro based rehab center after testing completed.   Burnard CHRISTELLA Meth, PT 02/20/2024, 4:14 PM

## 2024-02-24 DIAGNOSIS — Z23 Encounter for immunization: Secondary | ICD-10-CM | POA: Diagnosis not present

## 2024-02-24 DIAGNOSIS — I951 Orthostatic hypotension: Secondary | ICD-10-CM | POA: Diagnosis not present

## 2024-02-24 DIAGNOSIS — R269 Unspecified abnormalities of gait and mobility: Secondary | ICD-10-CM | POA: Diagnosis not present

## 2024-02-24 DIAGNOSIS — G43919 Migraine, unspecified, intractable, without status migrainosus: Secondary | ICD-10-CM | POA: Diagnosis not present

## 2024-02-24 DIAGNOSIS — I480 Paroxysmal atrial fibrillation: Secondary | ICD-10-CM | POA: Diagnosis not present

## 2024-02-24 DIAGNOSIS — R4189 Other symptoms and signs involving cognitive functions and awareness: Secondary | ICD-10-CM | POA: Diagnosis not present

## 2024-02-25 DIAGNOSIS — F320 Major depressive disorder, single episode, mild: Secondary | ICD-10-CM | POA: Diagnosis not present

## 2024-02-26 ENCOUNTER — Ambulatory Visit: Payer: Medicare Other

## 2024-02-26 DIAGNOSIS — K5909 Other constipation: Secondary | ICD-10-CM | POA: Diagnosis not present

## 2024-02-26 DIAGNOSIS — G901 Familial dysautonomia [Riley-Day]: Secondary | ICD-10-CM | POA: Diagnosis not present

## 2024-02-26 DIAGNOSIS — Z800 Family history of malignant neoplasm of digestive organs: Secondary | ICD-10-CM | POA: Diagnosis not present

## 2024-02-26 DIAGNOSIS — G903 Multi-system degeneration of the autonomic nervous system: Secondary | ICD-10-CM | POA: Diagnosis not present

## 2024-02-26 DIAGNOSIS — I441 Atrioventricular block, second degree: Secondary | ICD-10-CM

## 2024-02-26 DIAGNOSIS — K52831 Collagenous colitis: Secondary | ICD-10-CM | POA: Diagnosis not present

## 2024-02-27 ENCOUNTER — Encounter

## 2024-02-27 ENCOUNTER — Ambulatory Visit: Payer: Self-pay | Admitting: Cardiology

## 2024-02-27 LAB — CUP PACEART REMOTE DEVICE CHECK
Battery Remaining Longevity: 132 mo
Battery Voltage: 3.04 V
Brady Statistic AP VP Percent: 1.69 %
Brady Statistic AP VS Percent: 0 %
Brady Statistic AS VP Percent: 98.31 %
Brady Statistic AS VS Percent: 0 %
Brady Statistic RA Percent Paced: 1.68 %
Brady Statistic RV Percent Paced: 100 %
Date Time Interrogation Session: 20251015115821
Implantable Lead Connection Status: 753985
Implantable Lead Connection Status: 753985
Implantable Lead Implant Date: 20140818
Implantable Lead Implant Date: 20140818
Implantable Lead Location: 753859
Implantable Lead Location: 753860
Implantable Lead Model: 5076
Implantable Lead Model: 5076
Implantable Pulse Generator Implant Date: 20241016
Lead Channel Impedance Value: 304 Ohm
Lead Channel Impedance Value: 475 Ohm
Lead Channel Impedance Value: 532 Ohm
Lead Channel Impedance Value: 589 Ohm
Lead Channel Pacing Threshold Amplitude: 0.75 V
Lead Channel Pacing Threshold Amplitude: 1 V
Lead Channel Pacing Threshold Pulse Width: 0.4 ms
Lead Channel Pacing Threshold Pulse Width: 0.4 ms
Lead Channel Sensing Intrinsic Amplitude: 1.375 mV
Lead Channel Sensing Intrinsic Amplitude: 1.375 mV
Lead Channel Setting Pacing Amplitude: 2 V
Lead Channel Setting Pacing Amplitude: 2.5 V
Lead Channel Setting Pacing Pulse Width: 0.4 ms
Lead Channel Setting Sensing Sensitivity: 1.2 mV
Zone Setting Status: 755011
Zone Setting Status: 755011

## 2024-02-28 ENCOUNTER — Encounter: Payer: Self-pay | Admitting: Neurology

## 2024-03-02 NOTE — Progress Notes (Signed)
 Remote PPM Transmission

## 2024-03-05 ENCOUNTER — Encounter

## 2024-03-12 ENCOUNTER — Encounter

## 2024-03-13 DIAGNOSIS — Z23 Encounter for immunization: Secondary | ICD-10-CM | POA: Diagnosis not present

## 2024-03-16 ENCOUNTER — Encounter: Payer: Self-pay | Admitting: Radiology

## 2024-03-16 DIAGNOSIS — L218 Other seborrheic dermatitis: Secondary | ICD-10-CM | POA: Diagnosis not present

## 2024-03-16 DIAGNOSIS — L570 Actinic keratosis: Secondary | ICD-10-CM | POA: Diagnosis not present

## 2024-03-16 DIAGNOSIS — Z85828 Personal history of other malignant neoplasm of skin: Secondary | ICD-10-CM | POA: Diagnosis not present

## 2024-03-19 ENCOUNTER — Encounter

## 2024-03-20 ENCOUNTER — Other Ambulatory Visit

## 2024-03-23 ENCOUNTER — Other Ambulatory Visit

## 2024-03-23 ENCOUNTER — Ambulatory Visit: Admitting: Hematology and Oncology

## 2024-03-23 ENCOUNTER — Ambulatory Visit: Payer: Self-pay | Admitting: Cardiology

## 2024-03-23 ENCOUNTER — Ambulatory Visit: Payer: Medicare Other

## 2024-03-25 ENCOUNTER — Telehealth: Payer: Self-pay | Admitting: Student in an Organized Health Care Education/Training Program

## 2024-03-25 ENCOUNTER — Encounter

## 2024-03-25 NOTE — Telephone Encounter (Signed)
*  STAT* If patient is at the pharmacy, call can be transferred to refill team.   1. Which medications need to be refilled? (please list name of each medication and dose if known) pyridostigmine  (MESTINON ) 60 MG tablet    2. Would you like to learn more about the convenience, safety, & potential cost savings by using the [facility]?    3. Are you open to using the [facility] (Type [facility].  ).   4. Which pharmacy/location (including street and city if local pharmacy) is medication to be sent to? [facility] - [place], [place] - [address]    5. Do they need a 30 day or 90 day supply? 90 day

## 2024-03-26 ENCOUNTER — Other Ambulatory Visit: Payer: Self-pay | Admitting: Hematology and Oncology

## 2024-03-26 ENCOUNTER — Inpatient Hospital Stay: Admitting: Hematology and Oncology

## 2024-03-26 ENCOUNTER — Inpatient Hospital Stay: Attending: Hematology and Oncology

## 2024-03-26 VITALS — BP 137/74 | HR 67 | Temp 98.1°F | Resp 19 | Wt 161.7 lb

## 2024-03-26 DIAGNOSIS — I82542 Chronic embolism and thrombosis of left tibial vein: Secondary | ICD-10-CM

## 2024-03-26 DIAGNOSIS — Z800 Family history of malignant neoplasm of digestive organs: Secondary | ICD-10-CM | POA: Insufficient documentation

## 2024-03-26 DIAGNOSIS — Z7901 Long term (current) use of anticoagulants: Secondary | ICD-10-CM | POA: Insufficient documentation

## 2024-03-26 DIAGNOSIS — I82402 Acute embolism and thrombosis of unspecified deep veins of left lower extremity: Secondary | ICD-10-CM | POA: Diagnosis not present

## 2024-03-26 DIAGNOSIS — Z79899 Other long term (current) drug therapy: Secondary | ICD-10-CM | POA: Insufficient documentation

## 2024-03-26 DIAGNOSIS — I2699 Other pulmonary embolism without acute cor pulmonale: Secondary | ICD-10-CM | POA: Diagnosis not present

## 2024-03-26 DIAGNOSIS — Z808 Family history of malignant neoplasm of other organs or systems: Secondary | ICD-10-CM | POA: Diagnosis not present

## 2024-03-26 LAB — CBC WITH DIFFERENTIAL (CANCER CENTER ONLY)
Abs Immature Granulocytes: 0.02 K/uL (ref 0.00–0.07)
Basophils Absolute: 0 K/uL (ref 0.0–0.1)
Basophils Relative: 1 %
Eosinophils Absolute: 0.1 K/uL (ref 0.0–0.5)
Eosinophils Relative: 2 %
HCT: 37.3 % (ref 36.0–46.0)
Hemoglobin: 12.6 g/dL (ref 12.0–15.0)
Immature Granulocytes: 0 %
Lymphocytes Relative: 38 %
Lymphs Abs: 2.3 K/uL (ref 0.7–4.0)
MCH: 30.7 pg (ref 26.0–34.0)
MCHC: 33.8 g/dL (ref 30.0–36.0)
MCV: 91 fL (ref 80.0–100.0)
Monocytes Absolute: 0.5 K/uL (ref 0.1–1.0)
Monocytes Relative: 9 %
Neutro Abs: 3.1 K/uL (ref 1.7–7.7)
Neutrophils Relative %: 50 %
Platelet Count: 266 K/uL (ref 150–400)
RBC: 4.1 MIL/uL (ref 3.87–5.11)
RDW: 13.7 % (ref 11.5–15.5)
WBC Count: 6.1 K/uL (ref 4.0–10.5)
nRBC: 0 % (ref 0.0–0.2)

## 2024-03-26 LAB — CMP (CANCER CENTER ONLY)
ALT: 21 U/L (ref 0–44)
AST: 22 U/L (ref 15–41)
Albumin: 4.1 g/dL (ref 3.5–5.0)
Alkaline Phosphatase: 58 U/L (ref 38–126)
Anion gap: 6 (ref 5–15)
BUN: 14 mg/dL (ref 8–23)
CO2: 27 mmol/L (ref 22–32)
Calcium: 9.2 mg/dL (ref 8.9–10.3)
Chloride: 106 mmol/L (ref 98–111)
Creatinine: 0.83 mg/dL (ref 0.44–1.00)
GFR, Estimated: >60 mL/min (ref >60)
Glucose, Bld: 85 mg/dL (ref 70–99)
Potassium: 3.7 mmol/L (ref 3.5–5.1)
Sodium: 139 mmol/L (ref 135–145)
Total Bilirubin: 0.5 mg/dL (ref 0.0–1.2)
Total Protein: 6.5 g/dL (ref 6.5–8.1)

## 2024-03-26 NOTE — Progress Notes (Signed)
 [facility] [phone]   [phone]  PROGRESS NOTE  Patient Care Team: Shepard Ade, MD as PCP - General (Internal Medicine) Fernande Elspeth BROCKS, MD (Inactive) as PCP - Electrophysiology (Cardiology) Vincente Grip, MD as Consulting Physician (Psychiatry)  Hematological/Oncological History # Age Indeterminate LLE DVT  09/20/2023: US  of LLE showed findings consistent with age indeterminate deep vein thrombosis  involving the left posterior tibial veins, and left peroneal veins  10/25/2023: establish care with Dr. Federico   Interval History:  [name] 78 y.o. female with medical history significant for LLE DVT who presents for a follow up visit. The patient's last visit was on 10/25/2023 at which time she establish care. In the interim since the last visit she has had no major changes in her health.  On exam today Mr. Ebb reports that she has been well overall since our last visit 6 months ago.  She has been referred to [facility] and continues to be evaluated by their neurology team.  She reports that she is doing her best to try to titrate her medications so she can get up from her sitting position.  She reports that she runs the risk of falling if she tries to move too much.  She reports she has developed swelling in both ankles bilaterally.  She reports that she does her best to try to stay active by reading on her computer but does not watch much in the way of TV.  She is not able to exercise.  She reports she is tolerating her Eliquis  therapy well with no bleeding, bruising, or dark stools.  She does have a little bit of senile purpura on her extremities but no major bruising.  She reports that her Eliquis  is currently costing her $0 per month.  She does not have any leg pain but does have her chronic shortness of breath.  She is not having any chest pain.  She otherwise denies any fevers, chills, sweats, nausea, vomiting or diarrhea.  Full 10  point ROS otherwise negative.  MEDICAL HISTORY:  Past Medical History:  Diagnosis Date   ADHD (attention deficit hyperactivity disorder)    Allergy    trees/pollen, mold, fungus, dust mites. Takes allergy shots   Arthritis    PAIN AND OA LEFT HIP   Asthma    allergist Dr Kozlo- monthly allergy injections   Autonomic dysfunction    CENTRAL NERVOUS SYSTEM NEUROPATHY - DX BY DR. MAURICE MORE THAN 10 YRS AGO - AND IT IS FELT TO CONTRIBUTE TO THE AUTOMIC  DYSFUNCTION-- PT HAS NUMBNESS LEGS AND FEET AND SOMETIIMES TIPS OF FINGER, SEVERE CONSTIPATION( NO SENSATION TO HAVE BM ), DOUBLE VISION, ORTHOSTATIC HYPOTENSION   Blood transfusion    Breast cancer (HCC)    right side   Bruises easily    Cataract    Chest pain    a. 12/2012 Cath: nl cors, EF 55-65%.   Complication of anesthesia    reaction to some anesthetics/ 7/12 anesth record on chart- states prefers epidural   CVA (cerebrovascular accident) (HCC) 03/21/2018   Depression    Diverticulosis    Dysrhythmia    HX OF HIGH GRADE HEART BLOCK - REQUIRED PACEMAKER INSERTION   Esophageal stricture    Gastritis    GERD (gastroesophageal reflux disease)    H/O hiatal hernia    Hemorrhoids    Hernia of abdominal wall    spigelian hernia RLQ - SURGERY TO REPAIR   Hypothyroidism    Interstitial  cystitis    Latex allergy, contact dermatitis    Mallory - Weiss tear    HEALED    Neuromuscular disorder (HCC)    central nervous system neuropathy- seen per Dr Maurice   Pacemaker    Pericardial effusion    a. 12/2012 following ppm placement;  b. 01/01/2013 Echo: EF 55-60%, small pericardial effusion w/o RV collapse-->No need for tap/window.   PONV (postoperative nausea and vomiting)    pt needs scop patch   Recurrent upper respiratory infection (URI) 1/13- to present   bronchitis following surgery- states improved but still with cough. OV with [facility] Dr Shepard 09/06/11 on chart   Shortness of breath    AT TIMES - BUT MUCH IMPROVED AFTER  PACEMAKER WAS REPROGRAMED.   Symptomatic bradycardia    a. 12/2012 s/p MDT dual chamber PPM, ser # [id number]; b. 12/2012 post-op course complicated by pericardial effusinon req lead revision.   Thyroid  disease     SURGICAL HISTORY: Past Surgical History:  Procedure Laterality Date   ABDOMINAL HYSTERECTOMY  1974   BACK SURGERY     cervical fusion 4-5 with plate   BLADDER SUSPENSION     BREAST BIOPSY  2002   NO BLOOD PRESSURES ON RIGHT SIDE/   s/p  axillary node dissection   CYSTO WITH HYDRODISTENSION  09/07/2011   Procedure: CYSTOSCOPY/HYDRODISTENSION;  Surgeon: Arlena LILLETTE Gal, MD;  Location: [facility];  Service: Urology;  Laterality: N/A;  INSTILLATION OF MARCAINE /PYRIDIUM  INSTILLATION OF MARCAINE /KENALOG    CYSTOSCOPY  1975, 2007   EYE SURGERY     LASIK EYE SURGERY BILATERAL   LAPAROSCOPY  1973   LEAD REVISION N/A 12/30/2012   Procedure: LEAD REVISION;  Surgeon: Danelle LELON Birmingham, MD;  [facility] CATH LAB;  Service: Cardiovascular;  Laterality: N/A;   LEFT HEART CATHETERIZATION WITH CORONARY ANGIOGRAM N/A 12/29/2012   Procedure: LEFT HEART CATHETERIZATION WITH CORONARY ANGIOGRAM;  Surgeon: Peter M Jordan, MD;  Location: [facility] CATH LAB;  Service: Cardiovascular;  Laterality: N/A;   MASTECTOMY MODIFIED RADICAL     right; with immediate reconstruction   MYRINGOPLASTY  1962   NECK SURGERY     c4-5 ruptured disc   OOPHORECTOMY  1982   PACEMAKER INSERTION     PERMANENT PACEMAKER INSERTION N/A 12/29/2012   Procedure: PERMANENT PACEMAKER INSERTION;  Surgeon: Elspeth JAYSON Sage, MD;  Location: [facility] CATH LAB;  Service: Cardiovascular;  Laterality: N/A;   PPM GENERATOR CHANGEOUT N/A 02/27/2023   Procedure: PPM GENERATOR CHANGEOUT;  Surgeon: Sage Elspeth JAYSON, MD;  Location: [facility] INVASIVE CV LAB;  Service: Cardiovascular;  Laterality: N/A;   RECTOCELE REPAIR     with cystocele repair   TONSILLECTOMY     TOTAL HIP ARTHROPLASTY Right    TOTAL HIP ARTHROPLASTY Left 06/12/2013   Procedure: LEFT TOTAL HIP  ARTHROPLASTY ANTERIOR APPROACH;  Surgeon: Lonni CINDERELLA Poli, MD;  Location: [facility];  Service: Orthopedics;  Laterality: Left;   TYMPANOPLASTY  1973   right   ULNAR NERVE TRANSPOSITION Right    VENTRAL HERNIA REPAIR  05/30/2011   Procedure: HERNIA REPAIR VENTRAL ADULT;  Surgeon: Sherlean JINNY Laughter, MD;  Location: [facility];  Service: General;  Laterality: Right;  repair right spigelian hernia    SOCIAL HISTORY: Social History   Socioeconomic History   Marital status: Married    Spouse name: Not on file   Number of children: 2   Years of education: 15   Highest education level: Not on file  Occupational History   Occupation:  Retired    Associate Professor: [employer]    Comment: vp corporate affairs [employer]    Employer: [employer]  Tobacco Use   Smoking status: Never   Smokeless tobacco: Never  Vaping Use   Vaping status: Never Used  Substance and Sexual Activity   Alcohol  use: Yes    Alcohol /week: 7.0 standard drinks of alcohol     Types: 7 Glasses of wine per week    Comment: occasional   Drug use: No   Sexual activity: Not on file  Other Topics Concern   Not on file  Social History Narrative   Lives at home w/ her husband   Patient drinks 1-2 cup of caffeine daily.   Patient is right handed.   Social Drivers of Corporate Investment Banker Strain: Not on file  Food Insecurity: No Food Insecurity (10/25/2023)   Hunger Vital Sign    Worried About Running Out of Food in the Last Year: Never true    Ran Out of Food in the Last Year: Never true  Transportation Needs: No Transportation Needs (10/25/2023)   PRAPARE - Administrator, Civil Service (Medical): No    Lack of Transportation (Non-Medical): No  Physical Activity: Not on file  Stress: Not on file  Social Connections: Unknown (09/24/2021)   Received [facility]   Social Network    Social Network: Not on file  Intimate Partner Violence: Low Risk  (02/13/2024)   [facility]   [facility] Historical Interpersonal Safety    Does anyone neglect, hurt, or threaten the patient?: No    FAMILY HISTORY: Family History  Problem Relation Age of Onset   Heart disease Father    Colon cancer Mother 63   Depression Mother    Pancreatic cancer Sister 3   Diverticulitis Sister    Uterine cancer Maternal Grandmother    Heart disease Brother    Heart disease Paternal Grandmother    Heart disease Paternal Grandfather    Throat cancer Maternal Uncle    Heart disease Paternal Aunt    Heart disease Paternal Uncle    Heart disease Brother    Thrombosis Maternal Aunt    Cancer Maternal Uncle        NOS   Diabetes Other        aunt    ALLERGIES:  is allergic to anesthetics, amide; clindamycin ; shellfish allergy; sulfonamide derivatives; neomycin; zolpidem  tartrate; azithromycin ; bactroban; ciprofloxacin; codeine; meperidine hcl; morphine ; neosporin [neomycin-polymyxin-gramicidin]; nitrofurantoin ; oxycodone -acetaminophen ; penicillins; tramadol ; and latex.  MEDICATIONS:  Current Outpatient Medications  Medication Sig Dispense Refill   acetaminophen  (TYLENOL ) 500 MG tablet Take 1,000 mg by mouth 2 (two) times daily as needed for moderate pain.     apixaban  (ELIQUIS ) 5 MG TABS tablet Take 1 tablet (5 mg total) by mouth 2 (two) times daily. Start taking after completion of starter pack. 60 tablet 4   aspirin  EC 81 MG EC tablet Take 1 tablet (81 mg total) by mouth daily. (Patient not taking: Reported on 01/27/2024)     Bacitracin-Polymyxin B (POLYSPORIN) 500-10000 UNIT/GM OINT 1 application as needed Externally twice a day for 5 days     budesonide  (ENTOCORT EC ) 3 MG 24 hr capsule Take 3 mg by mouth daily. Every other day     Calcium  Carbonate-Vitamin D  (CALTRATE 600+D PO) Take 1 tablet by mouth daily.     cetirizine (ZYRTEC) 10 MG tablet Take 10 mg by mouth daily.  Cholecalciferol  (VITAMIN D ) 50 MCG (2000 UT) CAPS  Take 2,000 Units by mouth daily.     cycloSPORINE (RESTASIS) 0.05 % ophthalmic emulsion Place 1 drop into both eyes 2 (two) times daily as needed (dry eyes).     diclofenac Sodium (VOLTAREN) 1 % GEL Apply 1 application  topically daily as needed (pain).     droxidopa  (NORTHERA ) 100 MG CAPS Take 1 capsule (100 mg total) by mouth 3 (three) times daily with meals.     DULoxetine  (CYMBALTA ) 60 MG capsule Take 60 mg by mouth daily.     fludrocortisone  (FLORINEF ) 0.1 MG tablet Take 1 tablet (0.1 mg total) by mouth daily. 270 tablet 3   folic acid (FOLVITE) 400 MCG tablet Take 400 mcg by mouth daily.     levothyroxine  (SYNTHROID ) 50 MCG tablet Take 50 mcg by mouth 3 (three) times a week. Take 50 mcg daily on Tuesday,Thursday, and Sunday     levothyroxine  (SYNTHROID ) 75 MCG tablet Take 75 mcg by mouth 4 (four) times a week. Pt takes on Monday,Wednesday,Friday, and Saturday     Magnesium  250 MG TABS Take 500 mg by mouth daily.     meclizine  (ANTIVERT ) 25 MG tablet Take 25 mg by mouth 3 (three) times daily as needed for dizziness.      Melatonin 5 MG CAPS Take 10 mg by mouth at bedtime.     midodrine  (PROAMATINE ) 10 MG tablet Take 1 tablet (10 mg total) by mouth 3 (three) times daily. Currently taking three times daily     Omega 3 1200 MG CAPS Take 1,200 mg by mouth daily.     potassium chloride  (KLOR-CON  M) 10 MEQ tablet Take 2 tablets (20 mEq total) by mouth daily. 180 tablet 3   Probiotic Product (ALIGN) 4 MG CAPS Take 4 mg by mouth at bedtime.      pyridostigmine  (MESTINON ) 60 MG tablet Take 0.5 tablets (30 mg total) by mouth daily.     rosuvastatin  (CRESTOR ) 10 MG tablet Take 1 tablet (10 mg total) by mouth daily. (Patient taking differently: Take 10 mg by mouth at bedtime.) 90 tablet 0   vitamin B-12 (CYANOCOBALAMIN ) 500 MCG tablet Take 500 mcg by mouth daily.     No current facility-administered medications for this visit.   Facility-Administered Medications Ordered in Other Visits  Medication  Dose Route Frequency Provider Last Rate Last Admin   bupivacaine  (MARCAINE ) 0.5 % 10 mL, triamcinolone  acetonide (KENALOG -40) 40 mg injection   Subcutaneous Once Tannenbaum, Sigmund, MD       bupivacaine  (MARCAINE ) 0.5 % 15 mL, phenazopyridine  (PYRIDIUM ) 400 mg bladder mixture   Bladder Instillation Once Chales Idol, MD        REVIEW OF SYSTEMS:   Constitutional: ( - ) fevers, ( - )  chills , ( - ) night sweats Eyes: ( - ) blurriness of vision, ( - ) double vision, ( - ) watery eyes Ears, nose, mouth, throat, and face: ( - ) mucositis, ( - ) sore throat Respiratory: ( - ) cough, ( - ) dyspnea, ( - ) wheezes Cardiovascular: ( - ) palpitation, ( - ) chest discomfort, ( - ) lower extremity swelling Gastrointestinal:  ( - ) nausea, ( - ) heartburn, ( - ) change in bowel habits Skin: ( - ) abnormal skin rashes Lymphatics: ( - ) new lymphadenopathy, ( - ) easy bruising Neurological: ( - ) numbness, ( - ) tingling, ( - ) new weaknesses Behavioral/Psych: ( - ) mood change, ( - )  new changes  All other systems were reviewed with the patient and are negative.  PHYSICAL EXAMINATION:  Vitals:   03/26/24 1414  BP: 137/74  Pulse: 67  Resp: 19  Temp: 98.1 F (36.7 C)  SpO2: 98%   Filed Weights   03/26/24 1414  Weight: 161 lb 11.2 oz (73.3 kg)    GENERAL: Elderly Caucasian female alert, no distress and comfortable SKIN: skin color, texture, turgor are normal, no rashes or significant lesions EYES: conjunctiva are pink and non-injected, sclera clear LUNGS: clear to auscultation and percussion with normal breathing effort HEART: regular rate & rhythm and no murmurs and no lower extremity edema Musculoskeletal: no cyanosis of digits and no clubbing  PSYCH: alert & oriented x 3, fluent speech NEURO: no focal motor/sensory deficits  LABORATORY DATA:  I have reviewed the data as listed    Latest Ref Rng & Units 03/26/2024    1:53 PM 10/25/2023    3:24 PM 10/08/2023    4:29 PM  CBC   WBC 4.0 - 10.5 K/uL 6.1  9.6    Hemoglobin 12.0 - 15.0 g/dL 87.3  87.6  87.0   Hematocrit 36.0 - 46.0 % 37.3  37.3  38.0   Platelets 150 - 400 K/uL 266  239         Latest Ref Rng & Units 10/25/2023    3:24 PM 10/08/2023    4:29 PM 10/08/2023    4:24 PM  CMP  Glucose 70 - 99 mg/dL 882  871  869   BUN 8 - 23 mg/dL 16  16  14    Creatinine 0.44 - 1.00 mg/dL 9.01  9.19  9.13   Sodium 135 - 145 mmol/L 139  140  138   Potassium 3.5 - 5.1 mmol/L 4.4  3.8  3.9   Chloride 98 - 111 mmol/L 105  104  103   CO2 22 - 32 mmol/L 27   22   Calcium  8.9 - 10.3 mg/dL 9.9   9.2   Total Protein 6.5 - 8.1 g/dL 7.3   7.1   Total Bilirubin 0.0 - 1.2 mg/dL 0.8   0.9   Alkaline Phos 38 - 126 U/L 46   54   AST 15 - 41 U/L 40   64   ALT 0 - 44 U/L 43   78     RADIOGRAPHIC STUDIES: CUP PACEART REMOTE DEVICE CHECK Result Date: 02/27/2024 Pacemaker: Scheduled remote reviewed. Normal device function.  Presenting rhythm: AS/VP Next remote transmission per protocol. ML, CVRS   ASSESSMENT & PLAN [name] 78 y.o. female with medical history significant for LLE DVT who presents for a follow up visit.  After review of the labs, review of the records, and discussion with the patient the patients findings are most consistent with an unprovoked DVT.   A provoked venous thromboembolism (VTE) is one that has a clear inciting factor or event. Provoking factors include prolonged travel/immobility, surgery (particular abdominal or orthropedic), trauma,  and pregnancy/ estrogen containing birth control. After a detailed history and review of the records there is no clear provoking factor for this patient's VTE.  Patients with unprovoked VTEs have up to 25% recurrence after 5 years and 36% at 10 years, with 4% of these clots being fatal (BMJ?2019;366:l4363). Therefore the formal recommendation for unprovoked VTE's is lifelong anticoagulation, as the cause may not be transient or reversible. We recommend 6 months or  full strength anticoagulation with a re-evaluation after that time.  The  patient's will then have a choice of maintenance dose DOAC (preferred, recommended), 81mg  ASA PO daily (non-preferred), or no further anticoagulation (not recommended).     #Unprovoked DVT/Pulmonary Embolism  --findings at this time are consistent with a unprovoked VTE  --labs today show white blood cell 6.1, hemoglobin 12.6, MCV 91, platelets 266.  Creatinine LFTs within normal limits. --ruled out APS with anticardiolipin and anti beta2 glycoprotein antibodies.  Lupus anticoagulant panel would be altered by presence of blood thinner, will hold on this testing.   --recommend the patient continue eliquis  5mg  BID PO  --patient denies any bleeding, bruising, or dark stools on this medication. It is well tolerated. No difficulties accessing/affording the medication  --RTC in 6 months' time with strict return precautions for overt signs of bleeding.   No orders of the defined types were placed in this encounter.   All questions were answered. The patient knows to call the clinic with any problems, questions or concerns.  A total of more than 30 minutes were spent on this encounter with face-to-face time and non-face-to-face time, including preparing to see the patient, ordering tests and/or medications, counseling the patient and coordination of care as outlined above.   Norleen IVAR Kidney, MD Department of Hematology/Oncology [facility] Phone: [phone] Pager: [phone] Email: [email or web address]  03/26/2024 2:18 PM

## 2024-03-26 NOTE — Telephone Encounter (Signed)
 Left pt detailed message that she will need to request this medication to be filled from her Cardiology team at [facility] or her Neurology team and if she had any questions, to call me back.

## 2024-03-30 DIAGNOSIS — K5909 Other constipation: Secondary | ICD-10-CM | POA: Diagnosis not present

## 2024-03-30 DIAGNOSIS — G901 Familial dysautonomia [Riley-Day]: Secondary | ICD-10-CM | POA: Diagnosis not present

## 2024-03-31 ENCOUNTER — Other Ambulatory Visit: Payer: Self-pay

## 2024-03-31 ENCOUNTER — Emergency Department (HOSPITAL_COMMUNITY)

## 2024-03-31 ENCOUNTER — Emergency Department (HOSPITAL_COMMUNITY)
Admission: EM | Admit: 2024-03-31 | Discharge: 2024-04-01 | Disposition: A | Attending: Emergency Medicine | Admitting: Emergency Medicine

## 2024-03-31 ENCOUNTER — Encounter (HOSPITAL_COMMUNITY): Payer: Self-pay | Admitting: Emergency Medicine

## 2024-03-31 DIAGNOSIS — R42 Dizziness and giddiness: Secondary | ICD-10-CM | POA: Diagnosis not present

## 2024-03-31 DIAGNOSIS — Z79899 Other long term (current) drug therapy: Secondary | ICD-10-CM | POA: Insufficient documentation

## 2024-03-31 DIAGNOSIS — R110 Nausea: Secondary | ICD-10-CM | POA: Diagnosis not present

## 2024-03-31 DIAGNOSIS — R531 Weakness: Secondary | ICD-10-CM | POA: Diagnosis not present

## 2024-03-31 DIAGNOSIS — H538 Other visual disturbances: Secondary | ICD-10-CM | POA: Insufficient documentation

## 2024-03-31 DIAGNOSIS — H539 Unspecified visual disturbance: Secondary | ICD-10-CM

## 2024-03-31 DIAGNOSIS — R519 Headache, unspecified: Secondary | ICD-10-CM | POA: Diagnosis not present

## 2024-03-31 DIAGNOSIS — Z91040 Latex allergy status: Secondary | ICD-10-CM | POA: Diagnosis not present

## 2024-03-31 DIAGNOSIS — Z950 Presence of cardiac pacemaker: Secondary | ICD-10-CM | POA: Insufficient documentation

## 2024-03-31 DIAGNOSIS — Z7901 Long term (current) use of anticoagulants: Secondary | ICD-10-CM | POA: Diagnosis not present

## 2024-03-31 DIAGNOSIS — R109 Unspecified abdominal pain: Secondary | ICD-10-CM | POA: Diagnosis not present

## 2024-03-31 DIAGNOSIS — D72829 Elevated white blood cell count, unspecified: Secondary | ICD-10-CM | POA: Diagnosis not present

## 2024-03-31 DIAGNOSIS — G9389 Other specified disorders of brain: Secondary | ICD-10-CM | POA: Diagnosis not present

## 2024-03-31 DIAGNOSIS — I6782 Cerebral ischemia: Secondary | ICD-10-CM | POA: Diagnosis not present

## 2024-03-31 DIAGNOSIS — I10 Essential (primary) hypertension: Secondary | ICD-10-CM | POA: Diagnosis not present

## 2024-03-31 DIAGNOSIS — R112 Nausea with vomiting, unspecified: Secondary | ICD-10-CM | POA: Diagnosis not present

## 2024-03-31 DIAGNOSIS — R000 Tachycardia, unspecified: Secondary | ICD-10-CM | POA: Diagnosis not present

## 2024-03-31 LAB — COMPREHENSIVE METABOLIC PANEL WITH GFR
ALT: 37 U/L (ref 0–44)
AST: 41 U/L (ref 15–41)
Albumin: 4.4 g/dL (ref 3.5–5.0)
Alkaline Phosphatase: 58 U/L (ref 38–126)
Anion gap: 14 (ref 5–15)
BUN: 13 mg/dL (ref 8–23)
CO2: 23 mmol/L (ref 22–32)
Calcium: 10.1 mg/dL (ref 8.9–10.3)
Chloride: 101 mmol/L (ref 98–111)
Creatinine, Ser: 0.94 mg/dL (ref 0.44–1.00)
GFR, Estimated: >60 mL/min (ref >60)
Glucose, Bld: 109 mg/dL — ABNORMAL HIGH (ref 70–99)
Potassium: 3.8 mmol/L (ref 3.5–5.1)
Sodium: 138 mmol/L (ref 135–145)
Total Bilirubin: 1 mg/dL (ref 0.0–1.2)
Total Protein: 7.5 g/dL (ref 6.5–8.1)

## 2024-03-31 LAB — URINALYSIS, ROUTINE W REFLEX MICROSCOPIC
Bacteria, UA: NONE SEEN
Bilirubin Urine: NEGATIVE
Glucose, UA: 50 mg/dL — AB
Ketones, ur: 5 mg/dL — AB
Nitrite: NEGATIVE
Protein, ur: 30 mg/dL — AB
Specific Gravity, Urine: 1.011 (ref 1.005–1.030)
pH: 7 (ref 5.0–8.0)

## 2024-03-31 LAB — CBC
HCT: 43.4 % (ref 36.0–46.0)
Hemoglobin: 14.5 g/dL (ref 12.0–15.0)
MCH: 30.5 pg (ref 26.0–34.0)
MCHC: 33.4 g/dL (ref 30.0–36.0)
MCV: 91.2 fL (ref 80.0–100.0)
Platelets: 261 K/uL (ref 150–400)
RBC: 4.76 MIL/uL (ref 3.87–5.11)
RDW: 13.5 % (ref 11.5–15.5)
WBC: 9.7 K/uL (ref 4.0–10.5)
nRBC: 0 % (ref 0.0–0.2)

## 2024-03-31 LAB — LIPASE, BLOOD: Lipase: 40 U/L (ref 11–51)

## 2024-03-31 LAB — TSH: TSH: 3.909 u[IU]/mL (ref 0.350–4.500)

## 2024-03-31 MED ORDER — SODIUM CHLORIDE 0.9 % IV BOLUS
1000.0000 mL | Freq: Once | INTRAVENOUS | Status: AC
  Administered 2024-03-31: 1000 mL via INTRAVENOUS

## 2024-03-31 MED ORDER — MECLIZINE HCL 25 MG PO TABS
50.0000 mg | ORAL_TABLET | Freq: Once | ORAL | Status: DC
Start: 2024-03-31 — End: 2024-04-01
  Filled 2024-03-31: qty 2

## 2024-03-31 MED ORDER — ACETAMINOPHEN 325 MG PO TABS
325.0000 mg | ORAL_TABLET | Freq: Once | ORAL | Status: AC
  Administered 2024-03-31: 325 mg via ORAL
  Filled 2024-03-31: qty 1

## 2024-03-31 MED ORDER — ACETAMINOPHEN 325 MG PO TABS: 650.0000 mg | ORAL_TABLET | Freq: Once | ORAL | Status: DC

## 2024-03-31 MED ORDER — APIXABAN 5 MG PO TABS
5.0000 mg | ORAL_TABLET | Freq: Once | ORAL | Status: AC
  Administered 2024-03-31: 5 mg via ORAL
  Filled 2024-03-31: qty 1

## 2024-03-31 MED ORDER — METHYLPREDNISOLONE SODIUM SUCC 125 MG IJ SOLR
125.0000 mg | Freq: Once | INTRAMUSCULAR | Status: AC
  Administered 2024-03-31: 125 mg via INTRAVENOUS
  Filled 2024-03-31: qty 2

## 2024-03-31 MED ORDER — MAGNESIUM SULFATE 2 GM/50ML IV SOLN
2.0000 g | Freq: Once | INTRAVENOUS | Status: AC
  Administered 2024-03-31: 2 g via INTRAVENOUS
  Filled 2024-03-31: qty 50

## 2024-03-31 MED ORDER — SODIUM CHLORIDE 0.9 % IV BOLUS: 1000.0000 mL | Freq: Once | INTRAVENOUS | Status: DC

## 2024-03-31 MED ORDER — ONDANSETRON 4 MG PO TBDP
4.0000 mg | ORAL_TABLET | Freq: Once | ORAL | Status: AC | PRN
  Administered 2024-03-31: 4 mg via ORAL
  Filled 2024-03-31: qty 1

## 2024-03-31 NOTE — ED Triage Notes (Signed)
 BIB [facility] from home. Pt states that her BP has been high with nausea and abd cramping. Drs have been adjusting BP meds. Pt reports BP of 210/104. Left sided weakness that is pt baseline (hx of complex migraine). Denies headache. Neuro WNL.   BP 210/100 HR 60 (paced) Resp 16 Spo2 98

## 2024-03-31 NOTE — ED Provider Notes (Signed)
 [facility] AT [facility] Provider Note   CSN: [id number] Arrival date & time: 03/31/24  8085     Patient presents with: Abdominal Pain, Hypertension, Nausea, and Emesis   [name] is a 78 y.o. female.   Pt is a 78 yo female presenting for emesis. Pt admits to multiple episodes of emesis that started this evening. Pt states admits to a headache that has been developing slowly after episodes of vomiting. Symptom onset 7PM today. No falls or head trauma. Admits to left eye blurred vision that is new. States she had symptoms like this in the past and was dx with a silent migraine after severe vomiting and eye twitching with admission and normal MRI brain ruling out stroke. Denies sensation or motor deficits. Does admit to some abdominal cramping that she thinks is unrelated due to chronic cramping from colitis and/or constipation. Last BM this morning and described as normal, large, and soft.  BP on arrival 210/97  Last neuro note 01/2024 dx with panautonomic failure, supine hypertension, and orthostatic hypotension.   Pacemaker dependant on eliquis   The history is provided by the patient. No language interpreter was used.  Abdominal Pain Associated symptoms: nausea and vomiting   Associated symptoms: no chest pain, no chills, no cough, no dysuria, no fever, no hematuria, no shortness of breath and no sore throat   Hypertension Pertinent negatives include no chest pain, no abdominal pain and no shortness of breath.  Emesis Associated symptoms: no abdominal pain, no arthralgias, no chills, no cough, no fever and no sore throat        Prior to Admission medications   Medication Sig Start Date End Date Taking? Authorizing Provider  acetaminophen  (TYLENOL ) 500 MG tablet Take 1,000 mg by mouth 2 (two) times daily as needed for moderate pain.   Yes [provider]  apixaban  (ELIQUIS ) 5 MG TABS tablet Take 5 mg by mouth 2 (two) times  daily.   Yes [provider]  Bacitracin-Polymyxin B (POLYSPORIN) 500-10000 UNIT/GM OINT Apply 1 application  topically 2 (two) times daily as needed (skin/tissue irritation in nose). 10/14/23  Yes [provider]  budesonide  (ENTOCORT EC ) 3 MG 24 hr capsule Take 3 mg by mouth every other day.   Yes [provider]  Calcium  Carbonate-Vitamin D  (CALTRATE 600+D PO) Take 1 tablet by mouth daily.   Yes [provider]  cetirizine (ZYRTEC) 10 MG tablet Take 10 mg by mouth in the morning.   Yes [provider]  Cholecalciferol  (VITAMIN D ) 50 MCG (2000 UT) CAPS Take 2,000 Units by mouth daily.   Yes [provider]  cyanocobalamin  1000 MCG tablet Take 1,000 mcg by mouth in the morning.   Yes [provider]  cycloSPORINE (RESTASIS) 0.05 % ophthalmic emulsion Place 1 drop into both eyes 2 (two) times daily. 12/13/20  Yes [provider]  diclofenac Sodium (VOLTAREN) 1 % GEL Apply 1 application  topically daily as needed (neuropathic pain in feet).   Yes [provider]  droxidopa  (NORTHERA ) 100 MG CAPS Take 1 capsule (100 mg total) by mouth 3 (three) times daily with meals. Patient taking differently: Take 300 mg by mouth 3 (three) times daily with meals. 12/17/23  Yes Leverne Charlies Helling, PA-C  DULoxetine  (CYMBALTA ) 60 MG capsule Take 60 mg by mouth in the morning.   Yes [provider]  EPINEPHrine  0.3 mg/0.3 mL IJ SOAJ injection Inject 0.3 mg into the muscle as needed for anaphylaxis.  Yes [provider]  fludrocortisone  (FLORINEF ) 0.1 MG tablet Take 1 tablet (0.1 mg total) by mouth daily. Patient taking differently: Take 0.05 mg by mouth daily. 02/27/23  Yes Fernande Elspeth BROCKS, MD  folic acid (FOLVITE) 400 MCG tablet Take 400 mcg by mouth in the morning.   Yes [provider]  levothyroxine  (SYNTHROID ) 50 MCG tablet Take 50 mcg by mouth 3 (three) times a week. Take 50 mcg daily on Tuesday,Thursday, and  Sunday   Yes [provider]  levothyroxine  (SYNTHROID ) 75 MCG tablet Take 75 mcg by mouth 4 (four) times a week. Pt takes on Monday,Wednesday,Friday, and Saturday   Yes [provider]  linaclotide (LINZESS) 145 MCG CAPS capsule Take 145 mcg by mouth daily before breakfast. 03/30/24  Yes [provider]  losartan (COZAAR) 50 MG tablet Take 50 mg by mouth at bedtime.   Yes [provider]  MAGNESIUM  GLYCINATE PO Take 1,000 mg by mouth at bedtime.   Yes [provider]  meclizine  (ANTIVERT ) 25 MG tablet Take 25 mg by mouth 3 (three) times daily as needed for dizziness.    Yes [provider]  Melatonin 5 MG CHEW Chew 10 mg by mouth at bedtime.   Yes [provider]  midodrine  (PROAMATINE ) 10 MG tablet Take 1 tablet (10 mg total) by mouth 3 (three) times daily. Currently taking three times daily Patient taking differently: Take 5 mg by mouth 3 (three) times daily. 12/17/23  Yes Leverne Charlies Helling, PA-C  ondansetron  (ZOFRAN ) 4 MG tablet Take 4 mg by mouth every 8 (eight) hours as needed for nausea or vomiting. 01/14/20  Yes [provider]  Probiotic Product (ALIGN) 4 MG CAPS Take 4 mg by mouth every evening.   Yes [provider]  pyridostigmine  (MESTINON ) 60 MG tablet Take 0.5 tablets (30 mg total) by mouth daily. Patient taking differently: Take 60 mg by mouth daily. 12/17/23  Yes Leverne Charlies Helling, PA-C  rosuvastatin  (CRESTOR ) 10 MG tablet Take 1 tablet (10 mg total) by mouth daily. Patient taking differently: Take 10 mg by mouth every evening. 07/03/18  Yes McCue, Harlene, NP  valACYclovir (VALTREX) 500 MG tablet Take 2,000 mg by mouth as needed (Cold sores after dental work). 03/21/24  Yes [provider]  lubiprostone (AMITIZA) 8 MCG capsule Take 8 mcg by mouth 2 (two) times daily with a meal. Patient not taking: Reported on 03/31/2024 03/23/24 06/21/24  [provider]    Allergies: Anesthetics, amide;  Azithromycin ; Clindamycin ; Meperidine hcl; Shellfish allergy; Sulfasalazine; Sulfonamide derivatives; Tramadol ; Zinc; Zolpidem  tartrate; Latex; Morphine ; Neomycin; Neosporin [neomycin-polymyxin-gramicidin]; Nitrofurantoin ; Tape; Bactroban; Ciprofloxacin; Codeine; Oxycodone -acetaminophen ; Penicillins; and Wound dressing adhesive    Review of Systems  Constitutional:  Negative for chills and fever.  HENT:  Negative for ear pain and sore throat.   Eyes:  Negative for pain and visual disturbance.  Respiratory:  Negative for cough and shortness of breath.   Cardiovascular:  Negative for chest pain and palpitations.  Gastrointestinal:  Positive for nausea and vomiting. Negative for abdominal pain.  Genitourinary:  Negative for dysuria and hematuria.  Musculoskeletal:  Negative for arthralgias and back pain.  Skin:  Negative for color change and rash.  Neurological:  Positive for dizziness. Negative for seizures and syncope.  All other systems reviewed and are negative.   Updated Vital Signs BP (!) 151/85   Pulse 66   Temp 97.9 F (36.6 C) (Oral)   Resp 16   Ht 5' 7 (1.702 m)  Wt 73.3 kg   SpO2 100%   BMI 25.33 kg/m   Physical Exam Vitals and nursing note reviewed.  Constitutional:      General: She is not in acute distress.    Appearance: She is well-developed.  HENT:     Head: Normocephalic and atraumatic.  Eyes:     General: Lids are normal. Vision grossly intact.     Extraocular Movements:     Right eye: No nystagmus.     Left eye: No nystagmus.     Conjunctiva/sclera: Conjunctivae normal.     Pupils: Pupils are equal, round, and reactive to light.     Visual Fields: Right eye visual fields normal and left eye visual fields normal.  Cardiovascular:     Rate and Rhythm: Normal rate and regular rhythm.     Heart sounds: No murmur heard. Pulmonary:     Effort: Pulmonary effort is normal. No respiratory distress.     Breath sounds: Normal breath sounds.  Abdominal:      Palpations: Abdomen is soft.     Tenderness: There is no abdominal tenderness.  Musculoskeletal:        General: No swelling.     Cervical back: Neck supple.  Skin:    General: Skin is warm and dry.     Capillary Refill: Capillary refill takes less than 2 seconds.  Neurological:     Mental Status: She is alert and oriented to person, place, and time.     GCS: GCS eye subscore is 4. GCS verbal subscore is 5. GCS motor subscore is 6.     Cranial Nerves: Cranial nerves 2-12 are intact.     Sensory: Sensation is intact.     Motor: Motor function is intact.     Coordination: Coordination is intact.     Gait: Gait is intact.  Psychiatric:        Mood and Affect: Mood normal.     (all labs ordered are listed, but only abnormal results are displayed) Labs Reviewed  COMPREHENSIVE METABOLIC PANEL WITH GFR - Abnormal; Notable for the following components:      Result Value   Glucose, Bld 109 (*)    All other components within normal limits  URINALYSIS, ROUTINE W REFLEX MICROSCOPIC - Abnormal; Notable for the following components:   Glucose, UA 50 (*)    Hgb urine dipstick SMALL (*)    Ketones, ur 5 (*)    Protein, ur 30 (*)    Leukocytes,Ua SMALL (*)    Non Squamous Epithelial 0-5 (*)    All other components within normal limits  LIPASE, BLOOD  CBC  TSH  TROPONIN I (HIGH SENSITIVITY)    EKG: None  Radiology: CT Head Wo Contrast Result Date: 03/31/2024 CLINICAL DATA:  Headache with vomiting and dizziness. EXAM: CT HEAD WITHOUT CONTRAST TECHNIQUE: Contiguous axial images were obtained from the base of the skull through the vertex without intravenous contrast. RADIATION DOSE REDUCTION: This exam was performed according to the departmental dose-optimization program which includes automated exposure control, adjustment of the mA and/or kV according to patient size and/or use of iterative reconstruction technique. COMPARISON:  Oct 08, 2023 FINDINGS: Brain: There is generalized cerebral  atrophy with widening of the extra-axial spaces and ventricular dilatation. There are areas of decreased attenuation within the white matter tracts of the supratentorial brain, consistent with microvascular disease changes. Vascular: No hyperdense vessel or unexpected calcification. Skull: Normal. Negative for fracture or focal lesion. Sinuses/Orbits: No acute finding. Other: None.  IMPRESSION: 1. No acute intracranial abnormality. 2. Generalized cerebral atrophy with chronic white matter small vessel ischemic changes. Electronically Signed   By: Suzen Dials M.D.   On: 03/31/2024 22:10     Procedures   Medications Ordered in the ED  meclizine  (ANTIVERT ) tablet 50 mg (has no administration in time range)  sodium chloride  0.9 % bolus 1,000 mL (has no administration in time range)  ondansetron  (ZOFRAN -ODT) disintegrating tablet 4 mg (4 mg Oral Given 03/31/24 2114)  methylPREDNISolone  sodium succinate (SOLU-MEDROL ) 125 mg/2 mL injection 125 mg (125 mg Intravenous Given 03/31/24 2124)  sodium chloride  0.9 % bolus 1,000 mL (1,000 mLs Intravenous New Bag/Given 03/31/24 2122)  magnesium  sulfate IVPB 2 g 50 mL (0 g Intravenous Stopped 03/31/24 2204)  acetaminophen  (TYLENOL ) tablet 325 mg (325 mg Oral Given 03/31/24 2113)  apixaban  (ELIQUIS ) tablet 5 mg (5 mg Oral Given 03/31/24 2150)                                    Medical Decision Making Amount and/or Complexity of Data Reviewed Labs: ordered. Radiology: ordered.  Risk OTC drugs. Prescription drug management.   Patient is a 78 year old female with past medical history of pan autonomic failure, supine hypertension, orthostatic hypotension, pacemaker dependent on Eliquis  presenting for nausea.  Patient admits to nausea with few episodes of emesis that started this morning evening.  Some blurred vision in the left eye.  No sensation or motor deficits.  No confusion or speech changes.  History of a silent migraine in the past with similar  symptoms in November 2024 after she was ruled out for stroke with an MRI.  No stroke activation at this time. Patient symptoms seem very similar to the past when she was diagnosed with a silent migraine.  No neurovascular deficits on exam.  No limb ataxia.  Dizziness is only present upon standing and eye movement.   IV fluids, steroids, Zofran , Antivert  ordered.    CT head demonstrated no acute process.  Laboratory studies including electrolytes and renal function.  Had significant improvement after treatment.  At this point I do not think it is necessary for further workup.  I reached out to our on-call neurology team to discuss patient's case.  Patient seems to have very close follow-up with her outpatient neurology team.  She is already on maximum therapy-Eliquis -if she were having any posterior stroke like symptoms.  Which have obviously now resolved.  Symptoms seem to favor silent migraine.  Patient in no distress and overall condition improved here in the ED. Detailed discussions were had with the patient regarding current findings, and need for close f/u with PCP or on call doctor. The patient has been instructed to return immediately if the symptoms worsen in any way for re-evaluation. Patient verbalized understanding and is in agreement with current care plan. All questions answered prior to discharge.      Final diagnoses:  Vision changes  Intractable headache, unspecified chronicity pattern, unspecified headache type  Nausea    ED Discharge Orders     None          Elnor Bernarda SQUIBB, DO 03/31/24 2351

## 2024-03-31 NOTE — ED Notes (Signed)
 Patient states she is dizzy and still having vision changes.

## 2024-04-01 DIAGNOSIS — F320 Major depressive disorder, single episode, mild: Secondary | ICD-10-CM | POA: Diagnosis not present

## 2024-04-01 NOTE — ED Notes (Signed)
 Pt dressed and given discharge paperwork without questions. Went home with husband

## 2024-04-02 ENCOUNTER — Encounter: Payer: Self-pay | Admitting: Student in an Organized Health Care Education/Training Program

## 2024-04-02 ENCOUNTER — Ambulatory Visit
Attending: Student in an Organized Health Care Education/Training Program | Admitting: Student in an Organized Health Care Education/Training Program

## 2024-04-02 VITALS — BP 100/64 | HR 70 | Ht 67.0 in | Wt 160.0 lb

## 2024-04-02 DIAGNOSIS — R609 Edema, unspecified: Secondary | ICD-10-CM | POA: Insufficient documentation

## 2024-04-02 DIAGNOSIS — I442 Atrioventricular block, complete: Secondary | ICD-10-CM | POA: Diagnosis not present

## 2024-04-02 LAB — CUP PACEART INCLINIC DEVICE CHECK
Date Time Interrogation Session: 20251120122519
Implantable Lead Connection Status: 753985
Implantable Lead Connection Status: 753985
Implantable Lead Implant Date: 20140818
Implantable Lead Implant Date: 20140818
Implantable Lead Location: 753859
Implantable Lead Location: 753860
Implantable Lead Model: 5076
Implantable Lead Model: 5076
Implantable Pulse Generator Implant Date: 20241016
Lead Channel Impedance Value: 399 Ohm
Lead Channel Impedance Value: 589 Ohm
Lead Channel Pacing Threshold Amplitude: 0.75 V
Lead Channel Pacing Threshold Amplitude: 1 V
Lead Channel Pacing Threshold Pulse Width: 0.4 ms
Lead Channel Pacing Threshold Pulse Width: 0.4 ms
Lead Channel Sensing Intrinsic Amplitude: 1.5 mV

## 2024-04-02 NOTE — Progress Notes (Signed)
 Cardiology Office Note   Date:  04/07/2024  ID:  Roseanne, Juenger 07-18-1945, MRN [id number] PCP: Shepard Ade, MD  [facility] Providers Cardiologist:  None Electrophysiologist:  Donnice DELENA Primus, MD    History of Present Illness [name] is a 78 y.o. female with autonomic dysfunction s/p MDT DC PPM implant (DOI 12/30/22), complex migraines, MDD who presents for device management.  History of Present Illness [name] is a 78 year old female with complex migraine and autonomic dysfunction who presents with worsening symptoms and medication management issues. She was referred by Dr. Cena Gander for management of her autonomic dysfunction and complex migraine.  Her complex migraines and autonomic dysfunction have worsened since the passing of her daughter in September 2024. Two months after her daughter's death, she experienced a significant episode initially suspected to be a stroke but later diagnosed as a complex migraine, resulting in cranial nerve damage affecting her voice, vision, and hearing. She continues to experience memory issues and cognitive difficulties, with an MRI showing cranial nerve damage, though CT scans have not confirmed this.  She experiences significant autonomic dysfunction, including orthostatic hypotension and fluctuating blood pressure. She takes droxidopa  for her autonomic dysfunction. Her blood pressure varies significantly with position changes, and she experiences shortness of breath upon exertion, such as walking with a walker. She has not engaged in significant exercise for at least a year, impacting her physical condition.  She has a history of collagenous colitis, complicating her autonomic dysfunction, and is followed at [facility] for gastrointestinal issues. She has difficulty with sleep, taking 10 mg of melatonin at night, and has concerns about its potential impact on heart failure.  She has a pacemaker  placed by Dr. Fernande. Her heart rate varies with position, and she experiences leg swelling and shortness of breath, particularly when walking. She has had episodes of high blood pressure, reaching 211/114 mmHg, prompting emergency care.  She is on an antidepressant and in therapy to manage grief and stress following her daughter's death. She has difficulty managing stress and believes it exacerbates her autonomic symptoms. She has been referred to a specialist at [facility] for memory issues and migraines and is seeking local support for condition management.  ROS: depression  Studies Reviewed  TTE  Result date: 02/11/23  1. Left ventricular ejection fraction, by estimation, is 55 to 60%. The  left ventricle has normal function. The left ventricle has no regional  wall motion abnormalities. There is mild concentric left ventricular  hypertrophy. Left ventricular diastolic  parameters are consistent with Grade I diastolic dysfunction (impaired  relaxation).   2. Right ventricular systolic function is normal. The right ventricular  size is normal.   3. Left atrial size was mildly dilated.   4. Right atrial size was mildly dilated.   5. The mitral valve is normal in structure. Trivial mitral valve  regurgitation. No evidence of mitral stenosis.   6. The aortic valve is tricuspid. There is mild calcification of the  aortic valve. Aortic valve regurgitation is not visualized. Aortic valve  sclerosis/calcification is present, without any evidence of aortic  stenosis.   7. The inferior vena cava is normal in size with greater than 50%  respiratory variability, suggesting right atrial pressure of 3 mmHg.   Physical Exam VS:  BP 100/64 (BP Location: Left Arm, Patient Position: Sitting, Cuff Size: Normal)   Pulse 70   Ht 5' 7 (1.702 m)   Wt 160 lb (72.6 kg)  SpO2 95%   BMI 25.06 kg/m       Wt Readings from Last 3 Encounters:  04/02/24 160 lb (72.6 kg)  03/31/24 161 lb 11.2 oz (73.3  kg)  03/26/24 161 lb 11.2 oz (73.3 kg)    GEN: conversant, in wheelchair, depressed affect CARDIAC: RRR, no murmurs, rubs, gallops RESPIRATORY:  Clear to auscultation without rales, wheezing or rhonchi  EXTREMITIES:  No edema; No deformity  SKIN: LEFT infraclavicular PPM without erythema, erosion or swelling   Device information PPM: MDT [id number] Azure XT DR MRI, SN [id number], DOI 02/27/23 RA: MDT 5076 CapSureFix Novus, SN [id number], DOI 12/29/12 RV: MDT 5076 CapSureFix Novus, SN [id number], DOI 12/29/12  ASSESSMENT AND PLAN [name] is a 78 y.o. female with autonomic dysfunction s/p MDT DC PPM implant (DOI 12/30/22), complex migraines, MDD who presents for device management.  Assessment & Plan Autonomic dysfunction (dysautonomia) with labile blood pressure and orthostatic hypotension Chronic autonomic dysfunction with labile blood pressure and orthostatic hypotension, exacerbated by stress and emotional factors. No evidence of active heart failure. Pacemaker functioning well. Blood pressure management is challenging. Exercise is beneficial but difficult. - Ordered ProBNP to assess heart function. - Consider echocardiogram if ProBNP is elevated. - Encouraged low-intensity exercise protocols such as CHOP and Levine protocols. - Coordinated care with [facility] specialists for medication management.  Complete atrioventricular block with pacemaker in place Complete atrioventricular block managed with pacemaker. Pacemaker functioning well. Heart rate is controlled by intrinsic heart rate. - Continue current pacemaker settings. - mild SOB, proBNP elevated, will arrange for repeat TTE to assess RV fxn since she chronically Rvp now   Complex migraine with neurological sequelae Complex migraines with neurological sequelae, including cranial nerve damage. Recent episode led to hospitalization with elevated blood pressure. Referral to specialist at [facility] for management. -  Continue follow-up with [facility] specialist for migraine management.  Major depressive disorder Exacerbated by recent bereavement and chronic health issues. Symptoms persist despite antidepressant therapy. Stress and emotional factors impact autonomic dysfunction. - Discuss potential for new psychiatric medications with [facility] specialists. - Encouraged ongoing therapy for grief management.  Dispo: RTC 1 year  A total of 50 minutes was spent preparing for the patient, reviewing history, performing exam, document encounter, coordinating care and counseling the patient. 33 minutes was spent with direct patient care.   Signed, Donnice DELENA Primus, MD

## 2024-04-02 NOTE — Progress Notes (Deleted)
  Cardiology Office Note   Date:  04/02/2024  ID:  [name], DOB 03-13-1946, MRN [id number] PCP: Shepard Ade, MD  [facility] Providers Cardiologist:  None { Click to update primary MD,subspecialty MD or APP then REFRESH:1}    History of Present Illness [name] is a 78 y.o. female ***  ROS: ***  Studies Reviewed      *** Risk Assessment/Calculations {Does this patient have ATRIAL FIBRILLATION?:[phone]}         Physical Exam VS:  BP 100/64 (BP Location: Left Arm, Patient Position: Sitting, Cuff Size: Normal)   Pulse 70   Ht 5' 7 (1.702 m)   Wt 160 lb (72.6 kg)   SpO2 95%   BMI 25.06 kg/m        Wt Readings from Last 3 Encounters:  04/02/24 160 lb (72.6 kg)  03/31/24 161 lb 11.2 oz (73.3 kg)  03/26/24 161 lb 11.2 oz (73.3 kg)    GEN: Well nourished, well developed in no acute distress NECK: No JVD; No carotid bruits CARDIAC: ***RRR, no murmurs, rubs, gallops RESPIRATORY:  Clear to auscultation without rales, wheezing or rhonchi  ABDOMEN: Soft, non-tender, non-distended EXTREMITIES:  No edema; No deformity   ASSESSMENT AND PLAN ***    {Are you ordering a CV Procedure (e.g. stress test, cath, DCCV, TEE, etc)?   Press F2        :[id number]}  Dispo: ***  Signed, Donnice DELENA Primus, MD

## 2024-04-02 NOTE — Patient Instructions (Signed)
 Medication Instructions:  Your physician recommends that you continue on your current medications as directed. Please refer to the Current Medication list given to you today.  *If you need a refill on your cardiac medications before your next appointment, please call your pharmacy*  Lab Work: Pro BNP  If you have labs (blood work) drawn today and your tests are completely normal, you will receive your results only by: MyChart Message (if you have MyChart) OR A paper copy in the mail If you have any lab test that is abnormal or we need to change your treatment, we will call you to review the results.  Testing/Procedures: None ordered.   Follow-Up: At [facility], you and your health needs are our priority.  As part of our continuing mission to provide you with exceptional heart care, our providers are all part of one team.  This team includes your primary Cardiologist (physician) and Advanced Practice Providers or APPs (Physician Assistants and Nurse Practitioners) who all work together to provide you with the care you need, when you need it.  Your next appointment:   6 months with Dr Almetta

## 2024-04-03 LAB — PRO B NATRIURETIC PEPTIDE: NT-Pro BNP: 1038 pg/mL — ABNORMAL HIGH (ref 0–738)

## 2024-04-07 ENCOUNTER — Ambulatory Visit: Payer: Self-pay | Admitting: Student in an Organized Health Care Education/Training Program

## 2024-04-07 ENCOUNTER — Other Ambulatory Visit: Payer: Self-pay

## 2024-04-07 DIAGNOSIS — R7989 Other specified abnormal findings of blood chemistry: Secondary | ICD-10-CM

## 2024-04-07 DIAGNOSIS — R600 Localized edema: Secondary | ICD-10-CM

## 2024-04-07 NOTE — Progress Notes (Signed)
 Per Dr Almetta echo ordered for elevated BNP and bilateral LLE.

## 2024-04-08 DIAGNOSIS — F320 Major depressive disorder, single episode, mild: Secondary | ICD-10-CM | POA: Diagnosis not present

## 2024-05-19 ENCOUNTER — Ambulatory Visit (HOSPITAL_COMMUNITY)
Admission: RE | Admit: 2024-05-19 | Discharge: 2024-05-19 | Disposition: A | Discharge status: Home / Self Care | Source: Ambulatory Visit | Attending: Cardiology | Admitting: Cardiology

## 2024-05-19 ENCOUNTER — Ambulatory Visit: Payer: Self-pay | Admitting: Student in an Organized Health Care Education/Training Program

## 2024-05-19 DIAGNOSIS — R600 Localized edema: Secondary | ICD-10-CM | POA: Diagnosis present

## 2024-05-19 DIAGNOSIS — R7989 Other specified abnormal findings of blood chemistry: Secondary | ICD-10-CM | POA: Insufficient documentation

## 2024-05-19 LAB — ECHOCARDIOGRAM COMPLETE
Area-P 1/2: 3.45 cm2
Est EF: 55
S' Lateral: 2.9 cm

## 2024-05-22 ENCOUNTER — Ambulatory Visit (INDEPENDENT_AMBULATORY_CARE_PROVIDER_SITE_OTHER): Admitting: Medical Genetics

## 2024-05-22 DIAGNOSIS — Z006 Encounter for examination for normal comparison and control in clinical research program: Secondary | ICD-10-CM

## 2024-05-22 NOTE — Progress Notes (Signed)
 Study: [employer]   Subject Name: [name]   Participant [name] [phone]) was identified as a potential candidate for the above listed study. The informed consent documents were provided to the participant by mail.   The patient was asked the eligibility questions. Confirmed the participant met eligibility criteria.   A copy of the informed consent form was provided to the subject for review. The patient was provided the option of taking the informed consent documents to review and was encouraged to review at their convenience with their support network, including other care providers.    The patient was provided with the informed consent questionnaire to demonstrate understanding of the informed consent form. The patient was provided with the correct answers to the informed consent questionnaire.    All the participants questions and concerns regarding participation in the study and the informed consent were addressed to the patient's satisfaction.    The patient has agreed to participate in the above-listed research study and has voluntarily signed the informed consent form [Helix V7 07/01/2023, CH V5 10/15/2023]. The patient was provided with a copy of the signed informed consent form for their reference.   The informed consent was obtained prior to performance of any protocol-specific procedures for the subject. A copy of the signed informed consent will be scanned in the subject's medical record.       Best,  Noemi Bimler, Ph.D. [facility]   [facility] Clinical Research Coordinator Direct Dial: [phone]

## 2024-05-27 ENCOUNTER — Ambulatory Visit: Payer: Medicare Other

## 2024-05-27 DIAGNOSIS — I442 Atrioventricular block, complete: Secondary | ICD-10-CM

## 2024-05-28 LAB — CUP PACEART REMOTE DEVICE CHECK
Battery Remaining Longevity: 130 mo
Battery Voltage: 3.03 V
Brady Statistic AP VP Percent: 1.24 %
Brady Statistic AP VS Percent: 0 %
Brady Statistic AS VP Percent: 98.76 %
Brady Statistic AS VS Percent: 0.01 %
Brady Statistic RA Percent Paced: 1.23 %
Brady Statistic RV Percent Paced: 99.99 %
Date Time Interrogation Session: 20260114043008
Implantable Lead Connection Status: 753985
Implantable Lead Connection Status: 753985
Implantable Lead Implant Date: 20140818
Implantable Lead Implant Date: 20140818
Implantable Lead Location: 753859
Implantable Lead Location: 753860
Implantable Lead Model: 5076
Implantable Lead Model: 5076
Implantable Pulse Generator Implant Date: 20241016
Lead Channel Impedance Value: 304 Ohm
Lead Channel Impedance Value: 456 Ohm
Lead Channel Impedance Value: 551 Ohm
Lead Channel Impedance Value: 608 Ohm
Lead Channel Pacing Threshold Amplitude: 0.75 V
Lead Channel Pacing Threshold Amplitude: 1 V
Lead Channel Pacing Threshold Pulse Width: 0.4 ms
Lead Channel Pacing Threshold Pulse Width: 0.4 ms
Lead Channel Sensing Intrinsic Amplitude: 1.5 mV
Lead Channel Sensing Intrinsic Amplitude: 1.5 mV
Lead Channel Sensing Intrinsic Amplitude: 15.625 mV
Lead Channel Sensing Intrinsic Amplitude: 15.625 mV
Lead Channel Setting Pacing Amplitude: 2 V
Lead Channel Setting Pacing Amplitude: 2.5 V
Lead Channel Setting Pacing Pulse Width: 0.4 ms
Lead Channel Setting Sensing Sensitivity: 1.2 mV
Zone Setting Status: 755011
Zone Setting Status: 755011

## 2024-05-31 ENCOUNTER — Ambulatory Visit: Payer: Self-pay | Admitting: Student in an Organized Health Care Education/Training Program

## 2024-06-01 ENCOUNTER — Encounter: Payer: Self-pay | Admitting: Student in an Organized Health Care Education/Training Program

## 2024-06-03 NOTE — Progress Notes (Signed)
 Remote PPM Transmission

## 2024-06-05 LAB — GENECONNECT MOLECULAR SCREEN: Genetic Analysis Overall Interpretation: NEGATIVE

## 2024-06-08 ENCOUNTER — Encounter: Payer: Self-pay | Admitting: Student in an Organized Health Care Education/Training Program

## 2024-06-12 NOTE — Addendum Note (Signed)
 Addended by: REMUS NOEMI PARAS on: 06/12/2024 02:14 PM   Modules accepted: Level of Service

## 2024-06-22 ENCOUNTER — Ambulatory Visit: Payer: Medicare Other

## 2024-09-10 ENCOUNTER — Other Ambulatory Visit (HOSPITAL_BASED_OUTPATIENT_CLINIC_OR_DEPARTMENT_OTHER)

## 2024-09-24 ENCOUNTER — Inpatient Hospital Stay

## 2024-09-24 ENCOUNTER — Inpatient Hospital Stay: Admitting: Hematology and Oncology
